# Patient Record
Sex: Female | Born: 1940
Health system: Southern US, Community
[De-identification: ages and names within clinical notes are randomized; demographics above are authoritative.]

## PROBLEM LIST (undated history)

## (undated) DIAGNOSIS — W5501XA Bitten by cat, initial encounter: Secondary | ICD-10-CM

## (undated) DIAGNOSIS — I4891 Unspecified atrial fibrillation: Secondary | ICD-10-CM

## (undated) DIAGNOSIS — G459 Transient cerebral ischemic attack, unspecified: Secondary | ICD-10-CM

## (undated) DIAGNOSIS — G210 Malignant neuroleptic syndrome: Secondary | ICD-10-CM

## (undated) DIAGNOSIS — S81851A Open bite, right lower leg, initial encounter: Secondary | ICD-10-CM

## (undated) DIAGNOSIS — L03119 Cellulitis of unspecified part of limb: Secondary | ICD-10-CM

## (undated) DIAGNOSIS — I10 Essential (primary) hypertension: Secondary | ICD-10-CM

## (undated) DIAGNOSIS — E7800 Pure hypercholesterolemia, unspecified: Secondary | ICD-10-CM

## (undated) HISTORY — DX: Open bite, right lower leg, initial encounter: S81.851A

## (undated) HISTORY — DX: Cellulitis of unspecified part of limb: L03.119

## (undated) HISTORY — DX: Open bite, right lower leg, initial encounter: W55.01XA

## (undated) HISTORY — DX: Essential (primary) hypertension: I10

---

## 2012-11-16 ENCOUNTER — Emergency Department: Payer: Self-pay | Admitting: Unknown Physician Specialty

## 2012-11-16 LAB — CBC WITH DIFFERENTIAL/PLATELET
Basophil #: 0 10*3/uL (ref 0.0–0.1)
Eosinophil %: 0.7 %
HGB: 13.3 g/dL (ref 12.0–16.0)
Lymphocyte #: 1.4 10*3/uL (ref 1.0–3.6)
Lymphocyte %: 12 %
MCHC: 33.5 g/dL (ref 32.0–36.0)
MCV: 92 fL (ref 80–100)
Monocyte %: 7.9 %
Neutrophil %: 79 %
RDW: 13.1 % (ref 11.5–14.5)

## 2012-11-16 LAB — COMPREHENSIVE METABOLIC PANEL WITH GFR
Albumin: 3.6 g/dL
Alkaline Phosphatase: 87 U/L
Anion Gap: 7
BUN: 11 mg/dL
Bilirubin,Total: 0.7 mg/dL
Calcium, Total: 9.4 mg/dL
Chloride: 104 mmol/L
Co2: 26 mmol/L
Creatinine: 0.55 mg/dL — ABNORMAL LOW
EGFR (African American): >60
EGFR (Non-African Amer.): >60
Glucose: 101 mg/dL — ABNORMAL HIGH
Osmolality: 273
Potassium: 3.5 mmol/L
SGOT(AST): 30 U/L
SGPT (ALT): 35 U/L
Sodium: 137 mmol/L
Total Protein: 8.5 g/dL — ABNORMAL HIGH

## 2012-11-22 LAB — CULTURE, BLOOD (SINGLE)

## 2014-12-07 DIAGNOSIS — L03119 Cellulitis of unspecified part of limb: Secondary | ICD-10-CM

## 2014-12-07 HISTORY — DX: Cellulitis of unspecified part of limb: L03.119

## 2015-03-21 ENCOUNTER — Other Ambulatory Visit: Payer: Self-pay | Admitting: Family Medicine

## 2015-03-21 ENCOUNTER — Ambulatory Visit
Admission: RE | Admit: 2015-03-21 | Discharge: 2015-03-21 | Disposition: A | Discharge status: Home / Self Care | Payer: Medicare PPO | Source: Ambulatory Visit | Attending: Family Medicine | Admitting: Family Medicine

## 2015-03-21 DIAGNOSIS — M25562 Pain in left knee: Secondary | ICD-10-CM

## 2015-03-21 IMAGING — CR DG KNEE COMPLETE 4+V*L*
1 series · 5 of 5 positions shown · non-contrast
Comparison: None.

CLINICAL DATA: Left knee pain for more than a year.

EXAM:
LEFT KNEE - COMPLETE 4+ VIEW

[Series 1: ap · 0.17mm/px · 5 of 5 slices shown]
[im 1/5]
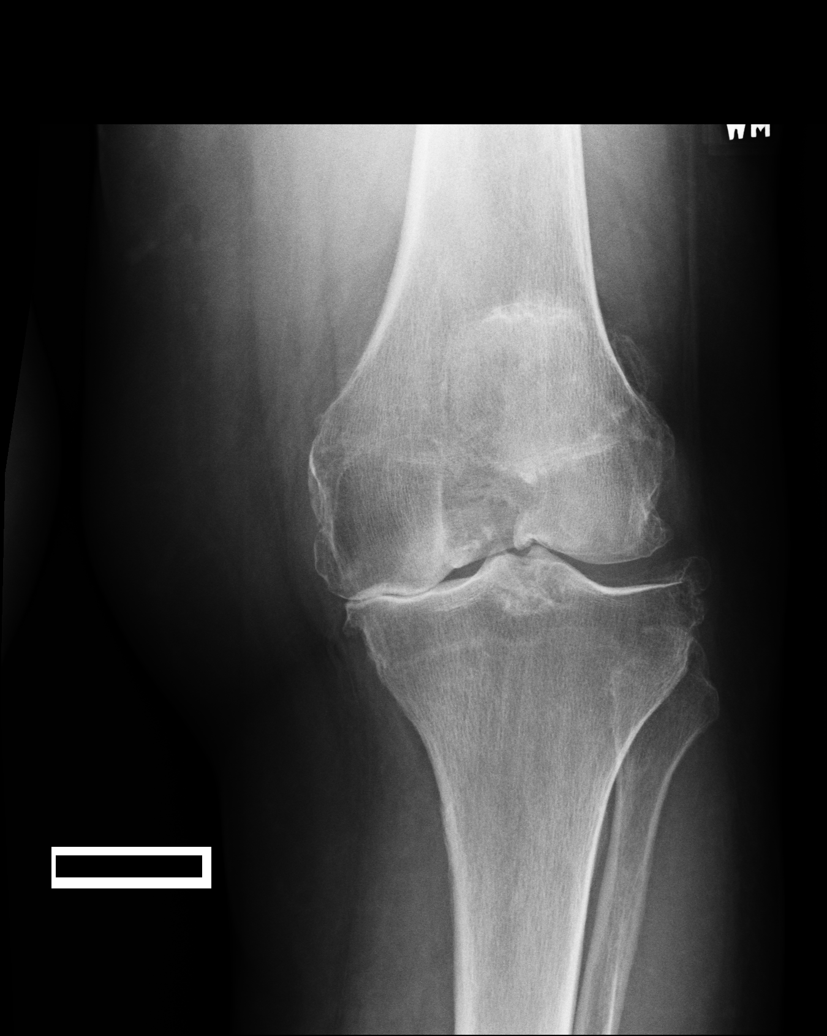
[im 2/5]
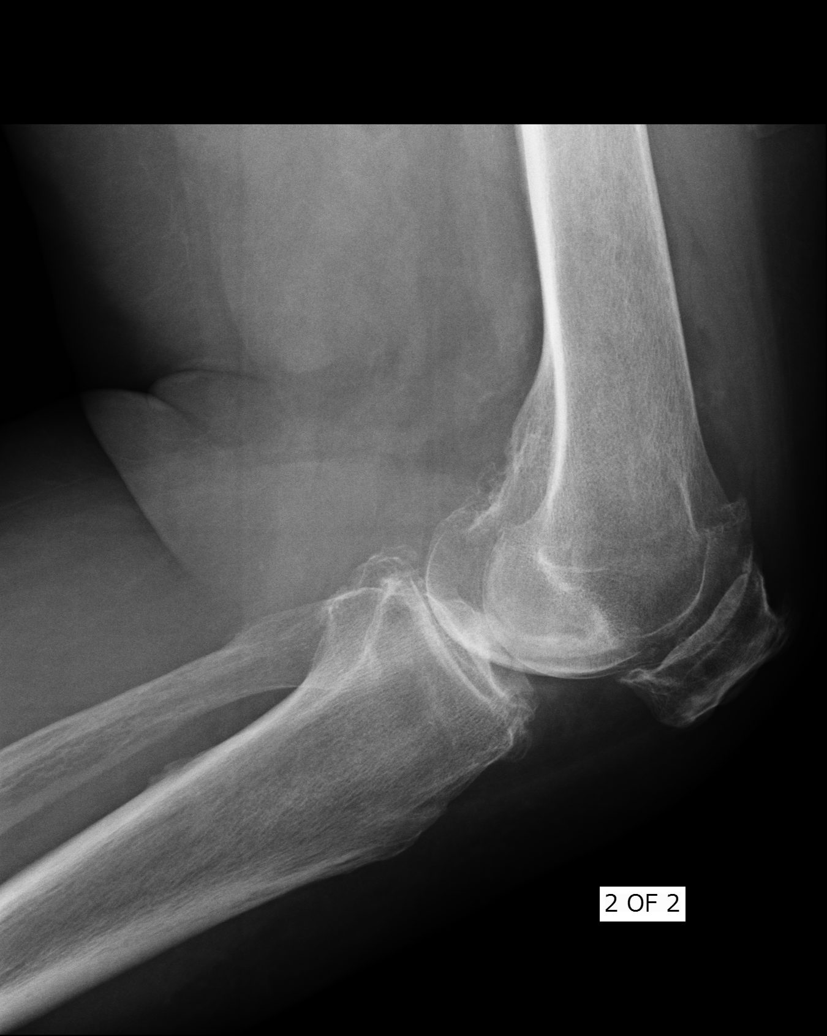
[im 3/5]
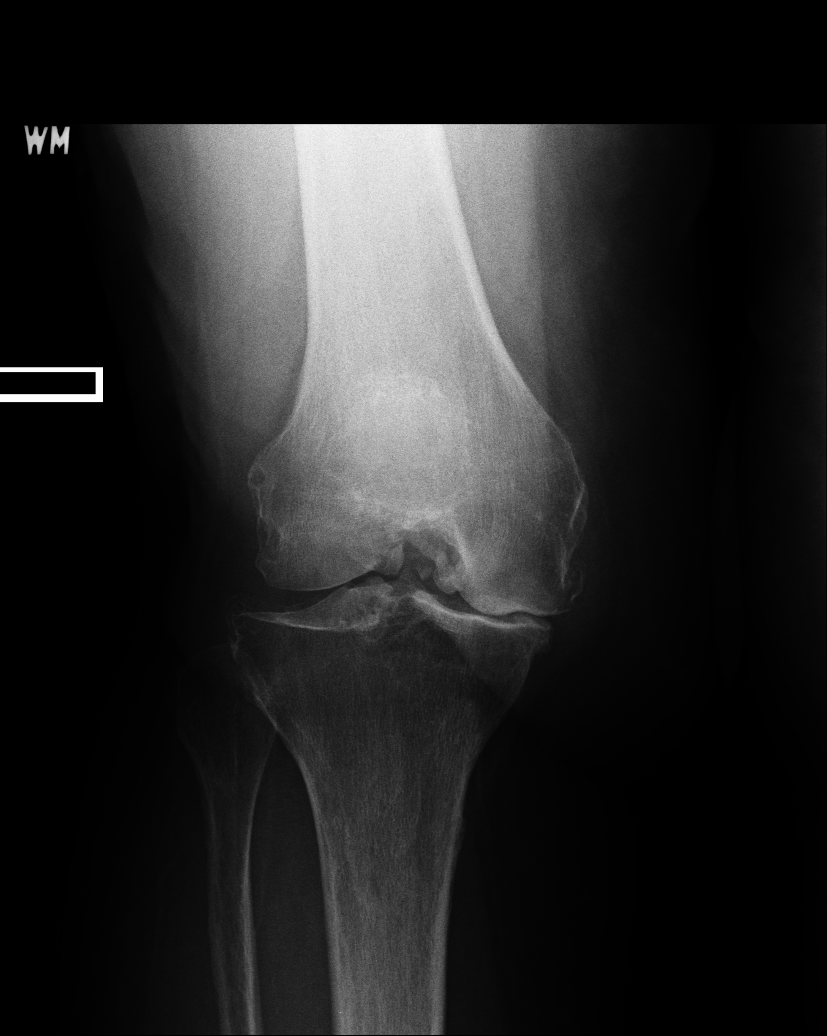
[im 4/5]
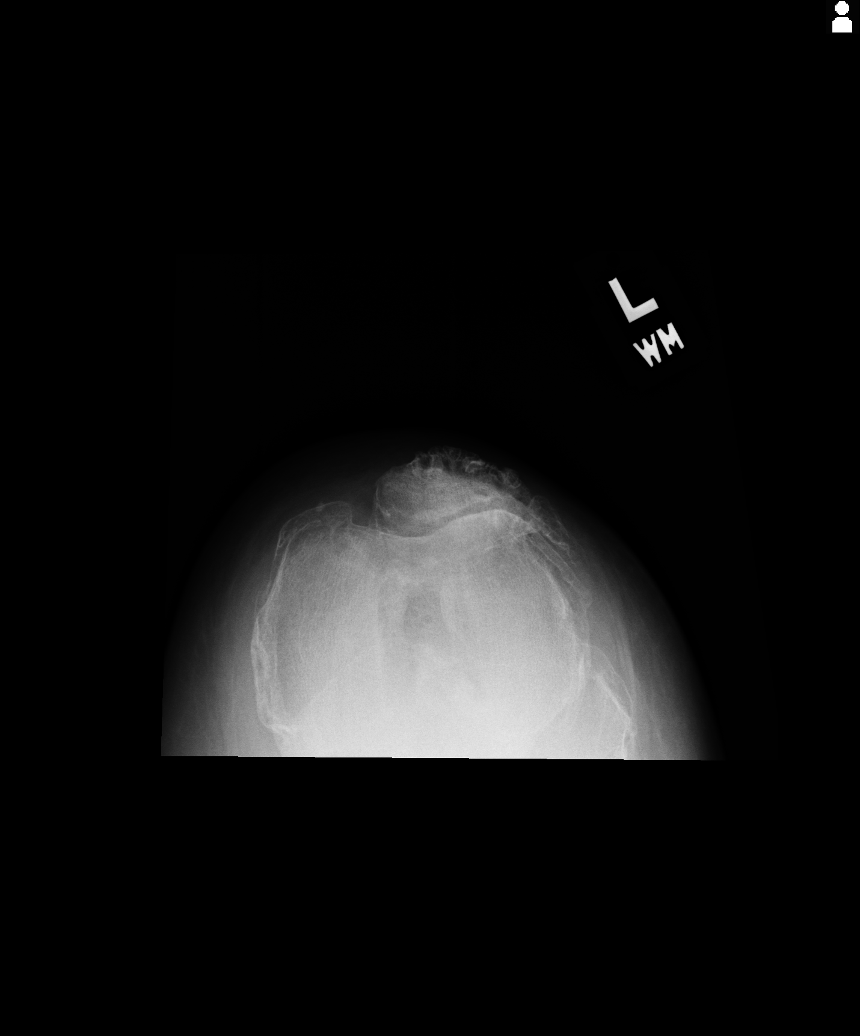
[im 5/5]
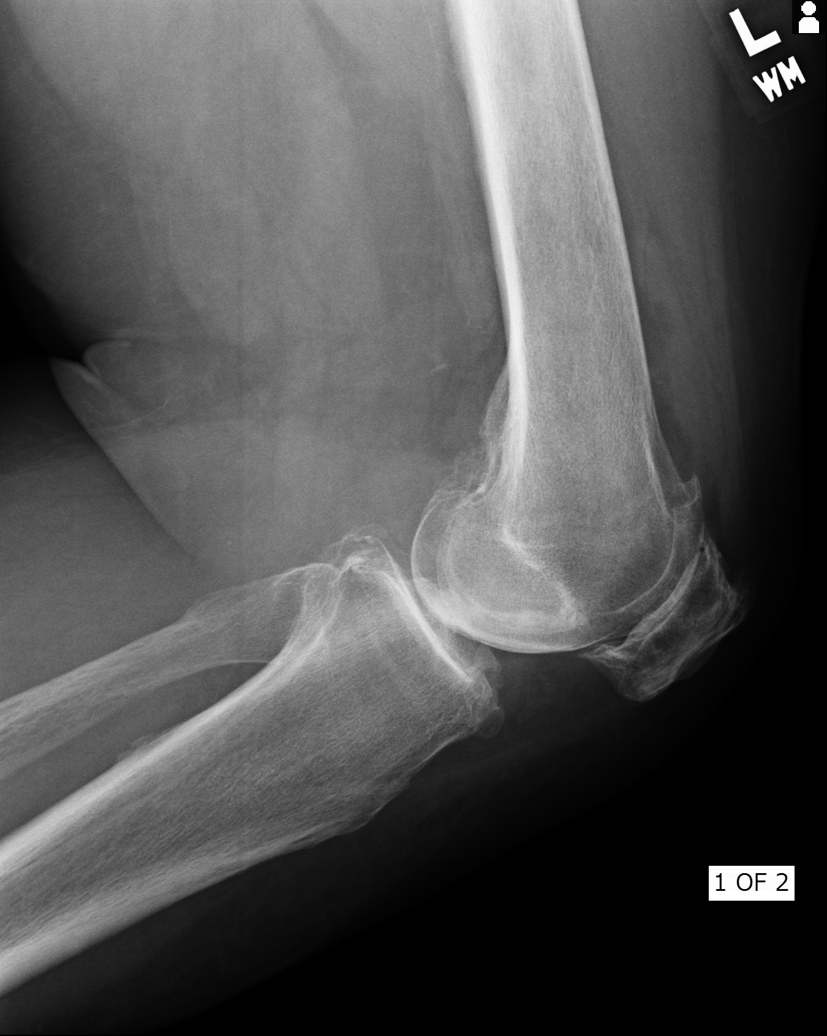

[5 of 5 positions shown; findings below may reference images not displayed]

FINDINGS: No acute fracture or dislocation. Tricompartmental osteoarthritis of
the left knee most severe in the medial femorotibial compartment
with severe joint space narrowing with a bone-on-bone appearance.
Small joint effusion. No lytic or sclerotic osseous lesion.
IMPRESSION: Tricompartmental osteoarthritis of the left knee most severe in the
medial femorotibial compartment.

## 2015-04-11 ENCOUNTER — Ambulatory Visit (INDEPENDENT_AMBULATORY_CARE_PROVIDER_SITE_OTHER): Payer: Medicare PPO | Admitting: Family Medicine

## 2015-04-11 ENCOUNTER — Encounter: Payer: Self-pay | Admitting: Family Medicine

## 2015-04-11 VITALS — BP 170/90 | HR 71 | Temp 98.7°F | Ht 65.6 in | Wt 213.6 lb

## 2015-04-11 DIAGNOSIS — M150 Primary generalized (osteo)arthritis: Secondary | ICD-10-CM | POA: Diagnosis not present

## 2015-04-11 DIAGNOSIS — I10 Essential (primary) hypertension: Secondary | ICD-10-CM | POA: Insufficient documentation

## 2015-04-11 DIAGNOSIS — M159 Polyosteoarthritis, unspecified: Secondary | ICD-10-CM

## 2015-04-11 DIAGNOSIS — M1990 Unspecified osteoarthritis, unspecified site: Secondary | ICD-10-CM | POA: Insufficient documentation

## 2015-04-11 MED ORDER — AMLODIPINE BESYLATE 2.5 MG PO TABS: 2.5000 mg | ORAL_TABLET | Freq: Every day | ORAL | Status: DC

## 2015-04-11 NOTE — Progress Notes (Signed)
BP 178/103 mmHg  Pulse 71  Temp(Src) 98.7 F (37.1 C)  Ht 5' 5.6" (1.666 m)  Wt 213 lb 9.6 oz (96.888 kg)  BMI 34.91 kg/m2  SpO2 97%  LMP  (LMP Unknown)   Subjective:    Patient ID: [name], female    DOB: 1941/09/11, 74 y.o.   MRN: [id number]  HPI: [name] is a 74 y.o. female presenting on 04/11/2015 for Hypertension and Edema  HYPERTENSION Hypertension status: uncontrolled Satisfied with current treatment? yes Duration of hypertension: chronic BP monitoring frequency:  a few times a day BP range: 109N-235T [id number]/27C-62B diastolic BP medication side effects:  no Medication compliance: good compliance Previous BP meds:none, ramipril, spironalactone, tekturna, valsartan, valsartan-HCTZ and verapamil Aspirin: no Recurrent headaches: no Visual changes: no Palpitations: no Dyspnea: no Chest pain: no Lower extremity edema: no Dizzy/lightheaded: no  EDEMA OF RING FINGER- hasn't noticed much of a difference with the diclofenac. Hasn't been in to see the orthopedist. Has ring off her finger and swelling has gotten better. Still having some soreness in the morning and having pain in her hands and her knees  Relevant past medical, surgical, family and social history reviewed and updated as indicated. Interim medical history since our last visit reviewed. Allergies and medications reviewed and updated.  Current Outpatient Prescriptions on File Prior to Visit  Medication Sig  . lisinopril-hydrochlorothiazide (PRINZIDE,ZESTORETIC) 20-25 MG per tablet Take 1 tablet by mouth daily.  . diclofenac sodium (VOLTAREN) 1 % GEL Apply topically 4 (four) times daily.   No current facility-administered medications on file prior to visit.    Review of Systems  Constitutional: Negative.   Respiratory: Negative.   Cardiovascular: Negative.   Gastrointestinal: Negative.   Musculoskeletal: Negative.   Skin: Negative.     Per HPI unless specifically indicated above      Objective:    BP 178/103 mmHg  Pulse 71  Temp(Src) 98.7 F (37.1 C)  Ht 5' 5.6" (1.666 m)  Wt 213 lb 9.6 oz (96.888 kg)  BMI 34.91 kg/m2  SpO2 97%  LMP  (LMP Unknown) Repeat BP: 170/90 Wt Readings from Last 3 Encounters:  04/11/15 213 lb 9.6 oz (96.888 kg)  03/21/15 204 lb (92.534 kg)  03/21/15 204 lb (92.534 kg)    Physical Exam  Constitutional: She is oriented to person, place, and time. She appears well-developed and well-nourished.  HENT:  Head: Normocephalic and atraumatic.  Eyes: Conjunctivae and EOM are normal. Pupils are equal, round, and reactive to light.  Neck: Normal range of motion. Neck supple.  Cardiovascular: Normal rate, regular rhythm, normal heart sounds and intact distal pulses.  Exam reveals no gallop and no friction rub.   No murmur heard. Pulmonary/Chest: Effort normal and breath sounds normal. No respiratory distress. She has no wheezes. She has no rales. She exhibits no tenderness.  Musculoskeletal:  Some mild tenderness over the MCP joint of the 4th finger on the L hand  Neurological: She is alert and oriented to person, place, and time.  Skin: Skin is warm and dry.  Psychiatric: She has a normal mood and affect. Her behavior is normal. Judgment normal.  Nursing note and vitals reviewed.     Assessment & Plan:   Problem List Items Addressed This Visit    HTN (hypertension) - Primary    Still very elevated. Seems to be better at home, but unclear if this is accurate as she hasn't brought in her cuff. Will start her on low dose amlodipine  and have her come back in 2 weeks for recheck. If she gets dizzy on that medicine, she will stop it and call.       Relevant Medications   amLODipine (NORVASC) 2.5 MG tablet   Osteoarthritis    Continue voltaren gel as needed for her arthritis. Referral to orthopedist made previously, which she will keep. Continue to monitor.            Follow up plan: Return in about 2 weeks (around  04/25/2015).

## 2015-04-11 NOTE — Assessment & Plan Note (Signed)
Still very elevated. Seems to be better at home, but unclear if this is accurate as she hasn't brought in her cuff. Will start her on low dose amlodipine and have her come back in 2 weeks for recheck. If she gets dizzy on that medicine, she will stop it and call.

## 2015-04-11 NOTE — Assessment & Plan Note (Signed)
Continue voltaren gel as needed for her arthritis. Referral to orthopedist made previously, which she will keep. Continue to monitor.

## 2015-04-11 NOTE — Patient Instructions (Addendum)
-  Start taking the amlodipine with the lisinopril-hctz. If you start feeling dizzy, stop the amlodipine and call me.  -Stop eating so much soup because of the sodium  Hypertension Hypertension is another name for high blood pressure. High blood pressure forces your heart to work harder to pump blood. A blood pressure reading has two numbers, which includes a higher number over a lower number (example: 110/72). HOME CARE   Have your blood pressure rechecked by your doctor.  Only take medicine as told by your doctor. Follow the directions carefully. The medicine does not work as well if you skip doses. Skipping doses also puts you at risk for problems.  Do not smoke.  Monitor your blood pressure at home as told by your doctor. GET HELP IF:  You think you are having a reaction to the medicine you are taking.  You have repeat headaches or feel dizzy.  You have puffiness (swelling) in your ankles.  You have trouble with your vision. GET HELP RIGHT AWAY IF:   You get a very bad headache and are confused.  You feel weak, numb, or faint.  You get chest or belly (abdominal) pain.  You throw up (vomit).  You cannot breathe very well. MAKE SURE YOU:   Understand these instructions.  Will watch your condition.  Will get help right away if you are not doing well or get worse. Document Released: 04/08/2008 Document Revised: 10/26/2013 Document Reviewed: 08/13/2013 [facility] Patient Information 2015 [place], [place]. This information is not intended to replace advice given to you by your health care provider. Make sure you discuss any questions you have with your health care provider.

## 2015-04-21 ENCOUNTER — Telehealth: Payer: Self-pay

## 2015-04-21 NOTE — Telephone Encounter (Signed)
Patient takes her "salt" pill in the morning, the last couple of days she has been groggy and having a headache.  Drinking "more than one" sprite has helped.  She thinks it may be the pill and doesn't want to take it anymore

## 2015-04-21 NOTE — Telephone Encounter (Signed)
Called patient and told her to keep taking her blood pressure medicine and we'll recheck her blood pressure on Tuesday at her appointment.

## 2015-04-25 ENCOUNTER — Encounter: Payer: Self-pay | Admitting: Family Medicine

## 2015-04-25 ENCOUNTER — Ambulatory Visit (INDEPENDENT_AMBULATORY_CARE_PROVIDER_SITE_OTHER): Payer: Medicare PPO | Admitting: Family Medicine

## 2015-04-25 VITALS — BP 184/100 | HR 67 | Temp 98.6°F | Ht 66.2 in | Wt 201.0 lb

## 2015-04-25 DIAGNOSIS — M79645 Pain in left finger(s): Secondary | ICD-10-CM | POA: Diagnosis not present

## 2015-04-25 DIAGNOSIS — I10 Essential (primary) hypertension: Secondary | ICD-10-CM

## 2015-04-25 NOTE — Assessment & Plan Note (Signed)
Appears to be some arthritis. Will use the volterin gel on her finger

## 2015-04-25 NOTE — Assessment & Plan Note (Signed)
Still not under good control. Patient has low numbers at home in the 100s, and has had some symptomatic hypotension on higher doses previously. Very labile BP per patient readings at home. Will refer to cardiology for further evaluation and recommendations regarding BP treatment.

## 2015-04-25 NOTE — Patient Instructions (Signed)

## 2015-04-25 NOTE — Progress Notes (Signed)
BP 184/100 mmHg  Pulse 67  Temp(Src) 98.6 F (37 C)  Ht 5' 6.2" (1.681 m)  Wt 201 lb (91.173 kg)  BMI 32.26 kg/m2  SpO2 99%  LMP  (Approximate)   Subjective:    Patient ID: [name], female    DOB: 02-20-1941, 74 y.o.   MRN: [id number]  HPI: [name] is a 74 y.o. female  Chief Complaint  Patient presents with  . Hypertension   HYPERTENSION- feeling like her stomach has been off and she has not been feeling all that well.  Hypertension status: uncontrolled Satisfied with current treatment? no Duration of hypertension: chronic BP monitoring frequency:  a few times a day BP range:  BP medication side effects:  no Medication compliance: good compliance Aspirin: no Recurrent headaches: yes Visual changes: no Palpitations: no Dyspnea: no Chest pain: no Lower extremity edema: no Dizzy/lightheaded: no  Had been feeling sick for a couple of days after she ate breakfast- but has been feeling better for about 2-3 days and now is feeling fine.   Relevant past medical, surgical, family and social history reviewed and updated as indicated. Interim medical history since our last visit reviewed. Allergies and medications reviewed and updated.  Review of Systems  Constitutional: Negative.   Respiratory: Negative.   Cardiovascular: Negative.   Gastrointestinal: Positive for nausea. Negative for vomiting, abdominal pain, diarrhea, constipation, blood in stool and anal bleeding.  Musculoskeletal: Negative.   Skin: Negative.   Psychiatric/Behavioral: Negative.    Per HPI unless specifically indicated above    Objective:    BP 184/100 mmHg  Pulse 67  Temp(Src) 98.6 F (37 C)  Ht 5' 6.2" (1.681 m)  Wt 201 lb (91.173 kg)  BMI 32.26 kg/m2  SpO2 99%  LMP  (Approximate)  Wt Readings from Last 3 Encounters:  04/25/15 201 lb (91.173 kg)  04/11/15 213 lb 9.6 oz (96.888 kg)  03/21/15 204 lb (92.534 kg)    Physical Exam  Constitutional: She is oriented to person, place,  and time. She appears well-developed and well-nourished. No distress.  HENT:  Head: Normocephalic and atraumatic.  Eyes: Conjunctivae and EOM are normal. Pupils are equal, round, and reactive to light. Right eye exhibits no discharge. Left eye exhibits no discharge. No scleral icterus.  Cardiovascular: Normal rate, regular rhythm and normal heart sounds.  Exam reveals no gallop and no friction rub.   No murmur heard. Pulmonary/Chest: Effort normal and breath sounds normal. No respiratory distress. She has no wheezes. She has no rales. She exhibits no tenderness.  Musculoskeletal:  Some slight swelling over the L ring finger  Neurological: She is alert and oriented to person, place, and time.  Skin: Skin is warm and dry. No rash noted. She is not diaphoretic. No erythema. No pallor.  Psychiatric: She has a normal mood and affect. Her behavior is normal. Judgment normal.  Nursing note and vitals reviewed.       Assessment & Plan:   Problem List Items Addressed This Visit      Cardiovascular and Mediastinum   HTN (hypertension) - Primary    Still not under good control. Patient has low numbers at home in the 100s, and has had some symptomatic hypotension on higher doses previously. Very labile BP per patient readings at home. Will refer to cardiology for further evaluation and recommendations regarding BP treatment.       Relevant Orders   Ambulatory referral to Cardiology     Other   Finger pain,  left    Appears to be some arthritis. Will use the volterin gel on her finger          Follow up plan: Return in about 3 months (around 07/26/2015).

## 2015-04-26 ENCOUNTER — Telehealth: Payer: Self-pay

## 2015-04-26 NOTE — Telephone Encounter (Signed)
Patient called and wanted to know if she had to continue to take both of her blood pressure medications, since she is going to Cardiology, but not until August. She states that the newer one makes her sick. After reading over Dr.Johnson's note I advised her to continue both medications until she is seen at cardiology since her BP has been so high in the office.

## 2015-06-15 ENCOUNTER — Ambulatory Visit (INDEPENDENT_AMBULATORY_CARE_PROVIDER_SITE_OTHER): Payer: Medicare PPO | Admitting: Cardiovascular Disease

## 2015-06-15 ENCOUNTER — Encounter: Payer: Self-pay | Admitting: Cardiovascular Disease

## 2015-06-15 VITALS — BP 150/80 | HR 82 | Ht 67.0 in | Wt 197.2 lb

## 2015-06-15 DIAGNOSIS — M7989 Other specified soft tissue disorders: Secondary | ICD-10-CM | POA: Diagnosis not present

## 2015-06-15 DIAGNOSIS — R21 Rash and other nonspecific skin eruption: Secondary | ICD-10-CM | POA: Diagnosis not present

## 2015-06-15 DIAGNOSIS — I4891 Unspecified atrial fibrillation: Secondary | ICD-10-CM | POA: Diagnosis not present

## 2015-06-15 DIAGNOSIS — I10 Essential (primary) hypertension: Secondary | ICD-10-CM | POA: Diagnosis not present

## 2015-06-15 MED ORDER — RIVAROXABAN 20 MG PO TABS: 20.0000 mg | ORAL_TABLET | Freq: Every day | ORAL | Status: DC

## 2015-06-15 MED ORDER — METOPROLOL SUCCINATE ER 50 MG PO TB24: 50.0000 mg | ORAL_TABLET | Freq: Every day | ORAL | Status: DC

## 2015-06-15 MED ORDER — LOSARTAN POTASSIUM 100 MG PO TABS: 100.0000 mg | ORAL_TABLET | Freq: Every day | ORAL | Status: DC

## 2015-06-15 NOTE — Assessment & Plan Note (Signed)
She reports worsening leg swelling. Unclear if this is from atrial fibrillation and diastolic heart failure. She does have significant by mouth fluid intake. Unable to exclude worsening swelling from amlodipine. Recommended she hold amlodipine for now, try to decrease her fluid intake We will hold off on Lasix for now. She denies any shortness of breath with exertion

## 2015-06-15 NOTE — Patient Instructions (Addendum)
Please stop the lisinopril HCT Start losartan one pill a day for blood pressure  Stop the amlodipine Start metoprolol 50 mg once a day   Start xarelto one a day for atrial fibrillation (blood thinner)  We will schedule you for an echocardiogram for atrial fibrillation    Please call us if you have new issues that need to be addressed before your next appt.  Your physician wants you to follow-up in: 1 month.  Echocardiogram An echocardiogram, or echocardiography, uses sound waves (ultrasound) to produce an image of your heart. The echocardiogram is simple, painless, obtained within a short period of time, and offers valuable information to your health care provider. The images from an echocardiogram can provide information such as:  Evidence of coronary artery disease (CAD).  Heart size.  Heart muscle function.  Heart valve function.  Aneurysm detection.  Evidence of a past heart attack.  Fluid buildup around the heart.  Heart muscle thickening.  Assess heart valve function. LET [facility] CARE PROVIDER KNOW ABOUT:  Any allergies you have.  All medicines you are taking, including vitamins, herbs, eye drops, creams, and over-the-counter medicines.  Previous problems you or members of your family have had with the use of anesthetics.  Any blood disorders you have.  Previous surgeries you have had.  Medical conditions you have.  Possibility of pregnancy, if this applies. BEFORE THE PROCEDURE  No special preparation is needed. Eat and drink normally.  PROCEDURE   In order to produce an image of your heart, gel will be applied to your chest and a wand-like tool (transducer) will be moved over your chest. The gel will help transmit the sound waves from the transducer. The sound waves will harmlessly bounce off your heart to allow the heart images to be captured in real-time motion. These images will then be recorded.  You may need an IV to receive a medicine that  improves the quality of the pictures. AFTER THE PROCEDURE You may return to your normal schedule including diet, activities, and medicines, unless your health care provider tells you otherwise. Document Released: 10/18/2000 Document Revised: 03/07/2014 Document Reviewed: 06/28/2013 [facility] Patient Information 2015 [place], [place]. This information is not intended to replace advice given to you by your health care provider. Make sure you discuss any questions you have with your health care provider.

## 2015-06-15 NOTE — Progress Notes (Signed)
  Primary care physician:Dr. Nelle Don  HPI  This is a pleasant 74 year old female who was referred for evaluation of labile hypertension.  No Known Allergies   Current Outpatient Prescriptions on File Prior to Visit  Medication Sig Dispense Refill  . amLODipine (NORVASC) 2.5 MG tablet Take 1 tablet (2.5 mg total) by mouth daily. 1 tablet 6  . diclofenac sodium (VOLTAREN) 1 % GEL Apply topically 4 (four) times daily.    Marland Kitchen lisinopril-hydrochlorothiazide (PRINZIDE,ZESTORETIC) 20-25 MG per tablet Take 1 tablet by mouth daily.     No current facility-administered medications on file prior to visit.     Past Medical History  Diagnosis Date  . Hypertension   . Cellulitis of lower leg 12/07/2014  . Cat bite of right lower leg [id number]     History reviewed. No pertinent past surgical history.   Family History  Problem Relation Age of Onset  . Hypertension Mother   . Alcohol abuse Father   . Heart disease Father   . Hypertension Father   . Hyperlipidemia Sister   . Hypertension Sister   . Alcohol abuse Brother   . Hypertension Brother      Social History   Social History  . Marital Status: Married    Spouse Name: N/A  . Number of Children: N/A  . Years of Education: N/A   Occupational History  . Not on file.   Social History Main Topics  . Smoking status: Former Smoker    Quit date: 11/04/1978  . Smokeless tobacco: Never Used  . Alcohol Use: No  . Drug Use: No  . Sexual Activity: No   Other Topics Concern  . Not on file   Social History Narrative     ROS   PHYSICAL EXAM   Ht 5\' 7"  (1.702 m)  Wt 197 lb 4 oz (89.472 kg)  BMI 30.89 kg/m2  LMP  (Approximate)   EKG:   ASSESSMENT AND PLAN

## 2015-06-15 NOTE — Assessment & Plan Note (Signed)
Medication changes as above. We will hold the lisinopril HCTZ and amlodipine. We will start losartan and metoprolol Recommended she monitor her blood pressure at home

## 2015-06-15 NOTE — Progress Notes (Signed)
Patient ID: [name], female    DOB: 27-Nov-1940, 74 y.o.   MRN: [id number]  HPI Comments: Ms. Soyars is a 74 year old woman, patient of Dr. Wynetta Emery with history of labile blood pressure who presents for evaluation of her hypertension.  On her visit today, she reports that for the past several weeks she has had nausea, upset stomach. She has been drinking 6 sprites in the morning in addition to her coffee. This tends to settle her stomach. She does report some significant itching of her arms and legs. She blames this on a cat. Arms are red, excoriated as are the legs below the knees, worse on the right. She feels it is her "salt pill" causing her problems. This is the lisinopril HCT.  She denies any palpitations, shortness of breath, chest discomfort.  EKG on today's visit shows atrial fibrillation with ventricular rate 82 bpm, right bundle branch block.  This is new to her, never had EKG in the past  In general she tries to avoid doctors, does not like to take medications. Prefers to do everything with her diet.  Remote history of smoking, not for 30 years Husband died several years ago No regular exercise program Reports her blood pressure is labile, sometimes low at home      No Known Allergies  Outpatient Encounter Prescriptions as of 06/15/2015  Medication Sig  . amLODipine (NORVASC) 2.5 MG tablet Take 1 tablet (2.5 mg total) by mouth daily.  Marland Kitchen  lisinopril-hydrochlorothiazide (PRINZIDE,ZESTORETIC) 20-25 MG per tablet Take 1 tablet by mouth daily.    Past Medical History  Diagnosis Date  . Hypertension   . Cellulitis of lower leg 12/07/2014  . Cat bite of right lower leg [id number]    History reviewed. No pertinent past surgical history.  Social History  reports that she quit smoking about 36 years ago. She has never used smokeless tobacco. She reports that she does not drink alcohol or use illicit drugs.  Family History family history includes Alcohol abuse in her  brother and father; Heart disease in her father; Hyperlipidemia in her sister; Hypertension in her brother, father, mother, and sister.   Review of Systems  Constitutional: Negative.   Respiratory: Negative.   Cardiovascular: Positive for leg swelling.  Gastrointestinal: Positive for nausea.  Musculoskeletal: Negative.   Skin: Positive for rash.  Neurological: Negative.   Hematological: Negative.   Psychiatric/Behavioral: Negative.   All other systems reviewed and are negative.   BP 150/80 mmHg  Pulse 82  Ht 5\' 7"  (1.702 m)  Wt 197 lb 4 oz (89.472 kg)  BMI 30.89 kg/m2  LMP  (Approximate)  Physical Exam  Constitutional: She is oriented to person, place, and time. She appears well-developed and well-nourished.  HENT:  Head: Normocephalic.  Nose: Nose normal.  Mouth/Throat: Oropharynx is clear and moist.  Eyes: Conjunctivae are normal. Pupils are equal, round, and reactive to light.  Neck: Normal range of motion. Neck supple. No JVD present.  Cardiovascular: Normal rate, regular rhythm, normal heart sounds and intact distal pulses.  Exam reveals no gallop and no friction rub.   No murmur heard. Pulmonary/Chest: Effort normal and breath sounds normal. No respiratory distress. She has no wheezes. She has no rales. She exhibits no tenderness.  Abdominal: Soft. Bowel sounds are normal. She exhibits no distension. There is no tenderness.  Musculoskeletal: Normal range of motion. She exhibits no edema or tenderness.  Lymphadenopathy:    She has no cervical adenopathy.  Neurological: She is alert  and oriented to person, place, and time. Coordination normal.  Skin: Skin is warm and dry. No rash noted. No erythema.  Psychiatric: She has a normal mood and affect. Her behavior is normal. Judgment and thought content normal.

## 2015-06-15 NOTE — Assessment & Plan Note (Signed)
EKG on today's visit documenting atrial fibrillation Ventricular rate not particularly elevated but we will add metoprolol succinate 50 mg daily  Also recommended she start anticoagulation with  Xarelto 20 mg daily  The hope would be for one month of anticoagulation and consider an attempt to restore normal sinus rhythm . Unclear how long she has been in atrial fibrillation . Echocardiogram has been ordered to rule out structural heart disease, valve regurgitation and to estimate size of left atrium as well as right heart pressures

## 2015-06-15 NOTE — Assessment & Plan Note (Signed)
Diffuse rash on her arms and legs. Unable to exclude sulfa allergy from HCTZ. She does report having worsening rash since she has been taking her "salt pill". Recommended she stop the lisinopril HCTZ. We will start losartan 100 mg daily

## 2015-06-23 ENCOUNTER — Ambulatory Visit (INDEPENDENT_AMBULATORY_CARE_PROVIDER_SITE_OTHER): Payer: Medicare PPO

## 2015-06-23 ENCOUNTER — Other Ambulatory Visit: Payer: Self-pay

## 2015-06-23 DIAGNOSIS — I4891 Unspecified atrial fibrillation: Secondary | ICD-10-CM

## 2015-07-27 ENCOUNTER — Ambulatory Visit (INDEPENDENT_AMBULATORY_CARE_PROVIDER_SITE_OTHER): Payer: Medicare PPO | Admitting: Family Medicine

## 2015-07-27 ENCOUNTER — Encounter: Payer: Self-pay | Admitting: Family Medicine

## 2015-07-27 VITALS — BP 168/80 | HR 73 | Temp 99.2°F | Wt 199.0 lb

## 2015-07-27 DIAGNOSIS — I4891 Unspecified atrial fibrillation: Secondary | ICD-10-CM

## 2015-07-27 DIAGNOSIS — R21 Rash and other nonspecific skin eruption: Secondary | ICD-10-CM | POA: Diagnosis not present

## 2015-07-27 DIAGNOSIS — I10 Essential (primary) hypertension: Secondary | ICD-10-CM

## 2015-07-27 NOTE — Assessment & Plan Note (Signed)
Significantly improve, but she notes that it comes back and goes away. Several cats at home. Potential environmental allergy or due to HCTZ, which has been stopped. Continue to monitor.

## 2015-07-27 NOTE — Patient Instructions (Signed)
Atrial Fibrillation  Atrial fibrillation is a condition that causes your heart to beat irregularly. It may also cause your heart to beat faster than normal. Atrial fibrillation can prevent your heart from pumping blood normally. It increases your risk of stroke and heart problems.  HOME CARE  · Take medications as told by your doctor.  · Only take medications that your doctor says are safe. Some medications can make the condition worse or happen again.  · If blood thinners were prescribed by your doctor, take them exactly as told. Too much can cause bleeding. Too little and you will not have the needed protection against stroke and other problems.  · Perform blood tests at home if told by your doctor.  · Perform blood tests exactly as told by your doctor.  · Do not drink alcohol.  · Do not drink beverages with caffeine such as coffee, soda, and some teas.  · Maintain a healthy weight.  · Do not use diet pills unless your doctor says they are safe. They may make heart problems worse.  · Follow diet instructions as told by your doctor.  · Exercise regularly as told by your doctor.  · Keep all follow-up appointments.  GET HELP IF:  · You notice a change in the speed, rhythm, or strength of your heartbeat.  · You suddenly begin peeing (urinating) more often.  · You get tired more easily when moving or exercising.  GET HELP RIGHT AWAY IF:   · You have chest or belly (abdominal) pain.  · You feel sick to your stomach (nauseous).  · You are short of breath.  · You suddenly have swollen feet and ankles.  · You feel dizzy.  · You face, arms, or legs feel numb or weak.  · There is a change in your vision or speech.  MAKE SURE YOU:   · Understand these instructions.  · Will watch your condition.  · Will get help right away if you are not doing well or get worse.  Document Released: 07/30/2008 Document Revised: 03/07/2014 Document Reviewed: 12/01/2012  [employer]® Patient Information ©2015 [employer]. This information is not  intended to replace advice given to you by your health care provider. Make sure you discuss any questions you have with your health care provider.

## 2015-07-27 NOTE — Assessment & Plan Note (Signed)
Discussed this with patient. She did not remember that Dr. Rockey Situ had diagnosed her with this. In fib again today. Continue to follow with Dr. Rockey Situ. Continue to monitor.

## 2015-07-27 NOTE — Assessment & Plan Note (Addendum)
BP continues to be labile. Has been doing well at home in the 120s-130s/80s, but here it is high again. Due to see Dr. Rockey Situ on 08/11/15- Will hold on changing any medicine and see what he recommends. Continue to monitor. Encouraged her to follow up with him. She will bring her monitor and log when she goes to see him.

## 2015-07-27 NOTE — Progress Notes (Signed)
BP 168/80 mmHg  Pulse 73  Temp(Src) 99.2 F (37.3 C)  Wt 199 lb (90.266 kg)  SpO2 98%  LMP  (Approximate)   Subjective:    Patient ID: [name], female    DOB: [birth year], 74 y.o.   MRN: [id number]  HPI: [name] is a 74 y.o. female  Chief Complaint  Patient presents with  . Hypertension   HYPERTENSION- saw Dr. Rockey Situ in August and diagnosed with A. Fib. He has been working on getting her BP under control.  Hypertension status: uncontrolled  Satisfied with current treatment? yes Duration of hypertension: chronic BP monitoring frequency:  a few times a day BP range: 110s-130s/80s BP medication side effects:  no Medication compliance: excellent compliance Aspirin: no Recurrent headaches: no Visual changes: no Palpitations: no Dyspnea: no Chest pain: no Lower extremity edema: no Dizzy/lightheaded: no  Has been feeling better on her stomach since she has come off her HCTZ. Has not been feeling nauseous or dizzy.   ATRIAL FIBRILLATION Atrial fibrillation status: stable Satisfied with current treatment: yes  Medication side effects:  no Medication compliance: excellent compliance Palpitations:  no Chest pain:  no Dyspnea on exertion:  no Orthopnea:  no Syncope:  no Edema:  yes Ventricular rate control: B-blocker Anti-coagulation: long acting   Relevant past medical, surgical, family and social history reviewed and updated as indicated. Interim medical history since our last visit reviewed. Allergies and medications reviewed and updated.  Review of Systems  Constitutional: Negative.   Respiratory: Negative.   Cardiovascular: Negative.   Musculoskeletal: Negative.   Skin: Negative.  Negative for color change, pallor, rash and wound.  Psychiatric/Behavioral: Negative.     Per HPI unless specifically indicated above     Objective:    BP 168/80 mmHg  Pulse 73  Temp(Src) 99.2 F (37.3 C)  Wt 199 lb (90.266 kg)  SpO2 98%  LMP  (Approximate)  Wt  Readings from Last 3 Encounters:  07/27/15 199 lb (90.266 kg)  06/15/15 197 lb 4 oz (89.472 kg)  04/25/15 201 lb (91.173 kg)    Physical Exam  Constitutional: She is oriented to person, place, and time. She appears well-developed and well-nourished. No distress.  HENT:  Head: Normocephalic and atraumatic.  Right Ear: Hearing normal.  Left Ear: Hearing normal.  Nose: Nose normal.  Eyes: Conjunctivae and lids are normal. Right eye exhibits no discharge. Left eye exhibits no discharge. No scleral icterus.  Cardiovascular: Normal rate, normal heart sounds and intact distal pulses.  An irregularly irregular rhythm present. Exam reveals no gallop and no friction rub.   No murmur heard. Pulmonary/Chest: Effort normal and breath sounds normal. No respiratory distress. She has no wheezes. She has no rales. She exhibits no tenderness.  Musculoskeletal: Normal range of motion.  1+ edema bilaterally, excoriation on R calf  Neurological: She is alert and oriented to person, place, and time.  Skin: Skin is warm, dry and intact. No rash noted. No erythema. No pallor.  Psychiatric: She has a normal mood and affect. Her speech is normal and behavior is normal. Judgment and thought content normal. Cognition and memory are normal.  Nursing note and vitals reviewed.     Assessment & Plan:   Problem List Items Addressed This Visit      Cardiovascular and Mediastinum   HTN (hypertension) - Primary    BP continues to be labile. Has been doing well at home in the 120s-130s/80s, but here it is high again. Due to see  Dr. Rockey Situ on 08/11/15- Will hold on changing any medicine and see what he recommends. Continue to monitor. Encouraged her to follow up with him. She will bring her monitor and log when she goes to see him.       Atrial fibrillation, unspecified    Discussed this with patient. She did not remember that Dr. Rockey Situ had diagnosed her with this. In fib again today. Continue to follow with Dr. Rockey Situ.  Continue to monitor.         Musculoskeletal and Integument   Rash    Significantly improve, but she notes that it comes back and goes away. Several cats at home. Potential environmental allergy or due to HCTZ, which has been stopped. Continue to monitor.           Follow up plan: Return in about 3 months (around 10/26/2015).

## 2015-08-11 ENCOUNTER — Encounter: Payer: Self-pay | Admitting: Cardiovascular Disease

## 2015-08-11 ENCOUNTER — Ambulatory Visit (INDEPENDENT_AMBULATORY_CARE_PROVIDER_SITE_OTHER): Payer: Medicare PPO | Admitting: Cardiovascular Disease

## 2015-08-11 VITALS — BP 170/74 | HR 65 | Ht 67.0 in | Wt 195.0 lb

## 2015-08-11 DIAGNOSIS — R21 Rash and other nonspecific skin eruption: Secondary | ICD-10-CM

## 2015-08-11 DIAGNOSIS — I10 Essential (primary) hypertension: Secondary | ICD-10-CM | POA: Diagnosis not present

## 2015-08-11 DIAGNOSIS — I4891 Unspecified atrial fibrillation: Secondary | ICD-10-CM | POA: Diagnosis not present

## 2015-08-11 DIAGNOSIS — M7989 Other specified soft tissue disorders: Secondary | ICD-10-CM

## 2015-08-11 NOTE — Assessment & Plan Note (Signed)
She remains in atrial fibrillation. Rate relatively well-controlled, now on anticoagulation Given her severely dilated left atrium and she is asymptomatic, will not try to restore normal sinus rhythm as she would be high risk of converting back to atrial fibrillation

## 2015-08-11 NOTE — Assessment & Plan Note (Signed)
Rash improved by holding lisinopril HCTZ I suspect she had a sulfa allergy to the HCTZ Tolerating losartan We'll try to avoid diuretics. Same problem could happen with Lasix

## 2015-08-11 NOTE — Assessment & Plan Note (Signed)
Leg swelling improved by holding amlodipine Still with chronic venous insufficiency and varicose veins. Recommended compression hose

## 2015-08-11 NOTE — Assessment & Plan Note (Signed)
Blood pressure is elevated today. She reports it is better at home but she does not know any of the numbers Recommended she closely monitor her blood pressure at home Ideal blood pressure range provided to her and she will monitor this and call our office if he continues to run high We'll avoid calcium channel blockers, HCTZ

## 2015-08-11 NOTE — Progress Notes (Signed)
Patient ID: [name], female    DOB: 1941-03-11, 74 y.o.   MRN: [id number]  HPI Comments: [name] is a 74 year old woman, patient of Dr. Wynetta Emery with history of labile blood pressure who presents for evaluation of her hypertension and atrial fibrillation.   In follow-up today, she reports that she is doing well. On her last clinic visit, several medication changes were made including holding amlodipine for leg edema, holding lisinopril HCTZ for rash. She was started on anticoagulation, xarelto She was started on losartan She reports that her rash has resolved, leg edema has significantly improved Still with chronic venous insufficiency, varicose veins She reports blood pressure at home is "within normal range" but she does not know the numbers. She did not bring her blood pressure cuff with her today Denies any significant shortness of breath or chest tightness  Echocardiogram done recently showing severely dilated left atrium, elevated right ventricular systolic pressure, normal ejection fraction.  EKG on today's visit shows atrial fibrillation with rate 65 bpm, right bundle branch block  Other past medical history Remote history of smoking, not for 30 years Husband died several years ago No regular exercise program Reports her blood pressure is labile, sometimes low at home      Allergies  Allergen Reactions  . Hctz [Hydrochlorothiazide] Nausea Only      Medication List       This list is accurate as of: 08/11/15  2:21 PM.  Always use your most recent med list.               losartan 100 MG tablet  Commonly known as:  COZAAR  Take 1 tablet (100 mg total) by mouth daily.     metoprolol succinate 50 MG 24 hr tablet  Commonly known as:  TOPROL-XL  Take 1 tablet (50 mg total) by mouth daily. Take with or immediately following a meal.     rivaroxaban 20 MG Tabs tablet  Commonly known as:  XARELTO  Take 1 tablet (20 mg total) by mouth daily with supper.         Past Medical History  Diagnosis Date  . Hypertension   . Cellulitis of lower leg 12/07/2014  . Cat bite of right lower leg [id number]    History reviewed. No pertinent past surgical history.  Social History  reports that she quit smoking about 36 years ago. She has never used smokeless tobacco. She reports that she does not drink alcohol or use illicit drugs.  Family History family history includes Alcohol abuse in her brother and father; Heart disease in her father; Hyperlipidemia in her sister; Hypertension in her brother, father, mother, and sister.   Review of Systems  Constitutional: Negative.   Respiratory: Negative.   Cardiovascular: Negative.   Gastrointestinal: Negative.   Musculoskeletal: Negative.   Skin: Negative.   Neurological: Negative.   Hematological: Negative.   Psychiatric/Behavioral: Negative.   All other systems reviewed and are negative.   BP 170/74 mmHg  Pulse 65  Ht 5\' 7"  (1.702 m)  Wt 195 lb (88.451 kg)  BMI 30.53 kg/m2  LMP  (Approximate) Blood pressure remained high even on recheck Physical Exam  Constitutional: She is oriented to person, place, and time. She appears well-developed and well-nourished.  HENT:  Head: Normocephalic.  Nose: Nose normal.  Mouth/Throat: Oropharynx is clear and moist.  Eyes: Conjunctivae are normal. Pupils are equal, round, and reactive to light.  Neck: Normal range of motion. Neck supple. No JVD present.  Cardiovascular: Normal rate, regular rhythm, normal heart sounds and intact distal pulses.  Exam reveals no gallop and no friction rub.   No murmur heard. Pulmonary/Chest: Effort normal and breath sounds normal. No respiratory distress. She has no wheezes. She has no rales. She exhibits no tenderness.  Abdominal: Soft. Bowel sounds are normal. She exhibits no distension. There is no tenderness.  Musculoskeletal: Normal range of motion. She exhibits no edema or tenderness.  Lymphadenopathy:    She has no  cervical adenopathy.  Neurological: She is alert and oriented to person, place, and time. Coordination normal.  Skin: Skin is warm and dry. No rash noted. No erythema.  Psychiatric: She has a normal mood and affect. Her behavior is normal. Judgment and thought content normal.

## 2015-08-11 NOTE — Patient Instructions (Signed)
You are doing well. No medication changes were made.  Goal blood pressure: 110 to 145 on the top 50 to 90 on the bottom  Please call us if you have new issues that need to be addressed before your next appt.  Your physician wants you to follow-up in: 6 months.  You will receive a reminder letter in the mail two months in advance. If you don't receive a letter, please call our office to schedule the follow-up appointment.

## 2015-10-26 ENCOUNTER — Ambulatory Visit: Payer: Medicare PPO | Admitting: Family Medicine

## 2015-11-07 ENCOUNTER — Ambulatory Visit (INDEPENDENT_AMBULATORY_CARE_PROVIDER_SITE_OTHER): Payer: Medicare PPO | Admitting: Family Medicine

## 2015-11-07 ENCOUNTER — Encounter: Payer: Self-pay | Admitting: Family Medicine

## 2015-11-07 VITALS — BP 180/80 | HR 71 | Temp 97.7°F | Ht 65.1 in | Wt 198.0 lb

## 2015-11-07 DIAGNOSIS — I10 Essential (primary) hypertension: Secondary | ICD-10-CM

## 2015-11-07 NOTE — Assessment & Plan Note (Signed)
Will monitor BP at home and bring it in next time. Will bring in her cuff next time. Continue to follow with Dr. Rockey Situ. BP stable at home, so if we increase her medicine, fear that she will go too low. Continue to monitor. Recheck 3 months.

## 2015-11-07 NOTE — Progress Notes (Signed)
BP 180/80 mmHg  Pulse 71  Temp(Src) 97.7 F (36.5 C)  Ht 5' 5.1" (1.654 m)  Wt 198 lb (89.812 kg)  BMI 32.83 kg/m2  SpO2 96%  LMP  (Approximate)   Subjective:    Patient ID: [name], female    DOB: [birth year], 75 y.o.   MRN: [id number]  HPI: [name] is a 75 y.o. female  Chief Complaint  Patient presents with  . Hypertension   Trigger finger has not been doing really well. Has been a bit more painful  HYPERTENSION- has been checking BP at home, occasionally runs high Hypertension status: stable- at home elevated here  Satisfied with current treatment? yes Duration of hypertension: chronic BP monitoring frequency:  a few times a week BP range: 120s/70s BP medication side effects:  no Medication compliance: excellent compliance Aspirin: no Recurrent headaches: no Visual changes: no Palpitations: no Dyspnea: no Chest pain: no Lower extremity edema: no Dizzy/lightheaded: no  Relevant past medical, surgical, family and social history reviewed and updated as indicated. Interim medical history since our last visit reviewed. Allergies and medications reviewed and updated.  Review of Systems  Constitutional: Negative.   HENT: Negative.   Respiratory: Negative.   Cardiovascular: Negative.   Neurological: Negative.   Psychiatric/Behavioral: Negative.     Per HPI unless specifically indicated above     Objective:    BP 180/80 mmHg  Pulse 71  Temp(Src) 97.7 F (36.5 C)  Ht 5' 5.1" (1.654 m)  Wt 198 lb (89.812 kg)  BMI 32.83 kg/m2  SpO2 96%  LMP  (Approximate)  Wt Readings from Last 3 Encounters:  11/07/15 198 lb (89.812 kg)  08/11/15 195 lb (88.451 kg)  07/27/15 199 lb (90.266 kg)    Physical Exam  Constitutional: She is oriented to person, place, and time. She appears well-developed and well-nourished. No distress.  HENT:  Head: Normocephalic and atraumatic.  Right Ear: Hearing normal.  Left Ear: Hearing normal.  Nose: Nose normal.  Eyes:  Conjunctivae and lids are normal. Right eye exhibits no discharge. Left eye exhibits no discharge. No scleral icterus.  Cardiovascular: Normal rate, normal heart sounds and intact distal pulses.  An irregularly irregular rhythm present. Exam reveals no gallop and no friction rub.   No murmur heard. Pulmonary/Chest: Effort normal and breath sounds normal. No respiratory distress. She has no wheezes. She has no rales. She exhibits no tenderness.  Musculoskeletal: Normal range of motion.  Neurological: She is alert and oriented to person, place, and time.  Skin: Skin is warm, dry and intact. No rash noted. No erythema. No pallor.  Psychiatric: She has a normal mood and affect. Her speech is normal and behavior is normal. Judgment and thought content normal. Cognition and memory are normal.  Nursing note and vitals reviewed.   Results for orders placed or performed in visit on 11/16/12  Culture, blood (single)  Result Value Ref Range   Micro Text Report         COMMENT                   NO GROWTH AEROBICALLY/ANAEROBICALLY IN 5 DAYS   ANTIBIOTIC                                                      Culture, blood (single)  Result  Value Ref Range   Micro Text Report         COMMENT                   NO GROWTH AEROBICALLY/ANAEROBICALLY IN 5 DAYS   ANTIBIOTIC                                                      CBC with Differential/Platelet  Result Value Ref Range   WBC 11.3 (H) 3.6-11.0 x10 3/mm 3   RBC 4.30 3.80-5.20 X10 6/mm 3   HGB 13.3 12.0-16.0 g/dL   HCT 39.6 35.0-47.0 %   MCV 92 80-100 fL   MCH 30.8 26.0-34.0 pg   MCHC 33.5 32.0-36.0 g/dL   RDW 13.1 11.5-14.5 %   Platelet 326 150-440 x10 3/mm 3   Neutrophil % 79.0 %   Lymphocyte % 12.0 %   Monocyte % 7.9 %   Eosinophil % 0.7 %   Basophil % 0.4 %   Neutrophil # 8.9 (H) 1.4-6.5 x10 3/mm 3   Lymphocyte # 1.4 1.0-3.6 x10 3/mm 3   Monocyte # 0.9 0.2-0.9 x10 3/mm    Eosinophil # 0.1 0.0-0.7 x10 3/mm 3   Basophil # 0.0  0.0-0.1 x10 3/mm 3  Comprehensive metabolic panel  Result Value Ref Range   Glucose 101 (H) 65-99 mg/dL   BUN 11 7-18 mg/dL   Creatinine 0.55 (L) 0.60-1.30 mg/dL   Sodium 137 136-145 mmol/L   Potassium 3.5 3.5-5.1 mmol/L   Chloride 104 98-107 mmol/L   Co2 26 21-32 mmol/L   Calcium, Total 9.4 8.5-10.1 mg/dL   SGOT(AST) 30 15-37 Unit/L   SGPT (ALT) 35 12-78 U/L   Alkaline Phosphatase 87 50-136 Unit/L   Albumin 3.6 3.4-5.0 g/dL   Total Protein 8.5 (H) 6.4-8.2 g/dL   Bilirubin,Total 0.7 0.2-1.0 mg/dL   Osmolality 273 275-301   Anion Gap 7 7-16   EGFR (African American) >60    EGFR (Non-African Amer.) >60       Assessment & Plan:   Problem List Items Addressed This Visit      Cardiovascular and Mediastinum   HTN (hypertension) - Primary    Will monitor BP at home and bring it in next time. Will bring in her cuff next time. Continue to follow with Dr. Rockey Situ. BP stable at home, so if we increase her medicine, fear that she will go too low. Continue to monitor. Recheck 3 months.           Follow up plan: Return in about 3 months (around 02/05/2016) for Follow up BP.

## 2015-12-22 ENCOUNTER — Telehealth: Payer: Self-pay

## 2015-12-22 NOTE — Telephone Encounter (Signed)
Patient called, she would like you to give her a call. I tried to see what she needed and all she would say was that she needed to ask you a question.

## 2015-12-22 NOTE — Telephone Encounter (Signed)
Is going on life-line screening on Monday. Is going to have [employer] come over on Monday. She wondered what a test was where you drink something and go to sleep. Unclear to what she is referring to. Will check [employer] paperwork if they send it over.

## 2016-01-10 ENCOUNTER — Other Ambulatory Visit: Payer: Self-pay | Admitting: Cardiovascular Disease

## 2016-02-05 ENCOUNTER — Encounter: Payer: Self-pay | Admitting: Family Medicine

## 2016-02-05 ENCOUNTER — Ambulatory Visit (INDEPENDENT_AMBULATORY_CARE_PROVIDER_SITE_OTHER): Payer: Medicare PPO | Admitting: Family Medicine

## 2016-02-05 ENCOUNTER — Ambulatory Visit: Payer: Medicare PPO | Admitting: Family Medicine

## 2016-02-05 VITALS — BP 161/78 | HR 66 | Temp 99.6°F | Ht 65.5 in | Wt 196.0 lb

## 2016-02-05 DIAGNOSIS — I10 Essential (primary) hypertension: Secondary | ICD-10-CM

## 2016-02-05 NOTE — Assessment & Plan Note (Signed)
BP very labile, seems to be a significant white coat component. Will continue current regimen. Continue to follow with Dr. Rockey Situ. Follow up 6 months.

## 2016-02-05 NOTE — Progress Notes (Signed)
BP 161/78 mmHg  Pulse 66  Temp(Src) 99.6 F (37.6 C)  Ht 5' 5.5" (1.664 m)  Wt 196 lb (88.905 kg)  BMI 32.11 kg/m2  SpO2 96%  LMP  (Approximate)   Subjective:    Patient ID: [name], female    DOB: 09-Sep-1941, 75 y.o.   MRN: [id number]  HPI: [name] is a 75 y.o. female  Chief Complaint  Patient presents with  . Hypertension   HYPERTENSION Hypertension status: Good at home, elevated in the office  Satisfied with current treatment? yes Duration of hypertension: chronic BP monitoring frequency:  a few times a week BP range: 100s-120s/60s-80s BP medication side effects:  no Medication compliance: excellent compliance Aspirin: no Recurrent headaches: no Visual changes: no Palpitations: no Dyspnea: no Chest pain: no Lower extremity edema: no Dizzy/lightheaded: no  Relevant past medical, surgical, family and social history reviewed and updated as indicated. Interim medical history since our last visit reviewed. Allergies and medications reviewed and updated.  Review of Systems  Constitutional: Negative.   Respiratory: Negative.   Cardiovascular: Negative.   Psychiatric/Behavioral: Negative.     Per HPI unless specifically indicated above     Objective:    BP 161/78 mmHg  Pulse 66  Temp(Src) 99.6 F (37.6 C)  Ht 5' 5.5" (1.664 m)  Wt 196 lb (88.905 kg)  BMI 32.11 kg/m2  SpO2 96%  LMP  (Approximate)  Wt Readings from Last 3 Encounters:  02/05/16 196 lb (88.905 kg)  11/07/15 198 lb (89.812 kg)  08/11/15 195 lb (88.451 kg)    Physical Exam  Constitutional: She is oriented to person, place, and time. She appears well-developed and well-nourished. No distress.  HENT:  Head: Normocephalic and atraumatic.  Right Ear: Hearing normal.  Left Ear: Hearing normal.  Nose: Nose normal.  Eyes: Conjunctivae and lids are normal. Right eye exhibits no discharge. Left eye exhibits no discharge. No scleral icterus.  Cardiovascular: Normal rate, regular rhythm and  intact distal pulses.  Exam reveals no gallop and no friction rub.   Murmur heard. Pulmonary/Chest: Effort normal and breath sounds normal. No respiratory distress. She has no wheezes. She has no rales. She exhibits no tenderness.  Musculoskeletal: Normal range of motion.  Neurological: She is alert and oriented to person, place, and time.  Skin: Skin is warm, dry and intact. No rash noted. She is not diaphoretic. No erythema. No pallor.  Psychiatric: She has a normal mood and affect. Her speech is normal and behavior is normal. Judgment and thought content normal. Cognition and memory are normal.  Nursing note and vitals reviewed.     Assessment & Plan:   Problem List Items Addressed This Visit      Cardiovascular and Mediastinum   HTN (hypertension) - Primary    BP very labile, seems to be a significant white coat component. Will continue current regimen. Continue to follow with Dr. Rockey Situ. Follow up 6 months.           Follow up plan: Return in about 6 months (around 08/06/2016) for Wellness.

## 2016-02-09 ENCOUNTER — Ambulatory Visit (INDEPENDENT_AMBULATORY_CARE_PROVIDER_SITE_OTHER): Payer: Medicare PPO | Admitting: Cardiovascular Disease

## 2016-02-09 ENCOUNTER — Encounter (INDEPENDENT_AMBULATORY_CARE_PROVIDER_SITE_OTHER): Payer: Self-pay

## 2016-02-09 ENCOUNTER — Encounter: Payer: Self-pay | Admitting: Cardiovascular Disease

## 2016-02-09 VITALS — BP 130/80 | HR 65 | Ht 67.0 in | Wt 196.0 lb

## 2016-02-09 DIAGNOSIS — I4891 Unspecified atrial fibrillation: Secondary | ICD-10-CM | POA: Diagnosis not present

## 2016-02-09 DIAGNOSIS — R21 Rash and other nonspecific skin eruption: Secondary | ICD-10-CM | POA: Diagnosis not present

## 2016-02-09 DIAGNOSIS — I482 Chronic atrial fibrillation, unspecified: Secondary | ICD-10-CM

## 2016-02-09 DIAGNOSIS — I10 Essential (primary) hypertension: Secondary | ICD-10-CM

## 2016-02-09 MED ORDER — RIVAROXABAN 20 MG PO TABS: 20.0000 mg | ORAL_TABLET | Freq: Every day | ORAL | Status: DC

## 2016-02-09 MED ORDER — LOSARTAN POTASSIUM 100 MG PO TABS: 100.0000 mg | ORAL_TABLET | Freq: Every day | ORAL | Status: DC

## 2016-02-09 MED ORDER — METOPROLOL SUCCINATE ER 50 MG PO TB24: 50.0000 mg | ORAL_TABLET | Freq: Every day | ORAL | Status: DC

## 2016-02-09 NOTE — Progress Notes (Signed)
Patient ID: [name], female    DOB: [birth year], 75 y.o.   MRN: [id number]  HPI Comments: Ms. Gohman is a 75 year old woman, patient of Dr. Wynetta Emery with history of labile blood pressure who presents for follow-up of her hypertension and chronic atrial fibrillation.   In follow-up, she reports that she is doing well Denies any tachycardia or palpitations or shortness of breath on exertion leg edema has resolved since she held her amlodipine No regular exercise program, Tolerating her current medication regiment   blood pressure at home well controlled Continued problems with varicose veins  EKG on today's visit shows atrial fibrillation, rate in the 60s, no significant ST or T-wave changes  Other past medical history reviewed Echocardiogram done recently showing severely dilated left atrium, elevated right ventricular systolic pressure, normal ejection fraction.  Remote history of smoking, not for 30 years Husband died several years ago No regular exercise program Reports her blood pressure is labile, sometimes low at home      Allergies  Allergen Reactions  . Amlodipine     Leg swelling  . Hctz [Hydrochlorothiazide] Nausea Only and Rash      Medication List       losartan 100 MG tablet  Commonly known as:  COZAAR  Take 1 tablet (100 mg total) by mouth daily.     metoprolol succinate 50 MG 24 hr tablet  Commonly known as:  TOPROL-XL  TAKE 1 TABLET(50 MG TOTAL) BY MOUTH DAILY. TAKE WITH OR IMMEDIATELY F OLLOWING A MEAL.     rivaroxaban 20 MG Tabs tablet  Commonly known as:  XARELTO  Take 1 tablet (20 mg total) by mouth daily with supper.        Past Medical History  Diagnosis Date  . Hypertension   . Cellulitis of lower leg 12/07/2014  . Cat bite of right lower leg [id number]    History reviewed. No pertinent past surgical history.  Social History  reports that she quit smoking about 37 years ago. She has never used smokeless tobacco. She reports that she  does not drink alcohol or use illicit drugs.  Family History family history includes Alcohol abuse in her brother and father; Heart disease in her father; Hyperlipidemia in her sister; Hypertension in her brother, father, mother, and sister.   Review of Systems  Constitutional: Negative.   Respiratory: Negative.   Cardiovascular: Negative.   Gastrointestinal: Negative.   Musculoskeletal: Negative.   Skin: Negative.   Neurological: Negative.   Hematological: Negative.   Psychiatric/Behavioral: Negative.   All other systems reviewed and are negative.   BP 130/80 mmHg  Pulse 65  Ht 5\' 7"  (1.702 m)  Wt 196 lb (88.905 kg)  BMI 30.69 kg/m2  SpO2 97%  LMP  (Approximate)  Physical Exam  Constitutional: She is oriented to person, place, and time. She appears well-developed and well-nourished.  HENT:  Head: Normocephalic.  Nose: Nose normal.  Mouth/Throat: Oropharynx is clear and moist.  Eyes: Conjunctivae are normal. Pupils are equal, round, and reactive to light.  Neck: Normal range of motion. Neck supple. No JVD present.  Cardiovascular: Normal rate, normal heart sounds and intact distal pulses.  An irregularly irregular rhythm present. Exam reveals no gallop and no friction rub.   No murmur heard. Large varicose veins noted, no pitting edema  Pulmonary/Chest: Effort normal and breath sounds normal. No respiratory distress. She has no wheezes. She has no rales. She exhibits no tenderness.  Abdominal: Soft. Bowel sounds are normal. She  exhibits no distension. There is no tenderness.  Musculoskeletal: Normal range of motion. She exhibits no edema or tenderness.  Lymphadenopathy:    She has no cervical adenopathy.  Neurological: She is alert and oriented to person, place, and time. Coordination normal.  Skin: Skin is warm and dry. No rash noted. No erythema.  Psychiatric: She has a normal mood and affect. Her behavior is normal. Judgment and thought content normal.

## 2016-02-09 NOTE — Patient Instructions (Signed)
You are doing well. No medication changes were made.  Please call us if you have new issues that need to be addressed before your next appt.  Your physician wants you to follow-up in: 6 months.  You will receive a reminder letter in the mail two months in advance. If you don't receive a letter, please call our office to schedule the follow-up appointment.   

## 2016-02-09 NOTE — Assessment & Plan Note (Signed)
Chronic atrial fibrillation, Rate well controlled, tolerating anticoagulation

## 2016-02-09 NOTE — Assessment & Plan Note (Signed)
Previous rash, seem to resolve by holding HCTZ, possible sulfa allergy

## 2016-02-09 NOTE — Assessment & Plan Note (Signed)
Blood pressure is well controlled on today's visit. No changes made to the medications. 

## 2016-02-10 ENCOUNTER — Telehealth: Payer: Self-pay | Admitting: Cardiology

## 2016-02-10 NOTE — Telephone Encounter (Signed)
Pt called after taking extra metoprolol - instructed she may feel lightheaded or dizzy but to take it easy and she should do fine.

## 2016-07-29 ENCOUNTER — Encounter (INDEPENDENT_AMBULATORY_CARE_PROVIDER_SITE_OTHER): Payer: Self-pay

## 2016-08-06 ENCOUNTER — Encounter: Payer: Self-pay | Admitting: Family Medicine

## 2016-08-06 ENCOUNTER — Ambulatory Visit (INDEPENDENT_AMBULATORY_CARE_PROVIDER_SITE_OTHER): Payer: Medicare PPO | Admitting: Family Medicine

## 2016-08-06 VITALS — BP 169/89 | HR 63 | Temp 98.8°F | Ht 65.4 in | Wt 183.9 lb

## 2016-08-06 DIAGNOSIS — I10 Essential (primary) hypertension: Secondary | ICD-10-CM

## 2016-08-06 DIAGNOSIS — R8281 Pyuria: Secondary | ICD-10-CM

## 2016-08-06 DIAGNOSIS — Z0000 Encounter for general adult medical examination without abnormal findings: Secondary | ICD-10-CM

## 2016-08-06 DIAGNOSIS — Z23 Encounter for immunization: Secondary | ICD-10-CM

## 2016-08-06 DIAGNOSIS — N390 Urinary tract infection, site not specified: Secondary | ICD-10-CM

## 2016-08-06 DIAGNOSIS — I739 Peripheral vascular disease, unspecified: Secondary | ICD-10-CM

## 2016-08-06 DIAGNOSIS — Z13220 Encounter for screening for lipoid disorders: Secondary | ICD-10-CM

## 2016-08-06 DIAGNOSIS — I482 Chronic atrial fibrillation, unspecified: Secondary | ICD-10-CM

## 2016-08-06 DIAGNOSIS — L97909 Non-pressure chronic ulcer of unspecified part of unspecified lower leg with unspecified severity: Secondary | ICD-10-CM | POA: Diagnosis not present

## 2016-08-06 NOTE — Progress Notes (Signed)
BP (!) 169/89 (BP Location: Left Arm, Cuff Size: Normal)   Pulse 63   Temp 98.8 F (37.1 C)   Ht 5' 5.4" (1.661 m)   Wt 183 lb 14.4 oz (83.4 kg)   LMP  (Approximate)   SpO2 99%   BMI 30.23 kg/m    Subjective:    Patient ID: [name], female    DOB: 1941/07/30, 75 y.o.   MRN: [id number]  HPI: [name] is a 75 y.o. female presenting on 08/06/2016 for comprehensive medical examination. Current medical complaints include:  SKIN INFECTION Duration: 2 months  Location: L lower leg History of trauma in area: no Pain: no Quality: stinging Severity: mild Redness: yes Swelling: yes Oozing: yes Pus: yes Fevers: no Nausea/vomiting: no Status: better Treatments attempted:warm compresses  Tetanus: UTD  She currently lives with: alone Menopausal Symptoms: no  Functional Status Survey: Is the patient deaf or have difficulty hearing?: No Does the patient have difficulty seeing, even when wearing glasses/contacts?: No Does the patient have difficulty concentrating, remembering, or making decisions?: No Does the patient have difficulty walking or climbing stairs?: No Does the patient have difficulty dressing or bathing?: No Does the patient have difficulty doing errands alone such as visiting a doctor's office or shopping?: No  Fall Risk  08/06/2016 04/11/2015  Falls in the past year? No No    Depression Screen Depression screen [facility] 2/9 08/06/2016 04/11/2015  Decreased Interest 0 0  Down, Depressed, Hopeless 0 0  PHQ - 2 Score 0 0    Advanced Directives Does patient have a HCPOA?    no Does patient have a living will or MOST form?  no  Past Medical History:  Past Medical History:  Diagnosis Date  . Cat bite of right lower leg [id number]  . Cellulitis of lower leg 12/07/2014  . Hypertension     Surgical History:  History reviewed. No pertinent surgical history.  Medications:  Current Outpatient Prescriptions on File Prior to Visit  Medication Sig  . losartan  (COZAAR) 100 MG tablet Take 1 tablet (100 mg total) by mouth daily.  . metoprolol succinate (TOPROL-XL) 50 MG 24 hr tablet Take 1 tablet (50 mg total) by mouth daily. Take with or immediately following a meal.  . rivaroxaban (XARELTO) 20 MG TABS tablet Take 1 tablet (20 mg total) by mouth daily with supper.   No current facility-administered medications on file prior to visit.     Allergies:  Allergies  Allergen Reactions  . Amlodipine     Leg swelling  . Hctz [Hydrochlorothiazide] Nausea Only and Rash    Social History:  Social History   Social History  . Marital status: Married    Spouse name: N/A  . Number of children: N/A  . Years of education: N/A   Occupational History  . Not on file.   Social History Main Topics  . Smoking status: Former Smoker    Quit date: 11/04/1978  . Smokeless tobacco: Never Used  . Alcohol use No  . Drug use: No  . Sexual activity: No   Other Topics Concern  . Not on file   Social History Narrative  . No narrative on file   History  Smoking Status  . Former Smoker  . Quit date: 11/04/1978  Smokeless Tobacco  . Never Used   History  Alcohol Use No    Family History:  Family History  Problem Relation Age of Onset  . Hypertension Mother   . Alcohol  abuse Father   . Heart disease Father   . Hypertension Father   . Hyperlipidemia Sister   . Hypertension Sister   . Alcohol abuse Brother   . Hypertension Brother     Past medical history, surgical history, medications, allergies, family history and social history reviewed with patient today and changes made to appropriate areas of the chart.   Review of Systems  Constitutional: Negative.   HENT: Negative.   Eyes: Negative.   Respiratory: Negative.   Cardiovascular: Negative.   Gastrointestinal: Negative.   Genitourinary: Negative.   Musculoskeletal: Negative.   Skin: Negative.        Nonhealing wound with oozing on L lower leg  Neurological: Negative.     Endo/Heme/Allergies: Negative.   Psychiatric/Behavioral: Negative.     All other ROS negative except what is listed above and in the HPI.      Objective:    BP (!) 169/89 (BP Location: Left Arm, Cuff Size: Normal)   Pulse 63   Temp 98.8 F (37.1 C)   Ht 5' 5.4" (1.661 m)   Wt 183 lb 14.4 oz (83.4 kg)   LMP  (Approximate)   SpO2 99%   BMI 30.23 kg/m   Wt Readings from Last 3 Encounters:  08/06/16 183 lb 14.4 oz (83.4 kg)  02/09/16 196 lb (88.9 kg)  02/05/16 196 lb (88.9 kg)    Physical Exam  Constitutional: She is oriented to person, place, and time. She appears well-developed and well-nourished. No distress.  HENT:  Head: Normocephalic and atraumatic.  Right Ear: Hearing, tympanic membrane, external ear and ear canal normal.  Left Ear: Hearing, tympanic membrane, external ear and ear canal normal.  Nose: Nose normal.  Mouth/Throat: Uvula is midline, oropharynx is clear and moist and mucous membranes are normal. No oropharyngeal exudate.  Eyes: Conjunctivae, EOM and lids are normal. Pupils are equal, round, and reactive to light. Right eye exhibits no discharge. Left eye exhibits no discharge. No scleral icterus.  Neck: Normal range of motion. Neck supple. No JVD present. No tracheal deviation present. No thyromegaly present.  Cardiovascular: Normal rate, regular rhythm, normal heart sounds and intact distal pulses.  Exam reveals no gallop and no friction rub.   No murmur heard. Pulmonary/Chest: Effort normal and breath sounds normal. No stridor. No respiratory distress. She has no wheezes. She has no rales. She exhibits no tenderness.  Abdominal: Soft. Bowel sounds are normal. She exhibits no distension and no mass. There is no tenderness. There is no rebound and no guarding.  Genitourinary:  Genitourinary Comments: Deferred with shared decision making.  Musculoskeletal: Normal range of motion. She exhibits edema (2+ bilaterally). She exhibits no tenderness or deformity.   Lymphadenopathy:    She has no cervical adenopathy.  Neurological: She is alert and oriented to person, place, and time. She has normal reflexes. She displays normal reflexes. No cranial nerve deficit. She exhibits normal muscle tone. Coordination normal.  Skin: Skin is warm, dry and intact. No rash noted. She is not diaphoretic. No erythema. No pallor.  Psychiatric: She has a normal mood and affect. Her speech is normal and behavior is normal. Judgment and thought content normal. Cognition and memory are normal.  Nursing note and vitals reviewed.   Cognitive Testing - 6-CIT  Correct? Score   What year is it? yes 0 Yes = 0    No = 4  What month is it? yes 0 Yes = 0    No = 3  Remember:  Pia Mau, [name], [place]     What time is it? yes 0 Yes = 0    No = 3  Count backwards from 20 to 1 yes 0 Correct = 0    1 error = 2   More than 1 error = 4  Say the months of the year in reverse. yes 4 Correct = 0    1 error = 2   More than 1 error = 4  What address did I ask you to remember? no 6 Correct = 0  1 error = 2    2 error = 4    3 error = 6    4 error = 8    All wrong = 10       TOTAL SCORE  10/28   Interpretation:  Abnormal- Will continue to monitor  Normal (0-7) Abnormal (8-28)      Assessment & Plan:   Problem List Items Addressed This Visit      Cardiovascular and Mediastinum   HTN (hypertension)    Still elevated. Continue to follow with cardiology. Good at home. Continue current regimen. Continue to monitor.       Relevant Orders   CBC with Differential/Platelet   Comprehensive metabolic panel   Microalbumin, Urine Waived (Completed)   TSH   UA/M w/rflx Culture, Routine (Completed)   Atrial fibrillation (HCC)    Stable. Continue to follow with Dr. Rockey Situ. Continue to to monitor.       Peripheral vascular disease of lower extremity with ulceration (HCC)    Ulceration on Lateral side of L calf with swelling. Will get into wound care. Appointment scheduled for  Friday.      Relevant Orders   AMB referral to wound care center    Other Visit Diagnoses    Wellness examination    -  Primary   Preventative care discussed today. Screening labs discussed today. Declines most preventative care. See below.   Relevant Orders   CBC with Differential/Platelet   Comprehensive metabolic panel   Lipid Panel w/o Chol/HDL Ratio   Microalbumin, Urine Waived (Completed)   TSH   UA/M w/rflx Culture, Routine (Completed)   Screening for cholesterol level       Labs checked today. Await results.    Relevant Orders   Lipid Panel w/o Chol/HDL Ratio   Immunization due       Flu and pneumovax given today   Relevant Orders   Flu vaccine HIGH DOSE PF (Fluzone High dose) (Completed)   Pneumococcal conjugate vaccine 13-valent (Completed)      Preventative Services:  Health Risk Assessment and Personalized Prevention Plan: done today Bone Mass Measurements: declined Breast Cancer Screening: declined CVD Screening: done today Cervical Cancer Screening: N/A Colon Cancer Screening: declined Depression Screening: done today Diabetes Screening: done today Glaucoma Screening: see your eye doctor Hepatitis B vaccine: N/A Hepatitis C screening: N/A HIV Screening: N/A Flu Vaccine: done today Lung cancer Screening: N/A Obesity Screening: done today Pneumonia Vaccines (2): 1st one give today STI Screening: N/A  Follow up plan: Return in about 6 months (around 02/04/2017) for For follow up BP if not sooner.   LABORATORY TESTING:  - Pap smear: not applicable  IMMUNIZATIONS:   - Tdap: Tetanus vaccination status reviewed: last tetanus booster within 10 years. - Influenza: Up to date - Pneumovax: Not applicable - Prevnar: Administered today - Zostavax vaccine: Refused  SCREENING: -Mammogram: Refused  - Colonoscopy: Refused  - Bone Density: Refused  -  Hearing Test: Up to date  -Spirometry: Not applicable   PATIENT COUNSELING:   Advised to take 1 mg of  folate supplement per day if capable of pregnancy.   Sexuality: Discussed sexually transmitted diseases, partner selection, use of condoms, avoidance of unintended pregnancy  and contraceptive alternatives.   Advised to avoid cigarette smoking.  I discussed with the patient that most people either abstain from alcohol or drink within safe limits (<=14/week and <=4 drinks/occasion for males, <=7/weeks and <= 3 drinks/occasion for females) and that the risk for alcohol disorders and other health effects rises proportionally with the number of drinks per week and how often a drinker exceeds daily limits.  Discussed cessation/primary prevention of drug use and availability of treatment for abuse.   Diet: Encouraged to adjust caloric intake to maintain  or achieve ideal body weight, to reduce intake of dietary saturated fat and total fat, to limit sodium intake by avoiding high sodium foods and not adding table salt, and to maintain adequate dietary potassium and calcium preferably from fresh fruits, vegetables, and low-fat dairy products.    stressed the importance of regular exercise  Injury prevention: Discussed safety belts, safety helmets, smoke detector, smoking near bedding or upholstery.   Dental health: Discussed importance of regular tooth brushing, flossing, and dental visits.    NEXT PREVENTATIVE PHYSICAL DUE IN 1 YEAR. Return in about 6 months (around 02/04/2017) for For follow up BP if not sooner.

## 2016-08-06 NOTE — Assessment & Plan Note (Signed)
Still elevated. Continue to follow with cardiology. Good at home. Continue current regimen. Continue to monitor.

## 2016-08-06 NOTE — Assessment & Plan Note (Signed)
Stable. Continue to follow with Dr. Rockey Situ. Continue to to monitor.

## 2016-08-06 NOTE — Assessment & Plan Note (Signed)
Ulceration on Lateral side of L calf with swelling. Will get into wound care. Appointment scheduled for Friday.

## 2016-08-06 NOTE — Patient Instructions (Addendum)
Preventative Services:  Health Risk Assessment and Personalized Prevention Plan: done today Bone Mass Measurements: declined Breast Cancer Screening: declined CVD Screening: done today Cervical Cancer Screening: N/A Colon Cancer Screening: declined Depression Screening: done today Diabetes Screening: done today Glaucoma Screening: see your eye doctor Hepatitis B vaccine: N/A Hepatitis C screening: N/A HIV Screening: N/A Flu Vaccine: done today Lung cancer Screening: N/A Obesity Screening: done today Pneumonia Vaccines (2): 1st one give today STI Screening: N/A  Influenza (Flu) Vaccine (Inactivated or Recombinant):  1. Why get vaccinated? Influenza ("flu") is a contagious disease that spreads around the [place] every year, usually between October and May. Flu is caused by influenza viruses, and is spread mainly by coughing, sneezing, and close contact. Anyone can get flu. Flu strikes suddenly and can last several days. Symptoms vary by age, but can include:  fever/chills  sore throat  muscle aches  fatigue  cough  headache  runny or stuffy nose Flu can also lead to pneumonia and blood infections, and cause diarrhea and seizures in children. If you have a medical condition, such as heart or lung disease, flu can make it worse. Flu is more dangerous for some people. Infants and young children, people 16 years of age and older, pregnant women, and people with certain health conditions or a weakened immune system are at greatest risk. Each year thousands of people in the [place] die from flu, and many more are hospitalized. Flu vaccine can:  keep you from getting flu,  make flu less severe if you do get it, and  keep you from spreading flu to your family and other people. 2. Inactivated and recombinant flu vaccines A dose of flu vaccine is recommended every flu season. Children 6 months through 66 years of age may need two doses during the same flu season.  Everyone else needs only one dose each flu season. Some inactivated flu vaccines contain a very small amount of a mercury-based preservative called thimerosal. Studies have not shown thimerosal in vaccines to be harmful, but flu vaccines that do not contain thimerosal are available. There is no live flu virus in flu shots. They cannot cause the flu. There are many flu viruses, and they are always changing. Each year a new flu vaccine is made to protect against three or four viruses that are likely to cause disease in the upcoming flu season. But even when the vaccine doesn't exactly match these viruses, it may still provide some protection. Flu vaccine cannot prevent:  flu that is caused by a virus not covered by the vaccine, or  illnesses that look like flu but are not. It takes about 2 weeks for protection to develop after vaccination, and protection lasts through the flu season. 3. Some people should not get this vaccine Tell the person who is giving you the vaccine:  If you have any severe, life-threatening allergies. If you ever had a life-threatening allergic reaction after a dose of flu vaccine, or have a severe allergy to any part of this vaccine, you may be advised not to get vaccinated. Most, but not all, types of flu vaccine contain a small amount of egg protein.  If you ever had Guillain-Barre Syndrome (also called GBS). Some people with a history of GBS should not get this vaccine. This should be discussed with your doctor.  If you are not feeling well. It is usually okay to get flu vaccine when you have a mild illness, but you might be asked to come  back when you feel better. 4. Risks of a vaccine reaction With any medicine, including vaccines, there is a chance of reactions. These are usually mild and go away on their own, but serious reactions are also possible. Most people who get a flu shot do not have any problems with it. [place] problems following a flu shot  include:  soreness, redness, or swelling where the shot was given  hoarseness  sore, red or itchy eyes  cough  fever  aches  headache  itching  fatigue If these problems occur, they usually begin soon after the shot and last 1 or 2 days. More serious problems following a flu shot can include the following:  There may be a small increased risk of Guillain-Barre Syndrome (GBS) after inactivated flu vaccine. This risk has been estimated at 1 or 2 additional cases per million people vaccinated. This is much lower than the risk of severe complications from flu, which can be prevented by flu vaccine.  Young children who get the flu shot along with pneumococcal vaccine (PCV13) and/or DTaP vaccine at the same time might be slightly more likely to have a seizure caused by fever. Ask your doctor for more information. Tell your doctor if a child who is getting flu vaccine has ever had a seizure. Problems that could happen after any injected vaccine:  People sometimes faint after a medical procedure, including vaccination. Sitting or lying down for about 15 minutes can help prevent fainting, and injuries caused by a fall. Tell your doctor if you feel dizzy, or have vision changes or ringing in the ears.  Some people get severe pain in the shoulder and have difficulty moving the arm where a shot was given. This happens very rarely.  Any medication can cause a severe allergic reaction. Such reactions from a vaccine are very rare, estimated at about 1 in a million doses, and would happen within a few minutes to a few hours after the vaccination. As with any medicine, there is a very remote chance of a vaccine causing a serious injury or death. The safety of vaccines is always being monitored. For more information, visit: http://www.aguilar.org/ 5. What if there is a serious reaction? What should I look for?  Look for anything that concerns you, such as signs of a severe allergic reaction,  very high fever, or unusual behavior. Signs of a severe allergic reaction can include hives, swelling of the face and throat, difficulty breathing, a fast heartbeat, dizziness, and weakness. These would start a few minutes to a few hours after the vaccination. What should I do?  If you think it is a severe allergic reaction or other emergency that can't wait, call 9-1-1 and get the person to the nearest hospital. Otherwise, call your doctor.  Reactions should be reported to the Vaccine Adverse Event Reporting System (VAERS). Your doctor should file this report, or you can do it yourself through the VAERS web site at www.vaers.SamedayNews.es, or by calling (970)002-4440. VAERS does not give medical advice. 6. The National Vaccine Injury Compensation Program The [employer] Vaccine Injury Compensation Program (VICP) is a federal program that was created to compensate people who may have been injured by certain vaccines. Persons who believe they may have been injured by a vaccine can learn about the program and about filing a claim by calling 224-344-1643 or visiting the [place] website at GoldCloset.com.ee. There is a time limit to file a claim for compensation. 7. How can I learn more?  Ask your healthcare  provider. He or she can give you the vaccine package insert or suggest other sources of information.  Call your local or state health department.  Contact the Centers for Disease Control and Prevention (CDC):  Call 508-377-4865 (1-800-CDC-INFO) or  Visit CDC's website at https://gibson.com/ Vaccine Information Statement Inactivated Influenza Vaccine (06/10/2014)   This information is not intended to replace advice given to you by your health care provider. Make sure you discuss any questions you have with your health care provider.   Document Released: 08/15/2006 Document Revised: 11/11/2014 Document Reviewed: 06/13/2014 Elsevier Interactive Patient Education 2016 [place] Maintenance, Female Adopting a healthy lifestyle and getting preventive care can go a long way to promote health and wellness. Talk with your health care provider about what schedule of regular examinations is right for you. This is a good chance for you to check in with your provider about disease prevention and staying healthy. In between checkups, there are plenty of things you can do on your own. Experts have done a lot of research about which lifestyle changes and preventive measures are most likely to keep you healthy. Ask your health care provider for more information. WEIGHT AND DIET  Eat a healthy diet  Be sure to include plenty of vegetables, fruits, low-fat dairy products, and lean protein.  Do not eat a lot of foods high in solid fats, added sugars, or salt.  Get regular exercise. This is one of the most important things you can do for your health.  Most adults should exercise for at least 150 minutes each week. The exercise should increase your heart rate and make you sweat (moderate-intensity exercise).  Most adults should also do strengthening exercises at least twice a week. This is in addition to the moderate-intensity exercise.  Maintain a healthy weight  Body mass index (BMI) is a measurement that can be used to identify possible weight problems. It estimates body fat based on height and weight. Your health care provider can help determine your BMI and help you achieve or maintain a healthy weight.  For females 74 years of age and older:   A BMI below 18.5 is considered underweight.  A BMI of 18.5 to 24.9 is normal.  A BMI of 25 to 29.9 is considered overweight.  A BMI of 30 and above is considered obese.  Watch levels of cholesterol and blood lipids  You should start having your blood tested for lipids and cholesterol at 75 years of age, then have this test every 5 years.  You may need to have your cholesterol levels checked more often if:  Your  lipid or cholesterol levels are high.  You are older than 75 years of age.  You are at high risk for heart disease.  CANCER SCREENING   Lung Cancer  Lung cancer screening is recommended for adults 37-48 years old who are at high risk for lung cancer because of a history of smoking.  A yearly low-dose CT scan of the lungs is recommended for people who:  Currently smoke.  Have quit within the past 15 years.  Have at least a 30-pack-year history of smoking. A pack year is smoking an average of one pack of cigarettes a day for 1 year.  Yearly screening should continue until it has been 15 years since you quit.  Yearly screening should stop if you develop a health problem that would prevent you from having lung cancer treatment.  Breast Cancer  Practice breast self-awareness. This means understanding  how your breasts normally appear and feel.  It also means doing regular breast self-exams. Let your health care provider know about any changes, no matter how small.  If you are in your 20s or 30s, you should have a clinical breast exam (CBE) by a health care provider every 1-3 years as part of a regular health exam.  If you are 31 or older, have a CBE every year. Also consider having a breast X-ray (mammogram) every year.  If you have a family history of breast cancer, talk to your health care provider about genetic screening.  If you are at high risk for breast cancer, talk to your health care provider about having an MRI and a mammogram every year.  Breast cancer gene (BRCA) assessment is recommended for women who have family members with BRCA-related cancers. BRCA-related cancers include:  Breast.  Ovarian.  Tubal.  Peritoneal cancers.  Results of the assessment will determine the need for genetic counseling and BRCA1 and BRCA2 testing. Cervical Cancer Your health care provider may recommend that you be screened regularly for cancer of the pelvic organs (ovaries, uterus,  and vagina). This screening involves a pelvic examination, including checking for microscopic changes to the surface of your cervix (Pap test). You may be encouraged to have this screening done every 3 years, beginning at age 38.  For women ages 20-65, health care providers may recommend pelvic exams and Pap testing every 3 years, or they may recommend the Pap and pelvic exam, combined with testing for human papilloma virus (HPV), every 5 years. Some types of HPV increase your risk of cervical cancer. Testing for HPV may also be done on women of any age with unclear Pap test results.  Other health care providers may not recommend any screening for nonpregnant women who are considered low risk for pelvic cancer and who do not have symptoms. Ask your health care provider if a screening pelvic exam is right for you.  If you have had past treatment for cervical cancer or a condition that could lead to cancer, you need Pap tests and screening for cancer for at least 20 years after your treatment. If Pap tests have been discontinued, your risk factors (such as having a new sexual partner) need to be reassessed to determine if screening should resume. Some women have medical problems that increase the chance of getting cervical cancer. In these cases, your health care provider may recommend more frequent screening and Pap tests. Colorectal Cancer  This type of cancer can be detected and often prevented.  Routine colorectal cancer screening usually begins at 75 years of age and continues through 75 years of age.  Your health care provider may recommend screening at an earlier age if you have risk factors for colon cancer.  Your health care provider may also recommend using home test kits to check for hidden blood in the stool.  A small camera at the end of a tube can be used to examine your colon directly (sigmoidoscopy or colonoscopy). This is done to check for the earliest forms of colorectal  cancer.  Routine screening usually begins at age 21.  Direct examination of the colon should be repeated every 5-10 years through 75 years of age. However, you may need to be screened more often if early forms of precancerous polyps or small growths are found. Skin Cancer  Check your skin from head to toe regularly.  Tell your health care provider about any new moles or changes in moles,  especially if there is a change in a mole's shape or color.  Also tell your health care provider if you have a mole that is larger than the size of a pencil eraser.  Always use sunscreen. Apply sunscreen liberally and repeatedly throughout the day.  Protect yourself by wearing long sleeves, pants, a wide-brimmed hat, and sunglasses whenever you are outside. HEART DISEASE, DIABETES, AND HIGH BLOOD PRESSURE   High blood pressure causes heart disease and increases the risk of stroke. High blood pressure is more likely to develop in:  People who have blood pressure in the high end of the normal range (130-139/85-89 mm Hg).  People who are overweight or obese.  People who are African American.  If you are 42-44 years of age, have your blood pressure checked every 3-5 years. If you are 53 years of age or older, have your blood pressure checked every year. You should have your blood pressure measured twice--once when you are at a hospital or clinic, and once when you are not at a hospital or clinic. Record the average of the two measurements. To check your blood pressure when you are not at a hospital or clinic, you can use:  An automated blood pressure machine at a pharmacy.  A home blood pressure monitor.  If you are between 53 years and 3 years old, ask your health care provider if you should take aspirin to prevent strokes.  Have regular diabetes screenings. This involves taking a blood sample to check your fasting blood sugar level.  If you are at a normal weight and have a low risk for diabetes,  have this test once every three years after 75 years of age.  If you are overweight and have a high risk for diabetes, consider being tested at a younger age or more often. PREVENTING INFECTION  Hepatitis B  If you have a higher risk for hepatitis B, you should be screened for this virus. You are considered at high risk for hepatitis B if:  You were born in a country where hepatitis B is common. Ask your health care provider which countries are considered high risk.  Your parents were born in a high-risk country, and you have not been immunized against hepatitis B (hepatitis B vaccine).  You have HIV or AIDS.  You use needles to inject street drugs.  You live with someone who has hepatitis B.  You have had sex with someone who has hepatitis B.  You get hemodialysis treatment.  You take certain medicines for conditions, including cancer, organ transplantation, and autoimmune conditions. Hepatitis C  Blood testing is recommended for:  Everyone born from [birth year] through 1965.  Anyone with known risk factors for hepatitis C. Sexually transmitted infections (STIs)  You should be screened for sexually transmitted infections (STIs) including gonorrhea and chlamydia if:  You are sexually active and are younger than 75 years of age.  You are older than 75 years of age and your health care provider tells you that you are at risk for this type of infection.  Your sexual activity has changed since you were last screened and you are at an increased risk for chlamydia or gonorrhea. Ask your health care provider if you are at risk.  If you do not have HIV, but are at risk, it may be recommended that you take a prescription medicine daily to prevent HIV infection. This is called pre-exposure prophylaxis (PrEP). You are considered at risk if:  You are sexually active and  do not regularly use condoms or know the HIV status of your partner(s).  You take drugs by injection.  You are sexually  active with a partner who has HIV. Talk with your health care provider about whether you are at high risk of being infected with HIV. If you choose to begin PrEP, you should first be tested for HIV. You should then be tested every 3 months for as long as you are taking PrEP.  PREGNANCY   If you are premenopausal and you may become pregnant, ask your health care provider about preconception counseling.  If you may become pregnant, take 400 to 800 micrograms (mcg) of folic acid every day.  If you want to prevent pregnancy, talk to your health care provider about birth control (contraception). OSTEOPOROSIS AND MENOPAUSE   Osteoporosis is a disease in which the bones lose minerals and strength with aging. This can result in serious bone fractures. Your risk for osteoporosis can be identified using a bone density scan.  If you are 8 years of age or older, or if you are at risk for osteoporosis and fractures, ask your health care provider if you should be screened.  Ask your health care provider whether you should take a calcium or vitamin D supplement to lower your risk for osteoporosis.  Menopause may have certain physical symptoms and risks.  Hormone replacement therapy may reduce some of these symptoms and risks. Talk to your health care provider about whether hormone replacement therapy is right for you.  HOME CARE INSTRUCTIONS   Schedule regular health, dental, and eye exams.  Stay current with your immunizations.   Do not use any tobacco products including cigarettes, chewing tobacco, or electronic cigarettes.  If you are pregnant, do not drink alcohol.  If you are breastfeeding, limit how much and how often you drink alcohol.  Limit alcohol intake to no more than 1 drink per day for nonpregnant women. One drink equals 12 ounces of beer, 5 ounces of wine, or 1 ounces of hard liquor.  Do not use street drugs.  Do not share needles.  Ask your health care provider for help if  you need support or information about quitting drugs.  Tell your health care provider if you often feel depressed.  Tell your health care provider if you have ever been abused or do not feel safe at home.   This information is not intended to replace advice given to you by your health care provider. Make sure you discuss any questions you have with your health care provider.   Document Released: 05/06/2011 Document Revised: 11/11/2014 Document Reviewed: 09/22/2013 Elsevier Interactive Patient Education [employer]. Menopause is a normal process in which your reproductive ability comes to an end. This process happens gradually over a span of months to years, usually between the ages of 74 and 44. Menopause is complete when you have missed 12 consecutive menstrual periods. It is important to talk with your health care provider about some of the most common conditions that affect postmenopausal women, such as heart disease, cancer, and bone loss (osteoporosis). Adopting a healthy lifestyle and getting preventive care can help to promote your health and wellness. Those actions can also lower your chances of developing some of these common conditions. WHAT SHOULD I KNOW ABOUT MENOPAUSE? During menopause, you may experience a number of symptoms, such as:  Moderate-to-severe hot flashes.  Night sweats.  Decrease in sex drive.  Mood swings.  Headaches.  Tiredness.  Irritability.  Memory  problems.  Insomnia. Choosing to treat or not to treat menopausal changes is an individual decision that you make with your health care provider. WHAT SHOULD I KNOW ABOUT HORMONE REPLACEMENT THERAPY AND SUPPLEMENTS? Hormone therapy products are effective for treating symptoms that are associated with menopause, such as hot flashes and night sweats. Hormone replacement carries certain risks, especially as you become older. If you are thinking about using estrogen or estrogen with progestin treatments,  discuss the benefits and risks with your health care provider. WHAT SHOULD I KNOW ABOUT HEART DISEASE AND STROKE? Heart disease, heart attack, and stroke become more likely as you age. This may be due, in part, to the hormonal changes that your body experiences during menopause. These can affect how your body processes dietary fats, triglycerides, and cholesterol. Heart attack and stroke are both medical emergencies. There are many things that you can do to help prevent heart disease and stroke:  Have your blood pressure checked at least every 1-2 years. High blood pressure causes heart disease and increases the risk of stroke.  If you are 67-44 years old, ask your health care provider if you should take aspirin to prevent a heart attack or a stroke.  Do not use any tobacco products, including cigarettes, chewing tobacco, or electronic cigarettes. If you need help quitting, ask your health care provider.  It is important to eat a healthy diet and maintain a healthy weight.  Be sure to include plenty of vegetables, fruits, low-fat dairy products, and lean protein.  Avoid eating foods that are high in solid fats, added sugars, or salt (sodium).  Get regular exercise. This is one of the most important things that you can do for your health.  Try to exercise for at least 150 minutes each week. The type of exercise that you do should increase your heart rate and make you sweat. This is known as moderate-intensity exercise.  Try to do strengthening exercises at least twice each week. Do these in addition to the moderate-intensity exercise.  Know your numbers.Ask your health care provider to check your cholesterol and your blood glucose. Continue to have your blood tested as directed by your health care provider. WHAT SHOULD I KNOW ABOUT CANCER SCREENING? There are several types of cancer. Take the following steps to reduce your risk and to catch any cancer development as early as  possible. Breast Cancer  Practice breast self-awareness.  This means understanding how your breasts normally appear and feel.  It also means doing regular breast self-exams. Let your health care provider know about any changes, no matter how small.  If you are 27 or older, have a clinician do a breast exam (clinical breast exam or CBE) every year. Depending on your age, family history, and medical history, it may be recommended that you also have a yearly breast X-ray (mammogram).  If you have a family history of breast cancer, talk with your health care provider about genetic screening.  If you are at high risk for breast cancer, talk with your health care provider about having an MRI and a mammogram every year.  Breast cancer (BRCA) gene test is recommended for women who have family members with BRCA-related cancers. Results of the assessment will determine the need for genetic counseling and BRCA1 and for BRCA2 testing. BRCA-related cancers include these types:  Breast. This occurs in males or females.  Ovarian.  Tubal. This may also be called fallopian tube cancer.  Cancer of the abdominal or pelvic lining (peritoneal  cancer).  Prostate.  Pancreatic. Cervical, Uterine, and Ovarian Cancer Your health care provider may recommend that you be screened regularly for cancer of the pelvic organs. These include your ovaries, uterus, and vagina. This screening involves a pelvic exam, which includes checking for microscopic changes to the surface of your cervix (Pap test).  For women ages 21-65, health care providers may recommend a pelvic exam and a Pap test every three years. For women ages 41-65, they may recommend the Pap test and pelvic exam, combined with testing for human papilloma virus (HPV), every five years. Some types of HPV increase your risk of cervical cancer. Testing for HPV may also be done on women of any age who have unclear Pap test results.  Other health care providers  may not recommend any screening for nonpregnant women who are considered low risk for pelvic cancer and have no symptoms. Ask your health care provider if a screening pelvic exam is right for you.  If you have had past treatment for cervical cancer or a condition that could lead to cancer, you need Pap tests and screening for cancer for at least 20 years after your treatment. If Pap tests have been discontinued for you, your risk factors (such as having a new sexual partner) need to be reassessed to determine if you should start having screenings again. Some women have medical problems that increase the chance of getting cervical cancer. In these cases, your health care provider may recommend that you have screening and Pap tests more often.  If you have a family history of uterine cancer or ovarian cancer, talk with your health care provider about genetic screening.  If you have vaginal bleeding after reaching menopause, tell your health care provider.  There are currently no reliable tests available to screen for ovarian cancer. Lung Cancer Lung cancer screening is recommended for adults 72-68 years old who are at high risk for lung cancer because of a history of smoking. A yearly low-dose CT scan of the lungs is recommended if you:  Currently smoke.  Have a history of at least 30 pack-years of smoking and you currently smoke or have quit within the past 15 years. A pack-year is smoking an average of one pack of cigarettes per day for one year. Yearly screening should:  Continue until it has been 15 years since you quit.  Stop if you develop a health problem that would prevent you from having lung cancer treatment. Colorectal Cancer  This type of cancer can be detected and can often be prevented.  Routine colorectal cancer screening usually begins at age 13 and continues through age 44.  If you have risk factors for colon cancer, your health care provider may recommend that you be  screened at an earlier age.  If you have a family history of colorectal cancer, talk with your health care provider about genetic screening.  Your health care provider may also recommend using home test kits to check for hidden blood in your stool.  A small camera at the end of a tube can be used to examine your colon directly (sigmoidoscopy or colonoscopy). This is done to check for the earliest forms of colorectal cancer.  Direct examination of the colon should be repeated every 5-10 years until age 28. However, if early forms of precancerous polyps or small growths are found or if you have a family history or genetic risk for colorectal cancer, you may need to be screened more often. Skin Cancer  Check  your skin from head to toe regularly.  Monitor any moles. Be sure to tell your health care provider:  About any new moles or changes in moles, especially if there is a change in a mole's shape or color.  If you have a mole that is larger than the size of a pencil eraser.  If any of your family members has a history of skin cancer, especially at a young age, talk with your health care provider about genetic screening.  Always use sunscreen. Apply sunscreen liberally and repeatedly throughout the day.  Whenever you are outside, protect yourself by wearing long sleeves, pants, a wide-brimmed hat, and sunglasses. WHAT SHOULD I KNOW ABOUT OSTEOPOROSIS? Osteoporosis is a condition in which bone destruction happens more quickly than new bone creation. After menopause, you may be at an increased risk for osteoporosis. To help prevent osteoporosis or the bone fractures that can happen because of osteoporosis, the following is recommended:  If you are 20-32 years old, get at least 1,000 mg of calcium and at least 600 mg of vitamin D per day.  If you are older than age 40 but younger than age 77, get at least 1,200 mg of calcium and at least 600 mg of vitamin D per day.  If you are older than  age 3, get at least 1,200 mg of calcium and at least 800 mg of vitamin D per day. Smoking and excessive alcohol intake increase the risk of osteoporosis. Eat foods that are rich in calcium and vitamin D, and do weight-bearing exercises several times each week as directed by your health care provider. WHAT SHOULD I KNOW ABOUT HOW MENOPAUSE AFFECTS [place]? Depression may occur at any age, but it is more common as you become older. Common symptoms of depression include:  Low or sad mood.  Changes in sleep patterns.  Changes in appetite or eating patterns.  Feeling an overall lack of motivation or enjoyment of activities that you previously enjoyed.  Frequent crying spells. Talk with your health care provider if you think that you are experiencing depression. WHAT SHOULD I KNOW ABOUT IMMUNIZATIONS? It is important that you get and maintain your immunizations. These include:  Tetanus, diphtheria, and pertussis (Tdap) booster vaccine.  Influenza every year before the flu season begins.  Pneumonia vaccine.  Shingles vaccine. Your health care provider may also recommend other immunizations.   This information is not intended to replace advice given to you by your health care provider. Make sure you discuss any questions you have with your health care provider.   Document Released: 12/13/2005 Document Revised: 11/11/2014 Document Reviewed: 06/23/2014 Elsevier Interactive Patient Education 2016 [place]. Pneumococcal Vaccine, Polyvalent suspension for injection What is this medicine? PNEUMOCOCCAL VACCINE (NEU mo KOK al vak SEEN) is a vaccine used to prevent pneumococcus bacterial infections. These bacteria can cause serious infections like pneumonia, meningitis, and blood infections. This vaccine will lower your chance of getting pneumonia. If you do get pneumonia, it can make your symptoms milder and your illness shorter. This vaccine will not treat an infection and will not  cause infection. This vaccine is recommended for infants and young children, adults with certain medical conditions, and adults 50 years or older. This medicine may be used for other purposes; ask your health care provider or pharmacist if you have questions. What should I tell my health care provider before I take this medicine? They need to know if you have any of these conditions: -bleeding problems -fever -immune  system problems -an unusual or allergic reaction to pneumococcal vaccine, diphtheria toxoid, other vaccines, latex, other medicines, foods, dyes, or preservatives -pregnant or trying to get pregnant -breast-feeding How should I use this medicine? This vaccine is for injection into a muscle. It is given by a health care professional. A copy of Vaccine Information Statements will be given before each vaccination. Read this sheet carefully each time. The sheet may change frequently. Talk to your pediatrician regarding the use of this medicine in children. While this drug may be prescribed for children as young as 81 weeks old for selected conditions, precautions do apply. Overdosage: If you think you have taken too much of this medicine contact a poison control center or emergency room at once. NOTE: This medicine is only for you. Do not share this medicine with others. What if I miss a dose? It is important not to miss your dose. Call your doctor or health care professional if you are unable to keep an appointment. What may interact with this medicine? -medicines for cancer chemotherapy -medicines that suppress your immune function -steroid medicines like prednisone or cortisone This list may not describe all possible interactions. Give your health care provider a list of all the medicines, herbs, non-prescription drugs, or dietary supplements you use. Also tell them if you smoke, drink alcohol, or use illegal drugs. Some items may interact with your medicine. What should I watch for  while using this medicine? Mild fever and pain should go away in 3 days or less. Report any unusual symptoms to your doctor or health care professional. What side effects may I notice from receiving this medicine? Side effects that you should report to your doctor or health care professional as soon as possible: -allergic reactions like skin rash, itching or hives, swelling of the face, lips, or tongue -breathing problems -confused -fast or irregular heartbeat -fever over 102 degrees F -seizures -unusual bleeding or bruising -unusual muscle weakness Side effects that usually do not require medical attention (report to your doctor or health care professional if they continue or are bothersome): -aches and pains -diarrhea -fever of 102 degrees F or less -headache -irritable -loss of appetite -pain, tender at site where injected -trouble sleeping This list may not describe all possible side effects. Call your doctor for medical advice about side effects. You may report side effects to FDA at 1-800-FDA-1088. Where should I keep my medicine? This does not apply. This vaccine is given in a clinic, pharmacy, doctor's office, or other health care setting and will not be stored at home. NOTE: This sheet is a summary. It may not cover all possible information. If you have questions about this medicine, talk to your doctor, pharmacist, or health care provider.    2016, Elsevier/Gold Standard. (2014-07-28 10:27:27)

## 2016-08-07 ENCOUNTER — Encounter: Payer: Self-pay | Admitting: Family Medicine

## 2016-08-07 LAB — LIPID PANEL W/O CHOL/HDL RATIO
Cholesterol, Total: 162 mg/dL (ref 100–199)
HDL: 70 mg/dL (ref >39)
LDL CALC: 75 mg/dL (ref 0–99)
TRIGLYCERIDES: 83 mg/dL (ref 0–149)
VLDL Cholesterol Cal: 17 mg/dL (ref 5–40)

## 2016-08-07 LAB — CBC WITH DIFFERENTIAL/PLATELET
BASOS: 0 %
Basophils Absolute: 0 10*3/uL (ref 0.0–0.2)
EOS (ABSOLUTE): 0.2 10*3/uL (ref 0.0–0.4)
EOS: 2 %
HEMATOCRIT: 39 % (ref 34.0–46.6)
HEMOGLOBIN: 12.8 g/dL (ref 11.1–15.9)
IMMATURE GRANS (ABS): 0 10*3/uL (ref 0.0–0.1)
IMMATURE GRANULOCYTES: 0 %
LYMPHS: 28 %
Lymphocytes Absolute: 1.8 10*3/uL (ref 0.7–3.1)
MCH: 29.8 pg (ref 26.6–33.0)
MCHC: 32.8 g/dL (ref 31.5–35.7)
MCV: 91 fL (ref 79–97)
MONOS ABS: 0.3 10*3/uL (ref 0.1–0.9)
Monocytes: 5 %
NEUTROS PCT: 65 %
Neutrophils Absolute: 4.1 10*3/uL (ref 1.4–7.0)
Platelets: 284 10*3/uL (ref 150–379)
RBC: 4.3 x10E6/uL (ref 3.77–5.28)
RDW: 13.9 % (ref 12.3–15.4)
WBC: 6.4 10*3/uL (ref 3.4–10.8)

## 2016-08-07 LAB — COMPREHENSIVE METABOLIC PANEL
A/G RATIO: 1.7 (ref 1.2–2.2)
ALBUMIN: 4.3 g/dL (ref 3.5–4.8)
ALT: 36 IU/L — ABNORMAL HIGH (ref 0–32)
AST: 31 IU/L (ref 0–40)
Alkaline Phosphatase: 94 IU/L (ref 39–117)
BUN/Creatinine Ratio: 30 — ABNORMAL HIGH (ref 12–28)
BUN: 25 mg/dL (ref 8–27)
Bilirubin Total: 0.4 mg/dL (ref 0.0–1.2)
CALCIUM: 9.2 mg/dL (ref 8.7–10.3)
CO2: 27 mmol/L (ref 18–29)
CREATININE: 0.84 mg/dL (ref 0.57–1.00)
Chloride: 103 mmol/L (ref 96–106)
GFR, EST AFRICAN AMERICAN: 79 mL/min/{1.73_m2} (ref >59)
GFR, EST NON AFRICAN AMERICAN: 68 mL/min/{1.73_m2} (ref >59)
GLOBULIN, TOTAL: 2.5 g/dL (ref 1.5–4.5)
Glucose: 100 mg/dL — ABNORMAL HIGH (ref 65–99)
POTASSIUM: 4.6 mmol/L (ref 3.5–5.2)
SODIUM: 144 mmol/L (ref 134–144)
TOTAL PROTEIN: 6.8 g/dL (ref 6.0–8.5)

## 2016-08-07 LAB — TSH: TSH: 0.912 u[IU]/mL (ref 0.450–4.500)

## 2016-08-08 LAB — UA/M W/RFLX CULTURE, ROUTINE
BILIRUBIN UA: NEGATIVE
GLUCOSE, UA: NEGATIVE
KETONES UA: NEGATIVE
Nitrite, UA: NEGATIVE
Protein, UA: NEGATIVE
SPEC GRAV UA: 1.01 (ref 1.005–1.030)
Urobilinogen, Ur: 0.2 mg/dL (ref 0.2–1.0)
pH, UA: 5 (ref 5.0–7.5)

## 2016-08-08 LAB — MICROSCOPIC EXAMINATION

## 2016-08-08 LAB — URINE CULTURE, REFLEX

## 2016-08-08 LAB — MICROALBUMIN, URINE WAIVED
CREATININE, URINE WAIVED: 50 mg/dL (ref 10–300)
MICROALB, UR WAIVED: 10 mg/L (ref 0–19)
Microalb/Creat Ratio: <30 mg/g (ref <30)

## 2016-08-09 ENCOUNTER — Ambulatory Visit
Admission: RE | Admit: 2016-08-09 | Discharge: 2016-08-09 | Disposition: A | Discharge status: Home / Self Care | Payer: Medicare PPO | Source: Ambulatory Visit | Attending: Nurse Practitioner | Admitting: Nurse Practitioner

## 2016-08-09 ENCOUNTER — Encounter: Payer: Medicare PPO | Attending: Nurse Practitioner | Admitting: Nurse Practitioner

## 2016-08-09 ENCOUNTER — Other Ambulatory Visit
Admission: RE | Admit: 2016-08-09 | Discharge: 2016-08-09 | Disposition: A | Discharge status: Home / Self Care | Payer: Medicare PPO | Source: Ambulatory Visit | Attending: *Deleted | Admitting: *Deleted

## 2016-08-09 ENCOUNTER — Other Ambulatory Visit: Payer: Self-pay | Admitting: Nurse Practitioner

## 2016-08-09 DIAGNOSIS — S81802A Unspecified open wound, left lower leg, initial encounter: Secondary | ICD-10-CM

## 2016-08-09 DIAGNOSIS — I87312 Chronic venous hypertension (idiopathic) with ulcer of left lower extremity: Secondary | ICD-10-CM | POA: Insufficient documentation

## 2016-08-09 DIAGNOSIS — I10 Essential (primary) hypertension: Secondary | ICD-10-CM | POA: Insufficient documentation

## 2016-08-09 DIAGNOSIS — I739 Peripheral vascular disease, unspecified: Secondary | ICD-10-CM | POA: Diagnosis not present

## 2016-08-09 DIAGNOSIS — L97829 Non-pressure chronic ulcer of other part of left lower leg with unspecified severity: Secondary | ICD-10-CM | POA: Diagnosis not present

## 2016-08-09 DIAGNOSIS — T148XXD Other injury of unspecified body region, subsequent encounter: Secondary | ICD-10-CM | POA: Diagnosis present

## 2016-08-09 DIAGNOSIS — I4891 Unspecified atrial fibrillation: Secondary | ICD-10-CM | POA: Diagnosis not present

## 2016-08-09 DIAGNOSIS — M19072 Primary osteoarthritis, left ankle and foot: Secondary | ICD-10-CM | POA: Diagnosis not present

## 2016-08-09 DIAGNOSIS — M1712 Unilateral primary osteoarthritis, left knee: Secondary | ICD-10-CM | POA: Diagnosis not present

## 2016-08-09 DIAGNOSIS — Z87891 Personal history of nicotine dependence: Secondary | ICD-10-CM | POA: Insufficient documentation

## 2016-08-09 IMAGING — CR DG TIBIA/FIBULA 2V*L*
1 series · 4 of 4 positions shown · non-contrast
Comparison: Left knee [DATE]

CLINICAL DATA: Nonhealing wound from cat scratch for 2 months.

EXAM:
LEFT TIBIA AND FIBULA - 2 VIEW

[Series 1: dg tibia/fibula left · 0.14mm/px · 4 of 4 slices shown]
[im 1/4]
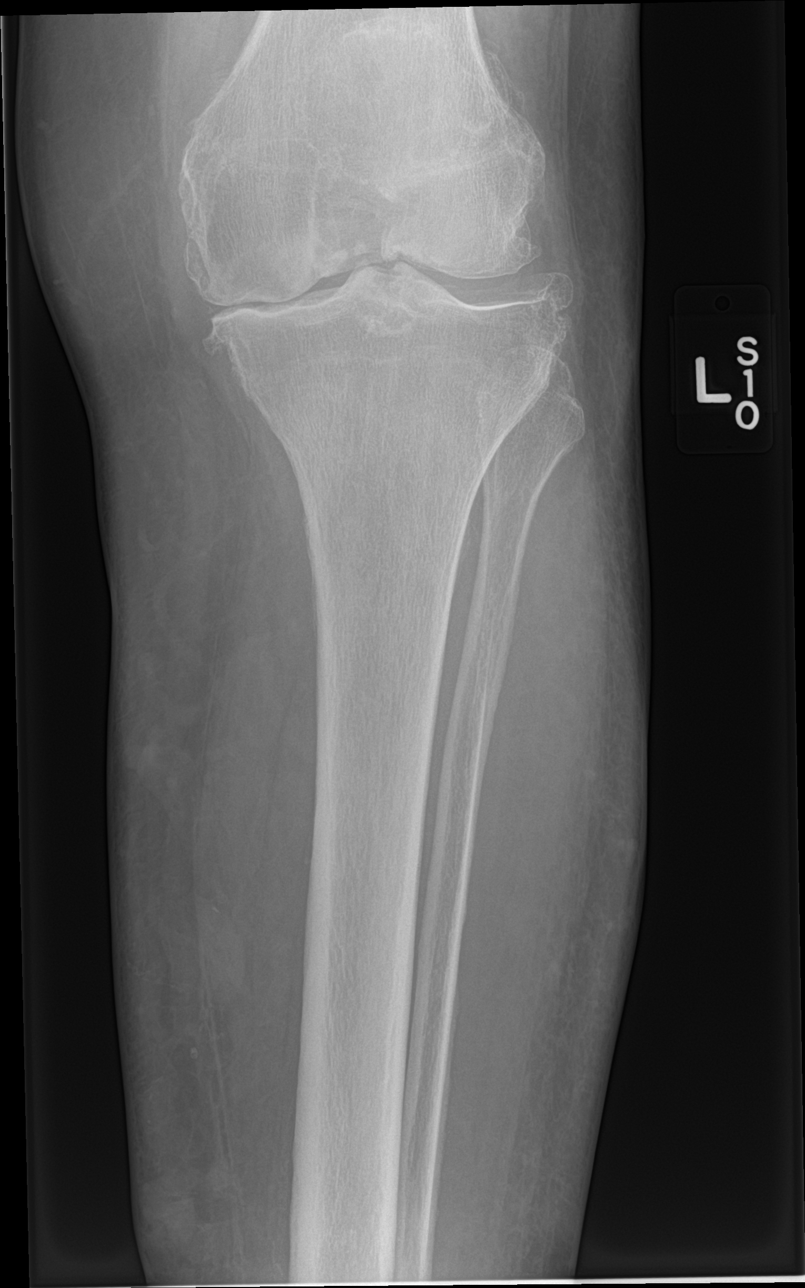
[im 2/4]
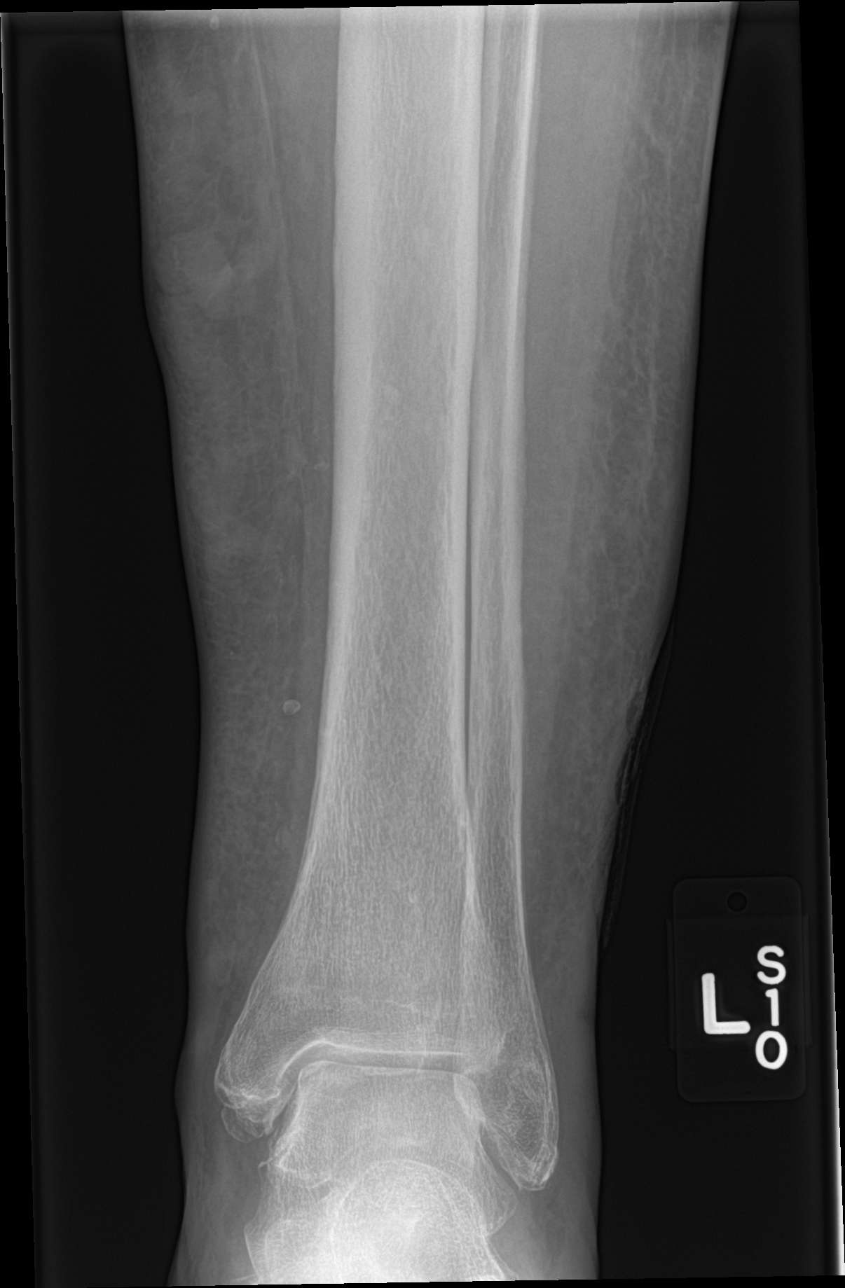
[im 3/4]
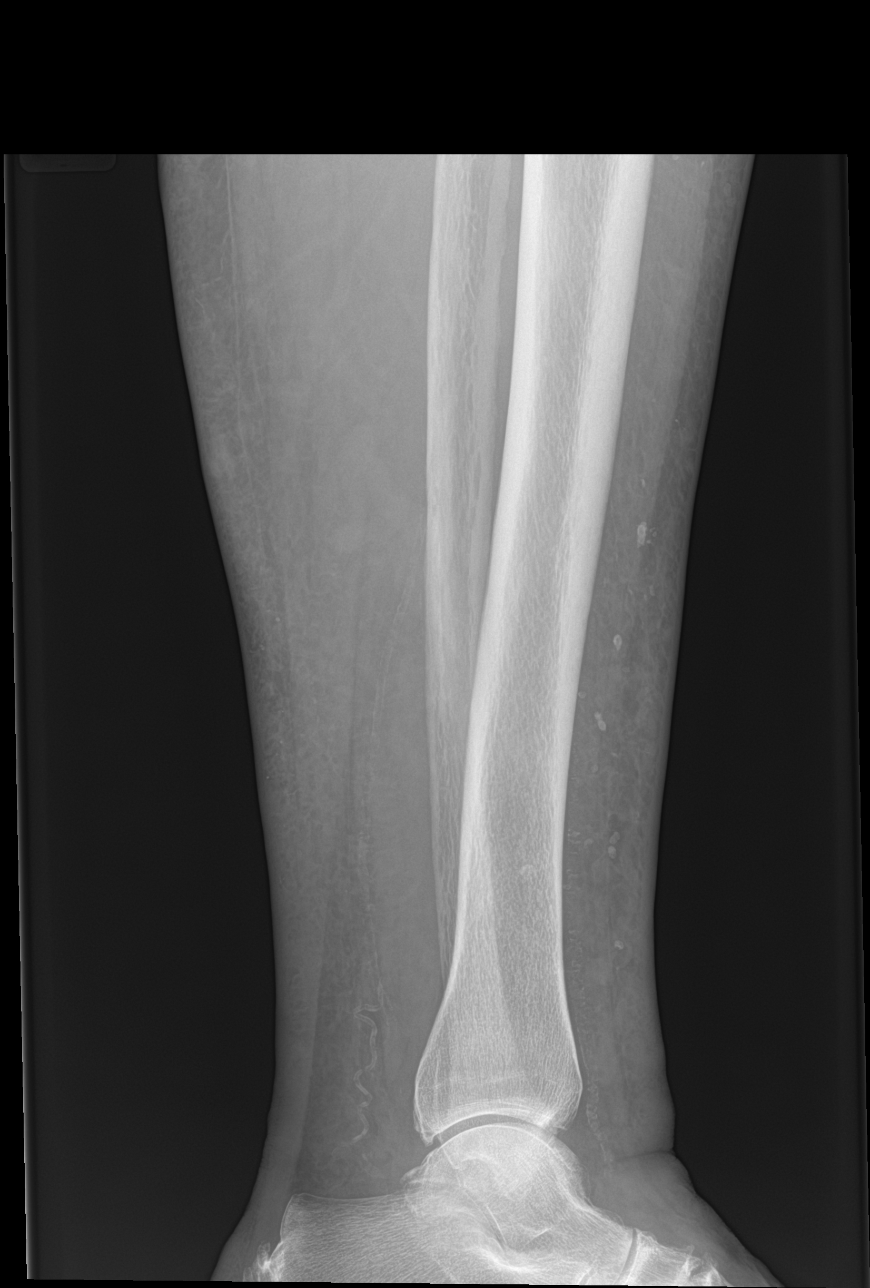
[im 4/4]
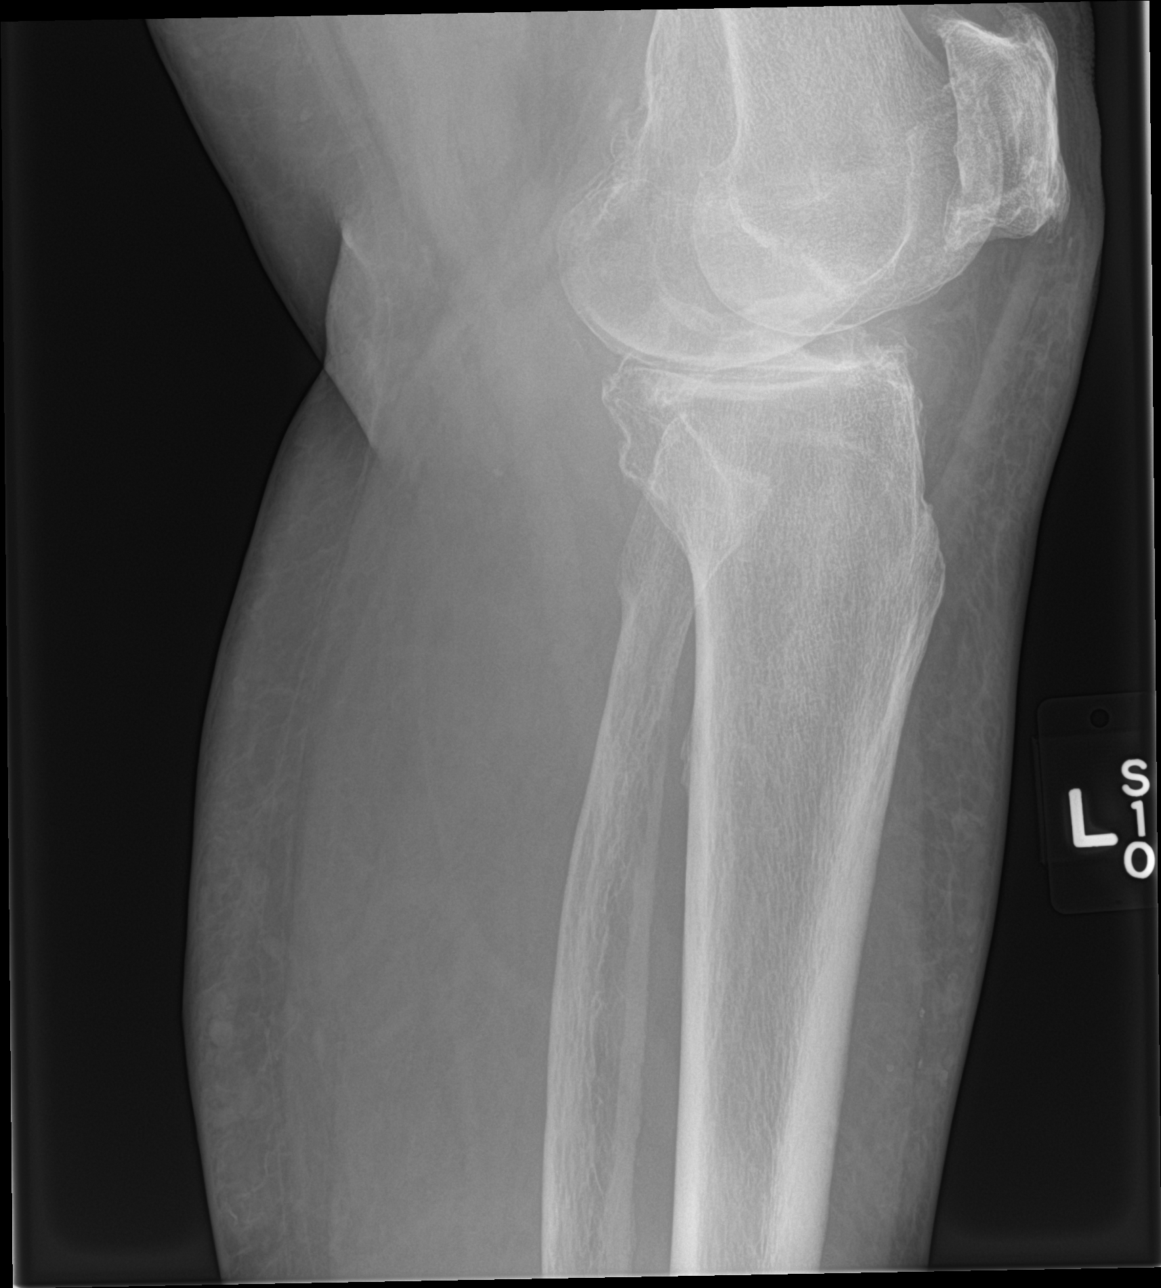

[4 of 4 positions shown; findings below may reference images not displayed]

FINDINGS: Frontal and lateral views obtained. There is soft tissue induration
lateral to the distal fibula, likely soft tissue injury. No frank
abscess seen by radiography in this area. There is no fracture or
dislocation. No erosive change or bony destruction. There is
generalized osteoarthritic change in the knee and ankle regions.
There are calcified phleboliths in the anterior lower extremity
region.
IMPRESSION: No erosive change or bony destruction. No abnormal periosteal
reaction. No fracture or dislocation. There is osteoarthritic change
in the knee and ankle joints. There is soft tissue induration
lateral to the distal fibula, likely the site of soft tissue injury.
No well-defined soft tissue abscess evident by radiography.

## 2016-08-09 NOTE — Progress Notes (Addendum)
DREAMER, DULONG ([id number]) Visit Report for 08/09/2016 Abuse/Suicide Risk Screen Details Patient Name: [name], [name]. Date of Service: 08/09/2016 8:00 AM Medical Record Number: [id number] Patient Account Number: [id number] Date of Birth/Sex: Jan 02, 1941 (75 y.o. Female) Treating RN: Montey Hora Primary Care Physician: Park Liter Other Clinician: Referring Physician: Park Liter Treating Physician/Extender: Loistine Chance in Treatment: 0 Abuse/Suicide Risk Screen Items Answer ABUSE/SUICIDE RISK SCREEN: Has anyone close to you tried to hurt or harm you recentlyo No Do you feel uncomfortable with anyone in your familyo No Has anyone forced you do things that you didnot want to doo No Do you have any thoughts of harming yourselfo No Patient displays signs or symptoms of abuse and/or neglect. No Electronic Signature(s) Signed: 08/09/2016 7:58:27 AM By: Montey Hora Entered By: Montey Hora on 08/09/2016 07:58:27 Friedt, [name] ([id number]) -------------------------------------------------------------------------------- Activities of Daily Living Details Patient Name: [name] Date of Service: 08/09/2016 8:00 AM Medical Record Number: [id number] Patient Account Number: [id number] Date of Birth/Sex: [birth year] (75 y.o. Female) Treating RN: Montey Hora Primary Care Physician: Park Liter Other Clinician: Referring Physician: Park Liter Treating Physician/Extender: Loistine Chance in Treatment: 0 Activities of Daily Living Items Answer Activities of Daily Living (Please select one for each item) Drive Automobile Completely Able Take Medications Completely Able Use Telephone Completely Able Care for Appearance Completely Able Use Toilet Completely Able Bath / Shower Completely Able Dress Self Completely Able Feed Self Completely Able Walk Completely Able Get In / Out Bed Completely Able Housework Completely Able Prepare Meals Completely  Able Handle Money Completely Able Shop for Self Completely Able Electronic Signature(s) Signed: 08/09/2016 7:59:17 AM By: Montey Hora Entered By: Montey Hora on 08/09/2016 07:59:17 Agent, [name] ([id number]) -------------------------------------------------------------------------------- Education Assessment Details Patient Name: [name] Date of Service: 08/09/2016 8:00 AM Medical Record Number: [id number] Patient Account Number: [id number] Date of Birth/Sex: Mar 24, 1941 (75 y.o. Female) Treating RN: Montey Hora Primary Care Physician: Park Liter Other Clinician: Referring Physician: Park Liter Treating Physician/Extender: Loistine Chance in Treatment: 0 Primary Learner Assessed: Patient Learning Preferences/Education Level/Primary Language Learning Preference: Explanation, Demonstration Highest Education Level: High School Preferred Language: English Cognitive Barrier Assessment/Beliefs Language Barrier: No Translator Needed: No Memory Deficit: No Emotional Barrier: No Cultural/Religious Beliefs Affecting Medical No Care: Physical Barrier Assessment Impaired Vision: No Impaired Hearing: No Decreased Hand dexterity: No Knowledge/Comprehension Assessment Knowledge Level: Medium Comprehension Level: Medium Ability to understand written Medium instructions: Ability to understand verbal Medium instructions: Motivation Assessment Anxiety Level: Calm Cooperation: Cooperative Education Importance: Acknowledges Need Interest in Health Problems: Asks Questions Perception: Coherent Willingness to Engage in Self- Medium Management Activities: Readiness to Engage in Self- Medium Management Activities: Electronic Signature(s) Furlough, LAIGHLA MCMURDIE ([id number]) Signed: 08/09/2016 7:59:46 AM By: Montey Hora Entered By: Montey Hora on 08/09/2016 07:59:46 Krenn, [name]  ([id number]) -------------------------------------------------------------------------------- Fall Risk Assessment Details Patient Name: [name] Date of Service: 08/09/2016 8:00 AM Medical Record Number: [id number] Patient Account Number: [id number] Date of Birth/Sex: Oct 03, 1941 (75 y.o. Female) Treating RN: Montey Hora Primary Care Physician: Park Liter Other Clinician: Referring Physician: Park Liter Treating Physician/Extender: Loistine Chance in Treatment: 0 Fall Risk Assessment Items Have you had 2 or more falls in the last 12 monthso 0 No Have you had any fall that resulted in injury in the last 12 monthso 0 No FALL RISK ASSESSMENT: History of falling - immediate or within 3 months 0 No Secondary diagnosis 0 No Ambulatory aid None/bed rest/wheelchair/nurse 0 Yes Crutches/cane/walker 0 No Furniture 0  No IV Access/Saline Lock 0 No Gait/Training Normal/bed rest/immobile 0 Yes Weak 0 No Impaired 0 No Mental Status Oriented to own ability 0 Yes Electronic Signature(s) Signed: 08/09/2016 8:02:18 AM By: Montey Hora Entered By: Montey Hora on 08/09/2016 08:02:17 Robbs, [name] ([id number]) -------------------------------------------------------------------------------- Foot Assessment Details Patient Name: [name] Date of Service: 08/09/2016 8:00 AM Medical Record Number: [id number] Patient Account Number: [id number] Date of Birth/Sex: 02-14-1941 (75 y.o. Female) Treating RN: Montey Hora Primary Care Physician: Park Liter Other Clinician: Referring Physician: Park Liter Treating Physician/Extender: Loistine Chance in Treatment: 0 Foot Assessment Items Site Locations + = Sensation present, - = Sensation absent, C = Callus, U = Ulcer R = Redness, W = Warmth, M = Maceration, PU = Pre-ulcerative lesion F = Fissure, S = Swelling, D = Dryness Assessment Right: Left: Other Deformity: No No Prior Foot Ulcer: No No Prior  Amputation: No No Charcot Joint: No No Ambulatory Status: Ambulatory Without Help Gait: Steady Electronic Signature(s) Signed: 08/09/2016 5:08:51 PM By: Montey Hora Entered By: Montey Hora on 08/09/2016 08:24:57 Gabrielson, [name] ([id number]) -------------------------------------------------------------------------------- Nutrition Risk Assessment Details Patient Name: [name] Date of Service: 08/09/2016 8:00 AM Medical Record Number: [id number] Patient Account Number: [id number] Date of Birth/Sex: 1941-08-22 (75 y.o. Female) Treating RN: Montey Hora Primary Care Physician: Park Liter Other Clinician: Referring Physician: Park Liter Treating Physician/Extender: Loistine Chance in Treatment: 0 Height (in): Weight (lbs): Body Mass Index (BMI): Nutrition Risk Assessment Items NUTRITION RISK SCREEN: I have an illness or condition that made me change the kind and/or 0 No amount of food I eat I eat fewer than two meals per day 0 No I eat few fruits and vegetables, or milk products 0 No I have three or more drinks of beer, liquor or wine almost every day 0 No I have tooth or mouth problems that make it hard for me to eat 0 No I don't always have enough money to buy the food I need 0 No I eat alone most of the time 0 No I take three or more different prescribed or over-the-counter drugs a 1 Yes day Without wanting to, I have lost or gained 10 pounds in the last six 0 No months I am not always physically able to shop, cook and/or feed myself 0 No Nutrition Protocols Good Risk Protocol 0 No interventions needed Moderate Risk Protocol Electronic Signature(s) Signed: 08/09/2016 8:02:52 AM By: Montey Hora Entered By: Montey Hora on 08/09/2016 08:02:52

## 2016-08-09 NOTE — Progress Notes (Addendum)
ASHEA, LAFFITTE ([id number]) Visit Report for 08/09/2016 Allergy List Details Patient Name: [name], [name]. Date of Service: 08/09/2016 8:00 AM Medical Record Number: [id number] Patient Account Number: [id number] Date of Birth/Sex: [birth year] (75 y.o. Female) Treating RN: Montey Hora Primary Care Physician: Park Liter Other Clinician: Referring Physician: Park Liter Treating Physician/Extender: Loistine Chance in Treatment: 0 Allergies Active Allergies amlodipine hydrochlorothiazide Allergy Notes Electronic Signature(s) Signed: 08/09/2016 8:05:53 AM By: Montey Hora Entered By: Montey Hora on 08/09/2016 08:05:52 Music, [name] ([id number]) -------------------------------------------------------------------------------- Arrival Information Details Patient Name: [name] Date of Service: 08/09/2016 8:00 AM Medical Record Number: [id number] Patient Account Number: [id number] Date of Birth/Sex: 04/17/1941 (75 y.o. Female) Treating RN: Montey Hora Primary Care Physician: Park Liter Other Clinician: Referring Physician: Park Liter Treating Physician/Extender: Loistine Chance in Treatment: 0 Visit Information Patient Arrived: Ambulatory Arrival Time: 08:12 Accompanied By: self Transfer Assistance: None Patient Identification Verified: Yes Secondary Verification Process Yes Completed: Patient Has Alerts: Yes Patient Alerts: Patient on Blood Thinner xarelto Electronic Signature(s) Signed: 08/09/2016 5:08:51 PM By: Montey Hora Entered By: Montey Hora on 08/09/2016 08:12:26 Baldonado, [name] ([id number]) -------------------------------------------------------------------------------- Clinic Level of Care Assessment Details Patient Name: [name] Date of Service: 08/09/2016 8:00 AM Medical Record Number: [id number] Patient Account Number: [id number] Date of Birth/Sex: [birth year] (75 y.o. Female) Treating RN: Montey Hora Primary Care  Physician: Park Liter Other Clinician: Referring Physician: Park Liter Treating Physician/Extender: Loistine Chance in Treatment: 0 Clinic Level of Care Assessment Items TOOL 1 Quantity Score []  - Use when EandM and Procedure is performed on INITIAL visit 0 ASSESSMENTS - Nursing Assessment / Reassessment X - General Physical Exam (combine w/ comprehensive assessment (listed just 1 20 below) when performed on new pt. evals) X - Comprehensive Assessment (HX, ROS, Risk Assessments, Wounds Hx, etc.) 1 25 ASSESSMENTS - Wound and Skin Assessment / Reassessment []  - Dermatologic / Skin Assessment (not related to wound area) 0 ASSESSMENTS - Ostomy and/or Continence Assessment and Care []  - Incontinence Assessment and Management 0 []  - Ostomy Care Assessment and Management (repouching, etc.) 0 PROCESS - Coordination of Care X - Simple Patient / Family Education for ongoing care 1 15 []  - Complex (extensive) Patient / Family Education for ongoing care 0 X - Staff obtains Programmer, systems, Records, Test Results / Process Orders 1 10 []  - Staff telephones HHA, Nursing Homes / Clarify orders / etc 0 []  - Routine Transfer to another Facility (non-emergent condition) 0 []  - Routine Hospital Admission (non-emergent condition) 0 X - New Admissions / Biomedical engineer / Ordering NPWT, Apligraf, etc. 1 15 []  - Emergency Hospital Admission (emergent condition) 0 PROCESS - Special Needs []  - Pediatric / Esquer Patient Management 0 []  - Isolation Patient Management 0 Dauria, Kinzee A. ([id number]) []  - Hearing / Language / Visual special needs 0 []  - Assessment of Community assistance (transportation, D/C planning, etc.) 0 []  - Additional assistance / Altered mentation 0 []  - Support Surface(s) Assessment (bed, cushion, seat, etc.) 0 INTERVENTIONS - Miscellaneous []  - External ear exam 0 []  - Patient Transfer (multiple staff / Civil Service fast streamer / Similar devices) 0 []  - Simple Staple / Suture  removal (25 or less) 0 []  - Complex Staple / Suture removal (26 or more) 0 []  - Hypo/Hyperglycemic Management (do not check if billed separately) 0 X - Ankle / Brachial Index (ABI) - do not check if billed separately 1 15 Has the patient been seen at the hospital within the last three years: Yes  Total Score: 100 Level Of Care: New/Established - Level 3 Electronic Signature(s) Signed: 08/09/2016 5:08:51 PM By: Montey Hora Entered By: Montey Hora on 08/09/2016 09:00:56 Knights, [name] ([id number]) -------------------------------------------------------------------------------- Encounter Discharge Information Details Patient Name: [name] Date of Service: 08/09/2016 8:00 AM Medical Record Number: [id number] Patient Account Number: [id number] Date of Birth/Sex: 1941/01/10 (75 y.o. Female) Treating RN: Cornell Barman Primary Care Physician: Park Liter Other Clinician: Referring Physician: Park Liter Treating Physician/Extender: Loistine Chance in Treatment: 0 Encounter Discharge Information Items Discharge Pain Level: 0 Discharge Condition: Stable Ambulatory Status: Ambulatory Discharge Destination: Home Private Transportation: Auto Accompanied By: self Schedule Follow-up Appointment: Yes Medication Reconciliation completed and No provided to Patient/Care Yonas Bunda: Clinical Summary of Care: Provided Form Type Recipient Paper Patient MM Electronic Signature(s) Signed: 08/09/2016 10:59:00 AM By: Montey Hora Previous Signature: 08/09/2016 9:13:43 AM Version By: Lorine Bears RCP, RRT, CHT Entered By: Montey Hora on 08/09/2016 10:59:00 Dakin, [name] ([id number]) -------------------------------------------------------------------------------- Lower Extremity Assessment Details Patient Name: [name] Date of Service: 08/09/2016 8:00 AM Medical Record Number: [id number] Patient Account Number: [id number] Date of Birth/Sex: [birth year] (75 y.o.  Female) Treating RN: Montey Hora Primary Care Physician: Park Liter Other Clinician: Referring Physician: Park Liter Treating Physician/Extender: Loistine Chance in Treatment: 0 Edema Assessment Assessed: [Left: No] [Right: No] Edema: [Left: Yes] [Right: Yes] Calf Left: Right: Point of Measurement: 34 cm From Medial Instep 41 cm 42.1 cm Ankle Left: Right: Point of Measurement: 10 cm From Medial Instep 27.2 cm 25.1 cm Vascular Assessment Pulses: Posterior Tibial Palpable: [Left:Yes] [Right:Yes] Doppler: [Left:Monophasic] [Right:Monophasic] Dorsalis Pedis Palpable: [Left:Yes] [Right:Yes] Doppler: [Left:Monophasic] [Right:Monophasic] Extremity colors, hair growth, and conditions: Extremity Color: [Left:Hyperpigmented] [Right:Hyperpigmented] Hair Growth on Extremity: [Left:Yes] [Right:Yes] Temperature of Extremity: [Left:Warm] [Right:Warm] Capillary Refill: [Left:< 3 seconds] [Right:< 3 seconds] Blood Pressure: Brachial: [Left:144] Dorsalis Pedis: 160 [Left:Dorsalis Pedis: [id number] Ankle: Posterior Tibial: 150 [Left:Posterior Tibial: 1.11] [Right:1.19] Toe Nail Assessment Left: Right: Thick: Yes Yes Discolored: Yes Yes Deformed: No No Improper Length and Hygiene: No No Sahagian, Zariya A. ([id number]) Electronic Signature(s) Signed: 08/09/2016 5:08:51 PM By: Montey Hora Entered By: Montey Hora on 08/09/2016 08:37:21 Jaskot, [name] ([id number]) -------------------------------------------------------------------------------- Multi Wound Chart Details Patient Name: [name] Date of Service: 08/09/2016 8:00 AM Medical Record Number: [id number] Patient Account Number: [id number] Date of Birth/Sex: 1941/05/06 (75 y.o. Female) Treating RN: Montey Hora Primary Care Physician: Park Liter Other Clinician: Referring Physician: Park Liter Treating Physician/Extender: Loistine Chance in Treatment: 0 Vital Signs Height(in): 67 Pulse(bpm):  65 Weight(lbs): 184 Blood Pressure 144/83 (mmHg): Body Mass Index(BMI): 29 Temperature(F): 98.3 Respiratory Rate 18 (breaths/min): Photos: [N/A:N/A] Wound Location: Left Lower Leg - Lateral, Left Lower Leg - Lateral, N/A Proximal Distal Wounding Event: Gradually Appeared Gradually Appeared N/A Primary Etiology: Venous Leg Ulcer Venous Leg Ulcer N/A Comorbid History: Arrhythmia, Hypertension Arrhythmia, Hypertension N/A Date Acquired: 06/03/2016 06/03/2016 N/A Weeks of Treatment: 0 0 N/A Wound Status: Open Open N/A Measurements L x W x D 1.3x0.6x0.1 1.5x1.2x0.1 N/A (cm) Area (cm) : 0.613 1.414 N/A Volume (cm) : 0.061 0.141 N/A % Reduction in Area: 0.00% 0.00% N/A % Reduction in Volume: 0.00% 0.00% N/A Classification: Full Thickness Without Full Thickness Without N/A Exposed Support Exposed Support Structures Structures Exudate Amount: Large Medium N/A Exudate Type: Serous Serous N/A Exudate Color: amber amber N/A Wound Margin: Flat and Intact Flat and Intact N/A Granulation Amount: Medium (34-66%) Medium (34-66%) N/A Granulation Quality: Red Pink N/A Derasmo, Corliss A. ([id number]) Necrotic Amount: Medium (34-66%) Medium (34-66%)  N/A Necrotic Tissue: Eschar, Adherent Slough Eschar, Adherent Slough N/A Exposed Structures: Fascia: No Fascia: No N/A Fat: No Fat: No Tendon: No Tendon: No Muscle: No Muscle: No Joint: No Joint: No Bone: No Bone: No Limited to Skin Limited to Skin Breakdown Breakdown Epithelialization: None None N/A Periwound Skin Texture: Edema: No Edema: No N/A Excoriation: No Excoriation: No Induration: No Induration: No Callus: No Callus: No Crepitus: No Crepitus: No Fluctuance: No Fluctuance: No Friable: No Friable: No Rash: No Rash: No Scarring: No Scarring: No Periwound Skin Moist: Yes Moist: Yes N/A Moisture: Maceration: No Maceration: No Dry/Scaly: No Dry/Scaly: No Periwound Skin Color: Erythema: Yes Atrophie Blanche: No  N/A Atrophie Blanche: No Cyanosis: No Cyanosis: No Ecchymosis: No Ecchymosis: No Erythema: No Hemosiderin Staining: No Hemosiderin Staining: No Mottled: No Mottled: No Pallor: No Pallor: No Rubor: No Rubor: No Erythema Location: Circumferential N/A N/A Tenderness on Yes Yes N/A Palpation: Wound Preparation: Ulcer Cleansing: Ulcer Cleansing: N/A Rinsed/Irrigated with Rinsed/Irrigated with Saline Saline Topical Anesthetic Topical Anesthetic Applied: Other: lidocaine Applied: Other: lidocaine 4% 4% Treatment Notes Electronic Signature(s) Signed: 08/09/2016 5:08:51 PM By: Montey Hora Entered By: Montey Hora on 08/09/2016 08:49:50 Barbier, [name] ([id number]) -------------------------------------------------------------------------------- Multi-Disciplinary Care Plan Details Patient Name: [name] Date of Service: 08/09/2016 8:00 AM Medical Record Number: [id number] Patient Account Number: [id number] Date of Birth/Sex: 1940-11-05 (75 y.o. Female) Treating RN: Montey Hora Primary Care Physician: Park Liter Other Clinician: Referring Physician: Park Liter Treating Physician/Extender: Loistine Chance in Treatment: 0 Active Inactive Abuse / Safety / Falls / Self Care Management Nursing Diagnoses: Impaired physical mobility Potential for falls Goals: Patient will remain injury free Date Initiated: 08/09/2016 Goal Status: Active Interventions: Assess fall risk on admission and as needed Notes: Orientation to the Wound Care Program Nursing Diagnoses: Knowledge deficit related to the wound healing center program Goals: Patient/caregiver will verbalize understanding of the [facility] Date Initiated: 08/09/2016 Goal Status: Active Interventions: Provide education on orientation to the wound center Notes: Wound/Skin Impairment Nursing Diagnoses: Impaired tissue integrity Goals: Patient/caregiver will verbalize understanding  of skin care regimen Kai, Nathalia A. ([id number]) Date Initiated: 08/09/2016 Goal Status: Active Ulcer/skin breakdown will have a volume reduction of 30% by week 4 Date Initiated: 08/09/2016 Goal Status: Active Ulcer/skin breakdown will have a volume reduction of 50% by week 8 Date Initiated: 08/09/2016 Goal Status: Active Ulcer/skin breakdown will have a volume reduction of 80% by week 12 Date Initiated: 08/09/2016 Goal Status: Active Ulcer/skin breakdown will heal within 14 weeks Date Initiated: 08/09/2016 Goal Status: Active Interventions: Assess patient/caregiver ability to obtain necessary supplies Assess patient/caregiver ability to perform ulcer/skin care regimen upon admission and as needed Assess ulceration(s) every visit Notes: Electronic Signature(s) Signed: 08/09/2016 5:08:51 PM By: Montey Hora Entered By: Montey Hora on 08/09/2016 08:48:49 Desha, [name] ([id number]) -------------------------------------------------------------------------------- Pain Assessment Details Patient Name: [name] Date of Service: 08/09/2016 8:00 AM Medical Record Number: [id number] Patient Account Number: [id number] Date of Birth/Sex: Dec 28, 1940 (75 y.o. Female) Treating RN: Montey Hora Primary Care Physician: Park Liter Other Clinician: Referring Physician: Park Liter Treating Physician/Extender: Loistine Chance in Treatment: 0 Active Problems Location of Pain Severity and Description of Pain Patient Has Paino No Site Locations Pain Management and Medication Current Pain Management: Notes Topical or injectable lidocaine is offered to patient for acute pain when surgical debridement is performed. If needed, Patient is instructed to use over the counter pain medication for the following 24-48 hours after debridement. Wound care MDs do not  prescribed pain medications. Patient has chronic pain or uncontrolled pain. Patient has been instructed to make an  appointment with their Primary Care Physician for pain management. Electronic Signature(s) Signed: 08/09/2016 5:08:51 PM By: Montey Hora Entered By: Montey Hora on 08/09/2016 08:12:40 Messman, [name] ([id number]) -------------------------------------------------------------------------------- Patient/Caregiver Education Details Patient Name: [name] Date of Service: 08/09/2016 8:00 AM Medical Record Number: [id number] Patient Account Number: [id number] Date of Birth/Gender: 1941/01/20 (75 y.o. Female) Treating RN: Montey Hora Primary Care Physician: Park Liter Other Clinician: Referring Physician: Park Liter Treating Physician/Extender: Loistine Chance in Treatment: 0 Education Assessment Education Provided To: Patient Education Topics Provided Wound/Skin Impairment: Handouts: Other: wound care as ordered Methods: Demonstration, Explain/Verbal Responses: State content correctly Electronic Signature(s) Signed: 08/09/2016 5:08:51 PM By: Montey Hora Entered By: Montey Hora on 08/09/2016 10:59:15 Snavely, [name] ([id number]) -------------------------------------------------------------------------------- Wound Assessment Details Patient Name: [name] Date of Service: 08/09/2016 8:00 AM Medical Record Number: [id number] Patient Account Number: [id number] Date of Birth/Sex: 06-11-1941 (75 y.o. Female) Treating RN: Montey Hora Primary Care Physician: Park Liter Other Clinician: Referring Physician: Park Liter Treating Physician/Extender: Loistine Chance in Treatment: 0 Wound Status Wound Number: 1 Primary Etiology: Venous Leg Ulcer Wound Location: Left Lower Leg - Lateral, Wound Status: Open Proximal Comorbid History: Arrhythmia, Hypertension Wounding Event: Gradually Appeared Date Acquired: 06/03/2016 Weeks Of Treatment: 0 Clustered Wound: No Photos Wound Measurements Length: (cm) 1.3 Width: (cm) 0.6 Depth: (cm)  0.1 Area: (cm) 0.613 Volume: (cm) 0.061 % Reduction in Area: 0% % Reduction in Volume: 0% Epithelialization: None Tunneling: No Undermining: No Wound Description Full Thickness Without Exposed Foul Odor Aft Classification: Support Structures Wound Margin: Flat and Intact Exudate Large Amount: Exudate Type: Serous Exudate Color: amber er Cleansing: No Wound Bed Granulation Amount: Medium (34-66%) Exposed Structure Granulation Quality: Red Fascia Exposed: No Necrotic Amount: Medium (34-66%) Fat Layer Exposed: No Schwan, Chrisann A. ([id number]) Necrotic Quality: Eschar, Adherent Slough Tendon Exposed: No Muscle Exposed: No Joint Exposed: No Bone Exposed: No Limited to Skin Breakdown Periwound Skin Texture Texture Color No Abnormalities Noted: No No Abnormalities Noted: No Callus: No Atrophie Blanche: No Crepitus: No Cyanosis: No Excoriation: No Ecchymosis: No Fluctuance: No Erythema: Yes Friable: No Erythema Location: Circumferential Induration: No Hemosiderin Staining: No Localized Edema: No Mottled: No Rash: No Pallor: No Scarring: No Rubor: No Moisture Temperature / Pain No Abnormalities Noted: No Tenderness on Palpation: Yes Dry / Scaly: No Maceration: No Moist: Yes Wound Preparation Ulcer Cleansing: Rinsed/Irrigated with Saline Topical Anesthetic Applied: Other: lidocaine 4%, Treatment Notes Wound #1 (Left, Proximal, Lateral Lower Leg) 1. Cleansed with: Clean wound with Normal Saline 2. Anesthetic Topical Lidocaine 4% cream to wound bed prior to debridement 4. Dressing Applied: Medihoney Gel 5. Secondary Dressing Applied Dry [place] Signature(s) Signed: 08/09/2016 5:08:51 PM By: Montey Hora Entered By: Montey Hora on 08/09/2016 08:46:10 Meroney, [name] ([id number]) -------------------------------------------------------------------------------- Wound Assessment Details Patient Name: [name] Date of  Service: 08/09/2016 8:00 AM Medical Record Number: [id number] Patient Account Number: [id number] Date of Birth/Sex: Oct 05, 1941 (75 y.o. Female) Treating RN: Montey Hora Primary Care Physician: Park Liter Other Clinician: Referring Physician: Park Liter Treating Physician/Extender: Loistine Chance in Treatment: 0 Wound Status Wound Number: 2 Primary Etiology: Venous Leg Ulcer Wound Location: Left Lower Leg - Lateral, Distal Wound Status: Open Wounding Event: Gradually Appeared Comorbid History: Arrhythmia, Hypertension Date Acquired: 06/03/2016 Weeks Of Treatment: 0 Clustered Wound: No Photos Wound Measurements Length: (cm) 1.5 Width: (cm) 1.2 Depth: (cm) 0.1  Area: (cm) 1.414 Volume: (cm) 0.141 % Reduction in Area: 0% % Reduction in Volume: 0% Epithelialization: None Tunneling: No Undermining: No Wound Description Full Thickness Without Exposed Classification: Support Structures Wound Margin: Flat and Intact Exudate Medium Amount: Exudate Type: Serous Exudate Color: amber Foul Odor After Cleansing: No Wound Bed Granulation Amount: Medium (34-66%) Exposed Structure Granulation Quality: Pink Fascia Exposed: No Necrotic Amount: Medium (34-66%) Fat Layer Exposed: No Vezina, Vitalia A. ([id number]) Necrotic Quality: Eschar, Adherent Slough Tendon Exposed: No Muscle Exposed: No Joint Exposed: No Bone Exposed: No Limited to Skin Breakdown Periwound Skin Texture Texture Color No Abnormalities Noted: No No Abnormalities Noted: No Callus: No Atrophie Blanche: No Crepitus: No Cyanosis: No Excoriation: No Ecchymosis: No Fluctuance: No Erythema: No Friable: No Hemosiderin Staining: No Induration: No Mottled: No Localized Edema: No Pallor: No Rash: No Rubor: No Scarring: No Temperature / Pain Moisture Tenderness on Palpation: Yes No Abnormalities Noted: No Dry / Scaly: No Maceration: No Moist: Yes Wound Preparation Ulcer Cleansing:  Rinsed/Irrigated with Saline Topical Anesthetic Applied: Other: lidocaine 4%, Treatment Notes Wound #2 (Left, Distal, Lateral Lower Leg) 1. Cleansed with: Clean wound with Normal Saline 2. Anesthetic Topical Lidocaine 4% cream to wound bed prior to debridement 4. Dressing Applied: Medihoney Gel 5. Secondary Dressing Applied Dry [place] Signature(s) Signed: 08/09/2016 5:08:51 PM By: Montey Hora Entered By: Montey Hora on 08/09/2016 08:46:31 Tall, [name] ([id number]) -------------------------------------------------------------------------------- Vitals Details Patient Name: [name] Date of Service: 08/09/2016 8:00 AM Medical Record Number: [id number] Patient Account Number: [id number] Date of Birth/Sex: 01/05/1941 (75 y.o. Female) Treating RN: Montey Hora Primary Care Physician: Park Liter Other Clinician: Referring Physician: Park Liter Treating Physician/Extender: Loistine Chance in Treatment: 0 Vital Signs Time Taken: 08:12 Temperature (F): 98.3 Height (in): 67 Pulse (bpm): 65 Source: Stated Respiratory Rate (breaths/min): 18 Weight (lbs): 184 Blood Pressure (mmHg): 144/83 Source: Measured Reference Range: 80 - 120 mg / dl Body Mass Index (BMI): 28.8 Electronic Signature(s) Signed: 08/09/2016 5:08:51 PM By: Montey Hora Entered By: Montey Hora on 08/09/2016 08:15:05

## 2016-08-10 NOTE — Progress Notes (Addendum)
DAVINITY, ROMINE ([id number]) Visit Report for 08/09/2016 Chief Complaint Document Details Patient Name: [name], [name]. Date of Service: 08/09/2016 8:00 AM Medical Record Number: [id number] Patient Account Number: [id number] Date of Birth/Sex: [birth year] (75 y.o. Female) Treating RN: Cornell Barman Primary Care Physician: Park Liter Other Clinician: Referring Physician: Park Liter Treating Physician/Extender: Loistine Chance in Treatment: 0 Information Obtained from: Patient Chief Complaint Referral for left lower leg ulcers Electronic Signature(s) Signed: 08/09/2016 4:21:25 PM By: Londell Moh FNP Entered By: Londell Moh on 08/09/2016 09:06:52 Caiazzo, [name] ([id number]) -------------------------------------------------------------------------------- Debridement Details Patient Name: [name] Date of Service: 08/09/2016 8:00 AM Medical Record Number: [id number] Patient Account Number: [id number] Date of Birth/Sex: 1940-11-15 (75 y.o. Female) Treating RN: Montey Hora Primary Care Physician: Park Liter Other Clinician: Referring Physician: Park Liter Treating Physician/Extender: Loistine Chance in Treatment: 0 Debridement Performed for Wound #2 Left,Distal,Lateral Lower Leg Assessment: Performed By: Physician Londell Moh, NP Debridement: Debridement Pre-procedure Yes - 08:50 Verification/Time Out Taken: Start Time: 08:50 Pain Control: Lidocaine 4% Topical Solution Level: Skin/Subcutaneous Tissue Total Area Debrided (L x 1.5 (cm) x 1.2 (cm) = 1.8 (cm) W): Tissue and other Viable, Non-Viable, Eschar, Fibrin/Slough, Subcutaneous material debrided: Instrument: Curette Specimen: Swab Number of Specimens 1 Taken: Bleeding: Minimum Hemostasis Achieved: Pressure End Time: 08:52 Procedural Pain: 0 Post Procedural Pain: 0 Response to Treatment: Procedure was tolerated well Post Debridement Measurements of Total Wound Length: (cm)  1.5 Width: (cm) 1.2 Depth: (cm) 0.3 Volume: (cm) 0.424 Character of Wound/Ulcer Post Improved Debridement: Severity of Tissue Post Debridement: Fat layer exposed Post Procedure Diagnosis Same as Pre-procedure Electronic Signature(s) Signed: 08/09/2016 4:21:25 PM By: Londell Moh [place]. ([id number]) Signed: 08/09/2016 5:08:51 PM By: Montey Hora Entered By: Montey Hora on 08/09/2016 08:53:57 Coger, [name] ([id number]) -------------------------------------------------------------------------------- Debridement Details Patient Name: [name] Date of Service: 08/09/2016 8:00 AM Medical Record Number: [id number] Patient Account Number: [id number] Date of Birth/Sex: 1941-07-25 (75 y.o. Female) Treating RN: Montey Hora Primary Care Physician: Park Liter Other Clinician: Referring Physician: Park Liter Treating Physician/Extender: Loistine Chance in Treatment: 0 Debridement Performed for Wound #1 Left,Proximal,Lateral Lower Leg Assessment: Performed By: Physician Londell Moh, NP Debridement: Debridement Pre-procedure Yes - 08:52 Verification/Time Out Taken: Start Time: 08:52 Pain Control: Lidocaine 4% Topical Solution Level: Skin/Subcutaneous Tissue Total Area Debrided (L x 1.3 (cm) x 0.6 (cm) = 0.78 (cm) W): Tissue and other Viable, Non-Viable, Eschar, Fibrin/Slough, Subcutaneous material debrided: Instrument: Curette Bleeding: Minimum Hemostasis Achieved: Pressure End Time: 08:54 Procedural Pain: 0 Post Procedural Pain: 0 Response to Treatment: Procedure was tolerated well Post Debridement Measurements of Total Wound Length: (cm) 1.3 Width: (cm) 0.6 Depth: (cm) 0.2 Volume: (cm) 0.123 Character of Wound/Ulcer Post Improved Debridement: Severity of Tissue Post Debridement: Fat layer exposed Post Procedure Diagnosis Same as Pre-procedure Electronic Signature(s) Signed: 08/09/2016 4:21:25 PM By: Londell Moh  FNP Signed: 08/09/2016 5:08:51 PM By: Montey Hora Entered By: Montey Hora on 08/09/2016 08:54:38 Rathod, [name] ([id number]) Tewksbury, [name] ([id number]) -------------------------------------------------------------------------------- HPI Details Patient Name: [name] Date of Service: 08/09/2016 8:00 AM Medical Record Number: [id number] Patient Account Number: [id number] Date of Birth/Sex: [birth year] (75 y.o. Female) Treating RN: Cornell Barman Primary Care Physician: Park Liter Other Clinician: Referring Physician: Park Liter Treating Physician/Extender: Loistine Chance in Treatment: 0 History of Present Illness Location: Patient presents with wounds to left lower leg. Quality: stinging Severity: mild Duration: > 2 months Timing: Pain in wound is Intermittent (comes and goes Context: The wound  appeared gradually over time Modifying Factors: warm compresses, neosporin, PAD, BLE swelling Associated Signs and Symptoms: redness, drainage, pain HPI Description: This is a pleasant 75 y/o elderly female who was referred today for a chronic non healing ulcers to the left lower leg. Gradually appeared approximately 2 months ago. Self treating with warm compresses and neosporin. She was seen by her PCP on 08/06/16 that prompted referral to our clinic for evaluation and on going management. She denies fever, chills, body aches or malaise. Hx of chronic BLE edema. She denies hx of Diabetes. Positive for HTN and atrial fibrillation. Remote hx of smoking but quit in January of 1980. She denies claudication. She admits to slow healing of wounds. She also notes a resolving macular rash on the RLE. Electronic Signature(s) Signed: 08/09/2016 4:21:25 PM By: Londell Moh FNP Entered By: Londell Moh on 08/09/2016 09:03:10 Bissette, [name] ([id number]) -------------------------------------------------------------------------------- Physical Exam Details Patient Name: [name] Date of Service: 08/09/2016 8:00 AM Medical Record Number: [id number] Patient Account Number: [id number] Date of Birth/Sex: 04/13/1941 (75 y.o. Female) Treating RN: Cornell Barman Primary Care Physician: Park Liter Other Clinician: Referring Physician: Park Liter Treating Physician/Extender: Loistine Chance in Treatment: 0 Constitutional Patient's appearance is neat and clean. Appears in no acute distress. Well nourished and well developed.. Eyes Conjunctivae clear. No discharge.. Ears, Nose, Mouth, and Throat External ears and nose are within normal limits No lesions present.. Patient can hear normal speaking tones without difficulty.. Cardiovascular Pedal pulses palpable and strong bilaterally.. Varicosities present bilaterally. there is 1 + non pitting edema of the left lower leg. BLE and feet are warm and appear well perfused.Marland Kitchen Psychiatric Judgement and insight intact.. Alert and oriented times 3.. No evidence of depression, anxiety, or agitation. Calm, cooperative, and communicative. Appropriate interactions and affect.. Electronic Signature(s) Signed: 08/09/2016 4:21:25 PM By: Londell Moh FNP Entered By: Londell Moh on 08/09/2016 09:04:10 Ponds, [name] ([id number]) -------------------------------------------------------------------------------- Physician Orders Details Patient Name: [name] Date of Service: 08/09/2016 8:00 AM Medical Record Number: [id number] Patient Account Number: [id number] Date of Birth/Sex: 1940/12/12 (75 y.o. Female) Treating RN: Montey Hora Primary Care Physician: Park Liter Other Clinician: Referring Physician: Park Liter Treating Physician/Extender: Loistine Chance in Treatment: 0 Verbal / Phone Orders: Yes Clinician: Montey Hora Read Back and Verified: Yes Diagnosis Coding ICD-10 Coding Code Description [phone] Non-pressure chronic ulcer of other part of left lower leg with unspecified  severity I73.9 Peripheral vascular disease, unspecified I87.312 Chronic venous hypertension (idiopathic) with ulcer of left lower extremity Wound Cleansing Wound #1 Left,Proximal,Lateral Lower Leg o Clean wound with Normal Saline. o May Shower, gently pat wound dry prior to applying new dressing. Wound #2 Left,Distal,Lateral Lower Leg o Clean wound with Normal Saline. o May Shower, gently pat wound dry prior to applying new dressing. Anesthetic Wound #1 Left,Proximal,Lateral Lower Leg o Topical Lidocaine 4% cream applied to wound bed prior to debridement Wound #2 Left,Distal,Lateral Lower Leg o Topical Lidocaine 4% cream applied to wound bed prior to debridement Primary Wound Dressing Wound #1 Left,Proximal,Lateral Lower Leg o Medihoney gel Wound #2 Left,Distal,Lateral Lower Leg o Medihoney gel Secondary Dressing Wound #1 Left,Proximal,Lateral Lower Leg o Dry Gauze o Boardered Foam Dressing - or telfa island dressing Wound #2 Left,Distal,Lateral Lower Leg o Dry Gauze Jurgensen, Salle A. ([id number]) o Boardered Foam Dressing - or telfa island dressing Dressing Change Frequency Wound #1 Left,Proximal,Lateral Lower Leg o Change dressing every day. Wound #2 Left,Distal,Lateral Lower Leg o Change dressing every day. Follow-up Appointments Wound #  1 Left,Proximal,Lateral Lower Leg o Return Appointment in 1 week. Wound #2 Left,Distal,Lateral Lower Leg o Return Appointment in 1 week. Edema Control Wound #1 Left,Proximal,Lateral Lower Leg o Elevate legs to the level of the heart and pump ankles as often as possible Wound #2 Left,Distal,Lateral Lower Leg o Elevate legs to the level of the heart and pump ankles as often as possible Laboratory o Bacteria identified in Wound by Culture (MICRO) oooo LOINC Code: [id number] oooo Convenience Name: Wound culture routine Radiology o X-ray, lower leg Services and Therapies o Venous Studies  -Bilateral Electronic Signature(s) Signed: 08/09/2016 9:07:35 AM By: Londell Moh FNP Entered By: Londell Moh on 08/09/2016 09:07:35 Funez, [name] ([id number]) -------------------------------------------------------------------------------- Problem List Details Patient Name: [name] Date of Service: 08/09/2016 8:00 AM Medical Record Number: [id number] Patient Account Number: [id number] Date of Birth/Sex: 26-Jan-1941 (75 y.o. Female) Treating RN: Cornell Barman Primary Care Physician: Park Liter Other Clinician: Referring Physician: Park Liter Treating Physician/Extender: Loistine Chance in Treatment: 0 Active Problems ICD-10 Encounter Code Description Active Date Diagnosis L97.829 Non-pressure chronic ulcer of other part of left lower leg 08/09/2016 Yes with unspecified severity I73.9 Peripheral vascular disease, unspecified 08/09/2016 Yes I87.312 Chronic venous hypertension (idiopathic) with ulcer of left 08/09/2016 Yes lower extremity Inactive Problems Resolved Problems Electronic Signature(s) Signed: 08/09/2016 4:21:25 PM By: Londell Moh FNP Entered By: Londell Moh on 08/09/2016 09:05:19 [place]assa, [name] ([id number]) -------------------------------------------------------------------------------- Progress Note Details Patient Name: [name] Date of Service: 08/09/2016 8:00 AM Medical Record Number: [id number] Patient Account Number: [id number] Date of Birth/Sex: August 02, 1941 (75 y.o. Female) Treating RN: Cornell Barman Primary Care Physician: Park Liter Other Clinician: Referring Physician: Park Liter Treating Physician/Extender: Loistine Chance in Treatment: 0 Subjective Chief Complaint Information obtained from Patient Referral for left lower leg ulcers History of Present Illness (HPI) The following HPI elements were documented for the patient's wound: Location: Patient presents with wounds to left lower leg. Quality:  stinging Severity: mild Duration: > 2 months Timing: Pain in wound is Intermittent (comes and goes Context: The wound appeared gradually over time Modifying Factors: warm compresses, neosporin, PAD, BLE swelling Associated Signs and Symptoms: redness, drainage, pain This is a pleasant 75 y/o elderly female who was referred today for a chronic non healing ulcers to the left lower leg. Gradually appeared approximately 2 months ago. Self treating with warm compresses and neosporin. She was seen by her PCP on 08/06/16 that prompted referral to our clinic for evaluation and on going management. She denies fever, chills, body aches or malaise. Hx of chronic BLE edema. She denies hx of Diabetes. Positive for HTN and atrial fibrillation. Remote hx of smoking but quit in January of 1980. She denies claudication. She admits to slow healing of wounds. She also notes a resolving macular rash on the RLE. Wound History Patient presents with 1 open wound that has been present for approximately 2 months. Patient has been treating wound in the following manner: neosporin and bandaid. Laboratory tests have not been performed in the last month. Patient reportedly has not tested positive for an antibiotic resistant organism. Patient reportedly has not tested positive for osteomyelitis. Patient reportedly has not had testing performed to evaluate circulation in the legs. Patient experiences the following problems associated with their wounds: infection. Patient History Information obtained from Patient. Allergies amlodipine, hydrochlorothiazide Dworkin, Lajune A. ([id number]) Social History Former smoker - quit 30 years ago, Marital Status - Widowed, Alcohol Use - Never, Drug Use - No History, Caffeine Use - Moderate.  Medical History Cardiovascular Patient has history of Arrhythmia - a fib, Hypertension Oncologic Denies history of Received Chemotherapy, Received Radiation Medical And Surgical History  Notes Cardiovascular PVD per PCP Review of Systems (ROS) Constitutional Symptoms (General Health) The patient has no complaints or symptoms. Eyes The patient has no complaints or symptoms. Ear/Nose/Mouth/Throat The patient has no complaints or symptoms. Hematologic/Lymphatic The patient has no complaints or symptoms. Respiratory The patient has no complaints or symptoms. Gastrointestinal The patient has no complaints or symptoms. Endocrine The patient has no complaints or symptoms. Genitourinary The patient has no complaints or symptoms. Immunological The patient has no complaints or symptoms. Integumentary (Skin) The patient has no complaints or symptoms. Musculoskeletal The patient has no complaints or symptoms. Neurologic The patient has no complaints or symptoms. Oncologic The patient has no complaints or symptoms. Psychiatric The patient has no complaints or symptoms. Person, Jesi A. ([id number]) Objective Constitutional Patient's appearance is neat and clean. Appears in no acute distress. Well nourished and well developed.. Vitals Time Taken: 8:12 AM, Height: 67 in, Source: Stated, Weight: 184 lbs, Source: Measured, BMI: 28.8, Temperature: 98.3 F, Pulse: 65 bpm, Respiratory Rate: 18 breaths/min, Blood Pressure: 144/83 mmHg. Eyes Conjunctivae clear. No discharge.. Ears, Nose, Mouth, and Throat External ears and nose are within normal limits No lesions present.. Patient can hear normal speaking tones without difficulty.. Cardiovascular Pedal pulses palpable and strong bilaterally.. Varicosities present bilaterally. there is 1 + non pitting edema of the left lower leg. BLE and feet are warm and appear well perfused.Marland Kitchen Psychiatric Judgement and insight intact.. Alert and oriented times 3.. No evidence of depression, anxiety, or agitation. Calm, cooperative, and communicative. Appropriate interactions and affect.. Integumentary (Hair, Skin) Wound #1 status is Open.  Original cause of wound was Gradually Appeared. The wound is located on the Left,Proximal,Lateral Lower Leg. The wound measures 1.3cm length x 0.6cm width x 0.1cm depth; 0.613cm^2 area and 0.061cm^3 volume. The wound is limited to skin breakdown. There is no tunneling or undermining noted. There is a large amount of serous drainage noted. The wound margin is flat and intact. There is medium (34-66%) red granulation within the wound bed. There is a medium (34-66%) amount of necrotic tissue within the wound bed including Eschar and Adherent Slough. The periwound skin appearance exhibited: Moist, Erythema. The periwound skin appearance did not exhibit: Callus, Crepitus, Excoriation, Fluctuance, Friable, Induration, Localized Edema, Rash, Scarring, Dry/Scaly, Maceration, Atrophie Blanche, Cyanosis, Ecchymosis, Hemosiderin Staining, Mottled, Pallor, Rubor. The surrounding wound skin color is noted with erythema which is circumferential. The periwound has tenderness on palpation. Wound #2 status is Open. Original cause of wound was Gradually Appeared. The wound is located on the Left,Distal,Lateral Lower Leg. The wound measures 1.5cm length x 1.2cm width x 0.1cm depth; 1.414cm^2 area and 0.141cm^3 volume. The wound is limited to skin breakdown. There is no tunneling or undermining noted. There is a medium amount of serous drainage noted. The wound margin is flat and intact. There is medium (34-66%) pink granulation within the wound bed. There is a medium (34-66%) amount of necrotic tissue within the wound bed including Eschar and Adherent Slough. The periwound skin appearance exhibited: Moist. The periwound skin appearance did not exhibit: Callus, Crepitus, Excoriation, Fluctuance, Friable, Induration, Localized Edema, Rash, Scarring, Dry/Scaly, Maceration, Atrophie Blanche, Cyanosis, Ecchymosis, Hemosiderin Staining, Mottled, Pallor, Rubor, Erythema. The periwound has tenderness on Vassey, Shanna A.  ([id number]) palpation. Assessment Active Problems ICD-10 L97.829 - Non-pressure chronic ulcer of other part of left lower leg with unspecified severity  I73.9 - Peripheral vascular disease, unspecified I87.312 - Chronic venous hypertension (idiopathic) with ulcer of left lower extremity Diagnoses ICD-10 L97.829: Non-pressure chronic ulcer of other part of left lower leg with unspecified severity I73.9: Peripheral vascular disease, unspecified I87.312: Chronic venous hypertension (idiopathic) with ulcer of left lower extremity Procedures Wound #1 Wound #1 is a Venous Leg Ulcer located on the Left,Proximal,Lateral Lower Leg . There was a Skin/Subcutaneous Tissue Debridement [id number]) debridement with total area of 0.78 sq cm performed by Londell Moh, NP. with the following instrument(s): Curette to remove Viable and Non- Viable tissue/material including Fibrin/Slough, Eschar, and Subcutaneous after achieving pain control using Lidocaine 4% Topical Solution. A time out was conducted at 08:52, prior to the start of the procedure. A Minimum amount of bleeding was controlled with Pressure. The procedure was tolerated well with a pain level of 0 throughout and a pain level of 0 following the procedure. Post Debridement Measurements: 1.3cm length x 0.6cm width x 0.2cm depth; 0.123cm^3 volume. Character of Wound/Ulcer Post Debridement is improved. Severity of Tissue Post Debridement is: Fat layer exposed. Post procedure Diagnosis Wound #1: Same as Pre-Procedure Wound #2 Wound #2 is a Venous Leg Ulcer located on the Left,Distal,Lateral Lower Leg . There was a Skin/Subcutaneous Tissue Debridement [id number]) debridement with total area of 1.8 sq cm performed by Londell Moh, NP. with the following instrument(s): Curette to remove Viable and Non-Viable tissue/material including Fibrin/Slough, Eschar, and Subcutaneous after achieving pain control using Lidocaine 4% Topical Solution. 1  Specimen was taken by a Swab and sent to the lab per facility protocol.A time out was conducted at 08:50, prior to the start of the procedure. A Minimum amount of bleeding was controlled with Pressure. The procedure was tolerated well with a pain level of 0 throughout and a pain level of 0 following the procedure. Post Debridement Measurements: 1.5cm length x 1.2cm width x 0.3cm depth; Liby, Kruti A. ([id number]) 0.424cm^3 volume. Character of Wound/Ulcer Post Debridement is improved. Severity of Tissue Post Debridement is: Fat layer exposed. Post procedure Diagnosis Wound #2: Same as Pre-Procedure Plan Wound Cleansing: Wound #1 Left,Proximal,Lateral Lower Leg: Clean wound with Normal Saline. May Shower, gently pat wound dry prior to applying new dressing. Wound #2 Left,Distal,Lateral Lower Leg: Clean wound with Normal Saline. May Shower, gently pat wound dry prior to applying new dressing. Anesthetic: Wound #1 Left,Proximal,Lateral Lower Leg: Topical Lidocaine 4% cream applied to wound bed prior to debridement Wound #2 Left,Distal,Lateral Lower Leg: Topical Lidocaine 4% cream applied to wound bed prior to debridement Primary Wound Dressing: Wound #1 Left,Proximal,Lateral Lower Leg: Medihoney gel Wound #2 Left,Distal,Lateral Lower Leg: Medihoney gel Secondary Dressing: Wound #1 Left,Proximal,Lateral Lower Leg: Dry Gauze Boardered Foam Dressing - or telfa island dressing Wound #2 Left,Distal,Lateral Lower Leg: Dry Gauze Boardered Foam Dressing - or telfa island dressing Dressing Change Frequency: Wound #1 Left,Proximal,Lateral Lower Leg: Change dressing every day. Wound #2 Left,Distal,Lateral Lower Leg: Change dressing every day. Follow-up Appointments: Wound #1 Left,Proximal,Lateral Lower Leg: Return Appointment in 1 week. Wound #2 Left,Distal,Lateral Lower Leg: Return Appointment in 1 week. Edema Control: Wound #1 Left,Proximal,Lateral Lower Leg: Elevate legs to the  level of the heart and pump ankles as often as possible Wound #2 Left,Distal,Lateral Lower Leg: Creps, Nyasha A. ([id number]) Elevate legs to the level of the heart and pump ankles as often as possible Laboratory ordered were: Wound culture routine Services and Therapies ordered were: Venous Studies -Bilateral Radiology ordered were: X-ray, lower leg Follow-Up Appointments: A follow-up appointment should be scheduled.  A Patient Clinical Summary of Care was provided to MM 1. discussed clinical findings with pt. all questions were answered. 2. counseling and teaching provided regarding wounds, healing, diagnostics, imaging, etc. 3. Swab wound culture obtained. 4. Level 2 debridement performed today. 5. see orders above. Electronic Signature(s) Signed: 08/28/2016 3:51:58 PM By: Londell Moh FNP Previous Signature: 08/09/2016 4:21:25 PM Version By: Londell Moh FNP Entered By: Londell Moh on 08/28/2016 15:51:57 Debold, [name] ([id number]) -------------------------------------------------------------------------------- ROS/PFSH Details Patient Name: [name] Date of Service: 08/09/2016 8:00 AM Medical Record Number: [id number] Patient Account Number: [id number] Date of Birth/Sex: [birth year] (75 y.o. Female) Treating RN: Montey Hora Primary Care Physician: Park Liter Other Clinician: Referring Physician: Park Liter Treating Physician/Extender: Loistine Chance in Treatment: 0 Information Obtained From Patient Wound History Do you currently have one or more open woundso Yes How many open wounds do you currently haveo 1 Approximately how long have you had your woundso 2 months How have you been treating your wound(s) until nowo neosporin and bandaid Has your wound(s) ever healed and then re-openedo No Have you had any lab work done in the past montho No Have you tested positive for an antibiotic resistant organism (MRSA, VRE)o No Have you tested  positive for osteomyelitis (bone infection)o No Have you had any tests for circulation on your legso No Have you had other problems associated with your woundso Infection Constitutional Symptoms (General Health) Complaints and Symptoms: No Complaints or Symptoms Eyes Complaints and Symptoms: No Complaints or Symptoms Ear/Nose/Mouth/Throat Complaints and Symptoms: No Complaints or Symptoms Hematologic/Lymphatic Complaints and Symptoms: No Complaints or Symptoms Respiratory Complaints and Symptoms: No Complaints or Symptoms Cardiovascular Medical History: Positive for: Arrhythmia - a fib; Hypertension Mccurley, Xandria A. ([id number]) Past Medical History Notes: PVD per PCP Gastrointestinal Complaints and Symptoms: No Complaints or Symptoms Endocrine Complaints and Symptoms: No Complaints or Symptoms Genitourinary Complaints and Symptoms: No Complaints or Symptoms Immunological Complaints and Symptoms: No Complaints or Symptoms Integumentary (Skin) Complaints and Symptoms: No Complaints or Symptoms Musculoskeletal Complaints and Symptoms: No Complaints or Symptoms Neurologic Complaints and Symptoms: No Complaints or Symptoms Oncologic Complaints and Symptoms: No Complaints or Symptoms Medical History: Negative for: Received Chemotherapy; Received Radiation Psychiatric Complaints and Symptoms: No Complaints or Symptoms Immunizations Worthing, Phyllis A. ([id number]) Pneumococcal Vaccine: Received Pneumococcal Vaccination: Yes Immunization Notes: up to date Family and Social History Former smoker - quit 30 years ago; Marital Status - Widowed; Alcohol Use: Never; Drug Use: No History; Caffeine Use: Moderate; Financial Concerns: No; Food, Clothing or Shelter Needs: No; Support System Lacking: No; Transportation Concerns: No; Advanced Directives: No; Patient does not want information on Advanced Directives Electronic Signature(s) Signed: 08/09/2016 4:21:25 PM By: Londell Moh FNP Signed: 08/09/2016 5:08:51 PM By: Montey Hora Entered By: Montey Hora on 08/09/2016 08:17:05 Dowling, [name] ([id number]) -------------------------------------------------------------------------------- SuperBill Details Patient Name: [name] Date of Service: 08/09/2016 Medical Record Number: [id number] Patient Account Number: [id number] Date of Birth/Sex: [birth year] (75 y.o. Female) Treating RN: Cornell Barman Primary Care Physician: Park Liter Other Clinician: Referring Physician: Park Liter Treating Physician/Extender: Loistine Chance in Treatment: 0 Diagnosis Coding ICD-10 Codes Code Description [phone] Non-pressure chronic ulcer of other part of left lower leg with unspecified severity I73.9 Peripheral vascular disease, unspecified I87.312 Chronic venous hypertension (idiopathic) with ulcer of left lower extremity Facility Procedures CPT4: Description Modifier Quantity Code [id number] - WOUND CARE VISIT-LEV 3 EST PT 1 CPT4: [id number] - DEB SUBQ TISSUE 20 SQ CM/< 1 ICD-10 Description Diagnosis L97.829  Non-pressure chronic ulcer of other part of left lower leg with unspecified severity Physician Procedures CPT4: Description Modifier Quantity Code [id number] - WC PHYS LEVEL 4 - NEW PT 25 1 ICD-10 Description Diagnosis L97.829 Non-pressure chronic ulcer of other part of left lower leg with unspecified severity I87.312 Chronic venous hypertension  (idiopathic) with ulcer of left lower extremity I73.9 Peripheral vascular disease, unspecified CPT4: [id number] - WC PHYS SUBQ TISS 20 SQ CM 1 ICD-10 Description Diagnosis L97.829 Non-pressure chronic ulcer of other part of left lower leg with unspecified severity Tijerina, Hasana AMarland Kitchen ([id number]) Electronic Signature(s) Signed: 08/09/2016 4:21:25 PM By: Londell Moh FNP Entered By: Londell Moh on 08/09/2016 09:10:06

## 2016-08-14 LAB — AEROBIC/ANAEROBIC CULTURE (SURGICAL/DEEP WOUND)
CULTURE: NORMAL
GRAM STAIN: NONE SEEN

## 2016-08-14 LAB — AEROBIC/ANAEROBIC CULTURE W GRAM STAIN (SURGICAL/DEEP WOUND)

## 2016-08-16 ENCOUNTER — Encounter: Payer: Medicare PPO | Admitting: Nurse Practitioner

## 2016-08-16 DIAGNOSIS — I87312 Chronic venous hypertension (idiopathic) with ulcer of left lower extremity: Secondary | ICD-10-CM | POA: Diagnosis not present

## 2016-08-17 NOTE — Progress Notes (Signed)
[name], [name] ([id number]) Visit Report for 08/16/2016 Arrival Information Details Patient Name: [name], [name]. Date of Service: 08/16/2016 1:30 PM Medical Record Number: [id number] Patient Account Number: [id number] Date of Birth/Sex: 04-24-1941 (75 y.o. Female) Treating RN: Montey Hora Primary Care Physician: Park Liter Other Clinician: Referring Physician: Park Liter Treating Physician/Extender: Loistine Chance in Treatment: 1 Visit Information History Since Last Visit Added or deleted any medications: No Patient Arrived: Ambulatory Any new allergies or adverse reactions: No Arrival Time: 13:53 Had a fall or experienced change in No Accompanied By: self activities of daily living that may affect Transfer Assistance: None risk of falls: Patient Identification Verified: Yes Signs or symptoms of abuse/neglect since last No Secondary Verification Process Yes visito Completed: Hospitalized since last visit: No Patient Has Alerts: Yes Pain Present Now: No Patient Alerts: Patient on Blood Thinner xarelto Electronic Signature(s) Signed: 08/16/2016 5:50:35 PM By: Montey Hora Entered By: Montey Hora on 08/16/2016 13:54:04 [name] ([id number]) -------------------------------------------------------------------------------- Clinic Level of Care Assessment Details Patient Name: [name] Date of Service: 08/16/2016 1:30 PM Medical Record Number: [id number] Patient Account Number: [id number] Date of Birth/Sex: 09-May-1941 (75 y.o. Female) Treating RN: Montey Hora Primary Care Physician: Park Liter Other Clinician: Referring Physician: Park Liter Treating Physician/Extender: Loistine Chance in Treatment: 1 Clinic Level of Care Assessment Items TOOL 4 Quantity Score []  - Use when only an EandM is performed on FOLLOW-UP visit 0 ASSESSMENTS - Nursing Assessment / Reassessment X - Reassessment of Co-morbidities (includes updates in  patient status) 1 10 X - Reassessment of Adherence to Treatment Plan 1 5 ASSESSMENTS - Wound and Skin Assessment / Reassessment []  - Simple Wound Assessment / Reassessment - one wound 0 X - Complex Wound Assessment / Reassessment - multiple wounds 2 5 []  - Dermatologic / Skin Assessment (not related to wound area) 0 ASSESSMENTS - Focused Assessment []  - Circumferential Edema Measurements - multi extremities 0 []  - Nutritional Assessment / Counseling / Intervention 0 X - Lower Extremity Assessment (monofilament, tuning fork, pulses) 1 5 []  - Peripheral Arterial Disease Assessment (using hand held doppler) 0 ASSESSMENTS - Ostomy and/or Continence Assessment and Care []  - Incontinence Assessment and Management 0 []  - Ostomy Care Assessment and Management (repouching, etc.) 0 PROCESS - Coordination of Care X - Simple Patient / Family Education for ongoing care 1 15 []  - Complex (extensive) Patient / Family Education for ongoing care 0 []  - Staff obtains Programmer, systems, Records, Test Results / Process Orders 0 []  - Staff telephones HHA, Nursing Homes / Clarify orders / etc 0 []  - Routine Transfer to another Facility (non-emergent condition) 0 [name], [name] ([id number]) []  - Routine Hospital Admission (non-emergent condition) 0 []  - New Admissions / Biomedical engineer / Ordering NPWT, Apligraf, etc. 0 []  - Emergency Hospital Admission (emergent condition) 0 X - Simple Discharge Coordination 1 10 []  - Complex (extensive) Discharge Coordination 0 PROCESS - Special Needs []  - Pediatric / [facility] Patient Management 0 []  - Isolation Patient Management 0 []  - Hearing / Language / Visual special needs 0 []  - Assessment of Community assistance (transportation, D/C planning, etc.) 0 []  - Additional assistance / Altered mentation 0 []  - Support Surface(s) Assessment (bed, cushion, seat, etc.) 0 INTERVENTIONS - Wound Cleansing / Measurement []  - Simple Wound Cleansing - one wound 0 X - Complex Wound  Cleansing - multiple wounds 2 5 X - Wound Imaging (photographs - any number of wounds) 1 5 []  - Wound Tracing (instead of photographs)  0 []  - Simple Wound Measurement - one wound 0 X - Complex Wound Measurement - multiple wounds 2 5 INTERVENTIONS - Wound Dressings X - Small Wound Dressing one or multiple wounds 2 10 []  - Medium Wound Dressing one or multiple wounds 0 []  - Large Wound Dressing one or multiple wounds 0 X - Application of Medications - topical 1 5 []  - Application of Medications - injection 0 INTERVENTIONS - Miscellaneous []  - External ear exam 0 [name], [name] ([id number]) []  - Specimen Collection (cultures, biopsies, blood, body fluids, etc.) 0 []  - Specimen(s) / Culture(s) sent or taken to Lab for analysis 0 []  - Patient Transfer (multiple staff / Harrel Lemon Lift / Similar devices) 0 []  - Simple Staple / Suture removal (25 or less) 0 []  - Complex Staple / Suture removal (26 or more) 0 []  - Hypo / Hyperglycemic Management (close monitor of Blood Glucose) 0 []  - Ankle / Brachial Index (ABI) - do not check if billed separately 0 X - Vital Signs 1 5 Has the patient been seen at the hospital within the last three years: Yes Total Score: 110 Level Of Care: New/Established - Level 3 Electronic Signature(s) Signed: 08/16/2016 5:50:35 PM By: Montey Hora Entered By: Montey Hora on 08/16/2016 14:12:59 [name] ([id number]) -------------------------------------------------------------------------------- Encounter Discharge Information Details Patient Name: [name] Date of Service: 08/16/2016 1:30 PM Medical Record Number: [id number] Patient Account Number: [id number] Date of Birth/Sex: 07-02-1941 (75 y.o. Female) Treating RN: Montey Hora Primary Care Physician: Park Liter Other Clinician: Referring Physician: Park Liter Treating Physician/Extender: Loistine Chance in Treatment: 1 Encounter Discharge Information Items Discharge Pain Level:  0 Discharge Condition: Stable Ambulatory Status: Ambulatory Discharge Destination: Home Transportation: Private Auto Accompanied By: self Schedule Follow-up Appointment: Yes Medication Reconciliation completed and provided to Patient/Care No Lavin Petteway: Provided on Clinical Summary of Care: 08/16/2016 Form Type Recipient Paper Patient MM Electronic Signature(s) Signed: 08/16/2016 2:19:45 PM By: Ruthine Dose Entered By: Ruthine Dose on 08/16/2016 14:19:45 Trevathan, Kayren Webster ([id number]) -------------------------------------------------------------------------------- Lower Extremity Assessment Details Patient Name: [name] Date of Service: 08/16/2016 1:30 PM Medical Record Number: [id number] Patient Account Number: [id number] Date of Birth/Sex: 1941/05/06 (75 y.o. Female) Treating RN: Montey Hora Primary Care Physician: Park Liter Other Clinician: Referring Physician: Park Liter Treating Physician/Extender: Loistine Chance in Treatment: 1 Edema Assessment Assessed: [Left: No] [Right: No] Edema: [Left: Ye] [Right: s] Calf Left: Right: Point of Measurement: 34 cm From Medial Instep cm cm Ankle Left: Right: Point of Measurement: 10 cm From Medial Instep cm cm Vascular Assessment Pulses: Posterior Tibial Dorsalis Pedis Palpable: [Left:Yes] Extremity colors, hair growth, and conditions: Extremity Color: [Left:Hyperpigmented] Hair Growth on Extremity: [Left:Yes] Temperature of Extremity: [Left:Warm] Capillary Refill: [Left:< 3 seconds] Electronic Signature(s) Signed: 08/16/2016 5:50:35 PM By: Montey Hora Entered By: Montey Hora on 08/16/2016 14:11:35 Wetherell, Kayren Webster ([id number]) -------------------------------------------------------------------------------- Multi Wound Chart Details Patient Name: [name] Date of Service: 08/16/2016 1:30 PM Medical Record Number: [id number] Patient Account Number: [id number] Date of Birth/Sex: February 25, 1941 (75  y.o. Female) Treating RN: Montey Hora Primary Care Physician: Park Liter Other Clinician: Referring Physician: Park Liter Treating Physician/Extender: Loistine Chance in Treatment: 1 Vital Signs Height(in): 67 Pulse(bpm): 58 Weight(lbs): 184 Blood Pressure 139/79 (mmHg): Body Mass Index(BMI): 29 Temperature(F): Respiratory Rate 18 (breaths/min): Photos: [N/A:N/A] Wound Location: Left Lower Leg - Lateral, Left Lower Leg - Lateral, N/A Proximal Distal Wounding Event: Gradually Appeared Gradually Appeared N/A Primary Etiology: Venous Leg Ulcer Venous Leg Ulcer N/A Comorbid History:  Arrhythmia, Hypertension Arrhythmia, Hypertension N/A Date Acquired: 06/03/2016 06/03/2016 N/A Weeks of Treatment: 1 1 N/A Wound Status: Open Open N/A Measurements L x W x D 0.5x0.2x0.1 1.2x1.2x0.1 N/A (cm) Area (cm) : 0.079 1.131 N/A Volume (cm) : 0.008 0.113 N/A % Reduction in Area: 87.10% 20.00% N/A % Reduction in Volume: 86.90% 19.90% N/A Classification: Full Thickness Without Full Thickness Without N/A Exposed Support Exposed Support Structures Structures Exudate Amount: Large Medium N/A Exudate Type: Serous Serous N/A Exudate Color: amber amber N/A Wound Margin: Flat and Intact Flat and Intact N/A Granulation Amount: Large (67-100%) Large (67-100%) N/A Granulation Quality: Red Pink N/A Florance, Karine A. ([id number]) Necrotic Amount: Small (1-33%) Small (1-33%) N/A Necrotic Tissue: Eschar, Adherent Slough Eschar, Adherent Slough N/A Exposed Structures: Fascia: No Fascia: No N/A Fat: No Fat: No Tendon: No Tendon: No Muscle: No Muscle: No Joint: No Joint: No Bone: No Bone: No Limited to Skin Limited to Skin Breakdown Breakdown Epithelialization: None None N/A Periwound Skin Texture: Edema: No Edema: No N/A Excoriation: No Excoriation: No Induration: No Induration: No Callus: No Callus: No Crepitus: No Crepitus: No Fluctuance: No Fluctuance:  No Friable: No Friable: No Rash: No Rash: No Scarring: No Scarring: No Periwound Skin Moist: Yes Moist: Yes N/A Moisture: Maceration: No Maceration: No Dry/Scaly: No Dry/Scaly: No Periwound Skin Color: Erythema: Yes Atrophie Blanche: No N/A Atrophie Blanche: No Cyanosis: No Cyanosis: No Ecchymosis: No Ecchymosis: No Erythema: No Hemosiderin Staining: No Hemosiderin Staining: No Mottled: No Mottled: No Pallor: No Pallor: No Rubor: No Rubor: No Erythema Location: Circumferential N/A N/A Tenderness on Yes Yes N/A Palpation: Wound Preparation: Ulcer Cleansing: Ulcer Cleansing: N/A Rinsed/Irrigated with Rinsed/Irrigated with Saline Saline Topical Anesthetic Topical Anesthetic Applied: Other: lidocaine Applied: Other: lidocaine 4% 4% Treatment Notes Electronic Signature(s) Signed: 08/16/2016 5:50:35 PM By: Montey Hora Entered By: Montey Hora on 08/16/2016 14:11:53 Kaczorowski, Kayren Webster ([id number]) -------------------------------------------------------------------------------- Multi-Disciplinary Care Plan Details Patient Name: [name] Date of Service: 08/16/2016 1:30 PM Medical Record Number: [id number] Patient Account Number: [id number] Date of Birth/Sex: 1941/04/13 (75 y.o. Female) Treating RN: Montey Hora Primary Care Physician: Park Liter Other Clinician: Referring Physician: Park Liter Treating Physician/Extender: Loistine Chance in Treatment: 1 Active Inactive Abuse / Safety / Falls / Self Care Management Nursing Diagnoses: Impaired physical mobility Potential for falls Goals: Patient will remain injury free Date Initiated: 08/09/2016 Goal Status: Active Interventions: Assess fall risk on admission and as needed Notes: Orientation to the Wound Care Program Nursing Diagnoses: Knowledge deficit related to the wound healing center program Goals: Patient/caregiver will verbalize understanding of the [place]  Program Date Initiated: 08/09/2016 Goal Status: Active Interventions: Provide education on orientation to the wound center Notes: Wound/Skin Impairment Nursing Diagnoses: Impaired tissue integrity Goals: Patient/caregiver will verbalize understanding of skin care regimen Gong, Zykia A. ([id number]) Date Initiated: 08/09/2016 Goal Status: Active Ulcer/skin breakdown will have a volume reduction of 30% by week 4 Date Initiated: 08/09/2016 Goal Status: Active Ulcer/skin breakdown will have a volume reduction of 50% by week 8 Date Initiated: 08/09/2016 Goal Status: Active Ulcer/skin breakdown will have a volume reduction of 80% by week 12 Date Initiated: 08/09/2016 Goal Status: Active Ulcer/skin breakdown will heal within 14 weeks Date Initiated: 08/09/2016 Goal Status: Active Interventions: Assess patient/caregiver ability to obtain necessary supplies Assess patient/caregiver ability to perform ulcer/skin care regimen upon admission and as needed Assess ulceration(s) every visit Notes: Electronic Signature(s) Signed: 08/16/2016 5:50:35 PM By: Montey Hora Entered By: Montey Hora on 08/16/2016 14:11:44 Minion, Robi A. ([id number]) --------------------------------------------------------------------------------  Pain Assessment Details Patient Name: [name], [name]. Date of Service: 08/16/2016 1:30 PM Medical Record Number: [id number] Patient Account Number: [id number] Date of Birth/Sex: December 17, 1940 (75 y.o. Female) Treating RN: Montey Hora Primary Care Physician: Park Liter Other Clinician: Referring Physician: Park Liter Treating Physician/Extender: Loistine Chance in Treatment: 1 Active Problems Location of Pain Severity and Description of Pain Patient Has Paino No Site Locations Pain Management and Medication Current Pain Management: Notes Topical or injectable lidocaine is offered to patient for acute pain when surgical debridement is performed.  If needed, Patient is instructed to use over the counter pain medication for the following 24-48 hours after debridement. Wound care MDs do not prescribed pain medications. Patient has chronic pain or uncontrolled pain. Patient has been instructed to make an appointment with their Primary Care Physician for pain management. Electronic Signature(s) Signed: 08/16/2016 5:50:35 PM By: Montey Hora Entered By: Montey Hora on 08/16/2016 13:54:35 Losh, Kayren Webster ([id number]) -------------------------------------------------------------------------------- Patient/Caregiver Education Details Patient Name: [name] Date of Service: 08/16/2016 1:30 PM Medical Record Number: [id number] Patient Account Number: [id number] Date of Birth/Gender: December 25, 1940 (75 y.o. Female) Treating RN: Montey Hora Primary Care Physician: Park Liter Other Clinician: Referring Physician: Park Liter Treating Physician/Extender: Loistine Chance in Treatment: 1 Education Assessment Education Provided To: Patient Education Topics Provided Wound/Skin Impairment: Handouts: Other: wound care to continue as ordered Methods: Demonstration, Explain/Verbal Responses: State content correctly Electronic Signature(s) Signed: 08/16/2016 5:50:35 PM By: Montey Hora Entered By: Montey Hora on 08/16/2016 14:19:19 Mendez, Kayren Webster ([id number]) -------------------------------------------------------------------------------- Wound Assessment Details Patient Name: [name] Date of Service: 08/16/2016 1:30 PM Medical Record Number: [id number] Patient Account Number: [id number] Date of Birth/Sex: 06-29-1941 (75 y.o. Female) Treating RN: Montey Hora Primary Care Physician: Park Liter Other Clinician: Referring Physician: Park Liter Treating Physician/Extender: Loistine Chance in Treatment: 1 Wound Status Wound Number: 1 Primary Etiology: Venous Leg Ulcer Wound Location: Left Lower  Leg - Lateral, Wound Status: Open Proximal Comorbid History: Arrhythmia, Hypertension Wounding Event: Gradually Appeared Date Acquired: 06/03/2016 Weeks Of Treatment: 1 Clustered Wound: No Photos Wound Measurements Length: (cm) 0.5 Width: (cm) 0.2 Depth: (cm) 0.1 Area: (cm) 0.079 Volume: (cm) 0.008 % Reduction in Area: 87.1% % Reduction in Volume: 86.9% Epithelialization: None Tunneling: No Undermining: No Wound Description Full Thickness Without Exposed Foul Odor Aft Classification: Support Structures Wound Margin: Flat and Intact Exudate Large Amount: Exudate Type: Serous Exudate Color: amber er Cleansing: No Wound Bed Granulation Amount: Large (67-100%) Exposed Structure Granulation Quality: Red Fascia Exposed: No Necrotic Amount: Small (1-33%) Fat Layer Exposed: No Cowens, Desaray A. ([id number]) Necrotic Quality: Eschar, Adherent Slough Tendon Exposed: No Muscle Exposed: No Joint Exposed: No Bone Exposed: No Limited to Skin Breakdown Periwound Skin Texture Texture Color No Abnormalities Noted: No No Abnormalities Noted: No Callus: No Atrophie Blanche: No Crepitus: No Cyanosis: No Excoriation: No Ecchymosis: No Fluctuance: No Erythema: Yes Friable: No Erythema Location: Circumferential Induration: No Hemosiderin Staining: No Localized Edema: No Mottled: No Rash: No Pallor: No Scarring: No Rubor: No Moisture Temperature / Pain No Abnormalities Noted: No Tenderness on Palpation: Yes Dry / Scaly: No Maceration: No Moist: Yes Wound Preparation Ulcer Cleansing: Rinsed/Irrigated with Saline Topical Anesthetic Applied: Other: lidocaine 4%, Treatment Notes Wound #1 (Left, Proximal, Lateral Lower Leg) 1. Cleansed with: Clean wound with Normal Saline 2. Anesthetic Topical Lidocaine 4% cream to wound bed prior to debridement 4. Dressing Applied: Medihoney Gel 5. Secondary Oto  Signature(s) Signed: 08/16/2016 5:50:35 PM  By: Montey Hora Entered By: Montey Hora on 08/16/2016 14:10:40 Philbert, Kayren Webster ([id number]) -------------------------------------------------------------------------------- Wound Assessment Details Patient Name: [name] Date of Service: 08/16/2016 1:30 PM Medical Record Number: [id number] Patient Account Number: [id number] Date of Birth/Sex: 1941/09/06 (75 y.o. Female) Treating RN: Montey Hora Primary Care Physician: Park Liter Other Clinician: Referring Physician: Park Liter Treating Physician/Extender: Loistine Chance in Treatment: 1 Wound Status Wound Number: 2 Primary Etiology: Venous Leg Ulcer Wound Location: Left Lower Leg - Lateral, Distal Wound Status: Open Wounding Event: Gradually Appeared Comorbid History: Arrhythmia, Hypertension Date Acquired: 06/03/2016 Weeks Of Treatment: 1 Clustered Wound: No Photos Wound Measurements Length: (cm) 1.2 Width: (cm) 1.2 Depth: (cm) 0.1 Area: (cm) 1.131 Volume: (cm) 0.113 % Reduction in Area: 20% % Reduction in Volume: 19.9% Epithelialization: None Tunneling: No Undermining: No Wound Description Full Thickness Without Exposed Classification: Support Structures Wound Margin: Flat and Intact Exudate Medium Amount: Exudate Type: Serous Exudate Color: amber Foul Odor After Cleansing: No Wound Bed Granulation Amount: Large (67-100%) Exposed Structure Granulation Quality: Pink Fascia Exposed: No Necrotic Amount: Small (1-33%) Fat Layer Exposed: No Delrossi, Zakiya A. ([id number]) Necrotic Quality: Eschar, Adherent Slough Tendon Exposed: No Muscle Exposed: No Joint Exposed: No Bone Exposed: No Limited to Skin Breakdown Periwound Skin Texture Texture Color No Abnormalities Noted: No No Abnormalities Noted: No Callus: No Atrophie Blanche: No Crepitus: No Cyanosis: No Excoriation: No Ecchymosis: No Fluctuance: No Erythema: No Friable:  No Hemosiderin Staining: No Induration: No Mottled: No Localized Edema: No Pallor: No Rash: No Rubor: No Scarring: No Temperature / Pain Moisture Tenderness on Palpation: Yes No Abnormalities Noted: No Dry / Scaly: No Maceration: No Moist: Yes Wound Preparation Ulcer Cleansing: Rinsed/Irrigated with Saline Topical Anesthetic Applied: Other: lidocaine 4%, Treatment Notes Wound #2 (Left, Distal, Lateral Lower Leg) 1. Cleansed with: Clean wound with Normal Saline 2. Anesthetic Topical Lidocaine 4% cream to wound bed prior to debridement 4. Dressing Applied: Medihoney Gel 5. Secondary Dressing Applied Dry Barry Signature(s) Signed: 08/16/2016 5:50:35 PM By: Montey Hora Entered By: Montey Hora on 08/16/2016 14:11:03 Brink, Kayren Webster ([id number]) -------------------------------------------------------------------------------- Vitals Details Patient Name: [name] Date of Service: 08/16/2016 1:30 PM Medical Record Number: [id number] Patient Account Number: [id number] Date of Birth/Sex: Mar 27, 1941 (75 y.o. Female) Treating RN: Montey Hora Primary Care Physician: Park Liter Other Clinician: Referring Physician: Park Liter Treating Physician/Extender: Loistine Chance in Treatment: 1 Vital Signs Time Taken: 13:57 Pulse (bpm): 58 Height (in): 67 Respiratory Rate (breaths/min): 18 Weight (lbs): 184 Blood Pressure (mmHg): 139/79 Body Mass Index (BMI): 28.8 Reference Range: 80 - 120 mg / dl Electronic Signature(s) Signed: 08/16/2016 5:50:35 PM By: Montey Hora Entered By: Montey Hora on 08/16/2016 13:57:18

## 2016-08-17 NOTE — Progress Notes (Signed)
Lisa, Webster ([id number]) Visit Report for 08/16/2016 Chief Complaint Document Details Patient Name: [name], [name]. Date of Service: 08/16/2016 1:30 PM Medical Record Number: [id number] Patient Account Number: [id number] Date of Birth/Sex: Aug 14, 1941 (75 y.o. Female) Treating RN: Montey Hora Primary Care Physician: Park Liter Other Clinician: Referring Physician: Park Liter Treating Physician/Extender: Loistine Chance in Treatment: 1 Information Obtained from: Patient Chief Complaint Referral for left lower leg ulcers Electronic Signature(s) Signed: 08/16/2016 5:31:30 PM By: Londell Moh FNP Entered By: Londell Moh on 08/16/2016 14:17:34 Leider, [name] ([id number]) -------------------------------------------------------------------------------- HPI Details Patient Name: [name] Date of Service: 08/16/2016 1:30 PM Medical Record Number: [id number] Patient Account Number: [id number] Date of Birth/Sex: 1940/12/10 (75 y.o. Female) Treating RN: Montey Hora Primary Care Physician: Park Liter Other Clinician: Referring Physician: Park Liter Treating Physician/Extender: Loistine Chance in Treatment: 1 History of Present Illness Location: Patient presents with wounds to left lower leg. Quality: stinging Severity: mild Duration: > 2 months Timing: Pain in wound is Intermittent (comes and goes Context: The wound appeared gradually over time Modifying Factors: warm compresses, neosporin, PAD, BLE swelling Associated Signs and Symptoms: redness, drainage, pain HPI Description: This is a pleasant 75 y/o elderly female who was referred today for a chronic non healing ulcers to the left lower leg. Gradually appeared approximately 2 months ago. Self treating with warm compresses and neosporin. She was seen by her PCP on 08/06/16 that prompted referral to our clinic for evaluation and on going management. She denies fever, chills, body aches or  malaise. Hx of chronic BLE edema. She denies hx of Diabetes. Positive for HTN and atrial fibrillation. Remote hx of smoking but quit in January of 1980. She denies claudication. She admits to slow healing of wounds. She also notes a resolving macular rash on the RLE. 08/16/16: pt returns today for f/u regarding left lower leg wounds. she has noticed decrease in pain and redness. she reports noted improvement. she denies systemic s/s of infection. I reviewed xray findings dated 08/09/16 that showed no bony changes. soft tissue induration noted secondary to soft tissue injury. no evidence for abscess. I also reviewed wound culture findings that showed no organisms or WBCs. nl skin flora. Electronic Signature(s) Signed: 08/16/2016 5:31:30 PM By: Londell Moh FNP Entered By: Londell Moh on 08/16/2016 14:20:18 Pelissier, [name] ([id number]) -------------------------------------------------------------------------------- Physical Exam Details Patient Name: [name] Date of Service: 08/16/2016 1:30 PM Medical Record Number: [id number] Patient Account Number: [id number] Date of Birth/Sex: 1941-02-25 (75 y.o. Female) Treating RN: Montey Hora Primary Care Physician: Park Liter Other Clinician: Referring Physician: Park Liter Treating Physician/Extender: Loistine Chance in Treatment: 1 Constitutional Patient's appearance is neat and clean. Appears in no acute distress.. Ears, Nose, Mouth, and Throat Patient can hear normal speaking tones without difficulty.Marland Kitchen Respiratory Respiratory effort is easy and symmetric bilaterally. Rate is normal at rest and on room air.. Cardiovascular Extremities are free of varicosities, clubbing or edema.Marland Kitchen Psychiatric Judgement and insight intact.. Alert and oriented times 3.. Short and long term memory intact.. No evidence of depression, anxiety, or agitation. Calm, cooperative, and communicative. Appropriate interactions  and affect.. Electronic Signature(s) Signed: 08/16/2016 5:31:30 PM By: Londell Moh FNP Entered By: Londell Moh on 08/16/2016 14:20:56 Jasinski, [name] ([id number]) -------------------------------------------------------------------------------- Physician Orders Details Patient Name: [name] Date of Service: 08/16/2016 1:30 PM Medical Record Number: [id number] Patient Account Number: [id number] Date of Birth/Sex: 1941-09-05 (75 y.o. Female) Treating RN: Montey Hora Primary Care Physician: Park Liter Other Clinician: Referring Physician:  Wynetta Emery, [place] Treating Physician/Extender: Loistine Chance in Treatment: 1 Verbal / Phone Orders: Yes Clinician: Montey Hora Read Back and Verified: Yes Diagnosis Coding Wound Cleansing Wound #1 Left,Proximal,Lateral Lower Leg o Clean wound with Normal Saline. o May Shower, gently pat wound dry prior to applying new dressing. Wound #2 Left,Distal,Lateral Lower Leg o Clean wound with Normal Saline. o May Shower, gently pat wound dry prior to applying new dressing. Anesthetic Wound #1 Left,Proximal,Lateral Lower Leg o Topical Lidocaine 4% cream applied to wound bed prior to debridement Wound #2 Left,Distal,Lateral Lower Leg o Topical Lidocaine 4% cream applied to wound bed prior to debridement Primary Wound Dressing Wound #1 Left,Proximal,Lateral Lower Leg o Medihoney gel Wound #2 Left,Distal,Lateral Lower Leg o Medihoney gel Secondary Dressing Wound #1 Left,Proximal,Lateral Lower Leg o Dry Gauze o Boardered Foam Dressing - or telfa island dressing Wound #2 Left,Distal,Lateral Lower Leg o Dry Gauze o Boardered Foam Dressing - or telfa island dressing Dressing Change Frequency Wound #1 Left,Proximal,Lateral Lower Leg o Change dressing every day. Phillips, Kaelah A. ([id number]) Wound #2 Left,Distal,Lateral Lower Leg o Change dressing every day. Follow-up Appointments Wound #1  Left,Proximal,Lateral Lower Leg o Return Appointment in 1 week. Wound #2 Left,Distal,Lateral Lower Leg o Return Appointment in 1 week. Edema Control Wound #1 Left,Proximal,Lateral Lower Leg o Elevate legs to the level of the heart and pump ankles as often as possible Wound #2 Left,Distal,Lateral Lower Leg o Elevate legs to the level of the heart and pump ankles as often as possible Electronic Signature(s) Signed: 08/16/2016 5:31:30 PM By: Londell Moh FNP Signed: 08/16/2016 5:50:35 PM By: Montey Hora Entered By: Montey Hora on 08/16/2016 14:12:24 Dawkins, [name] ([id number]) -------------------------------------------------------------------------------- Problem List Details Patient Name: [name] Date of Service: 08/16/2016 1:30 PM Medical Record Number: [id number] Patient Account Number: [id number] Date of Birth/Sex: 04/16/1941 (75 y.o. Female) Treating RN: Montey Hora Primary Care Physician: Park Liter Other Clinician: Referring Physician: Park Liter Treating Physician/Extender: Loistine Chance in Treatment: 1 Active Problems ICD-10 Encounter Code Description Active Date Diagnosis L97.829 Non-pressure chronic ulcer of other part of left lower leg 08/09/2016 Yes with unspecified severity I73.9 Peripheral vascular disease, unspecified 08/09/2016 Yes I87.312 Chronic venous hypertension (idiopathic) with ulcer of left 08/09/2016 Yes lower extremity Inactive Problems Resolved Problems Electronic Signature(s) Signed: 08/16/2016 5:31:30 PM By: Londell Moh FNP Entered By: Londell Moh on 08/16/2016 14:17:25 Crabbe, [name] ([id number]) -------------------------------------------------------------------------------- Progress Note Details Patient Name: [name] Date of Service: 08/16/2016 1:30 PM Medical Record Number: [id number] Patient Account Number: [id number] Date of Birth/Sex: 1941-10-25 (75 y.o. Female) Treating RN: Montey Hora Primary Care Physician: Park Liter Other Clinician: Referring Physician: Park Liter Treating Physician/Extender: Loistine Chance in Treatment: 1 Subjective Chief Complaint Information obtained from Patient Referral for left lower leg ulcers History of Present Illness (HPI) The following HPI elements were documented for the patient's wound: Location: Patient presents with wounds to left lower leg. Quality: stinging Severity: mild Duration: > 2 months Timing: Pain in wound is Intermittent (comes and goes Context: The wound appeared gradually over time Modifying Factors: warm compresses, neosporin, PAD, BLE swelling Associated Signs and Symptoms: redness, drainage, pain This is a pleasant 75 y/o elderly female who was referred today for a chronic non healing ulcers to the left lower leg. Gradually appeared approximately 2 months ago. Self treating with warm compresses and neosporin. She was seen by her PCP on 08/06/16 that prompted referral to our clinic for evaluation and on going management. She denies fever, chills,  body aches or malaise. Hx of chronic BLE edema. She denies hx of Diabetes. Positive for HTN and atrial fibrillation. Remote hx of smoking but quit in January of 1980. She denies claudication. She admits to slow healing of wounds. She also notes a resolving macular rash on the RLE. 08/16/16: pt returns today for f/u regarding left lower leg wounds. she has noticed decrease in pain and redness. she reports noted improvement. she denies systemic s/s of infection. I reviewed xray findings dated 08/09/16 that showed no bony changes. soft tissue induration noted secondary to soft tissue injury. no evidence for abscess. I also reviewed wound culture findings that showed no organisms or WBCs. nl skin flora. Objective Constitutional Patient's appearance is neat and clean. Appears in no acute distress.Marland Kitchen Schertzer, Jaylena A. ([id number]) Vitals Time Taken: 1:57 PM,  Height: 67 in, Weight: 184 lbs, BMI: 28.8, Pulse: 58 bpm, Respiratory Rate: 18 breaths/min, Blood Pressure: 139/79 mmHg. Ears, Nose, Mouth, and Throat Patient can hear normal speaking tones without difficulty.Marland Kitchen Respiratory Respiratory effort is easy and symmetric bilaterally. Rate is normal at rest and on room air.. Cardiovascular Extremities are free of varicosities, clubbing or edema.Marland Kitchen Psychiatric Judgement and insight intact.. Alert and oriented times 3.. Short and long term memory intact.. No evidence of depression, anxiety, or agitation. Calm, cooperative, and communicative. Appropriate interactions and affect.. Integumentary (Hair, Skin) Wound #1 status is Open. Original cause of wound was Gradually Appeared. The wound is located on the Left,Proximal,Lateral Lower Leg. The wound measures 0.5cm length x 0.2cm width x 0.1cm depth; 0.079cm^2 area and 0.008cm^3 volume. The wound is limited to skin breakdown. There is no tunneling or undermining noted. There is a large amount of serous drainage noted. The wound margin is flat and intact. There is large (67-100%) red granulation within the wound bed. There is a small (1-33%) amount of necrotic tissue within the wound bed including Eschar and Adherent Slough. The periwound skin appearance exhibited: Moist, Erythema. The periwound skin appearance did not exhibit: Callus, Crepitus, Excoriation, Fluctuance, Friable, Induration, Localized Edema, Rash, Scarring, Dry/Scaly, Maceration, Atrophie Blanche, Cyanosis, Ecchymosis, Hemosiderin Staining, Mottled, Pallor, Rubor. The surrounding wound skin color is noted with erythema which is circumferential. The periwound has tenderness on palpation. Wound #2 status is Open. Original cause of wound was Gradually Appeared. The wound is located on the Left,Distal,Lateral Lower Leg. The wound measures 1.2cm length x 1.2cm width x 0.1cm depth; 1.131cm^2 area and 0.113cm^3 volume. The wound is limited to skin  breakdown. There is no tunneling or undermining noted. There is a medium amount of serous drainage noted. The wound margin is flat and intact. There is large (67-100%) pink granulation within the wound bed. There is a small (1-33%) amount of necrotic tissue within the wound bed including Eschar and Adherent Slough. The periwound skin appearance exhibited: Moist. The periwound skin appearance did not exhibit: Callus, Crepitus, Excoriation, Fluctuance, Friable, Induration, Localized Edema, Rash, Scarring, Dry/Scaly, Maceration, Atrophie Blanche, Cyanosis, Ecchymosis, Hemosiderin Staining, Mottled, Pallor, Rubor, Erythema. The periwound has tenderness on palpation. left lower leg wounds with noted improvement. the smaller wound is almost healed. the wounds have responded well to the honey dressings. Assessment Bourbon, Ashlay A. ([id number]) Active Problems ICD-10 L97.829 - Non-pressure chronic ulcer of other part of left lower leg with unspecified severity I73.9 - Peripheral vascular disease, unspecified I87.312 - Chronic venous hypertension (idiopathic) with ulcer of left lower extremity Plan Wound Cleansing: Wound #1 Left,Proximal,Lateral Lower Leg: Clean wound with Normal Saline. May Shower, gently pat wound dry  prior to applying new dressing. Wound #2 Left,Distal,Lateral Lower Leg: Clean wound with Normal Saline. May Shower, gently pat wound dry prior to applying new dressing. Anesthetic: Wound #1 Left,Proximal,Lateral Lower Leg: Topical Lidocaine 4% cream applied to wound bed prior to debridement Wound #2 Left,Distal,Lateral Lower Leg: Topical Lidocaine 4% cream applied to wound bed prior to debridement Primary Wound Dressing: Wound #1 Left,Proximal,Lateral Lower Leg: Medihoney gel Wound #2 Left,Distal,Lateral Lower Leg: Medihoney gel Secondary Dressing: Wound #1 Left,Proximal,Lateral Lower Leg: Dry Gauze Boardered Foam Dressing - or telfa island dressing Wound #2  Left,Distal,Lateral Lower Leg: Dry Gauze Boardered Foam Dressing - or telfa island dressing Dressing Change Frequency: Wound #1 Left,Proximal,Lateral Lower Leg: Change dressing every day. Wound #2 Left,Distal,Lateral Lower Leg: Change dressing every day. Follow-up Appointments: Wound #1 Left,Proximal,Lateral Lower Leg: Return Appointment in 1 week. Wound #2 Left,Distal,Lateral Lower Leg: Return Appointment in 1 week. Edema Control: Smaltz, Anaia A. ([id number]) Wound #1 Left,Proximal,Lateral Lower Leg: Elevate legs to the level of the heart and pump ankles as often as possible Wound #2 Left,Distal,Lateral Lower Leg: Elevate legs to the level of the heart and pump ankles as often as possible Follow-Up Appointments: A follow-up appointment should be scheduled. A Patient Clinical Summary of Care was provided to MM 1. discussed clinical findings and implications with pt. all questions were answered. 2. reviewed and discussed xray findings. 3. reviewed and discussed wound culture findings. Electronic Signature(s) Signed: 08/16/2016 5:31:30 PM By: Londell Moh FNP Entered By: Londell Moh on 08/16/2016 14:22:27 Gittings, [name] ([id number]) -------------------------------------------------------------------------------- SuperBill Details Patient Name: [name] Date of Service: 08/16/2016 Medical Record Number: [id number] Patient Account Number: [id number] Date of Birth/Sex: [birth year] (75 y.o. Female) Treating RN: Montey Hora Primary Care Physician: Park Liter Other Clinician: Referring Physician: Park Liter Treating Physician/Extender: Loistine Chance in Treatment: 1 Diagnosis Coding ICD-10 Codes Code Description [phone] Non-pressure chronic ulcer of other part of left lower leg with unspecified severity I73.9 Peripheral vascular disease, unspecified I87.312 Chronic venous hypertension (idiopathic) with ulcer of left lower extremity Facility  Procedures CPT4 Code: [id number] Description: 99213 - WOUND CARE VISIT-LEV 3 EST PT Modifier: Quantity: 1 Physician Procedures CPT4: Description Modifier Quantity Code [id number] 99213 - WC PHYS LEVEL 3 - EST PT 1 ICD-10 Description Diagnosis L97.829 Non-pressure chronic ulcer of other part of left lower leg with unspecified severity I73.9 Peripheral vascular disease, unspecified  I87.312 Chronic venous hypertension (idiopathic) with ulcer of left lower extremity Electronic Signature(s) Signed: 08/16/2016 5:31:30 PM By: Londell Moh FNP Entered By: Londell Moh on 08/16/2016 14:22:42

## 2016-08-23 ENCOUNTER — Encounter: Payer: Medicare PPO | Admitting: Surgery

## 2016-08-23 DIAGNOSIS — I87312 Chronic venous hypertension (idiopathic) with ulcer of left lower extremity: Secondary | ICD-10-CM | POA: Diagnosis not present

## 2016-08-24 NOTE — Progress Notes (Addendum)
Lisa, Webster ([id number]) Visit Report for 08/23/2016 Chief Complaint Document Details Patient Name: [name], [name]. Date of Service: 08/23/2016 2:15 PM Medical Record Number: [id number] Patient Account Number: [id number] Date of Birth/Sex: [birth year] (75 y.o. Female) Treating RN: Montey Hora Primary Care Physician: Park Liter Other Clinician: Referring Physician: Park Liter Treating Physician/Extender: Frann Rider in Treatment: 2 Information Obtained from: Patient Chief Complaint Referral for left lower leg ulcers Electronic Signature(s) Signed: 08/23/2016 3:07:08 PM By: Christin Fudge MD, FACS Entered By: Christin Fudge on 08/23/2016 15:07:08 Spoerl, [name] ([id number]) -------------------------------------------------------------------------------- Debridement Details Patient Name: [name] Date of Service: 08/23/2016 2:15 PM Medical Record Number: [id number] Patient Account Number: [id number] Date of Birth/Sex: 07-Feb-1941 (75 y.o. Female) Treating RN: Montey Hora Primary Care Physician: Park Liter Other Clinician: Referring Physician: Park Liter Treating Physician/Extender: Frann Rider in Treatment: 2 Debridement Performed for Wound #2 Left,Distal,Lateral Lower Leg Assessment: Performed By: Physician Christin Fudge, MD Debridement: Debridement Pre-procedure Yes - 14:50 Verification/Time Out Taken: Start Time: 14:50 Pain Control: Lidocaine 4% Topical Solution Level: Skin/Subcutaneous Tissue Total Area Debrided (L x 1.1 (cm) x 1.2 (cm) = 1.32 (cm) W): Tissue and other Viable, Non-Viable, Eschar, Fibrin/Slough, Subcutaneous material debrided: Instrument: Curette Bleeding: Minimum Hemostasis Achieved: Pressure End Time: 14:52 Procedural Pain: 0 Post Procedural Pain: 0 Response to Treatment: Procedure was tolerated well Post Debridement Measurements of Total Wound Length: (cm) 1.1 Width: (cm) 1.2 Depth: (cm) 0.3 Volume:  (cm) 0.311 Character of Wound/Ulcer Post Improved Debridement: Severity of Tissue Post Debridement: Fat layer exposed Post Procedure Diagnosis Same as Pre-procedure Electronic Signature(s) Signed: 08/23/2016 3:06:59 PM By: Christin Fudge MD, FACS Signed: 08/23/2016 4:59:11 PM By: Montey Hora Entered By: Christin Fudge on 08/23/2016 15:06:59 Aramburo, [name] ([id number]) Neuner, [name] ([id number]) -------------------------------------------------------------------------------- HPI Details Patient Name: [name] Date of Service: 08/23/2016 2:15 PM Medical Record Number: [id number] Patient Account Number: [id number] Date of Birth/Sex: 07-06-1941 (75 y.o. Female) Treating RN: Montey Hora Primary Care Physician: Park Liter Other Clinician: Referring Physician: Park Liter Treating Physician/Extender: Frann Rider in Treatment: 2 History of Present Illness Location: Patient presents with wounds to left lower leg. Quality: stinging Severity: mild Duration: > 2 months Timing: Pain in wound is Intermittent (comes and goes Context: The wound appeared gradually over time Modifying Factors: warm compresses, neosporin, PAD, BLE swelling Associated Signs and Symptoms: redness, drainage, pain HPI Description: This is a pleasant 75 y/o elderly female who was referred today for a chronic non healing ulcers to the left lower leg. Gradually appeared approximately 2 months ago. Self treating with warm compresses and neosporin. She was seen by her PCP on 08/06/16 that prompted referral to our clinic for evaluation and on going management. She denies fever, chills, body aches or malaise. Hx of chronic BLE edema. She denies hx of Diabetes. Positive for HTN and atrial fibrillation. Remote hx of smoking but quit in January of 1980. She denies claudication. She admits to slow healing of wounds. She also notes a resolving macular rash on the RLE. 08/16/16: pt returns today for f/u  regarding left lower leg wounds. she has noticed decrease in pain and redness. she reports noted improvement. she denies systemic s/s of infection. I reviewed xray findings dated 08/09/16 that showed no bony changes. soft tissue induration noted secondary to soft tissue injury. no evidence for abscess. I also reviewed wound culture findings that showed no organisms or WBCs. nl skin flora. 08/23/2016 --had a culture done which showed normal skin flora and no  anaerobes isolated. X-ray of the left tibia and fibula showed no erosive changes or bony destruction. Electronic Signature(s) Signed: 08/23/2016 3:07:35 PM By: Christin Fudge MD, FACS Previous Signature: 08/23/2016 2:31:32 PM Version By: Christin Fudge MD, FACS Entered By: Christin Fudge on 08/23/2016 15:07:35 Sather, [name] ([id number]) -------------------------------------------------------------------------------- Physical Exam Details Patient Name: [name] Date of Service: 08/23/2016 2:15 PM Medical Record Number: [id number] Patient Account Number: [id number] Date of Birth/Sex: Mar 17, 1941 (75 y.o. Female) Treating RN: Montey Hora Primary Care Physician: Park Liter Other Clinician: Referring Physician: Park Liter Treating Physician/Extender: Frann Rider in Treatment: 2 Constitutional . Pulse regular. Respirations normal and unlabored. Afebrile. . Eyes Nonicteric. Reactive to light. Ears, Nose, Mouth, and Throat Lips, teeth, and gums WNL.Marland Kitchen Moist mucosa without lesions. Neck supple and nontender. No palpable supraclavicular or cervical adenopathy. Normal sized without goiter. Respiratory WNL. No retractions.. Breath sounds WNL, No rubs, rales, rhonchi, or wheeze.. Cardiovascular Heart rhythm and rate regular, no murmur or gallop.. Pedal Pulses WNL. No clubbing, cyanosis or edema. Chest Breasts symmetical and no nipple discharge.. Breast tissue WNL, no masses, lumps, or tenderness.. Lymphatic No adneopathy.  No adenopathy. No adenopathy. Musculoskeletal Adexa without tenderness or enlargement.. Digits and nails w/o clubbing, cyanosis, infection, petechiae, ischemia, or inflammatory conditions.. Integumentary (Hair, Skin) No suspicious lesions. No crepitus or fluctuance. No peri-wound warmth or erythema. No masses.Marland Kitchen Psychiatric Judgement and insight Intact.. No evidence of depression, anxiety, or agitation.. Notes initially she has varicose veins of the left lower extremity and though this ulcers on the lateral part of the lower third of the leg this may well be a venous ulceration with a lot of necrotic debris in the subcutaneous tissue. Using a #3 curet to sharply remove this and bleeding controlled with pressure. Electronic Signature(s) Signed: 08/23/2016 3:08:11 PM By: Christin Fudge MD, FACS Entered By: Christin Fudge on 08/23/2016 15:08:11 Dreier, [name] ([id number]) -------------------------------------------------------------------------------- Physician Orders Details Patient Name: [name] Date of Service: 08/23/2016 2:15 PM Medical Record Number: [id number] Patient Account Number: [id number] Date of Birth/Sex: [birth year] (75 y.o. Female) Treating RN: Montey Hora Primary Care Physician: Park Liter Other Clinician: Referring Physician: Park Liter Treating Physician/Extender: Frann Rider in Treatment: 2 Verbal / Phone Orders: Yes Clinician: Montey Hora Read Back and Verified: Yes Diagnosis Coding Wound Cleansing Wound #1 Left,Proximal,Lateral Lower Leg o Clean wound with Normal Saline. o May Shower, gently pat wound dry prior to applying new dressing. Wound #2 Left,Distal,Lateral Lower Leg o Clean wound with Normal Saline. o May Shower, gently pat wound dry prior to applying new dressing. Anesthetic Wound #1 Left,Proximal,Lateral Lower Leg o Topical Lidocaine 4% cream applied to wound bed prior to debridement Wound #2 Left,Distal,Lateral  Lower Leg o Topical Lidocaine 4% cream applied to wound bed prior to debridement Primary Wound Dressing Wound #1 Left,Proximal,Lateral Lower Leg o Hydrafera Blue Wound #2 Left,Distal,Lateral Lower Leg o Hydrafera Blue Secondary Dressing Wound #1 Left,Proximal,Lateral Lower Leg o ABD pad Wound #2 Left,Distal,Lateral Lower Leg o ABD pad Dressing Change Frequency Wound #1 Left,Proximal,Lateral Lower Leg o Change dressing every week Wound #2 Left,Distal,Lateral Lower Leg o Change dressing every week Elvin, Amire A. ([id number]) Follow-up Appointments Wound #1 Left,Proximal,Lateral Lower Leg o Return Appointment in 1 week. Wound #2 Left,Distal,Lateral Lower Leg o Return Appointment in 1 week. Edema Control Wound #1 Left,Proximal,Lateral Lower Leg o 3 Layer Compression System - Left Lower Extremity o Elevate legs to the level of the heart and pump ankles as often as possible Wound #2 Left,Distal,Lateral Lower  Leg o 3 Layer Compression System - Left Lower Extremity o Elevate legs to the level of the heart and pump ankles as often as possible Electronic Signature(s) Signed: 08/23/2016 4:40:13 PM By: Christin Fudge MD, FACS Signed: 08/23/2016 4:59:11 PM By: Montey Hora Entered By: Montey Hora on 08/23/2016 14:54:24 Helbig, [name] ([id number]) -------------------------------------------------------------------------------- Problem List Details Patient Name: [name] Date of Service: 08/23/2016 2:15 PM Medical Record Number: [id number] Patient Account Number: [id number] Date of Birth/Sex: 1941-10-24 (75 y.o. Female) Treating RN: Montey Hora Primary Care Physician: Park Liter Other Clinician: Referring Physician: Park Liter Treating Physician/Extender: Frann Rider in Treatment: 2 Active Problems ICD-10 Encounter Code Description Active Date Diagnosis L97.829 Non-pressure chronic ulcer of other part of left lower leg 08/09/2016  Yes with unspecified severity I73.9 Peripheral vascular disease, unspecified 08/09/2016 Yes I87.312 Chronic venous hypertension (idiopathic) with ulcer of left 08/09/2016 Yes lower extremity Inactive Problems Resolved Problems Electronic Signature(s) Signed: 08/23/2016 3:06:51 PM By: Christin Fudge MD, FACS Entered By: Christin Fudge on 08/23/2016 15:06:50 Kempen, [name] ([id number]) -------------------------------------------------------------------------------- Progress Note Details Patient Name: [name] Date of Service: 08/23/2016 2:15 PM Medical Record Number: [id number] Patient Account Number: [id number] Date of Birth/Sex: May 11, 1941 (75 y.o. Female) Treating RN: Montey Hora Primary Care Physician: Park Liter Other Clinician: Referring Physician: Park Liter Treating Physician/Extender: Frann Rider in Treatment: 2 Subjective Chief Complaint Information obtained from Patient Referral for left lower leg ulcers History of Present Illness (HPI) The following HPI elements were documented for the patient's wound: Location: Patient presents with wounds to left lower leg. Quality: stinging Severity: mild Duration: > 2 months Timing: Pain in wound is Intermittent (comes and goes Context: The wound appeared gradually over time Modifying Factors: warm compresses, neosporin, PAD, BLE swelling Associated Signs and Symptoms: redness, drainage, pain This is a pleasant 75 y/o elderly female who was referred today for a chronic non healing ulcers to the left lower leg. Gradually appeared approximately 2 months ago. Self treating with warm compresses and neosporin. She was seen by her PCP on 08/06/16 that prompted referral to our clinic for evaluation and on going management. She denies fever, chills, body aches or malaise. Hx of chronic BLE edema. She denies hx of Diabetes. Positive for HTN and atrial fibrillation. Remote hx of smoking but quit in January of 1980. She  denies claudication. She admits to slow healing of wounds. She also notes a resolving macular rash on the RLE. 08/16/16: pt returns today for f/u regarding left lower leg wounds. she has noticed decrease in pain and redness. she reports noted improvement. she denies systemic s/s of infection. I reviewed xray findings dated 08/09/16 that showed no bony changes. soft tissue induration noted secondary to soft tissue injury. no evidence for abscess. I also reviewed wound culture findings that showed no organisms or WBCs. nl skin flora. 08/23/2016 --had a culture done which showed normal skin flora and no anaerobes isolated. X-ray of the left tibia and fibula showed no erosive changes or bony destruction. Objective Bilski, Samarrah A. ([id number]) Constitutional Pulse regular. Respirations normal and unlabored. Afebrile. Vitals Time Taken: 2:29 PM, Height: 67 in, Weight: 184 lbs, BMI: 28.8, Temperature: 98.3 F, Pulse: 63 bpm, Respiratory Rate: 18 breaths/min, Blood Pressure: 139/67 mmHg. Eyes Nonicteric. Reactive to light. Ears, Nose, Mouth, and Throat Lips, teeth, and gums WNL.Marland Kitchen Moist mucosa without lesions. Neck supple and nontender. No palpable supraclavicular or cervical adenopathy. Normal sized without goiter. Respiratory WNL. No retractions.. Breath sounds WNL, No rubs, rales, rhonchi,  or wheeze.. Cardiovascular Heart rhythm and rate regular, no murmur or gallop.. Pedal Pulses WNL. No clubbing, cyanosis or edema. Chest Breasts symmetical and no nipple discharge.. Breast tissue WNL, no masses, lumps, or tenderness.. Lymphatic No adneopathy. No adenopathy. No adenopathy. Musculoskeletal Adexa without tenderness or enlargement.. Digits and nails w/o clubbing, cyanosis, infection, petechiae, ischemia, or inflammatory conditions.Marland Kitchen Psychiatric Judgement and insight Intact.. No evidence of depression, anxiety, or agitation.. General Notes: initially she has varicose veins of the left lower  extremity and though this ulcers on the lateral part of the lower third of the leg this may well be a venous ulceration with a lot of necrotic debris in the subcutaneous tissue. Using a #3 curet to sharply remove this and bleeding controlled with pressure. Integumentary (Hair, Skin) No suspicious lesions. No crepitus or fluctuance. No peri-wound warmth or erythema. No masses.. Wound #1 status is Open. Original cause of wound was Gradually Appeared. The wound is located on the Left,Proximal,Lateral Lower Leg. The wound measures 0.5cm length x 0.3cm width x 0.1cm depth; 0.118cm^2 area and 0.012cm^3 volume. The wound is limited to skin breakdown. There is no tunneling or undermining noted. There is a large amount of serous drainage noted. The wound margin is flat and intact. There is large (67-100%) red granulation within the wound bed. There is a small (1-33%) amount of necrotic tissue within the wound bed including Eschar and Adherent Slough. The periwound skin appearance exhibited: Moist, Erythema. The periwound skin appearance did not exhibit: Callus, Crepitus, Excoriation, Tramell, Varshini A. ([id number]) Fluctuance, Friable, Induration, Localized Edema, Rash, Scarring, Dry/Scaly, Maceration, Atrophie Blanche, Cyanosis, Ecchymosis, Hemosiderin Staining, Mottled, Pallor, Rubor. The surrounding wound skin color is noted with erythema which is circumferential. The periwound has tenderness on palpation. Wound #2 status is Open. Original cause of wound was Gradually Appeared. The wound is located on the Left,Distal,Lateral Lower Leg. The wound measures 1.1cm length x 1.2cm width x 0.2cm depth; 1.037cm^2 area and 0.207cm^3 volume. The wound is limited to skin breakdown. There is no tunneling or undermining noted. There is a medium amount of serous drainage noted. The wound margin is flat and intact. There is large (67-100%) pink granulation within the wound bed. There is a small (1-33%) amount of necrotic  tissue within the wound bed including Eschar and Adherent Slough. The periwound skin appearance exhibited: Moist. The periwound skin appearance did not exhibit: Callus, Crepitus, Excoriation, Fluctuance, Friable, Induration, Localized Edema, Rash, Scarring, Dry/Scaly, Maceration, Atrophie Blanche, Cyanosis, Ecchymosis, Hemosiderin Staining, Mottled, Pallor, Rubor, Erythema. The periwound has tenderness on palpation. Assessment Active Problems ICD-10 L97.829 - Non-pressure chronic ulcer of other part of left lower leg with unspecified severity I73.9 - Peripheral vascular disease, unspecified I87.312 - Chronic venous hypertension (idiopathic) with ulcer of left lower extremity Procedures Wound #2 Wound #2 is a Venous Leg Ulcer located on the Left,Distal,Lateral Lower Leg . There was a Skin/Subcutaneous Tissue Debridement [id number]) debridement with total area of 1.32 sq cm performed by Christin Fudge, MD. with the following instrument(s): Curette to remove Viable and Non-Viable tissue/material including Fibrin/Slough, Eschar, and Subcutaneous after achieving pain control using Lidocaine 4% Topical Solution. A time out was conducted at 14:50, prior to the start of the procedure. A Minimum amount of bleeding was controlled with Pressure. The procedure was tolerated well with a pain level of 0 throughout and a pain level of 0 following the procedure. Post Debridement Measurements: 1.1cm length x 1.2cm width x 0.3cm depth; 0.311cm^3 volume. Character of Wound/Ulcer Post Debridement is improved. Severity  of Tissue Post Debridement is: Fat layer exposed. Post procedure Diagnosis Wound #2: Same as Pre-Procedure Licht, Kista A. ([id number]) Plan Wound Cleansing: Wound #1 Left,Proximal,Lateral Lower Leg: Clean wound with Normal Saline. May Shower, gently pat wound dry prior to applying new dressing. Wound #2 Left,Distal,Lateral Lower Leg: Clean wound with Normal Saline. May Shower, gently pat  wound dry prior to applying new dressing. Anesthetic: Wound #1 Left,Proximal,Lateral Lower Leg: Topical Lidocaine 4% cream applied to wound bed prior to debridement Wound #2 Left,Distal,Lateral Lower Leg: Topical Lidocaine 4% cream applied to wound bed prior to debridement Primary Wound Dressing: Wound #1 Left,Proximal,Lateral Lower Leg: Hydrafera Blue Wound #2 Left,Distal,Lateral Lower Leg: Hydrafera Blue Secondary Dressing: Wound #1 Left,Proximal,Lateral Lower Leg: ABD pad Wound #2 Left,Distal,Lateral Lower Leg: ABD pad Dressing Change Frequency: Wound #1 Left,Proximal,Lateral Lower Leg: Change dressing every week Wound #2 Left,Distal,Lateral Lower Leg: Change dressing every week Follow-up Appointments: Wound #1 Left,Proximal,Lateral Lower Leg: Return Appointment in 1 week. Wound #2 Left,Distal,Lateral Lower Leg: Return Appointment in 1 week. Edema Control: Wound #1 Left,Proximal,Lateral Lower Leg: 3 Layer Compression System - Left Lower Extremity Elevate legs to the level of the heart and pump ankles as often as possible Wound #2 Left,Distal,Lateral Lower Leg: 3 Layer Compression System - Left Lower Extremity Elevate legs to the level of the heart and pump ankles as often as possible Mckeone, Theadora A. ([id number]) having reviewed her general examination and history I have recommended: 1. scheduling his venous duplex study for reflux. 2. Hydrofera Blue and a 3 layer Profore wrap 3. Elevation and exercise 4. regular visits to the wound center Electronic Signature(s) Signed: 08/23/2016 3:10:01 PM By: Christin Fudge MD, FACS Entered By: Christin Fudge on 08/23/2016 15:10:00 Kahrs, [name] ([id number]) -------------------------------------------------------------------------------- SuperBill Details Patient Name: [name] Date of Service: 08/23/2016 Medical Record Number: [id number] Patient Account Number: [id number] Date of Birth/Sex: [birth year] (75 y.o. Female) Treating  RN: Montey Hora Primary Care Physician: Park Liter Other Clinician: Referring Physician: Park Liter Treating Physician/Extender: Frann Rider in Treatment: 2 Diagnosis Coding ICD-10 Codes Code Description [phone] Non-pressure chronic ulcer of other part of left lower leg with unspecified severity I73.9 Peripheral vascular disease, unspecified I87.312 Chronic venous hypertension (idiopathic) with ulcer of left lower extremity Facility Procedures CPT4: Description Modifier Quantity Code [id number] - DEB SUBQ TISSUE 20 SQ CM/< 1 ICD-10 Description Diagnosis L97.829 Non-pressure chronic ulcer of other part of left lower leg with unspecified severity I73.9 Peripheral vascular disease,  unspecified I87.312 Chronic venous hypertension (idiopathic) with ulcer of left lower extremity Physician Procedures CPT4: Description Modifier Quantity Code [id number] - WC PHYS SUBQ TISS 20 SQ CM 1 ICD-10 Description Diagnosis L97.829 Non-pressure chronic ulcer of other part of left lower leg with unspecified severity I73.9 Peripheral vascular disease, unspecified  I87.312 Chronic venous hypertension (idiopathic) with ulcer of left lower extremity Electronic Signature(s) Signed: 08/23/2016 3:10:22 PM By: Christin Fudge MD, FACS Entered By: Christin Fudge on 08/23/2016 15:10:21

## 2016-08-24 NOTE — Progress Notes (Signed)
[name], [name] ([id number]) Visit Report for 08/23/2016 Arrival Information Details Patient Name: [name], [name] Date of Service: 08/23/2016 2:15 PM Medical Record Number: [id number] Patient Account Number: [id number] Date of Birth/Sex: 06-16-1941 (74 y.o. Female) Treating RN: Cornell Barman Primary Care Physician: Park Liter Other Clinician: Referring Physician: Park Liter Treating Physician/Extender: Frann Rider in Treatment: 2 Visit Information History Since Last Visit Added or deleted any medications: No Patient Arrived: Ambulatory Any new allergies or adverse reactions: No Arrival Time: 14:25 Had a fall or experienced change in No Accompanied By: self activities of daily living that may affect Transfer Assistance: None risk of falls: Patient Identification Verified: Yes Signs or symptoms of abuse/neglect since last No Secondary Verification Process Yes visito Completed: Hospitalized since last visit: No Patient Has Alerts: Yes Has Dressing in Place as Prescribed: Yes Patient Alerts: Patient on Blood Pain Present Now: No Thinner xarelto Electronic Signature(s) Signed: 08/23/2016 3:22:56 PM By: Gretta Cool, RN, BSN, Kim RN, BSN Entered By: Gretta Cool, RN, BSN, Kim on 08/23/2016 14:28:39 [name], [name] ([id number]) -------------------------------------------------------------------------------- Encounter Discharge Information Details Patient Name: [name] Date of Service: 08/23/2016 2:15 PM Medical Record Number: [id number] Patient Account Number: [id number] Date of Birth/Sex: November 01, 1941 (75 y.o. Female) Treating RN: Cornell Barman Primary Care Physician: Park Liter Other Clinician: Referring Physician: Park Liter Treating Physician/Extender: Frann Rider in Treatment: 2 Encounter Discharge Information Items Discharge Pain Level: 0 Discharge Condition: Stable Ambulatory Status: Ambulatory Discharge Destination: Home Transportation: Private  Auto Accompanied By: self Schedule Follow-up Appointment: Yes Medication Reconciliation completed and provided to Patient/Care Yes Alesia Oshields: Provided on Clinical Summary of Care: 08/23/2016 Form Type Recipient Paper Patient MM Electronic Signature(s) Signed: 08/23/2016 3:02:27 PM By: Ruthine Dose Entered By: Ruthine Dose on 08/23/2016 15:02:27 [name], [name] ([id number]) -------------------------------------------------------------------------------- Lower Extremity Assessment Details Patient Name: [name] Date of Service: 08/23/2016 2:15 PM Medical Record Number: [id number] Patient Account Number: [id number] Date of Birth/Sex: 1941/03/31 (75 y.o. Female) Treating RN: Cornell Barman Primary Care Physician: Park Liter Other Clinician: Referring Physician: Park Liter Treating Physician/Extender: Frann Rider in Treatment: 2 Edema Assessment Assessed: [Left: No] [Right: No] E[Left: dema] [Right: :] Calf Left: Right: Point of Measurement: 34 cm From Medial Instep 44.5 cm cm Ankle Left: Right: Point of Measurement: 10 cm From Medial Instep 27 cm cm Vascular Assessment Pulses: Posterior Tibial Dorsalis Pedis Palpable: [Left:Yes] Extremity colors, hair growth, and conditions: Extremity Color: [Left:Hyperpigmented] Hair Growth on Extremity: [Left:Yes] Temperature of Extremity: [Left:Warm] Capillary Refill: [Left:< 3 seconds] Dependent Rubor: [Left:No] Blanched when Elevated: [Left:No] Lipodermatosclerosis: [Left:No] Toe Nail Assessment Left: Right: Thick: No Discolored: No Deformed: No Improper Length and Hygiene: No Electronic Signature(s) Signed: 08/23/2016 3:22:56 PM By: Gretta Cool, RN, BSN, Kim RN, BSN [name], [name] ([id number]) Entered By: Gretta Cool, RN, BSN, Kim on 08/23/2016 14:32:46 [name], [name] ([id number]) -------------------------------------------------------------------------------- Multi Wound Chart Details Patient Name: [name] Date  of Service: 08/23/2016 2:15 PM Medical Record Number: [id number] Patient Account Number: [id number] Date of Birth/Sex: Mar 18, 1941 (75 y.o. Female) Treating RN: Montey Hora Primary Care Physician: Park Liter Other Clinician: Referring Physician: Park Liter Treating Physician/Extender: Frann Rider in Treatment: 2 Vital Signs Height(in): 67 Pulse(bpm): 63 Weight(lbs): 184 Blood Pressure 139/67 (mmHg): Body Mass Index(BMI): 29 Temperature(F): 98.3 Respiratory Rate 18 (breaths/min): Photos: [N/A:N/A] Wound Location: Left Lower Leg - Lateral, Left Lower Leg - Lateral, N/A Proximal Distal Wounding Event: Gradually Appeared Gradually Appeared N/A Primary Etiology: Venous Leg Ulcer Venous Leg Ulcer N/A Comorbid History: Arrhythmia, Hypertension Arrhythmia, Hypertension  N/A Date Acquired: 06/03/2016 06/03/2016 N/A Weeks of Treatment: 2 2 N/A Wound Status: Open Open N/A Measurements L x W x D 0.5x0.3x0.1 1.1x1.2x0.2 N/A (cm) Area (cm) : 0.118 1.037 N/A Volume (cm) : 0.012 0.207 N/A % Reduction in Area: 80.80% 26.70% N/A % Reduction in Volume: 80.30% -46.80% N/A Classification: Full Thickness Without Full Thickness Without N/A Exposed Support Exposed Support Structures Structures Exudate Amount: Large Medium N/A Exudate Type: Serous Serous N/A Exudate Color: amber amber N/A Wound Margin: Flat and Intact Flat and Intact N/A Granulation Amount: Large (67-100%) Large (67-100%) N/A Granulation Quality: Red Pink N/A [name], Lisa A. ([id number]) Necrotic Amount: Small (1-33%) Small (1-33%) N/A Necrotic Tissue: Eschar, Adherent Slough Eschar, Adherent Slough N/A Exposed Structures: Fascia: No Fascia: No N/A Fat: No Fat: No Tendon: No Tendon: No Muscle: No Muscle: No Joint: No Joint: No Bone: No Bone: No Limited to Skin Limited to Skin Breakdown Breakdown Epithelialization: None None N/A Periwound Skin Texture: Edema: No Edema: No N/A Excoriation:  No Excoriation: No Induration: No Induration: No Callus: No Callus: No Crepitus: No Crepitus: No Fluctuance: No Fluctuance: No Friable: No Friable: No Rash: No Rash: No Scarring: No Scarring: No Periwound Skin Moist: Yes Moist: Yes N/A Moisture: Maceration: No Maceration: No Dry/Scaly: No Dry/Scaly: No Periwound Skin Color: Erythema: Yes Atrophie Blanche: No N/A Atrophie Blanche: No Cyanosis: No Cyanosis: No Ecchymosis: No Ecchymosis: No Erythema: No Hemosiderin Staining: No Hemosiderin Staining: No Mottled: No Mottled: No Pallor: No Pallor: No Rubor: No Rubor: No Erythema Location: Circumferential N/A N/A Tenderness on Yes Yes N/A Palpation: Wound Preparation: Ulcer Cleansing: Ulcer Cleansing: N/A Rinsed/Irrigated with Rinsed/Irrigated with Saline Saline Topical Anesthetic Topical Anesthetic Applied: Other: lidocaine Applied: Other: lidocaine 4% 4% Treatment Notes Electronic Signature(s) Signed: 08/23/2016 4:59:11 PM By: Montey Hora Entered By: Montey Hora on 08/23/2016 14:49:17 [name], [name] ([id number]) -------------------------------------------------------------------------------- Multi-Disciplinary Care Plan Details Patient Name: [name] Date of Service: 08/23/2016 2:15 PM Medical Record Number: [id number] Patient Account Number: [id number] Date of Birth/Sex: [birth year] (75 y.o. Female) Treating RN: Montey Hora Primary Care Physician: Park Liter Other Clinician: Referring Physician: Park Liter Treating Physician/Extender: Frann Rider in Treatment: 2 Active Inactive Abuse / Safety / Falls / Self Care Management Nursing Diagnoses: Impaired physical mobility Potential for falls Goals: Patient will remain injury free Date Initiated: 08/09/2016 Goal Status: Active Interventions: Assess fall risk on admission and as needed Notes: Orientation to the Wound Care Program Nursing Diagnoses: Knowledge deficit  related to the wound healing center program Goals: Patient/caregiver will verbalize understanding of the [place] Program Date Initiated: 08/09/2016 Goal Status: Active Interventions: Provide education on orientation to the wound center Notes: Wound/Skin Impairment Nursing Diagnoses: Impaired tissue integrity Goals: Patient/caregiver will verbalize understanding of skin care regimen [name], Lisa A. ([id number]) Date Initiated: 08/09/2016 Goal Status: Active Ulcer/skin breakdown will have a volume reduction of 30% by week 4 Date Initiated: 08/09/2016 Goal Status: Active Ulcer/skin breakdown will have a volume reduction of 50% by week 8 Date Initiated: 08/09/2016 Goal Status: Active Ulcer/skin breakdown will have a volume reduction of 80% by week 12 Date Initiated: 08/09/2016 Goal Status: Active Ulcer/skin breakdown will heal within 14 weeks Date Initiated: 08/09/2016 Goal Status: Active Interventions: Assess patient/caregiver ability to obtain necessary supplies Assess patient/caregiver ability to perform ulcer/skin care regimen upon admission and as needed Assess ulceration(s) every visit Notes: Electronic Signature(s) Signed: 08/23/2016 4:59:11 PM By: Montey Hora Entered By: Montey Hora on 08/23/2016 14:49:00 [name], Lisa A. ([id number]) -------------------------------------------------------------------------------- Pain Assessment Details  Patient Name: SHAINDY, STAVIG Date of Service: 08/23/2016 2:15 PM Medical Record Number: [id number] Patient Account Number: [id number] Date of Birth/Sex: Jul 18, 1941 (75 y.o. Female) Treating RN: Cornell Barman Primary Care Physician: Park Liter Other Clinician: Referring Physician: Park Liter Treating Physician/Extender: Frann Rider in Treatment: 2 Active Problems Location of Pain Severity and Description of Pain Patient Has Paino No Site Locations With Dressing Change: No Pain Management and  Medication Current Pain Management: Notes Topical or injectable lidocaine is offered to patient for acute pain when surgical debridement is performed. If needed, Patient is instructed to use over the counter pain medication for the following 24-48 hours after debridement. Wound care MDs do not prescribed pain medications. Patient has chronic pain or uncontrolled pain. Patient has been instructed to make an appointment with their Primary Care Physician for pain management. Electronic Signature(s) Signed: 08/23/2016 3:22:56 PM By: Gretta Cool, RN, BSN, Kim RN, BSN Entered By: Gretta Cool, RN, BSN, Kim on 08/23/2016 14:29:07 [name], [name] ([id number]) -------------------------------------------------------------------------------- Patient/Caregiver Education Details Patient Name: [name] Date of Service: 08/23/2016 2:15 PM Medical Record Number: [id number] Patient Account Number: [id number] Date of Birth/Gender: Aug 03, 1941 (75 y.o. Female) Treating RN: Cornell Barman Primary Care Physician: Park Liter Other Clinician: Referring Physician: Park Liter Treating Physician/Extender: Frann Rider in Treatment: 2 Education Assessment Education Provided To: Patient Education Topics Provided Wound/Skin Impairment: Handouts: Caring for Your Ulcer Methods: Demonstration Responses: State content correctly Electronic Signature(s) Signed: 08/23/2016 3:22:56 PM By: Gretta Cool, RN, BSN, Kim RN, BSN Entered By: Gretta Cool, RN, BSN, Kim on 08/23/2016 15:01:46 [name], [name] ([id number]) -------------------------------------------------------------------------------- Wound Assessment Details Patient Name: [name] Date of Service: 08/23/2016 2:15 PM Medical Record Number: [id number] Patient Account Number: [id number] Date of Birth/Sex: 12-05-1940 (75 y.o. Female) Treating RN: Cornell Barman Primary Care Physician: Park Liter Other Clinician: Referring Physician: Park Liter Treating  Physician/Extender: Frann Rider in Treatment: 2 Wound Status Wound Number: 1 Primary Etiology: Venous Leg Ulcer Wound Location: Left Lower Leg - Lateral, Wound Status: Open Proximal Comorbid History: Arrhythmia, Hypertension Wounding Event: Gradually Appeared Date Acquired: 06/03/2016 Weeks Of Treatment: 2 Clustered Wound: No Photos Wound Measurements Length: (cm) 0.5 Width: (cm) 0.3 Depth: (cm) 0.1 Area: (cm) 0.118 Volume: (cm) 0.012 % Reduction in Area: 80.8% % Reduction in Volume: 80.3% Epithelialization: None Tunneling: No Undermining: No Wound Description Full Thickness Without Exposed Foul Odor Aft Classification: Support Structures Wound Margin: Flat and Intact Exudate Large Amount: Exudate Type: Serous Exudate Color: amber er Cleansing: No Wound Bed Granulation Amount: Large (67-100%) Exposed Structure Granulation Quality: Red Fascia Exposed: No Necrotic Amount: Small (1-33%) Fat Layer Exposed: No Prest, Makenzie A. ([id number]) Necrotic Quality: Eschar, Adherent Slough Tendon Exposed: No Muscle Exposed: No Joint Exposed: No Bone Exposed: No Limited to Skin Breakdown Periwound Skin Texture Texture Color No Abnormalities Noted: No No Abnormalities Noted: No Callus: No Atrophie Blanche: No Crepitus: No Cyanosis: No Excoriation: No Ecchymosis: No Fluctuance: No Erythema: Yes Friable: No Erythema Location: Circumferential Induration: No Hemosiderin Staining: No Localized Edema: No Mottled: No Rash: No Pallor: No Scarring: No Rubor: No Moisture Temperature / Pain No Abnormalities Noted: No Tenderness on Palpation: Yes Dry / Scaly: No Maceration: No Moist: Yes Wound Preparation Ulcer Cleansing: Rinsed/Irrigated with Saline Topical Anesthetic Applied: Other: lidocaine 4%, Treatment Notes Wound #1 (Left, Proximal, Lateral Lower Leg) 1. Cleansed with: Clean wound with Normal Saline 2. Anesthetic Topical Lidocaine 4% cream to  wound bed prior to debridement 4. Dressing Applied: Hydrafera Blue 5. Secondary Dressing Applied ABD  Pad 7. Secured with 3 Layer Compression System - Left Lower Extremity Electronic Signature(s) Signed: 08/23/2016 3:22:56 PM By: Gretta Cool, RN, BSN, Kim RN, BSN Entered By: Gretta Cool, RN, BSN, Kim on 08/23/2016 14:35:12 [place], [name] ([id number]) -------------------------------------------------------------------------------- Wound Assessment Details Patient Name: [name] Date of Service: 08/23/2016 2:15 PM Medical Record Number: [id number] Patient Account Number: [id number] Date of Birth/Sex: 1941-03-27 (75 y.o. Female) Treating RN: Cornell Barman Primary Care Physician: Park Liter Other Clinician: Referring Physician: Park Liter Treating Physician/Extender: Frann Rider in Treatment: 2 Wound Status Wound Number: 2 Primary Etiology: Venous Leg Ulcer Wound Location: Left Lower Leg - Lateral, Distal Wound Status: Open Wounding Event: Gradually Appeared Comorbid History: Arrhythmia, Hypertension Date Acquired: 06/03/2016 Weeks Of Treatment: 2 Clustered Wound: No Photos Wound Measurements Length: (cm) 1.1 Width: (cm) 1.2 Depth: (cm) 0.2 Area: (cm) 1.037 Volume: (cm) 0.207 % Reduction in Area: 26.7% % Reduction in Volume: -46.8% Epithelialization: None Tunneling: No Undermining: No Wound Description Full Thickness Without Exposed Classification: Support Structures Wound Margin: Flat and Intact Exudate Medium Amount: Exudate Type: Serous Exudate Color: amber Foul Odor After Cleansing: No Wound Bed Granulation Amount: Large (67-100%) Exposed Structure Granulation Quality: Pink Fascia Exposed: No Necrotic Amount: Small (1-33%) Fat Layer Exposed: No Ansell, Ica A. ([id number]) Necrotic Quality: Eschar, Adherent Slough Tendon Exposed: No Muscle Exposed: No Joint Exposed: No Bone Exposed: No Limited to Skin Breakdown Periwound Skin Texture Texture  Color No Abnormalities Noted: No No Abnormalities Noted: No Callus: No Atrophie Blanche: No Crepitus: No Cyanosis: No Excoriation: No Ecchymosis: No Fluctuance: No Erythema: No Friable: No Hemosiderin Staining: No Induration: No Mottled: No Localized Edema: No Pallor: No Rash: No Rubor: No Scarring: No Temperature / Pain Moisture Tenderness on Palpation: Yes No Abnormalities Noted: No Dry / Scaly: No Maceration: No Moist: Yes Wound Preparation Ulcer Cleansing: Rinsed/Irrigated with Saline Topical Anesthetic Applied: Other: lidocaine 4%, Treatment Notes Wound #2 (Left, Distal, Lateral Lower Leg) 1. Cleansed with: Clean wound with Normal Saline 2. Anesthetic Topical Lidocaine 4% cream to wound bed prior to debridement 4. Dressing Applied: Hydrafera Blue 5. Secondary Dressing Applied ABD Pad 7. Secured with 3 Layer Compression System - Left Lower Extremity Electronic Signature(s) Signed: 08/23/2016 3:22:56 PM By: Gretta Cool, RN, BSN, Kim RN, BSN Entered By: Gretta Cool, RN, BSN, Kim on 08/23/2016 14:35:37 [place], [name] ([id number]) -------------------------------------------------------------------------------- Vitals Details Patient Name: [name] Date of Service: 08/23/2016 2:15 PM Medical Record Number: [id number] Patient Account Number: [id number] Date of Birth/Sex: June 30, 1941 (75 y.o. Female) Treating RN: Cornell Barman Primary Care Physician: Park Liter Other Clinician: Referring Physician: Park Liter Treating Physician/Extender: Frann Rider in Treatment: 2 Vital Signs Time Taken: 14:29 Temperature (F): 98.3 Height (in): 67 Pulse (bpm): 63 Weight (lbs): 184 Respiratory Rate (breaths/min): 18 Body Mass Index (BMI): 28.8 Blood Pressure (mmHg): 139/67 Reference Range: 80 - 120 mg / dl Electronic Signature(s) Signed: 08/23/2016 3:22:56 PM By: Gretta Cool, RN, BSN, Kim RN, BSN Entered By: Gretta Cool, RN, BSN, Kim on 08/23/2016 [id number]

## 2016-08-28 ENCOUNTER — Other Ambulatory Visit (INDEPENDENT_AMBULATORY_CARE_PROVIDER_SITE_OTHER): Payer: Self-pay | Admitting: Vascular Surgery

## 2016-08-28 DIAGNOSIS — R609 Edema, unspecified: Secondary | ICD-10-CM

## 2016-08-30 ENCOUNTER — Encounter: Payer: Medicare PPO | Admitting: Surgery

## 2016-08-30 DIAGNOSIS — I87312 Chronic venous hypertension (idiopathic) with ulcer of left lower extremity: Secondary | ICD-10-CM | POA: Diagnosis not present

## 2016-08-31 NOTE — Progress Notes (Signed)
[name], [name] ([id number]) Visit Report for 08/30/2016 Arrival Information Details Patient Name: [name]. Date of Service: 08/30/2016 3:00 PM Medical Record Number: [id number] Patient Account Number: [id number] Date of Birth/Sex: [birth year] (75 y.o. Female) Treating RN: Afful, RN, BSN, Velva Harman Primary Care Physician: Park Liter Other Clinician: Referring Physician: Park Liter Treating Physician/Extender: Frann Rider in Treatment: 3 Visit Information History Since Last Visit All ordered tests and consults were completed: No Patient Arrived: Ambulatory Added or deleted any medications: No Arrival Time: 14:52 Any new allergies or adverse reactions: No Accompanied By: SELF Had a fall or experienced change in No Transfer Assistance: None activities of daily living that may affect Patient Identification Verified: Yes risk of falls: Secondary Verification Process Yes Signs or symptoms of abuse/neglect since last No Completed: visito Patient Has Alerts: Yes Hospitalized since last visit: No Patient Alerts: Patient on Blood Has Compression in Place as Prescribed: Yes Thinner Pain Present Now: Yes xarelto Electronic Signature(s) Signed: 08/30/2016 5:11:59 PM By: Regan Lemming BSN, RN Entered By: Regan Lemming on 08/30/2016 14:53:31 [name] ([id number]) -------------------------------------------------------------------------------- Compression Therapy Details Patient Name: [name] Date of Service: 08/30/2016 3:00 PM Medical Record Number: [id number] Patient Account Number: [id number] Date of Birth/Sex: [birth year] (75 y.o. Female) Treating RN: Baruch Gouty, RN, BSN, Velva Harman Primary Care Physician: Park Liter Other Clinician: Referring Physician: Park Liter Treating Physician/Extender: Frann Rider in Treatment: 3 Compression Therapy Performed for Wound Wound #2 Left,Distal,Lateral Lower Leg Assessment: Performed By: Clinician Baruch Gouty, RN, BSN, Velva Harman,  RN Compression Type: Three Layer Pre Treatment ABI: 1.1 Post Procedure Diagnosis Same as Pre-procedure Electronic Signature(s) Signed: 08/30/2016 4:47:06 PM By: Regan Lemming BSN, RN Entered By: Regan Lemming on 08/30/2016 16:47:06 [name] ([id number]) -------------------------------------------------------------------------------- Encounter Discharge Information Details Patient Name: [name] Date of Service: 08/30/2016 3:00 PM Medical Record Number: [id number] Patient Account Number: [id number] Date of Birth/Sex: [birth year] (75 y.o. Female) Treating RN: Afful, RN, BSN, Velva Harman Primary Care Physician: Park Liter Other Clinician: Referring Physician: Park Liter Treating Physician/Extender: Frann Rider in Treatment: 3 Encounter Discharge Information Items Discharge Pain Level: 0 Discharge Condition: Stable Ambulatory Status: Ambulatory Discharge Destination: Home Transportation: Private Auto Accompanied By: SELF Schedule Follow-up Appointment: No Medication Reconciliation completed and provided to Patient/Care No [name]: Provided on Clinical Summary of Care: 08/30/2016 Form Type Recipient Paper Patient MM Electronic Signature(s) Signed: 08/30/2016 3:27:09 PM By: Ruthine Dose Entered By: Ruthine Dose on 08/30/2016 15:27:08 [name] ([id number]) -------------------------------------------------------------------------------- Lower Extremity Assessment Details Patient Name: [name] Date of Service: 08/30/2016 3:00 PM Medical Record Number: [id number] Patient Account Number: [id number] Date of Birth/Sex: 06-15-1941 (75 y.o. Female) Treating RN: Afful, RN, BSN, Velva Harman Primary Care Physician: Park Liter Other Clinician: Referring Physician: Park Liter Treating Physician/Extender: Frann Rider in Treatment: 3 Edema Assessment Assessed: [Left: No] [Right: No] Edema: [Left: Ye] [Right: s] Calf Left: Right: Point of  Measurement: 34 cm From Medial Instep 45.2 cm cm Ankle Left: Right: Point of Measurement: 10 cm From Medial Instep 27.2 cm cm Vascular Assessment Claudication: Claudication Assessment [Left:None] Pulses: Posterior Tibial Dorsalis Pedis Palpable: [Left:Yes] Extremity colors, hair growth, and conditions: Extremity Color: [Left:Mottled] Hair Growth on Extremity: [Left:Yes] Temperature of Extremity: [Left:Warm] Capillary Refill: [Left:< 3 seconds] Toe Nail Assessment Left: Right: Thick: Yes Discolored: Yes Deformed: No Improper Length and Hygiene: No Electronic Signature(s) Signed: 08/30/2016 5:11:59 PM By: Regan Lemming BSN, RN Entered By: Regan Lemming on 08/30/2016 14:57:16 [name], [name] ([id number]) [name], [name] ([id number]) -------------------------------------------------------------------------------- Multi Wound  Chart Details Patient Name: [name]. Date of Service: 08/30/2016 3:00 PM Medical Record Number: [id number] Patient Account Number: [id number] Date of Birth/Sex: [birth year] (75 y.o. Female) Treating RN: Baruch Gouty, RN, BSN, Velva Harman Primary Care Physician: Park Liter Other Clinician: Referring Physician: Park Liter Treating Physician/Extender: Frann Rider in Treatment: 3 Vital Signs Height(in): 67 Pulse(bpm): 66 Weight(lbs): 184 Blood Pressure 102/68 (mmHg): Body Mass Index(BMI): 29 Temperature(F): 98.1 Respiratory Rate 18 (breaths/min): Photos: [1:No Photos] [2:No Photos] [N/A:N/A] Wound Location: [1:Left Lower Leg - Lateral, Proximal] [2:Left Lower Leg - Lateral, Distal] [N/A:N/A] Wounding Event: [1:Gradually Appeared] [2:Gradually Appeared] [N/A:N/A] Primary Etiology: [1:Venous Leg Ulcer] [2:Venous Leg Ulcer] [N/A:N/A] Comorbid History: [1:Arrhythmia, Hypertension] [2:Arrhythmia, Hypertension] [N/A:N/A] Date Acquired: [1:06/03/2016] [2:06/03/2016] [N/A:N/A] Weeks of Treatment: [1:3] [2:3] [N/A:N/A] Wound Status: [1:Healed - Epithelialized]  [2:Open] [N/A:N/A] Measurements L x W x D 0x0x0 [2:1.8x1.2x0.2] [N/A:N/A] (cm) Area (cm) : [1:0] [2:1.696] [N/A:N/A] Volume (cm) : [1:0] [2:0.339] [N/A:N/A] % Reduction in Area: [1:100.00%] [2:-19.90%] [N/A:N/A] % Reduction in Volume: 100.00% [2:-140.40%] [N/A:N/A] Classification: [1:Full Thickness Without Exposed Support Structures] [2:Full Thickness Without Exposed Support Structures] [N/A:N/A] Exudate Amount: [1:None Present] [2:Medium] [N/A:N/A] Exudate Type: [1:N/A] [2:Serous] [N/A:N/A] Exudate Color: [1:N/A] [2:amber] [N/A:N/A] Wound Margin: [1:Flat and Intact] [2:Flat and Intact] [N/A:N/A] Granulation Amount: [1:None Present (0%)] [2:Large (67-100%)] [N/A:N/A] Granulation Quality: [1:N/A] [2:Pink] [N/A:N/A] Necrotic Amount: [1:None Present (0%)] [2:Small (1-33%)] [N/A:N/A] Necrotic Tissue: [1:N/A] [2:Eschar, Adherent Slough] [N/A:N/A] Exposed Structures: [1:Fascia: No Fat: No Tendon: No Muscle: No] [2:Fascia: No Fat: No Tendon: No Muscle: No] [N/A:N/A] Joint: No Joint: No Bone: No Bone: No Limited to Skin Limited to Skin Breakdown Breakdown Epithelialization: Large (67-100%) None N/A Periwound Skin Texture: Edema: Yes Edema: Yes N/A Excoriation: No Excoriation: No Induration: No Induration: No Callus: No Callus: No Crepitus: No Crepitus: No Fluctuance: No Fluctuance: No Friable: No Friable: No Rash: No Rash: No Scarring: No Scarring: No Periwound Skin Dry/Scaly: Yes Moist: Yes N/A Moisture: Maceration: No Maceration: No Moist: No Dry/Scaly: No Periwound Skin Color: Atrophie Blanche: No Atrophie Blanche: No N/A Cyanosis: No Cyanosis: No Ecchymosis: No Ecchymosis: No Erythema: No Erythema: No Hemosiderin Staining: No Hemosiderin Staining: No Mottled: No Mottled: No Pallor: No Pallor: No Rubor: No Rubor: No Temperature: No Abnormality N/A N/A Tenderness on No Yes N/A Palpation: Wound Preparation: Ulcer Cleansing: Ulcer Cleansing:  N/A Rinsed/Irrigated with Rinsed/Irrigated with Saline, Other: SOAP AND Saline, Other: SOAP AND WATER WATER Topical Anesthetic Topical Anesthetic Applied: None Applied: Other: lidocaine 4% Treatment Notes Electronic Signature(s) Signed: 08/30/2016 5:11:59 PM By: Regan Lemming BSN, RN Entered By: Regan Lemming on 08/30/2016 14:59:58 Tomaro, [name] ([id number]) -------------------------------------------------------------------------------- [place] Details Patient Name: [name] Date of Service: 08/30/2016 3:00 PM Medical Record Number: [id number] Patient Account Number: [id number] Date of Birth/Sex: 08-Mar-1941 (75 y.o. Female) Treating RN: Afful, RN, BSN, Velva Harman Primary Care Physician: Park Liter Other Clinician: Referring Physician: Park Liter Treating Physician/Extender: Frann Rider in Treatment: 3 Active Inactive Abuse / Safety / Falls / Self Care Management Nursing Diagnoses: Impaired physical mobility Potential for falls Goals: Patient will remain injury free Date Initiated: 08/09/2016 Goal Status: Active Interventions: Assess fall risk on admission and as needed Notes: Orientation to the Wound Care Program Nursing Diagnoses: Knowledge deficit related to the wound healing center program Goals: Patient/caregiver will verbalize understanding of the [place] [facility] Date Initiated: 08/09/2016 Goal Status: Active Interventions: Provide education on orientation to the wound center Notes: Wound/Skin Impairment Nursing Diagnoses: Impaired tissue integrity Goals: Patient/caregiver will verbalize understanding of skin care  regimen Guiffre, ZYARIA MANGIAPANE ([id number]) Date Initiated: 08/09/2016 Goal Status: Active Ulcer/skin breakdown will have a volume reduction of 30% by week 4 Date Initiated: 08/09/2016 Goal Status: Active Ulcer/skin breakdown will have a volume reduction of 50% by week 8 Date Initiated: 08/09/2016 Goal Status:  Active Ulcer/skin breakdown will have a volume reduction of 80% by week 12 Date Initiated: 08/09/2016 Goal Status: Active Ulcer/skin breakdown will heal within 14 weeks Date Initiated: 08/09/2016 Goal Status: Active Interventions: Assess patient/caregiver ability to obtain necessary supplies Assess patient/caregiver ability to perform ulcer/skin care regimen upon admission and as needed Assess ulceration(s) every visit Notes: Electronic Signature(s) Signed: 08/30/2016 5:11:59 PM By: Regan Lemming BSN, RN Entered By: Regan Lemming on 08/30/2016 14:59:52 Dollard, [name] ([id number]) -------------------------------------------------------------------------------- Pain Assessment Details Patient Name: [name] Date of Service: 08/30/2016 3:00 PM Medical Record Number: [id number] Patient Account Number: [id number] Date of Birth/Sex: [birth year] (75 y.o. Female) Treating RN: Baruch Gouty, RN, BSN, Velva Harman Primary Care Physician: Park Liter Other Clinician: Referring Physician: Park Liter Treating Physician/Extender: Frann Rider in Treatment: 3 Active Problems Location of Pain Severity and Description of Pain Patient Has Paino Yes Site Locations Pain Location: Pain in Ulcers Rate the pain. Current Pain Level: 4 Character of Pain Describe the Pain: Tender Pain Management and Medication Current Pain Management: How does your pain impact your activities of daily livingo Sleep: Yes Bathing: Yes Appetite: Yes Relationship With Others: Yes Bladder Continence: Yes Emotions: Yes Bowel Continence: Yes Work: Yes Toileting: Yes Drive: Yes Dressing: Yes Hobbies: Yes Electronic Signature(s) Signed: 08/30/2016 5:11:59 PM By: Regan Lemming BSN, RN Entered By: Regan Lemming on 08/30/2016 14:53:48 Steenson, [name] ([id number]) -------------------------------------------------------------------------------- Patient/Caregiver Education Details Patient Name: [name] Date of Service:  08/30/2016 3:00 PM Medical Record Number: [id number] Patient Account Number: [id number] Date of Birth/Gender: Apr 01, 1941 (75 y.o. Female) Treating RN: Baruch Gouty, RN, BSN, Velva Harman Primary Care Physician: Park Liter Other Clinician: Referring Physician: Park Liter Treating Physician/Extender: Frann Rider in Treatment: 3 Education Assessment Education Provided To: Patient Education Topics Provided Welcome To [facility]: Methods: Explain/Verbal Responses: State content correctly Wound Debridement: Methods: Explain/Verbal Responses: State content correctly Wound/Skin Impairment: Methods: Explain/Verbal Responses: State content correctly Electronic Signature(s) Signed: 08/30/2016 5:11:59 PM By: Regan Lemming BSN, RN Entered By: Regan Lemming on 08/30/2016 15:14:45 Sankey, [name] ([id number]) -------------------------------------------------------------------------------- Wound Assessment Details Patient Name: [name] Date of Service: 08/30/2016 3:00 PM Medical Record Number: [id number] Patient Account Number: [id number] Date of Birth/Sex: 1941/02/24 (75 y.o. Female) Treating RN: Afful, RN, BSN, [place] Primary Care Physician: Park Liter Other Clinician: Referring Physician: Park Liter Treating Physician/Extender: Frann Rider in Treatment: 3 Wound Status Wound Number: 1 Primary Etiology: Venous Leg Ulcer Wound Location: Left Lower Leg - Lateral, Wound Status: Healed - Epithelialized Proximal Comorbid History: Arrhythmia, Hypertension Wounding Event: Gradually Appeared Date Acquired: 06/03/2016 Weeks Of Treatment: 3 Clustered Wound: No Photos Photo Uploaded By: Regan Lemming on 08/30/2016 16:57:58 Wound Measurements Length: (cm) 0 % Reduction i Width: (cm) 0 % Reduction i Depth: (cm) 0 Epithelializa Area: (cm) 0 Tunneling: Volume: (cm) 0 Undermining: n Area: 100% n Volume: 100% tion: Large (67-100%) No No Wound Description Full  Thickness Without Exposed Foul Odor Aft Classification: Support Structures Wound Margin: Flat and Intact Exudate None Present Amount: er Cleansing: No Wound Bed Granulation Amount: None Present (0%) Exposed Structure Necrotic Amount: None Present (0%) Fascia Exposed: No Fat Layer Exposed: No Tendon Exposed: No Muscle Exposed: No Joint Exposed: No Portilla, Devanshi A. ([id number])  Bone Exposed: No Limited to Skin Breakdown Periwound Skin Texture Texture Color No Abnormalities Noted: No No Abnormalities Noted: No Callus: No Atrophie Blanche: No Crepitus: No Cyanosis: No Excoriation: No Ecchymosis: No Fluctuance: No Erythema: No Friable: No Hemosiderin Staining: No Induration: No Mottled: No Localized Edema: Yes Pallor: No Rash: No Rubor: No Scarring: No Temperature / Pain Moisture Temperature: No Abnormality No Abnormalities Noted: No Dry / Scaly: Yes Maceration: No Moist: No Wound Preparation Ulcer Cleansing: Rinsed/Irrigated with Saline, Other: SOAP AND WATER, Topical Anesthetic Applied: None Electronic Signature(s) Signed: 08/30/2016 5:11:59 PM By: Regan Lemming BSN, RN Entered By: Regan Lemming on 08/30/2016 14:59:08 Berrones, [name] ([id number]) -------------------------------------------------------------------------------- Wound Assessment Details Patient Name: [name] Date of Service: 08/30/2016 3:00 PM Medical Record Number: [id number] Patient Account Number: [id number] Date of Birth/Sex: 1941/07/11 (75 y.o. Female) Treating RN: Afful, RN, BSN, [place] Primary Care Physician: Park Liter Other Clinician: Referring Physician: Park Liter Treating Physician/Extender: Frann Rider in Treatment: 3 Wound Status Wound Number: 2 Primary Etiology: Venous Leg Ulcer Wound Location: Left Lower Leg - Lateral, Distal Wound Status: Open Wounding Event: Gradually Appeared Comorbid History: Arrhythmia, Hypertension Date Acquired: 06/03/2016 Weeks Of  Treatment: 3 Clustered Wound: No Photos Photo Uploaded By: Regan Lemming on 08/30/2016 16:57:58 Wound Measurements Length: (cm) 1.8 Width: (cm) 1.2 Depth: (cm) 0.2 Area: (cm) 1.696 Volume: (cm) 0.339 % Reduction in Area: -19.9% % Reduction in Volume: -140.4% Epithelialization: None Tunneling: No Undermining: No Wound Description Full Thickness Without Exposed Classification: Support Structures Wound Margin: Flat and Intact Exudate Medium Amount: Exudate Type: Serous Exudate Color: amber Foul Odor After Cleansing: No Wound Bed Granulation Amount: Large (67-100%) Exposed Structure Granulation Quality: Pink Fascia Exposed: No Necrotic Amount: Small (1-33%) Fat Layer Exposed: No Necrotic Quality: Eschar, Adherent Slough Tendon Exposed: No Muscle Exposed: No Trolinger, Patrycja A. ([id number]) Joint Exposed: No Bone Exposed: No Limited to Skin Breakdown Periwound Skin Texture Texture Color No Abnormalities Noted: No No Abnormalities Noted: No Callus: No Atrophie Blanche: No Crepitus: No Cyanosis: No Excoriation: No Ecchymosis: No Fluctuance: No Erythema: No Friable: No Hemosiderin Staining: No Induration: No Mottled: No Localized Edema: Yes Pallor: No Rash: No Rubor: No Scarring: No Temperature / Pain Moisture Tenderness on Palpation: Yes No Abnormalities Noted: No Dry / Scaly: No Maceration: No Moist: Yes Wound Preparation Ulcer Cleansing: Rinsed/Irrigated with Saline, Other: SOAP AND WATER, Topical Anesthetic Applied: Other: lidocaine 4%, Treatment Notes Wound #2 (Left, Distal, Lateral Lower Leg) 1. Cleansed with: Cleanse wound with antibacterial soap and water 3. Peri-wound Care: Barrier cream Moisturizing lotion 4. Dressing Applied: Hydrafera Blue 5. Secondary Dressing Applied ABD Pad 7. Secured with 4-Layer Compression System - Left Lower Extremity Electronic Signature(s) Signed: 08/30/2016 5:11:59 PM By: Regan Lemming BSN, RN Entered By:  Regan Lemming on 08/30/2016 14:59:42 Soberanes, [name] ([id number]) -------------------------------------------------------------------------------- Vitals Details Patient Name: [name] Date of Service: 08/30/2016 3:00 PM Medical Record Number: [id number] Patient Account Number: [id number] Date of Birth/Sex: [birth year] (75 y.o. Female) Treating RN: Afful, RN, BSN, [place] Primary Care Physician: Park Liter Other Clinician: Referring Physician: Park Liter Treating Physician/Extender: Frann Rider in Treatment: 3 Vital Signs Time Taken: 14:53 Temperature (F): 98.1 Height (in): 67 Pulse (bpm): 66 Weight (lbs): 184 Respiratory Rate (breaths/min): 18 Body Mass Index (BMI): 28.8 Blood Pressure (mmHg): 102/68 Reference Range: 80 - 120 mg / dl Electronic Signature(s) Signed: 08/30/2016 5:11:59 PM By: Regan Lemming BSN, RN Entered By: Regan Lemming on 08/30/2016 14:55:03

## 2016-08-31 NOTE — Progress Notes (Addendum)
RAWDA, BENALLY ([id number]) Visit Report for 08/30/2016 Chief Complaint Document Details Patient Name: [name], [name]. Date of Service: 08/30/2016 3:00 PM Medical Record Number: [id number] Patient Account Number: [id number] Date of Birth/Sex: September 22, 1941 (75 y.o. Female) Treating RN: Baruch Gouty, RN, BSN, Velva Harman Primary Care Physician: Park Liter Other Clinician: Referring Physician: Park Liter Treating Physician/Extender: Frann Rider in Treatment: 3 Information Obtained from: Patient Chief Complaint Referral for left lower leg ulcers Electronic Signature(s) Signed: 08/30/2016 3:42:57 PM By: Christin Fudge MD, FACS Entered By: Christin Fudge on 08/30/2016 15:42:57 Pelissier, [name] ([id number]) -------------------------------------------------------------------------------- Debridement Details Patient Name: [name] Date of Service: 08/30/2016 3:00 PM Medical Record Number: [id number] Patient Account Number: [id number] Date of Birth/Sex: Nov 07, 1940 (75 y.o. Female) Treating RN: Afful, RN, BSN, [place] Primary Care Physician: Park Liter Other Clinician: Referring Physician: Park Liter Treating Physician/Extender: Frann Rider in Treatment: 3 Debridement Performed for Wound #2 Left,Distal,Lateral Lower Leg Assessment: Performed By: Physician Christin Fudge, MD Debridement: Debridement Pre-procedure Yes - 15:10 Verification/Time Out Taken: Start Time: 15:10 Pain Control: Lidocaine 4% Topical Solution Level: Skin/Subcutaneous Tissue Total Area Debrided (L x 1.8 (cm) x 1.2 (cm) = 2.16 (cm) W): Tissue and other Viable, Non-Viable, Exudate, Fat, Fibrin/Slough, Subcutaneous material debrided: Instrument: Curette Bleeding: Minimum Hemostasis Achieved: Pressure End Time: 15:14 Procedural Pain: 0 Post Procedural Pain: 0 Response to Treatment: Procedure was tolerated well Post Debridement Measurements of Total Wound Length: (cm) 1.8 Width: (cm) 1.2 Depth: (cm)  0.2 Volume: (cm) 0.339 Character of Wound/Ulcer Post Stable Debridement: Severity of Tissue Post Debridement: Fat layer exposed Post Procedure Diagnosis Same as Pre-procedure Electronic Signature(s) Signed: 08/30/2016 3:42:43 PM By: Christin Fudge MD, FACS Signed: 08/30/2016 5:11:59 PM By: Regan Lemming BSN, RN Previous Signature: 08/30/2016 3:39:38 PM Version By: Christin Fudge MD, FACS Entered By: Christin Fudge on 08/30/2016 15:42:43 Igo, [name] ([id number]) Sinn, [name] ([id number]) -------------------------------------------------------------------------------- HPI Details Patient Name: [name] Date of Service: 08/30/2016 3:00 PM Medical Record Number: [id number] Patient Account Number: [id number] Date of Birth/Sex: 11-11-1940 (75 y.o. Female) Treating RN: Baruch Gouty, RN, BSN, Velva Harman Primary Care Physician: Park Liter Other Clinician: Referring Physician: Park Liter Treating Physician/Extender: Frann Rider in Treatment: 3 History of Present Illness Location: Patient presents with wounds to left lower leg. Quality: stinging Severity: mild Duration: > 2 months Timing: Pain in wound is Intermittent (comes and goes Context: The wound appeared gradually over time Modifying Factors: warm compresses, neosporin, PAD, BLE swelling Associated Signs and Symptoms: redness, drainage, pain HPI Description: This is a pleasant 75 y/o elderly female who was referred today for a chronic non healing ulcers to the left lower leg. Gradually appeared approximately 2 months ago. Self treating with warm compresses and neosporin. She was seen by her PCP on 08/06/16 that prompted referral to our clinic for evaluation and on going management. She denies fever, chills, body aches or malaise. Hx of chronic BLE edema. She denies hx of Diabetes. Positive for HTN and atrial fibrillation. Remote hx of smoking but quit in January of 1980. She denies claudication. She admits to slow healing of  wounds. She also notes a resolving macular rash on the RLE. 08/16/16: pt returns today for f/u regarding left lower leg wounds. she has noticed decrease in pain and redness. she reports noted improvement. she denies systemic s/s of infection. I reviewed xray findings dated 08/09/16 that showed no bony changes. soft tissue induration noted secondary to soft tissue injury. no evidence for abscess. I also reviewed wound culture findings that  showed no organisms or WBCs. nl skin flora. 08/23/2016 --had a culture done which showed normal skin flora and no anaerobes isolated. X-ray of the left tibia and fibula showed no erosive changes or bony destruction. Electronic Signature(s) Signed: 08/30/2016 3:43:08 PM By: Christin Fudge MD, FACS Entered By: Christin Fudge on 08/30/2016 15:43:08 [name], [name] ([id number]) -------------------------------------------------------------------------------- Physical Exam Details Patient Name: [name] Date of Service: 08/30/2016 3:00 PM Medical Record Number: [id number] Patient Account Number: [id number] Date of Birth/Sex: February 11, 1941 (75 y.o. Female) Treating RN: Baruch Gouty, RN, BSN, Velva Harman Primary Care Physician: Park Liter Other Clinician: Referring Physician: Park Liter Treating Physician/Extender: Frann Rider in Treatment: 3 Constitutional . Pulse regular. Respirations normal and unlabored. Afebrile. . Eyes Nonicteric. Reactive to light. Ears, Nose, Mouth, and Throat Lips, teeth, and gums WNL.Marland Kitchen Moist mucosa without lesions. Neck supple and nontender. No palpable supraclavicular or cervical adenopathy. Normal sized without goiter. Respiratory WNL. No retractions.. Breath sounds WNL, No rubs, rales, rhonchi, or wheeze.. Cardiovascular Heart rhythm and rate regular, no murmur or gallop.. Pedal Pulses WNL. No clubbing, cyanosis or edema. Chest Breasts symmetical and no nipple discharge.. Breast tissue WNL, no masses, lumps, or  tenderness.. Lymphatic No adneopathy. No adenopathy. No adenopathy. Musculoskeletal Adexa without tenderness or enlargement.. Digits and nails w/o clubbing, cyanosis, infection, petechiae, ischemia, or inflammatory conditions.. Integumentary (Hair, Skin) No suspicious lesions. No crepitus or fluctuance. No peri-wound warmth or erythema. No masses.Marland Kitchen Psychiatric Judgement and insight Intact.. No evidence of depression, anxiety, or agitation.. Notes the edema is still persistent and using a #3 curet I have sharply debrided the wound and bleeding was controlled with pressure. Electronic Signature(s) Signed: 08/30/2016 3:43:45 PM By: Christin Fudge MD, FACS Entered By: Christin Fudge on 08/30/2016 15:43:45 Chai, [name] ([id number]) -------------------------------------------------------------------------------- Physician Orders Details Patient Name: [name] Date of Service: 08/30/2016 3:00 PM Medical Record Number: [id number] Patient Account Number: [id number] Date of Birth/Sex: [birth year] (75 y.o. Female) Treating RN: Baruch Gouty, RN, BSN, Velva Harman Primary Care Physician: Park Liter Other Clinician: Referring Physician: Park Liter Treating Physician/Extender: Frann Rider in Treatment: 3 Verbal / Phone Orders: Yes Clinician: Afful, RN, BSN, Rita Read Back and Verified: Yes Diagnosis Coding Wound Cleansing Wound #2 Left,Distal,Lateral Lower Leg o Clean wound with Normal Saline. o Cleanse wound with mild soap and water - IN CLINIC o May Shower, gently pat wound dry prior to applying new dressing. Anesthetic Wound #2 Left,Distal,Lateral Lower Leg o Topical Lidocaine 4% cream applied to wound bed prior to debridement Skin Barriers/Peri-Wound Care Wound #2 Left,Distal,Lateral Lower Leg o Barrier cream o Moisturizing lotion Primary Wound Dressing Wound #2 Left,Distal,Lateral Lower Leg o Hydrafera Blue Secondary Dressing Wound #2 Left,Distal,Lateral Lower  Leg o ABD pad Dressing Change Frequency Wound #2 Left,Distal,Lateral Lower Leg o Change dressing every week Follow-up Appointments Wound #2 Left,Distal,Lateral Lower Leg o Return Appointment in 1 week. Edema Control Wound #2 Left,Distal,Lateral Lower Leg o 4-Layer Compression System - Left Lower Extremity o Elevate legs to the level of the heart and pump ankles as often as possible Valverde, Temeka A. ([id number]) Additional Orders / Instructions Wound #2 Left,Distal,Lateral Lower Leg o Increase protein intake. o Activity as tolerated Electronic Signature(s) Signed: 08/30/2016 4:11:36 PM By: Christin Fudge MD, FACS Signed: 08/30/2016 5:11:59 PM By: Regan Lemming BSN, RN Entered By: Regan Lemming on 08/30/2016 15:13:40 Grosch, [name] ([id number]) -------------------------------------------------------------------------------- Problem List Details Patient Name: [name] Date of Service: 08/30/2016 3:00 PM Medical Record Number: [id number] Patient Account Number: [id number] Date of Birth/Sex: 1941/01/14 (  75 y.o. Female) Treating RN: Baruch Gouty, RN, BSN, Velva Harman Primary Care Physician: Park Liter Other Clinician: Referring Physician: Park Liter Treating Physician/Extender: Frann Rider in Treatment: 3 Active Problems ICD-10 Encounter Code Description Active Date Diagnosis L97.829 Non-pressure chronic ulcer of other part of left lower leg 08/09/2016 Yes with unspecified severity I73.9 Peripheral vascular disease, unspecified 08/09/2016 Yes I87.312 Chronic venous hypertension (idiopathic) with ulcer of left 08/09/2016 Yes lower extremity Inactive Problems Resolved Problems Electronic Signature(s) Signed: 08/30/2016 3:39:29 PM By: Christin Fudge MD, FACS Entered By: Christin Fudge on 08/30/2016 15:39:29 Elsberry, [name] ([id number]) -------------------------------------------------------------------------------- Progress Note Details Patient Name: [name] Date  of Service: 08/30/2016 3:00 PM Medical Record Number: [id number] Patient Account Number: [id number] Date of Birth/Sex: 1941-06-19 (75 y.o. Female) Treating RN: Afful, RN, BSN, Velva Harman Primary Care Physician: Park Liter Other Clinician: Referring Physician: Park Liter Treating Physician/Extender: Frann Rider in Treatment: 3 Subjective Chief Complaint Information obtained from Patient Referral for left lower leg ulcers History of Present Illness (HPI) The following HPI elements were documented for the patient's wound: Location: Patient presents with wounds to left lower leg. Quality: stinging Severity: mild Duration: > 2 months Timing: Pain in wound is Intermittent (comes and goes Context: The wound appeared gradually over time Modifying Factors: warm compresses, neosporin, PAD, BLE swelling Associated Signs and Symptoms: redness, drainage, pain This is a pleasant 75 y/o elderly female who was referred today for a chronic non healing ulcers to the left lower leg. Gradually appeared approximately 2 months ago. Self treating with warm compresses and neosporin. She was seen by her PCP on 08/06/16 that prompted referral to our clinic for evaluation and on going management. She denies fever, chills, body aches or malaise. Hx of chronic BLE edema. She denies hx of Diabetes. Positive for HTN and atrial fibrillation. Remote hx of smoking but quit in January of 1980. She denies claudication. She admits to slow healing of wounds. She also notes a resolving macular rash on the RLE. 08/16/16: pt returns today for f/u regarding left lower leg wounds. she has noticed decrease in pain and redness. she reports noted improvement. she denies systemic s/s of infection. I reviewed xray findings dated 08/09/16 that showed no bony changes. soft tissue induration noted secondary to soft tissue injury. no evidence for abscess. I also reviewed wound culture findings that showed no organisms or WBCs. nl  skin flora. 08/23/2016 --had a culture done which showed normal skin flora and no anaerobes isolated. X-ray of the left tibia and fibula showed no erosive changes or bony destruction. Objective Calip, Aasha A. ([id number]) Constitutional Pulse regular. Respirations normal and unlabored. Afebrile. Vitals Time Taken: 2:53 PM, Height: 67 in, Weight: 184 lbs, BMI: 28.8, Temperature: 98.1 F, Pulse: 66 bpm, Respiratory Rate: 18 breaths/min, Blood Pressure: 102/68 mmHg. Eyes Nonicteric. Reactive to light. Ears, Nose, Mouth, and Throat Lips, teeth, and gums WNL.Marland Kitchen Moist mucosa without lesions. Neck supple and nontender. No palpable supraclavicular or cervical adenopathy. Normal sized without goiter. Respiratory WNL. No retractions.. Breath sounds WNL, No rubs, rales, rhonchi, or wheeze.. Cardiovascular Heart rhythm and rate regular, no murmur or gallop.. Pedal Pulses WNL. No clubbing, cyanosis or edema. Chest Breasts symmetical and no nipple discharge.. Breast tissue WNL, no masses, lumps, or tenderness.. Lymphatic No adneopathy. No adenopathy. No adenopathy. Musculoskeletal Adexa without tenderness or enlargement.. Digits and nails w/o clubbing, cyanosis, infection, petechiae, ischemia, or inflammatory conditions.Marland Kitchen Psychiatric Judgement and insight Intact.. No evidence of depression, anxiety, or agitation.. General Notes: the edema is still  persistent and using a #3 curet I have sharply debrided the wound and bleeding was controlled with pressure. Integumentary (Hair, Skin) No suspicious lesions. No crepitus or fluctuance. No peri-wound warmth or erythema. No masses.. Wound #1 status is Healed - Epithelialized. Original cause of wound was Gradually Appeared. The wound is located on the Left,Proximal,Lateral Lower Leg. The wound measures 0cm length x 0cm width x 0cm depth; 0cm^2 area and 0cm^3 volume. The wound is limited to skin breakdown. There is no tunneling or undermining noted. There  is a none present amount of drainage noted. The wound margin is flat and intact. There is no granulation within the wound bed. There is no necrotic tissue within the wound bed. The periwound skin appearance exhibited: Localized Edema, Dry/Scaly. The periwound skin appearance did not exhibit: Callus, Crepitus, Excoriation, Fluctuance, Friable, Induration, Rash, Scarring, Maceration, Moist, Atrophie Blanche, Cyanosis, Ecchymosis, Hemosiderin Staining, Mottled, Pallor, Rubor, Erythema. Balbach, Amayah A. ([id number]) Periwound temperature was noted as No Abnormality. Wound #2 status is Open. Original cause of wound was Gradually Appeared. The wound is located on the Left,Distal,Lateral Lower Leg. The wound measures 1.8cm length x 1.2cm width x 0.2cm depth; 1.696cm^2 area and 0.339cm^3 volume. The wound is limited to skin breakdown. There is no tunneling or undermining noted. There is a medium amount of serous drainage noted. The wound margin is flat and intact. There is large (67-100%) pink granulation within the wound bed. There is a small (1-33%) amount of necrotic tissue within the wound bed including Eschar and Adherent Slough. The periwound skin appearance exhibited: Localized Edema, Moist. The periwound skin appearance did not exhibit: Callus, Crepitus, Excoriation, Fluctuance, Friable, Induration, Rash, Scarring, Dry/Scaly, Maceration, Atrophie Blanche, Cyanosis, Ecchymosis, Hemosiderin Staining, Mottled, Pallor, Rubor, Erythema. The periwound has tenderness on palpation. Assessment Active Problems ICD-10 L97.829 - Non-pressure chronic ulcer of other part of left lower leg with unspecified severity I73.9 - Peripheral vascular disease, unspecified I87.312 - Chronic venous hypertension (idiopathic) with ulcer of left lower extremity Procedures Wound #2 Wound #2 is a Venous Leg Ulcer located on the Left,Distal,Lateral Lower Leg . There was a Skin/Subcutaneous Tissue Debridement [id number])  debridement with total area of 2.16 sq cm performed by Christin Fudge, MD. with the following instrument(s): Curette to remove Viable and Non-Viable tissue/material including Exudate, Fat, Fibrin/Slough, and Subcutaneous after achieving pain control using Lidocaine 4% Topical Solution. A time out was conducted at 15:10, prior to the start of the procedure. A Minimum amount of bleeding was controlled with Pressure. The procedure was tolerated well with a pain level of 0 throughout and a pain level of 0 following the procedure. Post Debridement Measurements: 1.8cm length x 1.2cm width x 0.2cm depth; 0.339cm^3 volume. Character of Wound/Ulcer Post Debridement is stable. Severity of Tissue Post Debridement is: Fat layer exposed. Post procedure Diagnosis Wound #2: Same as Pre-Procedure Wound #2 is a Venous Leg Ulcer located on the Left,Distal,Lateral Lower Leg . There was a Three Layer Compression Therapy Procedure with a pre-treatment ABI of 1.1 by Afful, RN, BSN, Velva Harman, Therapist, sports. Post procedure Diagnosis Wound #2: Same as Pre-Procedure Lookabaugh, Veleta A. ([id number]) Plan Wound Cleansing: Wound #2 Left,Distal,Lateral Lower Leg: Clean wound with Normal Saline. Cleanse wound with mild soap and water - IN CLINIC May Shower, gently pat wound dry prior to applying new dressing. Anesthetic: Wound #2 Left,Distal,Lateral Lower Leg: Topical Lidocaine 4% cream applied to wound bed prior to debridement Skin Barriers/Peri-Wound Care: Wound #2 Left,Distal,Lateral Lower Leg: Barrier cream Moisturizing lotion Primary Wound Dressing: Wound #2  Left,Distal,Lateral Lower Leg: Hydrafera Blue Secondary Dressing: Wound #2 Left,Distal,Lateral Lower Leg: ABD pad Dressing Change Frequency: Wound #2 Left,Distal,Lateral Lower Leg: Change dressing every week Follow-up Appointments: Wound #2 Left,Distal,Lateral Lower Leg: Return Appointment in 1 week. Edema Control: Wound #2 Left,Distal,Lateral Lower Leg: 4-Layer  Compression System - Left Lower Extremity Elevate legs to the level of the heart and pump ankles as often as possible Additional Orders / Instructions: Wound #2 Left,Distal,Lateral Lower Leg: Increase protein intake. Activity as tolerated I have recommended: Brilliant, Tenasia A. ([id number]) 1. scheduling his venous duplex study for reflux -- this appointment is only on December 11. We'll try and get an earlier appointment 2. Hydrofera Blue and a 3 layer Profore wrap 3. Elevation and exercise 4. regular visits to the wound center Electronic Signature(s) Signed: 09/02/2016 4:01:27 PM By: Christin Fudge MD, FACS Previous Signature: 08/30/2016 3:45:31 PM Version By: Christin Fudge MD, FACS Entered By: Christin Fudge on 09/02/2016 16:01:27 Lisa, [name] ([id number]) -------------------------------------------------------------------------------- SuperBill Details Patient Name: [name] Date of Service: 08/30/2016 Medical Record Number: [id number] Patient Account Number: [id number] Date of Birth/Sex: 01/20/1941 (75 y.o. Female) Treating RN: Afful, RN, BSN, [place] Primary Care Physician: Park Liter Other Clinician: Referring Physician: Park Liter Treating Physician/Extender: Frann Rider in Treatment: 3 Diagnosis Coding ICD-10 Codes Code Description [id number] Non-pressure chronic ulcer of other part of left lower leg with unspecified severity I73.9 Peripheral vascular disease, unspecified I87.312 Chronic venous hypertension (idiopathic) with ulcer of left lower extremity Facility Procedures CPT4: Description Modifier Quantity Code [id number] - DEB SUBQ TISSUE 20 SQ CM/< 1 ICD-10 Description Diagnosis L97.829 Non-pressure chronic ulcer of other part of left lower leg with unspecified severity I73.9 Peripheral vascular disease,  unspecified I87.312 Chronic venous hypertension (idiopathic) with ulcer of left lower extremity Physician Procedures CPT4: Description Modifier  Quantity Code [id number] [id number] - WC PHYS SUBQ TISS 20 SQ CM 1 ICD-10 Description Diagnosis L97.829 Non-pressure chronic ulcer of other part of left lower leg with unspecified severity I73.9 Peripheral vascular disease, unspecified  I87.312 Chronic venous hypertension (idiopathic) with ulcer of left lower extremity Electronic Signature(s) Signed: 08/30/2016 3:45:50 PM By: Christin Fudge MD, FACS Entered By: Christin Fudge on 08/30/2016 15:45:50

## 2016-09-06 ENCOUNTER — Encounter: Payer: Medicare PPO | Attending: Surgery | Admitting: Surgery

## 2016-09-06 DIAGNOSIS — I4891 Unspecified atrial fibrillation: Secondary | ICD-10-CM | POA: Insufficient documentation

## 2016-09-06 DIAGNOSIS — I87312 Chronic venous hypertension (idiopathic) with ulcer of left lower extremity: Secondary | ICD-10-CM | POA: Insufficient documentation

## 2016-09-06 DIAGNOSIS — Z87891 Personal history of nicotine dependence: Secondary | ICD-10-CM | POA: Insufficient documentation

## 2016-09-06 DIAGNOSIS — I10 Essential (primary) hypertension: Secondary | ICD-10-CM | POA: Diagnosis not present

## 2016-09-06 DIAGNOSIS — I739 Peripheral vascular disease, unspecified: Secondary | ICD-10-CM | POA: Insufficient documentation

## 2016-09-06 DIAGNOSIS — L97829 Non-pressure chronic ulcer of other part of left lower leg with unspecified severity: Secondary | ICD-10-CM | POA: Diagnosis not present

## 2016-09-07 NOTE — Progress Notes (Signed)
[name] ([id number]) Visit Report for 09/06/2016 Chief Complaint Document Details Patient Name: [name], [name]. Date of Service: 09/06/2016 1:30 PM Medical Record Number: [id number] Patient Account Number: [id number] Date of Birth/Sex: [birth year] (75 y.o. Female) Treating RN: Cornell Barman Primary Care Physician: Park Liter Other Clinician: Referring Physician: Park Liter Treating Physician/Extender: Frann Rider in Treatment: 4 Information Obtained from: Patient Chief Complaint Referral for left lower leg ulcers Electronic Signature(s) Signed: 09/06/2016 2:25:15 PM By: Christin Fudge MD, FACS Entered By: Christin Fudge on 09/06/2016 14:25:15 Steeves, [name] ([id number]) -------------------------------------------------------------------------------- HPI Details Patient Name: [name] Date of Service: 09/06/2016 1:30 PM Medical Record Number: [id number] Patient Account Number: [id number] Date of Birth/Sex: July 02, 1941 (75 y.o. Female) Treating RN: Cornell Barman Primary Care Physician: Park Liter Other Clinician: Referring Physician: Park Liter Treating Physician/Extender: Frann Rider in Treatment: 4 History of Present Illness Location: Patient presents with wounds to left lower leg. Quality: stinging Severity: mild Duration: > 2 months Timing: Pain in wound is Intermittent (comes and goes Context: The wound appeared gradually over time Modifying Factors: warm compresses, neosporin, PAD, BLE swelling Associated Signs and Symptoms: redness, drainage, pain HPI Description: This is a pleasant 75 y/o elderly female who was referred today for a chronic non healing ulcers to the left lower leg. Gradually appeared approximately 2 months ago. Self treating with warm compresses and neosporin. She was seen by her PCP on 08/06/16 that prompted referral to our clinic for evaluation and on going management. She denies fever, chills, body aches or malaise. Hx of  chronic BLE edema. She denies hx of Diabetes. Positive for HTN and atrial fibrillation. Remote hx of smoking but quit in January of 1980. She denies claudication. She admits to slow healing of wounds. She also notes a resolving macular rash on the RLE. 08/16/16: pt returns today for f/u regarding left lower leg wounds. she has noticed decrease in pain and redness. she reports noted improvement. she denies systemic s/s of infection. I reviewed xray findings dated 08/09/16 that showed no bony changes. soft tissue induration noted secondary to soft tissue injury. no evidence for abscess. I also reviewed wound culture findings that showed no organisms or WBCs. nl skin flora. 08/23/2016 --had a culture done which showed normal skin flora and no anaerobes isolated. X-ray of the left tibia and fibula showed no erosive changes or bony destruction. 09/06/2016 -- venous duplex study has not been scheduled to the middle of December but she does not want to go to [facility] for an earlier appointment. Electronic Signature(s) Signed: 09/06/2016 2:25:46 PM By: Christin Fudge MD, FACS Entered By: Christin Fudge on 09/06/2016 14:25:46 Mainor, [name] ([id number]) -------------------------------------------------------------------------------- Physical Exam Details Patient Name: [name] Date of Service: 09/06/2016 1:30 PM Medical Record Number: [id number] Patient Account Number: [id number] Date of Birth/Sex: 1941-03-01 (75 y.o. Female) Treating RN: Cornell Barman Primary Care Physician: Park Liter Other Clinician: Referring Physician: Park Liter Treating Physician/Extender: Frann Rider in Treatment: 4 Constitutional . Pulse regular. Respirations normal and unlabored. Afebrile. . Eyes Nonicteric. Reactive to light. Ears, Nose, Mouth, and Throat Lips, teeth, and gums WNL.Marland Kitchen Moist mucosa without lesions. Neck supple and nontender. No palpable supraclavicular or cervical adenopathy. Normal  sized without goiter. Respiratory WNL. No retractions.. Cardiovascular Pedal Pulses WNL. No clubbing, cyanosis or edema. Lymphatic No adneopathy. No adenopathy. No adenopathy. Musculoskeletal Adexa without tenderness or enlargement.. Digits and nails w/o clubbing, cyanosis, infection, petechiae, ischemia, or inflammatory conditions.. Integumentary (Hair, Skin) No suspicious lesions. No crepitus  or fluctuance. No peri-wound warmth or erythema. No masses.Marland Kitchen Psychiatric Judgement and insight Intact.. No evidence of depression, anxiety, or agitation.. Notes the wound is looking much cleaner today and no sharp debridement was required. Lymphedema is also much better. Electronic Signature(s) Signed: 09/06/2016 2:26:50 PM By: Christin Fudge MD, FACS Entered By: Christin Fudge on 09/06/2016 14:26:50 Cirelli, [name] ([id number]) -------------------------------------------------------------------------------- Physician Orders Details Patient Name: [name] Date of Service: 09/06/2016 1:30 PM Medical Record Number: [id number] Patient Account Number: [id number] Date of Birth/Sex: 1941-08-13 (75 y.o. Female) Treating RN: Cornell Barman Primary Care Physician: Park Liter Other Clinician: Referring Physician: Park Liter Treating Physician/Extender: Frann Rider in Treatment: 4 Verbal / Phone Orders: Yes Clinician: Cornell Barman Read Back and Verified: Yes Diagnosis Coding Wound Cleansing Wound #2 Left,Distal,Lateral Lower Leg o Clean wound with Normal Saline. o Cleanse wound with mild soap and water - IN CLINIC o May Shower, gently pat wound dry prior to applying new dressing. Anesthetic Wound #2 Left,Distal,Lateral Lower Leg o Topical Lidocaine 4% cream applied to wound bed prior to debridement Skin Barriers/Peri-Wound Care Wound #2 Left,Distal,Lateral Lower Leg o Barrier cream o Moisturizing lotion Primary Wound Dressing Wound #2 Left,Distal,Lateral Lower Leg o  Hydrafera Blue Secondary Dressing Wound #2 Left,Distal,Lateral Lower Leg o ABD pad Dressing Change Frequency Wound #2 Left,Distal,Lateral Lower Leg o Change dressing every week Follow-up Appointments Wound #2 Left,Distal,Lateral Lower Leg o Return Appointment in 1 week. Edema Control Wound #2 Left,Distal,Lateral Lower Leg o 4-Layer Compression System - Left Lower Extremity o Elevate legs to the level of the heart and pump ankles as often as possible Meadowcroft, Sairah A. ([id number]) Additional Orders / Instructions Wound #2 Left,Distal,Lateral Lower Leg o Increase protein intake. o Activity as tolerated Electronic Signature(s) Signed: 09/06/2016 3:24:19 PM By: Gretta Cool RN, BSN, Kim RN, BSN Signed: 09/06/2016 4:03:12 PM By: Christin Fudge MD, FACS Entered By: Gretta Cool RN, BSN, Kim on 09/06/2016 14:00:35 Racette, [name] ([id number]) -------------------------------------------------------------------------------- Problem List Details Patient Name: [name] Date of Service: 09/06/2016 1:30 PM Medical Record Number: [id number] Patient Account Number: [id number] Date of Birth/Sex: 07/27/1941 (75 y.o. Female) Treating RN: Cornell Barman Primary Care Physician: Park Liter Other Clinician: Referring Physician: Park Liter Treating Physician/Extender: Frann Rider in Treatment: 4 Active Problems ICD-10 Encounter Code Description Active Date Diagnosis L97.829 Non-pressure chronic ulcer of other part of left lower leg 08/09/2016 Yes with unspecified severity I73.9 Peripheral vascular disease, unspecified 08/09/2016 Yes I87.312 Chronic venous hypertension (idiopathic) with ulcer of left 08/09/2016 Yes lower extremity Inactive Problems Resolved Problems Electronic Signature(s) Signed: 09/06/2016 2:25:09 PM By: Christin Fudge MD, FACS Entered By: Christin Fudge on 09/06/2016 14:25:08 Engdahl, [name]  ([id number]) -------------------------------------------------------------------------------- Progress Note Details Patient Name: [name] Date of Service: 09/06/2016 1:30 PM Medical Record Number: [id number] Patient Account Number: [id number] Date of Birth/Sex: Dec 01, 1940 (75 y.o. Female) Treating RN: Cornell Barman Primary Care Physician: Park Liter Other Clinician: Referring Physician: Park Liter Treating Physician/Extender: Frann Rider in Treatment: 4 Subjective Chief Complaint Information obtained from Patient Referral for left lower leg ulcers History of Present Illness (HPI) The following HPI elements were documented for the patient's wound: Location: Patient presents with wounds to left lower leg. Quality: stinging Severity: mild Duration: > 2 months Timing: Pain in wound is Intermittent (comes and goes Context: The wound appeared gradually over time Modifying Factors: warm compresses, neosporin, PAD, BLE swelling Associated Signs and Symptoms: redness, drainage, pain This is a pleasant 75 y/o elderly female who was referred today for  a chronic non healing ulcers to the left lower leg. Gradually appeared approximately 2 months ago. Self treating with warm compresses and neosporin. She was seen by her PCP on 08/06/16 that prompted referral to our clinic for evaluation and on going management. She denies fever, chills, body aches or malaise. Hx of chronic BLE edema. She denies hx of Diabetes. Positive for HTN and atrial fibrillation. Remote hx of smoking but quit in January of 1980. She denies claudication. She admits to slow healing of wounds. She also notes a resolving macular rash on the RLE. 08/16/16: pt returns today for f/u regarding left lower leg wounds. she has noticed decrease in pain and redness. she reports noted improvement. she denies systemic s/s of infection. I reviewed xray findings dated 08/09/16 that showed no bony changes. soft tissue  induration noted secondary to soft tissue injury. no evidence for abscess. I also reviewed wound culture findings that showed no organisms or WBCs. nl skin flora. 08/23/2016 --had a culture done which showed normal skin flora and no anaerobes isolated. X-ray of the left tibia and fibula showed no erosive changes or bony destruction. 09/06/2016 -- venous duplex study has not been scheduled to the middle of December but she does not want to go to [facility] for an earlier appointment. Sunde, Dayle A. ([id number]) Objective Constitutional Pulse regular. Respirations normal and unlabored. Afebrile. Vitals Time Taken: 1:28 PM, Height: 67 in, Weight: 184 lbs, BMI: 28.8, Temperature: 98.3 F, Pulse: 64 bpm, Respiratory Rate: 16 breaths/min, Blood Pressure: 152/83 mmHg. Eyes Nonicteric. Reactive to light. Ears, Nose, Mouth, and Throat Lips, teeth, and gums WNL.Marland Kitchen Moist mucosa without lesions. Neck supple and nontender. No palpable supraclavicular or cervical adenopathy. Normal sized without goiter. Respiratory WNL. No retractions.. Cardiovascular Pedal Pulses WNL. No clubbing, cyanosis or edema. Lymphatic No adneopathy. No adenopathy. No adenopathy. Musculoskeletal Adexa without tenderness or enlargement.. Digits and nails w/o clubbing, cyanosis, infection, petechiae, ischemia, or inflammatory conditions.Marland Kitchen Psychiatric Judgement and insight Intact.. No evidence of depression, anxiety, or agitation.. General Notes: the wound is looking much cleaner today and no sharp debridement was required. Lymphedema is also much better. Integumentary (Hair, Skin) No suspicious lesions. No crepitus or fluctuance. No peri-wound warmth or erythema. No masses.. Wound #2 status is Open. Original cause of wound was Gradually Appeared. The wound is located on the Left,Distal,Lateral Lower Leg. The wound measures 1.5cm length x 0.9cm width x 0.2cm depth; 1.06cm^2 area and 0.212cm^3 volume. The wound is limited to  skin breakdown. There is no tunneling or undermining noted. There is a medium amount of serous drainage noted. The wound margin is flat and intact. There is medium (34-66%) red, pink granulation within the wound bed. There is a medium (34-66%) amount of necrotic tissue within the wound bed including Adherent Slough. The periwound skin appearance exhibited: Localized Edema, Moist, Ecchymosis. The periwound skin appearance did not exhibit: Callus, Crepitus, Excoriation, Fluctuance, Friable, Induration, Rash, Scarring, Dry/Scaly, Maceration, Atrophie Blanche, Procell, Dina A. ([id number]) Cyanosis, Hemosiderin Staining, Mottled, Pallor, Rubor, Erythema. The periwound has tenderness on palpation. Assessment Active Problems ICD-10 L97.829 - Non-pressure chronic ulcer of other part of left lower leg with unspecified severity I73.9 - Peripheral vascular disease, unspecified I87.312 - Chronic venous hypertension (idiopathic) with ulcer of left lower extremity Plan Wound Cleansing: Wound #2 Left,Distal,Lateral Lower Leg: Clean wound with Normal Saline. Cleanse wound with mild soap and water - IN CLINIC May Shower, gently pat wound dry prior to applying new dressing. Anesthetic: Wound #2 Left,Distal,Lateral Lower Leg: Topical Lidocaine  4% cream applied to wound bed prior to debridement Skin Barriers/Peri-Wound Care: Wound #2 Left,Distal,Lateral Lower Leg: Barrier cream Moisturizing lotion Primary Wound Dressing: Wound #2 Left,Distal,Lateral Lower Leg: Hydrafera Blue Secondary Dressing: Wound #2 Left,Distal,Lateral Lower Leg: ABD pad Dressing Change Frequency: Wound #2 Left,Distal,Lateral Lower Leg: Change dressing every week Follow-up Appointments: Wound #2 Left,Distal,Lateral Lower Leg: Return Appointment in 1 week. Edema Control: Wound #2 Left,Distal,Lateral Lower Leg: 4-Layer Compression System - Left Lower Extremity Elevate legs to the level of the heart and pump ankles as often as  possible Madara, Deborha A. ([id number]) Additional Orders / Instructions: Wound #2 Left,Distal,Lateral Lower Leg: Increase protein intake. Activity as tolerated I have recommended: 1. scheduling her venous duplex study for reflux -- this appointment is only on December 11. We'll try and get an earlier appointment but she does not want to travel to [facility] 2. Hydrofera Blue and a 3 layer Profore wrap 3. Elevation and exercise 4. regular visits to the wound center. Electronic Signature(s) Signed: 09/06/2016 2:28:27 PM By: Christin Fudge MD, FACS Entered By: Christin Fudge on 09/06/2016 14:28:27 Lemarr, [name] ([id number]) -------------------------------------------------------------------------------- SuperBill Details Patient Name: [name] Date of Service: 09/06/2016 Medical Record Number: [id number] Patient Account Number: [id number] Date of Birth/Sex: 1941/03/14 (75 y.o. Female) Treating RN: Cornell Barman Primary Care Physician: Park Liter Other Clinician: Referring Physician: Park Liter Treating Physician/Extender: Frann Rider in Treatment: 4 Diagnosis Coding ICD-10 Codes Code Description [phone] Non-pressure chronic ulcer of other part of left lower leg with unspecified severity I73.9 Peripheral vascular disease, unspecified I87.312 Chronic venous hypertension (idiopathic) with ulcer of left lower extremity Facility Procedures CPT4: Description Modifier Quantity Code [id number] (Facility Use Only) [phone] - [place] LT 1 LEG Physician Procedures CPT4: Description Modifier Quantity Code [id number] - WC PHYS LEVEL 3 - EST PT 1 ICD-10 Description Diagnosis L97.829 Non-pressure chronic ulcer of other part of left lower leg with unspecified severity I73.9 Peripheral vascular disease, unspecified  I87.312 Chronic venous hypertension (idiopathic) with ulcer of left lower extremity Electronic Signature(s) Signed: 09/06/2016 2:28:42 PM By: Christin Fudge  MD, FACS Entered By: Christin Fudge on 09/06/2016 14:28:41

## 2016-09-07 NOTE — Progress Notes (Signed)
[name] ([id number]) Visit Report for 09/06/2016 Arrival Information Details Patient Name: [name], [name]. Date of Service: 09/06/2016 1:30 PM Medical Record Number: [id number] Patient Account Number: [id number] Date of Birth/Sex: 09-Dec-1940 (75 y.o. Female) Treating RN: Cornell Barman Primary Care Physician: Park Liter Other Clinician: Referring Physician: Park Liter Treating Physician/Extender: Frann Rider in Treatment: 4 Visit Information History Since Last Visit Added or deleted any medications: No Patient Arrived: Ambulatory Any new allergies or adverse reactions: No Arrival Time: 13:28 Had a fall or experienced change in No Accompanied By: self activities of daily living that may affect Transfer Assistance: None risk of falls: Patient Identification Verified: Yes Signs or symptoms of abuse/neglect since last No Secondary Verification Process Yes visito Completed: Hospitalized since last visit: No Patient Has Alerts: Yes Has Dressing in Place as Prescribed: Yes Patient Alerts: Patient on Blood Has Compression in Place as Prescribed: Yes Thinner Pain Present Now: No xarelto Electronic Signature(s) Signed: 09/06/2016 3:24:19 PM By: Gretta Cool, RN, BSN, Kim RN, BSN Entered By: Gretta Cool, RN, BSN, Kim on 09/06/2016 13:29:02 Dye, [name] ([id number]) -------------------------------------------------------------------------------- Encounter Discharge Information Details Patient Name: [name] Date of Service: 09/06/2016 1:30 PM Medical Record Number: [id number] Patient Account Number: [id number] Date of Birth/Sex: [birth year] (75 y.o. Female) Treating RN: Cornell Barman Primary Care Physician: Park Liter Other Clinician: Referring Physician: Park Liter Treating Physician/Extender: Frann Rider in Treatment: 4 Encounter Discharge Information Items Discharge Pain Level: 1 Discharge Condition: Stable Ambulatory Status: Ambulatory Discharge  Destination: Home Transportation: Private Auto Accompanied By: self Schedule Follow-up Appointment: Yes Medication Reconciliation completed and provided to Patient/Care Yes Lexianna Weinrich: Provided on Clinical Summary of Care: 09/06/2016 Form Type Recipient Paper Patient MM Electronic Signature(s) Signed: 09/06/2016 3:24:19 PM By: Gretta Cool RN, BSN, Kim RN, BSN Previous Signature: 09/06/2016 2:10:29 PM Version By: Ruthine Dose Entered By: Gretta Cool RN, BSN, Kim on 09/06/2016 14:13:42 Vaneaton, [name] ([id number]) -------------------------------------------------------------------------------- Lower Extremity Assessment Details Patient Name: [name] Date of Service: 09/06/2016 1:30 PM Medical Record Number: [id number] Patient Account Number: [id number] Date of Birth/Sex: September 14, 1941 (75 y.o. Female) Treating RN: Cornell Barman Primary Care Physician: Park Liter Other Clinician: Referring Physician: Park Liter Treating Physician/Extender: Frann Rider in Treatment: 4 Edema Assessment Assessed: [Left: No] [Right: No] E[Left: dema] [Right: :] Calf Left: Right: Point of Measurement: 34 cm From Medial Instep 41.5 cm cm Ankle Left: Right: Point of Measurement: 10 cm From Medial Instep 25.5 cm cm Vascular Assessment Pulses: Posterior Tibial Dorsalis Pedis Palpable: [Left:Yes] Extremity colors, hair growth, and conditions: Extremity Color: [Left:Normal] Hair Growth on Extremity: [Left:Yes] Temperature of Extremity: [Left:Warm] Capillary Refill: [Left:< 3 seconds] Dependent Rubor: [Left:No] Blanched when Elevated: [Left:No] Lipodermatosclerosis: [Left:No] Toe Nail Assessment Left: Right: Thick: Yes Discolored: No Deformed: No Improper Length and Hygiene: No Electronic Signature(s) Signed: 09/06/2016 3:24:19 PM By: Gretta Cool, RN, BSN, Kim RN, BSN Axley, [name] ([id number]) Entered By: Gretta Cool, RN, BSN, Kim on 09/06/2016 13:35:11 Gessel, [name]  ([id number]) -------------------------------------------------------------------------------- Multi Wound Chart Details Patient Name: [name] Date of Service: 09/06/2016 1:30 PM Medical Record Number: [id number] Patient Account Number: [id number] Date of Birth/Sex: 1941-09-12 (75 y.o. Female) Treating RN: Cornell Barman Primary Care Physician: Park Liter Other Clinician: Referring Physician: Park Liter Treating Physician/Extender: Frann Rider in Treatment: 4 Vital Signs Height(in): 67 Pulse(bpm): 64 Weight(lbs): 184 Blood Pressure 152/83 (mmHg): Body Mass Index(BMI): 29 Temperature(F): 98.3 Respiratory Rate 16 (breaths/min): Photos: [N/A:N/A] Wound Location: Left Lower Leg - Lateral, N/A N/A Distal Wounding Event: Gradually Appeared N/A  N/A Primary Etiology: Venous Leg Ulcer N/A N/A Comorbid History: Arrhythmia, Hypertension N/A N/A Date Acquired: 06/03/2016 N/A N/A Weeks of Treatment: 4 N/A N/A Wound Status: Open N/A N/A Measurements L x W x D 1.5x0.9x0.2 N/A N/A (cm) Area (cm) : 1.06 N/A N/A Volume (cm) : 0.212 N/A N/A % Reduction in Area: 25.00% N/A N/A % Reduction in Volume: -50.40% N/A N/A Classification: Full Thickness Without N/A N/A Exposed Support Structures Exudate Amount: Medium N/A N/A Exudate Type: Serous N/A N/A Exudate Color: amber N/A N/A Wound Margin: Flat and Intact N/A N/A Granulation Amount: Medium (34-66%) N/A N/A Granulation Quality: Red, Pink N/A N/A Lacuesta, Deshara A. ([id number]) Necrotic Amount: Medium (34-66%) N/A N/A Exposed Structures: Fascia: No N/A N/A Fat: No Tendon: No Muscle: No Joint: No Bone: No Limited to Skin Breakdown Epithelialization: Small (1-33%) N/A N/A Periwound Skin Texture: Edema: Yes N/A N/A Excoriation: No Induration: No Callus: No Crepitus: No Fluctuance: No Friable: No Rash: No Scarring: No Periwound Skin Moist: Yes N/A N/A Moisture: Maceration: No Dry/Scaly: No Periwound Skin  Color: Ecchymosis: Yes N/A N/A Atrophie Blanche: No Cyanosis: No Erythema: No Hemosiderin Staining: No Mottled: No Pallor: No Rubor: No Tenderness on Yes N/A N/A Palpation: Wound Preparation: Ulcer Cleansing: N/A N/A Rinsed/Irrigated with Saline, Other: SOAP AND WATER Topical Anesthetic Applied: Other: lidocaine 4% Treatment Notes Electronic Signature(s) Signed: 09/06/2016 3:24:19 PM By: Gretta Cool, RN, BSN, Kim RN, BSN Entered By: Gretta Cool, RN, BSN, Kim on 09/06/2016 13:55:52 Hartig, [name] ([id number]) -------------------------------------------------------------------------------- Multi-Disciplinary Care Plan Details Patient Name: [name] Date of Service: 09/06/2016 1:30 PM Medical Record Number: [id number] Patient Account Number: [id number] Date of Birth/Sex: [birth year] (75 y.o. Female) Treating RN: Cornell Barman Primary Care Physician: Park Liter Other Clinician: Referring Physician: Park Liter Treating Physician/Extender: Frann Rider in Treatment: 4 Active Inactive Abuse / Safety / Falls / Self Care Management Nursing Diagnoses: Impaired physical mobility Potential for falls Goals: Patient will remain injury free Date Initiated: 08/09/2016 Goal Status: Active Interventions: Assess fall risk on admission and as needed Notes: Orientation to the Wound Care Program Nursing Diagnoses: Knowledge deficit related to the wound healing center program Goals: Patient/caregiver will verbalize understanding of the [facility] Date Initiated: 08/09/2016 Goal Status: Active Interventions: Provide education on orientation to the wound center Notes: Wound/Skin Impairment Nursing Diagnoses: Impaired tissue integrity Goals: Patient/caregiver will verbalize understanding of skin care regimen Spiers, Aleiya A. ([id number]) Date Initiated: 08/09/2016 Goal Status: Active Ulcer/skin breakdown will have a volume reduction of 30% by week 4 Date Initiated:  08/09/2016 Goal Status: Active Ulcer/skin breakdown will have a volume reduction of 50% by week 8 Date Initiated: 08/09/2016 Goal Status: Active Ulcer/skin breakdown will have a volume reduction of 80% by week 12 Date Initiated: 08/09/2016 Goal Status: Active Ulcer/skin breakdown will heal within 14 weeks Date Initiated: 08/09/2016 Goal Status: Active Interventions: Assess patient/caregiver ability to obtain necessary supplies Assess patient/caregiver ability to perform ulcer/skin care regimen upon admission and as needed Assess ulceration(s) every visit Notes: Electronic Signature(s) Signed: 09/06/2016 3:24:19 PM By: Gretta Cool, RN, BSN, Kim RN, BSN Entered By: Gretta Cool, RN, BSN, Kim on 09/06/2016 13:55:46 Terrance, [name] ([id number]) -------------------------------------------------------------------------------- Pain Assessment Details Patient Name: [name] Date of Service: 09/06/2016 1:30 PM Medical Record Number: [id number] Patient Account Number: [id number] Date of Birth/Sex: June 30, 1941 (75 y.o. Female) Treating RN: Cornell Barman Primary Care Physician: Park Liter Other Clinician: Referring Physician: Park Liter Treating Physician/Extender: Frann Rider in Treatment: 4 Active Problems Location of Pain Severity and  Description of Pain Patient Has Paino No Site Locations With Dressing Change: No Pain Management and Medication Current Pain Management: Electronic Signature(s) Signed: 09/06/2016 3:24:19 PM By: Gretta Cool, RN, BSN, Kim RN, BSN Entered By: Gretta Cool, RN, BSN, Kim on 09/06/2016 13:29:07 Lahti, [name] ([id number]) -------------------------------------------------------------------------------- Patient/Caregiver Education Details Patient Name: [name] Date of Service: 09/06/2016 1:30 PM Medical Record Number: [id number] Patient Account Number: [id number] Date of Birth/Gender: 11-05-1940 (75 y.o. Female) Treating RN: Cornell Barman Primary Care Physician: Park Liter Other Clinician: Referring Physician: Park Liter Treating Physician/Extender: Frann Rider in Treatment: 4 Education Assessment Education Provided To: Patient Education Topics Provided Venous: Handouts: Controlling Swelling with Multilayered Compression Wraps Methods: Demonstration Responses: State content correctly Wound/Skin Impairment: Handouts: Caring for Your Ulcer Methods: Explain/Verbal Responses: State content correctly Electronic Signature(s) Signed: 09/06/2016 3:24:19 PM By: Gretta Cool, RN, BSN, Kim RN, BSN Entered By: Gretta Cool, RN, BSN, Kim on 09/06/2016 14:14:05 Folden, [name] ([id number]) -------------------------------------------------------------------------------- Wound Assessment Details Patient Name: [name] Date of Service: 09/06/2016 1:30 PM Medical Record Number: [id number] Patient Account Number: [id number] Date of Birth/Sex: 07/01/1941 (75 y.o. Female) Treating RN: Cornell Barman Primary Care Physician: Park Liter Other Clinician: Referring Physician: Park Liter Treating Physician/Extender: Frann Rider in Treatment: 4 Wound Status Wound Number: 2 Primary Etiology: Venous Leg Ulcer Wound Location: Left Lower Leg - Lateral, Distal Wound Status: Open Wounding Event: Gradually Appeared Comorbid History: Arrhythmia, Hypertension Date Acquired: 06/03/2016 Weeks Of Treatment: 4 Clustered Wound: No Photos Wound Measurements Length: (cm) 1.5 Width: (cm) 0.9 Depth: (cm) 0.2 Area: (cm) 1.06 Volume: (cm) 0.212 % Reduction in Area: 25% % Reduction in Volume: -50.4% Epithelialization: Small (1-33%) Tunneling: No Undermining: No Wound Description Full Thickness Without Exposed Classification: Support Structures Wound Margin: Flat and Intact Exudate Medium Amount: Exudate Type: Serous Exudate Color: amber Foul Odor After Cleansing: No Wound Bed Granulation Amount: Medium (34-66%) Exposed Structure Granulation  Quality: Red, Pink Fascia Exposed: No Necrotic Amount: Medium (34-66%) Fat Layer Exposed: No Abrams, Emanuella A. ([id number]) Necrotic Quality: Adherent Slough Tendon Exposed: No Muscle Exposed: No Joint Exposed: No Bone Exposed: No Limited to Skin Breakdown Periwound Skin Texture Texture Color No Abnormalities Noted: No No Abnormalities Noted: No Callus: No Atrophie Blanche: No Crepitus: No Cyanosis: No Excoriation: No Ecchymosis: Yes Fluctuance: No Erythema: No Friable: No Hemosiderin Staining: No Induration: No Mottled: No Localized Edema: Yes Pallor: No Rash: No Rubor: No Scarring: No Temperature / Pain Moisture Tenderness on Palpation: Yes No Abnormalities Noted: No Dry / Scaly: No Maceration: No Moist: Yes Wound Preparation Ulcer Cleansing: Rinsed/Irrigated with Saline, Other: SOAP AND WATER, Topical Anesthetic Applied: Other: lidocaine 4%, Treatment Notes Wound #2 (Left, Distal, Lateral Lower Leg) 1. Cleansed with: Clean wound with Normal Saline 2. Anesthetic Topical Lidocaine 4% cream to wound bed prior to debridement 4. Dressing Applied: Hydrafera Blue 5. Secondary Dressing Applied ABD Pad 7. Secured with 4-Layer Compression System - Left Lower Extremity Electronic Signature(s) Signed: 09/06/2016 3:24:19 PM By: Gretta Cool, RN, BSN, Kim RN, BSN Entered By: Gretta Cool, RN, BSN, Kim on 09/06/2016 13:37:25 Hardt, [name] ([id number]) -------------------------------------------------------------------------------- Vitals Details Patient Name: [name] Date of Service: 09/06/2016 1:30 PM Medical Record Number: [id number] Patient Account Number: [id number] Date of Birth/Sex: 1941/09/24 (75 y.o. Female) Treating RN: Cornell Barman Primary Care Physician: Park Liter Other Clinician: Referring Physician: Park Liter Treating Physician/Extender: Frann Rider in Treatment: 4 Vital Signs Time Taken: 13:28 Temperature (F): 98.3 Height (in): 67 Pulse  (bpm): 64 Weight (lbs): 184 Respiratory Rate (  breaths/min): 16 Body Mass Index (BMI): 28.8 Blood Pressure (mmHg): 152/83 Reference Range: 80 - 120 mg / dl Electronic Signature(s) Signed: 09/06/2016 3:24:19 PM By: Gretta Cool, RN, BSN, Kim RN, BSN Entered By: Gretta Cool, RN, BSN, Kim on 09/06/2016 13:29:58

## 2016-09-13 ENCOUNTER — Encounter: Payer: Medicare PPO | Admitting: Surgery

## 2016-09-13 DIAGNOSIS — I87312 Chronic venous hypertension (idiopathic) with ulcer of left lower extremity: Secondary | ICD-10-CM | POA: Diagnosis not present

## 2016-09-14 NOTE — Progress Notes (Addendum)
Lisa, Webster ([id number]) Visit Report for 09/13/2016 Chief Complaint Document Details Patient Name: [name], [name]. Date of Service: 09/13/2016 1:30 PM Medical Record Number: [id number] Patient Account Number: [id number] Date of Birth/Sex: August 19, 1941 (75 y.o. Female) Treating RN: Cornell Barman Primary Care Physician: Park Liter Other Clinician: Referring Physician: Park Liter Treating Physician/Extender: Frann Rider in Treatment: 5 Information Obtained from: Patient Chief Complaint Referral for left lower leg ulcers Electronic Signature(s) Signed: 09/13/2016 2:04:17 PM By: Christin Fudge MD, FACS Entered By: Christin Fudge on 09/13/2016 14:04:17 Lariviere, [name] ([id number]) -------------------------------------------------------------------------------- HPI Details Patient Name: [name] Date of Service: 09/13/2016 1:30 PM Medical Record Number: [id number] Patient Account Number: [id number] Date of Birth/Sex: May 19, 1941 (75 y.o. Female) Treating RN: Cornell Barman Primary Care Physician: Park Liter Other Clinician: Referring Physician: Park Liter Treating Physician/Extender: Frann Rider in Treatment: 5 History of Present Illness Location: Patient presents with wounds to left lower leg. Quality: stinging Severity: mild Duration: > 2 months Timing: Pain in wound is Intermittent (comes and goes Context: The wound appeared gradually over time Modifying Factors: warm compresses, neosporin, PAD, BLE swelling Associated Signs and Symptoms: redness, drainage, pain HPI Description: This is a pleasant 75 y/o elderly female who was referred today for a chronic non healing ulcers to the left lower leg. Gradually appeared approximately 2 months ago. Self treating with warm compresses and neosporin. She was seen by her PCP on 08/06/16 that prompted referral to our clinic for evaluation and on going management. She denies fever, chills, body aches or malaise. Hx of  chronic BLE edema. She denies hx of Diabetes. Positive for HTN and atrial fibrillation. Remote hx of smoking but quit in January of 1980. She denies claudication. She admits to slow healing of wounds. She also notes a resolving macular rash on the RLE. 08/16/16: pt returns today for f/u regarding left lower leg wounds. she has noticed decrease in pain and redness. she reports noted improvement. she denies systemic s/s of infection. I reviewed xray findings dated 08/09/16 that showed no bony changes. soft tissue induration noted secondary to soft tissue injury. no evidence for abscess. I also reviewed wound culture findings that showed no organisms or WBCs. nl skin flora. 08/23/2016 --had a culture done which showed normal skin flora and no anaerobes isolated. X-ray of the left tibia and fibula showed no erosive changes or bony destruction. 09/06/2016 -- venous duplex study has not been scheduled to the middle of December but she does not want to go to [facility] for an earlier appointment. Electronic Signature(s) Signed: 09/13/2016 2:04:26 PM By: Christin Fudge MD, FACS Entered By: Christin Fudge on 09/13/2016 14:04:26 Ostlund, [name] ([id number]) -------------------------------------------------------------------------------- Physical Exam Details Patient Name: [name] Date of Service: 09/13/2016 1:30 PM Medical Record Number: [id number] Patient Account Number: [id number] Date of Birth/Sex: Sep 11, 1941 (75 y.o. Female) Treating RN: Cornell Barman Primary Care Physician: Park Liter Other Clinician: Referring Physician: Park Liter Treating Physician/Extender: Frann Rider in Treatment: 5 Constitutional . Pulse regular. Respirations normal and unlabored. Afebrile. . Eyes Nonicteric. Reactive to light. Ears, Nose, Mouth, and Throat Lips, teeth, and gums WNL.Marland Kitchen Moist mucosa without lesions. Neck supple and nontender. No palpable supraclavicular or cervical adenopathy. Normal  sized without goiter. Respiratory WNL. No retractions.. Cardiovascular Pedal Pulses WNL. No clubbing, cyanosis or edema. Lymphatic No adneopathy. No adenopathy. No adenopathy. Musculoskeletal Adexa without tenderness or enlargement.. Digits and nails w/o clubbing, cyanosis, infection, petechiae, ischemia, or inflammatory conditions.. Integumentary (Hair, Skin) No suspicious lesions. No crepitus  or fluctuance. No peri-wound warmth or erythema. No masses.Marland Kitchen Psychiatric Judgement and insight Intact.. No evidence of depression, anxiety, or agitation.. Notes the edema is much better controlled and the wound is looking clean and no sharp debridement was required today. Electronic Signature(s) Signed: 09/13/2016 2:04:49 PM By: Christin Fudge MD, FACS Entered By: Christin Fudge on 09/13/2016 14:04:49 Arrey, [name] ([id number]) -------------------------------------------------------------------------------- Physician Orders Details Patient Name: [name] Date of Service: 09/13/2016 1:30 PM Medical Record Number: [id number] Patient Account Number: [id number] Date of Birth/Sex: [birth year] (74 y.o. Female) Treating RN: Cornell Barman Primary Care Physician: Park Liter Other Clinician: Referring Physician: Park Liter Treating Physician/Extender: Frann Rider in Treatment: 5 Verbal / Phone Orders: No Diagnosis Coding ICD-10 Coding Code Description [phone] Non-pressure chronic ulcer of other part of left lower leg with unspecified severity I73.9 Peripheral vascular disease, unspecified I87.312 Chronic venous hypertension (idiopathic) with ulcer of left lower extremity Wound Cleansing Wound #2 Left,Distal,Lateral Lower Leg o Cleanse wound with mild soap and water Skin Barriers/Peri-Wound Care o Moisturizing lotion Primary Wound Dressing Wound #2 Left,Distal,Lateral Lower Leg o Hydrafera Blue Secondary Dressing Wound #2 Left,Distal,Lateral Lower Leg o ABD  pad Dressing Change Frequency Wound #2 Left,Distal,Lateral Lower Leg o Change dressing every week Follow-up Appointments Wound #2 Left,Distal,Lateral Lower Leg o Return Appointment in 1 week. Edema Control Wound #2 Left,Distal,Lateral Lower Leg o 4-Layer Compression System - Left Lower Extremity o Support Garment 20-30 mm/Hg pressure to: - Purchase and wear on left; bring right to next appointment. Wardlow, ALA LATHON ([id number]) Electronic Signature(s) Signed: 09/13/2016 4:13:44 PM By: Christin Fudge MD, FACS Signed: 09/13/2016 4:48:58 PM By: Gretta Cool RN, BSN, Kim RN, BSN Entered By: Gretta Cool, RN, BSN, Kim on 09/13/2016 14:22:48 Vandermeulen, [name] ([id number]) -------------------------------------------------------------------------------- Problem List Details Patient Name: [name] Date of Service: 09/13/2016 1:30 PM Medical Record Number: [id number] Patient Account Number: [id number] Date of Birth/Sex: [birth year] (75 y.o. Female) Treating RN: Cornell Barman Primary Care Physician: Park Liter Other Clinician: Referring Physician: Park Liter Treating Physician/Extender: Frann Rider in Treatment: 5 Active Problems ICD-10 Encounter Code Description Active Date Diagnosis L97.829 Non-pressure chronic ulcer of other part of left lower leg 08/09/2016 Yes with unspecified severity I73.9 Peripheral vascular disease, unspecified 08/09/2016 Yes I87.312 Chronic venous hypertension (idiopathic) with ulcer of left 08/09/2016 Yes lower extremity Inactive Problems Resolved Problems Electronic Signature(s) Signed: 09/13/2016 2:04:10 PM By: Christin Fudge MD, FACS Entered By: Christin Fudge on 09/13/2016 14:04:09 Babineau, [name] ([id number]) -------------------------------------------------------------------------------- Progress Note Details Patient Name: [name] Date of Service: 09/13/2016 1:30 PM Medical Record Number: [id number] Patient Account Number: [id number] Date of  Birth/Sex: Dec 08, 1940 (75 y.o. Female) Treating RN: Cornell Barman Primary Care Physician: Park Liter Other Clinician: Referring Physician: Park Liter Treating Physician/Extender: Frann Rider in Treatment: 5 Subjective Chief Complaint Information obtained from Patient Referral for left lower leg ulcers History of Present Illness (HPI) The following HPI elements were documented for the patient's wound: Location: Patient presents with wounds to left lower leg. Quality: stinging Severity: mild Duration: > 2 months Timing: Pain in wound is Intermittent (comes and goes Context: The wound appeared gradually over time Modifying Factors: warm compresses, neosporin, PAD, BLE swelling Associated Signs and Symptoms: redness, drainage, pain This is a pleasant 75 y/o elderly female who was referred today for a chronic non healing ulcers to the left lower leg. Gradually appeared approximately 2 months ago. Self treating with warm compresses and neosporin. She was seen by her PCP on 08/06/16 that prompted referral to  our clinic for evaluation and on going management. She denies fever, chills, body aches or malaise. Hx of chronic BLE edema. She denies hx of Diabetes. Positive for HTN and atrial fibrillation. Remote hx of smoking but quit in January of 1980. She denies claudication. She admits to slow healing of wounds. She also notes a resolving macular rash on the RLE. 08/16/16: pt returns today for f/u regarding left lower leg wounds. she has noticed decrease in pain and redness. she reports noted improvement. she denies systemic s/s of infection. I reviewed xray findings dated 08/09/16 that showed no bony changes. soft tissue induration noted secondary to soft tissue injury. no evidence for abscess. I also reviewed wound culture findings that showed no organisms or WBCs. nl skin flora. 08/23/2016 --had a culture done which showed normal skin flora and no anaerobes isolated. X-ray of the  left tibia and fibula showed no erosive changes or bony destruction. 09/06/2016 -- venous duplex study has not been scheduled to the middle of December but she does not want to go to [facility] for an earlier appointment. Alonge, Clarissia A. ([id number]) Objective Constitutional Pulse regular. Respirations normal and unlabored. Afebrile. Vitals Time Taken: 1:34 PM, Height: 67 in, Weight: 184 lbs, BMI: 28.8, Temperature: 98.2 F, Pulse: 68 bpm, Respiratory Rate: 16 breaths/min, Blood Pressure: 158/91 mmHg. Eyes Nonicteric. Reactive to light. Ears, Nose, Mouth, and Throat Lips, teeth, and gums WNL.Marland Kitchen Moist mucosa without lesions. Neck supple and nontender. No palpable supraclavicular or cervical adenopathy. Normal sized without goiter. Respiratory WNL. No retractions.. Cardiovascular Pedal Pulses WNL. No clubbing, cyanosis or edema. Lymphatic No adneopathy. No adenopathy. No adenopathy. Musculoskeletal Adexa without tenderness or enlargement.. Digits and nails w/o clubbing, cyanosis, infection, petechiae, ischemia, or inflammatory conditions.Marland Kitchen Psychiatric Judgement and insight Intact.. No evidence of depression, anxiety, or agitation.. General Notes: the edema is much better controlled and the wound is looking clean and no sharp debridement was required today. Integumentary (Hair, Skin) No suspicious lesions. No crepitus or fluctuance. No peri-wound warmth or erythema. No masses.. Wound #2 status is Open. Original cause of wound was Gradually Appeared. The wound is located on the Left,Distal,Lateral Lower Leg. The wound measures 0.6cm length x 0.5cm width x 0.2cm depth; 0.236cm^2 area and 0.047cm^3 volume. The wound is limited to skin breakdown. There is no tunneling or undermining noted. There is a medium amount of serous drainage noted. The wound margin is flat and intact. There is medium (34-66%) red, pink granulation within the wound bed. There is a small (1-33%) amount of  necrotic tissue within the wound bed including Adherent Slough. The periwound skin appearance exhibited: Localized Edema, Scarring, Moist, Ecchymosis. The periwound skin appearance did not exhibit: Callus, Crepitus, Excoriation, Fluctuance, Friable, Induration, Rash, Dry/Scaly, Maceration, Atrophie Blanche, Venezia, Rhyli A. ([id number]) Cyanosis, Hemosiderin Staining, Mottled, Pallor, Rubor, Erythema. The periwound has tenderness on palpation. Assessment Active Problems ICD-10 L97.829 - Non-pressure chronic ulcer of other part of left lower leg with unspecified severity I73.9 - Peripheral vascular disease, unspecified I87.312 - Chronic venous hypertension (idiopathic) with ulcer of left lower extremity Procedures Wound #2 Wound #2 is a Venous Leg Ulcer located on the Left,Distal,Lateral Lower Leg . There was a Four Layer Compression Therapy Procedure with a pre-treatment ABI of 1.1 by Cornell Barman, RN. Post procedure Diagnosis Wound #2: Same as Pre-Procedure Plan Wound Cleansing: Wound #2 Left,Distal,Lateral Lower Leg: Cleanse wound with mild soap and water Skin Barriers/Peri-Wound Care: Moisturizing lotion Primary Wound Dressing: Wound #2 Left,Distal,Lateral Lower Leg: Hydrafera Blue Secondary Dressing: Wound #  2 Left,Distal,Lateral Lower Leg: ABD pad Dressing Change Frequency: Wound #2 Left,Distal,Lateral Lower Leg: Change dressing every week Follow-up Appointments: Keizer, Giulliana A. ([id number]) Wound #2 Left,Distal,Lateral Lower Leg: Return Appointment in 1 week. Edema Control: Wound #2 Left,Distal,Lateral Lower Leg: 4-Layer Compression System - Left Lower Extremity Support Garment 20-30 mm/Hg pressure to: - Purchase and wear on left; bring right to next appointment. I have recommended: 1. Hydrofera Blue and a 3 layer Profore wrap 2. Elevation and exercise 3. regular visits to the wound center. 4. she is also urged to get compression stockings of the 30-40 mm variety and she  will get these from [place], as her insurance will not cover this Electronic Signature(s) Signed: 09/13/2016 4:14:51 PM By: Christin Fudge MD, FACS Previous Signature: 09/13/2016 2:05:44 PM Version By: Christin Fudge MD, FACS Entered By: Christin Fudge on 09/13/2016 16:14:51 Haque, [name] ([id number]) -------------------------------------------------------------------------------- SuperBill Details Patient Name: [name] Date of Service: 09/13/2016 Medical Record Number: [id number] Patient Account Number: [id number] Date of Birth/Sex: 01-19-1941 (75 y.o. Female) Treating RN: Cornell Barman Primary Care Physician: Park Liter Other Clinician: Referring Physician: Park Liter Treating Physician/Extender: Frann Rider in Treatment: 5 Diagnosis Coding ICD-10 Codes Code Description [id number] Non-pressure chronic ulcer of other part of left lower leg with unspecified severity I73.9 Peripheral vascular disease, unspecified I87.312 Chronic venous hypertension (idiopathic) with ulcer of left lower extremity Facility Procedures CPT4: Description Modifier Quantity Code [id number] (Facility Use Only) [phone] - [place] LT 1 LEG Physician Procedures CPT4: Description Modifier Quantity Code [id number] - WC PHYS LEVEL 3 - EST PT 1 ICD-10 Description Diagnosis L97.829 Non-pressure chronic ulcer of other part of left lower leg with unspecified severity I73.9 Peripheral vascular disease, unspecified  I87.312 Chronic venous hypertension (idiopathic) with ulcer of left lower extremity Electronic Signature(s) Signed: 09/13/2016 4:48:58 PM By: Gretta Cool, RN, BSN, Kim RN, BSN Previous Signature: 09/13/2016 2:06:06 PM Version By: Christin Fudge MD, FACS Entered By: Gretta Cool, RN, BSN, Kim on 09/13/2016 16:37:21

## 2016-09-14 NOTE — Progress Notes (Signed)
KERILEE, RZEPECKI ([id number]) Visit Report for 09/13/2016 Arrival Information Details Patient Name: [name], [name]. Date of Service: 09/13/2016 1:30 PM Medical Record Number: [id number] Patient Account Number: [id number] Date of Birth/Sex: 01-24-1941 (75 y.o. Female) Treating RN: Cornell Barman Primary Care Physician: Park Liter Other Clinician: Referring Physician: Park Liter Treating Physician/Extender: Frann Rider in Treatment: 5 Visit Information History Since Last Visit Added or deleted any medications: No Patient Arrived: Ambulatory Any new allergies or adverse reactions: No Arrival Time: 13:33 Had a fall or experienced change in No Accompanied By: self activities of daily living that may affect Transfer Assistance: None risk of falls: Patient Identification Verified: Yes Signs or symptoms of abuse/neglect since last No Secondary Verification Process Yes visito Completed: Hospitalized since last visit: No Patient Has Alerts: Yes Has Dressing in Place as Prescribed: Yes Patient Alerts: Patient on Blood Has Compression in Place as Prescribed: Yes Thinner Pain Present Now: No xarelto Electronic Signature(s) Signed: 09/13/2016 4:48:58 PM By: Gretta Cool, RN, BSN, Kim RN, BSN Entered By: Gretta Cool, RN, BSN, Kim on 09/13/2016 13:34:06 Pulcini, [name] ([id number]) -------------------------------------------------------------------------------- Compression Therapy Details Patient Name: [name] Date of Service: 09/13/2016 1:30 PM Medical Record Number: [id number] Patient Account Number: [id number] Date of Birth/Sex: 04/19/1941 (75 y.o. Female) Treating RN: Cornell Barman Primary Care Physician: Park Liter Other Clinician: Referring Physician: Park Liter Treating Physician/Extender: Frann Rider in Treatment: 5 Compression Therapy Performed for Wound Wound #2 Left,Distal,Lateral Lower Leg Assessment: Performed By: Clinician Cornell Barman, RN Compression Type:  Four Layer Pre Treatment ABI: 1.1 Post Procedure Diagnosis Same as Pre-procedure Electronic Signature(s) Signed: 09/13/2016 4:48:58 PM By: Gretta Cool, RN, BSN, Kim RN, BSN Entered By: Gretta Cool, RN, BSN, Kim on 09/13/2016 14:12:58 Bendix, [name] ([id number]) -------------------------------------------------------------------------------- Encounter Discharge Information Details Patient Name: [name] Date of Service: 09/13/2016 1:30 PM Medical Record Number: [id number] Patient Account Number: [id number] Date of Birth/Sex: 02-Dec-1940 (75 y.o. Female) Treating RN: Cornell Barman Primary Care Physician: Park Liter Other Clinician: Referring Physician: Park Liter Treating Physician/Extender: Frann Rider in Treatment: 5 Encounter Discharge Information Items Schedule Follow-up Appointment: No Medication Reconciliation completed and provided to Patient/Care No Alanmichael Barmore: Provided on Clinical Summary of Care: 09/13/2016 Form Type Recipient Paper Patient MM Electronic Signature(s) Signed: 09/13/2016 2:13:01 PM By: Ruthine Dose Entered By: Ruthine Dose on 09/13/2016 14:13:01 Zarling, [name] ([id number]) -------------------------------------------------------------------------------- Lower Extremity Assessment Details Patient Name: [name] Date of Service: 09/13/2016 1:30 PM Medical Record Number: [id number] Patient Account Number: [id number] Date of Birth/Sex: [birth year] (75 y.o. Female) Treating RN: Cornell Barman Primary Care Physician: Park Liter Other Clinician: Referring Physician: Park Liter Treating Physician/Extender: Frann Rider in Treatment: 5 Edema Assessment Assessed: [Left: No] [Right: No] E[Left: dema] [Right: :] Calf Left: Right: Point of Measurement: 34 cm From Medial Instep 40 cm cm Ankle Left: Right: Point of Measurement: 10 cm From Medial Instep 24.4 cm cm Vascular Assessment Pulses: Posterior Tibial Dorsalis Pedis Palpable:  [Left:Yes] Extremity colors, hair growth, and conditions: Extremity Color: [Left:Normal] Hair Growth on Extremity: [Left:Yes] Temperature of Extremity: [Left:Warm] Capillary Refill: [Left:< 3 seconds] Dependent Rubor: [Left:No] Blanched when Elevated: [Left:No] Lipodermatosclerosis: [Left:No] Toe Nail Assessment Left: Right: Thick: No Discolored: No Deformed: No Improper Length and Hygiene: No Electronic Signature(s) Signed: 09/13/2016 4:48:58 PM By: Gretta Cool, RN, BSN, Kim RN, BSN Beavin, [name] ([id number]) Entered By: Gretta Cool, RN, BSN, Kim on 09/13/2016 13:40:17 Borchard, [name] ([id number]) -------------------------------------------------------------------------------- Multi Wound Chart Details Patient Name: [name] Date of Service: 09/13/2016 1:30 PM Medical  Record Number: [id number] Patient Account Number: [id number] Date of Birth/Sex: January 06, 1941 (75 y.o. Female) Treating RN: Cornell Barman Primary Care Physician: Park Liter Other Clinician: Referring Physician: Park Liter Treating Physician/Extender: Frann Rider in Treatment: 5 Vital Signs Height(in): 67 Pulse(bpm): 68 Weight(lbs): 184 Blood Pressure 158/91 (mmHg): Body Mass Index(BMI): 29 Temperature(F): 98.2 Respiratory Rate 16 (breaths/min): Photos: [N/A:N/A] Wound Location: Left Lower Leg - Lateral, N/A N/A Distal Wounding Event: Gradually Appeared N/A N/A Primary Etiology: Venous Leg Ulcer N/A N/A Comorbid History: Arrhythmia, Hypertension N/A N/A Date Acquired: 06/03/2016 N/A N/A Weeks of Treatment: 5 N/A N/A Wound Status: Open N/A N/A Measurements L x W x D 0.6x0.5x0.2 N/A N/A (cm) Area (cm) : 0.236 N/A N/A Volume (cm) : 0.047 N/A N/A % Reduction in Area: 83.30% N/A N/A % Reduction in Volume: 66.70% N/A N/A Classification: Full Thickness Without N/A N/A Exposed Support Structures Exudate Amount: Medium N/A N/A Exudate Type: Serous N/A N/A Exudate Color: amber N/A N/A Wound  Margin: Flat and Intact N/A N/A Granulation Amount: Medium (34-66%) N/A N/A Granulation Quality: Red, Pink N/A N/A Necrotic Amount: Small (1-33%) N/A N/A Greeson, Avanelle A. ([id number]) Exposed Structures: Fascia: No N/A N/A Fat: No Tendon: No Muscle: No Joint: No Bone: No Limited to Skin Breakdown Epithelialization: Small (1-33%) N/A N/A Periwound Skin Texture: Edema: Yes N/A N/A Scarring: Yes Excoriation: No Induration: No Callus: No Crepitus: No Fluctuance: No Friable: No Rash: No Periwound Skin Moist: Yes N/A N/A Moisture: Maceration: No Dry/Scaly: No Periwound Skin Color: Ecchymosis: Yes N/A N/A Atrophie Blanche: No Cyanosis: No Erythema: No Hemosiderin Staining: No Mottled: No Pallor: No Rubor: No Tenderness on Yes N/A N/A Palpation: Wound Preparation: Ulcer Cleansing: N/A N/A Rinsed/Irrigated with Saline, Other: SOAP AND WATER Topical Anesthetic Applied: Other: lidocaine 4% Treatment Notes Electronic Signature(s) Signed: 09/13/2016 4:48:58 PM By: Gretta Cool, RN, BSN, Kim RN, BSN Entered By: Gretta Cool, RN, BSN, Kim on 09/13/2016 13:59:03 Oki, [name] ([id number]) -------------------------------------------------------------------------------- Multi-Disciplinary Care Plan Details Patient Name: [name] Date of Service: 09/13/2016 1:30 PM Medical Record Number: [id number] Patient Account Number: [id number] Date of Birth/Sex: 1941-06-17 (75 y.o. Female) Treating RN: Cornell Barman Primary Care Physician: Park Liter Other Clinician: Referring Physician: Park Liter Treating Physician/Extender: Frann Rider in Treatment: 5 Active Inactive Abuse / Safety / Falls / Self Care Management Nursing Diagnoses: Impaired physical mobility Potential for falls Goals: Patient will remain injury free Date Initiated: 08/09/2016 Goal Status: Active Interventions: Assess fall risk on admission and as needed Notes: Orientation to the Wound Care  Program Nursing Diagnoses: Knowledge deficit related to the wound healing center program Goals: Patient/caregiver will verbalize understanding of the [facility] Date Initiated: 08/09/2016 Goal Status: Active Interventions: Provide education on orientation to the wound center Notes: Wound/Skin Impairment Nursing Diagnoses: Impaired tissue integrity Goals: Patient/caregiver will verbalize understanding of skin care regimen Yohannes, Laruen A. ([id number]) Date Initiated: 08/09/2016 Goal Status: Active Ulcer/skin breakdown will have a volume reduction of 30% by week 4 Date Initiated: 08/09/2016 Goal Status: Active Ulcer/skin breakdown will have a volume reduction of 50% by week 8 Date Initiated: 08/09/2016 Goal Status: Active Ulcer/skin breakdown will have a volume reduction of 80% by week 12 Date Initiated: 08/09/2016 Goal Status: Active Ulcer/skin breakdown will heal within 14 weeks Date Initiated: 08/09/2016 Goal Status: Active Interventions: Assess patient/caregiver ability to obtain necessary supplies Assess patient/caregiver ability to perform ulcer/skin care regimen upon admission and as needed Assess ulceration(s) every visit Notes: Electronic Signature(s) Signed: 09/13/2016 4:48:58 PM By: Gretta Cool, RN, BSN,  Maudie Mercury RN, BSN Entered By: Gretta Cool, RN, BSN, Kim on 09/13/2016 13:58:56 Couey, [name] ([id number]) -------------------------------------------------------------------------------- Pain Assessment Details Patient Name: [name] Date of Service: 09/13/2016 1:30 PM Medical Record Number: [id number] Patient Account Number: [id number] Date of Birth/Sex: 1941/01/13 (75 y.o. Female) Treating RN: Cornell Barman Primary Care Physician: Park Liter Other Clinician: Referring Physician: Park Liter Treating Physician/Extender: Frann Rider in Treatment: 5 Active Problems Location of Pain Severity and Description of Pain Patient Has Paino No Site  Locations With Dressing Change: No Pain Management and Medication Current Pain Management: Electronic Signature(s) Signed: 09/13/2016 4:48:58 PM By: Gretta Cool, RN, BSN, Kim RN, BSN Entered By: Gretta Cool, RN, BSN, Kim on 09/13/2016 13:34:12 Gouveia, [name] ([id number]) -------------------------------------------------------------------------------- Patient/Caregiver Education Details Patient Name: [name] Date of Service: 09/13/2016 1:30 PM Medical Record Number: [id number] Patient Account Number: [id number] Date of Birth/Gender: [birth year] (75 y.o. Female) Treating RN: Cornell Barman Primary Care Physician: Park Liter Other Clinician: Referring Physician: Park Liter Treating Physician/Extender: Frann Rider in Treatment: 5 Education Assessment Education Provided To: Patient Education Topics Provided Wound/Skin Impairment: Handouts: Caring for Your Ulcer Methods: Demonstration Responses: State content correctly Electronic Signature(s) Signed: 09/13/2016 4:48:58 PM By: Gretta Cool, RN, BSN, Kim RN, BSN Entered By: Gretta Cool, RN, BSN, Kim on 09/13/2016 14:21:01 Biever, [name] ([id number]) -------------------------------------------------------------------------------- Wound Assessment Details Patient Name: [name] Date of Service: 09/13/2016 1:30 PM Medical Record Number: [id number] Patient Account Number: [id number] Date of Birth/Sex: 1941-08-28 (75 y.o. Female) Treating RN: Cornell Barman Primary Care Physician: Park Liter Other Clinician: Referring Physician: Park Liter Treating Physician/Extender: Frann Rider in Treatment: 5 Wound Status Wound Number: 2 Primary Etiology: Venous Leg Ulcer Wound Location: Left Lower Leg - Lateral, Distal Wound Status: Open Wounding Event: Gradually Appeared Comorbid History: Arrhythmia, Hypertension Date Acquired: 06/03/2016 Weeks Of Treatment: 5 Clustered Wound: No Photos Wound Measurements Length: (cm) 0.6 Width:  (cm) 0.5 Depth: (cm) 0.2 Area: (cm) 0.236 Volume: (cm) 0.047 % Reduction in Area: 83.3% % Reduction in Volume: 66.7% Epithelialization: Small (1-33%) Tunneling: No Undermining: No Wound Description Full Thickness Without Exposed Classification: Support Structures Wound Margin: Flat and Intact Exudate Medium Amount: Exudate Type: Serous Exudate Color: amber Foul Odor After Cleansing: No Wound Bed Granulation Amount: Medium (34-66%) Exposed Structure Granulation Quality: Red, Pink Fascia Exposed: No Necrotic Amount: Small (1-33%) Fat Layer Exposed: No Necrotic Quality: Adherent Slough Tendon Exposed: No Muscle Exposed: No Joint Exposed: No Rami, Myangel A. ([id number]) Bone Exposed: No Limited to Skin Breakdown Periwound Skin Texture Texture Color No Abnormalities Noted: No No Abnormalities Noted: No Callus: No Atrophie Blanche: No Crepitus: No Cyanosis: No Excoriation: No Ecchymosis: Yes Fluctuance: No Erythema: No Friable: No Hemosiderin Staining: No Induration: No Mottled: No Localized Edema: Yes Pallor: No Rash: No Rubor: No Scarring: Yes Temperature / Pain Moisture Tenderness on Palpation: Yes No Abnormalities Noted: No Dry / Scaly: No Maceration: No Moist: Yes Wound Preparation Ulcer Cleansing: Rinsed/Irrigated with Saline, Other: SOAP AND WATER, Topical Anesthetic Applied: Other: lidocaine 4%, Treatment Notes Wound #2 (Left, Distal, Lateral Lower Leg) 1. Cleansed with: Clean wound with Normal Saline 2. Anesthetic Topical Lidocaine 4% cream to wound bed prior to debridement 4. Dressing Applied: Hydrafera Blue 5. Secondary Dressing Applied ABD Pad 7. Secured with 4-Layer Compression System - Left Lower Extremity Electronic Signature(s) Signed: 09/13/2016 4:48:58 PM By: Gretta Cool, RN, BSN, Kim RN, BSN Entered By: Gretta Cool, RN, BSN, Kim on 09/13/2016 13:42:14 Amster, [name]  ([id number]) -------------------------------------------------------------------------------- Vitals Details Patient Name: Schimming, Lakita A. Date  of Service: 09/13/2016 1:30 PM Medical Record Number: [id number] Patient Account Number: [id number] Date of Birth/Sex: 1941/02/02 (75 y.o. Female) Treating RN: Cornell Barman Primary Care Physician: Park Liter Other Clinician: Referring Physician: Park Liter Treating Physician/Extender: Frann Rider in Treatment: 5 Vital Signs Time Taken: 13:34 Temperature (F): 98.2 Height (in): 67 Pulse (bpm): 68 Weight (lbs): 184 Respiratory Rate (breaths/min): 16 Body Mass Index (BMI): 28.8 Blood Pressure (mmHg): 158/91 Reference Range: 80 - 120 mg / dl Electronic Signature(s) Signed: 09/13/2016 4:48:58 PM By: Gretta Cool, RN, BSN, Kim RN, BSN Entered By: Gretta Cool, RN, BSN, Kim on 09/13/2016 13:34:32

## 2016-09-20 ENCOUNTER — Encounter: Payer: Medicare PPO | Admitting: Surgery

## 2016-09-20 DIAGNOSIS — I87312 Chronic venous hypertension (idiopathic) with ulcer of left lower extremity: Secondary | ICD-10-CM | POA: Diagnosis not present

## 2016-09-22 NOTE — Progress Notes (Signed)
MYNA, VAUGHN ([id number]) Visit Report for 09/20/2016 Arrival Information Details Patient Name: [name], [name]. Date of Service: 09/20/2016 1:30 PM Medical Record Number: [id number] Patient Account Number: [id number] Date of Birth/Sex: 07-Mar-1941 (75 y.o. Female) Treating RN: Carolyne Fiscal, Debi Primary Care Physician: Park Liter Other Clinician: Referring Physician: Park Liter Treating Physician/Extender: Frann Rider in Treatment: 6 Visit Information History Since Last Visit All ordered tests and consults were completed: No Patient Arrived: Ambulatory Added or deleted any medications: No Arrival Time: 13:38 Any new allergies or adverse reactions: No Accompanied By: self Had a fall or experienced change in No Transfer Assistance: None activities of daily living that may affect Patient Identification Verified: Yes risk of falls: Secondary Verification Process Yes Signs or symptoms of abuse/neglect since last No Completed: visito Patient Requires Transmission- No Hospitalized since last visit: No Based Precautions: Pain Present Now: No Patient Has Alerts: Yes Patient Alerts: Patient on Blood Thinner xarelto Electronic Signature(s) Signed: 09/20/2016 4:21:18 PM By: Alric Quan Entered By: Alric Quan on 09/20/2016 13:39:07 Emigh, [name] ([id number]) -------------------------------------------------------------------------------- Encounter Discharge Information Details Patient Name: [name] Date of Service: 09/20/2016 1:30 PM Medical Record Number: [id number] Patient Account Number: [id number] Date of Birth/Sex: 1941-08-10 (75 y.o. Female) Treating RN: Carolyne Fiscal, Debi Primary Care Physician: Park Liter Other Clinician: Referring Physician: Park Liter Treating Physician/Extender: Frann Rider in Treatment: 6 Encounter Discharge Information Items Discharge Pain Level: 0 Discharge Condition: Stable Ambulatory Status:  Ambulatory Discharge Destination: Home Transportation: Private Auto Accompanied By: self Schedule Follow-up Appointment: Yes Medication Reconciliation completed and provided to Patient/Care Yes Inesha Sow: Provided on Clinical Summary of Care: 09/20/2016 Form Type Recipient Paper Patient MM Electronic Signature(s) Signed: 09/20/2016 2:15:58 PM By: Ruthine Dose Entered By: Ruthine Dose on 09/20/2016 14:15:57 Canevari, [name] ([id number]) -------------------------------------------------------------------------------- Lower Extremity Assessment Details Patient Name: [name] Date of Service: 09/20/2016 1:30 PM Medical Record Number: [id number] Patient Account Number: [id number] Date of Birth/Sex: Nov 17, 1940 (75 y.o. Female) Treating RN: Carolyne Fiscal, Debi Primary Care Physician: Park Liter Other Clinician: Referring Physician: Park Liter Treating Physician/Extender: Frann Rider in Treatment: 6 Edema Assessment Assessed: [Left: No] [Right: No] E[Left: dema] [Right: :] Calf Left: Right: Point of Measurement: 34 cm From Medial Instep 41.6 cm cm Ankle Left: Right: Point of Measurement: 10 cm From Medial Instep 24.2 cm cm Vascular Assessment Pulses: Posterior Tibial Dorsalis Pedis Palpable: [Left:Yes] Extremity colors, hair growth, and conditions: Extremity Color: [Left:Normal] Toe Nail Assessment Left: Right: Thick: No Discolored: No Deformed: No Improper Length and Hygiene: No Electronic Signature(s) Signed: 09/20/2016 4:21:18 PM By: Alric Quan Entered By: Alric Quan on 09/20/2016 14:16:07 Coachman, [name] ([id number]) -------------------------------------------------------------------------------- Multi Wound Chart Details Patient Name: [name] Date of Service: 09/20/2016 1:30 PM Medical Record Number: [id number] Patient Account Number: [id number] Date of Birth/Sex: July 27, 1941 (75 y.o. Female) Treating RN: Ahmed Prima Primary  Care Physician: Park Liter Other Clinician: Referring Physician: Park Liter Treating Physician/Extender: Frann Rider in Treatment: 6 Vital Signs Height(in): 67 Pulse(bpm): 58 Weight(lbs): 184 Blood Pressure 157/64 (mmHg): Body Mass Index(BMI): 29 Temperature(F): 98.3 Respiratory Rate 16 (breaths/min): Photos: [2:No Photos] [N/A:N/A] Wound Location: [2:Left Lower Leg - Lateral, Distal] [N/A:N/A] Wounding Event: [2:Gradually Appeared] [N/A:N/A] Primary Etiology: [2:Venous Leg Ulcer] [N/A:N/A] Comorbid History: [2:Arrhythmia, Hypertension] [N/A:N/A] Date Acquired: [2:06/03/2016] [N/A:N/A] Weeks of Treatment: [2:6] [N/A:N/A] Wound Status: [2:Open] [N/A:N/A] Measurements L x W x D 0.3x0.3x0.1 [N/A:N/A] (cm) Area (cm) : [2:0.071] [N/A:N/A] Volume (cm) : [2:0.007] [N/A:N/A] % Reduction in Area: [2:95.00%] [N/A:N/A] % Reduction in Volume:  95.00% [N/A:N/A] Classification: [2:Full Thickness Without Exposed Support Structures] [N/A:N/A] Exudate Amount: [2:Medium] [N/A:N/A] Exudate Type: [2:Serosanguineous] [N/A:N/A] Exudate Color: [2:red, brown] [N/A:N/A] Wound Margin: [2:Flat and Intact] [N/A:N/A] Granulation Amount: [2:Large (67-100%)] [N/A:N/A] Granulation Quality: [2:Red, Pink] [N/A:N/A] Necrotic Amount: [2:Small (1-33%)] [N/A:N/A] Exposed Structures: [2:Fascia: No Fat: No Tendon: No Muscle: No Joint: No] [N/A:N/A] Bone: No Limited to Skin Breakdown Epithelialization: Medium (34-66%) N/A N/A Periwound Skin Texture: Edema: Yes N/A N/A Scarring: Yes Excoriation: No Induration: No Callus: No Crepitus: No Fluctuance: No Friable: No Rash: No Periwound Skin Moist: Yes N/A N/A Moisture: Maceration: No Dry/Scaly: No Periwound Skin Color: Ecchymosis: Yes N/A N/A Atrophie Blanche: No Cyanosis: No Erythema: No Hemosiderin Staining: No Mottled: No Pallor: No Rubor: No Tenderness on Yes N/A N/A Palpation: Wound Preparation: Ulcer Cleansing: N/A  N/A Rinsed/Irrigated with Saline, Other: SOAP AND WATER Topical Anesthetic Applied: Other: lidocaine 4% Treatment Notes Electronic Signature(s) Signed: 09/20/2016 4:21:18 PM By: Alric Quan Entered By: Alric Quan on 09/20/2016 13:58:31 Garro, [name] ([id number]) -------------------------------------------------------------------------------- [place] Details Patient Name: [name] Date of Service: 09/20/2016 1:30 PM Medical Record Number: [id number] Patient Account Number: [id number] Date of Birth/Sex: 1941/04/12 (75 y.o. Female) Treating RN: Carolyne Fiscal, Debi Primary Care Physician: Park Liter Other Clinician: Referring Physician: Park Liter Treating Physician/Extender: Frann Rider in Treatment: 6 Active Inactive Abuse / Safety / Falls / Self Care Management Nursing Diagnoses: Impaired physical mobility Potential for falls Goals: Patient will remain injury free Date Initiated: 08/09/2016 Goal Status: Active Interventions: Assess fall risk on admission and as needed Notes: Orientation to the Wound Care Program Nursing Diagnoses: Knowledge deficit related to the wound healing center program Goals: Patient/caregiver will verbalize understanding of the [place] [facility] Date Initiated: 08/09/2016 Goal Status: Active Interventions: Provide education on orientation to the wound center Notes: Wound/Skin Impairment Nursing Diagnoses: Impaired tissue integrity Goals: Patient/caregiver will verbalize understanding of skin care regimen Kubitz, Kaylah A. ([id number]) Date Initiated: 08/09/2016 Goal Status: Active Ulcer/skin breakdown will have a volume reduction of 30% by week 4 Date Initiated: 08/09/2016 Goal Status: Active Ulcer/skin breakdown will have a volume reduction of 50% by week 8 Date Initiated: 08/09/2016 Goal Status: Active Ulcer/skin breakdown will have a volume reduction of 80% by week 12 Date  Initiated: 08/09/2016 Goal Status: Active Ulcer/skin breakdown will heal within 14 weeks Date Initiated: 08/09/2016 Goal Status: Active Interventions: Assess patient/caregiver ability to obtain necessary supplies Assess patient/caregiver ability to perform ulcer/skin care regimen upon admission and as needed Assess ulceration(s) every visit Notes: Electronic Signature(s) Signed: 09/20/2016 4:21:18 PM By: Alric Quan Entered By: Alric Quan on 09/20/2016 13:58:26 Cremeens, [name] ([id number]) -------------------------------------------------------------------------------- Pain Assessment Details Patient Name: [name] Date of Service: 09/20/2016 1:30 PM Medical Record Number: [id number] Patient Account Number: [id number] Date of Birth/Sex: [birth year] (75 y.o. Female) Treating RN: Ahmed Prima Primary Care Physician: Park Liter Other Clinician: Referring Physician: Park Liter Treating Physician/Extender: Frann Rider in Treatment: 6 Active Problems Location of Pain Severity and Description of Pain Patient Has Paino No Site Locations With Dressing Change: No Pain Management and Medication Current Pain Management: Electronic Signature(s) Signed: 09/20/2016 4:21:18 PM By: Alric Quan Entered By: Alric Quan on 09/20/2016 13:39:12 Omalley, [name] ([id number]) -------------------------------------------------------------------------------- Patient/Caregiver Education Details Patient Name: [name] Date of Service: 09/20/2016 1:30 PM Medical Record Number: [id number] Patient Account Number: [id number] Date of Birth/Gender: [birth year] (75 y.o. Female) Treating RN: Carolyne Fiscal, Debi Primary Care Physician: Park Liter Other Clinician: Referring Physician: Park Liter Treating Physician/Extender: Con Memos  Quincy Simmonds in Treatment: 6 Education Assessment Education Provided To: Patient Education Topics Provided Wound/Skin  Impairment: Handouts: Other: do not get wrap wet Methods: Demonstration, Explain/Verbal Responses: State content correctly Electronic Signature(s) Signed: 09/20/2016 4:21:18 PM By: Alric Quan Entered By: Alric Quan on 09/20/2016 14:04:18 Goodbar, [name] ([id number]) -------------------------------------------------------------------------------- Wound Assessment Details Patient Name: [name] Date of Service: 09/20/2016 1:30 PM Medical Record Number: [id number] Patient Account Number: [id number] Date of Birth/Sex: Feb 13, 1941 (75 y.o. Female) Treating RN: Carolyne Fiscal, Debi Primary Care Physician: Park Liter Other Clinician: Referring Physician: Park Liter Treating Physician/Extender: Frann Rider in Treatment: 6 Wound Status Wound Number: 2 Primary Etiology: Venous Leg Ulcer Wound Location: Left Lower Leg - Lateral, Distal Wound Status: Open Wounding Event: Gradually Appeared Comorbid History: Arrhythmia, Hypertension Date Acquired: 06/03/2016 Weeks Of Treatment: 6 Clustered Wound: No Photos Photo Uploaded By: Alric Quan on 09/20/2016 15:11:06 Wound Measurements Length: (cm) 0.3 Width: (cm) 0.3 Depth: (cm) 0.1 Area: (cm) 0.071 Volume: (cm) 0.007 % Reduction in Area: 95% % Reduction in Volume: 95% Epithelialization: Medium (34-66%) Tunneling: No Undermining: No Wound Description Full Thickness Without Exposed Classification: Support Structures Wound Margin: Flat and Intact Exudate Medium Amount: Exudate Type: Serosanguineous Exudate Color: red, brown Foul Odor After Cleansing: No Wound Bed Granulation Amount: Large (67-100%) Exposed Structure Granulation Quality: Red, Pink Fascia Exposed: No Necrotic Amount: Small (1-33%) Fat Layer Exposed: No Mottley, Liora A. ([id number]) Necrotic Quality: Adherent Slough Tendon Exposed: No Muscle Exposed: No Joint Exposed: No Bone Exposed: No Limited to Skin Breakdown Periwound Skin  Texture Texture Color No Abnormalities Noted: No No Abnormalities Noted: No Callus: No Atrophie Blanche: No Crepitus: No Cyanosis: No Excoriation: No Ecchymosis: Yes Fluctuance: No Erythema: No Friable: No Hemosiderin Staining: No Induration: No Mottled: No Localized Edema: Yes Pallor: No Rash: No Rubor: No Scarring: Yes Temperature / Pain Moisture Tenderness on Palpation: Yes No Abnormalities Noted: No Dry / Scaly: No Maceration: No Moist: Yes Wound Preparation Ulcer Cleansing: Rinsed/Irrigated with Saline, Other: SOAP AND WATER, Topical Anesthetic Applied: Other: lidocaine 4%, Treatment Notes Wound #2 (Left, Distal, Lateral Lower Leg) 1. Cleansed with: Clean wound with Normal Saline 2. Anesthetic Topical Lidocaine 4% cream to wound bed prior to debridement 4. Dressing Applied: Foam 7. Secured with Tape 4-Layer Compression System - Left Lower Extremity Electronic Signature(s) Signed: 09/20/2016 4:21:18 PM By: Alric Quan Entered By: Alric Quan on 09/20/2016 13:52:34 Sullenger, [name] ([id number]) -------------------------------------------------------------------------------- Vitals Details Patient Name: [name] Date of Service: 09/20/2016 1:30 PM Medical Record Number: [id number] Patient Account Number: [id number] Date of Birth/Sex: [birth year] (75 y.o. Female) Treating RN: Carolyne Fiscal, Debi Primary Care Physician: Park Liter Other Clinician: Referring Physician: Park Liter Treating Physician/Extender: Frann Rider in Treatment: 6 Vital Signs Time Taken: 13:40 Temperature (F): 98.3 Height (in): 67 Pulse (bpm): 58 Weight (lbs): 184 Respiratory Rate (breaths/min): 16 Body Mass Index (BMI): 28.8 Blood Pressure (mmHg): 157/64 Reference Range: 80 - 120 mg / dl Electronic Signature(s) Signed: 09/20/2016 4:21:18 PM By: Alric Quan Entered By: Alric Quan on 09/20/2016 13:41:29

## 2016-09-22 NOTE — Progress Notes (Addendum)
JOLANTA, VANGORDEN ([id number]) Visit Report for 09/20/2016 Chief Complaint Document Details Patient Name: [name], [name]. Date of Service: 09/20/2016 1:30 PM Medical Record Number: [id number] Patient Account Number: [id number] Date of Birth/Sex: 25-Sep-1941 (74 y.o. Female) Treating RN: Ahmed Prima Primary Care Physician: Park Liter Other Clinician: Referring Physician: Park Liter Treating Physician/Extender: Frann Rider in Treatment: 6 Information Obtained from: Patient Chief Complaint Referral for left lower leg ulcers Electronic Signature(s) Signed: 09/20/2016 2:01:13 PM By: Christin Fudge MD, FACS Entered By: Christin Fudge on 09/20/2016 14:01:13 Lenker, [name] ([id number]) -------------------------------------------------------------------------------- HPI Details Patient Name: [name] Date of Service: 09/20/2016 1:30 PM Medical Record Number: [id number] Patient Account Number: [id number] Date of Birth/Sex: 1941-08-05 (75 y.o. Female) Treating RN: Ahmed Prima Primary Care Physician: Park Liter Other Clinician: Referring Physician: Park Liter Treating Physician/Extender: Frann Rider in Treatment: 6 History of Present Illness Location: Patient presents with wounds to left lower leg. Quality: stinging Severity: mild Duration: > 2 months Timing: Pain in wound is Intermittent (comes and goes Context: The wound appeared gradually over time Modifying Factors: warm compresses, neosporin, PAD, BLE swelling Associated Signs and Symptoms: redness, drainage, pain HPI Description: This is a pleasant 75 y/o elderly female who was referred today for a chronic non healing ulcers to the left lower leg. Gradually appeared approximately 2 months ago. Self treating with warm compresses and neosporin. She was seen by her PCP on 08/06/16 that prompted referral to our clinic for evaluation and on going management. She denies fever, chills, body aches or  malaise. Hx of chronic BLE edema. She denies hx of Diabetes. Positive for HTN and atrial fibrillation. Remote hx of smoking but quit in January of 1980. She denies claudication. She admits to slow healing of wounds. She also notes a resolving macular rash on the RLE. 08/16/16: pt returns today for f/u regarding left lower leg wounds. she has noticed decrease in pain and redness. she reports noted improvement. she denies systemic s/s of infection. I reviewed xray findings dated 08/09/16 that showed no bony changes. soft tissue induration noted secondary to soft tissue injury. no evidence for abscess. I also reviewed wound culture findings that showed no organisms or WBCs. nl skin flora. 08/23/2016 --had a culture done which showed normal skin flora and no anaerobes isolated. X-ray of the left tibia and fibula showed no erosive changes or bony destruction. 09/06/2016 -- venous duplex study has not been scheduled to the middle of December but she does not want to go to [facility] for an earlier appointment. Electronic Signature(s) Signed: 09/20/2016 2:01:16 PM By: Christin Fudge MD, FACS Entered By: Christin Fudge on 09/20/2016 14:01:16 Clapham, [name] ([id number]) -------------------------------------------------------------------------------- Physical Exam Details Patient Name: [name] Date of Service: 09/20/2016 1:30 PM Medical Record Number: [id number] Patient Account Number: [id number] Date of Birth/Sex: 05/06/1941 (75 y.o. Female) Treating RN: Ahmed Prima Primary Care Physician: Park Liter Other Clinician: Referring Physician: Park Liter Treating Physician/Extender: Frann Rider in Treatment: 6 Constitutional . Pulse regular. Respirations normal and unlabored. Afebrile. . Eyes Nonicteric. Reactive to light. Ears, Nose, Mouth, and Throat Lips, teeth, and gums WNL.Marland Kitchen Moist mucosa without lesions. Neck supple and nontender. No palpable supraclavicular or cervical  adenopathy. Normal sized without goiter. Respiratory WNL. No retractions.. Cardiovascular Pedal Pulses WNL. No clubbing, cyanosis or edema. Lymphatic No adneopathy. No adenopathy. No adenopathy. Musculoskeletal Adexa without tenderness or enlargement.. Digits and nails w/o clubbing, cyanosis, infection, petechiae, ischemia, or inflammatory conditions.. Integumentary (Hair, Skin) No suspicious lesions. No crepitus  or fluctuance. No peri-wound warmth or erythema. No masses.Marland Kitchen Psychiatric Judgement and insight Intact.. No evidence of depression, anxiety, or agitation.. Notes the edema has gone down significantly and the wound is looking smaller container no sharp debridement was required today. Electronic Signature(s) Signed: 09/20/2016 2:01:42 PM By: Christin Fudge MD, FACS Entered By: Christin Fudge on 09/20/2016 14:01:42 Sellman, [name] ([id number]) -------------------------------------------------------------------------------- Physician Orders Details Patient Name: [name] Date of Service: 09/20/2016 1:30 PM Medical Record Number: [id number] Patient Account Number: [id number] Date of Birth/Sex: Apr 06, 1941 (75 y.o. Female) Treating RN: Carolyne Fiscal, Debi Primary Care Physician: Park Liter Other Clinician: Referring Physician: Park Liter Treating Physician/Extender: Frann Rider in Treatment: 6 Verbal / Phone Orders: Yes Clinician: Carolyne Fiscal, Debi Read Back and Verified: Yes Diagnosis Coding ICD-10 Coding Code Description L97.829 Non-pressure chronic ulcer of other part of left lower leg with unspecified severity I73.9 Peripheral vascular disease, unspecified I87.312 Chronic venous hypertension (idiopathic) with ulcer of left lower extremity Wound Cleansing Wound #2 Left,Distal,Lateral Lower Leg o Cleanse wound with mild soap and water Skin Barriers/Peri-Wound Care o Moisturizing lotion Primary Wound Dressing Wound #2 Left,Distal,Lateral Lower Leg o  Foam Dressing Change Frequency Wound #2 Left,Distal,Lateral Lower Leg o Other: - Monday after Thanksgiving Follow-up Appointments Wound #2 Left,Distal,Lateral Lower Leg o Other: - Monday after Thanksgiving Edema Control Wound #2 Left,Distal,Lateral Lower Leg o 4-Layer Compression System - Left Lower Extremity o Support Garment 20-30 mm/Hg pressure to: - Purchase and wear on left; bring right to next appointment. Electronic Signature(s) Signed: 09/20/2016 4:17:50 PM By: Christin Fudge MD, FACS Signed: 09/20/2016 4:21:18 PM By: Leafy Ro ([id number]) Entered By: Alric Quan on 09/20/2016 14:03:25 Oubre, [name] ([id number]) -------------------------------------------------------------------------------- Problem List Details Patient Name: [name] Date of Service: 09/20/2016 1:30 PM Medical Record Number: [id number] Patient Account Number: [id number] Date of Birth/Sex: Dec 22, 1940 (75 y.o. Female) Treating RN: Ahmed Prima Primary Care Physician: Park Liter Other Clinician: Referring Physician: Park Liter Treating Physician/Extender: Frann Rider in Treatment: 6 Active Problems ICD-10 Encounter Code Description Active Date Diagnosis L97.829 Non-pressure chronic ulcer of other part of left lower leg 08/09/2016 Yes with unspecified severity I73.9 Peripheral vascular disease, unspecified 08/09/2016 Yes I87.312 Chronic venous hypertension (idiopathic) with ulcer of left 08/09/2016 Yes lower extremity Inactive Problems Resolved Problems Electronic Signature(s) Signed: 09/20/2016 2:01:02 PM By: Christin Fudge MD, FACS Entered By: Christin Fudge on 09/20/2016 14:01:02 Lupi, [name] ([id number]) -------------------------------------------------------------------------------- Progress Note Details Patient Name: [name] Date of Service: 09/20/2016 1:30 PM Medical Record Number: [id number] Patient Account Number:  [id number] Date of Birth/Sex: Mar 11, 1941 (75 y.o. Female) Treating RN: Carolyne Fiscal, Debi Primary Care Physician: Park Liter Other Clinician: Referring Physician: Park Liter Treating Physician/Extender: Frann Rider in Treatment: 6 Subjective Chief Complaint Information obtained from Patient Referral for left lower leg ulcers History of Present Illness (HPI) The following HPI elements were documented for the patient's wound: Location: Patient presents with wounds to left lower leg. Quality: stinging Severity: mild Duration: > 2 months Timing: Pain in wound is Intermittent (comes and goes Context: The wound appeared gradually over time Modifying Factors: warm compresses, neosporin, PAD, BLE swelling Associated Signs and Symptoms: redness, drainage, pain This is a pleasant 75 y/o elderly female who was referred today for a chronic non healing ulcers to the left lower leg. Gradually appeared approximately 2 months ago. Self treating with warm compresses and neosporin. She was seen by her PCP on 08/06/16 that prompted referral to our clinic for evaluation and on going management.  She denies fever, chills, body aches or malaise. Hx of chronic BLE edema. She denies hx of Diabetes. Positive for HTN and atrial fibrillation. Remote hx of smoking but quit in January of 1980. She denies claudication. She admits to slow healing of wounds. She also notes a resolving macular rash on the RLE. 08/16/16: pt returns today for f/u regarding left lower leg wounds. she has noticed decrease in pain and redness. she reports noted improvement. she denies systemic s/s of infection. I reviewed xray findings dated 08/09/16 that showed no bony changes. soft tissue induration noted secondary to soft tissue injury. no evidence for abscess. I also reviewed wound culture findings that showed no organisms or WBCs. nl skin flora. 08/23/2016 --had a culture done which showed normal skin flora and no anaerobes  isolated. X-ray of the left tibia and fibula showed no erosive changes or bony destruction. 09/06/2016 -- venous duplex study has not been scheduled to the middle of December but she does not want to go to [facility] for an earlier appointment. Halleck, Francies A. ([id number]) Objective Constitutional Pulse regular. Respirations normal and unlabored. Afebrile. Vitals Time Taken: 1:40 PM, Height: 67 in, Weight: 184 lbs, BMI: 28.8, Temperature: 98.3 F, Pulse: 58 bpm, Respiratory Rate: 16 breaths/min, Blood Pressure: 157/64 mmHg. Eyes Nonicteric. Reactive to light. Ears, Nose, Mouth, and Throat Lips, teeth, and gums WNL.Marland Kitchen Moist mucosa without lesions. Neck supple and nontender. No palpable supraclavicular or cervical adenopathy. Normal sized without goiter. Respiratory WNL. No retractions.. Cardiovascular Pedal Pulses WNL. No clubbing, cyanosis or edema. Lymphatic No adneopathy. No adenopathy. No adenopathy. Musculoskeletal Adexa without tenderness or enlargement.. Digits and nails w/o clubbing, cyanosis, infection, petechiae, ischemia, or inflammatory conditions.Marland Kitchen Psychiatric Judgement and insight Intact.. No evidence of depression, anxiety, or agitation.. General Notes: the edema has gone down significantly and the wound is looking smaller container no sharp debridement was required today. Integumentary (Hair, Skin) No suspicious lesions. No crepitus or fluctuance. No peri-wound warmth or erythema. No masses.. Wound #2 status is Open. Original cause of wound was Gradually Appeared. The wound is located on the Left,Distal,Lateral Lower Leg. The wound measures 0.3cm length x 0.3cm width x 0.1cm depth; 0.071cm^2 area and 0.007cm^3 volume. The wound is limited to skin breakdown. There is no tunneling or undermining noted. There is a medium amount of serosanguineous drainage noted. The wound margin is flat and intact. There is large (67-100%) red, pink granulation within the wound bed. There  is a small (1-33%) amount of necrotic tissue within the wound bed including Adherent Slough. The periwound skin appearance exhibited: Localized Edema, Scarring, Moist, Ecchymosis. The periwound skin appearance did not exhibit: Callus, Crepitus, Excoriation, Fluctuance, Friable, Induration, Rash, Dry/Scaly, Maceration, Atrophie Blanche, Severa, Tamberly A. ([id number]) Cyanosis, Hemosiderin Staining, Mottled, Pallor, Rubor, Erythema. The periwound has tenderness on palpation. Assessment Active Problems ICD-10 L97.829 - Non-pressure chronic ulcer of other part of left lower leg with unspecified severity I73.9 - Peripheral vascular disease, unspecified I87.312 - Chronic venous hypertension (idiopathic) with ulcer of left lower extremity Plan Wound Cleansing: Wound #2 Left,Distal,Lateral Lower Leg: Cleanse wound with mild soap and water Skin Barriers/Peri-Wound Care: Moisturizing lotion Primary Wound Dressing: Wound #2 Left,Distal,Lateral Lower Leg: Foam Dressing Change Frequency: Wound #2 Left,Distal,Lateral Lower Leg: Other: - Monday after Thanksgiving Follow-up Appointments: Wound #2 Left,Distal,Lateral Lower Leg: Other: - Monday after Thanksgiving Edema Control: Wound #2 Left,Distal,Lateral Lower Leg: 4-Layer Compression System - Left Lower Extremity Support Garment 20-30 mm/Hg pressure to: - Purchase and wear on left; bring right to next  appointment. I have recommended: 1. Foam and a 4 layer Profore wrap Salata, Chanika A. ([id number]) 2. Elevation and exercise 3. regular visits to the wound center. 4. she is also urged to get compression stockings of the 30-40 mm variety and she will get these from [place], as her insurance will not cover this. I had told her this last week but has not brought it in. 5. she will see me Monday after Thanksgiving Electronic Signature(s) Signed: 09/20/2016 4:22:21 PM By: Christin Fudge MD, FACS Previous Signature: 09/20/2016 2:03:05 PM Version By:  Christin Fudge MD, FACS Entered By: Christin Fudge on 09/20/2016 16:22:21 Leas, [name] ([id number]) -------------------------------------------------------------------------------- SuperBill Details Patient Name: [name] Date of Service: 09/20/2016 Medical Record Number: [id number] Patient Account Number: [id number] Date of Birth/Sex: 05/10/1941 (75 y.o. Female) Treating RN: Carolyne Fiscal, Debi Primary Care Physician: Park Liter Other Clinician: Referring Physician: Park Liter Treating Physician/Extender: Frann Rider in Treatment: 6 Diagnosis Coding ICD-10 Codes Code Description [phone] Non-pressure chronic ulcer of other part of left lower leg with unspecified severity I73.9 Peripheral vascular disease, unspecified I87.312 Chronic venous hypertension (idiopathic) with ulcer of left lower extremity Facility Procedures CPT4: Description Modifier Quantity Code [id number] (Facility Use Only) [phone] - [place] [facility] 1 LEG Physician Procedures CPT4: Description Modifier Quantity Code [id number] - WC PHYS LEVEL 3 - EST PT 1 ICD-10 Description Diagnosis L97.829 Non-pressure chronic ulcer of other part of left lower leg with unspecified severity I73.9 Peripheral vascular disease, unspecified  I87.312 Chronic venous hypertension (idiopathic) with ulcer of left lower extremity Electronic Signature(s) Signed: 09/20/2016 4:17:50 PM By: Christin Fudge MD, FACS Signed: 09/20/2016 4:21:18 PM By: Alric Quan Previous Signature: 09/20/2016 2:03:21 PM Version By: Christin Fudge MD, FACS Entered By: Alric Quan on 09/20/2016 14:57:45

## 2016-09-30 ENCOUNTER — Encounter: Payer: Medicare PPO | Admitting: Surgery

## 2016-09-30 DIAGNOSIS — I87312 Chronic venous hypertension (idiopathic) with ulcer of left lower extremity: Secondary | ICD-10-CM | POA: Diagnosis not present

## 2016-10-01 NOTE — Progress Notes (Signed)
YESMIN, BISHOFF ([id number]) Visit Report for 09/30/2016 Arrival Information Details Patient Name: KADISON, NISHIMURA. Date of Service: 09/30/2016 1:30 PM Medical Record Number: [id number] Patient Account Number: [id number] Date of Birth/Sex: [birth year] (75 y.o. Female) Treating RN: Carolyne Fiscal, Debi Primary Care Physician: Park Liter Other Clinician: Referring Physician: Park Liter Treating Physician/Extender: Frann Rider in Treatment: 7 Visit Information History Since Last Visit All ordered tests and consults were completed: No Patient Arrived: Ambulatory Added or deleted any medications: No Arrival Time: 13:24 Any new allergies or adverse reactions: No Accompanied By: self Had a fall or experienced change in No Transfer Assistance: None activities of daily living that may affect Patient Requires Transmission- No risk of falls: Based Precautions: Signs or symptoms of abuse/neglect since last No Patient Has Alerts: Yes visito Patient Alerts: Patient on Blood Hospitalized since last visit: No Thinner Pain Present Now: No xarelto Electronic Signature(s) Signed: 09/30/2016 4:53:13 PM By: Alric Quan Entered By: Alric Quan on 09/30/2016 13:25:38 Grater, [name] ([id number]) -------------------------------------------------------------------------------- Encounter Discharge Information Details Patient Name: [name] Date of Service: 09/30/2016 1:30 PM Medical Record Number: [id number] Patient Account Number: [id number] Date of Birth/Sex: December 04, 1940 (75 y.o. Female) Treating RN: Carolyne Fiscal, Debi Primary Care Physician: Park Liter Other Clinician: Referring Physician: Park Liter Treating Physician/Extender: Frann Rider in Treatment: 7 Encounter Discharge Information Items Discharge Pain Level: 0 Discharge Condition: Stable Ambulatory Status: Ambulatory Discharge Destination: Home Transportation: Private Auto Accompanied By:  self Schedule Follow-up Appointment: Yes Medication Reconciliation completed and provided to Patient/Care Yes Vicke Plotner: Provided on Clinical Summary of Care: 09/30/2016 Form Type Recipient Paper Patient MM Electronic Signature(s) Signed: 09/30/2016 1:59:58 PM By: Ruthine Dose Entered By: Ruthine Dose on 09/30/2016 13:59:58 Kimball, [name] ([id number]) -------------------------------------------------------------------------------- Lower Extremity Assessment Details Patient Name: [name] Date of Service: 09/30/2016 1:30 PM Medical Record Number: [id number] Patient Account Number: [id number] Date of Birth/Sex: 26-Jul-1941 (75 y.o. Female) Treating RN: Carolyne Fiscal, Debi Primary Care Physician: Park Liter Other Clinician: Referring Physician: Park Liter Treating Physician/Extender: Frann Rider in Treatment: 7 Edema Assessment Assessed: [Left: No] [Right: No] E[Left: dema] [Right: :] Calf Left: Right: Point of Measurement: 34 cm From Medial Instep 41.2 cm cm Ankle Left: Right: Point of Measurement: 10 cm From Medial Instep 24 cm cm Vascular Assessment Pulses: Posterior Tibial Dorsalis Pedis Palpable: [Left:Yes] Extremity colors, hair growth, and conditions: Extremity Color: [Left:Normal] Temperature of Extremity: [Left:Warm] Capillary Refill: [Left:< 3 seconds] Toe Nail Assessment Left: Right: Thick: No Discolored: No Deformed: No Improper Length and Hygiene: No Electronic Signature(s) Signed: 09/30/2016 4:53:13 PM By: Alric Quan Entered By: Alric Quan on 09/30/2016 13:32:50 Alyea, [name] ([id number]) -------------------------------------------------------------------------------- Multi Wound Chart Details Patient Name: [name] Date of Service: 09/30/2016 1:30 PM Medical Record Number: [id number] Patient Account Number: [id number] Date of Birth/Sex: [birth year] (75 y.o. Female) Treating RN: Ahmed Prima Primary Care Physician:  Park Liter Other Clinician: Referring Physician: Park Liter Treating Physician/Extender: Frann Rider in Treatment: 7 Vital Signs Height(in): 67 Pulse(bpm): 54 Weight(lbs): 184 Blood Pressure 149/63 (mmHg): Body Mass Index(BMI): 29 Temperature(F): 97.8 Respiratory Rate 18 (breaths/min): Photos: [N/A:N/A] Wound Location: Left Lower Leg - Lateral, N/A N/A Distal Wounding Event: Gradually Appeared N/A N/A Primary Etiology: Venous Leg Ulcer N/A N/A Comorbid History: Arrhythmia, Hypertension N/A N/A Date Acquired: 06/03/2016 N/A N/A Weeks of Treatment: 7 N/A N/A Wound Status: Open N/A N/A Measurements L x W x D 0.1x0.1x0.1 N/A N/A (cm) Area (cm) : 0.008 N/A N/A Volume (cm) : 0.001 N/A N/A % Reduction  in Area: 99.40% N/A N/A % Reduction in Volume: 99.30% N/A N/A Classification: Full Thickness Without N/A N/A Exposed Support Structures Exudate Amount: None Present N/A N/A Wound Margin: Flat and Intact N/A N/A Granulation Amount: Large (67-100%) N/A N/A Granulation Quality: Red, Pink N/A N/A Necrotic Amount: None Present (0%) N/A N/A Exposed Structures: N/A N/A Duignan, Kizzy A. ([id number]) Fascia: No Fat: No Tendon: No Muscle: No Joint: No Bone: No Limited to Skin Breakdown Epithelialization: Medium (34-66%) N/A N/A Periwound Skin Texture: Edema: Yes N/A N/A Scarring: Yes Excoriation: No Induration: No Callus: No Crepitus: No Fluctuance: No Friable: No Rash: No Periwound Skin Moist: Yes N/A N/A Moisture: Maceration: No Dry/Scaly: No Periwound Skin Color: Ecchymosis: Yes N/A N/A Atrophie Blanche: No Cyanosis: No Erythema: No Hemosiderin Staining: No Mottled: No Pallor: No Rubor: No Tenderness on Yes N/A N/A Palpation: Wound Preparation: Ulcer Cleansing: N/A N/A Rinsed/Irrigated with Saline, Other: SOAP AND WATER Topical Anesthetic Applied: Other: lidocaine 4% Treatment Notes Electronic Signature(s) Signed: 09/30/2016 4:53:13  PM By: Alric Quan Entered By: Alric Quan on 09/30/2016 13:47:28 Nembhard, [name] ([id number]) -------------------------------------------------------------------------------- Multi-Disciplinary Care Plan Details Patient Name: [name] Date of Service: 09/30/2016 1:30 PM Medical Record Number: [id number] Patient Account Number: [id number] Date of Birth/Sex: April 07, 1941 (75 y.o. Female) Treating RN: Carolyne Fiscal, Debi Primary Care Physician: Park Liter Other Clinician: Referring Physician: Park Liter Treating Physician/Extender: Frann Rider in Treatment: 7 Active Inactive Abuse / Safety / Falls / Self Care Management Nursing Diagnoses: Impaired physical mobility Potential for falls Goals: Patient will remain injury free Date Initiated: 08/09/2016 Goal Status: Active Interventions: Assess fall risk on admission and as needed Notes: Orientation to the Wound Care Program Nursing Diagnoses: Knowledge deficit related to the wound healing center program Goals: Patient/caregiver will verbalize understanding of the [facility] Date Initiated: 08/09/2016 Goal Status: Active Interventions: Provide education on orientation to the wound center Notes: Wound/Skin Impairment Nursing Diagnoses: Impaired tissue integrity Goals: Patient/caregiver will verbalize understanding of skin care regimen Winner, Lennyx A. ([id number]) Date Initiated: 08/09/2016 Goal Status: Active Ulcer/skin breakdown will have a volume reduction of 30% by week 4 Date Initiated: 08/09/2016 Goal Status: Active Ulcer/skin breakdown will have a volume reduction of 50% by week 8 Date Initiated: 08/09/2016 Goal Status: Active Ulcer/skin breakdown will have a volume reduction of 80% by week 12 Date Initiated: 08/09/2016 Goal Status: Active Ulcer/skin breakdown will heal within 14 weeks Date Initiated: 08/09/2016 Goal Status: Active Interventions: Assess patient/caregiver ability  to obtain necessary supplies Assess patient/caregiver ability to perform ulcer/skin care regimen upon admission and as needed Assess ulceration(s) every visit Notes: Electronic Signature(s) Signed: 09/30/2016 4:53:13 PM By: Alric Quan Entered By: Alric Quan on 09/30/2016 13:47:23 Depriest, [name] ([id number]) -------------------------------------------------------------------------------- Pain Assessment Details Patient Name: [name] Date of Service: 09/30/2016 1:30 PM Medical Record Number: [id number] Patient Account Number: [id number] Date of Birth/Sex: 04-Feb-1941 (75 y.o. Female) Treating RN: Ahmed Prima Primary Care Physician: Park Liter Other Clinician: Referring Physician: Park Liter Treating Physician/Extender: Frann Rider in Treatment: 7 Active Problems Location of Pain Severity and Description of Pain Patient Has Paino No Site Locations With Dressing Change: No Pain Management and Medication Current Pain Management: Electronic Signature(s) Signed: 09/30/2016 4:53:13 PM By: Alric Quan Entered By: Alric Quan on 09/30/2016 13:27:10 Malek, [name] ([id number]) -------------------------------------------------------------------------------- Patient/Caregiver Education Details Patient Name: [name] Date of Service: 09/30/2016 1:30 PM Medical Record Number: [id number] Patient Account Number: [id number] Date of Birth/Gender: 1941/03/03 (75 y.o. Female) Treating RN: Carolyne Fiscal, Debi  Primary Care Physician: Park Liter Other Clinician: Referring Physician: Park Liter Treating Physician/Extender: Frann Rider in Treatment: 7 Education Assessment Education Provided To: Patient Education Topics Provided Wound/Skin Impairment: Handouts: Other: do not get wrap wet Methods: Demonstration, Explain/Verbal Responses: State content correctly Electronic Signature(s) Signed: 09/30/2016 4:53:13 PM By: Alric Quan Entered By: Alric Quan on 09/30/2016 13:49:41 Callaham, [name] ([id number]) -------------------------------------------------------------------------------- Wound Assessment Details Patient Name: [name] Date of Service: 09/30/2016 1:30 PM Medical Record Number: [id number] Patient Account Number: [id number] Date of Birth/Sex: 1941/08/01 (75 y.o. Female) Treating RN: Carolyne Fiscal, Debi Primary Care Physician: Park Liter Other Clinician: Referring Physician: Park Liter Treating Physician/Extender: Frann Rider in Treatment: 7 Wound Status Wound Number: 2 Primary Etiology: Venous Leg Ulcer Wound Location: Left Lower Leg - Lateral, Distal Wound Status: Open Wounding Event: Gradually Appeared Comorbid History: Arrhythmia, Hypertension Date Acquired: 06/03/2016 Weeks Of Treatment: 7 Clustered Wound: No Photos Photo Uploaded By: Alric Quan on 09/30/2016 13:40:16 Wound Measurements Length: (cm) 0.1 Width: (cm) 0.1 Depth: (cm) 0.1 Area: (cm) 0.008 Volume: (cm) 0.001 % Reduction in Area: 99.4% % Reduction in Volume: 99.3% Epithelialization: Medium (34-66%) Tunneling: No Undermining: No Wound Description Full Thickness Without Exposed Classification: Support Structures Wound Margin: Flat and Intact Exudate None Present Amount: Foul Odor After Cleansing: No Wound Bed Granulation Amount: Large (67-100%) Exposed Structure Granulation Quality: Red, Pink Fascia Exposed: No Necrotic Amount: None Present (0%) Fat Layer Exposed: No Tendon Exposed: No Muscle Exposed: No Abel, Ainsley A. ([id number]) Joint Exposed: No Bone Exposed: No Limited to Skin Breakdown Periwound Skin Texture Texture Color No Abnormalities Noted: No No Abnormalities Noted: No Callus: No Atrophie Blanche: No Crepitus: No Cyanosis: No Excoriation: No Ecchymosis: Yes Fluctuance: No Erythema: No Friable: No Hemosiderin Staining: No Induration: No Mottled:  No Localized Edema: Yes Pallor: No Rash: No Rubor: No Scarring: Yes Temperature / Pain Moisture Tenderness on Palpation: Yes No Abnormalities Noted: No Dry / Scaly: No Maceration: No Moist: Yes Wound Preparation Ulcer Cleansing: Rinsed/Irrigated with Saline, Other: SOAP AND WATER, Topical Anesthetic Applied: Other: lidocaine 4%, Treatment Notes Wound #2 (Left, Distal, Lateral Lower Leg) 1. Cleansed with: Clean wound with Normal Saline Cleanse wound with antibacterial soap and water 2. Anesthetic Topical Lidocaine 4% cream to wound bed prior to debridement 4. Dressing Applied: Foam 7. Secured with Tape 4-Layer Compression System - Left Lower Extremity Notes unna to anchor Electronic Signature(s) Signed: 09/30/2016 4:53:13 PM By: Alric Quan Entered By: Alric Quan on 09/30/2016 13:39:52 Dossantos, [name] ([id number]) -------------------------------------------------------------------------------- Vitals Details Patient Name: [name] Date of Service: 09/30/2016 1:30 PM Medical Record Number: [id number] Patient Account Number: [id number] Date of Birth/Sex: June 07, 1941 (75 y.o. Female) Treating RN: Carolyne Fiscal, Debi Primary Care Physician: Park Liter Other Clinician: Referring Physician: Park Liter Treating Physician/Extender: Frann Rider in Treatment: 7 Vital Signs Time Taken: 13:27 Temperature (F): 97.8 Height (in): 67 Pulse (bpm): 54 Weight (lbs): 184 Respiratory Rate (breaths/min): 18 Body Mass Index (BMI): 28.8 Blood Pressure (mmHg): 149/63 Reference Range: 80 - 120 mg / dl Electronic Signature(s) Signed: 09/30/2016 4:53:13 PM By: Alric Quan Entered By: Alric Quan on 09/30/2016 13:27:30

## 2016-10-01 NOTE — Progress Notes (Signed)
[name], [name] ([id number]) Visit Report for 09/30/2016 Chief Complaint Document Details Patient Name: [name], [name]. Date of Service: 09/30/2016 1:30 PM Medical Record Number: [id number] Patient Account Number: [id number] Date of Birth/Sex: [birth year] (75 y.o. Female) Treating RN: Ahmed Prima Primary Care Physician: Park Liter Other Clinician: Referring Physician: Park Liter Treating Physician/Extender: Frann Rider in Treatment: 7 Information Obtained from: Patient Chief Complaint Referral for left lower leg ulcers Electronic Signature(s) Signed: 09/30/2016 1:49:23 PM By: Christin Fudge MD, FACS Entered By: Christin Fudge on 09/30/2016 13:49:23 Teas, [name] ([id number]) -------------------------------------------------------------------------------- HPI Details Patient Name: [name] Date of Service: 09/30/2016 1:30 PM Medical Record Number: [id number] Patient Account Number: [id number] Date of Birth/Sex: January 17, 1941 (75 y.o. Female) Treating RN: Ahmed Prima Primary Care Physician: Park Liter Other Clinician: Referring Physician: Park Liter Treating Physician/Extender: Frann Rider in Treatment: 7 History of Present Illness Location: Patient presents with wounds to left lower leg. Quality: stinging Severity: mild Duration: > 2 months Timing: Pain in wound is Intermittent (comes and goes Context: The wound appeared gradually over time Modifying Factors: warm compresses, neosporin, PAD, BLE swelling Associated Signs and Symptoms: redness, drainage, pain HPI Description: This is a pleasant 75 y/o elderly female who was referred today for a chronic non healing ulcers to the left lower leg. Gradually appeared approximately 2 months ago. Self treating with warm compresses and neosporin. She was seen by her PCP on 08/06/16 that prompted referral to our clinic for evaluation and on going management. She denies fever, chills, body aches or  malaise. Hx of chronic BLE edema. She denies hx of Diabetes. Positive for HTN and atrial fibrillation. Remote hx of smoking but quit in January of 1980. She denies claudication. She admits to slow healing of wounds. She also notes a resolving macular rash on the RLE. 08/16/16: pt returns today for f/u regarding left lower leg wounds. she has noticed decrease in pain and redness. she reports noted improvement. she denies systemic s/s of infection. I reviewed xray findings dated 08/09/16 that showed no bony changes. soft tissue induration noted secondary to soft tissue injury. no evidence for abscess. I also reviewed wound culture findings that showed no organisms or WBCs. nl skin flora. 08/23/2016 --had a culture done which showed normal skin flora and no anaerobes isolated. X-ray of the left tibia and fibula showed no erosive changes or bony destruction. 09/06/2016 -- venous duplex study has not been scheduled to the middle of December but she does not want to go to [facility] for an earlier appointment. Electronic Signature(s) Signed: 09/30/2016 1:49:28 PM By: Christin Fudge MD, FACS Entered By: Christin Fudge on 09/30/2016 13:49:28 [name], [name] ([id number]) -------------------------------------------------------------------------------- Physical Exam Details Patient Name: [name] Date of Service: 09/30/2016 1:30 PM Medical Record Number: [id number] Patient Account Number: [id number] Date of Birth/Sex: November 07, 1940 (75 y.o. Female) Treating RN: Ahmed Prima Primary Care Physician: Park Liter Other Clinician: Referring Physician: Park Liter Treating Physician/Extender: Frann Rider in Treatment: 7 Constitutional . Pulse regular. Respirations normal and unlabored. Afebrile. . Eyes Nonicteric. Reactive to light. Ears, Nose, Mouth, and Throat Lips, teeth, and gums WNL.Marland Kitchen Moist mucosa without lesions. Neck supple and nontender. No palpable supraclavicular or cervical  adenopathy. Normal sized without goiter. Respiratory WNL. No retractions.. Cardiovascular Heart rhythm and rate regular, no murmur or gallop.. Pedal Pulses WNL. No clubbing, cyanosis or edema. Lymphatic No adneopathy. No adenopathy. No adenopathy. Musculoskeletal Adexa without tenderness or enlargement.. Digits and nails w/o clubbing, cyanosis, infection, petechiae, ischemia, or inflammatory  conditions.. Integumentary (Hair, Skin) No suspicious lesions. No crepitus or fluctuance. No peri-wound warmth or erythema. No masses.Marland Kitchen Psychiatric Judgement and insight Intact.. No evidence of depression, anxiety, or agitation.. Notes the edema is down and the wound is looking excellent but a few microperforations still exist and she has not got her compression stockings yet. Electronic Signature(s) Signed: 09/30/2016 1:50:04 PM By: Christin Fudge MD, FACS Entered By: Christin Fudge on 09/30/2016 13:50:03 [name], [name] ([id number]) -------------------------------------------------------------------------------- Physician Orders Details Patient Name: [name] Date of Service: 09/30/2016 1:30 PM Medical Record Number: [id number] Patient Account Number: [id number] Date of Birth/Sex: 01/28/1941 (75 y.o. Female) Treating RN: Carolyne Fiscal, Debi Primary Care Physician: Park Liter Other Clinician: Referring Physician: Park Liter Treating Physician/Extender: Frann Rider in Treatment: 7 Verbal / Phone Orders: Yes Clinician: Carolyne Fiscal, Debi Read Back and Verified: Yes Diagnosis Coding Wound Cleansing Wound #2 Left,Distal,Lateral Lower Leg o Cleanse wound with mild soap and water Skin Barriers/Peri-Wound Care o Moisturizing lotion Primary Wound Dressing Wound #2 Left,Distal,Lateral Lower Leg o Foam Dressing Change Frequency Wound #2 Left,Distal,Lateral Lower Leg o Change dressing every week Follow-up Appointments Wound #2 Left,Distal,Lateral Lower Leg o Return  Appointment in 1 week. Edema Control Wound #2 Left,Distal,Lateral Lower Leg o 4-Layer Compression System - Left Lower Extremity - unna to anchor o Support Garment 20-30 mm/Hg pressure to: - Purchase and wear on left; bring right to next appointment. Electronic Signature(s) Signed: 09/30/2016 4:38:31 PM By: Christin Fudge MD, FACS Signed: 09/30/2016 4:53:13 PM By: Alric Quan Entered By: Alric Quan on 09/30/2016 13:48:42 Bevans, [name] ([id number]) -------------------------------------------------------------------------------- Problem List Details Patient Name: [name] Date of Service: 09/30/2016 1:30 PM Medical Record Number: [id number] Patient Account Number: [id number] Date of Birth/Sex: 1941/04/08 (75 y.o. Female) Treating RN: Ahmed Prima Primary Care Physician: Park Liter Other Clinician: Referring Physician: Park Liter Treating Physician/Extender: Frann Rider in Treatment: 7 Active Problems ICD-10 Encounter Code Description Active Date Diagnosis L97.829 Non-pressure chronic ulcer of other part of left lower leg 08/09/2016 Yes with unspecified severity I73.9 Peripheral vascular disease, unspecified 08/09/2016 Yes I87.312 Chronic venous hypertension (idiopathic) with ulcer of left 08/09/2016 Yes lower extremity Inactive Problems Resolved Problems Electronic Signature(s) Signed: 09/30/2016 1:49:18 PM By: Christin Fudge MD, FACS Entered By: Christin Fudge on 09/30/2016 13:49:18 Gaige, [name] ([id number]) -------------------------------------------------------------------------------- Progress Note Details Patient Name: [name] Date of Service: 09/30/2016 1:30 PM Medical Record Number: [id number] Patient Account Number: [id number] Date of Birth/Sex: 07-18-1941 (75 y.o. Female) Treating RN: Carolyne Fiscal, Debi Primary Care Physician: Park Liter Other Clinician: Referring Physician: Park Liter Treating Physician/Extender: Frann Rider in Treatment: 7 Subjective Chief Complaint Information obtained from Patient Referral for left lower leg ulcers History of Present Illness (HPI) The following HPI elements were documented for the patient's wound: Location: Patient presents with wounds to left lower leg. Quality: stinging Severity: mild Duration: > 2 months Timing: Pain in wound is Intermittent (comes and goes Context: The wound appeared gradually over time Modifying Factors: warm compresses, neosporin, PAD, BLE swelling Associated Signs and Symptoms: redness, drainage, pain This is a pleasant 75 y/o elderly female who was referred today for a chronic non healing ulcers to the left lower leg. Gradually appeared approximately 2 months ago. Self treating with warm compresses and neosporin. She was seen by her PCP on 08/06/16 that prompted referral to our clinic for evaluation and on going management. She denies fever, chills, body aches or malaise. Hx of chronic BLE edema. She denies hx of Diabetes. Positive for  HTN and atrial fibrillation. Remote hx of smoking but quit in January of 1980. She denies claudication. She admits to slow healing of wounds. She also notes a resolving macular rash on the RLE. 08/16/16: pt returns today for f/u regarding left lower leg wounds. she has noticed decrease in pain and redness. she reports noted improvement. she denies systemic s/s of infection. I reviewed xray findings dated 08/09/16 that showed no bony changes. soft tissue induration noted secondary to soft tissue injury. no evidence for abscess. I also reviewed wound culture findings that showed no organisms or WBCs. nl skin flora. 08/23/2016 --had a culture done which showed normal skin flora and no anaerobes isolated. X-ray of the left tibia and fibula showed no erosive changes or bony destruction. 09/06/2016 -- venous duplex study has not been scheduled to the middle of December but she does not want to go to [facility]  for an earlier appointment. [name], Lisa A. ([id number]) Objective Constitutional Pulse regular. Respirations normal and unlabored. Afebrile. Vitals Time Taken: 1:27 PM, Height: 67 in, Weight: 184 lbs, BMI: 28.8, Temperature: 97.8 F, Pulse: 54 bpm, Respiratory Rate: 18 breaths/min, Blood Pressure: 149/63 mmHg. Eyes Nonicteric. Reactive to light. Ears, Nose, Mouth, and Throat Lips, teeth, and gums WNL.Marland Kitchen Moist mucosa without lesions. Neck supple and nontender. No palpable supraclavicular or cervical adenopathy. Normal sized without goiter. Respiratory WNL. No retractions.. Cardiovascular Heart rhythm and rate regular, no murmur or gallop.. Pedal Pulses WNL. No clubbing, cyanosis or edema. Lymphatic No adneopathy. No adenopathy. No adenopathy. Musculoskeletal Adexa without tenderness or enlargement.. Digits and nails w/o clubbing, cyanosis, infection, petechiae, ischemia, or inflammatory conditions.Marland Kitchen Psychiatric Judgement and insight Intact.. No evidence of depression, anxiety, or agitation.. General Notes: the edema is down and the wound is looking excellent but a few microperforations still exist and she has not got her compression stockings yet. Integumentary (Hair, Skin) No suspicious lesions. No crepitus or fluctuance. No peri-wound warmth or erythema. No masses.. Wound #2 status is Open. Original cause of wound was Gradually Appeared. The wound is located on the Left,Distal,Lateral Lower Leg. The wound measures 0.1cm length x 0.1cm width x 0.1cm depth; 0.008cm^2 area and 0.001cm^3 volume. The wound is limited to skin breakdown. There is no tunneling or undermining noted. There is a none present amount of drainage noted. The wound margin is flat and intact. There is large (67-100%) red, pink granulation within the wound bed. There is no necrotic tissue within the wound bed. The periwound skin appearance exhibited: Localized Edema, Scarring, Moist, Ecchymosis. The periwound skin  appearance did not exhibit: Callus, Crepitus, Excoriation, Fluctuance, Friable, Induration, Rash, Dry/Scaly, Maceration, Atrophie Blanche, Cyanosis, Hemosiderin Staining, Mottled, Pallor, Rubor, Villegas, Chelse A. ([id number]) Erythema. The periwound has tenderness on palpation. Assessment Active Problems ICD-10 L97.829 - Non-pressure chronic ulcer of other part of left lower leg with unspecified severity I73.9 - Peripheral vascular disease, unspecified I87.312 - Chronic venous hypertension (idiopathic) with ulcer of left lower extremity Plan Wound Cleansing: Wound #2 Left,Distal,Lateral Lower Leg: Cleanse wound with mild soap and water Skin Barriers/Peri-Wound Care: Moisturizing lotion Primary Wound Dressing: Wound #2 Left,Distal,Lateral Lower Leg: Foam Dressing Change Frequency: Wound #2 Left,Distal,Lateral Lower Leg: Change dressing every week Follow-up Appointments: Wound #2 Left,Distal,Lateral Lower Leg: Return Appointment in 1 week. Edema Control: Wound #2 Left,Distal,Lateral Lower Leg: 4-Layer Compression System - Left Lower Extremity - unna to anchor Support Garment 20-30 mm/Hg pressure to: - Purchase and wear on left; bring right to next appointment. the wound has resolved very well but she  does not have her stockings for compression yet and in view of this I have recommended: 1. Foam and a 4 layer Profore wrap [name], Lisa A. ([id number]) 2. Elevation and exercise 3. she is also urged to get compression stockings of the 30-40 mm variety and she will get these from [place], as her insurance will not cover this. I had told her this last week but has not brought it in. 4. she will Hopefully be ready for discharge next week Electronic Signature(s) Signed: 09/30/2016 1:51:08 PM By: Christin Fudge MD, FACS Entered By: Christin Fudge on 09/30/2016 13:51:08 [name], [name] ([id number]) -------------------------------------------------------------------------------- SuperBill  Details Patient Name: [name] Date of Service: 09/30/2016 Medical Record Number: [id number] Patient Account Number: [id number] Date of Birth/Sex: 1941-07-16 (75 y.o. Female) Treating RN: Carolyne Fiscal, Debi Primary Care Physician: Park Liter Other Clinician: Referring Physician: Park Liter Treating Physician/Extender: Frann Rider in Treatment: 7 Diagnosis Coding ICD-10 Codes Code Description [phone] Non-pressure chronic ulcer of other part of left lower leg with unspecified severity I73.9 Peripheral vascular disease, unspecified I87.312 Chronic venous hypertension (idiopathic) with ulcer of left lower extremity Facility Procedures CPT4: Description Modifier Quantity Code [id number] (Facility Use Only) [phone] - [facility] LT 1 LEG Physician Procedures CPT4: Description Modifier Quantity Code [id number] - WC PHYS LEVEL 3 - EST PT 1 ICD-10 Description Diagnosis L97.829 Non-pressure chronic ulcer of other part of left lower leg with unspecified severity I73.9 Peripheral vascular disease, unspecified  I87.312 Chronic venous hypertension (idiopathic) with ulcer of left lower extremity Electronic Signature(s) Signed: 09/30/2016 4:38:31 PM By: Christin Fudge MD, FACS Signed: 09/30/2016 4:53:13 PM By: Alric Quan Previous Signature: 09/30/2016 1:51:20 PM Version By: Christin Fudge MD, FACS Entered By: Alric Quan on 09/30/2016 15:29:43

## 2016-10-07 ENCOUNTER — Encounter: Payer: Medicare PPO | Attending: Surgery | Admitting: Surgery

## 2016-10-07 DIAGNOSIS — I739 Peripheral vascular disease, unspecified: Secondary | ICD-10-CM | POA: Diagnosis not present

## 2016-10-07 DIAGNOSIS — I4891 Unspecified atrial fibrillation: Secondary | ICD-10-CM | POA: Insufficient documentation

## 2016-10-07 DIAGNOSIS — Z87891 Personal history of nicotine dependence: Secondary | ICD-10-CM | POA: Insufficient documentation

## 2016-10-07 DIAGNOSIS — I10 Essential (primary) hypertension: Secondary | ICD-10-CM | POA: Insufficient documentation

## 2016-10-07 DIAGNOSIS — I87312 Chronic venous hypertension (idiopathic) with ulcer of left lower extremity: Secondary | ICD-10-CM | POA: Diagnosis not present

## 2016-10-07 DIAGNOSIS — L97829 Non-pressure chronic ulcer of other part of left lower leg with unspecified severity: Secondary | ICD-10-CM | POA: Insufficient documentation

## 2016-10-08 NOTE — Progress Notes (Signed)
NORRENE, WHALLEY ([id number]) Visit Report for 10/07/2016 Chief Complaint Document Details Patient Name: [name], [name]. Date of Service: 10/07/2016 2:15 PM Medical Record Number: [id number] Patient Account Number: [id number] Date of Birth/Sex: May 17, 1941 (75 y.o. Female) Treating RN: Ahmed Prima Primary Care Physician: Park Liter Other Clinician: Referring Physician: Park Liter Treating Physician/Extender: Frann Rider in Treatment: 8 Information Obtained from: Patient Chief Complaint Referral for left lower leg ulcers Electronic Signature(s) Signed: 10/07/2016 2:41:24 PM By: Christin Fudge MD, FACS Entered By: Christin Fudge on 10/07/2016 14:41:23 Biancardi, [name] ([id number]) -------------------------------------------------------------------------------- HPI Details Patient Name: [name] Date of Service: 10/07/2016 2:15 PM Medical Record Number: [id number] Patient Account Number: [id number] Date of Birth/Sex: 05-14-1941 (75 y.o. Female) Treating RN: Ahmed Prima Primary Care Physician: Park Liter Other Clinician: Referring Physician: Park Liter Treating Physician/Extender: Frann Rider in Treatment: 8 History of Present Illness Location: Patient presents with wounds to left lower leg. Quality: stinging Severity: mild Duration: > 2 months Timing: Pain in wound is Intermittent (comes and goes Context: The wound appeared gradually over time Modifying Factors: warm compresses, neosporin, PAD, BLE swelling Associated Signs and Symptoms: redness, drainage, pain HPI Description: This is a pleasant 75 y/o elderly female who was referred today for a chronic non healing ulcers to the left lower leg. Gradually appeared approximately 2 months ago. Self treating with warm compresses and neosporin. She was seen by her PCP on 08/06/16 that prompted referral to our clinic for evaluation and on going management. She denies fever, chills, body aches or malaise.  Hx of chronic BLE edema. She denies hx of Diabetes. Positive for HTN and atrial fibrillation. Remote hx of smoking but quit in January of 1980. She denies claudication. She admits to slow healing of wounds. She also notes a resolving macular rash on the RLE. 08/16/16: pt returns today for f/u regarding left lower leg wounds. she has noticed decrease in pain and redness. she reports noted improvement. she denies systemic s/s of infection. I reviewed xray findings dated 08/09/16 that showed no bony changes. soft tissue induration noted secondary to soft tissue injury. no evidence for abscess. I also reviewed wound culture findings that showed no organisms or WBCs. nl skin flora. 08/23/2016 --had a culture done which showed normal skin flora and no anaerobes isolated. X-ray of the left tibia and fibula showed no erosive changes or bony destruction. 09/06/2016 -- venous duplex study has not been scheduled to the middle of December but she does not want to go to [facility] for an earlier appointment. 10/07/2016 -- the wounds have healed with compression but she still needs to get her venous duplex study done which is in the middle of December Electronic Signature(s) Signed: 10/07/2016 2:41:53 PM By: Christin Fudge MD, FACS Entered By: Christin Fudge on 10/07/2016 14:41:53 Ullman, [name] ([id number]) -------------------------------------------------------------------------------- Physical Exam Details Patient Name: [name] Date of Service: 10/07/2016 2:15 PM Medical Record Number: [id number] Patient Account Number: [id number] Date of Birth/Sex: 10/20/1941 (75 y.o. Female) Treating RN: Ahmed Prima Primary Care Physician: Park Liter Other Clinician: Referring Physician: Park Liter Treating Physician/Extender: Frann Rider in Treatment: 8 Constitutional . Pulse regular. Respirations normal and unlabored. Afebrile. . Eyes Nonicteric. Reactive to light. Ears, Nose, Mouth, and  Throat Lips, teeth, and gums WNL.Marland Kitchen Moist mucosa without lesions. Neck supple and nontender. No palpable supraclavicular or cervical adenopathy. Normal sized without goiter. Respiratory WNL. No retractions.. Breath sounds WNL, No rubs, rales, rhonchi, or wheeze.. Cardiovascular Heart rhythm and rate regular, no  murmur or gallop.. Pedal Pulses WNL. No clubbing, cyanosis or edema. Chest Breasts symmetical and no nipple discharge.. Breast tissue WNL, no masses, lumps, or tenderness.. Lymphatic No adneopathy. No adenopathy. No adenopathy. Musculoskeletal Adexa without tenderness or enlargement.. Digits and nails w/o clubbing, cyanosis, infection, petechiae, ischemia, or inflammatory conditions.. Integumentary (Hair, Skin) No suspicious lesions. No crepitus or fluctuance. No peri-wound warmth or erythema. No masses.Marland Kitchen Psychiatric Judgement and insight Intact.. No evidence of depression, anxiety, or agitation.. Notes her lymphedema is better and the wounds have healed and she does not need any debridement Electronic Signature(s) Signed: 10/07/2016 2:42:15 PM By: Christin Fudge MD, FACS Entered By: Christin Fudge on 10/07/2016 14:42:14 Stimpson, [name] ([id number]) -------------------------------------------------------------------------------- Physician Orders Details Patient Name: [name] Date of Service: 10/07/2016 2:15 PM Medical Record Number: [id number] Patient Account Number: [id number] Date of Birth/Sex: 09-Mar-1941 (75 y.o. Female) Treating RN: Carolyne Fiscal, Debi Primary Care Physician: Park Liter Other Clinician: Referring Physician: Park Liter Treating Physician/Extender: Frann Rider in Treatment: 8 Verbal / Phone Orders: Yes Clinician: Carolyne Fiscal, Debi Read Back and Verified: Yes Diagnosis Coding Edema Control o Support Garment 20-30 mm/Hg pressure to: - Wear your compression hose everyday, all day and remove when you shower and at night when you are  sleeping. Discharge From [facility] Services o Discharge from [place] - Please call our office if you have any questions or concerns. Electronic Signature(s) Signed: 10/07/2016 4:26:33 PM By: Christin Fudge MD, FACS Signed: 10/07/2016 5:33:36 PM By: Alric Quan Entered By: Alric Quan on 10/07/2016 14:34:56 Ruperto, [name] ([id number]) -------------------------------------------------------------------------------- Problem List Details Patient Name: [name] Date of Service: 10/07/2016 2:15 PM Medical Record Number: [id number] Patient Account Number: [id number] Date of Birth/Sex: 1941/07/11 (75 y.o. Female) Treating RN: Ahmed Prima Primary Care Physician: Park Liter Other Clinician: Referring Physician: Park Liter Treating Physician/Extender: Frann Rider in Treatment: 8 Active Problems ICD-10 Encounter Code Description Active Date Diagnosis L97.829 Non-pressure chronic ulcer of other part of left lower leg 08/09/2016 Yes with unspecified severity I73.9 Peripheral vascular disease, unspecified 08/09/2016 Yes I87.312 Chronic venous hypertension (idiopathic) with ulcer of left 08/09/2016 Yes lower extremity Inactive Problems Resolved Problems Electronic Signature(s) Signed: 10/07/2016 2:41:14 PM By: Christin Fudge MD, FACS Entered By: Christin Fudge on 10/07/2016 14:41:14 Raisch, [name] ([id number]) -------------------------------------------------------------------------------- Progress Note Details Patient Name: [name] Date of Service: 10/07/2016 2:15 PM Medical Record Number: [id number] Patient Account Number: [id number] Date of Birth/Sex: 05/11/1941 (75 y.o. Female) Treating RN: Carolyne Fiscal, Debi Primary Care Physician: Park Liter Other Clinician: Referring Physician: Park Liter Treating Physician/Extender: Frann Rider in Treatment: 8 Subjective Chief Complaint Information obtained from Patient Referral for left lower leg  ulcers History of Present Illness (HPI) The following HPI elements were documented for the patient's wound: Location: Patient presents with wounds to left lower leg. Quality: stinging Severity: mild Duration: > 2 months Timing: Pain in wound is Intermittent (comes and goes Context: The wound appeared gradually over time Modifying Factors: warm compresses, neosporin, PAD, BLE swelling Associated Signs and Symptoms: redness, drainage, pain This is a pleasant 75 y/o elderly female who was referred today for a chronic non healing ulcers to the left lower leg. Gradually appeared approximately 2 months ago. Self treating with warm compresses and neosporin. She was seen by her PCP on 08/06/16 that prompted referral to our clinic for evaluation and on going management. She denies fever, chills, body aches or malaise. Hx of chronic BLE edema. She denies hx of Diabetes. Positive for HTN and  atrial fibrillation. Remote hx of smoking but quit in January of 1980. She denies claudication. She admits to slow healing of wounds. She also notes a resolving macular rash on the RLE. 08/16/16: pt returns today for f/u regarding left lower leg wounds. she has noticed decrease in pain and redness. she reports noted improvement. she denies systemic s/s of infection. I reviewed xray findings dated 08/09/16 that showed no bony changes. soft tissue induration noted secondary to soft tissue injury. no evidence for abscess. I also reviewed wound culture findings that showed no organisms or WBCs. nl skin flora. 08/23/2016 --had a culture done which showed normal skin flora and no anaerobes isolated. X-ray of the left tibia and fibula showed no erosive changes or bony destruction. 09/06/2016 -- venous duplex study has not been scheduled to the middle of December but she does not want to go to [facility] for an earlier appointment. 10/07/2016 -- the wounds have healed with compression but she still needs to get her venous  duplex study done which is in the middle of December Scobie, Yanin A. ([id number]) Objective Constitutional Pulse regular. Respirations normal and unlabored. Afebrile. Vitals Time Taken: 2:22 PM, Height: 67 in, Weight: 184 lbs, BMI: 28.8, Temperature: 98.2 F, Pulse: 64 bpm, Respiratory Rate: 18 breaths/min, Blood Pressure: 160/80 mmHg. Eyes Nonicteric. Reactive to light. Ears, Nose, Mouth, and Throat Lips, teeth, and gums WNL.Marland Kitchen Moist mucosa without lesions. Neck supple and nontender. No palpable supraclavicular or cervical adenopathy. Normal sized without goiter. Respiratory WNL. No retractions.. Breath sounds WNL, No rubs, rales, rhonchi, or wheeze.. Cardiovascular Heart rhythm and rate regular, no murmur or gallop.. Pedal Pulses WNL. No clubbing, cyanosis or edema. Chest Breasts symmetical and no nipple discharge.. Breast tissue WNL, no masses, lumps, or tenderness.. Lymphatic No adneopathy. No adenopathy. No adenopathy. Musculoskeletal Adexa without tenderness or enlargement.. Digits and nails w/o clubbing, cyanosis, infection, petechiae, ischemia, or inflammatory conditions.Marland Kitchen Psychiatric Judgement and insight Intact.. No evidence of depression, anxiety, or agitation.. General Notes: her lymphedema is better and the wounds have healed and she does not need any debridement Integumentary (Hair, Skin) No suspicious lesions. No crepitus or fluctuance. No peri-wound warmth or erythema. No masses.. Wound #2 status is Open. Original cause of wound was Gradually Appeared. The wound is located on the Left,Distal,Lateral Lower Leg. The wound measures 0cm length x 0cm width x 0cm depth; 0cm^2 area and 0cm^3 volume. The wound is limited to skin breakdown. There is no tunneling or undermining noted. There Berthelot, Verdis A. ([id number]) is a none present amount of drainage noted. The wound margin is flat and intact. There is no granulation within the wound bed. There is no necrotic tissue within  the wound bed. The periwound skin appearance did not exhibit: Callus, Crepitus, Excoriation, Fluctuance, Friable, Induration, Localized Edema, Rash, Scarring, Dry/Scaly, Maceration, Moist, Atrophie Blanche, Cyanosis, Ecchymosis, Hemosiderin Staining, Mottled, Pallor, Rubor, Erythema. Periwound temperature was noted as No Abnormality. Assessment Active Problems ICD-10 L97.829 - Non-pressure chronic ulcer of other part of left lower leg with unspecified severity I73.9 - Peripheral vascular disease, unspecified I87.312 - Chronic venous hypertension (idiopathic) with ulcer of left lower extremity Plan Edema Control: Support Garment 20-30 mm/Hg pressure to: - Wear your compression hose everyday, all day and remove when you shower and at night when you are sleeping. Discharge From [facility] Services: Discharge from [place] - Please call our office if you have any questions or concerns. the wound has resolved very well and I have recommended: 1. Foam over the  wound with a supple scar and then to use her 20-30 mm compression stockings 2. Elevation and exercise 3. she should also keep her appointment for a venous duplex study and follow-up with the vascular surgeon as needed 4. she will be discharged from the wound care services and see him back as needed Electronic Signature(s) Signed: 10/07/2016 2:43:41 PM By: Christin Fudge MD, FACS Entered By: Christin Fudge on 10/07/2016 14:43:41 Carducci, [name] ([id number]) Prusinski, [name] ([id number]) -------------------------------------------------------------------------------- SuperBill Details Patient Name: [name] Date of Service: 10/07/2016 Medical Record Number: [id number] Patient Account Number: [id number] Date of Birth/Sex: 11/22/1940 (75 y.o. Female) Treating RN: Carolyne Fiscal, Debi Primary Care Physician: Park Liter Other Clinician: Referring Physician: Park Liter Treating Physician/Extender: Frann Rider in Treatment:  8 Diagnosis Coding ICD-10 Codes Code Description [phone] Non-pressure chronic ulcer of other part of left lower leg with unspecified severity I73.9 Peripheral vascular disease, unspecified I87.312 Chronic venous hypertension (idiopathic) with ulcer of left lower extremity Facility Procedures CPT4 Code: [id number] Description: [phone] - WOUND CARE VISIT-LEV 2 EST PT Modifier: Quantity: 1 Physician Procedures CPT4: Description Modifier Quantity Code [id number] - WC PHYS LEVEL 3 - EST PT 1 ICD-10 Description Diagnosis L97.829 Non-pressure chronic ulcer of other part of left lower leg with unspecified severity I73.9 Peripheral vascular disease, unspecified  I87.312 Chronic venous hypertension (idiopathic) with ulcer of left lower extremity Electronic Signature(s) Signed: 10/07/2016 4:26:33 PM By: Christin Fudge MD, FACS Signed: 10/07/2016 5:33:36 PM By: Alric Quan Previous Signature: 10/07/2016 2:43:58 PM Version By: Christin Fudge MD, FACS Entered By: Alric Quan on 10/07/2016 14:51:28

## 2016-10-08 NOTE — Progress Notes (Signed)
QUINETTA, WESTLY ([id number]) Visit Report for 10/07/2016 Arrival Information Details Patient Name: ANISTYNN, NISHIO. Date of Service: 10/07/2016 2:15 PM Medical Record Number: [id number] Patient Account Number: [id number] Date of Birth/Sex: [birth year] (75 y.o. Female) Treating RN: Carolyne Fiscal, Debi Primary Care Physician: Park Liter Other Clinician: Referring Physician: Park Liter Treating Physician/Extender: Frann Rider in Treatment: 8 Visit Information History Since Last Visit All ordered tests and consults were completed: No Patient Arrived: Ambulatory Added or deleted any medications: No Arrival Time: 14:21 Any new allergies or adverse reactions: No Accompanied By: self Had a fall or experienced change in No Transfer Assistance: None activities of daily living that may affect Patient Identification Verified: Yes risk of falls: Secondary Verification Process Yes Signs or symptoms of abuse/neglect since last No Completed: visito Patient Requires Transmission- No Hospitalized since last visit: No Based Precautions: Pain Present Now: No Patient Has Alerts: Yes Patient Alerts: Patient on Blood Thinner xarelto Electronic Signature(s) Signed: 10/07/2016 5:33:36 PM By: Alric Quan Entered By: Alric Quan on 10/07/2016 14:21:51 Storck, [name] ([id number]) -------------------------------------------------------------------------------- Clinic Level of Care Assessment Details Patient Name: [name] Date of Service: 10/07/2016 2:15 PM Medical Record Number: [id number] Patient Account Number: [id number] Date of Birth/Sex: 04-Jan-1941 (75 y.o. Female) Treating RN: Carolyne Fiscal, Debi Primary Care Physician: Park Liter Other Clinician: Referring Physician: Park Liter Treating Physician/Extender: Frann Rider in Treatment: 8 Clinic Level of Care Assessment Items TOOL 4 Quantity Score X - Use when only an EandM is performed on FOLLOW-UP visit 1  0 ASSESSMENTS - Nursing Assessment / Reassessment X - Reassessment of Co-morbidities (includes updates in patient status) 1 10 X - Reassessment of Adherence to Treatment Plan 1 5 ASSESSMENTS - Wound and Skin Assessment / Reassessment X - Simple Wound Assessment / Reassessment - one wound 1 5 []  - Complex Wound Assessment / Reassessment - multiple wounds 0 []  - Dermatologic / Skin Assessment (not related to wound area) 0 ASSESSMENTS - Focused Assessment X - Circumferential Edema Measurements - multi extremities 1 5 []  - Nutritional Assessment / Counseling / Intervention 0 []  - Lower Extremity Assessment (monofilament, tuning fork, pulses) 0 []  - Peripheral Arterial Disease Assessment (using hand held doppler) 0 ASSESSMENTS - Ostomy and/or Continence Assessment and Care []  - Incontinence Assessment and Management 0 []  - Ostomy Care Assessment and Management (repouching, etc.) 0 PROCESS - Coordination of Care X - Simple Patient / Family Education for ongoing care 1 15 []  - Complex (extensive) Patient / Family Education for ongoing care 0 X - Staff obtains Programmer, systems, Records, Test Results / Process Orders 1 10 []  - Staff telephones HHA, Nursing Homes / Clarify orders / etc 0 []  - Routine Transfer to another Facility (non-emergent condition) 0 Mallick, Lailana A. ([id number]) []  - Routine Hospital Admission (non-emergent condition) 0 []  - New Admissions / Biomedical engineer / Ordering NPWT, Apligraf, etc. 0 []  - Emergency Hospital Admission (emergent condition) 0 X - Simple Discharge Coordination 1 10 []  - Complex (extensive) Discharge Coordination 0 PROCESS - Special Needs []  - Pediatric / [place] Patient Management 0 []  - Isolation Patient Management 0 []  - Hearing / Language / Visual special needs 0 []  - Assessment of Community assistance (transportation, D/C planning, etc.) 0 []  - Additional assistance / Altered mentation 0 []  - Support Surface(s) Assessment (bed, cushion, seat, etc.)  0 INTERVENTIONS - Wound Cleansing / Measurement X - Simple Wound Cleansing - one wound 1 5 []  - Complex Wound Cleansing - multiple wounds 0 X - Wound  Imaging (photographs - any number of wounds) 1 5 []  - Wound Tracing (instead of photographs) 0 []  - Simple Wound Measurement - one wound 0 []  - Complex Wound Measurement - multiple wounds 0 INTERVENTIONS - Wound Dressings []  - Small Wound Dressing one or multiple wounds 0 []  - Medium Wound Dressing one or multiple wounds 0 []  - Large Wound Dressing one or multiple wounds 0 []  - Application of Medications - topical 0 []  - Application of Medications - injection 0 INTERVENTIONS - Miscellaneous []  - External ear exam 0 Macadam, Tarah A. ([id number]) []  - Specimen Collection (cultures, biopsies, blood, body fluids, etc.) 0 []  - Specimen(s) / Culture(s) sent or taken to Lab for analysis 0 []  - Patient Transfer (multiple staff / Harrel Lemon Lift / Similar devices) 0 []  - Simple Staple / Suture removal (25 or less) 0 []  - Complex Staple / Suture removal (26 or more) 0 []  - Hypo / Hyperglycemic Management (close monitor of Blood Glucose) 0 []  - Ankle / Brachial Index (ABI) - do not check if billed separately 0 X - Vital Signs 1 5 Has the patient been seen at the hospital within the last three years: Yes Total Score: 75 Level Of Care: New/Established - Level 2 Electronic Signature(s) Signed: 10/07/2016 5:33:36 PM By: Alric Quan Entered By: Alric Quan on 10/07/2016 14:51:17 Golubski, [name] ([id number]) -------------------------------------------------------------------------------- Encounter Discharge Information Details Patient Name: [name] Date of Service: 10/07/2016 2:15 PM Medical Record Number: [id number] Patient Account Number: [id number] Date of Birth/Sex: 21-Aug-1941 (75 y.o. Female) Treating RN: Carolyne Fiscal, Debi Primary Care Physician: Park Liter Other Clinician: Referring Physician: Park Liter Treating  Physician/Extender: Frann Rider in Treatment: 8 Encounter Discharge Information Items Discharge Pain Level: 0 Discharge Condition: Stable Ambulatory Status: Ambulatory Discharge Destination: Home Transportation: Private Auto Accompanied By: self Schedule Follow-up Appointment: No Medication Reconciliation completed and provided to Patient/Care Yes Darthy Manganelli: Provided on Clinical Summary of Care: 10/07/2016 Form Type Recipient Paper Patient MM Electronic Signature(s) Signed: 10/07/2016 2:42:21 PM By: Ruthine Dose Entered By: Ruthine Dose on 10/07/2016 14:42:21 Schembri, [name] ([id number]) -------------------------------------------------------------------------------- Lower Extremity Assessment Details Patient Name: [name] Date of Service: 10/07/2016 2:15 PM Medical Record Number: [id number] Patient Account Number: [id number] Date of Birth/Sex: 15-Oct-1941 (75 y.o. Female) Treating RN: Carolyne Fiscal, Debi Primary Care Physician: Park Liter Other Clinician: Referring Physician: Park Liter Treating Physician/Extender: Frann Rider in Treatment: 8 Edema Assessment Assessed: [Left: No] [Right: No] E[Left: dema] [Right: :] Calf Left: Right: Point of Measurement: 34 cm From Medial Instep 41 cm cm Ankle Left: Right: Point of Measurement: 10 cm From Medial Instep 24.2 cm cm Vascular Assessment Pulses: Posterior Tibial Dorsalis Pedis Palpable: [Left:Yes] Extremity colors, hair growth, and conditions: Extremity Color: [Left:Normal] Capillary Refill: [Left:< 3 seconds] Toe Nail Assessment Left: Right: Thick: No Discolored: No Deformed: No Improper Length and Hygiene: No Electronic Signature(s) Signed: 10/07/2016 5:33:36 PM By: Alric Quan Entered By: Alric Quan on 10/07/2016 14:27:07 Hannis, [name] ([id number]) -------------------------------------------------------------------------------- Multi Wound Chart Details Patient Name: [name] Date of Service: 10/07/2016 2:15 PM Medical Record Number: [id number] Patient Account Number: [id number] Date of Birth/Sex: November 08, 1940 (75 y.o. Female) Treating RN: Ahmed Prima Primary Care Physician: Park Liter Other Clinician: Referring Physician: Park Liter Treating Physician/Extender: Frann Rider in Treatment: 8 Vital Signs Height(in): 67 Pulse(bpm): 64 Weight(lbs): 184 Blood Pressure 160/80 (mmHg): Body Mass Index(BMI): 29 Temperature(F): 98.2 Respiratory Rate 18 (breaths/min): Photos: [2:No Photos] [N/A:N/A] Wound Location: [2:Left Lower Leg - Lateral, Distal] [N/A:N/A]  Wounding Event: [2:Gradually Appeared] [N/A:N/A] Primary Etiology: [2:Venous Leg Ulcer] [N/A:N/A] Comorbid History: [2:Arrhythmia, Hypertension] [N/A:N/A] Date Acquired: [2:06/03/2016] [N/A:N/A] Weeks of Treatment: [2:8] [N/A:N/A] Wound Status: [2:Open] [N/A:N/A] Measurements L x W x D 0x0x0 [N/A:N/A] (cm) Area (cm) : [2:0] [N/A:N/A] Volume (cm) : [2:0] [N/A:N/A] % Reduction in Area: [2:100.00%] [N/A:N/A] % Reduction in Volume: 100.00% [N/A:N/A] Classification: [2:Full Thickness Without Exposed Support Structures] [N/A:N/A] Exudate Amount: [2:None Present] [N/A:N/A] Wound Margin: [2:Flat and Intact] [N/A:N/A] Granulation Amount: [2:None Present (0%)] [N/A:N/A] Necrotic Amount: [2:None Present (0%)] [N/A:N/A] Exposed Structures: [2:Fascia: No Fat: No Tendon: No Muscle: No Joint: No Bone: No Limited to Skin Breakdown] [N/A:N/A] Epithelialization: Large (67-100%) N/A N/A Periwound Skin Texture: Edema: No N/A N/A Excoriation: No Induration: No Callus: No Crepitus: No Fluctuance: No Friable: No Rash: No Scarring: No Periwound Skin Maceration: No N/A N/A Moisture: Moist: No Dry/Scaly: No Periwound Skin Color: Atrophie Blanche: No N/A N/A Cyanosis: No Ecchymosis: No Erythema: No Hemosiderin Staining: No Mottled: No Pallor: No Rubor: No Temperature: No  Abnormality N/A N/A Tenderness on No N/A N/A Palpation: Wound Preparation: Ulcer Cleansing: N/A N/A Rinsed/Irrigated with Saline, Other: SOAP AND WATER Topical Anesthetic Applied: None Treatment Notes Electronic Signature(s) Signed: 10/07/2016 5:33:36 PM By: Alric Quan Entered By: Alric Quan on 10/07/2016 14:33:40 Row, [name] ([id number]) -------------------------------------------------------------------------------- Multi-Disciplinary Care Plan Details Patient Name: [name] Date of Service: 10/07/2016 2:15 PM Medical Record Number: [id number] Patient Account Number: [id number] Date of Birth/Sex: January 31, 1941 (75 y.o. Female) Treating RN: Ahmed Prima Primary Care Physician: Park Liter Other Clinician: Referring Physician: Park Liter Treating Physician/Extender: Frann Rider in Treatment: 8 Active Inactive Electronic Signature(s) Signed: 10/07/2016 5:33:36 PM By: Alric Quan Entered By: Alric Quan on 10/07/2016 14:33:33 Meaney, [name] ([id number]) -------------------------------------------------------------------------------- Pain Assessment Details Patient Name: [name] Date of Service: 10/07/2016 2:15 PM Medical Record Number: [id number] Patient Account Number: [id number] Date of Birth/Sex: September 10, 1941 (75 y.o. Female) Treating RN: Ahmed Prima Primary Care Physician: Park Liter Other Clinician: Referring Physician: Park Liter Treating Physician/Extender: Frann Rider in Treatment: 8 Active Problems Location of Pain Severity and Description of Pain Patient Has Paino No Site Locations With Dressing Change: No Pain Management and Medication Current Pain Management: Electronic Signature(s) Signed: 10/07/2016 5:33:36 PM By: Alric Quan Entered By: Alric Quan on 10/07/2016 14:21:58 Seneca, [name]  ([id number]) -------------------------------------------------------------------------------- Patient/Caregiver Education Details Patient Name: [name] Date of Service: 10/07/2016 2:15 PM Medical Record Number: [id number] Patient Account Number: [id number] Date of Birth/Gender: 10-Aug-1941 (75 y.o. Female) Treating RN: Ahmed Prima Primary Care Physician: Park Liter Other Clinician: Referring Physician: Park Liter Treating Physician/Extender: Frann Rider in Treatment: 8 Education Assessment Education Provided To: Patient Education Topics Provided Wound/Skin Impairment: Handouts: Other: Please call our office if you have any questions or concerns. Methods: Explain/Verbal Responses: State content correctly Electronic Signature(s) Signed: 10/07/2016 5:33:36 PM By: Alric Quan Entered By: Alric Quan on 10/07/2016 14:35:30 Koehl, [name] ([id number]) -------------------------------------------------------------------------------- Wound Assessment Details Patient Name: [name] Date of Service: 10/07/2016 2:15 PM Medical Record Number: [id number] Patient Account Number: [id number] Date of Birth/Sex: 11-13-1940 (75 y.o. Female) Treating RN: Carolyne Fiscal, Debi Primary Care Physician: Park Liter Other Clinician: Referring Physician: Park Liter Treating Physician/Extender: Frann Rider in Treatment: 8 Wound Status Wound Number: 2 Primary Etiology: Venous Leg Ulcer Wound Location: Left Lower Leg - Lateral, Distal Wound Status: Open Wounding Event: Gradually Appeared Comorbid History: Arrhythmia, Hypertension Date Acquired: 06/03/2016 Weeks Of Treatment: 8 Clustered Wound: No Photos Photo Uploaded By: Carolyne Fiscal,  Debra on 10/07/2016 15:01:37 Wound Measurements Length: (cm) 0 % Reductio Width: (cm) 0 % Reductio Depth: (cm) 0 Epithelial Area: (cm) 0 Tunneling Volume: (cm) 0 Undermini n in Area: 100% n in Volume: 100% ization:  Large (67-100%) : No ng: No Wound Description Full Thickness Without Exposed Classification: Support Structures Wound Margin: Flat and Intact Exudate None Present Amount: Foul Odor After Cleansing: No Wound Bed Granulation Amount: None Present (0%) Exposed Structure Necrotic Amount: None Present (0%) Fascia Exposed: No Fat Layer Exposed: No Tendon Exposed: No Muscle Exposed: No Sianez, Reveca A. ([id number]) Joint Exposed: No Bone Exposed: No Limited to Skin Breakdown Periwound Skin Texture Texture Color No Abnormalities Noted: No No Abnormalities Noted: No Callus: No Atrophie Blanche: No Crepitus: No Cyanosis: No Excoriation: No Ecchymosis: No Fluctuance: No Erythema: No Friable: No Hemosiderin Staining: No Induration: No Mottled: No Localized Edema: No Pallor: No Rash: No Rubor: No Scarring: No Temperature / Pain Moisture Temperature: No Abnormality No Abnormalities Noted: No Dry / Scaly: No Maceration: No Moist: No Wound Preparation Ulcer Cleansing: Rinsed/Irrigated with Saline, Other: SOAP AND WATER, Topical Anesthetic Applied: None Electronic Signature(s) Signed: 10/07/2016 5:33:36 PM By: Alric Quan Entered By: Alric Quan on 10/07/2016 14:33:11 Barco, [name] ([id number]) -------------------------------------------------------------------------------- Vitals Details Patient Name: [name] Date of Service: 10/07/2016 2:15 PM Medical Record Number: [id number] Patient Account Number: [id number] Date of Birth/Sex: 1941/07/09 (75 y.o. Female) Treating RN: Carolyne Fiscal, Debi Primary Care Physician: Park Liter Other Clinician: Referring Physician: Park Liter Treating Physician/Extender: Frann Rider in Treatment: 8 Vital Signs Time Taken: 14:22 Temperature (F): 98.2 Height (in): 67 Pulse (bpm): 64 Weight (lbs): 184 Respiratory Rate (breaths/min): 18 Body Mass Index (BMI): 28.8 Blood Pressure (mmHg): 160/80 Reference  Range: 80 - 120 mg / dl Electronic Signature(s) Signed: 10/07/2016 5:33:36 PM By: Alric Quan Entered By: Alric Quan on 10/07/2016 14:22:32

## 2016-10-14 ENCOUNTER — Encounter (INDEPENDENT_AMBULATORY_CARE_PROVIDER_SITE_OTHER): Payer: Self-pay | Admitting: Vascular Surgery

## 2016-10-14 ENCOUNTER — Ambulatory Visit (INDEPENDENT_AMBULATORY_CARE_PROVIDER_SITE_OTHER): Payer: Medicare PPO

## 2016-10-14 ENCOUNTER — Ambulatory Visit (INDEPENDENT_AMBULATORY_CARE_PROVIDER_SITE_OTHER): Payer: Self-pay | Admitting: Vascular Surgery

## 2016-10-14 ENCOUNTER — Encounter (INDEPENDENT_AMBULATORY_CARE_PROVIDER_SITE_OTHER): Payer: Self-pay

## 2016-10-14 ENCOUNTER — Ambulatory Visit (INDEPENDENT_AMBULATORY_CARE_PROVIDER_SITE_OTHER): Payer: Medicare PPO | Admitting: Vascular Surgery

## 2016-10-14 DIAGNOSIS — R609 Edema, unspecified: Secondary | ICD-10-CM

## 2016-10-14 DIAGNOSIS — I872 Venous insufficiency (chronic) (peripheral): Secondary | ICD-10-CM

## 2016-10-14 DIAGNOSIS — I83029 Varicose veins of left lower extremity with ulcer of unspecified site: Secondary | ICD-10-CM | POA: Diagnosis not present

## 2016-10-14 DIAGNOSIS — I83009 Varicose veins of unspecified lower extremity with ulcer of unspecified site: Secondary | ICD-10-CM | POA: Insufficient documentation

## 2016-10-14 DIAGNOSIS — I83023 Varicose veins of left lower extremity with ulcer of ankle: Secondary | ICD-10-CM | POA: Diagnosis not present

## 2016-10-14 DIAGNOSIS — L97929 Non-pressure chronic ulcer of unspecified part of left lower leg with unspecified severity: Secondary | ICD-10-CM

## 2016-10-14 DIAGNOSIS — M79605 Pain in left leg: Secondary | ICD-10-CM | POA: Insufficient documentation

## 2016-10-14 DIAGNOSIS — L97329 Non-pressure chronic ulcer of left ankle with unspecified severity: Principal | ICD-10-CM | POA: Insufficient documentation

## 2016-10-14 DIAGNOSIS — L97909 Non-pressure chronic ulcer of unspecified part of unspecified lower leg with unspecified severity: Secondary | ICD-10-CM

## 2016-10-14 NOTE — Progress Notes (Signed)
MRN : [id number]  Maryna Tout Arnesen is a 75 y.o. (1941/02/17) female who presents with chief complaint of  Chief Complaint  Patient presents with  . New Patient (Initial Visit)  .  History of Present Illness: Patient is seen for evaluation of leg pain and swelling associated with a now healed ulceration. The patient first noticed the swelling remotely. The swelling is associated with pain and discoloration. The pain and swelling worsens with prolonged dependency and improves with elevation. The pain is unrelated to activity.  The patient notes that in the morning the legs are better but the leg symptoms worsened throughout the course of the day. The patient has also noted a progressive worsening of the discoloration in the ankle and shin area.   The patient notes that an ulcer developed acutely without specific trauma and since it occurred it was very slow to heal.    The patient denies claudication symptoms or rest pain symptoms.  The patient denies DJD and LS spine disease.  The patient has not had any past angiography, interventions or vascular surgery.  Elevation makes the leg symptoms better, dependency makes them much worse. The patient denies any recent changes in medications.  The patient has not been wearing graduated compression.  The patient denies a history of DVT or PE. There is no prior history of phlebitis. There is no history of primary lymphedema.  No history of malignancies. No history of trauma or groin or pelvic surgery. There is no history of radiation treatment to the groin or pelvis   [place] ultrasound shows extensive reflux in the GSV bilaterally and not past DVT.  Current Meds  Medication Sig  . losartan (COZAAR) 100 MG tablet Take 1 tablet (100 mg total) by mouth daily.  . metoprolol succinate (TOPROL-XL) 50 MG 24 hr tablet Take 1 tablet (50 mg total) by mouth daily. Take with or immediately following a meal.  . rivaroxaban (XARELTO) 20 MG TABS tablet Take 1  tablet (20 mg total) by mouth daily with supper.    Past Medical History:  Diagnosis Date  . Cat bite of right lower leg [id number]  . Cellulitis of lower leg 12/07/2014  . Hypertension     No past surgical history on file.  Social History Social History  Substance Use Topics  . Smoking status: Former Smoker    Quit date: 11/04/1978  . Smokeless tobacco: Never Used  . Alcohol use No    Family History Family History  Problem Relation Age of Onset  . Hypertension Mother   . Alcohol abuse Father   . Heart disease Father   . Hypertension Father   . Hyperlipidemia Sister   . Hypertension Sister   . Alcohol abuse Brother   . Hypertension Brother   No family history of bleeding/clotting disorders, porphyria or autoimmune disease   Allergies  Allergen Reactions  . Amlodipine     Leg swelling  . Hctz [Hydrochlorothiazide] Nausea Only and Rash     REVIEW OF SYSTEMS (Negative unless checked)  Constitutional: [] Weight loss  [] Fever  [] Chills Cardiac: [] Chest pain   [] Chest pressure   [] Palpitations   [] Shortness of breath when laying flat   [] Shortness of breath with exertion. Vascular:  [] Pain in legs with walking   [] Pain in legs at rest  [] History of DVT   [] Phlebitis   [x] Swelling in legs   [x] Varicose veins   [] Non-healing ulcers Pulmonary:   [] Uses home oxygen   [] Productive cough   [] Hemoptysis   []   Wheeze  [] COPD   [] Asthma Neurologic:  [] Dizziness   [] Seizures   [] History of stroke   [] History of TIA  [] Aphasia   [] Vissual changes   [] Weakness or numbness in arm   [] Weakness or numbness in leg Musculoskeletal:   [] Joint swelling   [] Joint pain   [] Low back pain Hematologic:  [] Easy bruising  [] Easy bleeding   [] Hypercoagulable state   [] Anemic Gastrointestinal:  [] Diarrhea   [] Vomiting  [] Gastroesophageal reflux/heartburn   [] Difficulty swallowing. Genitourinary:  [] Chronic kidney disease   [] Difficult urination  [] Frequent urination   [] Blood in urine Skin:  [x] Rashes    [] Ulcers  Psychological:  [] History of anxiety   []  History of major depression.  Physical Examination  Vitals:   10/14/16 1403  BP: 124/72  Pulse: 61  Resp: 16  Weight: 87.1 kg (192 lb)  Height: 5\' 7"  (1.702 m)   Body mass index is 30.07 kg/m. Gen: WD/WN, NAD Head: [place]/AT, No temporalis wasting.  Ear/Nose/Throat: Hearing grossly intact, nares w/o erythema or drainage, poor dentition Eyes: PER, EOMI, sclera nonicteric.  Neck: Supple, no masses.  No bruit or JVD.  Pulmonary:  Good air movement, clear to auscultation bilaterally, no use of accessory muscles.  Cardiac: RRR, normal S1, S2, no Murmurs. Vascular:  Bilateral large diffuse varicosities with severe venous stasis dermatitis and atrophy blanch with healed ulcer scar Vessel Right Left  Radial Palpable Palpable  Ulnar Palpable Palpable  Brachial Palpable Palpable  Carotid Palpable Palpable  Femoral Palpable Palpable  Popliteal Palpable Palpable  PT Palpable Palpable  DP Palpable Palpable   Gastrointestinal: soft, non-distended. No guarding/no peritoneal signs.  Musculoskeletal: M/S 5/5 throughout.  No deformity or atrophy.  Neurologic: CN 2-12 intact. Pain and light touch intact in extremities.  Symmetrical.  Speech is fluent. Motor exam as listed above. Psychiatric: Judgment intact, Mood & affect appropriate for pt's clinical situation. Dermatologic: No rashes or ulcers noted.  No changes consistent with cellulitis. Lymph : No Cervical lymphadenopathy, no lichenification or skin changes of chronic lymphedema.  CBC Lab Results  Component Value Date   WBC 6.4 08/06/2016   HGB 13.3 11/16/2012   HCT 39.0 08/06/2016   MCV 91 08/06/2016   PLT 284 08/06/2016    BMET    Component Value Date/Time   NA 144 08/06/2016 1523   NA 137 11/16/2012 1747   K 4.6 08/06/2016 1523   K 3.5 11/16/2012 1747   CL 103 08/06/2016 1523   CL 104 11/16/2012 1747   CO2 27 08/06/2016 1523   CO2 26 11/16/2012 1747   GLUCOSE 100 (H)  08/06/2016 1523   GLUCOSE 101 (H) 11/16/2012 1747   BUN 25 08/06/2016 1523   BUN 11 11/16/2012 1747   CREATININE 0.84 08/06/2016 1523   CREATININE 0.55 (L) 11/16/2012 1747   CALCIUM 9.2 08/06/2016 1523   CALCIUM 9.4 11/16/2012 1747   GFRNONAA 68 08/06/2016 1523   GFRNONAA >60 11/16/2012 1747   GFRAA 79 08/06/2016 1523   GFRAA >60 11/16/2012 1747   CrCl cannot be calculated (Patient's most recent lab result is older than the maximum 21 days allowed.).  COAG No results found for: INR, PROTIME  Radiology No results found.  Assessment/Plan 1. Chronic venous insufficiency No surgery or intervention at this point in time.    I have had a long discussion with the patient regarding venous insufficiency and why it  causes symptoms. I have discussed with the patient the chronic skin changes that accompany venous insufficiency and the long term  sequela such as infection and ulceration.  Patient will begin wearing graduated compression stockings class 1 (20-30 mmHg) or compression wraps on a daily basis a prescription was given. The patient will put the stockings on first thing in the morning and removing them in the evening. The patient is instructed specifically not to sleep in the stockings.    In addition, behavioral modification including several periods of elevation of the lower extremities during the day will be continued. I have demonstrated that proper elevation is a position with the ankles at heart level.  The patient is instructed to begin routine exercise, especially walking on a daily basis  Patient's undergo duplex ultrasound of the venous system that reflux is  Present in the bilateral GSV.  Following the review of the ultrasound the patient will follow up in 2-3 months to reassess the degree of swelling and the control that graduated compression stockings or compression wraps  is offering.   The patient can be assessed for a Lymph Pump at that time  2. Venous ulcer of ankle,  left ([place]) Continue wound care as ordered  3. Varicose veins of left lower extremity with ulcer ([place]) See #1  4. Pain in left leg See #1    Hortencia Pilar, MD  10/14/2016 2:45 PM

## 2016-11-25 ENCOUNTER — Encounter: Payer: Self-pay | Admitting: Cardiovascular Disease

## 2016-11-25 ENCOUNTER — Ambulatory Visit (INDEPENDENT_AMBULATORY_CARE_PROVIDER_SITE_OTHER): Payer: Medicare PPO | Admitting: Cardiovascular Disease

## 2016-11-25 VITALS — BP 168/84 | HR 64 | Ht 67.0 in | Wt 191.2 lb

## 2016-11-25 DIAGNOSIS — I10 Essential (primary) hypertension: Secondary | ICD-10-CM | POA: Diagnosis not present

## 2016-11-25 DIAGNOSIS — I482 Chronic atrial fibrillation, unspecified: Secondary | ICD-10-CM

## 2016-11-25 DIAGNOSIS — E6609 Other obesity due to excess calories: Secondary | ICD-10-CM

## 2016-11-25 DIAGNOSIS — L97909 Non-pressure chronic ulcer of unspecified part of unspecified lower leg with unspecified severity: Secondary | ICD-10-CM

## 2016-11-25 DIAGNOSIS — Z6835 Body mass index (BMI) 35.0-35.9, adult: Secondary | ICD-10-CM

## 2016-11-25 DIAGNOSIS — R635 Abnormal weight gain: Secondary | ICD-10-CM

## 2016-11-25 DIAGNOSIS — I872 Venous insufficiency (chronic) (peripheral): Secondary | ICD-10-CM

## 2016-11-25 DIAGNOSIS — I83009 Varicose veins of unspecified lower extremity with ulcer of unspecified site: Secondary | ICD-10-CM

## 2016-11-25 MED ORDER — FUROSEMIDE 20 MG PO TABS: 20.0000 mg | ORAL_TABLET | Freq: Every day | ORAL | 3 refills | Status: DC | PRN

## 2016-11-25 MED ORDER — POTASSIUM CHLORIDE ER 10 MEQ PO TBCR: 10.0000 meq | EXTENDED_RELEASE_TABLET | Freq: Every day | ORAL | 3 refills | Status: DC | PRN

## 2016-11-25 NOTE — Patient Instructions (Addendum)
Medication Instructions:   Please take lasix with potassium 2 to 3 times a week for high blood pressure, shortness of breath  Labwork:  No new labs needed  Testing/Procedures:  No further testing at this time   I recommend watching educational videos on topics of interest to you at:       [email or web address]  Enter code: [email or web address]    Follow-Up: It was a pleasure seeing you in the office today. Please call us if you have new issues that need to be addressed before your next appt.  [phone]  Your physician wants you to follow-up in: 12 months.  You will receive a reminder letter in the mail two months in advance. If you don't receive a letter, please call our office to schedule the follow-up appointment.  If you need a refill on your cardiac medications before your next appointment, please call your pharmacy.

## 2016-11-25 NOTE — Progress Notes (Signed)
Cardiology Office Note  Date:  11/25/2016   ID:  [name], DOB Feb 12, 1941, MRN [id number]  PCP:  Park Liter, DO   Chief Complaint  Patient presents with  . other    85mo f/u. Pt states she is doing well. Reviewed meds with pt verbally.    HPI:  [name] is a 76 year old woman, patient of Dr. Wynetta Emery with history of labile blood pressure who presents for follow-up of her hypertension and chronic atrial fibrillation.   Leg infection, ulcers,venous insuff Went to the vein center, wound center Looking better  No regular exercise program, Tolerating her current medication regiment   blood pressure at home well controlled Continued problems with varicose veins  Weight up 8 pounds in 3 months, eating candy  EKG on today's visit shows atrial fibrillation, rate in the 64, no significant ST or T-wave changes  Other past medical history reviewed Echocardiogram done recently showing severely dilated left atrium, elevated right ventricular systolic pressure, normal ejection fraction.  Remote history of smoking, not for 30 years Husband died several years ago No regular exercise program Reports her blood pressure is labile, sometimes low at home  previous swelling on amlodpine  PMH:   has a past medical history of Cat bite of right lower leg ([id number]); Cellulitis of lower leg (12/07/2014); and Hypertension.  PSH:   History reviewed. No pertinent surgical history.  Current Outpatient Prescriptions  Medication Sig Dispense Refill  . losartan (COZAAR) 100 MG tablet Take 1 tablet (100 mg total) by mouth daily. 90 tablet 4  . metoprolol succinate (TOPROL-XL) 50 MG 24 hr tablet Take 1 tablet (50 mg total) by mouth daily. Take with or immediately following a meal. 90 tablet 4  . rivaroxaban (XARELTO) 20 MG TABS tablet Take 1 tablet (20 mg total) by mouth daily with supper. 30 tablet 11  . furosemide (LASIX) 20 MG tablet Take 1 tablet (20 mg total) by mouth daily as needed. 90  tablet 3  . potassium chloride (K-DUR) 10 MEQ tablet Take 1 tablet (10 mEq total) by mouth daily as needed. 90 tablet 3   No current facility-administered medications for this visit.      Allergies:   Amlodipine and Hctz [hydrochlorothiazide]   Social History:  The patient  reports that she quit smoking about 38 years ago. She has never used smokeless tobacco. She reports that she does not drink alcohol or use drugs.   Family History:   family history includes Alcohol abuse in her brother and father; Heart disease in her father; Hyperlipidemia in her sister; Hypertension in her brother, father, mother, and sister.    Review of Systems: Review of Systems  Constitutional: Negative.   Respiratory: Negative.   Cardiovascular: Negative.   Gastrointestinal: Negative.   Musculoskeletal: Negative.   Neurological: Negative.   Psychiatric/Behavioral: Negative.   All other systems reviewed and are negative.    PHYSICAL EXAM: VS:  BP (!) 168/84 (BP Location: Left Arm, Patient Position: Sitting, Cuff Size: Normal)   Pulse 64   Ht 5\' 7"  (1.702 m)   Wt 191 lb 4 oz (86.8 kg)   LMP  (Approximate)   BMI 29.95 kg/m  , BMI Body mass index is 29.95 kg/m. GEN: Well nourished, well developed, in no acute distress  HEENT: normal  Neck: no JVD, carotid bruits, or masses Cardiac: RRR; no murmurs, rubs, or gallops,no edema  Respiratory:  clear to auscultation bilaterally, normal work of breathing GI: soft, nontender, nondistended, + BS MS: no  deformity or atrophy  Skin: warm and dry, no rash, ulcerations on her lower extremities have healed, compression hose in place Neuro:  Strength and sensation are intact Psych: euthymic mood, full affect    Recent Labs: 08/06/2016: ALT 36; BUN 25; Creatinine, Ser 0.84; Platelets 284; Potassium 4.6; Sodium 144; TSH 0.912    Lipid Panel Lab Results  Component Value Date   CHOL 162 08/06/2016   HDL 70 08/06/2016   LDLCALC 75 08/06/2016   TRIG 83  08/06/2016      Wt Readings from Last 3 Encounters:  11/25/16 191 lb 4 oz (86.8 kg)  10/14/16 192 lb (87.1 kg)  08/06/16 183 lb 14.4 oz (83.4 kg)       ASSESSMENT AND PLAN:  Chronic atrial fibrillation (HCC) - Plan: EKG 12-Lead Tolerating anticoagulation No changes to her medications, rate well controlled on metoprolol  Essential hypertension Pressure elevated on today's visit, recommended she take Lasix with potassium for high blood pressure sparingly 2-3 times per week. Previous echocardiogram showing moderately elevated right heart pressures. Recent issues with leg swelling, ulcerations (predominantly of vein  issue)   Chronic venous insufficiency  she is wearing compression hose, recommended leg elevation  Diuretic sparingly for high blood pressure, shortness of breath   Varicose veins of lower extremity with ulcer, unspecified laterality ([place])  recent seen by Dr.  Ronalee Belts and wound clinic   records reviewed with her Ulcerations have healed. Recommended that she continue to wear her compression hose, leg elevation  Weight gain Poor diet, eating candy, recommended exercise program, dietary changes   Total encounter time more than 25 minutes  Greater than 50% was spent in counseling and coordination of care with the patient  Disposition:   F/U  12 months   Orders Placed This Encounter  Procedures  . EKG 12-Lead     Signed, Esmond Plants, M.D., Ph.D. 11/25/2016  [place], [place]

## 2017-01-13 ENCOUNTER — Ambulatory Visit (INDEPENDENT_AMBULATORY_CARE_PROVIDER_SITE_OTHER): Payer: Medicare PPO | Admitting: Vascular Surgery

## 2017-01-16 ENCOUNTER — Ambulatory Visit (INDEPENDENT_AMBULATORY_CARE_PROVIDER_SITE_OTHER): Payer: Medicare PPO | Admitting: Vascular Surgery

## 2017-01-16 ENCOUNTER — Encounter (INDEPENDENT_AMBULATORY_CARE_PROVIDER_SITE_OTHER): Payer: Self-pay | Admitting: Vascular Surgery

## 2017-01-16 VITALS — BP 121/73 | HR 68 | Resp 16 | Wt 183.0 lb

## 2017-01-16 DIAGNOSIS — L97329 Non-pressure chronic ulcer of left ankle with unspecified severity: Principal | ICD-10-CM

## 2017-01-16 DIAGNOSIS — I10 Essential (primary) hypertension: Secondary | ICD-10-CM

## 2017-01-16 DIAGNOSIS — I83009 Varicose veins of unspecified lower extremity with ulcer of unspecified site: Secondary | ICD-10-CM

## 2017-01-16 DIAGNOSIS — I872 Venous insufficiency (chronic) (peripheral): Secondary | ICD-10-CM | POA: Diagnosis not present

## 2017-01-16 DIAGNOSIS — I83023 Varicose veins of left lower extremity with ulcer of ankle: Secondary | ICD-10-CM | POA: Diagnosis not present

## 2017-01-16 DIAGNOSIS — L97909 Non-pressure chronic ulcer of unspecified part of unspecified lower leg with unspecified severity: Secondary | ICD-10-CM

## 2017-01-16 DIAGNOSIS — M7989 Other specified soft tissue disorders: Secondary | ICD-10-CM | POA: Diagnosis not present

## 2017-01-16 DIAGNOSIS — M79605 Pain in left leg: Secondary | ICD-10-CM

## 2017-01-16 NOTE — Progress Notes (Signed)
MRN : [id number]  [name] is a 76 y.o. (26-Jun-1941) female who presents with chief complaint of  Chief Complaint  Patient presents with  . Follow-up  .  History of Present Illness: The patient returns for followup evaluation 3 months after the initial visit. The patient continues to have pain in the lower extremities with dependency. The pain is lessened with elevation. Graduated compression stockings, Class I (20-30 mmHg), have been worn but the stockings do not eliminate the leg pain. Over-the-counter analgesics do not improve the symptoms. The degree of discomfort continues to interfere with daily activities. The patient notes the pain in the legs is causing problems with daily exercise, at the workplace and even with household activities and maintenance such as standing in the kitchen preparing meals and doing dishes.   Venous ultrasound shows normal deep venous system, no evidence of acute or chronic DVT.  Superficial reflux is present in the great saphenous veins bilaterally  Current Meds  Medication Sig  . furosemide (LASIX) 20 MG tablet Take 1 tablet (20 mg total) by mouth daily as needed.  Marland Kitchen losartan (COZAAR) 100 MG tablet Take 1 tablet (100 mg total) by mouth daily.  . metoprolol succinate (TOPROL-XL) 50 MG 24 hr tablet Take 1 tablet (50 mg total) by mouth daily. Take with or immediately following a meal.  . potassium chloride (K-DUR) 10 MEQ tablet Take 1 tablet (10 mEq total) by mouth daily as needed.  . rivaroxaban (XARELTO) 20 MG TABS tablet Take 1 tablet (20 mg total) by mouth daily with supper.    Past Medical History:  Diagnosis Date  . Cat bite of right lower leg [id number]  . Cellulitis of lower leg 12/07/2014  . Hypertension     No past surgical history on file.  Social History Social History  Substance Use Topics  . Smoking status: Former Smoker    Quit date: 11/04/1978  . Smokeless tobacco: Never Used  . Alcohol use No    Family History Family History    Problem Relation Age of Onset  . Hypertension Mother   . Alcohol abuse Father   . Heart disease Father   . Hypertension Father   . Hyperlipidemia Sister   . Hypertension Sister   . Alcohol abuse Brother   . Hypertension Brother   No family history of bleeding/clotting disorders, porphyria or autoimmune disease   Allergies  Allergen Reactions  . Amlodipine     Leg swelling  . Hctz [Hydrochlorothiazide] Nausea Only and Rash     REVIEW OF SYSTEMS (Negative unless checked)  Constitutional: [] Weight loss  [] Fever  [] Chills Cardiac: [] Chest pain   [] Chest pressure   [] Palpitations   [] Shortness of breath when laying flat   [] Shortness of breath with exertion. Vascular:  [] Pain in legs with walking   [x] Pain in legs with standing  [] History of DVT   [] Phlebitis   [x] Swelling in legs   [x] Varicose veins   [] Non-healing ulcers Pulmonary:   [] Uses home oxygen   [] Productive cough   [] Hemoptysis   [] Wheeze  [] COPD   [] Asthma Neurologic:  [] Dizziness   [] Seizures   [] History of stroke   [] History of TIA  [] Aphasia   [] Vissual changes   [] Weakness or numbness in arm   [] Weakness or numbness in leg Musculoskeletal:   [] Joint swelling   [] Joint pain   [] Low back pain Hematologic:  [] Easy bruising  [] Easy bleeding   [] Hypercoagulable state   [] Anemic Gastrointestinal:  [] Diarrhea   [] Vomiting  []   Gastroesophageal reflux/heartburn   [] Difficulty swallowing. Genitourinary:  [] Chronic kidney disease   [] Difficult urination  [] Frequent urination   [] Blood in urine Skin:  [] Rashes   [] Ulcers  Psychological:  [x] History of anxiety   []  History of major depression.  Physical Examination  Vitals:   01/16/17 0930  BP: 121/73  Pulse: 68  Resp: 16  Weight: 83 kg (183 lb)   Body mass index is 28.66 kg/m. Gen: WD/WN, NAD Head: [place]/AT, No temporalis wasting.  Ear/Nose/Throat: Hearing grossly intact, nares w/o erythema or drainage, poor dentition Eyes: PER, EOMI, sclera nonicteric.  Neck: Supple,  no masses.  No bruit or JVD.  Pulmonary:  Good air movement, clear to auscultation bilaterally, no use of accessory muscles.  Cardiac: RRR, normal S1, S2, no Murmurs. Vascular: 2-3+ edema bilaterally with severe venous changes bilaterally.  Venous ulcers previously noted in the ankle's bilaterally are now healed no evidence of infection Vessel Right Left  Radial Palpable Palpable  Ulnar Palpable Palpable  Brachial Palpable Palpable  Carotid Palpable Palpable  Femoral Palpable Palpable  Popliteal Palpable Palpable  PT 1+ Palpable 1+ Palpable  DP 1+ Palpable 1+ Palpable   Gastrointestinal: soft, non-distended. No guarding/no peritoneal signs.  Musculoskeletal: M/S 5/5 throughout.  No deformity or atrophy.  Neurologic: CN 2-12 intact. Pain and light touch intact in extremities.  Symmetrical.  Speech is fluent. Motor exam as listed above. Psychiatric: Judgment intact, Mood & affect appropriate for pt's clinical situation. Dermatologic: venous stasis dermatitis atrophy blanch ulcers now healed.  No changes consistent with cellulitis. Lymph : No Cervical lymphadenopathy, no lichenification or skin changes of chronic lymphedema.  CBC Lab Results  Component Value Date   WBC 6.4 08/06/2016   HGB 13.3 11/16/2012   HCT 39.0 08/06/2016   MCV 91 08/06/2016   PLT 284 08/06/2016    BMET    Component Value Date/Time   NA 144 08/06/2016 1523   NA 137 11/16/2012 1747   K 4.6 08/06/2016 1523   K 3.5 11/16/2012 1747   CL 103 08/06/2016 1523   CL 104 11/16/2012 1747   CO2 27 08/06/2016 1523   CO2 26 11/16/2012 1747   GLUCOSE 100 (H) 08/06/2016 1523   GLUCOSE 101 (H) 11/16/2012 1747   BUN 25 08/06/2016 1523   BUN 11 11/16/2012 1747   CREATININE 0.84 08/06/2016 1523   CREATININE 0.55 (L) 11/16/2012 1747   CALCIUM 9.2 08/06/2016 1523   CALCIUM 9.4 11/16/2012 1747   GFRNONAA 68 08/06/2016 1523   GFRNONAA >60 11/16/2012 1747   GFRAA 79 08/06/2016 1523   GFRAA >60 11/16/2012 1747   CrCl  cannot be calculated (Patient's most recent lab result is older than the maximum 21 days allowed.).  COAG No results found for: INR, PROTIME  Radiology No results found.   Assessment/Plan 1. Venous ulcer of ankle, left ([place]) Now healed continue compression  2. Leg swelling No surgery or intervention at this point in time.    I have had a long discussion with the patient regarding venous insufficiency and why it  causes symptoms. I have discussed with the patient the chronic skin changes that accompany venous insufficiency and the long term sequela such as infection and ulceration.  Patient will begin wearing graduated compression stockings class 1 (20-30 mmHg) or compression wraps on a daily basis a prescription was given. The patient will put the stockings on first thing in the morning and removing them in the evening. The patient is instructed specifically not to sleep in the  stockings.    In addition, behavioral modification including several periods of elevation of the lower extremities during the day will be continued. I have demonstrated that proper elevation is a position with the ankles at heart level.  The patient is instructed to begin routine exercise, especially walking on a daily basis  Patient's duplex ultrasound of the venous system does not show DVT.  However, bilateral reflux is present in the great saphenous veins bilaterally. I discussed laser ablation of the GSV but given that the ulcers are now healed she does not wish to proceed at this time.  Following the review of the ultrasound the patient will follow up in 2-3 months to reassess the degree of swelling and the control that graduated compression stockings or compression wraps  is offering.   The patient can be assessed for a Lymph Pump at that time  3. Pain in left leg See #2  4. Chronic venous insufficiency See #2  5. Essential hypertension Continue antihypertensive medications as already ordered, these  medications have been reviewed and there are no changes at this time.   6. Varicose veins of lower extremity with ulcer, unspecified laterality ([place]) Continue compression.  Patient does not wish to proceed at this time.    Hortencia Pilar, MD  01/16/2017 9:48 AM

## 2017-02-06 ENCOUNTER — Ambulatory Visit (INDEPENDENT_AMBULATORY_CARE_PROVIDER_SITE_OTHER): Payer: Medicare PPO | Admitting: Family Medicine

## 2017-02-06 ENCOUNTER — Encounter: Payer: Self-pay | Admitting: Family Medicine

## 2017-02-06 VITALS — BP 162/80 | HR 55 | Temp 99.1°F | Resp 17 | Ht 67.0 in | Wt 184.0 lb

## 2017-02-06 DIAGNOSIS — I10 Essential (primary) hypertension: Secondary | ICD-10-CM | POA: Diagnosis not present

## 2017-02-06 NOTE — Assessment & Plan Note (Signed)
BP very labile. Will continue to work on her diet. Continue to monitor. Cut down on chocolate. Continue to monitor.

## 2017-02-06 NOTE — Patient Instructions (Addendum)
DASH Eating Plan DASH stands for "Dietary Approaches to Stop Hypertension." The DASH eating plan is a healthy eating plan that has been shown to reduce high blood pressure (hypertension). It may also reduce your risk for type 2 diabetes, heart disease, and stroke. The DASH eating plan may also help with weight loss. What are tips for following this plan? General guidelines  Avoid eating more than 2,300 mg (milligrams) of salt (sodium) a day. If you have hypertension, you may need to reduce your sodium intake to 1,500 mg a day.  Limit alcohol intake to no more than 1 drink a day for nonpregnant women and 2 drinks a day for men. One drink equals 12 oz of beer, 5 oz of wine, or 1 oz of hard liquor.  Work with your health care provider to maintain a healthy body weight or to lose weight. Ask what an ideal weight is for you.  Get at least 30 minutes of exercise that causes your heart to beat faster (aerobic exercise) most days of the week. Activities may include walking, swimming, or biking.  Work with your health care provider or diet and nutrition specialist (dietitian) to adjust your eating plan to your individual calorie needs. Reading food labels  Check food labels for the amount of sodium per serving. Choose foods with less than 5 percent of the Daily Value of sodium. Generally, foods with less than 300 mg of sodium per serving fit into this eating plan.  To find whole grains, look for the word "whole" as the first word in the ingredient list. Shopping  Buy products labeled as "low-sodium" or "no salt added."  Buy fresh foods. Avoid canned foods and premade or frozen meals. Cooking  Avoid adding salt when cooking. Use salt-free seasonings or herbs instead of table salt or sea salt. Check with your health care provider or pharmacist before using salt substitutes.  Do not fry foods. Cook foods using healthy methods such as baking, boiling, grilling, and broiling instead.  Cook with  heart-healthy oils, such as olive, canola, soybean, or sunflower oil. Meal planning   Eat a balanced diet that includes: ? 5 or more servings of fruits and vegetables each day. At each meal, try to fill half of your plate with fruits and vegetables. ? Up to 6-8 servings of whole grains each day. ? Less than 6 oz of lean meat, poultry, or fish each day. A 3-oz serving of meat is about the same size as a deck of cards. One egg equals 1 oz. ? 2 servings of low-fat dairy each day. ? A serving of nuts, seeds, or beans 5 times each week. ? Heart-healthy fats. Healthy fats called Omega-3 fatty acids are found in foods such as flaxseeds and coldwater fish, like sardines, salmon, and mackerel.  Limit how much you eat of the following: ? Canned or prepackaged foods. ? Food that is high in trans fat, such as fried foods. ? Food that is high in saturated fat, such as fatty meat. ? Sweets, desserts, sugary drinks, and other foods with added sugar. ? Full-fat dairy products.  Do not salt foods before eating.  Try to eat at least 2 vegetarian meals each week.  Eat more home-cooked food and less restaurant, buffet, and fast food.  When eating at a restaurant, ask that your food be prepared with less salt or no salt, if possible. What foods are recommended? The items listed may not be a complete list. Talk with your dietitian about what   dietary choices are best for you. Grains Whole-grain or whole-wheat bread. Whole-grain or whole-wheat pasta. Brown rice. Oatmeal. Quinoa. Bulgur. Whole-grain and low-sodium cereals. Pita bread. Low-fat, low-sodium crackers. Whole-wheat flour tortillas. Vegetables Fresh or frozen vegetables (raw, steamed, roasted, or grilled). Low-sodium or reduced-sodium tomato and vegetable juice. Low-sodium or reduced-sodium tomato sauce and tomato paste. Low-sodium or reduced-sodium canned vegetables. Fruits All fresh, dried, or frozen fruit. Canned fruit in natural juice (without  added sugar). Meat and other protein foods Skinless chicken or turkey. Ground chicken or turkey. Pork with fat trimmed off. Fish and seafood. Egg whites. Dried beans, peas, or lentils. Unsalted nuts, nut butters, and seeds. Unsalted canned beans. Lean cuts of beef with fat trimmed off. Low-sodium, lean deli meat. Dairy Low-fat (1%) or fat-free (skim) milk. Fat-free, low-fat, or reduced-fat cheeses. Nonfat, low-sodium ricotta or cottage cheese. Low-fat or nonfat yogurt. Low-fat, low-sodium cheese. Fats and oils Soft margarine without trans fats. Vegetable oil. Low-fat, reduced-fat, or light mayonnaise and salad dressings (reduced-sodium). Canola, safflower, olive, soybean, and sunflower oils. Avocado. Seasoning and other foods Herbs. Spices. Seasoning mixes without salt. Unsalted popcorn and pretzels. Fat-free sweets. What foods are not recommended? The items listed may not be a complete list. Talk with your dietitian about what dietary choices are best for you. Grains Baked goods made with fat, such as croissants, muffins, or some breads. Dry pasta or rice meal packs. Vegetables Creamed or fried vegetables. Vegetables in a cheese sauce. Regular canned vegetables (not low-sodium or reduced-sodium). Regular canned tomato sauce and paste (not low-sodium or reduced-sodium). Regular tomato and vegetable juice (not low-sodium or reduced-sodium). Pickles. Olives. Fruits Canned fruit in a light or heavy syrup. Fried fruit. Fruit in cream or butter sauce. Meat and other protein foods Fatty cuts of meat. Ribs. Fried meat. Bacon. Sausage. Bologna and other processed lunch meats. Salami. Fatback. Hotdogs. Bratwurst. Salted nuts and seeds. Canned beans with added salt. Canned or smoked fish. Whole eggs or egg yolks. Chicken or turkey with skin. Dairy Whole or 2% milk, cream, and half-and-half. Whole or full-fat cream cheese. Whole-fat or sweetened yogurt. Full-fat cheese. Nondairy creamers. Whipped toppings.  Processed cheese and cheese spreads. Fats and oils Butter. Stick margarine. Lard. Shortening. Ghee. Bacon fat. Tropical oils, such as coconut, palm kernel, or palm oil. Seasoning and other foods Salted popcorn and pretzels. Onion salt, garlic salt, seasoned salt, table salt, and sea salt. Worcestershire sauce. Tartar sauce. Barbecue sauce. Teriyaki sauce. Soy sauce, including reduced-sodium. Steak sauce. Canned and packaged gravies. Fish sauce. Oyster sauce. Cocktail sauce. Horseradish that you find on the shelf. Ketchup. Mustard. Meat flavorings and tenderizers. Bouillon cubes. Hot sauce and Tabasco sauce. Premade or packaged marinades. Premade or packaged taco seasonings. Relishes. Regular salad dressings. Where to find more information:  National Heart, Lung, and Blood Institute: www.nhlbi.nih.gov  American Heart Association: www.heart.org Summary  The DASH eating plan is a healthy eating plan that has been shown to reduce high blood pressure (hypertension). It may also reduce your risk for type 2 diabetes, heart disease, and stroke.  With the DASH eating plan, you should limit salt (sodium) intake to 2,300 mg a day. If you have hypertension, you may need to reduce your sodium intake to 1,500 mg a day.  When on the DASH eating plan, aim to eat more fresh fruits and vegetables, whole grains, lean proteins, low-fat dairy, and heart-healthy fats.  Work with your health care provider or diet and nutrition specialist (dietitian) to adjust your eating plan to your individual   calorie needs. This information is not intended to replace advice given to you by your health care provider. Make sure you discuss any questions you have with your health care provider. Document Released: 10/10/2011 Document Revised: 10/14/2016 Document Reviewed: 10/14/2016 [employer] Interactive Patient Education  2017 [employer]  

## 2017-02-06 NOTE — Progress Notes (Signed)
BP (!) 162/80   Pulse (!) 55   Temp 99.1 F (37.3 C) (Oral)   Resp 17   Ht 5\' 7"  (1.702 m)   Wt 184 lb (83.5 kg)   SpO2 99%   BMI 28.82 kg/m    Subjective:    Patient ID: [name], female    DOB: [birth year], 76 y.o.   MRN: [id number]  HPI: [name] is a 76 y.o. female  Chief Complaint  Patient presents with  . Hypertension   HYPERTENSION Hypertension status: stable  Satisfied with current treatment? yes Duration of hypertension: chronic BP monitoring frequency:  a few times a month BP range: 120s/80s BP medication side effects:  no Medication compliance: excellent compliance Previous BP meds: lisinopril, losartan, hctz, amlodipine, lasix, metoprolol Aspirin: no Recurrent headaches: no Visual changes: no Palpitations: no Dyspnea: no Chest pain: no Lower extremity edema: no Dizzy/lightheaded: no  Relevant past medical, surgical, family and social history reviewed and updated as indicated. Interim medical history since our last visit reviewed. Allergies and medications reviewed and updated.  Review of Systems  Constitutional: Negative.   Respiratory: Negative.   Cardiovascular: Negative.   Psychiatric/Behavioral: Negative.     Per HPI unless specifically indicated above     Objective:    BP (!) 162/80   Pulse (!) 55   Temp 99.1 F (37.3 C) (Oral)   Resp 17   Ht 5\' 7"  (1.702 m)   Wt 184 lb (83.5 kg)   SpO2 99%   BMI 28.82 kg/m   Wt Readings from Last 3 Encounters:  02/06/17 184 lb (83.5 kg)  01/16/17 183 lb (83 kg)  11/25/16 191 lb 4 oz (86.8 kg)    Physical Exam  Constitutional: She is oriented to person, place, and time. She appears well-developed and well-nourished. No distress.  HENT:  Head: Normocephalic and atraumatic.  Right Ear: Hearing normal.  Left Ear: Hearing normal.  Nose: Nose normal.  Eyes: Conjunctivae and lids are normal. Right eye exhibits no discharge. Left eye exhibits no discharge. No scleral icterus.    Cardiovascular: Normal rate, regular rhythm, normal heart sounds and intact distal pulses.  Exam reveals no gallop and no friction rub.   No murmur heard. Pulmonary/Chest: Effort normal and breath sounds normal. No respiratory distress. She has no wheezes. She has no rales. She exhibits no tenderness.  Musculoskeletal: Normal range of motion.  Neurological: She is alert and oriented to person, place, and time.  Skin: Skin is warm, dry and intact. No rash noted. She is not diaphoretic. No erythema. No pallor.  Psychiatric: She has a normal mood and affect. Her speech is normal and behavior is normal. Judgment and thought content normal. Cognition and memory are normal.  Nursing note and vitals reviewed.   Results for orders placed or performed during the hospital encounter of 08/09/16  Aerobic/Anaerobic Culture (surgical/deep wound)  Result Value Ref Range   Specimen Description LL    Special Requests NONE    Gram Stain NO WBC SEEN NO ORGANISMS SEEN     Culture      NORMAL SKIN FLORA NO ANAEROBES ISOLATED Performed at [facility]    Report Status 08/14/2016 FINAL       Assessment & Plan:   Problem List Items Addressed This Visit      Cardiovascular and Mediastinum   HTN (hypertension) - Primary    BP very labile. Will continue to work on her diet. Continue to monitor. Cut down on  chocolate. Continue to monitor.       Relevant Orders   Basic metabolic panel       Follow up plan: Return in about 6 months (around 08/08/2017) for Wellness.

## 2017-02-07 ENCOUNTER — Encounter: Payer: Self-pay | Admitting: Family Medicine

## 2017-02-07 LAB — BASIC METABOLIC PANEL
BUN / CREAT RATIO: 21 (ref 12–28)
BUN: 16 mg/dL (ref 8–27)
CALCIUM: 9.5 mg/dL (ref 8.7–10.3)
CHLORIDE: 99 mmol/L (ref 96–106)
CO2: 26 mmol/L (ref 18–29)
Creatinine, Ser: 0.78 mg/dL (ref 0.57–1.00)
GFR calc Af Amer: 86 mL/min/{1.73_m2} (ref >59)
GFR calc non Af Amer: 75 mL/min/{1.73_m2} (ref >59)
GLUCOSE: 85 mg/dL (ref 65–99)
POTASSIUM: 4.5 mmol/L (ref 3.5–5.2)
Sodium: 139 mmol/L (ref 134–144)

## 2017-02-24 ENCOUNTER — Other Ambulatory Visit: Payer: Self-pay | Admitting: Cardiovascular Disease

## 2017-05-08 ENCOUNTER — Other Ambulatory Visit: Payer: Self-pay | Admitting: Cardiovascular Disease

## 2017-05-19 ENCOUNTER — Other Ambulatory Visit: Payer: Self-pay | Admitting: Cardiovascular Disease

## 2017-06-26 ENCOUNTER — Other Ambulatory Visit: Payer: Self-pay | Admitting: Cardiovascular Disease

## 2017-07-11 ENCOUNTER — Telehealth: Payer: Self-pay | Admitting: Family Medicine

## 2017-07-11 NOTE — Telephone Encounter (Signed)
Called pt to schedule for Annual Wellness Visit with Nurse Health Advisor, Tiffany Hill, my c/b # is [phone]  [name] ° °

## 2017-07-21 ENCOUNTER — Ambulatory Visit (INDEPENDENT_AMBULATORY_CARE_PROVIDER_SITE_OTHER): Payer: Medicare PPO | Admitting: Vascular Surgery

## 2017-08-01 ENCOUNTER — Telehealth: Payer: Self-pay | Admitting: Family Medicine

## 2017-08-01 NOTE — Telephone Encounter (Signed)
Patient scheduled to come in on 08/04/17.

## 2017-08-01 NOTE — Telephone Encounter (Signed)
Please call pt to discuss, she has concerns about pain in her upper right arm that she has been experiencing daily off and on and finds it difficult to lift her arm, the pain is intermittent but occurs almost everyday. C/b home #

## 2017-08-04 ENCOUNTER — Encounter: Payer: Self-pay | Admitting: Family Medicine

## 2017-08-04 ENCOUNTER — Ambulatory Visit (INDEPENDENT_AMBULATORY_CARE_PROVIDER_SITE_OTHER): Payer: Medicare PPO | Admitting: Family Medicine

## 2017-08-04 VITALS — BP 138/80 | HR 67 | Temp 98.8°F | Wt 192.1 lb

## 2017-08-04 DIAGNOSIS — M79602 Pain in left arm: Secondary | ICD-10-CM

## 2017-08-04 MED ORDER — DICLOFENAC SODIUM 1 % TD GEL: 4.0000 g | Freq: Four times a day (QID) | TRANSDERMAL | 12 refills | Status: DC

## 2017-08-04 NOTE — Patient Instructions (Addendum)
Shoulder Exercises Ask your health care provider which exercises are safe for you. Do exercises exactly as told by your health care provider and adjust them as directed. It is normal to feel mild stretching, pulling, tightness, or discomfort as you do these exercises, but you should stop right away if you feel sudden pain or your pain gets worse.Do not begin these exercises until told by your health care provider. RANGE OF MOTION EXERCISES These exercises warm up your muscles and joints and improve the movement and flexibility of your shoulder. These exercises also help to relieve pain, numbness, and tingling. These exercises involve stretching your injured shoulder directly. Exercise A: Pendulum  1. Stand near a wall or a surface that you can hold onto for balance. 2. Bend at the waist and let your left / right arm hang straight down. Use your other arm to support you. Keep your back straight and do not lock your knees. 3. Relax your left / right arm and shoulder muscles, and move your hips and your trunk so your left / right arm swings freely. Your arm should swing because of the motion of your body, not because you are using your arm or shoulder muscles. 4. Keep moving your body so your arm swings in the following directions, as told by your health care provider: ? Side to side. ? Forward and backward. ? In clockwise and counterclockwise circles. 5. Continue each motion for __________ seconds, or for as long as told by your health care provider. 6. Slowly return to the starting position. Repeat __________ times. Complete this exercise __________ times a day. Exercise B:Flexion, Standing  1. Stand and hold a broomstick, a cane, or a similar object. Place your hands a little more than shoulder-width apart on the object. Your left / right hand should be palm-up, and your other hand should be palm-down. 2. Keep your elbow straight and keep your shoulder muscles relaxed. Push the stick down with  your healthy arm to raise your left / right arm in front of your body, and then over your head until you feel a stretch in your shoulder. ? Avoid shrugging your shoulder while you raise your arm. Keep your shoulder blade tucked down toward the middle of your back. 3. Hold for __________ seconds. 4. Slowly return to the starting position. Repeat __________ times. Complete this exercise __________ times a day. Exercise C: Abduction, Standing 1. Stand and hold a broomstick, a cane, or a similar object. Place your hands a little more than shoulder-width apart on the object. Your left / right hand should be palm-up, and your other hand should be palm-down. 2. While keeping your elbow straight and your shoulder muscles relaxed, push the stick across your body toward your left / right side. Raise your left / right arm to the side of your body and then over your head until you feel a stretch in your shoulder. ? Do not raise your arm above shoulder height, unless your health care provider tells you to do that. ? Avoid shrugging your shoulder while you raise your arm. Keep your shoulder blade tucked down toward the middle of your back. 3. Hold for __________ seconds. 4. Slowly return to the starting position. Repeat __________ times. Complete this exercise __________ times a day. Exercise D:Internal Rotation  1. Place your left / right hand behind your back, palm-up. 2. Use your other hand to dangle an exercise band, a towel, or a similar object over your shoulder. Grasp the band with   your left / right hand so you are holding onto both ends. 3. Gently pull up on the band until you feel a stretch in the front of your left / right shoulder. ? Avoid shrugging your shoulder while you raise your arm. Keep your shoulder blade tucked down toward the middle of your back. 4. Hold for __________ seconds. 5. Release the stretch by letting go of the band and lowering your hands. Repeat __________ times. Complete  this exercise __________ times a day. STRETCHING EXERCISES These exercises warm up your muscles and joints and improve the movement and flexibility of your shoulder. These exercises also help to relieve pain, numbness, and tingling. These exercises are done using your healthy shoulder to help stretch the muscles of your injured shoulder. Exercise E: Corner Stretch (External Rotation and Abduction)  1. Stand in a doorway with one of your feet slightly in front of the other. This is called a staggered stance. If you cannot reach your forearms to the door frame, stand facing a corner of a room. 2. Choose one of the following positions as told by your health care provider: ? Place your hands and forearms on the door frame above your head. ? Place your hands and forearms on the door frame at the height of your head. ? Place your hands on the door frame at the height of your elbows. 3. Slowly move your weight onto your front foot until you feel a stretch across your chest and in the front of your shoulders. Keep your head and chest upright and keep your abdominal muscles tight. 4. Hold for __________ seconds. 5. To release the stretch, shift your weight to your back foot. Repeat __________ times. Complete this stretch __________ times a day. Exercise F:Extension, Standing 1. Stand and hold a broomstick, a cane, or a similar object behind your back. ? Your hands should be a little wider than shoulder-width apart. ? Your palms should face away from your back. 2. Keeping your elbows straight and keeping your shoulder muscles relaxed, move the stick away from your body until you feel a stretch in your shoulder. ? Avoid shrugging your shoulders while you move the stick. Keep your shoulder blade tucked down toward the middle of your back. 3. Hold for __________ seconds. 4. Slowly return to the starting position. Repeat __________ times. Complete this exercise __________ times a day. STRENGTHENING  EXERCISES These exercises build strength and endurance in your shoulder. Endurance is the ability to use your muscles for a long time, even after they get tired. Exercise G:External Rotation  1. Sit in a stable chair without armrests. 2. Secure an exercise band at elbow height on your left / right side. 3. Place a soft object, such as a folded towel or a small pillow, between your left / right upper arm and your body to move your elbow a few inches away (about 10 cm) from your side. 4. Hold the end of the band so it is tight and there is no slack. 5. Keeping your elbow pressed against the soft object, move your left / right forearm out, away from your abdomen. Keep your body steady so only your forearm moves. 6. Hold for __________ seconds. 7. Slowly return to the starting position. Repeat __________ times. Complete this exercise __________ times a day. Exercise H:Shoulder Abduction  1. Sit in a stable chair without armrests, or stand. 2. Hold a __________ weight in your left / right hand, or hold an exercise band with both hands.   3. Start with your arms straight down and your left / right palm facing in, toward your body. 4. Slowly lift your left / right hand out to your side. Do not lift your hand above shoulder height unless your health care provider tells you that this is safe. ? Keep your arms straight. ? Avoid shrugging your shoulder while you do this movement. Keep your shoulder blade tucked down toward the middle of your back. 5. Hold for __________ seconds. 6. Slowly lower your arm, and return to the starting position. Repeat __________ times. Complete this exercise __________ times a day. Exercise I:Shoulder Extension 1. Sit in a stable chair without armrests, or stand. 2. Secure an exercise band to a stable object in front of you where it is at shoulder height. 3. Hold one end of the exercise band in each hand. Your palms should face each other. 4. Straighten your elbows and  lift your hands up to shoulder height. 5. Step back, away from the secured end of the exercise band, until the band is tight and there is no slack. 6. Squeeze your shoulder blades together as you pull your hands down to the sides of your thighs. Stop when your hands are straight down by your sides. Do not let your hands go behind your body. 7. Hold for __________ seconds. 8. Slowly return to the starting position. Repeat __________ times. Complete this exercise __________ times a day. Exercise J:Standing Shoulder Row 1. Sit in a stable chair without armrests, or stand. 2. Secure an exercise band to a stable object in front of you so it is at waist height. 3. Hold one end of the exercise band in each hand. Your palms should be in a thumbs-up position. 4. Bend each of your elbows to an "L" shape (about 90 degrees) and keep your upper arms at your sides. 5. Step back until the band is tight and there is no slack. 6. Slowly pull your elbows back behind you. 7. Hold for __________ seconds. 8. Slowly return to the starting position. Repeat __________ times. Complete this exercise __________ times a day. Exercise K:Shoulder Press-Ups  1. Sit in a stable chair that has armrests. Sit upright, with your feet flat on the floor. 2. Put your hands on the armrests so your elbows are bent and your fingers are pointing forward. Your hands should be about even with the sides of your body. 3. Push down on the armrests and use your arms to lift yourself off of the chair. Straighten your elbows and lift yourself up as much as you comfortably can. ? Move your shoulder blades down, and avoid letting your shoulders move up toward your ears. ? Keep your feet on the ground. As you get stronger, your feet should support less of your body weight as you lift yourself up. 4. Hold for __________ seconds. 5. Slowly lower yourself back into the chair. Repeat __________ times. Complete this exercise __________ times a  day. Exercise L: Wall Push-Ups  1. Stand so you are facing a stable wall. Your feet should be about one arm-length away from the wall. 2. Lean forward and place your palms on the wall at shoulder height. 3. Keep your feet flat on the floor as you bend your elbows and lean forward toward the wall. 4. Hold for __________ seconds. 5. Straighten your elbows to push yourself back to the starting position. Repeat __________ times. Complete this exercise __________ times a day. This information is not intended to replace advice   given to you by your health care provider. Make sure you discuss any questions you have with your health care provider. Document Released: 09/04/2005 Document Revised: 07/15/2016 Document Reviewed: 07/02/2015 Elsevier Interactive Patient Education  2018 [employer]  

## 2017-08-04 NOTE — Progress Notes (Signed)
BP 138/80 (BP Location: Left Arm, Cuff Size: Normal)   Pulse 67   Temp 98.8 F (37.1 C)   Wt 192 lb 1 oz (87.1 kg)   SpO2 99%   BMI 30.08 kg/m    Subjective:    Patient ID: [name], female    DOB: 1941/03/01, 76 y.o.   MRN: [id number]  HPI: [name] is a 76 y.o. female  Chief Complaint  Patient presents with  . Arm Pain    left, achy   ARM PAIN Duration: couple of weeks  Location: deltoid and into the muscles into her forearm Mechanism of injury: unknown Onset: sudden Severity: mild  Quality:  aching Frequency: intermittent Radiation: yes- into the forearm Aggravating factors: using the push mower and movement  Alleviating factors: squeezing on it  Status: worse Treatments attempted: rest  Relief with NSAIDs?:  No NSAIDs Taken Swelling: no Redness: no  Warmth: no Trauma: no Chest pain: no  Shortness of breath: no  Fever: no Decreased sensation: no Paresthesias: no Weakness: no  Relevant past medical, surgical, family and social history reviewed and updated as indicated. Interim medical history since our last visit reviewed. Allergies and medications reviewed and updated.  Review of Systems  Constitutional: Negative.   Respiratory: Negative.   Cardiovascular: Negative.   Musculoskeletal: Positive for myalgias. Negative for arthralgias, back pain, gait problem, joint swelling, neck pain and neck stiffness.  Neurological: Negative.   Psychiatric/Behavioral: Negative.     Per HPI unless specifically indicated above     Objective:    BP 138/80 (BP Location: Left Arm, Cuff Size: Normal)   Pulse 67   Temp 98.8 F (37.1 C)   Wt 192 lb 1 oz (87.1 kg)   SpO2 99%   BMI 30.08 kg/m   Wt Readings from Last 3 Encounters:  08/04/17 192 lb 1 oz (87.1 kg)  02/06/17 184 lb (83.5 kg)  01/16/17 183 lb (83 kg)    Physical Exam  Constitutional: She is oriented to person, place, and time. She appears well-developed and well-nourished. No distress.    HENT:  Head: Normocephalic and atraumatic.  Right Ear: Hearing normal.  Left Ear: Hearing normal.  Nose: Nose normal.  Eyes: Conjunctivae and lids are normal. Right eye exhibits no discharge. Left eye exhibits no discharge. No scleral icterus.  Cardiovascular: Normal rate, normal heart sounds and intact distal pulses.  An irregular rhythm present. Exam reveals no gallop and no friction rub.   No murmur heard. Pulmonary/Chest: Effort normal. No respiratory distress. She has no wheezes. She has no rales. She exhibits no tenderness.  Neurological: She is alert and oriented to person, place, and time.  Skin: Skin is warm, dry and intact. No rash noted. She is not diaphoretic. No erythema. No pallor.  Psychiatric: She has a normal mood and affect. Her speech is normal and behavior is normal. Judgment and thought content normal. Cognition and memory are normal.  Nursing note and vitals reviewed.    Shoulder: left    Inspection:  no swelling, ecchymosis, erythema or step off deformity.     Tenderness to Palpation:    Acromion: no    AC joint:no    Clavicle: no    Bicipital groove: yes    Scapular spine: no    Coracoid process: no    Humeral head: no    Supraspinatus tendon: no     Range of Motion:  Full Range of Motion bilaterally     Muscle Strength:  5/5 bilaterally     Neuro: Sensation WNL. and Upper extremity reflexes WNL.     Special Tests:     Neer sign: Negative    Hawkins sign: Negative    Cross arm adduction: Negative    Yergason sign: Negative    O'brien sign: Negative     Speed sign: Negative  EKG shows A. Fib. No S-T Changes.   Results for orders placed or performed in visit on 79/02/40  Basic metabolic panel  Result Value Ref Range   Glucose 85 65 - 99 mg/dL   BUN 16 8 - 27 mg/dL   Creatinine, Ser 0.78 0.57 - 1.00 mg/dL   GFR calc non Af Amer 75 >59 mL/min/1.73   GFR calc Af Amer 86 >59 mL/min/1.73   BUN/Creatinine Ratio 21 12 - 28   Sodium 139 134 - 144  mmol/L   Potassium 4.5 3.5 - 5.2 mmol/L   Chloride 99 96 - 106 mmol/L   CO2 26 18 - 29 mmol/L   Calcium 9.5 8.7 - 10.3 mg/dL      Assessment & Plan:   Problem List Items Addressed This Visit    None    Visit Diagnoses    Left arm pain    -  Primary   EKG normal. Will check K+ with BMP. Start voltaren (we expect PA- on anticoag. labile HTN). Recheck next couple of weeks at AWV/6 month follow up   Relevant Orders   EKG 12-Lead (Completed)   Basic metabolic panel       Follow up plan: Return 2-3 weeks, for AWV/6 month follow up.

## 2017-08-05 ENCOUNTER — Encounter: Payer: Self-pay | Admitting: Family Medicine

## 2017-08-05 LAB — BASIC METABOLIC PANEL
BUN / CREAT RATIO: 32 — AB (ref 12–28)
BUN: 22 mg/dL (ref 8–27)
CALCIUM: 9.2 mg/dL (ref 8.7–10.3)
CHLORIDE: 100 mmol/L (ref 96–106)
CO2: 24 mmol/L (ref 20–29)
Creatinine, Ser: 0.68 mg/dL (ref 0.57–1.00)
GFR, EST AFRICAN AMERICAN: 98 mL/min/{1.73_m2} (ref >59)
GFR, EST NON AFRICAN AMERICAN: 85 mL/min/{1.73_m2} (ref >59)
Glucose: 88 mg/dL (ref 65–99)
POTASSIUM: 4.8 mmol/L (ref 3.5–5.2)
SODIUM: 140 mmol/L (ref 134–144)

## 2017-08-08 ENCOUNTER — Ambulatory Visit: Payer: Medicare PPO

## 2017-08-13 ENCOUNTER — Ambulatory Visit: Payer: Medicare PPO

## 2017-08-15 ENCOUNTER — Ambulatory Visit: Payer: Medicare PPO

## 2017-08-20 ENCOUNTER — Telehealth: Payer: Self-pay

## 2017-08-20 NOTE — Telephone Encounter (Signed)
Called to reschedule AWV with patient, left message for her to call back

## 2017-08-22 ENCOUNTER — Ambulatory Visit (INDEPENDENT_AMBULATORY_CARE_PROVIDER_SITE_OTHER): Payer: Medicare PPO

## 2017-08-22 VITALS — BP 158/78 | HR 66 | Temp 97.7°F | Resp 16 | Ht 67.0 in | Wt 189.6 lb

## 2017-08-22 DIAGNOSIS — Z0000 Encounter for general adult medical examination without abnormal findings: Secondary | ICD-10-CM | POA: Diagnosis not present

## 2017-08-22 DIAGNOSIS — Z23 Encounter for immunization: Secondary | ICD-10-CM

## 2017-08-22 NOTE — Patient Instructions (Addendum)
Ms. Thoennes , Thank you for taking time to come for your Medicare Wellness Visit. I appreciate your ongoing commitment to your health goals. Please review the following plan we discussed and let me know if I can assist you in the future.   Screening recommendations/referrals: Colonoscopy: no longer required Mammogram: no longer required Bone Density: declined Recommended yearly ophthalmology/optometry visit for glaucoma screening and checkup Recommended yearly dental visit for hygiene and checkup  Vaccinations: Influenza vaccine: done todat Pneumococcal vaccine: pneumovax 23 done today Tdap vaccine: up to date Shingles vaccine: due, check with your insurance company for coverage  Advanced directives: Advance directive discussed with you today. I have provided a copy for you to complete at home and have notarized. Once this is complete please bring a copy in to our office so we can scan it into your chart.  Conditions/risks identified: Mild exercising/stretching discussed  Next appointment: Follow up on 02/02/2018 at 1:30pm with Dr.Johnson. Follow up in one year for your annual wellness exam.    Preventive Care 65 Years and Older, Female Preventive care refers to lifestyle choices and visits with your health care provider that can promote health and wellness. What does preventive care include?  A yearly physical exam. This is also called an annual well check.  Dental exams once or twice a year.  Routine eye exams. Ask your health care provider how often you should have your eyes checked.  Personal lifestyle choices, including:  Daily care of your teeth and gums.  Regular physical activity.  Eating a healthy diet.  Avoiding tobacco and drug use.  Limiting alcohol use.  Practicing safe sex.  Taking low-dose aspirin every day.  Taking vitamin and mineral supplements as recommended by your health care provider. What happens during an annual well check? The services and  screenings done by your health care provider during your annual well check will depend on your age, overall health, lifestyle risk factors, and family history of disease. Counseling  Your health care provider may ask you questions about your:  Alcohol use.  Tobacco use.  Drug use.  Emotional well-being.  Home and relationship well-being.  Sexual activity.  Eating habits.  History of falls.  Memory and ability to understand (cognition).  Work and work Statistician.  Reproductive health. Screening  You may have the following tests or measurements:  Height, weight, and BMI.  Blood pressure.  Lipid and cholesterol levels. These may be checked every 5 years, or more frequently if you are over 62 years old.  Skin check.  Lung cancer screening. You may have this screening every year starting at age 33 if you have a 30-pack-year history of smoking and currently smoke or have quit within the past 15 years.  Fecal occult blood test (FOBT) of the stool. You may have this test every year starting at age 43.  Flexible sigmoidoscopy or colonoscopy. You may have a sigmoidoscopy every 5 years or a colonoscopy every 10 years starting at age 77.  Hepatitis C blood test.  Hepatitis B blood test.  Sexually transmitted disease (STD) testing.  Diabetes screening. This is done by checking your blood sugar (glucose) after you have not eaten for a while (fasting). You may have this done every 1-3 years.  Bone density scan. This is done to screen for osteoporosis. You may have this done starting at age 62.  Mammogram. This may be done every 1-2 years. Talk to your health care provider about how often you should have regular mammograms. Talk  with your health care provider about your test results, treatment options, and if necessary, the need for more tests. Vaccines  Your health care provider may recommend certain vaccines, such as:  Influenza vaccine. This is recommended every  year.  Tetanus, diphtheria, and acellular pertussis (Tdap, Td) vaccine. You may need a Td booster every 10 years.  Zoster vaccine. You may need this after age 23.  Pneumococcal 13-valent conjugate (PCV13) vaccine. One dose is recommended after age 79.  Pneumococcal polysaccharide (PPSV23) vaccine. One dose is recommended after age 61. Talk to your health care provider about which screenings and vaccines you need and how often you need them. This information is not intended to replace advice given to you by your health care provider. Make sure you discuss any questions you have with your health care provider. Document Released: 11/17/2015 Document Revised: 07/10/2016 Document Reviewed: 08/22/2015 Elsevier Interactive Patient Education  2017 [place] Prevention in the Home Falls can cause injuries. They can happen to people of all ages. There are many things you can do to make your home safe and to help prevent falls. What can I do on the outside of my home?  Regularly fix the edges of walkways and driveways and fix any cracks.  Remove anything that might make you trip as you walk through a door, such as a raised step or threshold.  Trim any bushes or trees on the path to your home.  Use bright outdoor lighting.  Clear any walking paths of anything that might make someone trip, such as rocks or tools.  Regularly check to see if handrails are loose or broken. Make sure that both sides of any steps have handrails.  Any raised decks and porches should have guardrails on the edges.  Have any leaves, snow, or ice cleared regularly.  Use sand or salt on walking paths during winter.  Clean up any spills in your garage right away. This includes oil or grease spills. What can I do in the bathroom?  Use night lights.  Install grab bars by the toilet and in the tub and shower. Do not use towel bars as grab bars.  Use non-skid mats or decals in the tub or shower.  If you  need to sit down in the shower, use a plastic, non-slip stool.  Keep the floor dry. Clean up any water that spills on the floor as soon as it happens.  Remove soap buildup in the tub or shower regularly.  Attach bath mats securely with double-sided non-slip rug tape.  Do not have throw rugs and other things on the floor that can make you trip. What can I do in the bedroom?  Use night lights.  Make sure that you have a light by your bed that is easy to reach.  Do not use any sheets or blankets that are too big for your bed. They should not hang down onto the floor.  Have a firm chair that has side arms. You can use this for support while you get dressed.  Do not have throw rugs and other things on the floor that can make you trip. What can I do in the kitchen?  Clean up any spills right away.  Avoid walking on wet floors.  Keep items that you use a lot in easy-to-reach places.  If you need to reach something above you, use a strong step stool that has a grab bar.  Keep electrical cords out of the way.  Do  not use floor polish or wax that makes floors slippery. If you must use wax, use non-skid floor wax.  Do not have throw rugs and other things on the floor that can make you trip. What can I do with my stairs?  Do not leave any items on the stairs.  Make sure that there are handrails on both sides of the stairs and use them. Fix handrails that are broken or loose. Make sure that handrails are as long as the stairways.  Check any carpeting to make sure that it is firmly attached to the stairs. Fix any carpet that is loose or worn.  Avoid having throw rugs at the top or bottom of the stairs. If you do have throw rugs, attach them to the floor with carpet tape.  Make sure that you have a light switch at the top of the stairs and the bottom of the stairs. If you do not have them, ask someone to add them for you. What else can I do to help prevent falls?  Wear shoes  that:  Do not have high heels.  Have rubber bottoms.  Are comfortable and fit you well.  Are closed at the toe. Do not wear sandals.  If you use a stepladder:  Make sure that it is fully opened. Do not climb a closed stepladder.  Make sure that both sides of the stepladder are locked into place.  Ask someone to hold it for you, if possible.  Clearly mark and make sure that you can see:  Any grab bars or handrails.  First and last steps.  Where the edge of each step is.  Use tools that help you move around (mobility aids) if they are needed. These include:  Canes.  Walkers.  Scooters.  Crutches.  Turn on the lights when you go into a dark area. Replace any light bulbs as soon as they burn out.  Set up your furniture so you have a clear path. Avoid moving your furniture around.  If any of your floors are uneven, fix them.  If there are any pets around you, be aware of where they are.  Review your medicines with your doctor. Some medicines can make you feel dizzy. This can increase your chance of falling. Ask your doctor what other things that you can do to help prevent falls. This information is not intended to replace advice given to you by your health care provider. Make sure you discuss any questions you have with your health care provider. Document Released: 08/17/2009 Document Revised: 03/28/2016 Document Reviewed: 11/25/2014 Elsevier Interactive Patient Education  2017 [place].  Pneumococcal Polysaccharide Vaccine: What You Need to Know 1. Why get vaccinated? Vaccination can protect older adults (and some children and younger adults) from pneumococcal disease. Pneumococcal disease is caused by bacteria that can spread from person to person through close contact. It can cause ear infections, and it can also lead to more serious infections of the:  Lungs (pneumonia),  Blood (bacteremia), and  Covering of the brain and spinal cord (meningitis).  Meningitis can cause deafness and brain damage, and it can be fatal.  Anyone can get pneumococcal disease, but children under 46 years of age, people with certain medical conditions, adults over 63 years of age, and cigarette smokers are at the highest risk. About 18,000 older adults die each year from pneumococcal disease in the [place]. Treatment of pneumococcal infections with penicillin and other drugs used to be more effective. But some strains  of the disease have become resistant to these drugs. This makes prevention of the disease, through vaccination, even more important. 2. Pneumococcal polysaccharide vaccine (PPSV23) Pneumococcal polysaccharide vaccine (PPSV23) protects against 23 types of pneumococcal bacteria. It will not prevent all pneumococcal disease. PPSV23 is recommended for:  All adults 58 years of age and older,  Anyone 2 through 77 years of age with certain long-term health problems,  Anyone 2 through 76 years of age with a weakened immune system,  Adults 43 through 76 years of age who smoke cigarettes or have asthma.  Most people need only one dose of PPSV. A second dose is recommended for certain high-risk groups. People 27 and older should get a dose even if they have gotten one or more doses of the vaccine before they turned 65. Your healthcare provider can give you more information about these recommendations. Most healthy adults develop protection within 2 to 3 weeks of getting the shot. 3. Some people should not get this vaccine  Anyone who has had a life-threatening allergic reaction to PPSV should not get another dose.  Anyone who has a severe allergy to any component of PPSV should not receive it. Tell your provider if you have any severe allergies.  Anyone who is moderately or severely ill when the shot is scheduled may be asked to wait until they recover before getting the vaccine. Someone with a mild illness can usually be vaccinated.  Children less  than 78 years of age should not receive this vaccine.  There is no evidence that PPSV is harmful to either a pregnant woman or to her fetus. However, as a precaution, women who need the vaccine should be vaccinated before becoming pregnant, if possible. 4. Risks of a vaccine reaction With any medicine, including vaccines, there is a chance of side effects. These are usually mild and go away on their own, but serious reactions are also possible. About half of people who get PPSV have mild side effects, such as redness or pain where the shot is given, which go away within about two days. Less than 1 out of 100 people develop a fever, muscle aches, or more severe local reactions. Problems that could happen after any vaccine:  People sometimes faint after a medical procedure, including vaccination. Sitting or lying down for about 15 minutes can help prevent fainting, and injuries caused by a fall. Tell your doctor if you feel dizzy, or have vision changes or ringing in the ears.  Some people get severe pain in the shoulder and have difficulty moving the arm where a shot was given. This happens very rarely.  Any medication can cause a severe allergic reaction. Such reactions from a vaccine are very rare, estimated at about 1 in a million doses, and would happen within a few minutes to a few hours after the vaccination. As with any medicine, there is a very remote chance of a vaccine causing a serious injury or death. The safety of vaccines is always being monitored. For more information, visit: http://www.aguilar.org/ 5. What if there is a serious reaction? What should I look for? Look for anything that concerns you, such as signs of a severe allergic reaction, very high fever, or unusual behavior. Signs of a severe allergic reaction can include hives, swelling of the face and throat, difficulty breathing, a fast heartbeat, dizziness, and weakness. These would usually start a few minutes to a few  hours after the vaccination. What should I do? If you think it is  a severe allergic reaction or other emergency that can't wait, call 9-1-1 or get to the nearest hospital. Otherwise, call your doctor. Afterward, the reaction should be reported to the Vaccine Adverse Event Reporting System (VAERS). Your doctor might file this report, or you can do it yourself through the VAERS web site at www.vaers.SamedayNews.es, or by calling (215) 316-8211. VAERS does not give medical advice. 6. How can I learn more?  Ask your doctor. He or she can give you the vaccine package insert or suggest other sources of information.  Call your local or state health department.  Contact the Centers for Disease Control and Prevention (CDC): ? Call (864)005-1613 (1-800-CDC-INFO) or ? Visit CDC's website at http://hunter.com/ CDC Pneumococcal Polysaccharide Vaccine VIS (02/25/14) This information is not intended to replace advice given to you by your health care provider. Make sure you discuss any questions you have with your health care provider. Document Released: 08/18/2006 Document Revised: 07/11/2016 Document Reviewed: 07/11/2016 Elsevier Interactive Patient Education  2017 [place]. Influenza (Flu) Vaccine (Inactivated or Recombinant): What You Need to Know 1. Why get vaccinated? Influenza ("flu") is a contagious disease that spreads around the [place] every year, usually between October and May. Flu is caused by influenza viruses, and is spread mainly by coughing, sneezing, and close contact. Anyone can get flu. Flu strikes suddenly and can last several days. Symptoms vary by age, but can include:  fever/chills  sore throat  muscle aches  fatigue  cough  headache  runny or stuffy nose  Flu can also lead to pneumonia and blood infections, and cause diarrhea and seizures in children. If you have a medical condition, such as heart or lung disease, flu can make it worse. Flu is more dangerous for  some people. Infants and young children, people 47 years of age and older, pregnant women, and people with certain health conditions or a weakened immune system are at greatest risk. Each year thousands of people in the [place] die from flu, and many more are hospitalized. Flu vaccine can:  keep you from getting flu,  make flu less severe if you do get it, and  keep you from spreading flu to your family and other people. 2. Inactivated and recombinant flu vaccines A dose of flu vaccine is recommended every flu season. Children 6 months through 76 years of age may need two doses during the same flu season. Everyone else needs only one dose each flu season. Some inactivated flu vaccines contain a very small amount of a mercury-based preservative called thimerosal. Studies have not shown thimerosal in vaccines to be harmful, but flu vaccines that do not contain thimerosal are available. There is no live flu virus in flu shots. They cannot cause the flu. There are many flu viruses, and they are always changing. Each year a new flu vaccine is made to protect against three or four viruses that are likely to cause disease in the upcoming flu season. But even when the vaccine doesn't exactly match these viruses, it may still provide some protection. Flu vaccine cannot prevent:  flu that is caused by a virus not covered by the vaccine, or  illnesses that look like flu but are not.  It takes about 2 weeks for protection to develop after vaccination, and protection lasts through the flu season. 3. Some people should not get this vaccine Tell the person who is giving you the vaccine:  If you have any severe, life-threatening allergies. If you ever had a life-threatening allergic  reaction after a dose of flu vaccine, or have a severe allergy to any part of this vaccine, you may be advised not to get vaccinated. Most, but not all, types of flu vaccine contain a small amount of egg protein.  If you  ever had Guillain-Barr Syndrome (also called GBS). Some people with a history of GBS should not get this vaccine. This should be discussed with your doctor.  If you are not feeling well. It is usually okay to get flu vaccine when you have a mild illness, but you might be asked to come back when you feel better.  4. Risks of a vaccine reaction With any medicine, including vaccines, there is a chance of reactions. These are usually mild and go away on their own, but serious reactions are also possible. Most people who get a flu shot do not have any problems with it. [place] problems following a flu shot include:  soreness, redness, or swelling where the shot was given  hoarseness  sore, red or itchy eyes  cough  fever  aches  headache  itching  fatigue  If these problems occur, they usually begin soon after the shot and last 1 or 2 days. More serious problems following a flu shot can include the following:  There may be a small increased risk of Guillain-Barre Syndrome (GBS) after inactivated flu vaccine. This risk has been estimated at 1 or 2 additional cases per million people vaccinated. This is much lower than the risk of severe complications from flu, which can be prevented by flu vaccine.  Young children who get the flu shot along with pneumococcal vaccine (PCV13) and/or DTaP vaccine at the same time might be slightly more likely to have a seizure caused by fever. Ask your doctor for more information. Tell your doctor if a child who is getting flu vaccine has ever had a seizure.  Problems that could happen after any injected vaccine:  People sometimes faint after a medical procedure, including vaccination. Sitting or lying down for about 15 minutes can help prevent fainting, and injuries caused by a fall. Tell your doctor if you feel dizzy, or have vision changes or ringing in the ears.  Some people get severe pain in the shoulder and have difficulty moving the arm where a  shot was given. This happens very rarely.  Any medication can cause a severe allergic reaction. Such reactions from a vaccine are very rare, estimated at about 1 in a million doses, and would happen within a few minutes to a few hours after the vaccination. As with any medicine, there is a very remote chance of a vaccine causing a serious injury or death. The safety of vaccines is always being monitored. For more information, visit: http://www.aguilar.org/ 5. What if there is a serious reaction? What should I look for? Look for anything that concerns you, such as signs of a severe allergic reaction, very high fever, or unusual behavior. Signs of a severe allergic reaction can include hives, swelling of the face and throat, difficulty breathing, a fast heartbeat, dizziness, and weakness. These would start a few minutes to a few hours after the vaccination. What should I do?  If you think it is a severe allergic reaction or other emergency that can't wait, call 9-1-1 and get the person to the nearest hospital. Otherwise, call your doctor.  Reactions should be reported to the Vaccine Adverse Event Reporting System (VAERS). Your doctor should file this report, or you can do it yourself through  the VAERS web site at www.vaers.SamedayNews.es, or by calling 470-309-3544. ? VAERS does not give medical advice. 6. The National Vaccine Injury Compensation Program The [employer] Vaccine Injury Compensation Program (VICP) is a federal program that was created to compensate people who may have been injured by certain vaccines. Persons who believe they may have been injured by a vaccine can learn about the program and about filing a claim by calling 316-338-2276 or visiting the [place] website at GoldCloset.com.ee. There is a time limit to file a claim for compensation. 7. How can I learn more?  Ask your healthcare provider. He or she can give you the vaccine package insert or suggest other sources of  information.  Call your local or state health department.  Contact the Centers for Disease Control and Prevention (CDC): ? Call 617-181-7437 (1-800-CDC-INFO) or ? Visit CDC's website at https://gibson.com/ Vaccine Information Statement, Inactivated Influenza Vaccine (06/10/2014) This information is not intended to replace advice given to you by your health care provider. Make sure you discuss any questions you have with your health care provider. Document Released: 08/15/2006 Document Revised: 07/11/2016 Document Reviewed: 07/11/2016 Elsevier Interactive Patient Education  2017 [employer].

## 2017-08-22 NOTE — Progress Notes (Signed)
Subjective:   [name] is a 76 y.o. female who presents for Medicare Annual (Subsequent) preventive examination.  Review of Systems:  Cardiac Risk Factors include: hypertension;advanced age (>26men, >86 women)     Objective:     Vitals: BP (!) 192/79 (BP Location: Right Arm, Patient Position: Sitting)   Pulse 66   Temp 97.7 F (36.5 C) (Oral)   Resp 16   Ht 5\' 7"  (1.702 m)   Wt 189 lb 9.6 oz (86 kg)   BMI 29.70 kg/m   Body mass index is 29.7 kg/m.   Tobacco History  Smoking Status  . Former Smoker  . Quit date: 11/04/1978  Smokeless Tobacco  . Never Used     Counseling given: Not Answered   Past Medical History:  Diagnosis Date  . Cat bite of right lower leg [id number]  . Cellulitis of lower leg 12/07/2014  . Hypertension    History reviewed. No pertinent surgical history. Family History  Problem Relation Age of Onset  . Hypertension Mother   . Alcohol abuse Father   . Heart disease Father   . Hypertension Father   . Hyperlipidemia Sister   . Hypertension Sister   . Alcohol abuse Brother   . Hypertension Brother    History  Sexual Activity  . Sexual activity: No    Outpatient Encounter Prescriptions as of 08/22/2017  Medication Sig  . losartan (COZAAR) 100 MG tablet TAKE 1 TABLET(100 MG) BY MOUTH DAILY  . metoprolol succinate (TOPROL-XL) 50 MG 24 hr tablet TAKE 1 TABLET BY MOUTH DAILY. TAKE WITH OR IMMEDIATELY FOLLOWING A MEAL  . XARELTO 20 MG TABS tablet TAKE 1 TABLET(20 MG) BY MOUTH DAILY WITH SUPPER  . diclofenac sodium (VOLTAREN) 1 % GEL Apply 4 g topically 4 (four) times daily. (Patient not taking: Reported on 08/22/2017)   No facility-administered encounter medications on file as of 08/22/2017.     Activities of Daily Living In your present state of health, do you have any difficulty performing the following activities: 08/22/2017  Hearing? N  Vision? N  Difficulty concentrating or making decisions? N  Walking or climbing stairs? N    Dressing or bathing? N  Doing errands, shopping? N  Preparing Food and eating ? N  Using the Toilet? N  In the past six months, have you accidently leaked urine? N  Do you have problems with loss of bowel control? N  Managing your Medications? N  Managing your Finances? N  Housekeeping or managing your Housekeeping? N  Some recent data might be hidden    Patient Care Team: [name], DO as PCP - General (Family Medicine) Minna Merritts, MD as Consulting Physician (Cardiology)    Assessment:     Exercise Activities and Dietary recommendations Current Exercise Habits: The patient does not participate in regular exercise at present, Intensity: Mild, Exercise limited by: None identified  Goals    . Exercise 150 minutes per week (moderate activity)          Mild exercising/stretching discussed      Fall Risk Fall Risk  08/22/2017 08/06/2016 04/11/2015  Falls in the past year? No No No   Depression Screen PHQ 2/9 Scores 08/22/2017 08/06/2016 04/11/2015  PHQ - 2 Score 0 0 0     Cognitive Function     6CIT Screen 08/22/2017  What Year? 0 points  What month? 0 points  What time? 0 points  Count back from 20 0 points  Months in  reverse 0 points  Repeat phrase 0 points  Total Score 0    Immunization History  Administered Date(s) Administered  . Influenza, High Dose Seasonal PF 08/06/2016, 08/22/2017  . Influenza-Unspecified 12/19/2015  . Pneumococcal Conjugate-13 08/06/2016  . Pneumococcal Polysaccharide-23 08/22/2017  . Td 12/07/2014   Screening Tests Health Maintenance  Topic Date Due  . PNA vac Low Risk Adult (2 of 2 - PPSV23) 08/06/2017  . INFLUENZA VACCINE  09/04/2017 (Originally 06/04/2017)  . DEXA SCAN  08/04/2018 (Originally 04/04/2006)  . TETANUS/TDAP  12/07/2024      Plan:     I have personally reviewed and addressed the Medicare Annual Wellness questionnaire and have noted the following in the patient's chart:  A. Medical and social  history B. Use of alcohol, tobacco or illicit drugs  C. Current medications and supplements D. Functional ability and status E.  Nutritional status F.  Physical activity G. Advance directives H. List of other physicians I.  Hospitalizations, surgeries, and ER visits in previous 12 months J.  [place] such as hearing and vision if needed, cognitive and depression L. Referrals and appointments   In addition, I have reviewed and discussed with patient certain preventive protocols, quality metrics, and best practice recommendations. A written personalized care plan for preventive services as well as general preventive health recommendations were provided to patient.   Signed,  Tyler Aas, LPN Nurse Health Advisor   MD Recommendations: none

## 2017-10-08 ENCOUNTER — Telehealth: Payer: Self-pay | Admitting: Family Medicine

## 2017-10-08 DIAGNOSIS — Z5989 Other problems related to housing and economic circumstances: Secondary | ICD-10-CM

## 2017-10-08 DIAGNOSIS — Z598 Other problems related to housing and economic circumstances: Secondary | ICD-10-CM

## 2017-10-08 NOTE — Telephone Encounter (Signed)
Copied from [place]. Topic: General - Other >> Oct 08, 2017 10:21 AM [place], Laban Emperor wrote: Reason for CRM:  Pt states she would like Dr. Wynetta Emery to call her, states she has a question about insurance.

## 2017-10-08 NOTE — Telephone Encounter (Signed)
Patient needs help picking a medicare plan. Referred to [facility] and will also put in referral to [facility] to see if they can direct her to someone who can go over this with her.

## 2017-11-12 ENCOUNTER — Other Ambulatory Visit: Payer: Self-pay | Admitting: Cardiovascular Disease

## 2017-11-12 NOTE — Telephone Encounter (Signed)
Please review for refill, Thanks !  

## 2017-12-22 ENCOUNTER — Encounter (INDEPENDENT_AMBULATORY_CARE_PROVIDER_SITE_OTHER): Payer: Self-pay | Admitting: Vascular Surgery

## 2017-12-22 ENCOUNTER — Ambulatory Visit (INDEPENDENT_AMBULATORY_CARE_PROVIDER_SITE_OTHER): Payer: Medicare HMO | Admitting: Vascular Surgery

## 2017-12-22 VITALS — BP 143/78 | HR 67 | Resp 17 | Ht 67.0 in | Wt 192.0 lb

## 2017-12-22 DIAGNOSIS — I482 Chronic atrial fibrillation, unspecified: Secondary | ICD-10-CM

## 2017-12-22 DIAGNOSIS — I872 Venous insufficiency (chronic) (peripheral): Secondary | ICD-10-CM

## 2017-12-22 DIAGNOSIS — I10 Essential (primary) hypertension: Secondary | ICD-10-CM | POA: Diagnosis not present

## 2017-12-22 DIAGNOSIS — M150 Primary generalized (osteo)arthritis: Secondary | ICD-10-CM | POA: Diagnosis not present

## 2017-12-22 DIAGNOSIS — M159 Polyosteoarthritis, unspecified: Secondary | ICD-10-CM

## 2017-12-22 MED ORDER — RIVAROXABAN 20 MG PO TABS: 20.0000 mg | ORAL_TABLET | Freq: Every day | ORAL | 5 refills | Status: DC

## 2017-12-22 NOTE — Progress Notes (Signed)
MRN : [id number]  [name] is a 77 y.o. (05-04-1941) female who presents with chief complaint of  Chief Complaint  Patient presents with  . Follow-up    6 month no studies  .  History of Present Illness: Patient is seen for follow up evaluation of leg pain and swelling associated with venous ulceration.  She notes her left venous ulcer is all healed. The pain and swelling worsens with prolonged dependency and improves with elevation.  The patient notes that in the morning the legs are better but the leg symptoms worsened throughout the course of the day. The patient has also noted a progressive worsening of the discoloration in the ankle and shin area.   She has been wearing graduated compression.  The patient states that they have been elevating as much as possible. The patient denies any recent changes in medications.  The patient denies a history of DVT or PE. There is no prior history of phlebitis. There is no history of primary lymphedema.  No SOB or increased cough.  No sputum production.  No recent episodes of CHF exacerbation.   Current Meds  Medication Sig  . furosemide (LASIX) 20 MG tablet Take 20 mg by mouth.  . losartan (COZAAR) 100 MG tablet TAKE 1 TABLET(100 MG) BY MOUTH DAILY  . metoprolol succinate (TOPROL-XL) 50 MG 24 hr tablet TAKE 1 TABLET BY MOUTH DAILY. TAKE WITH OR IMMEDIATELY FOLLOWING A MEAL  . potassium chloride (KLOR-CON) 20 MEQ packet Take 20 mEq by mouth 2 (two) times daily.  . rivaroxaban (XARELTO) 20 MG TABS tablet Take 1 tablet (20 mg total) by mouth daily with supper.  . [DISCONTINUED] rivaroxaban (XARELTO) 20 MG TABS tablet Take 1 tablet (20 mg total) by mouth daily with supper.  . [DISCONTINUED] rivaroxaban (XARELTO) 20 MG TABS tablet Take 1 tablet (20 mg total) by mouth daily with supper.    Past Medical History:  Diagnosis Date  . Cat bite of right lower leg [id number]  . Cellulitis of lower leg 12/07/2014  . Hypertension     History  reviewed. No pertinent surgical history.  Social History Social History   Tobacco Use  . Smoking status: Former Smoker    Last attempt to quit: 11/04/1978    Years since quitting: 39.1  . Smokeless tobacco: Never Used  Substance Use Topics  . Alcohol use: No  . Drug use: No    Family History Family History  Problem Relation Age of Onset  . Hypertension Mother   . Alcohol abuse Father   . Heart disease Father   . Hypertension Father   . Hyperlipidemia Sister   . Hypertension Sister   . Alcohol abuse Brother   . Hypertension Brother     Allergies  Allergen Reactions  . Amlodipine     Leg swelling  . Hctz [Hydrochlorothiazide] Nausea Only and Rash     REVIEW OF SYSTEMS (Negative unless checked)  Constitutional: [] Weight loss  [] Fever  [] Chills Cardiac: [] Chest pain   [] Chest pressure   [] Palpitations   [] Shortness of breath when laying flat   [] Shortness of breath with exertion. Vascular:  [] Pain in legs with walking   [] Pain in legs at rest  [] History of DVT   [] Phlebitis   [x] Swelling in legs   [x] Varicose veins   [] Non-healing ulcers Pulmonary:   [] Uses home oxygen   [] Productive cough   [] Hemoptysis   [] Wheeze  [] COPD   [] Asthma Neurologic:  [] Dizziness   [] Seizures   []   History of stroke   [] History of TIA  [] Aphasia   [] Vissual changes   [] Weakness or numbness in arm   [] Weakness or numbness in leg Musculoskeletal:   [] Joint swelling   [] Joint pain   [] Low back pain Hematologic:  [] Easy bruising  [] Easy bleeding   [] Hypercoagulable state   [] Anemic Gastrointestinal:  [] Diarrhea   [] Vomiting  [] Gastroesophageal reflux/heartburn   [] Difficulty swallowing. Genitourinary:  [] Chronic kidney disease   [] Difficult urination  [] Frequent urination   [] Blood in urine Skin:  [x] Rashes   [] Ulcers  Psychological:  [] History of anxiety   []  History of major depression.  Physical Examination  Vitals:   12/22/17 1617  BP: (!) 143/78  Pulse: 67  Resp: 17  Weight: 192 lb (87.1  kg)  Height: 5\' 7"  (1.702 m)   Body mass index is 30.07 kg/m. Gen: WD/WN, NAD Head: [place]/AT, No temporalis wasting.  Ear/Nose/Throat: Hearing grossly intact, nares w/o erythema or drainage Eyes: PER, EOMI, sclera nonicteric.  Neck: Supple, no large masses.   Pulmonary:  Good air movement, no audible wheezing bilaterally, no use of accessory muscles.  Cardiac: RRR, no JVD Vascular: diffuse large varicosities present bilaterally.  severe venous stasis changes to the legs bilaterally.  3+ soft pitting edema Vessel Right Left  Radial Palpable Palpable  PT Trace Palpable Trace Palpable  DP Trace Palpable Trace Palpable  Gastrointestinal: Non-distended. No guarding/no peritoneal signs.  Musculoskeletal: M/S 5/5 throughout.  No deformity or atrophy.  Neurologic: CN 2-12 intact. Symmetrical.  Speech is fluent. Motor exam as listed above. Psychiatric: Judgment intact, Mood & affect appropriate for pt's clinical situation. Dermatologic: venous rashes healed ulcers noted.  No changes consistent with cellulitis. Lymph : No lichenification or skin changes of chronic lymphedema.  CBC Lab Results  Component Value Date   WBC 6.4 08/06/2016   HGB 12.8 08/06/2016   HCT 39.0 08/06/2016   MCV 91 08/06/2016   PLT 284 08/06/2016    BMET    Component Value Date/Time   NA 140 08/04/2017 1410   NA 137 11/16/2012 1747   K 4.8 08/04/2017 1410   K 3.5 11/16/2012 1747   CL 100 08/04/2017 1410   CL 104 11/16/2012 1747   CO2 24 08/04/2017 1410   CO2 26 11/16/2012 1747   GLUCOSE 88 08/04/2017 1410   GLUCOSE 101 (H) 11/16/2012 1747   BUN 22 08/04/2017 1410   BUN 11 11/16/2012 1747   CREATININE 0.68 08/04/2017 1410   CREATININE 0.55 (L) 11/16/2012 1747   CALCIUM 9.2 08/04/2017 1410   CALCIUM 9.4 11/16/2012 1747   GFRNONAA 85 08/04/2017 1410   GFRNONAA >60 11/16/2012 1747   GFRAA 98 08/04/2017 1410   GFRAA >60 11/16/2012 1747   CrCl cannot be calculated (Patient's most recent lab result is older  than the maximum 21 days allowed.).  COAG No results found for: INR, PROTIME  Radiology No results found.   Assessment/Plan 1. Chronic venous insufficiency No surgery or intervention at this point in time.    I have had a long discussion with the patient regarding venous insufficiency and why it  causes symptoms. I have discussed with the patient the chronic skin changes that accompany venous insufficiency and the long term sequela such as infection and ulceration.  Patient will begin wearing graduated compression stockings class 1 (20-30 mmHg) or compression wraps on a daily basis a prescription was given. The patient will put the stockings on first thing in the morning and removing them in the evening. The  patient is instructed specifically not to sleep in the stockings.    In addition, behavioral modification including several periods of elevation of the lower extremities during the day will be continued. I have demonstrated that proper elevation is a position with the ankles at heart level.  The patient is instructed to begin routine exercise, especially walking on a daily basis  She will follow up PRN  2. Essential hypertension Continue antihypertensive medications as already ordered, these medications have been reviewed and there are no changes at this time.   3. Chronic atrial fibrillation (HCC) Continue antiarrhythmia medications as already ordered, these medications have been reviewed and there are no changes at this time.  Continue anticoagulation as ordered by Cardiology Service   4. Primary osteoarthritis involving multiple joints Continue NSAID medications as already ordered, these medications have been reviewed and there are no changes at this time.  Continued activity and therapy was stressed.     Hortencia Pilar, MD  12/22/2017 4:54 PM

## 2018-02-02 ENCOUNTER — Ambulatory Visit: Payer: Medicare PPO | Admitting: Family Medicine

## 2018-02-10 ENCOUNTER — Encounter: Payer: Self-pay | Admitting: Family Medicine

## 2018-02-10 ENCOUNTER — Ambulatory Visit (INDEPENDENT_AMBULATORY_CARE_PROVIDER_SITE_OTHER): Payer: Medicare HMO | Admitting: Family Medicine

## 2018-02-10 VITALS — BP 169/83 | HR 60 | Temp 99.5°F | Wt 198.2 lb

## 2018-02-10 DIAGNOSIS — I482 Chronic atrial fibrillation, unspecified: Secondary | ICD-10-CM

## 2018-02-10 DIAGNOSIS — N63 Unspecified lump in unspecified breast: Secondary | ICD-10-CM

## 2018-02-10 DIAGNOSIS — I10 Essential (primary) hypertension: Secondary | ICD-10-CM

## 2018-02-10 MED ORDER — METOPROLOL SUCCINATE ER 50 MG PO TB24: ORAL_TABLET | ORAL | 3 refills | Status: DC

## 2018-02-10 MED ORDER — LOSARTAN POTASSIUM 100 MG PO TABS
ORAL_TABLET | ORAL | 3 refills | Status: DC
Start: 2018-02-10 — End: 2018-08-27

## 2018-02-10 NOTE — Progress Notes (Signed)
BP (!) 169/83 (BP Location: Left Arm, Patient Position: Sitting, Cuff Size: Normal)   Pulse 60   Temp 99.5 F (37.5 C)   Wt 198 lb 3 oz (89.9 kg)   SpO2 98%   BMI 31.04 kg/m    Subjective:    Patient ID: [name], female    DOB: 10-17-1941, 77 y.o.   MRN: [id number]  HPI: [name] is a 77 y.o. female  Chief Complaint  Patient presents with  . Hypertension   BREAST CHANGES Duration: 6 months Location: left Onset: gradual Redness: no Swelling: yes Trauma: no trauma Breastfeeding: no Associated with menstral cycle: N/A Nipple discharge: no Breast lump: no Status: stable Treatments attempted: none Previous mammogram: no  HYPERTENSION- has taking her medicine Hypertension status: stable  Satisfied with current treatment? yes Duration of hypertension: chronic BP monitoring frequency:  Yes- better at home BP medication side effects:  no Medication compliance: excellent compliance Previous BP meds: metoprolol, losartan, lasix Aspirin: no Recurrent headaches: no Visual changes: no Palpitations: no Dyspnea: no Chest pain: no Lower extremity edema: no Dizzy/lightheaded: no  ATRIAL FIBRILLATION Atrial fibrillation status: controlled Satisfied with current treatment: yes  Medication side effects:  no Medication compliance: excellent compliance Palpitations:  no Chest pain:  no Dyspnea on exertion:  no Orthopnea:  no Syncope:  no Edema:  no Ventricular rate control: B-blocker Anti-coagulation: long acting   Relevant past medical, surgical, family and social history reviewed and updated as indicated. Interim medical history since our last visit reviewed. Allergies and medications reviewed and updated.  Review of Systems  Constitutional: Negative.   Respiratory: Negative.   Cardiovascular: Negative.   Skin: Negative.   Neurological: Negative.   Psychiatric/Behavioral: Negative.     Per HPI unless specifically indicated above     Objective:    BP (!) 169/83 (BP Location: Left Arm, Patient Position: Sitting, Cuff Size: Normal)   Pulse 60   Temp 99.5 F (37.5 C)   Wt 198 lb 3 oz (89.9 kg)   SpO2 98%   BMI 31.04 kg/m   Wt Readings from Last 3 Encounters:  02/10/18 198 lb 3 oz (89.9 kg)  12/22/17 192 lb (87.1 kg)  08/22/17 189 lb 9.6 oz (86 kg)    Physical Exam  Constitutional: She is oriented to person, place, and time. She appears well-developed and well-nourished. No distress.  HENT:  Head: Normocephalic and atraumatic.  Right Ear: Hearing normal.  Left Ear: Hearing normal.  Nose: Nose normal.  Eyes: Conjunctivae and lids are normal. Right eye exhibits no discharge. Left eye exhibits no discharge. No scleral icterus.  Cardiovascular: Normal rate, regular rhythm, normal heart sounds and intact distal pulses. Exam reveals no gallop and no friction rub.  No murmur heard. Pulmonary/Chest: Effort normal and breath sounds normal. No stridor. No respiratory distress. She has no wheezes. She has no rales. She exhibits no tenderness. Right breast exhibits no inverted nipple, no mass, no nipple discharge, no skin change and no tenderness. Left breast exhibits no inverted nipple, no mass, no nipple discharge, no skin change and no tenderness. No breast swelling, tenderness, discharge or bleeding. Breasts are asymmetrical.  Slightly more full L breast than R breast  Musculoskeletal: Normal range of motion.  Neurological: She is alert and oriented to person, place, and time.  Skin: Skin is warm, dry and intact. Capillary refill takes less than 2 seconds. No rash noted. She is not diaphoretic. No erythema. No pallor.  Psychiatric: She has a  normal mood and affect. Her speech is normal and behavior is normal. Judgment and thought content normal. Cognition and memory are normal.    Results for orders placed or performed in visit on 79/48/01  Basic metabolic panel  Result Value Ref Range   Glucose 88 65 - 99 mg/dL   BUN 22 8 - 27 mg/dL     Creatinine, Ser 0.68 0.57 - 1.00 mg/dL   GFR calc non Af Amer 85 >59 mL/min/1.73   GFR calc Af Amer 98 >59 mL/min/1.73   BUN/Creatinine Ratio 32 (H) 12 - 28   Sodium 140 134 - 144 mmol/L   Potassium 4.8 3.5 - 5.2 mmol/L   Chloride 100 96 - 106 mmol/L   CO2 24 20 - 29 mmol/L   Calcium 9.2 8.7 - 10.3 mg/dL      Assessment & Plan:   Problem List Items Addressed This Visit      Cardiovascular and Mediastinum   HTN (hypertension) - Primary    Better at home. Continue current regimen. Continue to monitor. Call with any concerns.       Relevant Medications   metoprolol succinate (TOPROL-XL) 50 MG 24 hr tablet   losartan (COZAAR) 100 MG tablet   Other Relevant Orders   Basic metabolic panel   Atrial fibrillation (HCC)    Under good control. Continue xarelto. Call with any concerns.       Relevant Medications   metoprolol succinate (TOPROL-XL) 50 MG 24 hr tablet   losartan (COZAAR) 100 MG tablet   Other Relevant Orders   CBC with Differential/Platelet    Other Visit Diagnoses    Swelling of breast       Mammogram ordered today. Call with any concerns.    Relevant Orders   MM Digital Diagnostic Bilat   US BREAST LTD UNI LEFT INC AXILLA   US BREAST LTD UNI RIGHT INC AXILLA       Follow up plan: Return in about 6 months (around 08/12/2018) for Physical.

## 2018-02-10 NOTE — Assessment & Plan Note (Signed)
Better at home. Continue current regimen. Continue to monitor. Call with any concerns.

## 2018-02-10 NOTE — Assessment & Plan Note (Signed)
Under good control. Continue xarelto. Call with any concerns.

## 2018-02-11 ENCOUNTER — Encounter: Payer: Self-pay | Admitting: Family Medicine

## 2018-02-11 LAB — CBC WITH DIFFERENTIAL/PLATELET
BASOS: 0 %
Basophils Absolute: 0 10*3/uL (ref 0.0–0.2)
EOS (ABSOLUTE): 0.2 10*3/uL (ref 0.0–0.4)
EOS: 4 %
HEMATOCRIT: 41.5 % (ref 34.0–46.6)
Hemoglobin: 13.4 g/dL (ref 11.1–15.9)
IMMATURE GRANULOCYTES: 0 %
Immature Grans (Abs): 0 10*3/uL (ref 0.0–0.1)
LYMPHS ABS: 1.5 10*3/uL (ref 0.7–3.1)
Lymphs: 24 %
MCH: 30.2 pg (ref 26.6–33.0)
MCHC: 32.3 g/dL (ref 31.5–35.7)
MCV: 94 fL (ref 79–97)
MONOS ABS: 0.5 10*3/uL (ref 0.1–0.9)
Monocytes: 9 %
NEUTROS ABS: 3.9 10*3/uL (ref 1.4–7.0)
NEUTROS PCT: 63 %
Platelets: 289 10*3/uL (ref 150–379)
RBC: 4.43 x10E6/uL (ref 3.77–5.28)
RDW: 14 % (ref 12.3–15.4)
WBC: 6.2 10*3/uL (ref 3.4–10.8)

## 2018-02-11 LAB — BASIC METABOLIC PANEL
BUN / CREAT RATIO: 30 — AB (ref 12–28)
BUN: 21 mg/dL (ref 8–27)
CO2: 23 mmol/L (ref 20–29)
CREATININE: 0.7 mg/dL (ref 0.57–1.00)
Calcium: 9.2 mg/dL (ref 8.7–10.3)
Chloride: 103 mmol/L (ref 96–106)
GFR, EST AFRICAN AMERICAN: 97 mL/min/{1.73_m2} (ref >59)
GFR, EST NON AFRICAN AMERICAN: 84 mL/min/{1.73_m2} (ref >59)
Glucose: 100 mg/dL — ABNORMAL HIGH (ref 65–99)
POTASSIUM: 4.6 mmol/L (ref 3.5–5.2)
SODIUM: 142 mmol/L (ref 134–144)

## 2018-02-17 ENCOUNTER — Telehealth: Payer: Self-pay | Admitting: Family Medicine

## 2018-02-17 ENCOUNTER — Ambulatory Visit
Admission: RE | Admit: 2018-02-17 | Discharge: 2018-02-17 | Disposition: A | Discharge status: Home / Self Care | Payer: Medicare HMO | Source: Ambulatory Visit | Attending: Family Medicine | Admitting: Family Medicine

## 2018-02-17 DIAGNOSIS — N63 Unspecified lump in unspecified breast: Secondary | ICD-10-CM

## 2018-02-17 DIAGNOSIS — D242 Benign neoplasm of left breast: Secondary | ICD-10-CM | POA: Diagnosis not present

## 2018-02-17 DIAGNOSIS — N6320 Unspecified lump in the left breast, unspecified quadrant: Secondary | ICD-10-CM | POA: Diagnosis not present

## 2018-02-17 DIAGNOSIS — R922 Inconclusive mammogram: Secondary | ICD-10-CM | POA: Diagnosis not present

## 2018-02-17 NOTE — Telephone Encounter (Signed)
Please let her know that her US showed cysts in her breasts, but nothing to worry about. If they are bothering her, we can send her to get them drained, but they are nothing to worry about. Thanks!

## 2018-02-17 NOTE — Telephone Encounter (Signed)
Patient notified

## 2018-04-16 ENCOUNTER — Other Ambulatory Visit (INDEPENDENT_AMBULATORY_CARE_PROVIDER_SITE_OTHER): Payer: Self-pay | Admitting: Vascular Surgery

## 2018-05-28 DIAGNOSIS — I10 Essential (primary) hypertension: Secondary | ICD-10-CM | POA: Diagnosis not present

## 2018-05-28 DIAGNOSIS — Z7901 Long term (current) use of anticoagulants: Secondary | ICD-10-CM | POA: Diagnosis not present

## 2018-05-28 DIAGNOSIS — E669 Obesity, unspecified: Secondary | ICD-10-CM | POA: Diagnosis not present

## 2018-05-28 DIAGNOSIS — Z87891 Personal history of nicotine dependence: Secondary | ICD-10-CM | POA: Diagnosis not present

## 2018-05-28 DIAGNOSIS — Z6831 Body mass index (BMI) 31.0-31.9, adult: Secondary | ICD-10-CM | POA: Diagnosis not present

## 2018-05-28 DIAGNOSIS — I4891 Unspecified atrial fibrillation: Secondary | ICD-10-CM | POA: Diagnosis not present

## 2018-07-12 ENCOUNTER — Other Ambulatory Visit (INDEPENDENT_AMBULATORY_CARE_PROVIDER_SITE_OTHER): Payer: Self-pay | Admitting: Vascular Surgery

## 2018-07-18 IMAGING — US US BREAST*L* LIMITED INC AXILLA
1 series · 10 of 10 positions shown · non-contrast
Comparison: Baseline mammogram.

CLINICAL DATA: Left breast swelling reported by the patient.

EXAM:
DIGITAL DIAGNOSTIC BILATERAL MAMMOGRAM WITH CAD AND TOMO
ULTRASOUND LEFT BREAST

[Series 1: us breast*left* limited inc axilla · 0.04mm/px · 10 of 10 slices shown]
[im 1/10]
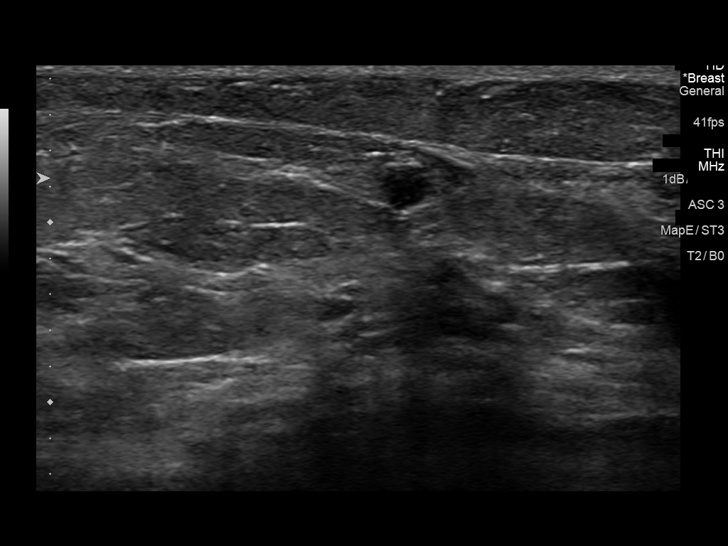
[im 2/10]
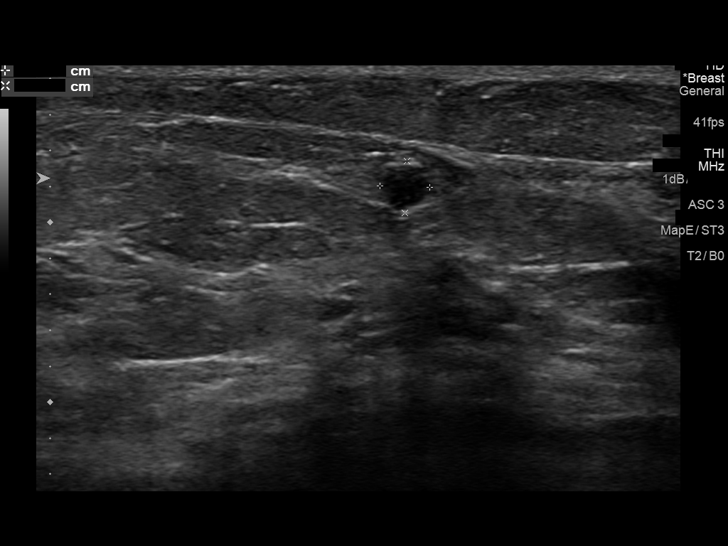
[im 3/10]
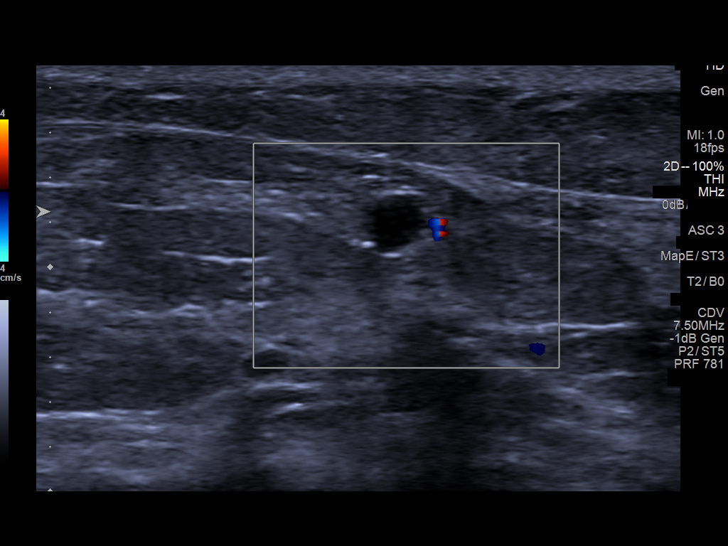
[im 4/10]
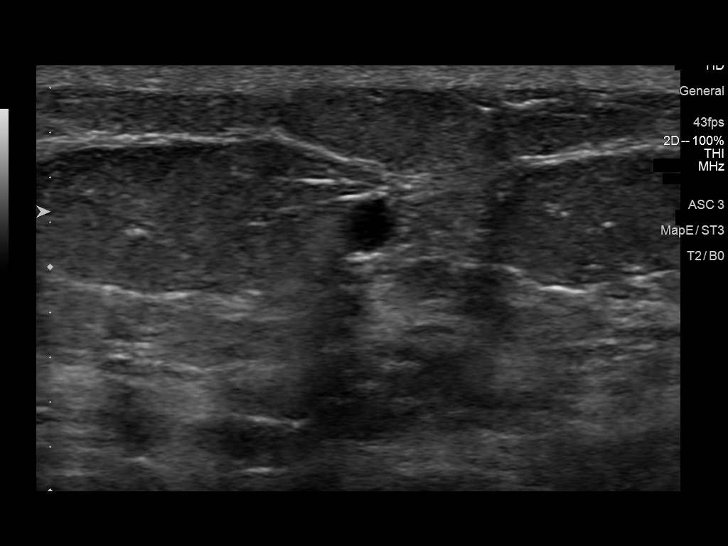
[im 5/10]
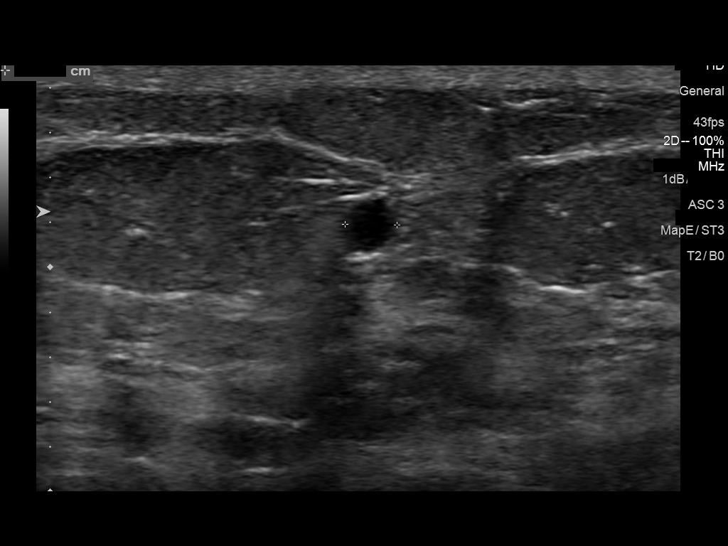
[im 6/10]
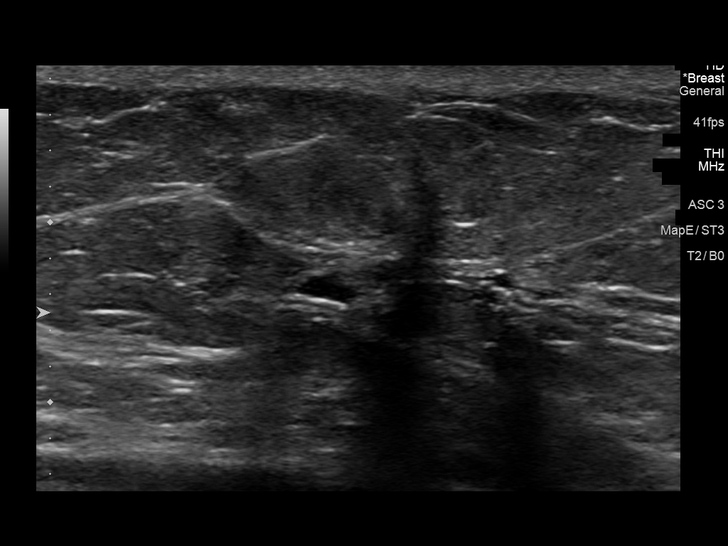
[im 7/10]
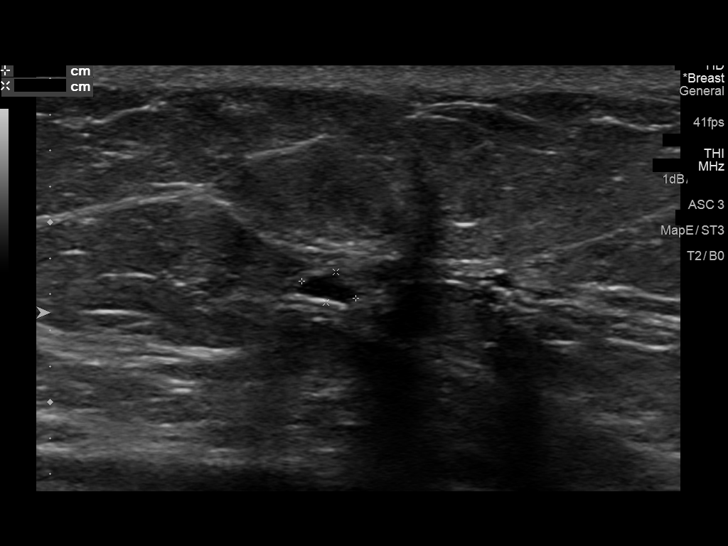
[im 8/10]
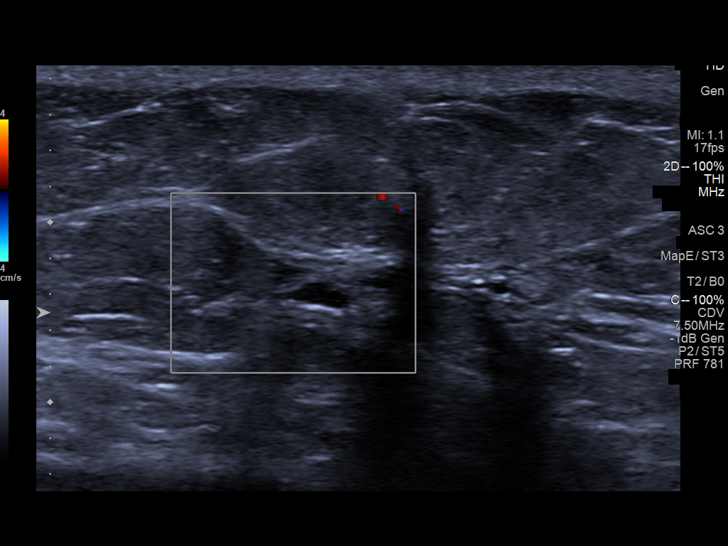
[im 9/10]
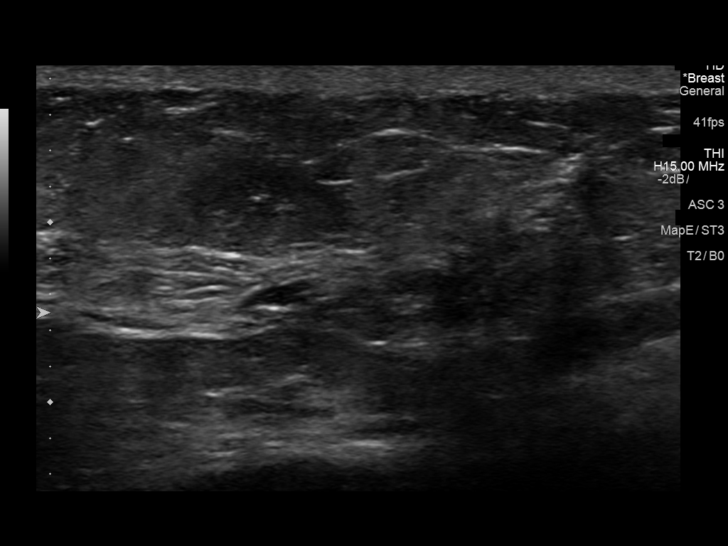
[im 10/10]
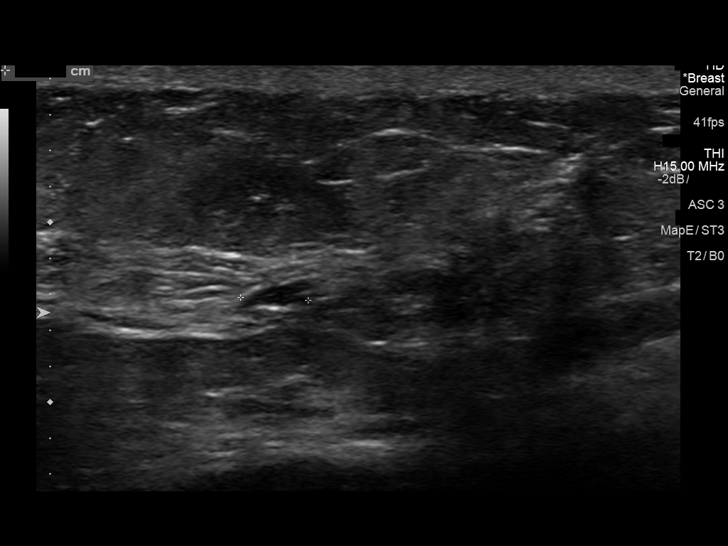

[10 of 10 positions shown; findings below may reference images not displayed]

ACR Breast Density Category c: The breast tissue is heterogeneously
dense, which may obscure small masses.
FINDINGS: Mammographically, there are no suspicious masses or areas of
architectural distortion. Diffusely scattered benign-appearing
calcifications are seen bilateral 2 circumscribed few mm nodules are
seen in the lower inner left breast, middle depth.

Mammographic images were processed with CAD.

On physical exam, no suspicious masses are palpated.

Targeted ultrasound is performed, showing left breast 6 o'clock 3 cm
from the nipple benign-appearing cyst measuring 3 mm. Similarly in
the left breast 7 o'clock 6 cm from the nipple there is a second
benign-appearing cyst measuring 4 mm. These findings likely
correspond to the mammographically seen nodules.
IMPRESSION: No mammographic or sonographic evidence of malignancy in either
breast.

RECOMMENDATION:
Further management of patient's left breast swelling should be based
on clinical grounds.

Otherwise, bilateral screening mammogram is recommended in 1 year.

I have discussed the findings and recommendations with the patient.
Results were also provided in writing at the conclusion of the
visit. If applicable, a reminder letter will be sent to the patient
regarding the next appointment.

BI-RADS CATEGORY  2: Benign.

## 2018-07-22 NOTE — Telephone Encounter (Signed)
OK to call this in for her

## 2018-08-14 ENCOUNTER — Encounter: Payer: Medicare HMO | Admitting: Family Medicine

## 2018-08-27 ENCOUNTER — Ambulatory Visit (INDEPENDENT_AMBULATORY_CARE_PROVIDER_SITE_OTHER): Payer: Medicare HMO

## 2018-08-27 ENCOUNTER — Encounter: Payer: Self-pay | Admitting: Family Medicine

## 2018-08-27 ENCOUNTER — Ambulatory Visit (INDEPENDENT_AMBULATORY_CARE_PROVIDER_SITE_OTHER): Payer: Medicare HMO | Admitting: Family Medicine

## 2018-08-27 VITALS — BP 148/60 | HR 56 | Ht 66.0 in | Wt 190.0 lb

## 2018-08-27 VITALS — BP 184/86 | HR 56 | Temp 98.0°F | Resp 16 | Ht 66.0 in | Wt 190.2 lb

## 2018-08-27 DIAGNOSIS — Z0000 Encounter for general adult medical examination without abnormal findings: Secondary | ICD-10-CM | POA: Diagnosis not present

## 2018-08-27 DIAGNOSIS — Z23 Encounter for immunization: Secondary | ICD-10-CM

## 2018-08-27 DIAGNOSIS — Z13220 Encounter for screening for lipoid disorders: Secondary | ICD-10-CM

## 2018-08-27 DIAGNOSIS — I10 Essential (primary) hypertension: Secondary | ICD-10-CM

## 2018-08-27 DIAGNOSIS — Z780 Asymptomatic menopausal state: Secondary | ICD-10-CM | POA: Diagnosis not present

## 2018-08-27 DIAGNOSIS — I480 Paroxysmal atrial fibrillation: Secondary | ICD-10-CM | POA: Diagnosis not present

## 2018-08-27 MED ORDER — LOSARTAN POTASSIUM 100 MG PO TABS: ORAL_TABLET | ORAL | 3 refills | Status: DC

## 2018-08-27 MED ORDER — RIVAROXABAN 20 MG PO TABS: ORAL_TABLET | ORAL | 3 refills | Status: DC

## 2018-08-27 MED ORDER — METOPROLOL SUCCINATE ER 50 MG PO TB24: ORAL_TABLET | ORAL | 3 refills | Status: DC

## 2018-08-27 NOTE — Patient Instructions (Signed)
[name] , Thank you for taking time to come for your Medicare Wellness Visit. I appreciate your ongoing commitment to your health goals. Please review the following plan we discussed and let me know if I can assist you in the future.   Screening recommendations/referrals: Colonoscopy: no longer required Mammogram: completed 02/2018 Bone Density: Please call [phone] to schedule.  Recommended yearly ophthalmology/optometry visit for glaucoma screening and checkup Recommended yearly dental visit for hygiene and checkup  Vaccinations: Influenza vaccine: done today  Pneumococcal vaccine: completed series Tdap vaccine: up to date Shingles vaccine: shingrix eligible, check with your insurance company for coverage.    Advanced directives: Please bring a copy of your health care power of attorney and living will to the office at your convenience.  Conditions/risks identified: Recommend drinking at least 6-8 glasses of water a day   Next appointment: Follow up in one year for your annual wellness exam.    Preventive Care 65 Years and Older, Female Preventive care refers to lifestyle choices and visits with your health care provider that can promote health and wellness. What does preventive care include?  A yearly physical exam. This is also called an annual well check.  Dental exams once or twice a year.  Routine eye exams. Ask your health care provider how often you should have your eyes checked.  Personal lifestyle choices, including:  Daily care of your teeth and gums.  Regular physical activity.  Eating a healthy diet.  Avoiding tobacco and drug use.  Limiting alcohol use.  Practicing safe sex.  Taking low-dose aspirin every day.  Taking vitamin and mineral supplements as recommended by your health care provider. What happens during an annual well check? The services and screenings done by your health care provider during your annual well check will depend on your  age, overall health, lifestyle risk factors, and family history of disease. Counseling  Your health care provider may ask you questions about your:  Alcohol use.  Tobacco use.  Drug use.  Emotional well-being.  Home and relationship well-being.  Sexual activity.  Eating habits.  History of falls.  Memory and ability to understand (cognition).  Work and work Statistician.  Reproductive health. Screening  You may have the following tests or measurements:  Height, weight, and BMI.  Blood pressure.  Lipid and cholesterol levels. These may be checked every 5 years, or more frequently if you are over 53 years old.  Skin check.  Lung cancer screening. You may have this screening every year starting at age 54 if you have a 30-pack-year history of smoking and currently smoke or have quit within the past 15 years.  Fecal occult blood test (FOBT) of the stool. You may have this test every year starting at age 85.  Flexible sigmoidoscopy or colonoscopy. You may have a sigmoidoscopy every 5 years or a colonoscopy every 10 years starting at age 37.  Hepatitis C blood test.  Hepatitis B blood test.  Sexually transmitted disease (STD) testing.  Diabetes screening. This is done by checking your blood sugar (glucose) after you have not eaten for a while (fasting). You may have this done every 1-3 years.  Bone density scan. This is done to screen for osteoporosis. You may have this done starting at age 27.  Mammogram. This may be done every 1-2 years. Talk to your health care provider about how often you should have regular mammograms. Talk with your health care provider about your test results, treatment options, and if necessary,  the need for more tests. Vaccines  Your health care provider may recommend certain vaccines, such as:  Influenza vaccine. This is recommended every year.  Tetanus, diphtheria, and acellular pertussis (Tdap, Td) vaccine. You may need a Td booster  every 10 years.  Zoster vaccine. You may need this after age 46.  Pneumococcal 13-valent conjugate (PCV13) vaccine. One dose is recommended after age 33.  Pneumococcal polysaccharide (PPSV23) vaccine. One dose is recommended after age 23. Talk to your health care provider about which screenings and vaccines you need and how often you need them. This information is not intended to replace advice given to you by your health care provider. Make sure you discuss any questions you have with your health care provider. Document Released: 11/17/2015 Document Revised: 07/10/2016 Document Reviewed: 08/22/2015 Elsevier Interactive Patient Education  2017 [place] Prevention in the Home Falls can cause injuries. They can happen to people of all ages. There are many things you can do to make your home safe and to help prevent falls. What can I do on the outside of my home?  Regularly fix the edges of walkways and driveways and fix any cracks.  Remove anything that might make you trip as you walk through a door, such as a raised step or threshold.  Trim any bushes or trees on the path to your home.  Use bright outdoor lighting.  Clear any walking paths of anything that might make someone trip, such as rocks or tools.  Regularly check to see if handrails are loose or broken. Make sure that both sides of any steps have handrails.  Any raised decks and porches should have guardrails on the edges.  Have any leaves, snow, or ice cleared regularly.  Use sand or salt on walking paths during winter.  Clean up any spills in your garage right away. This includes oil or grease spills. What can I do in the bathroom?  Use night lights.  Install grab bars by the toilet and in the tub and shower. Do not use towel bars as grab bars.  Use non-skid mats or decals in the tub or shower.  If you need to sit down in the shower, use a plastic, non-slip stool.  Keep the floor dry. Clean up any  water that spills on the floor as soon as it happens.  Remove soap buildup in the tub or shower regularly.  Attach bath mats securely with double-sided non-slip rug tape.  Do not have throw rugs and other things on the floor that can make you trip. What can I do in the bedroom?  Use night lights.  Make sure that you have a light by your bed that is easy to reach.  Do not use any sheets or blankets that are too big for your bed. They should not hang down onto the floor.  Have a firm chair that has side arms. You can use this for support while you get dressed.  Do not have throw rugs and other things on the floor that can make you trip. What can I do in the kitchen?  Clean up any spills right away.  Avoid walking on wet floors.  Keep items that you use a lot in easy-to-reach places.  If you need to reach something above you, use a strong step stool that has a grab bar.  Keep electrical cords out of the way.  Do not use floor polish or wax that makes floors slippery. If you must use  wax, use non-skid floor wax.  Do not have throw rugs and other things on the floor that can make you trip. What can I do with my stairs?  Do not leave any items on the stairs.  Make sure that there are handrails on both sides of the stairs and use them. Fix handrails that are broken or loose. Make sure that handrails are as long as the stairways.  Check any carpeting to make sure that it is firmly attached to the stairs. Fix any carpet that is loose or worn.  Avoid having throw rugs at the top or bottom of the stairs. If you do have throw rugs, attach them to the floor with carpet tape.  Make sure that you have a light switch at the top of the stairs and the bottom of the stairs. If you do not have them, ask someone to add them for you. What else can I do to help prevent falls?  Wear shoes that:  Do not have high heels.  Have rubber bottoms.  Are comfortable and fit you well.  Are closed  at the toe. Do not wear sandals.  If you use a stepladder:  Make sure that it is fully opened. Do not climb a closed stepladder.  Make sure that both sides of the stepladder are locked into place.  Ask someone to hold it for you, if possible.  Clearly mark and make sure that you can see:  Any grab bars or handrails.  First and last steps.  Where the edge of each step is.  Use tools that help you move around (mobility aids) if they are needed. These include:  Canes.  Walkers.  Scooters.  Crutches.  Turn on the lights when you go into a dark area. Replace any light bulbs as soon as they burn out.  Set up your furniture so you have a clear path. Avoid moving your furniture around.  If any of your floors are uneven, fix them.  If there are any pets around you, be aware of where they are.  Review your medicines with your doctor. Some medicines can make you feel dizzy. This can increase your chance of falling. Ask your doctor what other things that you can do to help prevent falls. This information is not intended to replace advice given to you by your health care provider. Make sure you discuss any questions you have with your health care provider. Document Released: 08/17/2009 Document Revised: 03/28/2016 Document Reviewed: 11/25/2014 Elsevier Interactive Patient Education  2017 [place].  Influenza (Flu) Vaccine (Inactivated or Recombinant): What You Need to Know 1. Why get vaccinated? Influenza ("flu") is a contagious disease that spreads around the [place] every year, usually between October and May. Flu is caused by influenza viruses, and is spread mainly by coughing, sneezing, and close contact. Anyone can get flu. Flu strikes suddenly and can last several days. Symptoms vary by age, but can include:  fever/chills  sore throat  muscle aches  fatigue  cough  headache  runny or stuffy nose  Flu can also lead to pneumonia and blood infections,  and cause diarrhea and seizures in children. If you have a medical condition, such as heart or lung disease, flu can make it worse. Flu is more dangerous for some people. Infants and young children, people 60 years of age and older, pregnant women, and people with certain health conditions or a weakened immune system are at greatest risk. Each year thousands of people in  the [place] die from flu, and many more are hospitalized. Flu vaccine can:  keep you from getting flu,  make flu less severe if you do get it, and  keep you from spreading flu to your family and other people. 2. Inactivated and recombinant flu vaccines A dose of flu vaccine is recommended every flu season. Children 6 months through 78 years of age may need two doses during the same flu season. Everyone else needs only one dose each flu season. Some inactivated flu vaccines contain a very small amount of a mercury-based preservative called thimerosal. Studies have not shown thimerosal in vaccines to be harmful, but flu vaccines that do not contain thimerosal are available. There is no live flu virus in flu shots. They cannot cause the flu. There are many flu viruses, and they are always changing. Each year a new flu vaccine is made to protect against three or four viruses that are likely to cause disease in the upcoming flu season. But even when the vaccine doesn't exactly match these viruses, it may still provide some protection. Flu vaccine cannot prevent:  flu that is caused by a virus not covered by the vaccine, or  illnesses that look like flu but are not.  It takes about 2 weeks for protection to develop after vaccination, and protection lasts through the flu season. 3. Some people should not get this vaccine Tell the person who is giving you the vaccine:  If you have any severe, life-threatening allergies. If you ever had a life-threatening allergic reaction after a dose of flu vaccine, or have a severe allergy to  any part of this vaccine, you may be advised not to get vaccinated. Most, but not all, types of flu vaccine contain a small amount of egg protein.  If you ever had Guillain-Barr Syndrome (also called GBS). Some people with a history of GBS should not get this vaccine. This should be discussed with your doctor.  If you are not feeling well. It is usually okay to get flu vaccine when you have a mild illness, but you might be asked to come back when you feel better.  4. Risks of a vaccine reaction With any medicine, including vaccines, there is a chance of reactions. These are usually mild and go away on their own, but serious reactions are also possible. Most people who get a flu shot do not have any problems with it. Abend problems following a flu shot include:  soreness, redness, or swelling where the shot was given  hoarseness  sore, red or itchy eyes  cough  fever  aches  headache  itching  fatigue  If these problems occur, they usually begin soon after the shot and last 1 or 2 days. More serious problems following a flu shot can include the following:  There may be a small increased risk of Guillain-Barre Syndrome (GBS) after inactivated flu vaccine. This risk has been estimated at 1 or 2 additional cases per million people vaccinated. This is much lower than the risk of severe complications from flu, which can be prevented by flu vaccine.  Young children who get the flu shot along with pneumococcal vaccine (PCV13) and/or DTaP vaccine at the same time might be slightly more likely to have a seizure caused by fever. Ask your doctor for more information. Tell your doctor if a child who is getting flu vaccine has ever had a seizure.  Problems that could happen after any injected vaccine:  People sometimes faint after  a medical procedure, including vaccination. Sitting or lying down for about 15 minutes can help prevent fainting, and injuries caused by a fall. Tell your doctor  if you feel dizzy, or have vision changes or ringing in the ears.  Some people get severe pain in the shoulder and have difficulty moving the arm where a shot was given. This happens very rarely.  Any medication can cause a severe allergic reaction. Such reactions from a vaccine are very rare, estimated at about 1 in a million doses, and would happen within a few minutes to a few hours after the vaccination. As with any medicine, there is a very remote chance of a vaccine causing a serious injury or death. The safety of vaccines is always being monitored. For more information, visit: http://www.aguilar.org/ 5. What if there is a serious reaction? What should I look for? Look for anything that concerns you, such as signs of a severe allergic reaction, very high fever, or unusual behavior. Signs of a severe allergic reaction can include hives, swelling of the face and throat, difficulty breathing, a fast heartbeat, dizziness, and weakness. These would start a few minutes to a few hours after the vaccination. What should I do?  If you think it is a severe allergic reaction or other emergency that can't wait, call 9-1-1 and get the person to the nearest hospital. Otherwise, call your doctor.  Reactions should be reported to the Vaccine Adverse Event Reporting System (VAERS). Your doctor should file this report, or you can do it yourself through the VAERS web site at www.vaers.SamedayNews.es, or by calling 678 595 1948. ? VAERS does not give medical advice. 6. The National Vaccine Injury Compensation Program The [employer] Vaccine Injury Compensation Program (VICP) is a federal program that was created to compensate people who may have been injured by certain vaccines. Persons who believe they may have been injured by a vaccine can learn about the program and about filing a claim by calling 234-662-0432 or visiting the [employer] website at GoldCloset.com.ee. There is a time limit to file a  claim for compensation. 7. How can I learn more?  Ask your healthcare provider. He or she can give you the vaccine package insert or suggest other sources of information.  Call your local or state health department.  Contact the Centers for Disease Control and Prevention (CDC): ? Call (724)112-1342 (1-800-CDC-INFO) or ? Visit CDC's website at https://gibson.com/ Vaccine Information Statement, Inactivated Influenza Vaccine (06/10/2014) This information is not intended to replace advice given to you by your health care provider. Make sure you discuss any questions you have with your health care provider. Document Released: 08/15/2006 Document Revised: 07/11/2016 Document Reviewed: 07/11/2016 Elsevier Interactive Patient Education  2017 [employer].

## 2018-08-27 NOTE — Assessment & Plan Note (Signed)
Better on recheck. Continues to be high. Will check labs and refills of her medicine given today. Call with any concerns.

## 2018-08-27 NOTE — Progress Notes (Signed)
BP (!) 148/60 (BP Location: Left Arm, Cuff Size: Normal)   Pulse (!) 56   Ht 5\' 6"  (1.676 m)   Wt 190 lb (86.2 kg)   BMI 30.67 kg/m    Subjective:    Patient ID: [name], female    DOB: January 11, 1941, 77 y.o.   MRN: [id number]  HPI: [name] is a 77 y.o. female presenting on 08/27/2018 for comprehensive medical examination. Current medical complaints include:  HYPERTENSION Hypertension status: stable  Satisfied with current treatment? no Duration of hypertension: chronic BP monitoring frequency:  not checking BP medication side effects:  no Medication compliance: excellent compliance Previous BP meds: metoprolol, losartan Aspirin: no Recurrent headaches: no Visual changes: no Palpitations: no Dyspnea: no Chest pain: no Lower extremity edema: no Dizzy/lightheaded: no  ATRIAL FIBRILLATION Atrial fibrillation status: stable Satisfied with current treatment: yes  Medication side effects:  no Medication compliance: excellent compliance Palpitations:  no Chest pain:  no Dyspnea on exertion:  no Orthopnea:  no Syncope:  no Edema:  no Ventricular rate control: B-blocker Anti-coagulation: long acting  Menopausal Symptoms: no  Depression Screen done today and results listed below:  Depression screen [facility] 2/9 08/27/2018 08/22/2017 08/06/2016 04/11/2015  Decreased Interest 0 0 0 0  Down, Depressed, Hopeless 0 - 0 0  PHQ - 2 Score 0 0 0 0   Past Medical History:  Past Medical History:  Diagnosis Date  . Cat bite of right lower leg [id number]  . Cellulitis of lower leg 12/07/2014  . Hypertension     Surgical History:  History reviewed. No pertinent surgical history.  Medications:  Current Outpatient Medications on File Prior to Visit  Medication Sig  . diclofenac sodium (VOLTAREN) 1 % GEL Apply 4 g topically 4 (four) times daily. (Patient not taking: Reported on 08/22/2017)  . furosemide (LASIX) 20 MG tablet Take 20 mg by mouth.  . potassium chloride (KLOR-CON)  20 MEQ packet Take 20 mEq by mouth 2 (two) times daily.   No current facility-administered medications on file prior to visit.     Allergies:  Allergies  Allergen Reactions  . Amlodipine     Leg swelling  . Hctz [Hydrochlorothiazide] Nausea Only and Rash    Social History:  Social History   Socioeconomic History  . Marital status: Widowed    Spouse name: Not on file  . Number of children: Not on file  . Years of education: Not on file  . Highest education level: High school graduate  Occupational History  . Occupation: retired   Scientific laboratory technician  . Financial resource strain: Not hard at all  . Food insecurity:    Worry: Never true    Inability: Never true  . Transportation needs:    Medical: No    Non-medical: No  Tobacco Use  . Smoking status: Former Smoker    Last attempt to quit: 11/04/1978    Years since quitting: 39.8  . Smokeless tobacco: Never Used  Substance and Sexual Activity  . Alcohol use: No  . Drug use: No  . Sexual activity: Never  Lifestyle  . Physical activity:    Days per week: 0 days    Minutes per session: 0 min  . Stress: Not at all  Relationships  . Social connections:    Talks on phone: Once a week    Gets together: Once a week    Attends religious service: More than 4 times per year    Active member of club  or organization: No    Attends meetings of clubs or organizations: Never    Relationship status: Widowed  . Intimate partner violence:    Fear of current or ex partner: No    Emotionally abused: No    Physically abused: No    Forced sexual activity: No  Other Topics Concern  . Not on file  Social History Narrative  . Not on file   Social History   Tobacco Use  Smoking Status Former Smoker  . Last attempt to quit: 11/04/1978  . Years since quitting: 39.8  Smokeless Tobacco Never Used   Social History   Substance and Sexual Activity  Alcohol Use No    Family History:  Family History  Problem Relation Age of Onset  .  Hypertension Mother   . Alcohol abuse Father   . Heart disease Father   . Hypertension Father   . Hyperlipidemia Sister   . Hypertension Sister   . Alcohol abuse Brother   . Hypertension Brother     Past medical history, surgical history, medications, allergies, family history and social history reviewed with patient today and changes made to appropriate areas of the chart.   Review of Systems  Constitutional: Negative.   HENT: Negative.   Eyes: Negative.   Respiratory: Negative.   Cardiovascular: Negative.   Gastrointestinal: Negative.   Genitourinary: Negative.   Musculoskeletal: Negative.   Skin: Negative.   Neurological: Negative.   Endo/Heme/Allergies: Negative.   Psychiatric/Behavioral: Negative.     All other ROS negative except what is listed above and in the HPI.      Objective:    BP (!) 148/60 (BP Location: Left Arm, Cuff Size: Normal)   Pulse (!) 56   Ht 5\' 6"  (1.676 m)   Wt 190 lb (86.2 kg)   BMI 30.67 kg/m   Wt Readings from Last 3 Encounters:  08/27/18 190 lb (86.2 kg)  08/27/18 190 lb 3.2 oz (86.3 kg)  02/10/18 198 lb 3 oz (89.9 kg)    Physical Exam  Constitutional: She is oriented to person, place, and time. She appears well-developed and well-nourished. No distress.  HENT:  Head: Normocephalic and atraumatic.  Right Ear: Hearing and external ear normal.  Left Ear: Hearing and external ear normal.  Nose: Nose normal.  Mouth/Throat: Oropharynx is clear and moist. No oropharyngeal exudate.  Eyes: Pupils are equal, round, and reactive to light. Conjunctivae, EOM and lids are normal. Right eye exhibits no discharge. Left eye exhibits no discharge. No scleral icterus.  Neck: Normal range of motion. Neck supple. No JVD present. No tracheal deviation present. No thyromegaly present.  Cardiovascular: Normal rate, regular rhythm, normal heart sounds and intact distal pulses. Exam reveals no gallop and no friction rub.  No murmur heard. Pulmonary/Chest:  Effort normal and breath sounds normal. No stridor. No respiratory distress. She has no wheezes. She has no rales. She exhibits no tenderness.  Abdominal: Soft. Bowel sounds are normal. She exhibits no distension and no mass. There is no tenderness. There is no rebound and no guarding. No hernia.  Musculoskeletal: Normal range of motion. She exhibits no edema, tenderness or deformity.  Lymphadenopathy:    She has no cervical adenopathy.  Neurological: She is alert and oriented to person, place, and time. She displays normal reflexes. No cranial nerve deficit or sensory deficit. She exhibits normal muscle tone. Coordination normal.  Skin: Skin is warm, dry and intact. Capillary refill takes less than 2 seconds. No rash noted. She is  not diaphoretic. No erythema. No pallor.  Psychiatric: She has a normal mood and affect. Her speech is normal and behavior is normal. Judgment and thought content normal. Cognition and memory are normal.  Nursing note and vitals reviewed.   Results for orders placed or performed in visit on 44/31/54  Basic metabolic panel  Result Value Ref Range   Glucose 100 (H) 65 - 99 mg/dL   BUN 21 8 - 27 mg/dL   Creatinine, Ser 0.70 0.57 - 1.00 mg/dL   GFR calc non Af Amer 84 >59 mL/min/1.73   GFR calc Af Amer 97 >59 mL/min/1.73   BUN/Creatinine Ratio 30 (H) 12 - 28   Sodium 142 134 - 144 mmol/L   Potassium 4.6 3.5 - 5.2 mmol/L   Chloride 103 96 - 106 mmol/L   CO2 23 20 - 29 mmol/L   Calcium 9.2 8.7 - 10.3 mg/dL  CBC with Differential/Platelet  Result Value Ref Range   WBC 6.2 3.4 - 10.8 x10E3/uL   RBC 4.43 3.77 - 5.28 x10E6/uL   Hemoglobin 13.4 11.1 - 15.9 g/dL   Hematocrit 41.5 34.0 - 46.6 %   MCV 94 79 - 97 fL   MCH 30.2 26.6 - 33.0 pg   MCHC 32.3 31.5 - 35.7 g/dL   RDW 14.0 12.3 - 15.4 %   Platelets 289 150 - 379 x10E3/uL   Neutrophils 63 Not Estab. %   Lymphs 24 Not Estab. %   Monocytes 9 Not Estab. %   Eos 4 Not Estab. %   Basos 0 Not Estab. %    Neutrophils Absolute 3.9 1.4 - 7.0 x10E3/uL   Lymphocytes Absolute 1.5 0.7 - 3.1 x10E3/uL   Monocytes Absolute 0.5 0.1 - 0.9 x10E3/uL   EOS (ABSOLUTE) 0.2 0.0 - 0.4 x10E3/uL   Basophils Absolute 0.0 0.0 - 0.2 x10E3/uL   Immature Granulocytes 0 Not Estab. %   Immature Grans (Abs) 0.0 0.0 - 0.1 x10E3/uL      Assessment & Plan:   Problem List Items Addressed This Visit      Cardiovascular and Mediastinum   HTN (hypertension)    Better on recheck. Continues to be high. Will check labs and refills of her medicine given today. Call with any concerns.       Relevant Medications   losartan (COZAAR) 100 MG tablet   metoprolol succinate (TOPROL-XL) 50 MG 24 hr tablet   rivaroxaban (XARELTO) 20 MG TABS tablet   Other Relevant Orders   CBC with Differential/Platelet   Comprehensive metabolic panel   Microalbumin, Urine Waived   TSH   UA/M w/rflx Culture, Routine   Atrial fibrillation (HCC)    Stable on her metoprolol. Continue xarelto. Will check labs. Will get her back to see cardiology. Call with any concerns.       Relevant Medications   losartan (COZAAR) 100 MG tablet   metoprolol succinate (TOPROL-XL) 50 MG 24 hr tablet   rivaroxaban (XARELTO) 20 MG TABS tablet   Other Relevant Orders   CBC with Differential/Platelet   Comprehensive metabolic panel   TSH   UA/M w/rflx Culture, Routine    Other Visit Diagnoses    Routine general medical examination at a health care facility    -  Primary   Vaccines up to date. Screening labs checked today. Pap and colonoscopy N/A. Mammogram up to date. DEXA ordered today. Call with any concerns.    Screening for cholesterol level       Checking labs today. Await results.  Relevant Orders   Lipid Panel w/o Chol/HDL Ratio       Follow up plan: Return in about 6 months (around 02/26/2019) for Follow up.   LABORATORY TESTING:  - Pap smear: not applicable  IMMUNIZATIONS:   - Tdap: Tetanus vaccination status reviewed: last tetanus  booster within 10 years. - Influenza: Administered today - Pneumovax: Up to date - Prevnar: Up to date  SCREENING: -Mammogram: Up to date  - Colonoscopy: Not applicable  - Bone Density: Ordered today   PATIENT COUNSELING:   Advised to take 1 mg of folate supplement per day if capable of pregnancy.   Sexuality: Discussed sexually transmitted diseases, partner selection, use of condoms, avoidance of unintended pregnancy  and contraceptive alternatives.   Advised to avoid cigarette smoking.  I discussed with the patient that most people either abstain from alcohol or drink within safe limits (<=14/week and <=4 drinks/occasion for males, <=7/weeks and <= 3 drinks/occasion for females) and that the risk for alcohol disorders and other health effects rises proportionally with the number of drinks per week and how often a drinker exceeds daily limits.  Discussed cessation/primary prevention of drug use and availability of treatment for abuse.   Diet: Encouraged to adjust caloric intake to maintain  or achieve ideal body weight, to reduce intake of dietary saturated fat and total fat, to limit sodium intake by avoiding high sodium foods and not adding table salt, and to maintain adequate dietary potassium and calcium preferably from fresh fruits, vegetables, and low-fat dairy products.    stressed the importance of regular exercise  Injury prevention: Discussed safety belts, safety helmets, smoke detector, smoking near bedding or upholstery.   Dental health: Discussed importance of regular tooth brushing, flossing, and dental visits.    NEXT PREVENTATIVE PHYSICAL DUE IN 1 YEAR. Return in about 6 months (around 02/26/2019) for Follow up.

## 2018-08-27 NOTE — Progress Notes (Signed)
Subjective:   [name] is a 77 y.o. female who presents for Medicare Annual (Subsequent) preventive examination.  Review of Systems:  Cardiac Risk Factors include: advanced age (>41men, >33 women);hypertension;obesity (BMI >30kg/m2)     Objective:     Vitals: BP (!) 184/86 (BP Location: Left Arm, Patient Position: Sitting, Cuff Size: Normal)   Pulse (!) 56   Temp 98 F (36.7 C) (Temporal)   Resp 16   Ht 5\' 6"  (1.676 m)   Wt 190 lb 3.2 oz (86.3 kg)   BMI 30.70 kg/m   Body mass index is 30.7 kg/m.  Advanced Directives 08/27/2018 08/22/2017 01/16/2017 10/14/2016 08/06/2016  Does Patient Have a Medical Advance Directive? No No No No No  Would patient like information on creating a medical advance directive? Yes (MAU/Ambulatory/Procedural Areas - Information given) Yes (MAU/Ambulatory/Procedural Areas - Information given) - - Yes - Educational materials given    Tobacco Social History   Tobacco Use  Smoking Status Former Smoker  . Last attempt to quit: 11/04/1978  . Years since quitting: 39.8  Smokeless Tobacco Never Used     Counseling given: Not Answered   Clinical Intake:  Pre-visit preparation completed: Yes  Pain : No/denies pain     Nutritional Status: BMI > 30  Obese Nutritional Risks: None Diabetes: No  How often do you need to have someone help you when you read instructions, pamphlets, or other written materials from your doctor or pharmacy?: 1 - Never What is the last grade level you completed in school?: 12th grade   Interpreter Needed?: No  Information entered by :: TIffany HIll,LPN   Past Medical History:  Diagnosis Date  . Cat bite of right lower leg [id number]  . Cellulitis of lower leg 12/07/2014  . Hypertension    History reviewed. No pertinent surgical history. Family History  Problem Relation Age of Onset  . Hypertension Mother   . Alcohol abuse Father   . Heart disease Father   . Hypertension Father   . Hyperlipidemia Sister   .  Hypertension Sister   . Alcohol abuse Brother   . Hypertension Brother    Social History   Socioeconomic History  . Marital status: Widowed    Spouse name: Not on file  . Number of children: Not on file  . Years of education: Not on file  . Highest education level: High school graduate  Occupational History  . Occupation: retired   Scientific laboratory technician  . Financial resource strain: Not hard at all  . Food insecurity:    Worry: Never true    Inability: Never true  . Transportation needs:    Medical: No    Non-medical: No  Tobacco Use  . Smoking status: Former Smoker    Last attempt to quit: 11/04/1978    Years since quitting: 39.8  . Smokeless tobacco: Never Used  Substance and Sexual Activity  . Alcohol use: No  . Drug use: No  . Sexual activity: Never  Lifestyle  . Physical activity:    Days per week: 0 days    Minutes per session: 0 min  . Stress: Not at all  Relationships  . Social connections:    Talks on phone: Once a week    Gets together: Once a week    Attends religious service: More than 4 times per year    Active member of club or organization: No    Attends meetings of clubs or organizations: Never    Relationship status:  Widowed  Other Topics Concern  . Not on file  Social History Narrative  . Not on file    Outpatient Encounter Medications as of 08/27/2018  Medication Sig  . losartan (COZAAR) 100 MG tablet TAKE 1 TABLET(100 MG) BY MOUTH DAILY  . metoprolol succinate (TOPROL-XL) 50 MG 24 hr tablet TAKE 1 TABLET BY MOUTH DAILY. TAKE WITH OR IMMEDIATELY FOLLOWING A MEAL  . XARELTO 20 MG TABS tablet TAKE 1 TABLET BY MOUTH EVERY DAY WITH DINNER  . diclofenac sodium (VOLTAREN) 1 % GEL Apply 4 g topically 4 (four) times daily. (Patient not taking: Reported on 08/22/2017)  . furosemide (LASIX) 20 MG tablet Take 20 mg by mouth.  . potassium chloride (KLOR-CON) 20 MEQ packet Take 20 mEq by mouth 2 (two) times daily.   No facility-administered encounter medications  on file as of 08/27/2018.     Activities of Daily Living In your present state of health, do you have any difficulty performing the following activities: 08/27/2018  Hearing? N  Comment declines hearing aids   Vision? N  Comment wears glasses   Difficulty concentrating or making decisions? N  Walking or climbing stairs? N  Dressing or bathing? N  Doing errands, shopping? N  Preparing Food and eating ? N  Using the Toilet? N  In the past six months, have you accidently leaked urine? N  Do you have problems with loss of bowel control? N  Managing your Medications? N  Managing your Finances? N  Housekeeping or managing your Housekeeping? N  Some recent data might be hidden    Patient Care Team: [name], DO as PCP - General (Family Medicine) Minna Merritts, MD as Consulting Physician (Cardiology)    Assessment:   This is a routine wellness examination for [facility].  Exercise Activities and Dietary recommendations Current Exercise Habits: The patient does not participate in regular exercise at present, Exercise limited by: None identified  Goals    . DIET - INCREASE WATER INTAKE     Recommend drinking at least 6-8 glasses of water a day     . Exercise 150 minutes per week (moderate activity)     Mild exercising/stretching discussed       Fall Risk Fall Risk  08/27/2018 08/22/2017 08/06/2016 04/11/2015  Falls in the past year? No No No No   FALL RISK PREVENTION PERTAINING TO THE HOME:  Any stairs in or around the home WITH handrails? No  Home free of loose throw rugs in walkways, pet beds, electrical cords, etc? Yes  Adequate lighting in your home to reduce risk of falls? Yes   ASSISTIVE DEVICES UTILIZED TO PREVENT FALLS:  Life alert? No  Use of a cane, walker or w/c? No  Grab bars in the bathroom? No  Shower chair or bench in shower? No  Elevated toilet seat or a handicapped toilet? No   DME ORDERS:  DME order needed?  No   TIMED UP AND GO:  Was the  test performed? Yes .  Length of time to ambulate 10 feet: 8 sec.   GAIT:  Appearance of gait: Gait steady-fast without the use of an assistive device Education: Fall risk prevention has been discussed.  Intervention(s) required? No    Depression Screen PHQ 2/9 Scores 08/27/2018 08/22/2017 08/06/2016 04/11/2015  PHQ - 2 Score 0 0 0 0     Cognitive Function     6CIT Screen 08/27/2018 08/22/2017  What Year? 0 points 0 points  What month? 0  points 0 points  What time? 0 points 0 points  Count back from 20 0 points 0 points  Months in reverse 0 points 0 points  Repeat phrase 2 points 0 points  Total Score 2 0    Immunization History  Administered Date(s) Administered  . Influenza, High Dose Seasonal PF 08/06/2016, 08/22/2017, 08/27/2018  . Influenza-Unspecified 12/19/2015  . Pneumococcal Conjugate-13 08/06/2016  . Pneumococcal Polysaccharide-23 08/22/2017  . Td 12/07/2014    Qualifies for Shingles Vaccine?Yes   Due for Shingrix. Education has been provided regarding the importance of this vaccine. Pt has been advised to call insurance company to determine out of pocket expense. Advised may also receive vaccine at local pharmacy or Health Dept. Verbalized acceptance and understanding.  Tdap: completed 12/07/2014.  Flu Vaccine: Due for Flu vaccine. Does the patient want to receive this vaccine today?  Yes .   Pneumococcal Vaccine: completed series  Screening Tests Health Maintenance  Topic Date Due  . DEXA SCAN  04/04/2006  . INFLUENZA VACCINE  06/04/2018  . TETANUS/TDAP  12/07/2024  . PNA vac Low Risk Adult  Completed    Cancer Screenings:  Colorectal Screening: No longer required.   Mammogram: Completed 02/17/2018. Repeat every year  Bone Density: ordered  Lung Cancer Screening: (Low Dose CT Chest recommended if Age 34-80 years, 30 pack-year currently smoking OR have quit w/in 15years.) does not qualify.    Additional Screening:  Hepatitis C Screening: does  not qualify  Vision Screening: Recommended annual ophthalmology exams for early detection of glaucoma and other disorders of the eye. Is the patient up to date with their annual eye exam?  Yes  Who is the provider or what is the name of the office in which the pt attends annual eye exams? [facility]   [facility] Screening: Recommended annual dental exams for proper oral hygiene  Community Resource Referral:  CRR required this visit?  No     Plan:    I have personally reviewed and addressed the Medicare Annual Wellness questionnaire and have noted the following in the patient's chart:  A. Medical and social history B. Use of alcohol, tobacco or illicit drugs  C. Current medications and supplements D. Functional ability and status E.  Nutritional status F.  Physical activity G. Advance directives H. List of other physicians I.  Hospitalizations, surgeries, and ER visits in previous 12 months J.  [place] such as hearing and vision if needed, cognitive and depression L. Referrals and appointments   In addition, I have reviewed and discussed with patient certain preventive protocols, quality metrics, and best practice recommendations. A written personalized care plan for preventive services as well as general preventive health recommendations were provided to patient.   Signed,  Tyler Aas, LPN Nurse Health Advisor   Nurse Notes:none

## 2018-08-27 NOTE — Assessment & Plan Note (Addendum)
Stable on her metoprolol. Continue xarelto. Will check labs. Will get her back to see cardiology. Call with any concerns.

## 2018-08-28 ENCOUNTER — Encounter: Payer: Self-pay | Admitting: Family Medicine

## 2018-08-28 LAB — LIPID PANEL W/O CHOL/HDL RATIO
Cholesterol, Total: 136 mg/dL (ref 100–199)
HDL: 60 mg/dL (ref >39)
LDL Calculated: 67 mg/dL (ref 0–99)
Triglycerides: 47 mg/dL (ref 0–149)
VLDL CHOLESTEROL CAL: 9 mg/dL (ref 5–40)

## 2018-08-28 LAB — COMPREHENSIVE METABOLIC PANEL
ALT: 12 IU/L (ref 0–32)
AST: 16 IU/L (ref 0–40)
Albumin/Globulin Ratio: 1.7 (ref 1.2–2.2)
Albumin: 4 g/dL (ref 3.5–4.8)
Alkaline Phosphatase: 87 IU/L (ref 39–117)
BILIRUBIN TOTAL: 0.3 mg/dL (ref 0.0–1.2)
BUN/Creatinine Ratio: 31 — ABNORMAL HIGH (ref 12–28)
BUN: 19 mg/dL (ref 8–27)
CHLORIDE: 105 mmol/L (ref 96–106)
CO2: 23 mmol/L (ref 20–29)
Calcium: 9.2 mg/dL (ref 8.7–10.3)
Creatinine, Ser: 0.62 mg/dL (ref 0.57–1.00)
GFR calc non Af Amer: 87 mL/min/{1.73_m2} (ref >59)
GFR, EST AFRICAN AMERICAN: 101 mL/min/{1.73_m2} (ref >59)
GLUCOSE: 79 mg/dL (ref 65–99)
Globulin, Total: 2.3 g/dL (ref 1.5–4.5)
Potassium: 4.4 mmol/L (ref 3.5–5.2)
Sodium: 142 mmol/L (ref 134–144)
TOTAL PROTEIN: 6.3 g/dL (ref 6.0–8.5)

## 2018-08-28 LAB — MICROALBUMIN, URINE WAIVED
Creatinine, Urine Waived: 50 mg/dL (ref 10–300)
Microalb, Ur Waived: 10 mg/L (ref 0–19)
Microalb/Creat Ratio: <30 mg/g (ref <30)

## 2018-08-28 LAB — UA/M W/RFLX CULTURE, ROUTINE
BILIRUBIN UA: NEGATIVE
GLUCOSE, UA: NEGATIVE
Ketones, UA: NEGATIVE
NITRITE UA: NEGATIVE
PH UA: 5.5 (ref 5.0–7.5)
Protein, UA: NEGATIVE
Specific Gravity, UA: 1.015 (ref 1.005–1.030)
UUROB: 1 mg/dL (ref 0.2–1.0)

## 2018-08-28 LAB — CBC WITH DIFFERENTIAL/PLATELET
BASOS ABS: 0 10*3/uL (ref 0.0–0.2)
Basos: 1 %
EOS (ABSOLUTE): 0.2 10*3/uL (ref 0.0–0.4)
Eos: 3 %
Hematocrit: 38.1 % (ref 34.0–46.6)
Hemoglobin: 12.6 g/dL (ref 11.1–15.9)
IMMATURE GRANS (ABS): 0 10*3/uL (ref 0.0–0.1)
Immature Granulocytes: 0 %
LYMPHS: 23 %
Lymphocytes Absolute: 1.4 10*3/uL (ref 0.7–3.1)
MCH: 31.1 pg (ref 26.6–33.0)
MCHC: 33.1 g/dL (ref 31.5–35.7)
MCV: 94 fL (ref 79–97)
Monocytes Absolute: 0.5 10*3/uL (ref 0.1–0.9)
Monocytes: 8 %
NEUTROS ABS: 3.9 10*3/uL (ref 1.4–7.0)
NEUTROS PCT: 65 %
PLATELETS: 315 10*3/uL (ref 150–450)
RBC: 4.05 x10E6/uL (ref 3.77–5.28)
RDW: 12.8 % (ref 12.3–15.4)
WBC: 6 10*3/uL (ref 3.4–10.8)

## 2018-08-28 LAB — MICROSCOPIC EXAMINATION

## 2018-08-28 LAB — TSH: TSH: 1.14 u[IU]/mL (ref 0.450–4.500)

## 2018-08-30 ENCOUNTER — Encounter: Payer: Self-pay | Admitting: Emergency Medicine

## 2018-08-30 ENCOUNTER — Other Ambulatory Visit: Payer: Self-pay

## 2018-08-30 DIAGNOSIS — I83899 Varicose veins of unspecified lower extremities with other complications: Secondary | ICD-10-CM | POA: Insufficient documentation

## 2018-08-30 DIAGNOSIS — Z5321 Procedure and treatment not carried out due to patient leaving prior to being seen by health care provider: Secondary | ICD-10-CM | POA: Diagnosis not present

## 2018-08-30 DIAGNOSIS — I83891 Varicose veins of right lower extremities with other complications: Secondary | ICD-10-CM | POA: Diagnosis not present

## 2018-08-30 NOTE — ED Triage Notes (Signed)
Pt pulled off her hose tonight and her leg started bleeding possibly from a varicose vein. Bleeding is currently controlled. Pt is on zaralto

## 2018-08-31 ENCOUNTER — Emergency Department
Admission: EM | Admit: 2018-08-31 | Discharge: 2018-08-31 | Disposition: A | Discharge status: Home / Self Care | Payer: Medicare HMO | Attending: Emergency Medicine | Admitting: Emergency Medicine

## 2018-08-31 DIAGNOSIS — I83899 Varicose veins of unspecified lower extremities with other complications: Secondary | ICD-10-CM

## 2018-08-31 NOTE — Discharge Instructions (Signed)
If your vein begins to bleed again please use a single finger to place direct pressure for at least 15 minutes while elevating her leg.  Return to the emergency department for any concerns.  It was a pleasure to take care of you today, and thank you for coming to our emergency department.  If you have any questions or concerns before leaving please ask the nurse to grab me and I'm more than happy to go through your aftercare instructions again.  If you have any concerns once you are home that you are not improving or are in fact getting worse before you can make it to your follow-up appointment, please do not hesitate to call 911 and come back for further evaluation.  Darel Hong, MD

## 2018-08-31 NOTE — ED Provider Notes (Signed)
[facility] Emergency Department Provider Note  ____________________________________________   First MD Initiated Contact with Patient 08/31/18 [phone]     (approximate)  I have reviewed the triage vital signs and the nursing notes.   HISTORY  Chief Complaint Extremity Laceration   HPI [name] is a 77 y.o. female who comes to the emergency department after noting a small amount of bleeding from a varicose vein to her right lower extremity earlier today when taking off her hose.  She became concerned because she is taking Xarelto and it was difficult to get the bleeding to stop.  Her tetanus is up-to-date.  Symptoms began suddenly were constant stopped with pressure.   Past Medical History:  Diagnosis Date  . Cat bite of right lower leg [id number]  . Cellulitis of lower leg 12/07/2014  . Hypertension     Patient Active Problem List   Diagnosis Date Noted  . Chronic venous insufficiency 10/14/2016  . Atrial fibrillation ([place]) 06/15/2015  . Rash 06/15/2015  . HTN (hypertension) 04/11/2015  . Osteoarthritis 04/11/2015    History reviewed. No pertinent surgical history.  Prior to Admission medications   Medication Sig Start Date End Date Taking? Authorizing Provider  diclofenac sodium (VOLTAREN) 1 % GEL Apply 4 g topically 4 (four) times daily. Patient not taking: Reported on 08/22/2017 08/04/17   Park Liter P, DO  furosemide (LASIX) 20 MG tablet Take 20 mg by mouth.    [provider]  losartan (COZAAR) 100 MG tablet TAKE 1 TABLET(100 MG) BY MOUTH DAILY 08/27/18   Johnson, Megan P, DO  metoprolol succinate (TOPROL-XL) 50 MG 24 hr tablet TAKE 1 TABLET BY MOUTH DAILY. TAKE WITH OR IMMEDIATELY FOLLOWING A MEAL 08/27/18   Johnson, Megan P, DO  potassium chloride (KLOR-CON) 20 MEQ packet Take 20 mEq by mouth 2 (two) times daily.    [provider]  rivaroxaban (XARELTO) 20 MG TABS tablet TAKE 1 TABLET BY MOUTH EVERY DAY WITH DINNER  08/27/18   Park Liter P, DO    Allergies Amlodipine and Hctz [hydrochlorothiazide]  Family History  Problem Relation Age of Onset  . Hypertension Mother   . Alcohol abuse Father   . Heart disease Father   . Hypertension Father   . Hyperlipidemia Sister   . Hypertension Sister   . Alcohol abuse Brother   . Hypertension Brother     Social History Social History   Tobacco Use  . Smoking status: Former Smoker    Last attempt to quit: 11/04/1978    Years since quitting: 39.8  . Smokeless tobacco: Never Used  Substance Use Topics  . Alcohol use: No  . Drug use: No    Review of Systems Constitutional: No fever/chills Cardiovascular: Denies chest pain. Respiratory: Denies shortness of breath. Gastrointestinal: No abdominal pain.  No nausea, no vomiting.  Skin: Positive for bleeding leg wound Neurological: Negative for headaches   ____________________________________________   PHYSICAL EXAM:  VITAL SIGNS: ED Triage Vitals  Enc Vitals Group     BP 08/30/18 2006 130/82     Pulse Rate 08/30/18 2006 66     Resp 08/30/18 2006 16     Temp 08/30/18 2006 98.9 F (37.2 C)     Temp Source 08/30/18 2006 Oral     SpO2 08/30/18 2006 98 %     Weight 08/30/18 2007 190 lb (86.2 kg)     Height 08/30/18 2007 5\' 7"  (1.702 m)     Head Circumference --  Peak Flow --      Pain Score 08/30/18 2007 0     Pain Loc --      Pain Edu? --      Excl. in [place]? --     Constitutional: Alert and oriented x4 well-appearing nontoxic no diaphoresis speaks full clear sentences Cardiovascular: Regular rate and rhythm Respiratory: Normal respiratory effort.  No retractions. Neurologic:  Normal speech and language. No gross focal neurologic deficits are appreciated.  Skin: No active bleeding    ____________________________________________  LABS (all labs ordered are listed, but only abnormal results are displayed)  Labs Reviewed - No data to  display   __________________________________________  EKG   ____________________________________________  RADIOLOGY   ____________________________________________   DIFFERENTIAL includes but not limited to  Laceration, hemorrhage, cellulitis   PROCEDURES  Procedure(s) performed: no  Procedures  Critical Care performed: no  ____________________________________________   INITIAL IMPRESSION / ASSESSMENT AND PLAN / ED COURSE  Pertinent labs & imaging results that were available during my care of the patient were reviewed by me and considered in my medical decision making (see chart for details).   As part of my medical decision making, I reviewed the following data within the [place] History obtained from family if available, nursing notes, old chart and ekg, as well as notes from prior ED visits.  Patient is no longer bleeding.  Her clot has stabilized.  No further work-up or treatment indicated at this time.  I have advised the patient to use one finger for direct pressure instead of gauze in the future should this recur.      ____________________________________________   FINAL CLINICAL IMPRESSION(S) / ED DIAGNOSES  Final diagnoses:  Bleeding from varicose vein      NEW MEDICATIONS STARTED DURING THIS VISIT:  Discharge Medication List as of 08/31/2018  1:23 AM       Note:  This document was prepared using Dragon voice recognition software and may include unintentional dictation errors.      Darel Hong, MD 09/04/18 [phone]

## 2018-10-18 NOTE — Progress Notes (Signed)
Cardiology Office Note  Date:  10/19/2018   ID:  [name], DOB 06-Feb-1941, MRN [id number]  PCP:  Lisa Roys, DO   Chief Complaint  Patient presents with  . other    OD 12 month f/u ls 11/2016 c/o edema legs. Meds reviewed verbally with pt.    HPI:  [name] is a 77 year old woman,  with history of labile blood pressure  chronic atrial fibrillation.  who presents for follow-up of her hypertension and chronic atrial fibrillation.   In follow-up today she reports continued leg swelling but it is stable, no ulcers, no infections as she has had in the past Previously was seen by the vein center, wound center Legs have healed Continues to have varicose veins  Some medication confusion, Lasix and potassium is on her list We did call the pharmacy on today's visit and they say that she has not filled these  Chronic knee pain, seems to come and go with swelling  Reports she is active but no regular exercise program Walks the driveway to get mail Denies any leg weakness  Troubled by her weight gain, changed her diet low carbohydrate in the evening On prior office visit was eating candy  EKG personally reviewed by myself on todays visit Shows atrial fibrillation ventricular rate 57 bpm right bundle branch block  Prior EKG reviewed showing heart rate 52  Other past medical history reviewed Echocardiogram done recently showing severely dilated left atrium, elevated right ventricular systolic pressure, normal ejection fraction.  Remote history of smoking, not for 30 years Husband died several years ago No regular exercise program Reports her blood pressure is labile, sometimes low at home  previous swelling on amlodpine  PMH:   has a past medical history of Cat bite of right lower leg ([id number]), Cellulitis of lower leg (12/07/2014), and Hypertension.  PSH:   History reviewed. No pertinent surgical history.  Current Outpatient Medications  Medication Sig Dispense  Refill  . diclofenac sodium (VOLTAREN) 1 % GEL Apply 4 g topically 4 (four) times daily. 100 g 12  . furosemide (LASIX) 20 MG tablet Take 20 mg by mouth.    . losartan (COZAAR) 100 MG tablet TAKE 1 TABLET(100 MG) BY MOUTH DAILY 90 tablet 3  . metoprolol succinate (TOPROL-XL) 50 MG 24 hr tablet TAKE 1 TABLET BY MOUTH DAILY. TAKE WITH OR IMMEDIATELY FOLLOWING A MEAL 90 tablet 3  . potassium chloride (KLOR-CON) 20 MEQ packet Take 20 mEq by mouth daily.     . rivaroxaban (XARELTO) 20 MG TABS tablet TAKE 1 TABLET BY MOUTH EVERY DAY WITH DINNER 90 tablet 3   No current facility-administered medications for this visit.      Allergies:   Amlodipine and Hctz [hydrochlorothiazide]   Social History:  The patient  reports that she quit smoking about 39 years ago. She has never used smokeless tobacco. She reports that she does not drink alcohol or use drugs.   Family History:   family history includes Alcohol abuse in her brother and father; Heart disease in her father; Hyperlipidemia in her sister; Hypertension in her brother, father, mother, and sister.    Review of Systems: Review of Systems  Constitutional: Negative.   Respiratory: Negative.   Cardiovascular: Positive for leg swelling.  Gastrointestinal: Negative.   Musculoskeletal: Positive for joint pain.  Neurological: Negative.   Psychiatric/Behavioral: Negative.   All other systems reviewed and are negative.    PHYSICAL EXAM: VS:  BP 130/64 (BP Location: Left Arm, Patient  Position: Sitting, Cuff Size: Normal)   Pulse (!) 57   Ht 5\' 7"  (1.702 m)   Wt 195 lb 12 oz (88.8 kg)   BMI 30.66 kg/m  , BMI Body mass index is 30.66 kg/m. Constitutional:  oriented to person, place, and time. No distress.  HENT:  Head: Grossly normal Eyes:  no discharge. No scleral icterus.  Neck: No JVD, no carotid bruits  Cardiovascular: Irregularly irregular,  no murmurs appreciated Pulmonary/Chest: Clear to auscultation bilaterally, no wheezes or  rails Abdominal: Soft.  no distension.  no tenderness.  Musculoskeletal: Normal range of motion Neurological:  normal muscle tone. Coordination normal. No atrophy Skin: Skin warm and dry Psychiatric: normal affect, pleasant  Recent Labs: 08/27/2018: ALT 12; BUN 19; Creatinine, Ser 0.62; Hemoglobin 12.6; Platelets 315; Potassium 4.4; Sodium 142; TSH 1.140    Lipid Panel Lab Results  Component Value Date   CHOL 136 08/27/2018   HDL 60 08/27/2018   LDLCALC 67 08/27/2018   TRIG 47 08/27/2018      Wt Readings from Last 3 Encounters:  10/19/18 195 lb 12 oz (88.8 kg)  08/30/18 190 lb (86.2 kg)  08/27/18 190 lb (86.2 kg)     ASSESSMENT AND PLAN:  Chronic atrial fibrillation ([place]) - Plan: EKG 12-Lead Rate running slow We will decrease metoprolol down to 25 mg daily  Essential hypertension Metoprolol down to 25 daily Some medication confusion, Lasix potassium still on her list but she does not know if she is taking this We did call her pharmacy and they have not filled this in long time We will call her and confirm and remove these medications from her list  Chronic venous insufficiency Recommend compression hose She is only wearing pantyhose Symptoms are stable  Varicose veins of lower extremity with ulcer, unspecified laterality ([place]) Ulcerations healed Recommended leg elevation and compressions  Weight gain Weight running high but she is monitoring her diet   Total encounter time more than 25 minutes  Greater than 50% was spent in counseling and coordination of care with the patient  Disposition:   F/U  12 months   Orders Placed This Encounter  Procedures  . EKG 12-Lead     Signed, Esmond Plants, M.D., Ph.D. 10/19/2018  [place], [place]

## 2018-10-19 ENCOUNTER — Encounter: Payer: Self-pay | Admitting: Cardiovascular Disease

## 2018-10-19 ENCOUNTER — Ambulatory Visit: Payer: Medicare HMO | Admitting: Cardiovascular Disease

## 2018-10-19 VITALS — BP 130/64 | HR 57 | Ht 67.0 in | Wt 195.8 lb

## 2018-10-19 DIAGNOSIS — I872 Venous insufficiency (chronic) (peripheral): Secondary | ICD-10-CM | POA: Diagnosis not present

## 2018-10-19 DIAGNOSIS — I10 Essential (primary) hypertension: Secondary | ICD-10-CM

## 2018-10-19 DIAGNOSIS — I480 Paroxysmal atrial fibrillation: Secondary | ICD-10-CM

## 2018-10-19 MED ORDER — METOPROLOL SUCCINATE ER 25 MG PO TB24: ORAL_TABLET | ORAL | 3 refills | Status: DC

## 2018-10-19 NOTE — Patient Instructions (Addendum)
Medication Instructions:   Please decrease the metoprolol down to 25 mg daily  (previously you were on 50 mg daily)  If you need a refill on your cardiac medications before your next appointment, please call your pharmacy.    Lab work: No new labs needed   If you have labs (blood work) drawn today and your tests are completely normal, you will receive your results only by: Marland Kitchen MyChart Message (if you have MyChart) OR . A paper copy in the mail If you have any lab test that is abnormal or we need to change your treatment, we will call you to review the results.   Testing/Procedures: No new testing needed   Follow-Up: At [facility], you and your health needs are our priority.  As part of our continuing mission to provide you with exceptional heart care, we have created designated Provider Care Teams.  These Care Teams include your primary Cardiologist (physician) and Advanced Practice Providers (APPs -  Physician Assistants and Nurse Practitioners) who all work together to provide you with the care you need, when you need it.  . You will need a follow up appointment in 12 months .   Please call our office 2 months in advance to schedule this appointment.    . Providers on your designated Care Team:   . Murray Hodgkins, NP . Christell Faith, PA-C . Marrianne Mood, PA-C  Any Other Special Instructions Will Be Listed Below (If Applicable).  For educational health videos Log in to : [email or web address] Or : [email or web address], password : [email or web address]

## 2019-03-02 ENCOUNTER — Ambulatory Visit (INDEPENDENT_AMBULATORY_CARE_PROVIDER_SITE_OTHER): Payer: Medicare HMO | Admitting: Family Medicine

## 2019-03-02 ENCOUNTER — Other Ambulatory Visit: Payer: Self-pay

## 2019-03-02 ENCOUNTER — Encounter: Payer: Self-pay | Admitting: Family Medicine

## 2019-03-02 VITALS — BP 140/71

## 2019-03-02 DIAGNOSIS — I10 Essential (primary) hypertension: Secondary | ICD-10-CM

## 2019-03-02 DIAGNOSIS — I480 Paroxysmal atrial fibrillation: Secondary | ICD-10-CM | POA: Diagnosis not present

## 2019-03-02 NOTE — Assessment & Plan Note (Signed)
Under good control on current regimen. Continue current regimen. Continue to monitor. Call with any concerns. Refills given. Will get labs drawn.

## 2019-03-02 NOTE — Assessment & Plan Note (Signed)
Stable. Continue to follow with cardiology. Continue her xarelto. Call with any concerns.

## 2019-03-02 NOTE — Progress Notes (Signed)
BP 140/71    Subjective:    Patient ID: [name], female    DOB: 1941-09-28, 78 y.o.   MRN: [id number]  HPI: [name] is a 78 y.o. female  Chief Complaint  Patient presents with  . Hypertension   HYPERTENSION Hypertension status: controlled  Satisfied with current treatment? yes Duration of hypertension: chronic BP monitoring frequency:  not checking BP medication side effects:  no Medication compliance: excellent compliance Previous BP meds: metoprolol, losartan Aspirin: no Recurrent headaches: no Visual changes: no Palpitations: no Dyspnea: no Chest pain: no Lower extremity edema: no Dizzy/lightheaded: no  Relevant past medical, surgical, family and social history reviewed and updated as indicated. Interim medical history since our last visit reviewed. Allergies and medications reviewed and updated.  Review of Systems  Constitutional: Negative.   Respiratory: Negative.   Cardiovascular: Negative.   Neurological: Negative.   Psychiatric/Behavioral: Negative.     Per HPI unless specifically indicated above     Objective:    BP 140/71   Wt Readings from Last 3 Encounters:  10/19/18 195 lb 12 oz (88.8 kg)  08/30/18 190 lb (86.2 kg)  08/27/18 190 lb (86.2 kg)    Physical Exam Vitals signs and nursing note reviewed.  Pulmonary:     Effort: Pulmonary effort is normal. No respiratory distress.     Comments: Speaking in full sentences Neurological:     Mental Status: She is alert.  Psychiatric:        Mood and Affect: Mood normal.        Behavior: Behavior normal.        Thought Content: Thought content normal.        Judgment: Judgment normal.     Results for orders placed or performed in visit on 08/27/18  Microscopic Examination  Result Value Ref Range   WBC, UA 0-5 0 - 5 /hpf   RBC, UA 0-2 0 - 2 /hpf   Epithelial Cells (non renal) 0-10 0 - 10 /hpf   Renal Epithel, UA 0-10 (A) None seen /hpf   Bacteria, UA Few None seen/Few  CBC with  Differential/Platelet  Result Value Ref Range   WBC 6.0 3.4 - 10.8 x10E3/uL   RBC 4.05 3.77 - 5.28 x10E6/uL   Hemoglobin 12.6 11.1 - 15.9 g/dL   Hematocrit 38.1 34.0 - 46.6 %   MCV 94 79 - 97 fL   MCH 31.1 26.6 - 33.0 pg   MCHC 33.1 31.5 - 35.7 g/dL   RDW 12.8 12.3 - 15.4 %   Platelets 315 150 - 450 x10E3/uL   Neutrophils 65 Not Estab. %   Lymphs 23 Not Estab. %   Monocytes 8 Not Estab. %   Eos 3 Not Estab. %   Basos 1 Not Estab. %   Neutrophils Absolute 3.9 1.4 - 7.0 x10E3/uL   Lymphocytes Absolute 1.4 0.7 - 3.1 x10E3/uL   Monocytes Absolute 0.5 0.1 - 0.9 x10E3/uL   EOS (ABSOLUTE) 0.2 0.0 - 0.4 x10E3/uL   Basophils Absolute 0.0 0.0 - 0.2 x10E3/uL   Immature Granulocytes 0 Not Estab. %   Immature Grans (Abs) 0.0 0.0 - 0.1 x10E3/uL  Comprehensive metabolic panel  Result Value Ref Range   Glucose 79 65 - 99 mg/dL   BUN 19 8 - 27 mg/dL   Creatinine, Ser 0.62 0.57 - 1.00 mg/dL   GFR calc non Af Amer 87 >59 mL/min/1.73   GFR calc Af Amer 101 >59 mL/min/1.73   BUN/Creatinine Ratio 31 (  H) 12 - 28   Sodium 142 134 - 144 mmol/L   Potassium 4.4 3.5 - 5.2 mmol/L   Chloride 105 96 - 106 mmol/L   CO2 23 20 - 29 mmol/L   Calcium 9.2 8.7 - 10.3 mg/dL   Total Protein 6.3 6.0 - 8.5 g/dL   Albumin 4.0 3.5 - 4.8 g/dL   Globulin, Total 2.3 1.5 - 4.5 g/dL   Albumin/Globulin Ratio 1.7 1.2 - 2.2   Bilirubin Total 0.3 0.0 - 1.2 mg/dL   Alkaline Phosphatase 87 39 - 117 IU/L   AST 16 0 - 40 IU/L   ALT 12 0 - 32 IU/L  Lipid Panel w/o Chol/HDL Ratio  Result Value Ref Range   Cholesterol, Total 136 100 - 199 mg/dL   Triglycerides 47 0 - 149 mg/dL   HDL 60 >39 mg/dL   VLDL Cholesterol Cal 9 5 - 40 mg/dL   LDL Calculated 67 0 - 99 mg/dL  Microalbumin, Urine Waived  Result Value Ref Range   Microalb, Ur Waived 10 0 - 19 mg/L   Creatinine, Urine Waived 50 10 - 300 mg/dL   Microalb/Creat Ratio <30 <30 mg/g  TSH  Result Value Ref Range   TSH 1.140 0.450 - 4.500 uIU/mL  UA/M w/rflx Culture,  Routine  Result Value Ref Range   Specific Gravity, UA 1.015 1.005 - 1.030   pH, UA 5.5 5.0 - 7.5   Color, UA Yellow Yellow   Appearance Ur Hazy (A) Clear   Leukocytes, UA 1+ (A) Negative   Protein, UA Negative Negative/Trace   Glucose, UA Negative Negative   Ketones, UA Negative Negative   RBC, UA 1+ (A) Negative   Bilirubin, UA Negative Negative   Urobilinogen, Ur 1.0 0.2 - 1.0 mg/dL   Nitrite, UA Negative Negative   Microscopic Examination See below:       Assessment & Plan:   Problem List Items Addressed This Visit      Cardiovascular and Mediastinum   HTN (hypertension) - Primary    Under good control on current regimen. Continue current regimen. Continue to monitor. Call with any concerns. Refills given. Will get labs drawn.        Relevant Orders   Basic metabolic panel   Atrial fibrillation (HCC)    Stable. Continue to follow with cardiology. Continue her xarelto. Call with any concerns.           Follow up plan: Return in about 6 months (around 09/01/2019) for wellness/physical.   . This visit was completed via telephone due to the restrictions of the COVID-19 pandemic. All issues as above were discussed and addressed but no physical exam was performed. If it was felt that the patient should be evaluated in the office, they were directed there. The patient verbally consented to this visit. Patient was unable to complete an audio/visual visit due to Lack of equipment. Due to the catastrophic nature of the COVID-19 pandemic, this visit was done through audio contact only. . Location of the patient: home . Location of the provider: work . Those involved with this call:  . Provider: Park Liter, DO . CMA: Tiffany Reel, CMA . Front Desk/Registration: Don Perking  . Time spent on call: 20 minutes on the phone discussing health concerns. 25 minutes total spent in review of patient's record and preparation of their chart.

## 2019-03-09 ENCOUNTER — Other Ambulatory Visit: Payer: Self-pay

## 2019-03-09 ENCOUNTER — Other Ambulatory Visit: Payer: Medicare HMO

## 2019-03-09 DIAGNOSIS — I10 Essential (primary) hypertension: Secondary | ICD-10-CM | POA: Diagnosis not present

## 2019-03-10 LAB — BASIC METABOLIC PANEL
BUN/Creatinine Ratio: 32 — ABNORMAL HIGH (ref 12–28)
BUN: 30 mg/dL — ABNORMAL HIGH (ref 8–27)
CO2: 23 mmol/L (ref 20–29)
Calcium: 9.1 mg/dL (ref 8.7–10.3)
Chloride: 104 mmol/L (ref 96–106)
Creatinine, Ser: 0.95 mg/dL (ref 0.57–1.00)
GFR calc Af Amer: 67 mL/min/{1.73_m2} (ref >59)
GFR calc non Af Amer: 58 mL/min/{1.73_m2} — ABNORMAL LOW (ref >59)
Glucose: 82 mg/dL (ref 65–99)
Potassium: 4.6 mmol/L (ref 3.5–5.2)
Sodium: 142 mmol/L (ref 134–144)

## 2019-03-11 ENCOUNTER — Encounter: Payer: Self-pay | Admitting: Family Medicine

## 2019-08-02 ENCOUNTER — Other Ambulatory Visit: Payer: Self-pay | Admitting: Family Medicine

## 2019-08-02 NOTE — Telephone Encounter (Signed)
Forwarding medication refill request to PCP for review. 

## 2019-08-30 ENCOUNTER — Ambulatory Visit (INDEPENDENT_AMBULATORY_CARE_PROVIDER_SITE_OTHER): Payer: Medicare HMO | Admitting: Family Medicine

## 2019-08-30 ENCOUNTER — Encounter: Payer: Self-pay | Admitting: Family Medicine

## 2019-08-30 ENCOUNTER — Other Ambulatory Visit: Payer: Self-pay

## 2019-08-30 ENCOUNTER — Ambulatory Visit (INDEPENDENT_AMBULATORY_CARE_PROVIDER_SITE_OTHER): Payer: Medicare HMO

## 2019-08-30 VITALS — BP 172/82 | HR 66 | Temp 98.6°F | Ht 66.0 in | Wt 207.8 lb

## 2019-08-30 VITALS — BP 171/83 | HR 58 | Temp 98.6°F | Resp 16 | Ht 66.0 in | Wt 207.8 lb

## 2019-08-30 DIAGNOSIS — I480 Paroxysmal atrial fibrillation: Secondary | ICD-10-CM | POA: Diagnosis not present

## 2019-08-30 DIAGNOSIS — I10 Essential (primary) hypertension: Secondary | ICD-10-CM | POA: Diagnosis not present

## 2019-08-30 DIAGNOSIS — Z0000 Encounter for general adult medical examination without abnormal findings: Secondary | ICD-10-CM | POA: Diagnosis not present

## 2019-08-30 DIAGNOSIS — Z13220 Encounter for screening for lipoid disorders: Secondary | ICD-10-CM

## 2019-08-30 DIAGNOSIS — Z23 Encounter for immunization: Secondary | ICD-10-CM | POA: Diagnosis not present

## 2019-08-30 LAB — UA/M W/RFLX CULTURE, ROUTINE
Bilirubin, UA: NEGATIVE
Glucose, UA: NEGATIVE
Ketones, UA: NEGATIVE
Leukocytes,UA: NEGATIVE
Nitrite, UA: NEGATIVE
Protein,UA: NEGATIVE
Specific Gravity, UA: 1.015 (ref 1.005–1.030)
Urobilinogen, Ur: 0.2 mg/dL (ref 0.2–1.0)
pH, UA: 7 (ref 5.0–7.5)

## 2019-08-30 LAB — MICROSCOPIC EXAMINATION
Bacteria, UA: NONE SEEN
WBC, UA: NONE SEEN /hpf (ref 0–5)

## 2019-08-30 LAB — MICROALBUMIN, URINE WAIVED
Creatinine, Urine Waived: 10 mg/dL (ref 10–300)
Microalb, Ur Waived: 10 mg/L (ref 0–19)
Microalb/Creat Ratio: <30 mg/g (ref <30)

## 2019-08-30 MED ORDER — LOSARTAN POTASSIUM 100 MG PO TABS: ORAL_TABLET | ORAL | 1 refills | Status: DC

## 2019-08-30 MED ORDER — METOPROLOL SUCCINATE ER 25 MG PO TB24: ORAL_TABLET | ORAL | 1 refills | Status: DC

## 2019-08-30 MED ORDER — RIVAROXABAN 20 MG PO TABS: ORAL_TABLET | ORAL | 3 refills | Status: DC

## 2019-08-30 NOTE — Patient Instructions (Signed)
Health Maintenance After Age 78 After age 78, you are at a higher risk for certain long-term diseases and infections as well as injuries from falls. Falls are a major cause of broken bones and head injuries in people who are older than age 78. Getting regular preventive care can help to keep you healthy and well. Preventive care includes getting regular testing and making lifestyle changes as recommended by your health care provider. Talk with your health care provider about:  Which screenings and tests you should have. A screening is a test that checks for a disease when you have no symptoms.  A diet and exercise plan that is right for you. What should I know about screenings and tests to prevent falls? Screening and testing are the best ways to find a health problem early. Early diagnosis and treatment give you the best chance of managing medical conditions that are common after age 78. Certain conditions and lifestyle choices may make you more likely to have a fall. Your health care provider may recommend:  Regular vision checks. Poor vision and conditions such as cataracts can make you more likely to have a fall. If you wear glasses, make sure to get your prescription updated if your vision changes.  Medicine review. Work with your health care provider to regularly review all of the medicines you are taking, including over-the-counter medicines. Ask your health care provider about any side effects that may make you more likely to have a fall. Tell your health care provider if any medicines that you take make you feel dizzy or sleepy.  Osteoporosis screening. Osteoporosis is a condition that causes the bones to get weaker. This can make the bones weak and cause them to break more easily.  Blood pressure screening. Blood pressure changes and medicines to control blood pressure can make you feel dizzy.  Strength and balance checks. Your health care provider may recommend certain tests to check your  strength and balance while standing, walking, or changing positions.  Foot health exam. Foot pain and numbness, as well as not wearing proper footwear, can make you more likely to have a fall.  Depression screening. You may be more likely to have a fall if you have a fear of falling, feel emotionally low, or feel unable to do activities that you used to do.  Alcohol use screening. Using too much alcohol can affect your balance and may make you more likely to have a fall. What actions can I take to lower my risk of falls? General instructions  Talk with your health care provider about your risks for falling. Tell your health care provider if: ? You fall. Be sure to tell your health care provider about all falls, even ones that seem [place]. ? You feel dizzy, sleepy, or off-balance.  Take over-the-counter and prescription medicines only as told by your health care provider. These include any supplements.  Eat a healthy diet and maintain a healthy weight. A healthy diet includes low-fat dairy products, low-fat (lean) meats, and fiber from whole grains, beans, and lots of fruits and vegetables. Home safety  Remove any tripping hazards, such as rugs, cords, and clutter.  Install safety equipment such as grab bars in bathrooms and safety rails on stairs.  Keep rooms and walkways well-lit. Activity   Follow a regular exercise program to stay fit. This will help you maintain your balance. Ask your health care provider what types of exercise are appropriate for you.  If you need a cane or   walker, use it as recommended by your health care provider.  Wear supportive shoes that have nonskid soles. Lifestyle  Do not drink alcohol if your health care provider tells you not to drink.  If you drink alcohol, limit how much you have: ? 0-1 drink a day for women. ? 0-2 drinks a day for men.  Be aware of how much alcohol is in your drink. In the [place] one drink equals one typical bottle of beer (12  oz), one-half glass of wine (5 oz), or one shot of hard liquor (1 oz).  Do not use any products that contain nicotine or tobacco, such as cigarettes and e-cigarettes. If you need help quitting, ask your health care provider. Summary  Having a healthy lifestyle and getting preventive care can help to protect your health and wellness after age 78.  Screening and testing are the best way to find a health problem early and help you avoid having a fall. Early diagnosis and treatment give you the best chance for managing medical conditions that are more common for people who are older than age 78.  Falls are a major cause of broken bones and head injuries in people who are older than age 78. Take precautions to prevent a fall at home.  Work with your health care provider to learn what changes you can make to improve your health and wellness and to prevent falls. This information is not intended to replace advice given to you by your health care provider. Make sure you discuss any questions you have with your health care provider. Document Released: 09/03/2017 Document Revised: 02/11/2019 Document Reviewed: 09/03/2017 Elsevier Patient Education  2020 [employer]  

## 2019-08-30 NOTE — Assessment & Plan Note (Signed)
Running high today- but runs good at home. Continue to monitor it at home if sneaking up above 140s-150s, let us know. Refills given today.

## 2019-08-30 NOTE — Assessment & Plan Note (Signed)
Under good control on current regimen. Continue current regimen. Continue to monitor. Call with any concerns. Refills given. Continue to follow with cardiology.  

## 2019-08-30 NOTE — Patient Instructions (Addendum)
[name] , Thank you for taking time to come for your Medicare Wellness Visit. I appreciate your ongoing commitment to your health goals. Please review the following plan we discussed and let me know if I can assist you in the future.   Screening recommendations/referrals: Colonoscopy: no longer required Mammogram: no longer required Bone Density: declined  Recommended yearly ophthalmology/optometry visit for glaucoma screening and checkup Recommended yearly dental visit for hygiene and checkup  Vaccinations: Influenza vaccine: done today Pneumococcal vaccine: up to date  Tdap vaccine: up to date  Shingles vaccine: shingrix eligible     Advanced directives: Advance directive discussed with you today. I have provided a copy for you to complete at home and have notarized. Once this is complete please bring a copy in to our office so we can scan it into your chart.  Conditions/risks identified: Hypertension, please keep an eye on your blood pressure at home.   Next appointment: Follow up in one year for your annual wellness visit    Preventive Care 65 Years and Older, Female Preventive care refers to lifestyle choices and visits with your health care provider that can promote health and wellness. What does preventive care include?  A yearly physical exam. This is also called an annual well check.  Dental exams once or twice a year.  Routine eye exams. Ask your health care provider how often you should have your eyes checked.  Personal lifestyle choices, including:  Daily care of your teeth and gums.  Regular physical activity.  Eating a healthy diet.  Avoiding tobacco and drug use.  Limiting alcohol use.  Practicing safe sex.  Taking low-dose aspirin every day.  Taking vitamin and mineral supplements as recommended by your health care provider. What happens during an annual well check? The services and screenings done by your health care provider during your annual  well check will depend on your age, overall health, lifestyle risk factors, and family history of disease. Counseling  Your health care provider may ask you questions about your:  Alcohol use.  Tobacco use.  Drug use.  Emotional well-being.  Home and relationship well-being.  Sexual activity.  Eating habits.  History of falls.  Memory and ability to understand (cognition).  Work and work Statistician.  Reproductive health. Screening  You may have the following tests or measurements:  Height, weight, and BMI.  Blood pressure.  Lipid and cholesterol levels. These may be checked every 5 years, or more frequently if you are over 54 years old.  Skin check.  Lung cancer screening. You may have this screening every year starting at age 58 if you have a 30-pack-year history of smoking and currently smoke or have quit within the past 15 years.  Fecal occult blood test (FOBT) of the stool. You may have this test every year starting at age 59.  Flexible sigmoidoscopy or colonoscopy. You may have a sigmoidoscopy every 5 years or a colonoscopy every 10 years starting at age 42.  Hepatitis C blood test.  Hepatitis B blood test.  Sexually transmitted disease (STD) testing.  Diabetes screening. This is done by checking your blood sugar (glucose) after you have not eaten for a while (fasting). You may have this done every 1-3 years.  Bone density scan. This is done to screen for osteoporosis. You may have this done starting at age 5.  Mammogram. This may be done every 1-2 years. Talk to your health care provider about how often you should have regular mammograms. Talk  with your health care provider about your test results, treatment options, and if necessary, the need for more tests. Vaccines  Your health care provider may recommend certain vaccines, such as:  Influenza vaccine. This is recommended every year.  Tetanus, diphtheria, and acellular pertussis (Tdap, Td) vaccine.  You may need a Td booster every 10 years.  Zoster vaccine. You may need this after age 13.  Pneumococcal 13-valent conjugate (PCV13) vaccine. One dose is recommended after age 56.  Pneumococcal polysaccharide (PPSV23) vaccine. One dose is recommended after age 34. Talk to your health care provider about which screenings and vaccines you need and how often you need them. This information is not intended to replace advice given to you by your health care provider. Make sure you discuss any questions you have with your health care provider. Document Released: 11/17/2015 Document Revised: 07/10/2016 Document Reviewed: 08/22/2015 Elsevier Interactive Patient Education  2017 [employer] Prevention in the Home Falls can cause injuries. They can happen to people of all ages. There are many things you can do to make your home safe and to help prevent falls. What can I do on the outside of my home?  Regularly fix the edges of walkways and driveways and fix any cracks.  Remove anything that might make you trip as you walk through a door, such as a raised step or threshold.  Trim any bushes or trees on the path to your home.  Use bright outdoor lighting.  Clear any walking paths of anything that might make someone trip, such as rocks or tools.  Regularly check to see if handrails are loose or broken. Make sure that both sides of any steps have handrails.  Any raised decks and porches should have guardrails on the edges.  Have any leaves, snow, or ice cleared regularly.  Use sand or salt on walking paths during winter.  Clean up any spills in your garage right away. This includes oil or grease spills. What can I do in the bathroom?  Use night lights.  Install grab bars by the toilet and in the tub and shower. Do not use towel bars as grab bars.  Use non-skid mats or decals in the tub or shower.  If you need to sit down in the shower, use a plastic, non-slip stool.  Keep the  floor dry. Clean up any water that spills on the floor as soon as it happens.  Remove soap buildup in the tub or shower regularly.  Attach bath mats securely with double-sided non-slip rug tape.  Do not have throw rugs and other things on the floor that can make you trip. What can I do in the bedroom?  Use night lights.  Make sure that you have a light by your bed that is easy to reach.  Do not use any sheets or blankets that are too big for your bed. They should not hang down onto the floor.  Have a firm chair that has side arms. You can use this for support while you get dressed.  Do not have throw rugs and other things on the floor that can make you trip. What can I do in the kitchen?  Clean up any spills right away.  Avoid walking on wet floors.  Keep items that you use a lot in easy-to-reach places.  If you need to reach something above you, use a strong step stool that has a grab bar.  Keep electrical cords out of the way.  Do  not use floor polish or wax that makes floors slippery. If you must use wax, use non-skid floor wax.  Do not have throw rugs and other things on the floor that can make you trip. What can I do with my stairs?  Do not leave any items on the stairs.  Make sure that there are handrails on both sides of the stairs and use them. Fix handrails that are broken or loose. Make sure that handrails are as long as the stairways.  Check any carpeting to make sure that it is firmly attached to the stairs. Fix any carpet that is loose or worn.  Avoid having throw rugs at the top or bottom of the stairs. If you do have throw rugs, attach them to the floor with carpet tape.  Make sure that you have a light switch at the top of the stairs and the bottom of the stairs. If you do not have them, ask someone to add them for you. What else can I do to help prevent falls?  Wear shoes that:  Do not have high heels.  Have rubber bottoms.  Are comfortable and fit  you well.  Are closed at the toe. Do not wear sandals.  If you use a stepladder:  Make sure that it is fully opened. Do not climb a closed stepladder.  Make sure that both sides of the stepladder are locked into place.  Ask someone to hold it for you, if possible.  Clearly mark and make sure that you can see:  Any grab bars or handrails.  First and last steps.  Where the edge of each step is.  Use tools that help you move around (mobility aids) if they are needed. These include:  Canes.  Walkers.  Scooters.  Crutches.  Turn on the lights when you go into a dark area. Replace any light bulbs as soon as they burn out.  Set up your furniture so you have a clear path. Avoid moving your furniture around.  If any of your floors are uneven, fix them.  If there are any pets around you, be aware of where they are.  Review your medicines with your doctor. Some medicines can make you feel dizzy. This can increase your chance of falling. Ask your doctor what other things that you can do to help prevent falls. This information is not intended to replace advice given to you by your health care provider. Make sure you discuss any questions you have with your health care provider. Document Released: 08/17/2009 Document Revised: 03/28/2016 Document Reviewed: 11/25/2014 Elsevier Interactive Patient Education  2017 [employer].

## 2019-08-30 NOTE — Progress Notes (Signed)
Subjective:   [name] is a 78 y.o. female who presents for Medicare Annual (Subsequent) preventive examination.  Review of Systems:  Cardiac Risk Factors include: advanced age (>37men, >75 women);hypertension;obesity (BMI >30kg/m2)     Objective:     Vitals: BP (!) 171/83 (BP Location: Left Arm, Patient Position: Sitting, Cuff Size: Normal)   Pulse (!) 58   Temp 98.6 F (37 C) (Temporal)   Resp 16   Ht 5\' 6"  (1.676 m)   Wt 207 lb 12.8 oz (94.3 kg)   SpO2 100%   BMI 33.54 kg/m   Body mass index is 33.54 kg/m.  Advanced Directives 08/30/2019 08/30/2018 08/27/2018 08/22/2017 01/16/2017 10/14/2016 08/06/2016  Does Patient Have a Medical Advance Directive? No No No No No No No  Would patient like information on creating a medical advance directive? Yes (MAU/Ambulatory/Procedural Areas - Information given) No - Patient declined Yes (MAU/Ambulatory/Procedural Areas - Information given) Yes (MAU/Ambulatory/Procedural Areas - Information given) - - Yes - Educational materials given    Tobacco Social History   Tobacco Use  Smoking Status Former Smoker  . Quit date: 11/04/1978  . Years since quitting: 40.8  Smokeless Tobacco Never Used     Counseling given: Not Answered   Clinical Intake:  Pre-visit preparation completed: Yes  Pain : No/denies pain     Nutritional Status: BMI > 30  Obese Nutritional Risks: None Diabetes: No  How often do you need to have someone help you when you read instructions, pamphlets, or other written materials from your doctor or pharmacy?: 1 - Never  Interpreter Needed?: No  Information entered by :: Tiffany Hill,LPN  Past Medical History:  Diagnosis Date  . Cat bite of right lower leg [id number]  . Cellulitis of lower leg 12/07/2014  . Hypertension    History reviewed. No pertinent surgical history. Family History  Problem Relation Age of Onset  . Hypertension Mother   . Alcohol abuse Father   . Heart disease Father   .  Hypertension Father   . Hyperlipidemia Sister   . Hypertension Sister   . Alcohol abuse Brother   . Hypertension Brother    Social History   Socioeconomic History  . Marital status: Widowed    Spouse name: Not on file  . Number of children: Not on file  . Years of education: Not on file  . Highest education level: High school graduate  Occupational History  . Occupation: retired   Scientific laboratory technician  . Financial resource strain: Not hard at all  . Food insecurity    Worry: Never true    Inability: Never true  . Transportation needs    Medical: No    Non-medical: No  Tobacco Use  . Smoking status: Former Smoker    Quit date: 11/04/1978    Years since quitting: 40.8  . Smokeless tobacco: Never Used  Substance and Sexual Activity  . Alcohol use: No  . Drug use: No  . Sexual activity: Never  Lifestyle  . Physical activity    Days per week: 0 days    Minutes per session: 0 min  . Stress: Not at all  Relationships  . Social Herbalist on phone: Once a week    Gets together: Once a week    Attends religious service: More than 4 times per year    Active member of club or organization: No    Attends meetings of clubs or organizations: Never    Relationship status:  Widowed  Other Topics Concern  . Not on file  Social History Narrative  . Not on file    Outpatient Encounter Medications as of 08/30/2019  Medication Sig  . losartan (COZAAR) 100 MG tablet TAKE 1 TABLET(100 MG) BY MOUTH DAILY  . metoprolol succinate (TOPROL-XL) 25 MG 24 hr tablet TAKE 1 TABLET BY MOUTH DAILY. TAKE WITH OR IMMEDIATELY FOLLOWING A MEAL  . XARELTO 20 MG TABS tablet TAKE 1 TABLET BY MOUTH EVERY DAY WITH DINNER   No facility-administered encounter medications on file as of 08/30/2019.     Activities of Daily Living In your present state of health, do you have any difficulty performing the following activities: 08/30/2019  Hearing? N  Comment no hearing aids  Vision? N  Comment reading  glasses, goes to eye dr.  Difficulty concentrating or making decisions? N  Comment eventually comes back  Walking or climbing stairs? N  Dressing or bathing? N  Doing errands, shopping? N  Preparing Food and eating ? N  Using the Toilet? N  In the past six months, have you accidently leaked urine? N  Do you have problems with loss of bowel control? N  Managing your Medications? N  Managing your Finances? N  Housekeeping or managing your Housekeeping? N  Some recent data might be hidden    Patient Care Team: [name], DO as PCP - General (Family Medicine) Minna Merritts, MD as Consulting Physician (Cardiology)    Assessment:   This is a routine wellness examination for [facility].  Exercise Activities and Dietary recommendations Current Exercise Habits: The patient does not participate in regular exercise at present, Exercise limited by: None identified  Goals    . DIET - INCREASE WATER INTAKE     Recommend drinking at least 6-8 glasses of water a day     . Exercise 150 minutes per week (moderate activity)     Mild exercising/stretching discussed       Fall Risk: Fall Risk  08/30/2019 08/27/2018 08/22/2017 08/06/2016 04/11/2015  Falls in the past year? 0 No No No No  Number falls in past yr: 0 - - - -  Injury with Fall? 0 - - - -    FALL RISK PREVENTION PERTAINING TO THE HOME:  Any stairs in or around the home? No  If so, are there any without handrails? No   Home free of loose throw rugs in walkways, pet beds, electrical cords, etc? Yes  Adequate lighting in your home to reduce risk of falls? Yes   ASSISTIVE DEVICES UTILIZED TO PREVENT FALLS:  Life alert? No  Use of a cane, walker or w/c? Yes  walking stick as needed Grab bars in the bathroom? No  Shower chair or bench in shower? No  Elevated toilet seat or a handicapped toilet? No   DME ORDERS:  DME order needed?  No   TIMED UP AND GO:  Was the test performed? Yes .  Length of time to ambulate 10  feet: 10 sec.   GAIT:  Appearance of gait: Gait slow and steady without the use of an assistive device.  Education: Fall risk prevention has been discussed.  Intervention(s) required? No   DME/home health order needed?  No    Depression Screen PHQ 2/9 Scores 08/30/2019 08/27/2018 08/22/2017 08/06/2016  PHQ - 2 Score 0 0 0 0     Cognitive Function     6CIT Screen 08/30/2019 08/27/2018 08/22/2017  What Year? 0 points 0 points 0  points  What month? 0 points 0 points 0 points  What time? 0 points 0 points 0 points  Count back from 20 0 points 0 points 0 points  Months in reverse 0 points 0 points 0 points  Repeat phrase 10 points 2 points 0 points  Total Score 10 2 0    Immunization History  Administered Date(s) Administered  . Fluad Quad(high Dose 65+) 08/30/2019  . Influenza, High Dose Seasonal PF 08/06/2016, 08/22/2017, 08/27/2018  . Influenza-Unspecified 12/19/2015  . Pneumococcal Conjugate-13 08/06/2016  . Pneumococcal Polysaccharide-23 08/22/2017  . Td 12/07/2014    Qualifies for Shingles Vaccine? Yes  Zostavax completed n/a. Due for Shingrix. Education has been provided regarding the importance of this vaccine. Pt has been advised to call insurance company to determine out of pocket expense. Advised may also receive vaccine at local pharmacy or Health Dept. Verbalized acceptance and understanding.  Tdap: up to date   Flu Vaccine: Due for Flu vaccine. Does the patient want to receive this vaccine today? Yes   Pneumococcal Vaccine: up to date   Screening Tests Health Maintenance  Topic Date Due  . TETANUS/TDAP  12/07/2024  . INFLUENZA VACCINE  Completed  . PNA vac Low Risk Adult  Completed  . DEXA SCAN  Discontinued    Cancer Screenings:  Colorectal Screening: no longer required   Mammogram: no loner required  Bone Density: no longer required   Lung Cancer Screening: (Low Dose CT Chest recommended if Age 26-80 years, 30 pack-year currently smoking OR  have quit w/in 15years.) does not qualify.    Additional Screening:  Hepatitis C Screening: does not qualify  Vision Screening: Recommended annual ophthalmology exams for early detection of glaucoma and other disorders of the eye. Is the patient up to date with their annual eye exam?  Yes  Who is the provider or what is the name of the office in which the pt attends annual eye exams? Unsure of name   Dental Screening: Recommended annual dental exams for proper oral hygiene  Community Resource Referral:  CRR required this visit?  No       Plan:  I have personally reviewed and addressed the Medicare Annual Wellness questionnaire and have noted the following in the patient's chart:  A. Medical and social history B. Use of alcohol, tobacco or illicit drugs  C. Current medications and supplements D. Functional ability and status E.  Nutritional status F.  Physical activity G. Advance directives H. List of other physicians I.  Hospitalizations, surgeries, and ER visits in previous 12 months J.  Binger such as hearing and vision if needed, cognitive and depression L. Referrals and appointments   In addition, I have reviewed and discussed with patient certain preventive protocols, quality metrics, and best practice recommendations. A written personalized care plan for preventive services as well as general preventive health recommendations were provided to patient.  Signed,    Bevelyn Ngo, LPN  [id number] Nurse Health Advisor   Nurse Notes: none

## 2019-08-30 NOTE — Progress Notes (Signed)
BP (!) 172/82   Pulse 66   Temp 98.6 F (37 C)   Ht 5\' 6"  (1.676 m)   Wt 207 lb 12.8 oz (94.3 kg)   SpO2 100%   BMI 33.54 kg/m    Subjective:    Patient ID: [name], female    DOB: May 01, 1941, 78 y.o.   MRN: [id number]  HPI: [name] is a 78 y.o. female presenting on 08/30/2019 for comprehensive medical examination. Current medical complaints include:  HYPERTENSION Hypertension status: up here, and down at home  Satisfied with current treatment? yes Duration of hypertension: chronic BP monitoring frequency:  a few times a week BP range: 130s/70s BP medication side effects:  no Medication compliance: excellent compliance Previous BP meds: losartan, metoprolol  Aspirin: no Recurrent headaches: no Visual changes: no Palpitations: no Dyspnea: no Chest pain: no Lower extremity edema: no Dizzy/lightheaded: no  ATRIAL FIBRILLATION Atrial fibrillation status: controlled Satisfied with current treatment: yes  Medication side effects:  no Medication compliance: excellent compliance Palpitations:  no Chest pain:  no Dyspnea on exertion:  no Orthopnea:  no Syncope:  no Edema:  no Ventricular rate control: B-blocker Anti-coagulation: long acting  She currently lives with: alone Menopausal Symptoms: no  Depression Screen done today and results listed below:  Depression screen [facility] 2/9 08/30/2019 08/27/2018 08/22/2017 08/06/2016 04/11/2015  Decreased Interest 0 0 0 0 0  Down, Depressed, Hopeless 0 0 - 0 0  PHQ - 2 Score 0 0 0 0 0    Past Medical History:  Past Medical History:  Diagnosis Date  . Cat bite of right lower leg [id number]  . Cellulitis of lower leg 12/07/2014  . Hypertension     Surgical History:  History reviewed. No pertinent surgical history.  Medications:  No current outpatient medications on file prior to visit.   No current facility-administered medications on file prior to visit.     Allergies:  Allergies  Allergen Reactions  .  Amlodipine     Leg swelling  . Hctz [Hydrochlorothiazide] Nausea Only and Rash    Social History:  Social History   Socioeconomic History  . Marital status: Widowed    Spouse name: Not on file  . Number of children: Not on file  . Years of education: Not on file  . Highest education level: High school graduate  Occupational History  . Occupation: retired   Scientific laboratory technician  . Financial resource strain: Not hard at all  . Food insecurity    Worry: Never true    Inability: Never true  . Transportation needs    Medical: No    Non-medical: No  Tobacco Use  . Smoking status: Former Smoker    Quit date: 11/04/1978    Years since quitting: 40.8  . Smokeless tobacco: Never Used  Substance and Sexual Activity  . Alcohol use: No  . Drug use: No  . Sexual activity: Never  Lifestyle  . Physical activity    Days per week: 0 days    Minutes per session: 0 min  . Stress: Not at all  Relationships  . Social Herbalist on phone: Once a week    Gets together: Once a week    Attends religious service: More than 4 times per year    Active member of club or organization: No    Attends meetings of clubs or organizations: Never    Relationship status: Widowed  . Intimate partner violence    Fear  of current or ex partner: No    Emotionally abused: No    Physically abused: No    Forced sexual activity: No  Other Topics Concern  . Not on file  Social History Narrative  . Not on file   Social History   Tobacco Use  Smoking Status Former Smoker  . Quit date: 11/04/1978  . Years since quitting: 40.8  Smokeless Tobacco Never Used   Social History   Substance and Sexual Activity  Alcohol Use No    Family History:  Family History  Problem Relation Age of Onset  . Hypertension Mother   . Alcohol abuse Father   . Heart disease Father   . Hypertension Father   . Hyperlipidemia Sister   . Hypertension Sister   . Alcohol abuse Brother   . Hypertension Brother     Past  medical history, surgical history, medications, allergies, family history and social history reviewed with patient today and changes made to appropriate areas of the chart.   Review of Systems  Constitutional: Negative.   HENT: Negative.   Eyes: Negative.   Respiratory: Negative.   Cardiovascular: Negative.   Gastrointestinal: Negative.   Genitourinary: Negative.   Musculoskeletal: Negative.   Skin: Negative.   Neurological: Negative.   Endo/Heme/Allergies: Negative.   Psychiatric/Behavioral: Negative.     All other ROS negative except what is listed above and in the HPI.      Objective:    BP (!) 172/82   Pulse 66   Temp 98.6 F (37 C)   Ht 5\' 6"  (1.676 m)   Wt 207 lb 12.8 oz (94.3 kg)   SpO2 100%   BMI 33.54 kg/m   Wt Readings from Last 3 Encounters:  08/30/19 207 lb 12.8 oz (94.3 kg)  08/30/19 207 lb 12.8 oz (94.3 kg)  10/19/18 195 lb 12 oz (88.8 kg)    Physical Exam Vitals signs and nursing note reviewed.  Constitutional:      General: She is not in acute distress.    Appearance: Normal appearance. She is not ill-appearing, toxic-appearing or diaphoretic.  HENT:     Head: Normocephalic and atraumatic.     Right Ear: Tympanic membrane, ear canal and external ear normal. There is no impacted cerumen.     Left Ear: Tympanic membrane, ear canal and external ear normal. There is no impacted cerumen.     Nose: Nose normal. No congestion or rhinorrhea.     Mouth/Throat:     Mouth: Mucous membranes are moist.     Pharynx: Oropharynx is clear. No oropharyngeal exudate or posterior oropharyngeal erythema.  Eyes:     General: No scleral icterus.       Right eye: No discharge.        Left eye: No discharge.     Extraocular Movements: Extraocular movements intact.     Conjunctiva/sclera: Conjunctivae normal.     Pupils: Pupils are equal, round, and reactive to light.  Neck:     Musculoskeletal: Normal range of motion and neck supple. No neck rigidity or muscular  tenderness.     Vascular: No carotid bruit.  Cardiovascular:     Rate and Rhythm: Normal rate and regular rhythm.     Pulses: Normal pulses.     Heart sounds: No murmur. No friction rub. No gallop.   Pulmonary:     Effort: Pulmonary effort is normal. No respiratory distress.     Breath sounds: Normal breath sounds. No stridor. No wheezing, rhonchi or  rales.  Chest:     Chest wall: No tenderness.  Abdominal:     General: Abdomen is flat. Bowel sounds are normal. There is no distension.     Palpations: Abdomen is soft. There is no mass.     Tenderness: There is no abdominal tenderness. There is no right CVA tenderness, left CVA tenderness, guarding or rebound.     Hernia: No hernia is present.  Genitourinary:    Comments: Breast and pelvic exams deferred with shared decision making Musculoskeletal:        General: No swelling, tenderness, deformity or signs of injury.     Right lower leg: No edema.     Left lower leg: No edema.  Lymphadenopathy:     Cervical: No cervical adenopathy.  Skin:    General: Skin is warm and dry.     Capillary Refill: Capillary refill takes less than 2 seconds.     Coloration: Skin is not jaundiced or pale.     Findings: No bruising, erythema, lesion or rash.  Neurological:     General: No focal deficit present.     Mental Status: She is alert and oriented to person, place, and time. Mental status is at baseline.     Cranial Nerves: No cranial nerve deficit.     Sensory: No sensory deficit.     Motor: No weakness.     Coordination: Coordination normal.     Gait: Gait normal.     Deep Tendon Reflexes: Reflexes normal.  Psychiatric:        Mood and Affect: Mood normal.        Behavior: Behavior normal.        Thought Content: Thought content normal.        Judgment: Judgment normal.     Results for orders placed or performed in visit on 123456  Basic metabolic panel  Result Value Ref Range   Glucose 82 65 - 99 mg/dL   BUN 30 (H) 8 - 27 mg/dL    Creatinine, Ser 0.95 0.57 - 1.00 mg/dL   GFR calc non Af Amer 58 (L) >59 mL/min/1.73   GFR calc Af Amer 67 >59 mL/min/1.73   BUN/Creatinine Ratio 32 (H) 12 - 28   Sodium 142 134 - 144 mmol/L   Potassium 4.6 3.5 - 5.2 mmol/L   Chloride 104 96 - 106 mmol/L   CO2 23 20 - 29 mmol/L   Calcium 9.1 8.7 - 10.3 mg/dL      Assessment & Plan:   Problem List Items Addressed This Visit      Cardiovascular and Mediastinum   HTN (hypertension)    Running high today- but runs good at home. Continue to monitor it at home if sneaking up above 140s-150s, let us know. Refills given today.      Relevant Medications   rivaroxaban (XARELTO) 20 MG TABS tablet   metoprolol succinate (TOPROL-XL) 25 MG 24 hr tablet   losartan (COZAAR) 100 MG tablet   Other Relevant Orders   Comprehensive metabolic panel   Microalbumin, Urine Waived   UA/M w/rflx Culture, Routine   Atrial fibrillation (HCC)    Under good control on current regimen. Continue current regimen. Continue to monitor. Call with any concerns. Refills given. Continue to follow with cardiology.         Relevant Medications   rivaroxaban (XARELTO) 20 MG TABS tablet   metoprolol succinate (TOPROL-XL) 25 MG 24 hr tablet   losartan (COZAAR) 100 MG tablet  Other Relevant Orders   CBC with Differential/Platelet   Comprehensive metabolic panel   TSH    Other Visit Diagnoses    Routine general medical examination at a health care facility    -  Primary   Vaccines up to date. Screening labs checked today. Continue diet and exercise. Call with any concerns.    Screening for cholesterol level       Labs drawn today.   Relevant Orders   Lipid Panel w/o Chol/HDL Ratio       Follow up plan: Return in about 6 months (around 02/28/2020).   LABORATORY TESTING:  - Pap smear: not applicable  IMMUNIZATIONS:   - Tdap: Tetanus vaccination status reviewed: last tetanus booster within 10 years. - Influenza: Administered today - Pneumovax: Up to  date - Prevnar: Up to date  SCREENING: -Mammogram: Not applicable  - Colonoscopy: Not applicable  - Bone Density: Refused   PATIENT COUNSELING:   Advised to take 1 mg of folate supplement per day if capable of pregnancy.   Sexuality: Discussed sexually transmitted diseases, partner selection, use of condoms, avoidance of unintended pregnancy  and contraceptive alternatives.   Advised to avoid cigarette smoking.  I discussed with the patient that most people either abstain from alcohol or drink within safe limits (<=14/week and <=4 drinks/occasion for males, <=7/weeks and <= 3 drinks/occasion for females) and that the risk for alcohol disorders and other health effects rises proportionally with the number of drinks per week and how often a drinker exceeds daily limits.  Discussed cessation/primary prevention of drug use and availability of treatment for abuse.   Diet: Encouraged to adjust caloric intake to maintain  or achieve ideal body weight, to reduce intake of dietary saturated fat and total fat, to limit sodium intake by avoiding high sodium foods and not adding table salt, and to maintain adequate dietary potassium and calcium preferably from fresh fruits, vegetables, and low-fat dairy products.    stressed the importance of regular exercise  Injury prevention: Discussed safety belts, safety helmets, smoke detector, smoking near bedding or upholstery.   Dental health: Discussed importance of regular tooth brushing, flossing, and dental visits.    NEXT PREVENTATIVE PHYSICAL DUE IN 1 YEAR. Return in about 6 months (around 02/28/2020).

## 2019-08-31 ENCOUNTER — Encounter: Payer: Self-pay | Admitting: Family Medicine

## 2019-08-31 LAB — CBC WITH DIFFERENTIAL/PLATELET
Basophils Absolute: 0.1 10*3/uL (ref 0.0–0.2)
Basos: 1 %
EOS (ABSOLUTE): 0.3 10*3/uL (ref 0.0–0.4)
Eos: 3 %
Hematocrit: 41 % (ref 34.0–46.6)
Hemoglobin: 13.7 g/dL (ref 11.1–15.9)
Immature Grans (Abs): 0 10*3/uL (ref 0.0–0.1)
Immature Granulocytes: 0 %
Lymphocytes Absolute: 1.7 10*3/uL (ref 0.7–3.1)
Lymphs: 22 %
MCH: 30.2 pg (ref 26.6–33.0)
MCHC: 33.4 g/dL (ref 31.5–35.7)
MCV: 90 fL (ref 79–97)
Monocytes Absolute: 0.6 10*3/uL (ref 0.1–0.9)
Monocytes: 8 %
Neutrophils Absolute: 5.1 10*3/uL (ref 1.4–7.0)
Neutrophils: 66 %
Platelets: 333 10*3/uL (ref 150–450)
RBC: 4.54 x10E6/uL (ref 3.77–5.28)
RDW: 12.9 % (ref 11.7–15.4)
WBC: 7.6 10*3/uL (ref 3.4–10.8)

## 2019-08-31 LAB — COMPREHENSIVE METABOLIC PANEL
ALT: 14 IU/L (ref 0–32)
AST: 24 IU/L (ref 0–40)
Albumin/Globulin Ratio: 1.7 (ref 1.2–2.2)
Albumin: 4.4 g/dL (ref 3.7–4.7)
Alkaline Phosphatase: 99 IU/L (ref 39–117)
BUN/Creatinine Ratio: 18 (ref 12–28)
BUN: 14 mg/dL (ref 8–27)
Bilirubin Total: 0.4 mg/dL (ref 0.0–1.2)
CO2: 26 mmol/L (ref 20–29)
Calcium: 9.7 mg/dL (ref 8.7–10.3)
Chloride: 101 mmol/L (ref 96–106)
Creatinine, Ser: 0.79 mg/dL (ref 0.57–1.00)
GFR calc Af Amer: 83 mL/min/{1.73_m2} (ref >59)
GFR calc non Af Amer: 72 mL/min/{1.73_m2} (ref >59)
Globulin, Total: 2.6 g/dL (ref 1.5–4.5)
Glucose: 106 mg/dL — ABNORMAL HIGH (ref 65–99)
Potassium: 5.1 mmol/L (ref 3.5–5.2)
Sodium: 142 mmol/L (ref 134–144)
Total Protein: 7 g/dL (ref 6.0–8.5)

## 2019-08-31 LAB — LIPID PANEL W/O CHOL/HDL RATIO
Cholesterol, Total: 174 mg/dL (ref 100–199)
HDL: 83 mg/dL (ref >39)
LDL Chol Calc (NIH): 82 mg/dL (ref 0–99)
Triglycerides: 42 mg/dL (ref 0–149)
VLDL Cholesterol Cal: 9 mg/dL (ref 5–40)

## 2019-08-31 LAB — TSH: TSH: 1.26 u[IU]/mL (ref 0.450–4.500)

## 2019-11-25 ENCOUNTER — Telehealth: Payer: Self-pay | Admitting: Cardiovascular Disease

## 2019-11-25 NOTE — Telephone Encounter (Signed)
Error

## 2019-11-29 ENCOUNTER — Encounter: Payer: Self-pay | Admitting: Family

## 2019-11-29 ENCOUNTER — Other Ambulatory Visit: Payer: Self-pay

## 2019-11-29 ENCOUNTER — Ambulatory Visit (INDEPENDENT_AMBULATORY_CARE_PROVIDER_SITE_OTHER): Payer: Medicare HMO | Admitting: Family

## 2019-11-29 VITALS — BP 168/78 | HR 60 | Resp 18 | Ht 67.0 in | Wt 219.5 lb

## 2019-11-29 DIAGNOSIS — I4821 Permanent atrial fibrillation: Secondary | ICD-10-CM

## 2019-11-29 DIAGNOSIS — Z79899 Other long term (current) drug therapy: Secondary | ICD-10-CM | POA: Diagnosis not present

## 2019-11-29 DIAGNOSIS — I872 Venous insufficiency (chronic) (peripheral): Secondary | ICD-10-CM

## 2019-11-29 DIAGNOSIS — I10 Essential (primary) hypertension: Secondary | ICD-10-CM

## 2019-11-29 DIAGNOSIS — Z7901 Long term (current) use of anticoagulants: Secondary | ICD-10-CM

## 2019-11-29 MED ORDER — LOSARTAN POTASSIUM 100 MG PO TABS: ORAL_TABLET | ORAL | 3 refills | Status: DC

## 2019-11-29 MED ORDER — RIVAROXABAN 20 MG PO TABS: ORAL_TABLET | ORAL | 3 refills | Status: DC

## 2019-11-29 MED ORDER — METOPROLOL SUCCINATE ER 25 MG PO TB24: ORAL_TABLET | ORAL | 3 refills | Status: DC

## 2019-11-29 NOTE — Progress Notes (Addendum)
Office Visit    Patient Name: [name] Date of Encounter: 11/29/2019  Primary Care Provider:  [name], DO Primary Cardiologist:  Ida Rogue, MD Electrophysiologist:  None   Chief Complaint    [name] is a 79 y.o. female with a hx of HTN, chronic atrial fibrillation on [place] presents today for annual follow-up of her atrial fibrillation.  Past Medical History    Past Medical History:  Diagnosis Date  . Cat bite of right lower leg [id number]  . Cellulitis of lower leg 12/07/2014  . Hypertension    History reviewed. No pertinent surgical history.  Allergies  Allergies  Allergen Reactions  . Amlodipine     Leg swelling  . Hctz [Hydrochlorothiazide] Nausea Only and Rash    History of Present Illness    [name] is a 79 y.o. female with a hx of atrial fibrillation, HTN, RBBB, remote history of tobacco abuse last seen 10/19/2018 by Dr. Rockey Situ.  Her blood pressure has been labile in the past.  She has a longstanding history of lower extremity edema.  She has previously had ulcers infections that were evaluated by the vein center and wound center. Of note she had previous swelling on amlodipine.  [name] presents today independently for office visit.  She mentioned that her daughter often times will come to her appointments but was not able to today.  We offered to call the daughter however she declined to allow [place] to call with the daughter.  She did have some confusion getting to where the appointment and got lost.  There is marked confusion regarding her diagnoses and medications today.  She is in permanent atrial fibrillation however seems unaware of a diagnosis of irregular heartbeat.  Her appearance is somewhat unkept today.  We discussed the risk of stroke and need for blood thinner but she tells me "someone took her off that medicine".  Very concerned that she is noncompliant with her Xarelto in the setting of atrial fibrillation with a CHA2DS2-VASc  of at least 4.  Tells me her blood pressure is "very low" sometimes and "very high" other times. Tells me she normally sees her blood pressure in the 130s, but has difficulty giving specific numbers.  She did not bring a log nor her blood pressure cuff today.  And it is "way below" sometimes.   Denies chest pain, pressure, tightness.  Reports no SOB, DOE.  Reports her lower extremity edema has been improving.  She tells me her daughter does live with her as well as her son-in-law.  However she tells me she manages her medications herself and uses a pillbox.  EKGs/Labs/Other Studies Reviewed:   The following studies were reviewed today:  EKG:  EKG is ordered today.  The ekg ordered today demonstrates rate controlled atrial fibrillation rate 58 beats per minute with right bundle branch block  Recent Labs: 08/30/2019: ALT 14; BUN 14; Creatinine, Ser 0.79; Hemoglobin 13.7; Platelets 333; Potassium 5.1; Sodium 142; TSH 1.260  Recent Lipid Panel    Component Value Date/Time   CHOL 174 08/30/2019 1338   TRIG 42 08/30/2019 1338   HDL 83 08/30/2019 1338   LDLCALC 82 08/30/2019 1338    Home Medications   Current Meds  Medication Sig  . losartan (COZAAR) 100 MG tablet TAKE 1 TABLET(100 MG) BY MOUTH DAILY  . metoprolol succinate (TOPROL-XL) 25 MG 24 hr tablet TAKE 1 TABLET BY MOUTH DAILY. TAKE WITH OR IMMEDIATELY FOLLOWING A MEAL  . [  DISCONTINUED] losartan (COZAAR) 100 MG tablet TAKE 1 TABLET(100 MG) BY MOUTH DAILY  . [DISCONTINUED] metoprolol succinate (TOPROL-XL) 25 MG 24 hr tablet TAKE 1 TABLET BY MOUTH DAILY. TAKE WITH OR IMMEDIATELY FOLLOWING A MEAL    Review of Systems    Review of Systems  Constitution: Negative for chills, fever and malaise/fatigue.  Cardiovascular: Negative for chest pain, dyspnea on exertion, irregular heartbeat, leg swelling, near-syncope, orthopnea, palpitations and syncope.  Respiratory: Negative for cough, shortness of breath and wheezing.     Gastrointestinal: Negative for melena, nausea and vomiting.  Genitourinary: Negative for hematuria.  Neurological: Negative for dizziness, light-headedness and weakness.   All other systems reviewed and are otherwise negative except as noted above.  Physical Exam    VS:  BP (!) 168/78 (BP Location: Left Arm, Patient Position: Supine, Cuff Size: Normal)   Pulse 60   Resp 18   Ht 5\' 7"  (1.702 m)   Wt 219 lb 8 oz (99.6 kg)   SpO2 98%   BMI 34.38 kg/m  , BMI Body mass index is 34.38 kg/m. GEN: Well nourished, well developed, in no acute distress. Unkempt appearance. HEENT: normal. Neck: Supple, no JVD, carotid bruits, or masses. Cardiac: irregularly irregular, no murmurs, rubs, or gallops. No clubbing, cyanosis, edema.  Radials/PT 2+ and equal bilaterally.  Respiratory:  Respirations regular and unlabored, clear to auscultation bilaterally. GI: Soft, nontender, nondistended.. MS: No deformity or atrophy. Skin: Warm and dry, no rash. Neuro:  Strength and sensation are intact. Psych: Normal affect.  Accessory Clinical Findings    ECG personally reviewed by me today -  rate controlled atrial fibrillation rate 58 beats per minute with right bundle branch block - no acute changes.  Assessment & Plan    1. Permanent atrial fibrillation - Asymptomatic and rate controlled on EKG today.  Rate controlled on metoprolol.  Continue metoprolol succinate 25 mg daily.  Of note, despite this being permanent atrial fibrillation she was unaware of this condition and this had to be explained again.  2. Chronic anticoagulation -reports she has not been taking her Xarelto as someone "discontinued "it.  Marked confusion regarding her medications today.  I have asked her to resume Xarelto 20 mg every evening with dinner.  3. HTN -blood pressure elevated today.  Blood pressure appears to have been elevated at a number of recent office visit.  However, she reports "low blood pressure "at home but cannot  give reading.  Continue present losartan 100 mg daily.  As there is marked confusion regarding her medications will not make any additional medication changes at this time.  Will likely need additional antihypertensive agents at next office visit.  4. Medication management - Marked confusion regarding her medications today.  We offered to call her daughter for clarity, however the patient would not allow [place] to do so.  We will reach out to primary care to see if there are additional resources such as possibly care management.  5. Chronic venous insufficiency - Known varicose vein bilateral LE. No edema on exam.  Recommend elevating lower extremities when sitting.  Recommend low-sodium diet.  Disposition: Resume Xarelto. Follow up in 1 month(s) with Dr. Rockey Situ or APP for medication adherence check.  Loel Dubonnet, NP 11/29/2019, 4:56 PM

## 2019-11-29 NOTE — Patient Instructions (Addendum)
Medication Instructions:  Your physician has recommended you make the following change in your medication:   RESUME your Rivaroxaban (Xarelto) 20mg  one tablet daily with dinner  CONTINUE Losartan (Cozaar) 100mg  one tablet daily  CONTINUE Metoprolol Succinate (Toprol XL) 25 mg one tablet daily  *If you need a refill on your cardiac medications before your next appointment, please call your pharmacy*  Lab Work: No lab work today.   Testing/Procedures: You had an EKG today. It showed rate controlled atrial fibrillation.  This is an irregular heart rhythm that is in the top chambers of your heart. You have had this for a number of years. This places you at risk for a stroke. We protect you from stroke using a blood thinner, Xarelto. Please resume this medication.   Follow-Up: At [facility], you and your health needs are our priority.  As part of our continuing mission to provide you with exceptional heart care, we have created designated Provider Care Teams.  These Care Teams include your primary Cardiologist (physician) and Advanced Practice Providers (APPs -  Physician Assistants and Nurse Practitioners) who all work together to provide you with the care you need, when you need it.  Your next appointment:   1 month(s)  The format for your next appointment:   In Person  Provider:    You may see Ida Rogue, MD or one of the following Advanced Practice Providers on your designated Care Team:    Murray Hodgkins, NP  Christell Faith, PA-C  Marrianne Mood, PA-C   Other Instructions

## 2019-11-30 ENCOUNTER — Telehealth: Payer: Self-pay | Admitting: Family Medicine

## 2019-11-30 ENCOUNTER — Other Ambulatory Visit: Payer: Self-pay | Admitting: Family Medicine

## 2019-11-30 DIAGNOSIS — I4821 Permanent atrial fibrillation: Secondary | ICD-10-CM

## 2019-11-30 NOTE — Chronic Care Management (AMB) (Signed)
  Chronic Care Management   Outreach Note  11/30/2019 Name: [name] MRN: [id number] DOB: August 23, 1941  [name] is a 79 y.o. year old female who is a primary care patient of [name], DO. I reached out to [place] by phone today in response to a referral sent by [name]'s PCP, Park Liter DO     An unsuccessful telephone outreach was attempted today. The patient was referred to the case management team by for assistance with care management and care coordination.   Follow Up Plan: A HIPPA compliant phone message was left for the patient providing contact information and requesting a return call.  The care management team will reach out to the patient again over the next 7 days.  If patient returns call to provider office, please advise to call Embedded Care Management Care Guide Glenna Durand LPN at [phone]  Fontaine Kossman, LPN Health Advisor, [place] Management ??[email or web address] ??[phone]

## 2019-11-30 NOTE — Progress Notes (Signed)
Ref

## 2019-12-07 NOTE — Chronic Care Management (AMB) (Signed)
  Chronic Care Management   Note  12/07/2019 Name: [name] MRN: [id number] DOB: 16-Aug-1941  [name] is a 79 y.o. year old female who is a primary care patient of [name], DO. I reached out to [place] by phone today in response to a referral sent by [name]'s PCP, Park Liter DO     Ms. Strole was given information about Chronic Care Management services today including:  1. CCM service includes personalized support from designated clinical staff supervised by her physician, including individualized plan of care and coordination with other care providers 2. 24/7 contact phone numbers for assistance for urgent and routine care needs. 3. Service will only be billed when office clinical staff spend 20 minutes or more in a month to coordinate care. 4. Only one practitioner may furnish and bill the service in a calendar month. 5. The patient may stop CCM services at any time (effective at the end of the month) by phone call to the office staff. 6. The patient will be responsible for cost sharing (co-pay) of up to 20% of the service fee (after annual deductible is met).  Patient did not agree to enrollment in care management services and does not wish to consider at this time.  Follow up plan: The care management team is available to follow up with the patient after provider conversation with the patient regarding recommendation for care management engagement and subsequent re-referral to the care management team.   Glenna Durand, LPN Health Advisor, [place] Management ??[email or web address] ??[phone]

## 2020-01-09 NOTE — Progress Notes (Deleted)
Cardiology Office Note  Date:  01/09/2020   ID:  [name], DOB April 22, 1941, MRN [id number]  PCP:  [name], DO   No chief complaint on file.   HPI:  [name] is a 79 year old woman,  with history of labile blood pressure  chronic atrial fibrillation.  who presents for follow-up of her hypertension and chronic atrial fibrillation.   In follow-up today she reports continued leg swelling but it is stable, no ulcers, no infections as she has had in the past Previously was seen by the vein center, wound center Legs have healed Continues to have varicose veins  Some medication confusion, Lasix and potassium is on her list We did call the pharmacy on today's visit and they say that she has not filled these  Chronic knee pain, seems to come and go with swelling  Reports she is active but no regular exercise program Walks the driveway to get mail Denies any leg weakness  Troubled by her weight gain, changed her diet low carbohydrate in the evening On prior office visit was eating candy  EKG personally reviewed by myself on todays visit Shows atrial fibrillation ventricular rate 57 bpm right bundle branch block  Prior EKG reviewed showing heart rate 52  Other past medical history reviewed Echocardiogram done recently showing severely dilated left atrium, elevated right ventricular systolic pressure, normal ejection fraction.  Remote history of smoking, not for 30 years Husband died several years ago No regular exercise program Reports her blood pressure is labile, sometimes low at home  previous swelling on amlodpine  PMH:   has a past medical history of Cat bite of right lower leg ([id number]), Cellulitis of lower leg (12/07/2014), and Hypertension.  PSH:   No past surgical history on file.  Current Outpatient Medications  Medication Sig Dispense Refill  . diclofenac sodium (VOLTAREN) 1 % GEL Apply 4 g topically 4 (four) times daily. 100 g 12  . furosemide  (LASIX) 20 MG tablet Take 20 mg by mouth.    . losartan (COZAAR) 100 MG tablet TAKE 1 TABLET(100 MG) BY MOUTH DAILY 90 tablet 3  . metoprolol succinate (TOPROL-XL) 50 MG 24 hr tablet TAKE 1 TABLET BY MOUTH DAILY. TAKE WITH OR IMMEDIATELY FOLLOWING A MEAL 90 tablet 3  . potassium chloride (KLOR-CON) 20 MEQ packet Take 20 mEq by mouth daily.     . rivaroxaban (XARELTO) 20 MG TABS tablet TAKE 1 TABLET BY MOUTH EVERY DAY WITH DINNER 90 tablet 3   No current facility-administered medications for this visit.      Allergies:   Amlodipine and Hctz [hydrochlorothiazide]   Social History:  The patient  reports that she quit smoking about 41 years ago. She has never used smokeless tobacco. She reports that she does not drink alcohol or use drugs.   Family History:   family history includes Alcohol abuse in her brother and father; Heart disease in her father; Hyperlipidemia in her sister; Hypertension in her brother, father, mother, and sister.    Review of Systems: Review of Systems  Constitutional: Negative.   Respiratory: Negative.   Cardiovascular: Positive for leg swelling.  Gastrointestinal: Negative.   Musculoskeletal: Positive for joint pain.  Neurological: Negative.   Psychiatric/Behavioral: Negative.   All other systems reviewed and are negative.    PHYSICAL EXAM: VS:  There were no vitals taken for this visit. , BMI There is no height or weight on file to calculate BMI. Constitutional:  oriented to person, place, and time.  No distress.  HENT:  Head: Grossly normal Eyes:  no discharge. No scleral icterus.  Neck: No JVD, no carotid bruits  Cardiovascular: Irregularly irregular,  no murmurs appreciated Pulmonary/Chest: Clear to auscultation bilaterally, no wheezes or rails Abdominal: Soft.  no distension.  no tenderness.  Musculoskeletal: Normal range of motion Neurological:  normal muscle tone. Coordination normal. No atrophy Skin: Skin warm and dry Psychiatric: normal affect,  pleasant  Recent Labs: 08/30/2019: ALT 14; BUN 14; Creatinine, Ser 0.79; Hemoglobin 13.7; Platelets 333; Potassium 5.1; Sodium 142; TSH 1.260    Lipid Panel Lab Results  Component Value Date   CHOL 174 08/30/2019   HDL 83 08/30/2019   LDLCALC 82 08/30/2019   TRIG 42 08/30/2019      Wt Readings from Last 3 Encounters:  11/29/19 219 lb 8 oz (99.6 kg)  08/30/19 207 lb 12.8 oz (94.3 kg)  08/30/19 207 lb 12.8 oz (94.3 kg)     ASSESSMENT AND PLAN:  Chronic atrial fibrillation (HCC) - Plan: EKG 12-Lead Rate running slow We will decrease metoprolol down to 25 mg daily  Essential hypertension Metoprolol down to 25 daily Some medication confusion, Lasix potassium still on her list but she does not know if she is taking this We did call her pharmacy and they have not filled this in long time We will call her and confirm and remove these medications from her list  Chronic venous insufficiency Recommend compression hose She is only wearing pantyhose Symptoms are stable  Varicose veins of lower extremity with ulcer, unspecified laterality ([place]) Ulcerations healed Recommended leg elevation and compressions  Weight gain Weight running high but she is monitoring her diet   Total encounter time more than 25 minutes  Greater than 50% was spent in counseling and coordination of care with the patient  Disposition:   F/U  12 months   No orders of the defined types were placed in this encounter.    Signed, Esmond Plants, M.D., Ph.D. 01/09/2020  Webster, Lisa

## 2020-01-10 ENCOUNTER — Ambulatory Visit: Payer: Medicare HMO | Admitting: Cardiovascular Disease

## 2020-01-11 ENCOUNTER — Encounter: Payer: Self-pay | Admitting: Cardiovascular Disease

## 2020-01-15 DIAGNOSIS — D6869 Other thrombophilia: Secondary | ICD-10-CM | POA: Diagnosis not present

## 2020-01-15 DIAGNOSIS — I10 Essential (primary) hypertension: Secondary | ICD-10-CM | POA: Diagnosis not present

## 2020-01-15 DIAGNOSIS — Z008 Encounter for other general examination: Secondary | ICD-10-CM | POA: Diagnosis not present

## 2020-01-15 DIAGNOSIS — Z7901 Long term (current) use of anticoagulants: Secondary | ICD-10-CM | POA: Diagnosis not present

## 2020-01-15 DIAGNOSIS — M810 Age-related osteoporosis without current pathological fracture: Secondary | ICD-10-CM | POA: Diagnosis not present

## 2020-01-15 DIAGNOSIS — K08409 Partial loss of teeth, unspecified cause, unspecified class: Secondary | ICD-10-CM | POA: Diagnosis not present

## 2020-01-15 DIAGNOSIS — I4891 Unspecified atrial fibrillation: Secondary | ICD-10-CM | POA: Diagnosis not present

## 2020-01-15 DIAGNOSIS — R69 Illness, unspecified: Secondary | ICD-10-CM | POA: Diagnosis not present

## 2020-01-15 DIAGNOSIS — Z8249 Family history of ischemic heart disease and other diseases of the circulatory system: Secondary | ICD-10-CM | POA: Diagnosis not present

## 2020-01-15 DIAGNOSIS — R32 Unspecified urinary incontinence: Secondary | ICD-10-CM | POA: Diagnosis not present

## 2020-01-15 DIAGNOSIS — E669 Obesity, unspecified: Secondary | ICD-10-CM | POA: Diagnosis not present

## 2020-02-28 ENCOUNTER — Ambulatory Visit: Payer: Medicare HMO | Admitting: Family Medicine

## 2020-03-29 ENCOUNTER — Telehealth: Payer: Self-pay | Admitting: Cardiovascular Disease

## 2020-03-29 NOTE — Telephone Encounter (Signed)
-----   Message from Loel Dubonnet, NP sent at 03/29/2020  8:47 AM EDT ----- Hello,  I saw [name] 11/2019 with concerns regarding medication adherence. She was scheduled for follow up with Dr. Rockey Situ 01/2020 which she no-showed. Can we reach out to her and see if we can get her on his schedule? (or APP would be fine, but she is okay to wait for [facility] appt). Appt notes 'follow up of atrial fib, medication management'. Thank so much!  Thanks so much,  Loel Dubonnet, NP

## 2020-03-29 NOTE — Telephone Encounter (Signed)
Attempted to schedule no ans no vm  

## 2020-08-07 NOTE — Progress Notes (Deleted)
Cardiology Office Note  Date:  08/07/2020   ID:  [name], DOB [birth year], MRN [id number]  PCP:  [name], DO   No chief complaint on file.   HPI:  [name] is a 79 year old woman,  with history of labile blood pressure  chronic atrial fibrillation.  who presents for follow-up of her hypertension and chronic atrial fibrillation.   In follow-up today she reports continued leg swelling but it is stable, no ulcers, no infections as she has had in the past Previously was seen by the vein center, wound center Legs have healed Continues to have varicose veins  Some medication confusion, Lasix and potassium is on her list We did call the pharmacy on today's visit and they say that she has not filled these  Chronic knee pain, seems to come and go with swelling  Reports she is active but no regular exercise program Walks the driveway to get mail Denies any leg weakness  Troubled by her weight gain, changed her diet low carbohydrate in the evening On prior office visit was eating candy  EKG personally reviewed by myself on todays visit Shows atrial fibrillation ventricular rate 57 bpm right bundle branch block  Prior EKG reviewed showing heart rate 52  Other past medical history reviewed Echocardiogram done recently showing severely dilated left atrium, elevated right ventricular systolic pressure, normal ejection fraction.  Remote history of smoking, not for 30 years Husband died several years ago No regular exercise program Reports her blood pressure is labile, sometimes low at home  previous swelling on amlodpine  PMH:   has a past medical history of Cat bite of right lower leg ([id number]), Cellulitis of lower leg (12/07/2014), and Hypertension.  PSH:   No past surgical history on file.  Current Outpatient Medications  Medication Sig Dispense Refill  . diclofenac sodium (VOLTAREN) 1 % GEL Apply 4 g topically 4 (four) times daily. 100 g 12  . furosemide  (LASIX) 20 MG tablet Take 20 mg by mouth.    . losartan (COZAAR) 100 MG tablet TAKE 1 TABLET(100 MG) BY MOUTH DAILY 90 tablet 3  . metoprolol succinate (TOPROL-XL) 50 MG 24 hr tablet TAKE 1 TABLET BY MOUTH DAILY. TAKE WITH OR IMMEDIATELY FOLLOWING A MEAL 90 tablet 3  . potassium chloride (KLOR-CON) 20 MEQ packet Take 20 mEq by mouth daily.     . rivaroxaban (XARELTO) 20 MG TABS tablet TAKE 1 TABLET BY MOUTH EVERY DAY WITH DINNER 90 tablet 3   No current facility-administered medications for this visit.      Allergies:   Amlodipine and Hctz [hydrochlorothiazide]   Social History:  The patient  reports that she quit smoking about 41 years ago. She has never used smokeless tobacco. She reports that she does not drink alcohol and does not use drugs.   Family History:   family history includes Alcohol abuse in her brother and father; Heart disease in her father; Hyperlipidemia in her sister; Hypertension in her brother, father, mother, and sister.    Review of Systems: Review of Systems  Constitutional: Negative.   Respiratory: Negative.   Cardiovascular: Positive for leg swelling.  Gastrointestinal: Negative.   Musculoskeletal: Positive for joint pain.  Neurological: Negative.   Psychiatric/Behavioral: Negative.   All other systems reviewed and are negative.    PHYSICAL EXAM: VS:  There were no vitals taken for this visit. , BMI There is no height or weight on file to calculate BMI. Constitutional:  oriented to person, place,  and time. No distress.  HENT:  Head: Grossly normal Eyes:  no discharge. No scleral icterus.  Neck: No JVD, no carotid bruits  Cardiovascular: Irregularly irregular,  no murmurs appreciated Pulmonary/Chest: Clear to auscultation bilaterally, no wheezes or rails Abdominal: Soft.  no distension.  no tenderness.  Musculoskeletal: Normal range of motion Neurological:  normal muscle tone. Coordination normal. No atrophy Skin: Skin warm and dry Psychiatric:  normal affect, pleasant  Recent Labs: 08/30/2019: ALT 14; BUN 14; Creatinine, Ser 0.79; Hemoglobin 13.7; Platelets 333; Potassium 5.1; Sodium 142; TSH 1.260    Lipid Panel Lab Results  Component Value Date   CHOL 174 08/30/2019   HDL 83 08/30/2019   LDLCALC 82 08/30/2019   TRIG 42 08/30/2019      Wt Readings from Last 3 Encounters:  11/29/19 219 lb 8 oz (99.6 kg)  08/30/19 207 lb 12.8 oz (94.3 kg)  08/30/19 207 lb 12.8 oz (94.3 kg)     ASSESSMENT AND PLAN:  Chronic atrial fibrillation (HCC) - Plan: EKG 12-Lead Rate running slow We will decrease metoprolol down to 25 mg daily  Essential hypertension Metoprolol down to 25 daily Some medication confusion, Lasix potassium still on her list but she does not know if she is taking this We did call her pharmacy and they have not filled this in long time We will call her and confirm and remove these medications from her list  Chronic venous insufficiency Recommend compression hose She is only wearing pantyhose Symptoms are stable  Varicose veins of lower extremity with ulcer, unspecified laterality ([place]) Ulcerations healed Recommended leg elevation and compressions  Weight gain Weight running high but she is monitoring her diet   Total encounter time more than 25 minutes  Greater than 50% was spent in counseling and coordination of care with the patient  Disposition:   F/U  12 months   No orders of the defined types were placed in this encounter.    Signed, Esmond Plants, M.D., Ph.D. 08/07/2020  [place], [place]

## 2020-08-08 ENCOUNTER — Ambulatory Visit: Payer: Medicare HMO | Admitting: Cardiovascular Disease

## 2020-08-23 ENCOUNTER — Telehealth: Payer: Self-pay | Admitting: Family Medicine

## 2020-08-23 NOTE — Telephone Encounter (Signed)
Copied from [place] [id number]. Topic: Medicare AWV >> Aug 23, 2020  1:20 PM Cher Nakai R wrote: Reason for CRM:   Change in [facility] schedule -No answer unable to leave patient message about needing to reschedule appointment & to make it by phone-srs

## 2020-08-31 ENCOUNTER — Ambulatory Visit: Payer: Medicare HMO

## 2021-01-02 ENCOUNTER — Ambulatory Visit: Payer: Self-pay | Admitting: *Deleted

## 2021-01-02 NOTE — Telephone Encounter (Signed)
Patient's son in law called to report patient has had a rash less than a week and it appears to be spreading. Able to speak to patient and patient required directions to answer questions appropriately. Patient did not report which arm rash was located. With assistance of son in law and patient c/o left eye red and "puffy" and now noted redness , light rash on fingers, arm elblow, chest and under arm. Denies fever, itching, fluid filled blisters. Reports rash as red small bumps. Denies pain , difficulty breathing. Attempted to get appt today but patient refused. Son in law states patient does not get paid until March 9 and refused today. Son in law did agree to 01/05/21. Son in law gave patient a benadryl and will see if symptoms decrease. Care advise given. Son in law verbalized understanding of care advise and to call back or go to [facility] or ED if symptoms worsen.   Reason for Disposition . Mild widespread rash  Answer Assessment - Initial Assessment Questions 1. APPEARANCE of RASH: "Describe the rash." (e.g., spots, blisters, raised areas, skin peeling, scaly)     Red rash small bumps not fluid filled. 2. SIZE: "How big are the spots?" (e.g., tip of pen, eraser, coin; inches, centimeters)     "small" 3. LOCATION: "Where is the rash located?"     Left eye, arm, elbow, under arm . Patient did not specify right or left side . 4. COLOR: "What color is the rash?" (Note: It is difficult to assess rash color in people with darker-colored skin. When this situation occurs, simply ask the caller to describe what they see.)     Red, pink 5. ONSET: "When did the rash begin?"     Not quite a week ago  6. FEVER: "Do you have a fever?" If Yes, ask: "What is your temperature, how was it measured, and when did it start?"     denies 7. ITCHING: "Does the rash itch?" If Yes, ask: "How bad is the itch?" (Scale 1-10; or mild, moderate, severe)     no 8. CAUSE: "What do you think is causing the rash?"     Not sure  9.  MEDICATION FACTORS: "Have you started any new medications within the last 2 weeks?" (e.g., antibiotics)      na 10. OTHER SYMPTOMS: "Do you have any other symptoms?" (e.g., dizziness, headache, sore throat, joint pain)       Denies  11. PREGNANCY: "Is there any chance you are pregnant?" "When was your last menstrual period?"       na  Protocols used: RASH OR REDNESS - [facility]

## 2021-01-05 ENCOUNTER — Other Ambulatory Visit: Payer: Self-pay

## 2021-01-05 ENCOUNTER — Encounter: Payer: Self-pay | Admitting: Family Medicine

## 2021-01-05 ENCOUNTER — Telehealth: Payer: Self-pay | Admitting: *Deleted

## 2021-01-05 ENCOUNTER — Ambulatory Visit (INDEPENDENT_AMBULATORY_CARE_PROVIDER_SITE_OTHER): Payer: Medicare HMO | Admitting: Family Medicine

## 2021-01-05 VITALS — BP 168/70 | HR 66 | Temp 98.3°F | Wt 221.8 lb

## 2021-01-05 DIAGNOSIS — R609 Edema, unspecified: Secondary | ICD-10-CM

## 2021-01-05 DIAGNOSIS — R21 Rash and other nonspecific skin eruption: Secondary | ICD-10-CM

## 2021-01-05 DIAGNOSIS — R739 Hyperglycemia, unspecified: Secondary | ICD-10-CM | POA: Diagnosis not present

## 2021-01-05 DIAGNOSIS — I4821 Permanent atrial fibrillation: Secondary | ICD-10-CM | POA: Diagnosis not present

## 2021-01-05 DIAGNOSIS — I10 Essential (primary) hypertension: Secondary | ICD-10-CM | POA: Diagnosis not present

## 2021-01-05 DIAGNOSIS — R413 Other amnesia: Secondary | ICD-10-CM | POA: Diagnosis not present

## 2021-01-05 DIAGNOSIS — Z13220 Encounter for screening for lipoid disorders: Secondary | ICD-10-CM

## 2021-01-05 DIAGNOSIS — I872 Venous insufficiency (chronic) (peripheral): Secondary | ICD-10-CM | POA: Diagnosis not present

## 2021-01-05 LAB — URINALYSIS, ROUTINE W REFLEX MICROSCOPIC
Bilirubin, UA: NEGATIVE
Glucose, UA: NEGATIVE
Ketones, UA: NEGATIVE
Nitrite, UA: NEGATIVE
Protein,UA: NEGATIVE
Specific Gravity, UA: 1.02 (ref 1.005–1.030)
Urobilinogen, Ur: 0.2 mg/dL (ref 0.2–1.0)
pH, UA: 5.5 (ref 5.0–7.5)

## 2021-01-05 LAB — MICROSCOPIC EXAMINATION
Bacteria, UA: NONE SEEN
Epithelial Cells (non renal): NONE SEEN /hpf (ref 0–10)
RBC, Urine: NONE SEEN /hpf (ref 0–2)

## 2021-01-05 LAB — MICROALBUMIN, URINE WAIVED
Creatinine, Urine Waived: 100 mg/dL (ref 10–300)
Microalb, Ur Waived: 10 mg/L (ref 0–19)
Microalb/Creat Ratio: <30 mg/g (ref <30)

## 2021-01-05 LAB — BAYER DCA HB A1C WAIVED: HB A1C (BAYER DCA - WAIVED): 5.4 % (ref <7.0)

## 2021-01-05 MED ORDER — LOSARTAN POTASSIUM 50 MG PO TABS: 50.0000 mg | ORAL_TABLET | Freq: Every day | ORAL | 3 refills | Status: DC

## 2021-01-05 MED ORDER — PREDNISONE 10 MG PO TABS: ORAL_TABLET | ORAL | 0 refills | Status: DC

## 2021-01-05 MED ORDER — RIVAROXABAN 20 MG PO TABS: ORAL_TABLET | ORAL | 1 refills | Status: DC

## 2021-01-05 MED ORDER — TRIAMCINOLONE ACETONIDE 40 MG/ML IJ SUSP
40.0000 mg | Freq: Once | INTRAMUSCULAR | Status: AC
  Administered 2021-01-05: 40 mg via INTRAMUSCULAR

## 2021-01-05 NOTE — Assessment & Plan Note (Addendum)
Will get her back into vascular surgeon. Call with any concerns. Continue to monitor. Encouraged her to restart her compression hose.

## 2021-01-05 NOTE — Assessment & Plan Note (Signed)
Of unclear etiology. Will treat with triamcinalone shot today and prednisone tomorrow. Call with any concerns. Recheck 2 weeks.

## 2021-01-05 NOTE — Assessment & Plan Note (Signed)
Not doing well. Has been off her meds. Will restart 50mg  losartan and recheck 2 weeks. Call with any concerns.

## 2021-01-05 NOTE — Progress Notes (Signed)
BP (!) 168/70 (BP Location: Left Arm, Patient Position: Sitting)   Pulse 66   Temp 98.3 F (36.8 C)   Wt 221 lb 12.8 oz (100.6 kg)   SpO2 98%   BMI 34.74 kg/m    Subjective:    Patient ID: [name], female    DOB: January 16, 1941, 80 y.o.   MRN: [id number]  HPI: [name] is a 80 y.o. female who presents today after being lost to follow up for 2 years.   Chief Complaint  Patient presents with  . Rash    Patient has 1 week with rash on arms and face. About a month ago the family noticed a red area on foot, has cleared up a little bit. Now has another red area on feet.    [name] presents today with her granddaughter and grandson-in-law who recently moved in with her. They have been concerned about her. She has stopped all of her medicine over the past year. She stopped her xarelto due to having an episode of bleeding and due to cost.   RASH- Rash x 1wk, arms, neck, eyes gave benadryl helped swelling, no recent change in soaps, unaware if she might have sprayed something and layed or walked in it. Used hydrogen peroxide and neosporin Duration:  1 week  Location: arms and chest  Itching: no Burning: no Redness: yes Oozing: no Scaling: yes Blisters: no Painful: no Fevers: no Change in detergents/soaps/personal care products: no Recent illness: no Recent travel:no History of same: yes Context: stable Alleviating factors: nothing Treatments attempted:hydrogen peroxide and neosprin Shortness of breath: no  Throat/tongue swelling: no Myalgias/arthralgias: no  HYPERTENSION Hypertension status: uncontrolled  Satisfied with current treatment? no Duration of hypertension: chronic BP monitoring frequency:  a few times a week BP range: 120s-150s/70s-80s BP medication side effects: N/A  Medication compliance: poor compliance Previous BP meds:metoprolol, amlodipine Aspirin: no Recurrent headaches: no Visual changes: no Palpitations: no Dyspnea: no Chest pain: no Lower  extremity edema: no Dizzy/lightheaded: no  ATRIAL FIBRILLATION Atrial fibrillation status: paroxysmal Satisfied with current treatment: yes  Medication side effects:  Has taken herself off all her medication Medication compliance: poor compliance Palpitations:  no Chest pain:  no Dyspnea on exertion:  no Orthopnea:  no Syncope:  no Edema:  yes Ventricular rate control: stopped metoprolol Anti-coagulation: stopped xarelto   She has been having leg swelling, wound top of left foot and right lateral ankle. Was using neosporin, now mupricin, l foot doing better. She has not seen her vascular surgeon in a few years.   Her family notes that she is getting more confused. They would like to have her see someone.    Mini-Cog - 01/05/21 1009    Normal clock drawing test? no    How many words correct? 0          Relevant past medical, surgical, family and social history reviewed and updated as indicated. Interim medical history since our last visit reviewed. Allergies and medications reviewed and updated.  Review of Systems  Constitutional: Negative.   HENT: Negative.   Respiratory: Negative.   Cardiovascular: Positive for leg swelling. Negative for chest pain and palpitations.  Gastrointestinal: Negative.   Skin: Positive for rash and wound. Negative for pallor.  Neurological: Negative.   Psychiatric/Behavioral: Positive for confusion. Negative for agitation, behavioral problems, decreased concentration, dysphoric mood, hallucinations, self-injury, sleep disturbance and suicidal ideas. The patient is not nervous/anxious and is not hyperactive.     Per HPI unless specifically  indicated above     Objective:    BP (!) 168/70 (BP Location: Left Arm, Patient Position: Sitting)   Pulse 66   Temp 98.3 F (36.8 C)   Wt 221 lb 12.8 oz (100.6 kg)   SpO2 98%   BMI 34.74 kg/m   Wt Readings from Last 3 Encounters:  01/05/21 221 lb 12.8 oz (100.6 kg)  11/29/19 219 lb 8 oz (99.6 kg)   08/30/19 207 lb 12.8 oz (94.3 kg)    Physical Exam Vitals and nursing note reviewed.  Constitutional:      General: She is not in acute distress.    Appearance: Normal appearance. She is not ill-appearing, toxic-appearing or diaphoretic.     Comments: Disheveled, smelling of cat urine  HENT:     Head: Normocephalic and atraumatic.     Comments: Swelling of eyelids bilaterally    Right Ear: External ear normal.     Left Ear: External ear normal.     Nose: Nose normal.     Mouth/Throat:     Mouth: Mucous membranes are moist.     Pharynx: Oropharynx is clear.  Eyes:     General: No scleral icterus.       Right eye: No discharge.        Left eye: No discharge.     Extraocular Movements: Extraocular movements intact.     Conjunctiva/sclera: Conjunctivae normal.     Pupils: Pupils are equal, round, and reactive to light.  Cardiovascular:     Rate and Rhythm: Normal rate and regular rhythm.     Pulses: Normal pulses.     Heart sounds: Normal heart sounds. No murmur heard. No friction rub. No gallop.   Pulmonary:     Effort: Pulmonary effort is normal. No respiratory distress.     Breath sounds: Normal breath sounds. No stridor. No wheezing, rhonchi or rales.  Chest:     Chest wall: No tenderness.  Musculoskeletal:        General: Normal range of motion.     Cervical back: Normal range of motion and neck supple.  Skin:    General: Skin is warm and dry.     Capillary Refill: Capillary refill takes less than 2 seconds.     Coloration: Skin is not jaundiced or pale.     Findings: No bruising, erythema, lesion or rash.     Comments: Excoriated rash on her arms and chest bilaterally Wound on lateral R maleolus and on L medial maleolus  Neurological:     General: No focal deficit present.     Mental Status: She is alert and oriented to person, place, and time. Mental status is at baseline.  Psychiatric:        Mood and Affect: Mood normal.        Behavior: Behavior normal.         Thought Content: Thought content normal.        Judgment: Judgment normal.     Results for orders placed or performed in visit on 01/05/21  Microscopic Examination   BLD  Result Value Ref Range   WBC, UA 0-5 0 - 5 /hpf   RBC None seen 0 - 2 /hpf   Epithelial Cells (non renal) None seen 0 - 10 /hpf   Bacteria, UA None seen None seen/Few  Bayer DCA Hb A1c Waived  Result Value Ref Range   HB A1C (BAYER DCA - WAIVED) 5.4 <7.0 %  Microalbumin, Urine Waived  Result Value  Ref Range   Microalb, Ur Waived 10 0 - 19 mg/L   Creatinine, Urine Waived 100 10 - 300 mg/dL   Microalb/Creat Ratio <30 <30 mg/g  Urinalysis, Routine w reflex microscopic  Result Value Ref Range   Specific Gravity, UA 1.020 1.005 - 1.030   pH, UA 5.5 5.0 - 7.5   Color, UA Yellow Yellow   Appearance Ur Clear Clear   Leukocytes,UA 1+ (A) Negative   Protein,UA Negative Negative/Trace   Glucose, UA Negative Negative   Ketones, UA Negative Negative   RBC, UA Trace (A) Negative   Bilirubin, UA Negative Negative   Urobilinogen, Ur 0.2 0.2 - 1.0 mg/dL   Nitrite, UA Negative Negative   Microscopic Examination See below:       Assessment & Plan:   Problem List Items Addressed This Visit      Cardiovascular and Mediastinum   HTN (hypertension)    Not doing well. Has been off her meds. Will restart 50mg  losartan and recheck 2 weeks. Call with any concerns.       Relevant Medications   losartan (COZAAR) 50 MG tablet   rivaroxaban (XARELTO) 20 MG TABS tablet   Other Relevant Orders   CBC with Differential/Platelet   Comprehensive metabolic panel   Microalbumin, Urine Waived (Completed)   Urinalysis, Routine w reflex microscopic (Completed)   AMB Referral to [facility]   Atrial fibrillation ([place]) - Primary    Stopped her xarelto due to cost and 1 episode of bleeding. Will restart and get her hooked up with pharmacy to help with cost. Referral generated today. Labs drawn today. Will get her back  into see cardiology. Call with any concerns.       Relevant Medications   losartan (COZAAR) 50 MG tablet   rivaroxaban (XARELTO) 20 MG TABS tablet   Other Relevant Orders   Ambulatory referral to Cardiology   CBC with Differential/Platelet   Comprehensive metabolic panel   AMB Referral to [facility]   Chronic venous insufficiency    Will get her back into vascular surgeon. Call with any concerns. Continue to monitor. Encouraged her to restart her compression hose.       Relevant Medications   losartan (COZAAR) 50 MG tablet   rivaroxaban (XARELTO) 20 MG TABS tablet     Musculoskeletal and Integument   Rash    Of unclear etiology. Will treat with triamcinalone shot today and prednisone tomorrow. Call with any concerns. Recheck 2 weeks.       Relevant Orders   CBC with Differential/Platelet   Comprehensive metabolic panel   AMB Referral to [facility]    Other Visit Diagnoses    Essential hypertension       Relevant Medications   losartan (COZAAR) 50 MG tablet   rivaroxaban (XARELTO) 20 MG TABS tablet   Memory loss       Will get her in with neurology. Checking labs today. Await results. Treat as needed.    Relevant Orders   Ambulatory referral to Neurology   CBC with Differential/Platelet   Comprehensive metabolic panel   Urinalysis, Routine w reflex microscopic (Completed)   [facility]   Hyperglycemia       Will check A1c. Await results.    Relevant Orders   Bayer DCA Hb A1c Waived (Completed)   Screening for cholesterol level       Labs dawn today. Await results.    Relevant Orders   Lipid Panel  w/o Chol/HDL Ratio   AMB Referral to [facility]   Peripheral edema       Relevant Orders   CBC with Differential/Platelet   Comprehensive metabolic panel   TSH   Ambulatory referral to Vascular Surgery   AMB Referral to [place]       Follow up plan: Return 1-2 weeks  follow up in person.

## 2021-01-05 NOTE — Chronic Care Management (AMB) (Signed)
  Chronic Care Management   Outreach Note  01/05/2021 Name: [name] MRN: [id number] DOB: 03-06-1941  [name] is a 80 y.o. year old female who is a primary care patient of [name], DO. I reached out to [name] by phone today in response to a referral sent by Ms. Nena A Lora's PCP, Wynetta Emery, Megan P, DO.    An unsuccessful telephone outreach was attempted today. The patient was referred to the case management team for assistance with care management and care coordination.   Follow Up Plan: The care management team will reach out to the patient again over the next 3-5 days.  If patient returns call to provider office, please advise to call [place] at [phone].  [place] Management

## 2021-01-05 NOTE — Assessment & Plan Note (Signed)
Stopped her xarelto due to cost and 1 episode of bleeding. Will restart and get her hooked up with pharmacy to help with cost. Referral generated today. Labs drawn today. Will get her back into see cardiology. Call with any concerns.

## 2021-01-06 LAB — CBC WITH DIFFERENTIAL/PLATELET
Basophils Absolute: 0.1 10*3/uL (ref 0.0–0.2)
Basos: 1 %
EOS (ABSOLUTE): 0.5 10*3/uL — ABNORMAL HIGH (ref 0.0–0.4)
Eos: 7 %
Hematocrit: 42.3 % (ref 34.0–46.6)
Hemoglobin: 13.8 g/dL (ref 11.1–15.9)
Immature Grans (Abs): 0 10*3/uL (ref 0.0–0.1)
Immature Granulocytes: 0 %
Lymphocytes Absolute: 1.7 10*3/uL (ref 0.7–3.1)
Lymphs: 22 %
MCH: 29.7 pg (ref 26.6–33.0)
MCHC: 32.6 g/dL (ref 31.5–35.7)
MCV: 91 fL (ref 79–97)
Monocytes Absolute: 0.6 10*3/uL (ref 0.1–0.9)
Monocytes: 8 %
Neutrophils Absolute: 4.9 10*3/uL (ref 1.4–7.0)
Neutrophils: 62 %
Platelets: 321 10*3/uL (ref 150–450)
RBC: 4.64 x10E6/uL (ref 3.77–5.28)
RDW: 13 % (ref 11.7–15.4)
WBC: 7.8 10*3/uL (ref 3.4–10.8)

## 2021-01-06 LAB — COMPREHENSIVE METABOLIC PANEL
ALT: 13 IU/L (ref 0–32)
AST: 19 IU/L (ref 0–40)
Albumin/Globulin Ratio: 1.7 (ref 1.2–2.2)
Albumin: 4.5 g/dL (ref 3.7–4.7)
Alkaline Phosphatase: 93 IU/L (ref 44–121)
BUN/Creatinine Ratio: 24 (ref 12–28)
BUN: 18 mg/dL (ref 8–27)
Bilirubin Total: 0.8 mg/dL (ref 0.0–1.2)
CO2: 20 mmol/L (ref 20–29)
Calcium: 9.5 mg/dL (ref 8.7–10.3)
Chloride: 106 mmol/L (ref 96–106)
Creatinine, Ser: 0.76 mg/dL (ref 0.57–1.00)
Globulin, Total: 2.6 g/dL (ref 1.5–4.5)
Glucose: 91 mg/dL (ref 65–99)
Potassium: 4.4 mmol/L (ref 3.5–5.2)
Sodium: 141 mmol/L (ref 134–144)
Total Protein: 7.1 g/dL (ref 6.0–8.5)
eGFR: 80 mL/min/{1.73_m2} (ref >59)

## 2021-01-06 LAB — LIPID PANEL W/O CHOL/HDL RATIO
Cholesterol, Total: 168 mg/dL (ref 100–199)
HDL: 72 mg/dL (ref >39)
LDL Chol Calc (NIH): 80 mg/dL (ref 0–99)
Triglycerides: 85 mg/dL (ref 0–149)
VLDL Cholesterol Cal: 16 mg/dL (ref 5–40)

## 2021-01-06 LAB — TSH: TSH: 0.726 u[IU]/mL (ref 0.450–4.500)

## 2021-01-10 NOTE — Chronic Care Management (AMB) (Signed)
  Chronic Care Management   Outreach Note  01/10/2021 Name: [name] MRN: [id number] DOB: 1941/01/04  [name] is a 80 y.o. year old female who is a primary care patient of [name], DO. I reached out to [place] by phone today in response to a referral sent by Ms. Aeryn A Rosasco's PCP, Dr Wynetta Emery      An unsuccessful telephone outreach was attempted today. The patient was referred to the case management team for assistance with care management and care coordination.   Follow Up Plan: A HIPAA compliant phone message was left for the patient providing contact information and requesting a return call. The care management team will reach out to the patient again over the next 7 days. If patient returns call to provider office, please advise to call [place] at [phone].  [place] Management

## 2021-01-12 ENCOUNTER — Ambulatory Visit: Payer: Medicare HMO | Admitting: Family Medicine

## 2021-01-12 ENCOUNTER — Other Ambulatory Visit: Payer: Self-pay | Admitting: Family Medicine

## 2021-01-12 NOTE — Telephone Encounter (Signed)
Requested medication (s) are due for refill today: na  Requested medication (s) are on the active medication list: yes cozaar  Last refill:  01/05/21 #30 4 refills  Future visit scheduled: no  Notes to clinic:  please specify quantity and refills . Product is on back ordere     Requested Prescriptions  Pending Prescriptions Disp Refills   valsartan (DIOVAN) 160 MG tablet [Pharmacy Med Name: VALSARTAN 160 MG TABLET]  0    Sig: Please specify directions, refills and quantity      Cardiovascular:  Angiotensin Receptor Blockers Failed - 01/12/2021  5:51 PM      Failed - Last BP in normal range    BP Readings from Last 1 Encounters:  01/05/21 (!) 168/70          Passed - Cr in normal range and within 180 days    Creatinine  Date Value Ref Range Status  11/16/2012 0.55 (L) 0.60 - 1.30 mg/dL Final   Creatinine, Ser  Date Value Ref Range Status  01/05/2021 0.76 0.57 - 1.00 mg/dL Final          Passed - K in normal range and within 180 days    Potassium  Date Value Ref Range Status  01/05/2021 4.4 3.5 - 5.2 mmol/L Final  11/16/2012 3.5 3.5 - 5.1 mmol/L Final          Passed - Patient is not pregnant      Passed - Valid encounter within last 6 months    Recent Outpatient Visits           1 week ago Permanent atrial fibrillation ([place])   [place], Megan P, DO   1 year ago Routine general medical examination at a health care facility   [place], [place], DO   1 year ago Essential hypertension   [place], Van P, DO   2 years ago Routine general medical examination at a health care facility   [place], [place], DO   2 years ago Essential hypertension   [place], [place], DO

## 2021-01-15 ENCOUNTER — Other Ambulatory Visit: Payer: Self-pay | Admitting: Nurse Practitioner

## 2021-01-15 ENCOUNTER — Telehealth: Payer: Self-pay

## 2021-01-15 MED ORDER — VALSARTAN 80 MG PO TABS: 80.0000 mg | ORAL_TABLET | Freq: Every day | ORAL | 4 refills | Status: DC

## 2021-01-15 NOTE — Telephone Encounter (Signed)
Patient requesting refill, but pharmacy would like verification of quantity and refills. Also, they informed us the medication is on backorder. Please advise?

## 2021-01-15 NOTE — Telephone Encounter (Signed)
Not showing on dpr  Copied from [place] [phone]. Topic: General - Other >> Jan 15, 2021  9:24 AM Keene Breath wrote: Reason for CRM: Patient's relative ([name] Tisdell) called to speak with Tiffany regarding a referral  for mother to [facility].  She stated she had some questions before she schedules that appt.  Please advise and call to discuss at [phone]

## 2021-01-15 NOTE — Telephone Encounter (Signed)
Questions answered.

## 2021-01-18 NOTE — Chronic Care Management (AMB) (Signed)
  Chronic Care Management   Outreach Note  01/18/2021 Name: [name] MRN: [id number] DOB: Jan 08, 1941  [name] [name] is a 80 y.o. year old female who is a primary care patient of [name], DO. I reached out to [place] by phone today in response to a referral sent by [name]'s PCP, Dr. Wynetta Emery     Third unsuccessful telephone outreach was attempted today. The patient was referred to the case management team for assistance with care management and care coordination. The patient's primary care provider has been notified of our unsuccessful attempts to make or maintain contact with the patient. The care management team is pleased to engage with this patient at any time in the future should he/she be interested in assistance from the care management team.   Follow Up Plan: We have been unable to make contact with the patient for follow up. The care management team is available to follow up with the patient after provider conversation with the patient regarding recommendation for care management engagement and subsequent re-referral to the care management team.   [place] Management

## 2021-03-13 ENCOUNTER — Encounter: Payer: Self-pay | Admitting: Cardiovascular Disease

## 2021-03-20 ENCOUNTER — Ambulatory Visit: Payer: Medicare HMO | Admitting: Diagnostic Neuroimaging

## 2021-03-29 ENCOUNTER — Ambulatory Visit (INDEPENDENT_AMBULATORY_CARE_PROVIDER_SITE_OTHER): Payer: Medicare HMO | Admitting: Family Medicine

## 2021-03-29 ENCOUNTER — Emergency Department: Payer: Medicare HMO

## 2021-03-29 ENCOUNTER — Inpatient Hospital Stay
Admission: EM | Admit: 2021-03-29 | Discharge: 2021-04-13 | DRG: 064 | Disposition: A | Discharge status: Skilled Nursing Facility | Payer: Medicare HMO | Attending: Family Medicine | Admitting: Family Medicine

## 2021-03-29 ENCOUNTER — Other Ambulatory Visit: Payer: Self-pay

## 2021-03-29 ENCOUNTER — Encounter: Payer: Self-pay | Admitting: Family Medicine

## 2021-03-29 VITALS — BP 131/71 | HR 69 | Temp 98.2°F | Wt 223.2 lb

## 2021-03-29 DIAGNOSIS — I872 Venous insufficiency (chronic) (peripheral): Secondary | ICD-10-CM | POA: Diagnosis present

## 2021-03-29 DIAGNOSIS — L03119 Cellulitis of unspecified part of limb: Secondary | ICD-10-CM

## 2021-03-29 DIAGNOSIS — I4891 Unspecified atrial fibrillation: Secondary | ICD-10-CM | POA: Diagnosis present

## 2021-03-29 DIAGNOSIS — L03116 Cellulitis of left lower limb: Secondary | ICD-10-CM | POA: Diagnosis not present

## 2021-03-29 DIAGNOSIS — Z7901 Long term (current) use of anticoagulants: Secondary | ICD-10-CM

## 2021-03-29 DIAGNOSIS — L0390 Cellulitis, unspecified: Secondary | ICD-10-CM | POA: Diagnosis present

## 2021-03-29 DIAGNOSIS — Z9114 Patient's other noncompliance with medication regimen: Secondary | ICD-10-CM

## 2021-03-29 DIAGNOSIS — I10 Essential (primary) hypertension: Secondary | ICD-10-CM

## 2021-03-29 DIAGNOSIS — I4821 Permanent atrial fibrillation: Secondary | ICD-10-CM

## 2021-03-29 DIAGNOSIS — R413 Other amnesia: Secondary | ICD-10-CM | POA: Diagnosis not present

## 2021-03-29 DIAGNOSIS — Z87891 Personal history of nicotine dependence: Secondary | ICD-10-CM

## 2021-03-29 DIAGNOSIS — F05 Delirium due to known physiological condition: Secondary | ICD-10-CM | POA: Diagnosis not present

## 2021-03-29 DIAGNOSIS — Z6831 Body mass index (BMI) 31.0-31.9, adult: Secondary | ICD-10-CM

## 2021-03-29 DIAGNOSIS — R4182 Altered mental status, unspecified: Secondary | ICD-10-CM | POA: Diagnosis not present

## 2021-03-29 DIAGNOSIS — L03115 Cellulitis of right lower limb: Secondary | ICD-10-CM | POA: Diagnosis not present

## 2021-03-29 DIAGNOSIS — E876 Hypokalemia: Secondary | ICD-10-CM | POA: Diagnosis present

## 2021-03-29 DIAGNOSIS — G9341 Metabolic encephalopathy: Secondary | ICD-10-CM | POA: Diagnosis present

## 2021-03-29 DIAGNOSIS — I6349 Cerebral infarction due to embolism of other cerebral artery: Principal | ICD-10-CM | POA: Diagnosis present

## 2021-03-29 DIAGNOSIS — R410 Disorientation, unspecified: Secondary | ICD-10-CM | POA: Insufficient documentation

## 2021-03-29 DIAGNOSIS — T43595A Adverse effect of other antipsychotics and neuroleptics, initial encounter: Secondary | ICD-10-CM | POA: Diagnosis not present

## 2021-03-29 DIAGNOSIS — I878 Other specified disorders of veins: Secondary | ICD-10-CM | POA: Diagnosis present

## 2021-03-29 DIAGNOSIS — N390 Urinary tract infection, site not specified: Secondary | ICD-10-CM | POA: Diagnosis present

## 2021-03-29 DIAGNOSIS — Z20822 Contact with and (suspected) exposure to covid-19: Secondary | ICD-10-CM | POA: Diagnosis present

## 2021-03-29 DIAGNOSIS — E669 Obesity, unspecified: Secondary | ICD-10-CM | POA: Diagnosis present

## 2021-03-29 DIAGNOSIS — I6930 Unspecified sequelae of cerebral infarction: Secondary | ICD-10-CM | POA: Diagnosis present

## 2021-03-29 DIAGNOSIS — G210 Malignant neuroleptic syndrome: Secondary | ICD-10-CM | POA: Diagnosis not present

## 2021-03-29 DIAGNOSIS — F0390 Unspecified dementia without behavioral disturbance: Secondary | ICD-10-CM | POA: Diagnosis present

## 2021-03-29 DIAGNOSIS — M6282 Rhabdomyolysis: Secondary | ICD-10-CM | POA: Diagnosis present

## 2021-03-29 DIAGNOSIS — B965 Pseudomonas (aeruginosa) (mallei) (pseudomallei) as the cause of diseases classified elsewhere: Secondary | ICD-10-CM | POA: Diagnosis present

## 2021-03-29 DIAGNOSIS — R509 Fever, unspecified: Secondary | ICD-10-CM

## 2021-03-29 DIAGNOSIS — I639 Cerebral infarction, unspecified: Secondary | ICD-10-CM

## 2021-03-29 LAB — CBC
HCT: 41.1 % (ref 36.0–46.0)
Hemoglobin: 13.7 g/dL (ref 12.0–15.0)
MCH: 30 pg (ref 26.0–34.0)
MCHC: 33.3 g/dL (ref 30.0–36.0)
MCV: 90.1 fL (ref 80.0–100.0)
Platelets: 348 10*3/uL (ref 150–400)
RBC: 4.56 MIL/uL (ref 3.87–5.11)
RDW: 13.2 % (ref 11.5–15.5)
WBC: 7.9 10*3/uL (ref 4.0–10.5)
nRBC: 0 % (ref 0.0–0.2)

## 2021-03-29 LAB — COMPREHENSIVE METABOLIC PANEL
ALT: 14 U/L (ref 0–44)
AST: 30 U/L (ref 15–41)
Albumin: 4 g/dL (ref 3.5–5.0)
Alkaline Phosphatase: 72 U/L (ref 38–126)
Anion gap: 12 (ref 5–15)
BUN: 19 mg/dL (ref 8–23)
CO2: 22 mmol/L (ref 22–32)
Calcium: 9.2 mg/dL (ref 8.9–10.3)
Chloride: 108 mmol/L (ref 98–111)
Creatinine, Ser: 0.8 mg/dL (ref 0.44–1.00)
GFR, Estimated: >60 mL/min (ref >60)
Glucose, Bld: 92 mg/dL (ref 70–99)
Potassium: 4.6 mmol/L (ref 3.5–5.1)
Sodium: 142 mmol/L (ref 135–145)
Total Bilirubin: 1.2 mg/dL (ref 0.3–1.2)
Total Protein: 7.5 g/dL (ref 6.5–8.1)

## 2021-03-29 LAB — BLOOD GAS, ARTERIAL
Acid-Base Excess: 1.9 mmol/L (ref 0.0–2.0)
Bicarbonate: 25.6 mmol/L (ref 20.0–28.0)
FIO2: 0.21
O2 Saturation: 96.8 %
Patient temperature: 37
pCO2 arterial: 36 mmHg (ref 32.0–48.0)
pH, Arterial: 7.46 — ABNORMAL HIGH (ref 7.350–7.450)
pO2, Arterial: 84 mmHg (ref 83.0–108.0)

## 2021-03-29 LAB — TSH: TSH: 1.027 u[IU]/mL (ref 0.350–4.500)

## 2021-03-29 LAB — DIFFERENTIAL
Abs Immature Granulocytes: 0.03 10*3/uL (ref 0.00–0.07)
Basophils Absolute: 0.1 10*3/uL (ref 0.0–0.1)
Basophils Relative: 1 %
Eosinophils Absolute: 0.1 10*3/uL (ref 0.0–0.5)
Eosinophils Relative: 1 %
Immature Granulocytes: 0 %
Lymphocytes Relative: 19 %
Lymphs Abs: 1.5 10*3/uL (ref 0.7–4.0)
Monocytes Absolute: 0.6 10*3/uL (ref 0.1–1.0)
Monocytes Relative: 8 %
Neutro Abs: 5.6 10*3/uL (ref 1.7–7.7)
Neutrophils Relative %: 71 %

## 2021-03-29 LAB — PROTIME-INR
INR: 1 (ref 0.8–1.2)
Prothrombin Time: 13.1 seconds (ref 11.4–15.2)

## 2021-03-29 LAB — RESP PANEL BY RT-PCR (FLU A&B, COVID) ARPGX2
Influenza A by PCR: NEGATIVE
Influenza B by PCR: NEGATIVE
SARS Coronavirus 2 by RT PCR: NEGATIVE

## 2021-03-29 LAB — CBG MONITORING, ED: Glucose-Capillary: 80 mg/dL (ref 70–99)

## 2021-03-29 LAB — APTT: aPTT: 24 seconds (ref 24–36)

## 2021-03-29 LAB — AMMONIA: Ammonia: 14 umol/L (ref 9–35)

## 2021-03-29 IMAGING — CT CT HEAD W/O CM
3 series · 16 of 47 positions shown, 19 images · non-contrast
Comparison: None.

CLINICAL DATA: Altered mental status.

EXAM:
CT HEAD WITHOUT CONTRAST
TECHNIQUE: Contiguous axial images were obtained from the base of the skull
through the vertex without intravenous contrast.

[Series 2: head wo · axial · 0.43mm/px · z∈[+218,+358]mm · 10 of 34 slices shown, 13 images]
[im 3/34  brain]
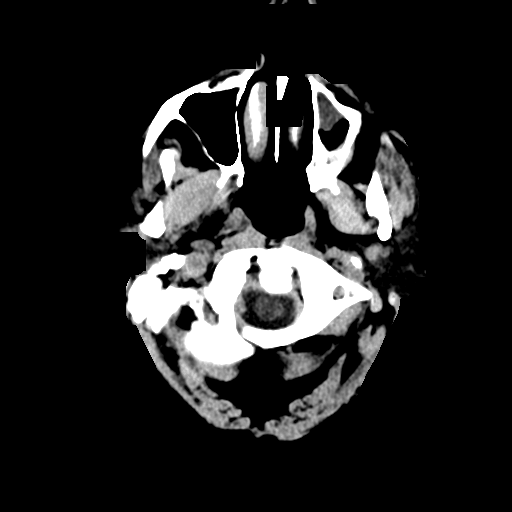
[im 3/34  bone]
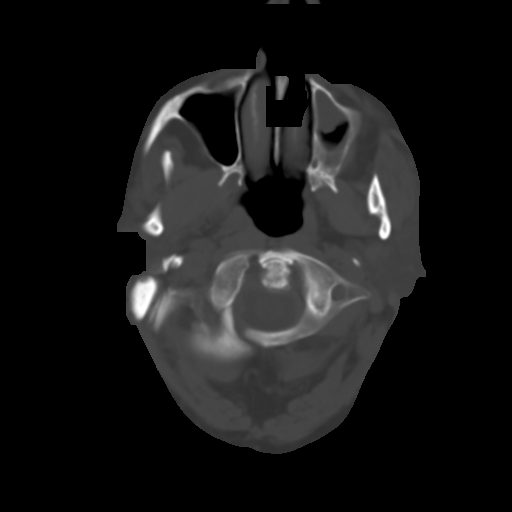
[im 6/34  brain]
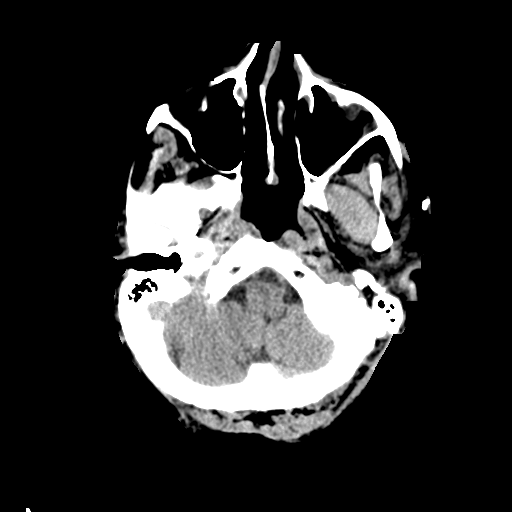
[im 10/34  brain]
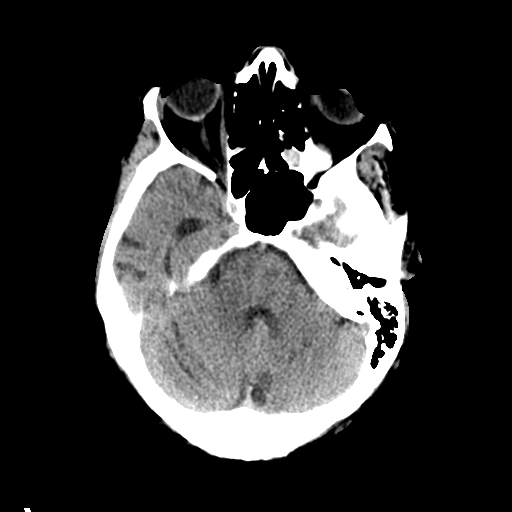
[im 12/34  brain]
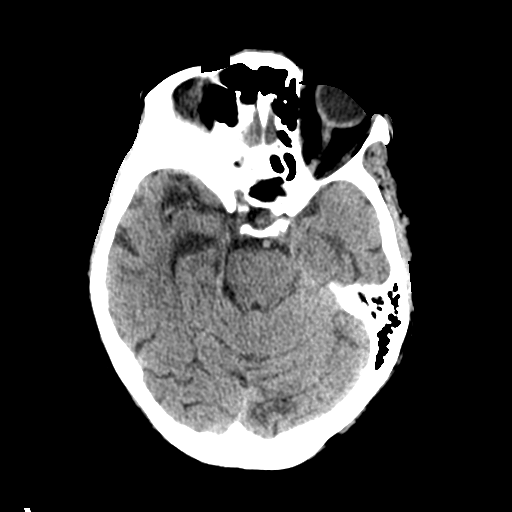
[im 15/34  brain]
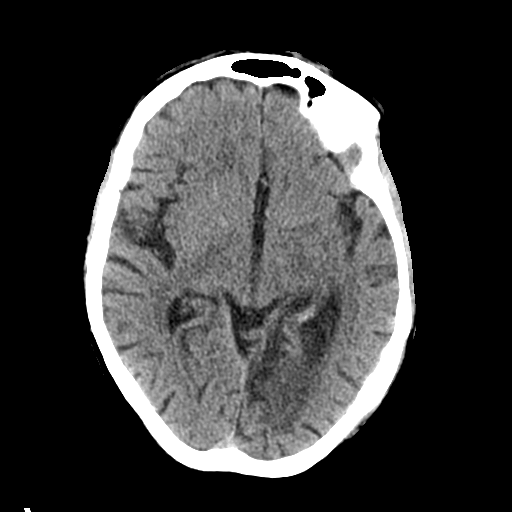
[im 15/34  bone]
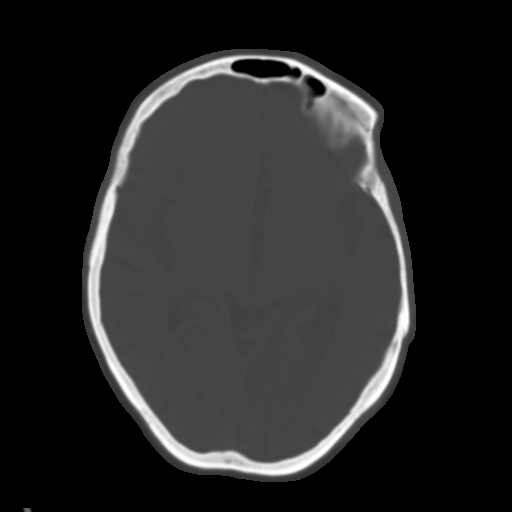
[im 19/34  brain]
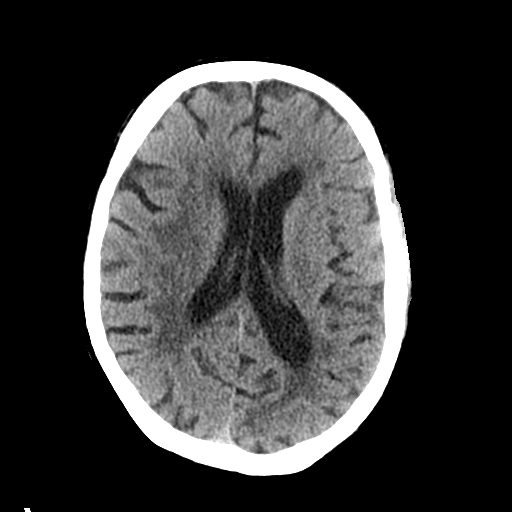
[im 22/34  brain]
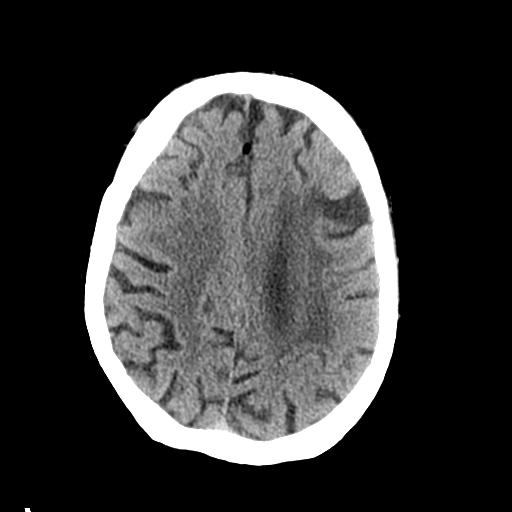
[im 26/34  brain]
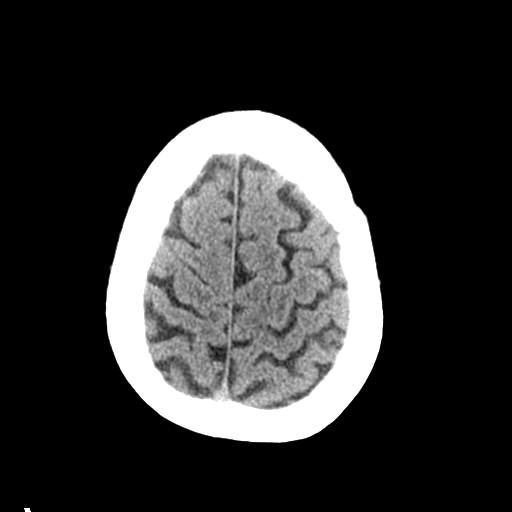
[im 28/34  brain]
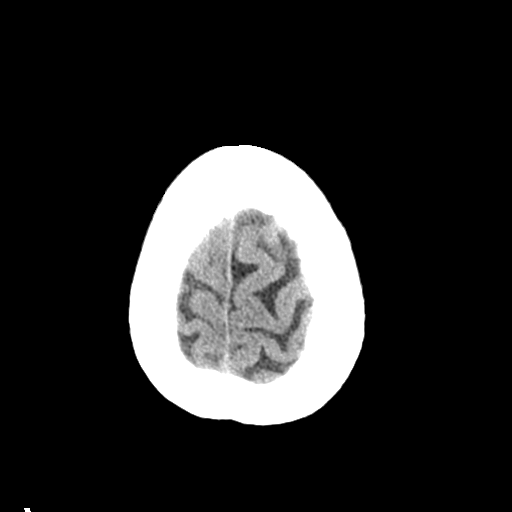
[im 28/34  bone]
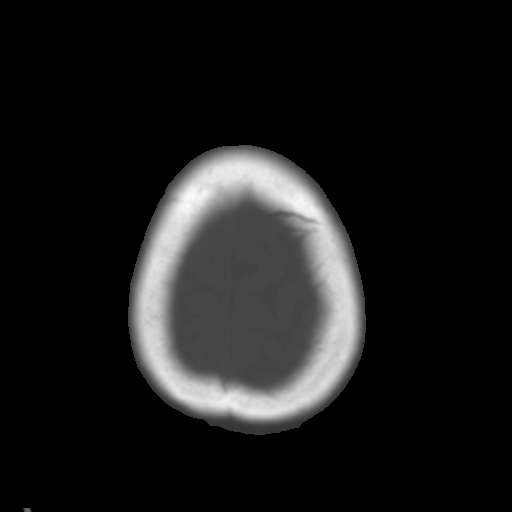
[im 31/34  brain]
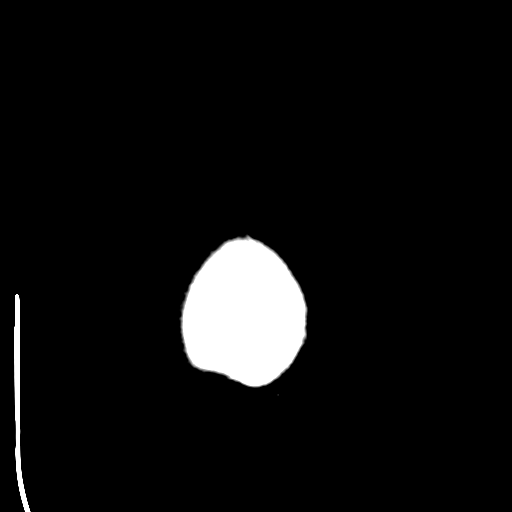

[Series 4: coronal soft tissue · coronal · 0.33mm/px · 3 of 76 slices shown]
[im 26/76  brain]
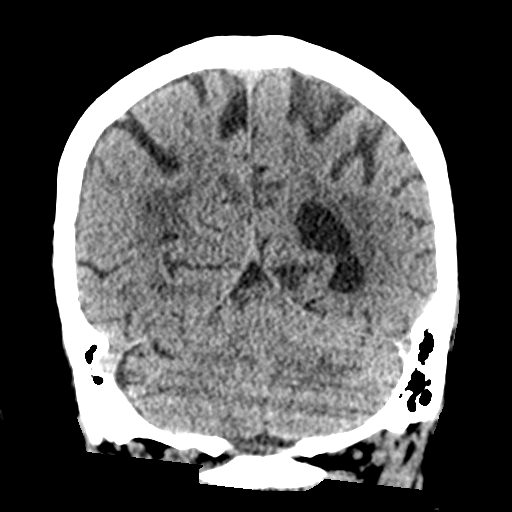
[im 34/76  brain]
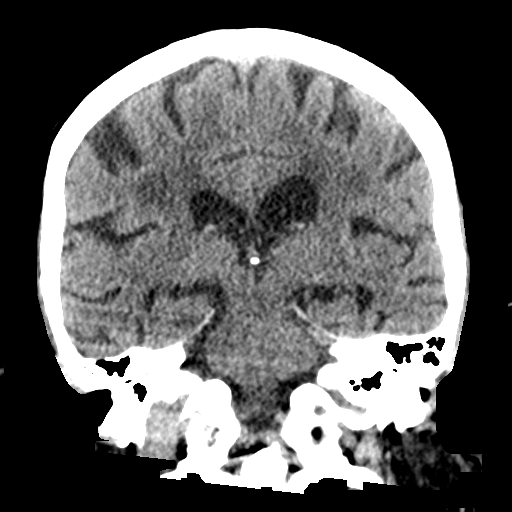
[im 42/76  brain]
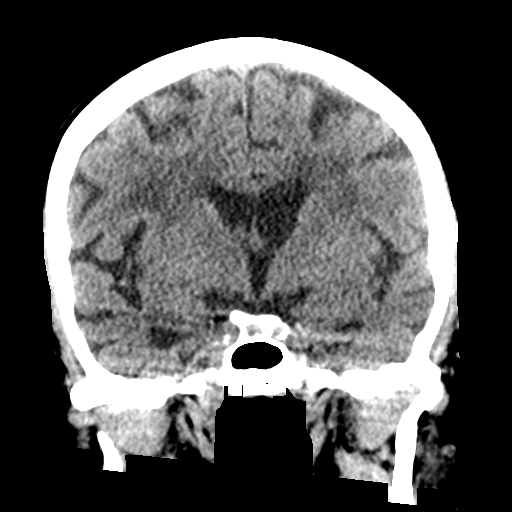

[Series 5: sagittal soft tissue · sagittal · 0.35mm/px · 3 of 58 slices shown]
[im 23/58  brain]
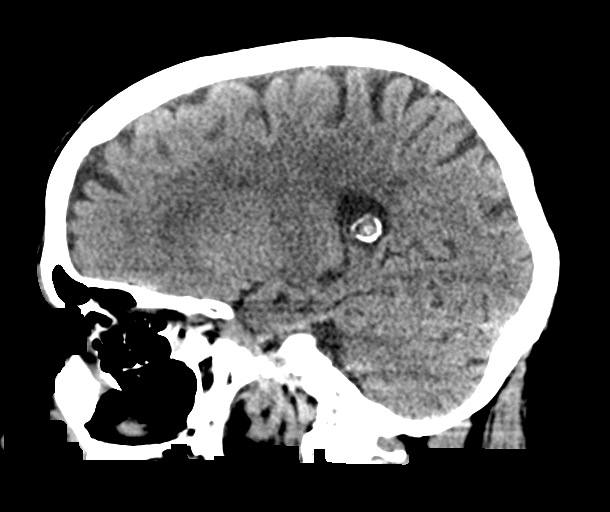
[im 29/58  brain]
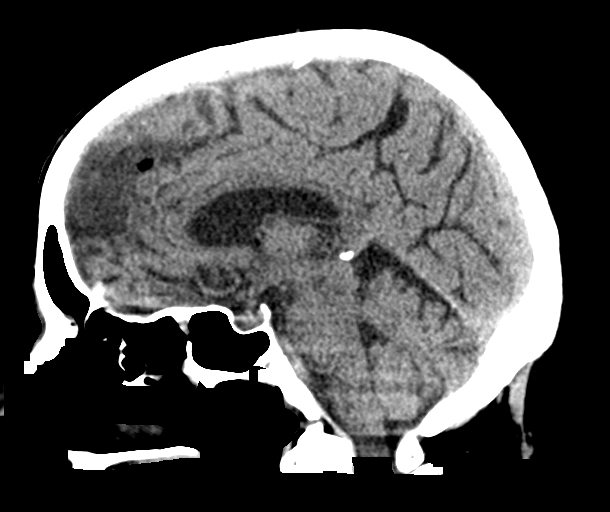
[im 35/58  brain]
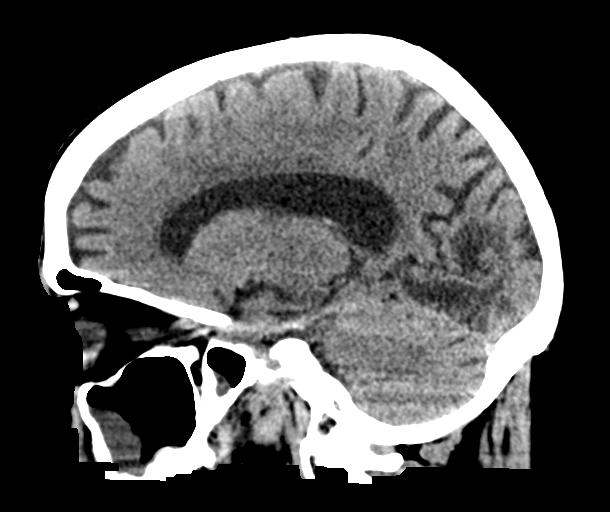

[16 of 47 positions shown; findings below may reference images not displayed]

FINDINGS: Brain: Old left frontal and occipital infarctions are noted. No mass
effect or midline shift is noted. Ventricular size is within normal
limits. There is no evidence of mass lesion, hemorrhage or acute
infarction.

Vascular: No hyperdense vessel or unexpected calcification.

Skull: Normal. Negative for fracture or focal lesion.

Sinuses/Orbits: No acute finding.

Other: None.
IMPRESSION: No acute intracranial abnormality seen.

## 2021-03-29 IMAGING — DX DG CHEST 1V PORT
1 series · 1 of 1 positions shown · non-contrast
Comparison: None.

CLINICAL DATA: Altered mental status

EXAM:
PORTABLE CHEST 1 VIEW

[chest ap]
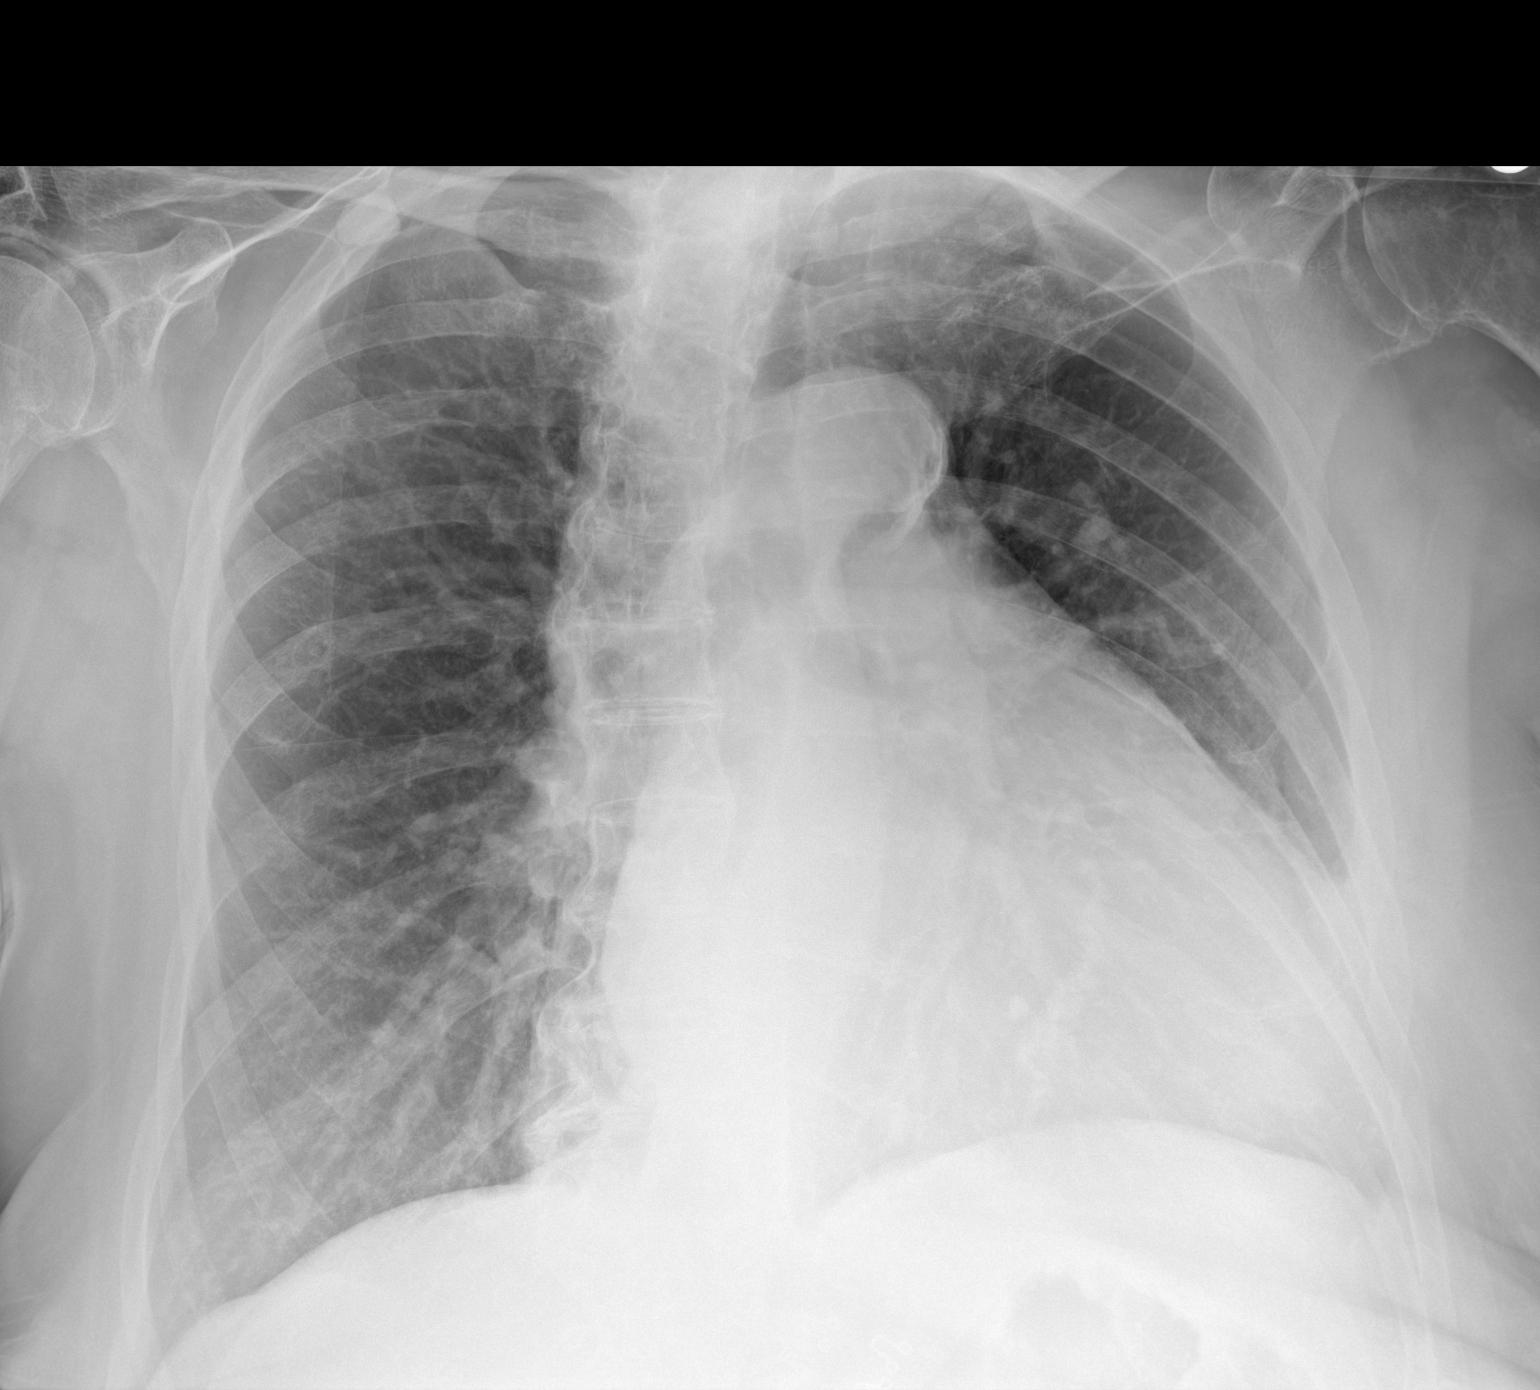

[1 of 1 positions shown; findings below may reference images not displayed]

FINDINGS: Cardiac shadow is enlarged. Aortic calcifications are noted. Lungs
are well aerated bilaterally without focal infiltrate. Mild vascular
prominence is noted without edema. No bony abnormality is seen.
IMPRESSION: No acute abnormality noted.

## 2021-03-29 MED ORDER — HALOPERIDOL LACTATE 5 MG/ML IJ SOLN
2.5000 mg | Freq: Once | INTRAMUSCULAR | Status: AC
  Administered 2021-03-29: 2.5 mg via INTRAMUSCULAR

## 2021-03-29 MED ORDER — LORAZEPAM 2 MG/ML IJ SOLN
1.0000 mg | Freq: Four times a day (QID) | INTRAMUSCULAR | Status: DC | PRN
  Administered 2021-03-30 – 2021-03-31 (×3): 1 mg via INTRAVENOUS
  Filled 2021-03-29 (×3): qty 1

## 2021-03-29 MED ORDER — ONDANSETRON HCL 4 MG PO TABS: 4.0000 mg | ORAL_TABLET | Freq: Four times a day (QID) | ORAL | Status: DC | PRN

## 2021-03-29 MED ORDER — ACETAMINOPHEN 325 MG PO TABS
650.0000 mg | ORAL_TABLET | Freq: Four times a day (QID) | ORAL | Status: DC | PRN
  Administered 2021-03-31 – 2021-04-04 (×4): 650 mg via ORAL
  Filled 2021-03-29 (×4): qty 2

## 2021-03-29 MED ORDER — SODIUM CHLORIDE 0.9% FLUSH
3.0000 mL | Freq: Once | INTRAVENOUS | Status: DC
Start: 2021-03-29 — End: 2021-04-13

## 2021-03-29 MED ORDER — LACTATED RINGERS IV SOLN: INTRAVENOUS | Status: DC

## 2021-03-29 MED ORDER — LORAZEPAM 2 MG/ML IJ SOLN: 1.0000 mg | Freq: Once | INTRAMUSCULAR | Status: AC

## 2021-03-29 MED ORDER — RIVAROXABAN 20 MG PO TABS
20.0000 mg | ORAL_TABLET | Freq: Every day | ORAL | Status: DC
  Administered 2021-03-30 – 2021-04-13 (×15): 20 mg via ORAL
  Filled 2021-03-29 (×16): qty 1

## 2021-03-29 MED ORDER — ONDANSETRON HCL 4 MG/2ML IJ SOLN: 4.0000 mg | Freq: Four times a day (QID) | INTRAMUSCULAR | Status: DC | PRN

## 2021-03-29 MED ORDER — CEFAZOLIN SODIUM-DEXTROSE 2-4 GM/100ML-% IV SOLN
2.0000 g | Freq: Three times a day (TID) | INTRAVENOUS | Status: DC
  Administered 2021-03-29 – 2021-04-03 (×14): 2 g via INTRAVENOUS
  Filled 2021-03-29 (×19): qty 100

## 2021-03-29 MED ORDER — METOPROLOL SUCCINATE ER 50 MG PO TB24
50.0000 mg | ORAL_TABLET | Freq: Every day | ORAL | Status: DC
  Administered 2021-03-31 – 2021-04-11 (×12): 50 mg via ORAL
  Filled 2021-03-29 (×12): qty 1

## 2021-03-29 MED ORDER — METOPROLOL SUCCINATE ER 50 MG PO TB24: 50.0000 mg | ORAL_TABLET | Freq: Every day | ORAL | 6 refills | Status: DC

## 2021-03-29 MED ORDER — ACETAMINOPHEN 650 MG RE SUPP: 650.0000 mg | Freq: Four times a day (QID) | RECTAL | Status: DC | PRN

## 2021-03-29 MED ORDER — HALOPERIDOL LACTATE 5 MG/ML IJ SOLN
2.5000 mg | Freq: Once | INTRAMUSCULAR | Status: AC
  Administered 2021-03-29: 2.5 mg via INTRAMUSCULAR
  Filled 2021-03-29: qty 1

## 2021-03-29 MED ORDER — SULFAMETHOXAZOLE-TRIMETHOPRIM 800-160 MG PO TABS: 1.0000 | ORAL_TABLET | Freq: Two times a day (BID) | ORAL | 0 refills | Status: DC

## 2021-03-29 MED ORDER — LORAZEPAM 2 MG/ML IJ SOLN
INTRAMUSCULAR | Status: AC
  Administered 2021-03-29: 1 mg via INTRAMUSCULAR
  Filled 2021-03-29: qty 1

## 2021-03-29 NOTE — Assessment & Plan Note (Signed)
Under good control on current regimen. Has been taking old metoprolol and not her valsartan. Will continue metoprolol for now. Continue to monitor.

## 2021-03-29 NOTE — Assessment & Plan Note (Signed)
Not under good control. Was not able to get in with Dr. Moody Bruins. Will get appointment scheduled for them. Encouraged compression socks. Continue to monitor.

## 2021-03-29 NOTE — H&P (Signed)
History and Physical    [name] GMW:[id number] DOB: 01-18-1941 DOA: 03/29/2021  PCP: [name], DO  Patient coming from: Home  I have personally briefly reviewed patient's old medical records in [place]  Chief Complaint: Change in mental status  HPI: [name] is a 80 y.o. female with medical history significant of atrial fibrillation, chronic venous stasis, hypertension, chronic memory issues, is brought to the hospital with change in mental status.  Patient was at her primary care physician today for follow-up and it was noted that she had a change in her normal mental status.  Patient was increasingly confused which was not near her baseline.  Family is not available at the bedside for further information.  I attempted to call her daughter-in-law, but she was unable to add any significant history since she does not live with the patient.  Per record, no recent fever.  No new medications recently.  Work-up in the emergency room including CBC, chemistry were noted to be unrevealing.  CT head also unrevealing.  Due to her persistent change in mental status, she has been referred for admission.  Review of Systems: Unable to assess due to mental status.    Past Medical History:  Diagnosis Date  . Cat bite of right lower leg [id number]  . Cellulitis of lower leg 12/07/2014  . Hypertension     No past surgical history on file.  Social History:  reports that she quit smoking about 42 years ago. She has never used smokeless tobacco. She reports that she does not drink alcohol and does not use drugs.  Allergies  Allergen Reactions  . Amlodipine     Leg swelling  . Hctz [Hydrochlorothiazide] Nausea Only and Rash    Family History  Problem Relation Age of Onset  . Hypertension Mother   . Alcohol abuse Father   . Heart disease Father   . Hypertension Father   . Hyperlipidemia Sister   . Hypertension Sister   . Alcohol abuse Brother   . Hypertension Brother       Prior to Admission medications   Medication Sig Start Date End Date Taking? Authorizing Provider  metoprolol succinate (TOPROL-XL) 50 MG 24 hr tablet Take 1 tablet (50 mg total) by mouth daily. Take with or immediately following a meal. 03/29/21   Johnson, Megan P, DO  rivaroxaban (XARELTO) 20 MG TABS tablet TAKE 1 TABLET BY MOUTH EVERY DAY WITH DINNER 01/05/21   Johnson, Megan P, DO  sulfamethoxazole-trimethoprim (BACTRIM DS) 800-160 MG tablet Take 1 tablet by mouth 2 (two) times daily. 03/29/21   [name], DO    Physical Exam: Vitals:   03/29/21 1200 03/29/21 1320 03/29/21 1400 03/29/21 1545  BP: 137/80 (!) 158/77 140/62 (!) 177/73  Pulse: 62 64 65 72  Resp: 20 20 20 20   Temp:      TempSrc:      SpO2: 98% 98% 98% 98%  Weight:      Height:        Constitutional: NAD, calm, comfortable Eyes: PERRL, lids and conjunctivae normal ENMT: Mucous membranes are moist. Posterior pharynx clear of any exudate or lesions.Normal dentition.  Neck: normal, supple, no masses, no thyromegaly Respiratory: clear to auscultation bilaterally, no wheezing, no crackles. Normal respiratory effort. No accessory muscle use.  Cardiovascular: Irregular rate and rhythm, no murmurs / rubs / gallops. No extremity edema. 2+ pedal pulses. No carotid bruits.  Abdomen: no tenderness, no masses palpated. No hepatosplenomegaly. Bowel sounds  positive.  Musculoskeletal: no clubbing / cyanosis. No joint deformity upper and lower extremities. Good ROM, no contractures. Normal muscle tone.  Skin: no rashes, lesions, ulcers. No induration Neurologic: CN 2-12 grossly intact. Sensation intact, DTR normal. Strength 5/5 in all 4.  Psychiatric: Confused, pleasant, does not know that she is in the hospital   Labs on Admission: I have personally reviewed following labs and imaging studies  CBC: Recent Labs  Lab 03/29/21 1159  WBC 7.9  NEUTROABS 5.6  HGB 13.7  HCT 41.1  MCV 90.1  PLT 505   Basic Metabolic  Panel: Recent Labs  Lab 03/29/21 1159  NA 142  K 4.6  CL 108  CO2 22  GLUCOSE 92  BUN 19  CREATININE 0.80  CALCIUM 9.2   GFR: Estimated Creatinine Clearance: 73.4 mL/min (by C-G formula based on SCr of 0.8 mg/dL). Liver Function Tests: Recent Labs  Lab 03/29/21 1159  AST 30  ALT 14  ALKPHOS 72  BILITOT 1.2  PROT 7.5  ALBUMIN 4.0   No results for input(s): LIPASE, AMYLASE in the last 168 hours. No results for input(s): AMMONIA in the last 168 hours. Coagulation Profile: Recent Labs  Lab 03/29/21 1159  INR 1.0   Cardiac Enzymes: No results for input(s): CKTOTAL, CKMB, CKMBINDEX, TROPONINI in the last 168 hours. BNP (last 3 results) No results for input(s): PROBNP in the last 8760 hours. HbA1C: No results for input(s): HGBA1C in the last 72 hours. CBG: Recent Labs  Lab 03/29/21 1119  GLUCAP 80   Lipid Profile: No results for input(s): CHOL, HDL, LDLCALC, TRIG, CHOLHDL, LDLDIRECT in the last 72 hours. Thyroid Function Tests: No results for input(s): TSH, T4TOTAL, FREET4, T3FREE, THYROIDAB in the last 72 hours. Anemia Panel: No results for input(s): VITAMINB12, FOLATE, FERRITIN, TIBC, IRON, RETICCTPCT in the last 72 hours. Urine analysis:    Component Value Date/Time   APPEARANCEUR Clear 01/05/2021 1055   GLUCOSEU Negative 01/05/2021 1055   BILIRUBINUR Negative 01/05/2021 1055   PROTEINUR Negative 01/05/2021 1055   NITRITE Negative 01/05/2021 1055   LEUKOCYTESUR 1+ (A) 01/05/2021 1055    Radiological Exams on Admission: CT HEAD WO CONTRAST  Result Date: 03/29/2021 CLINICAL DATA:  Altered mental status. EXAM: CT HEAD WITHOUT CONTRAST TECHNIQUE: Contiguous axial images were obtained from the base of the skull through the vertex without intravenous contrast. COMPARISON:  None. FINDINGS: Brain: Old left frontal and occipital infarctions are noted. No mass effect or midline shift is noted. Ventricular size is within normal limits. There is no evidence of mass  lesion, hemorrhage or acute infarction. Vascular: No hyperdense vessel or unexpected calcification. Skull: Normal. Negative for fracture or focal lesion. Sinuses/Orbits: No acute finding. Other: None. IMPRESSION: No acute intracranial abnormality seen. Electronically Signed   By: Marijo Conception M.D.   On: 03/29/2021 12:01   DG Chest Portable 1 View  Result Date: 03/29/2021 CLINICAL DATA:  Altered mental status EXAM: PORTABLE CHEST 1 VIEW COMPARISON:  None. FINDINGS: Cardiac shadow is enlarged. Aortic calcifications are noted. Lungs are well aerated bilaterally without focal infiltrate. Mild vascular prominence is noted without edema. No bony abnormality is seen. IMPRESSION: No acute abnormality noted. Electronically Signed   By: Inez Catalina M.D.   On: 03/29/2021 12:50    EKG: Independently reviewed. Atrial fibrillation  Assessment/Plan Active Problems:   HTN (hypertension)   Atrial fibrillation (HCC)   Chronic venous insufficiency   Delirium   Cellulitis     1.  Delirium.  Patient may  have some underlying cognitive impairments since it is noted in her primary care physician records that she does have memory issues and is scheduled to see neurology on 6/8.  Her daughter-in-law also reports that there have been some concerns for developing dementia.  Her current mental state appears to be an acute change on her baseline symptoms.  Urinalysis has been ordered but has not been collected as of yet.  CT head was unrevealing, MRI has been also ordered.  We will also order B12, TSH, ammonia, ABG.  If work-up is unrevealing, this may be progression of her underlying dementia.  2.  Chronic venous stasis bilaterally with possible underlying cellulitis.  She does have erythema in her lower extremities bilaterally with swelling.  We will start on Ancef for now with goal to transition to Augmentin to complete course.  3.  Atrial fibrillation.  She is chronically on metoprolol.  Heart rate is stable.   Continue anticoagulation with Xarelto.  No evidence of bleeding on CT head.  4.  Hypertension.  Blood pressure currently stable on metoprolol.  DVT prophylaxis: xarelto Code Status: full code  Family Communication: discussed with daughter in law [name] Cutler Disposition Plan: Patient is from home.  Physical therapy evaluation requested. Consults called:   Admission status: Observation, progressive cardiac  Kathie Dike MD [facility]   If 7PM-7AM, please contact night-coverage [email or web address]   03/29/2021, 7:38 PM

## 2021-03-29 NOTE — Assessment & Plan Note (Signed)
Has not gotten back in with cardiology. Will get her set back up to see them. Continue xarelto. Call with any concerns.

## 2021-03-29 NOTE — ED Notes (Signed)
Pt to CT at this time.

## 2021-03-29 NOTE — ED Notes (Addendum)
Pt up OOB at this time with son in law at bedside. Pt redirected back into bed at this time, tolerated well.

## 2021-03-29 NOTE — ED Provider Notes (Signed)
80 year old female here with acute altered mental status.  Patient is disoriented, significantly above her baseline.  No apparent focal deficits.  CT head negative.  Screening lab work today is unremarkable.  Patient remains significantly altered and combative.  Unclear whether this is polypharmacy versus UTI, versus metabolic encephalopathy from other source.  Patient requiring Haldol and Ativan in the ED despite multiple attempts at verbal redirection.  Patient may have very minimal dementia per report, but is normally awake and alert.  This is a significant change from her baseline.  Will plan to admit for behavioral management, further medical work-up, including MRI.   Lisa Bruce, MD 03/29/21 [phone]

## 2021-03-29 NOTE — Assessment & Plan Note (Signed)
Patient came in today unlike herself. Very quiet. Unable to follow any instructions. Unable to do cranial nerve exam due to inability to follow instructions. Family notes that this confusion came on suddenly when they were getting her ready to get in the car- about an hour ago. Will get her to ER Now for evaluation of possible stroke. We were going to check for UTI here, but will hold on that until she is seen in the ER. Report called to [facility] charge nurse and family is taking her by private vehicle now.

## 2021-03-29 NOTE — Progress Notes (Signed)
BP 131/71   Pulse 69   Temp 98.2 F (36.8 C)   Wt 223 lb 3.2 oz (101.2 kg)   SpO2 98%   BMI 34.96 kg/m    Subjective:    Patient ID: [name], female    DOB: 24-Jun-1941, 80 y.o.   MRN: [id number]  HPI: [name] is a 80 y.o. female  Chief Complaint  Patient presents with  . Knee Pain    Patient daughter and son in law state patient has trouble walking due to knee pain, they were told it was arthritis.   . Foot Swelling    Patient son in law states both feet are red and swollen. Feet have sores. Patient daughter is concerned that using vaseline is making it worse   . Hypertension    Patient son in law states patient is taking Metoprolol instead of Valsartan    Tinnie presents today with her daughter and son in law. They note that she has not been doing well. They were out of town for a while and her other daughter was caring for her. She has been taking her old metoprolol that they found. They did not pick up or start her valsartan. She has not seen any of her specialists. They have not heard from them- it seems like the wrong number is in the chart.   They note that her legs have been swollen and very tender. They have been hot and she has been complaining that they have been burning. She has not been walking as much as she usually has because her legs have been hurting and swelling.   HYPERTENSION Hypertension status: controlled  Satisfied with current treatment? yes Duration of hypertension: chronic BP monitoring frequency:  not checking BP medication side effects:  no Medication compliance: unclear compliance Previous BP meds:metoprolol, losartan- just taking metoprolol 52m now Aspirin: no Recurrent headaches: no Visual changes: no Palpitations: no Dyspnea: no Chest pain: no Lower extremity edema: yes Dizzy/lightheaded: no   SKIN INFECTION Duration: a couple of weeks Location: bilateral lower legs History of trauma in area: unknown Pain: yes Quality:  aching and sore Severity: severe Redness: yes Swelling: yes Oozing: no Pus: no Fevers: no Nausea/vomiting: no Status: worse Treatments attempted:none  Tetanus: UTD  While in the office, patient was unable to follow directions to breathe or stick out her tongue. Family notes that she seems aggitated when they got her ready to come to the doctor's appointment. She seemed much more confused than usual. This started about an hour ago. They have never seen her like this and they are very concerned about what's going on.   Relevant past medical, surgical, family and social history reviewed and updated as indicated. Interim medical history since our last visit reviewed. Allergies and medications reviewed and updated.  Review of Systems  Reason unable to perform ROS: ROS through family members.  Constitutional: Negative.   Respiratory: Negative.   Cardiovascular: Positive for leg swelling. Negative for chest pain and palpitations.  Gastrointestinal: Negative.   Musculoskeletal: Positive for gait problem. Negative for arthralgias, back pain, joint swelling, myalgias, neck pain and neck stiffness.  Skin: Negative.   Psychiatric/Behavioral: Positive for confusion. Negative for agitation, behavioral problems, decreased concentration, dysphoric mood, hallucinations, self-injury, sleep disturbance and suicidal ideas. The patient is not nervous/anxious and is not hyperactive.     Per HPI unless specifically indicated above     Objective:    BP 131/71   Pulse 69   Temp  98.2 F (36.8 C)   Wt 223 lb 3.2 oz (101.2 kg)   SpO2 98%   BMI 34.96 kg/m   Wt Readings from Last 3 Encounters:  03/29/21 222 lb 10.6 oz (101 kg)  03/29/21 223 lb 3.2 oz (101.2 kg)  01/05/21 221 lb 12.8 oz (100.6 kg)    Physical Exam Vitals and nursing note reviewed.  Constitutional:      General: She is not in acute distress.    Appearance: Normal appearance. She is not ill-appearing, toxic-appearing or diaphoretic.   HENT:     Head: Normocephalic and atraumatic.     Right Ear: External ear normal.     Left Ear: External ear normal.     Nose: Nose normal.     Mouth/Throat:     Mouth: Mucous membranes are moist.     Pharynx: Oropharynx is clear.  Eyes:     General: No scleral icterus.       Right eye: No discharge.        Left eye: No discharge.     Extraocular Movements: Extraocular movements intact.     Conjunctiva/sclera: Conjunctivae normal.     Pupils: Pupils are equal, round, and reactive to light.  Cardiovascular:     Rate and Rhythm: Normal rate. Rhythm irregular.     Pulses: Normal pulses.     Heart sounds: Normal heart sounds. No murmur heard. No friction rub. No gallop.   Pulmonary:     Effort: Pulmonary effort is normal. No respiratory distress.     Breath sounds: Normal breath sounds. No stridor. No wheezing, rhonchi or rales.  Chest:     Chest wall: No tenderness.  Musculoskeletal:        General: Swelling present. Normal range of motion.     Cervical back: Normal range of motion and neck supple.     Right lower leg: Edema (1+ bilaterally) present.     Left lower leg: Edema (1+ bilaterally) present.  Skin:    General: Skin is warm and dry.     Capillary Refill: Capillary refill takes less than 2 seconds.     Coloration: Skin is not jaundiced or pale.     Findings: Erythema (red, hot, swollen, painful legs bilaterally L>R) present. No bruising, lesion or rash.  Neurological:     Mental Status: She is alert.     Comments: Unable to evaluate due to inability to follow directions. No facial droop, normal gait  Psychiatric:     Comments: Unable to evaluate due to inability to follow directions.      Results for orders placed or performed in visit on 01/05/21  Microscopic Examination   BLD  Result Value Ref Range   WBC, UA 0-5 0 - 5 /hpf   RBC None seen 0 - 2 /hpf   Epithelial Cells (non renal) None seen 0 - 10 /hpf   Bacteria, UA None seen None seen/Few  Bayer DCA Hb  A1c Waived  Result Value Ref Range   HB A1C (BAYER DCA - WAIVED) 5.4 <7.0 %  CBC with Differential/Platelet  Result Value Ref Range   WBC 7.8 3.4 - 10.8 x10E3/uL   RBC 4.64 3.77 - 5.28 x10E6/uL   Hemoglobin 13.8 11.1 - 15.9 g/dL   Hematocrit 42.3 34.0 - 46.6 %   MCV 91 79 - 97 fL   MCH 29.7 26.6 - 33.0 pg   MCHC 32.6 31.5 - 35.7 g/dL   RDW 13.0 11.7 - 15.4 %  Platelets 321 150 - 450 x10E3/uL   Neutrophils 62 Not Estab. %   Lymphs 22 Not Estab. %   Monocytes 8 Not Estab. %   Eos 7 Not Estab. %   Basos 1 Not Estab. %   Neutrophils Absolute 4.9 1.4 - 7.0 x10E3/uL   Lymphocytes Absolute 1.7 0.7 - 3.1 x10E3/uL   Monocytes Absolute 0.6 0.1 - 0.9 x10E3/uL   EOS (ABSOLUTE) 0.5 (H) 0.0 - 0.4 x10E3/uL   Basophils Absolute 0.1 0.0 - 0.2 x10E3/uL   Immature Granulocytes 0 Not Estab. %   Immature Grans (Abs) 0.0 0.0 - 0.1 x10E3/uL  Comprehensive metabolic panel  Result Value Ref Range   Glucose 91 65 - 99 mg/dL   BUN 18 8 - 27 mg/dL   Creatinine, Ser 0.76 0.57 - 1.00 mg/dL   eGFR 80 >59 mL/min/1.73   BUN/Creatinine Ratio 24 12 - 28   Sodium 141 134 - 144 mmol/L   Potassium 4.4 3.5 - 5.2 mmol/L   Chloride 106 96 - 106 mmol/L   CO2 20 20 - 29 mmol/L   Calcium 9.5 8.7 - 10.3 mg/dL   Total Protein 7.1 6.0 - 8.5 g/dL   Albumin 4.5 3.7 - 4.7 g/dL   Globulin, Total 2.6 1.5 - 4.5 g/dL   Albumin/Globulin Ratio 1.7 1.2 - 2.2   Bilirubin Total 0.8 0.0 - 1.2 mg/dL   Alkaline Phosphatase 93 44 - 121 IU/L   AST 19 0 - 40 IU/L   ALT 13 0 - 32 IU/L  Lipid Panel w/o Chol/HDL Ratio  Result Value Ref Range   Cholesterol, Total 168 100 - 199 mg/dL   Triglycerides 85 0 - 149 mg/dL   HDL 72 >39 mg/dL   VLDL Cholesterol Cal 16 5 - 40 mg/dL   LDL Chol Calc (NIH) 80 0 - 99 mg/dL  Microalbumin, Urine Waived  Result Value Ref Range   Microalb, Ur Waived 10 0 - 19 mg/L   Creatinine, Urine Waived 100 10 - 300 mg/dL   Microalb/Creat Ratio <30 <30 mg/g  TSH  Result Value Ref Range   TSH 0.726 0.450  - 4.500 uIU/mL  Urinalysis, Routine w reflex microscopic  Result Value Ref Range   Specific Gravity, UA 1.020 1.005 - 1.030   pH, UA 5.5 5.0 - 7.5   Color, UA Yellow Yellow   Appearance Ur Clear Clear   Leukocytes,UA 1+ (A) Negative   Protein,UA Negative Negative/Trace   Glucose, UA Negative Negative   Ketones, UA Negative Negative   RBC, UA Trace (A) Negative   Bilirubin, UA Negative Negative   Urobilinogen, Ur 0.2 0.2 - 1.0 mg/dL   Nitrite, UA Negative Negative   Microscopic Examination See below:       Assessment & Plan:   Problem List Items Addressed This Visit      Cardiovascular and Mediastinum   HTN (hypertension)    Under good control on current regimen. Has been taking old metoprolol and not her valsartan. Will continue metoprolol for now. Continue to monitor.        Relevant Medications   metoprolol succinate (TOPROL-XL) 50 MG 24 hr tablet   Atrial fibrillation (HCC)    Has not gotten back in with cardiology. Will get her set back up to see them. Continue xarelto. Call with any concerns.       Relevant Medications   metoprolol succinate (TOPROL-XL) 50 MG 24 hr tablet   Other Relevant Orders   AMB Referral to [place]  Ambulatory referral to Home Health   Chronic venous insufficiency    Not under good control. Was not able to get in with Dr. Moody Bruins. Will get appointment scheduled for them. Encouraged compression socks. Continue to monitor.       Relevant Medications   metoprolol succinate (TOPROL-XL) 50 MG 24 hr tablet   Other Relevant Orders   AMB Referral to [place]   Ambulatory referral to [place]     Other   Acute confusion - Primary    Patient came in today unlike herself. Very quiet. Unable to follow any instructions. Unable to do cranial nerve exam due to inability to follow instructions. Family notes that this confusion came on suddenly when they were getting her ready to get in the car- about an hour ago.  Will get her to ER Now for evaluation of possible stroke. We were going to check for UTI here, but will hold on that until she is seen in the ER. Report called to [facility] charge nurse and family is taking her by private vehicle now.        Other Visit Diagnoses    Memory loss       Has appointment to see neurology 6/8. Now with acute confusion- see discussion there. To go to ER now.    Relevant Orders   AMB Referral to [place]   Ambulatory referral to [place]   Bilateral lower leg cellulitis       Will treat with bactrim. Call with any concerns. Recheck 2 weeks.    Relevant Orders   Ambulatory referral to [place]       Follow up plan: Return in about 2 weeks (around 04/12/2021).

## 2021-03-29 NOTE — ED Triage Notes (Signed)
Pt to ED for altered mental status. Pt not answering questions appropriately. LKW 0900.  Dementia at baseline Pt not following commands cbg 80  Discussed pt with Dr Kerman Passey  Pt in NAD, RR even and unlabored

## 2021-03-29 NOTE — ED Provider Notes (Signed)
[facility] Emergency Department Provider Note ____________________________________________   Event Date/Time   First MD Initiated Contact with Patient 03/29/21 1146     (approximate)  I have reviewed the triage vital signs and the nursing notes.  HISTORY  Chief Complaint Altered Mental Status   HPI [name] is a 80 y.o. femalewho presents to the ED for evaluation of altered mentation.   Chart review indicates history of A. fib, previously prescribed Xarelto, but looks like she took her self off of her medications, per 01/05/2021 PCP note.  Patient son-in-law brings patient to the ED for evaluation of altered mentation, not following commands and seemingly more confused than her baseline. Patient lives at home and daughter and son-in-law have become more involved over the past few weeks, and are in the process of moving in with her to help care for her.  Patient typically ambulates independently.  Patient cannot provide any relevant history due to her confusion and disorientation.  She is pleasant and has no complaints.  Denies pain.  Son-in-law reports that for the past week, patient has been more confused intermittently.  He reports frequently having to repeat himself, and that sometimes she just "does not get it."  He reports that she has not been walking nearly as much, and when she does get up she is very unsteady.  He denies any known falls, syncopal episodes or acute or recent illnesses.  He does report that there are no medications in the house and he does not know where her medications are.  He has not seen or take any medications.  Son-in-law reports that they went to the local walk-in clinic this morning to have her bilateral feet and ankles evaluated, when she was noted to be quite confused, not following commands, and was sent to the ED for further evaluation.  Son-in-law reports that this has been waxing and waning for at least a week now.  He  reports that she was very confused when getting into the car this morning on the way to the doctor's clinic, now improved and seems "close to normal.  "   Past Medical History:  Diagnosis Date  . Cat bite of right lower leg [id number]  . Cellulitis of lower leg 12/07/2014  . Hypertension     Patient Active Problem List   Diagnosis Date Noted  . Acute confusion 03/29/2021  . Chronic venous insufficiency 10/14/2016  . Atrial fibrillation ([place]) 06/15/2015  . Rash 06/15/2015  . HTN (hypertension) 04/11/2015  . Osteoarthritis 04/11/2015    No past surgical history on file.  Prior to Admission medications   Medication Sig Start Date End Date Taking? Authorizing Provider  metoprolol succinate (TOPROL-XL) 50 MG 24 hr tablet Take 1 tablet (50 mg total) by mouth daily. Take with or immediately following a meal. 03/29/21   Johnson, Megan P, DO  rivaroxaban (XARELTO) 20 MG TABS tablet TAKE 1 TABLET BY MOUTH EVERY DAY WITH DINNER 01/05/21   Johnson, Megan P, DO  sulfamethoxazole-trimethoprim (BACTRIM DS) 800-160 MG tablet Take 1 tablet by mouth 2 (two) times daily. 03/29/21   Park Liter P, DO    Allergies Amlodipine and Hctz [hydrochlorothiazide]  Family History  Problem Relation Age of Onset  . Hypertension Mother   . Alcohol abuse Father   . Heart disease Father   . Hypertension Father   . Hyperlipidemia Sister   . Hypertension Sister   . Alcohol abuse Brother   . Hypertension Brother  Social History Social History   Tobacco Use  . Smoking status: Former Smoker    Quit date: 11/04/1978    Years since quitting: 42.4  . Smokeless tobacco: Never Used  Vaping Use  . Vaping Use: Never used  Substance Use Topics  . Alcohol use: No  . Drug use: No    Review of Systems  Unable to be accurately assessed due to patient's disorientation and confusion _______________________________________   PHYSICAL EXAM:  VITAL SIGNS: Vitals:   03/29/21 1320 03/29/21 1400  BP: (!)  158/77 140/62  Pulse: 64 65  Resp: 20 20  Temp:    SpO2: 98% 98%     Constitutional: Alert and oriented only to self. Well appearing and in no acute distress.  Hard of hearing.  Does not seem to follow verbal commands.  I have to hold her arms up in front of her and loudly tell her to keep them up, and she will, similarly with each leg I am able to lift one up and hold it and she can fire her muscles and hold each leg up symmetrically.  But when asked to do so without my assistance, she just laughs and says she feels fine. Eyes: Conjunctivae are normal. PERRL. EOMI. Head: Atraumatic. Nose: No congestion/rhinnorhea. Mouth/Throat: Mucous membranes are moist.  Oropharynx non-erythematous. Neck: No stridor. No cervical spine tenderness to palpation. Cardiovascular: Normal rate, regular rhythm. Grossly normal heart sounds.  Good peripheral circulation. Respiratory: Normal respiratory effort.  No retractions. Lungs CTAB. Gastrointestinal: Soft , nondistended, nontender to palpation. No CVA tenderness. Musculoskeletal: No joint effusions. No signs of acute trauma. Pitting edema to bilateral feet, ankles and lower legs up to the knees.  Overlying skin changes consistent with venous stasis dermatitis.  No open wounds, purulent wounds or leaking areas. Neurologic:   No gross focal neurologic deficits are appreciated. Difficult to obtain full neurologic exam due to her apparent inability to follow commands. Full sensation throughout without apparent deficits. 5/5 strength to bilateral upper extremities.   4/5 strength to bilateral lower extremities symmetrically Cranial nerves are difficult to assess due to her inability to follow commands. Skin:  Skin is warm, dry and intact. No rash noted. Psychiatric: Mood and affect are normal.   ____________________________________________   LABS (all labs ordered are listed, but only abnormal results are displayed)  Labs Reviewed  PROTIME-INR  APTT  CBC   DIFFERENTIAL  COMPREHENSIVE METABOLIC PANEL  URINALYSIS, COMPLETE (UACMP) WITH MICROSCOPIC  CBG MONITORING, ED  CBG MONITORING, ED   ____________________________________________  12 Lead EKG  Atrial fibrillation, rate of 66 bpm.  Normal axis.  Right bundle branch block.  No evidence of acute ischemia ____________________________________________  RADIOLOGY  ED MD interpretation: CT head reviewed by me without evidence of acute intracranial pathology. CXR with cardiomegaly without evidence of acute cardiopulmonary pathology.  Official radiology report(s): CT HEAD WO CONTRAST  Result Date: 03/29/2021 CLINICAL DATA:  Altered mental status. EXAM: CT HEAD WITHOUT CONTRAST TECHNIQUE: Contiguous axial images were obtained from the base of the skull through the vertex without intravenous contrast. COMPARISON:  None. FINDINGS: Brain: Old left frontal and occipital infarctions are noted. No mass effect or midline shift is noted. Ventricular size is within normal limits. There is no evidence of mass lesion, hemorrhage or acute infarction. Vascular: No hyperdense vessel or unexpected calcification. Skull: Normal. Negative for fracture or focal lesion. Sinuses/Orbits: No acute finding. Other: None. IMPRESSION: No acute intracranial abnormality seen. Electronically Signed   By: Marijo Conception  M.D.   On: 03/29/2021 12:01   DG Chest Portable 1 View  Result Date: 03/29/2021 CLINICAL DATA:  Altered mental status EXAM: PORTABLE CHEST 1 VIEW COMPARISON:  None. FINDINGS: Cardiac shadow is enlarged. Aortic calcifications are noted. Lungs are well aerated bilaterally without focal infiltrate. Mild vascular prominence is noted without edema. No bony abnormality is seen. IMPRESSION: No acute abnormality noted. Electronically Signed   By: Inez Catalina M.D.   On: 03/29/2021 12:50    ____________________________________________   PROCEDURES and INTERVENTIONS  Procedure(s) performed (including Critical  Care):  .1-3 Lead EKG Interpretation Performed by: Vladimir Crofts, MD Authorized by: Vladimir Crofts, MD     Interpretation: normal     ECG rate:  66   ECG rate assessment: normal     Rhythm: atrial fibrillation     Ectopy: none     Conduction: normal      Medications  sodium chloride flush (NS) 0.9 % injection 3 mL (3 mLs Intravenous Not Given 03/29/21 1202)    ____________________________________________   MDM / ED COURSE   80 year old woman presents to the ED with worsening confusion, not following commands concerning for possible stroke in the setting of her noncompliance with her anticoagulation.  Exam demonstrates well-appearing patient who is difficult to examine due to her not following commands.  She has no gross focal neurologic deficits to her extremities or cranial nerves my limited assessment.  Blood work is benign without evidence of acute derangements, metabolic pathology or systemic illness.  CT head demonstrates no ICH or mass.  Due to her not being on her anticoagulation, most concerned about an acute stroke.  Son-in-law reports this is been going on for essentially 1 week, due to this and the overall uncertainty of the timeframe, no evidence of large vessel occlusion, stroke alert was not called and she would not be a candidate for intervention at this time.  Patient signed out to oncoming divider to follow-up on MRI brain to assess for acute CVA.   Clinical Course as of 03/29/21 1437  Thu Mar 29, 2021  1250 Educated patient and son along largely benign work-up.  We discussed my recommendation for MRI brain, they are agreeable. [DS]  1275 Patient continues to be pleasantly disoriented.  No complaints. [DS]    Clinical Course User Index [DS] Vladimir Crofts, MD    ____________________________________________   FINAL CLINICAL IMPRESSION(S) / ED DIAGNOSES  Final diagnoses:  Confusion     ED Discharge Orders    None       Dewan Emond   Note:  This document  was prepared using Dragon voice recognition software and may include unintentional dictation errors.   Vladimir Crofts, MD 03/29/21 [phone]

## 2021-03-29 NOTE — ED Notes (Signed)
RT called for ABG.

## 2021-03-30 ENCOUNTER — Observation Stay: Payer: Medicare HMO

## 2021-03-30 DIAGNOSIS — I4891 Unspecified atrial fibrillation: Secondary | ICD-10-CM | POA: Diagnosis not present

## 2021-03-30 DIAGNOSIS — I6930 Unspecified sequelae of cerebral infarction: Secondary | ICD-10-CM | POA: Diagnosis present

## 2021-03-30 DIAGNOSIS — E876 Hypokalemia: Secondary | ICD-10-CM | POA: Diagnosis present

## 2021-03-30 DIAGNOSIS — R488 Other symbolic dysfunctions: Secondary | ICD-10-CM | POA: Diagnosis not present

## 2021-03-30 DIAGNOSIS — I4821 Permanent atrial fibrillation: Secondary | ICD-10-CM | POA: Diagnosis not present

## 2021-03-30 DIAGNOSIS — R410 Disorientation, unspecified: Secondary | ICD-10-CM | POA: Diagnosis not present

## 2021-03-30 DIAGNOSIS — I639 Cerebral infarction, unspecified: Secondary | ICD-10-CM | POA: Diagnosis present

## 2021-03-30 DIAGNOSIS — T43595A Adverse effect of other antipsychotics and neuroleptics, initial encounter: Secondary | ICD-10-CM | POA: Diagnosis not present

## 2021-03-30 DIAGNOSIS — Z9114 Patient's other noncompliance with medication regimen: Secondary | ICD-10-CM | POA: Diagnosis not present

## 2021-03-30 DIAGNOSIS — L03116 Cellulitis of left lower limb: Secondary | ICD-10-CM | POA: Diagnosis not present

## 2021-03-30 DIAGNOSIS — I6340 Cerebral infarction due to embolism of unspecified cerebral artery: Secondary | ICD-10-CM | POA: Diagnosis not present

## 2021-03-30 DIAGNOSIS — B965 Pseudomonas (aeruginosa) (mallei) (pseudomallei) as the cause of diseases classified elsewhere: Secondary | ICD-10-CM | POA: Diagnosis present

## 2021-03-30 DIAGNOSIS — R509 Fever, unspecified: Secondary | ICD-10-CM | POA: Diagnosis not present

## 2021-03-30 DIAGNOSIS — I872 Venous insufficiency (chronic) (peripheral): Secondary | ICD-10-CM | POA: Diagnosis not present

## 2021-03-30 DIAGNOSIS — I878 Other specified disorders of veins: Secondary | ICD-10-CM | POA: Diagnosis present

## 2021-03-30 DIAGNOSIS — B370 Candidal stomatitis: Secondary | ICD-10-CM | POA: Diagnosis not present

## 2021-03-30 DIAGNOSIS — Z87891 Personal history of nicotine dependence: Secondary | ICD-10-CM | POA: Diagnosis not present

## 2021-03-30 DIAGNOSIS — Z8673 Personal history of transient ischemic attack (TIA), and cerebral infarction without residual deficits: Secondary | ICD-10-CM | POA: Diagnosis not present

## 2021-03-30 DIAGNOSIS — L03115 Cellulitis of right lower limb: Secondary | ICD-10-CM | POA: Diagnosis not present

## 2021-03-30 DIAGNOSIS — L03119 Cellulitis of unspecified part of limb: Secondary | ICD-10-CM | POA: Diagnosis not present

## 2021-03-30 DIAGNOSIS — G9341 Metabolic encephalopathy: Secondary | ICD-10-CM | POA: Diagnosis present

## 2021-03-30 DIAGNOSIS — I517 Cardiomegaly: Secondary | ICD-10-CM | POA: Diagnosis not present

## 2021-03-30 DIAGNOSIS — M6281 Muscle weakness (generalized): Secondary | ICD-10-CM | POA: Diagnosis not present

## 2021-03-30 DIAGNOSIS — Z6831 Body mass index (BMI) 31.0-31.9, adult: Secondary | ICD-10-CM | POA: Diagnosis not present

## 2021-03-30 DIAGNOSIS — F0390 Unspecified dementia without behavioral disturbance: Secondary | ICD-10-CM | POA: Diagnosis present

## 2021-03-30 DIAGNOSIS — M6282 Rhabdomyolysis: Secondary | ICD-10-CM | POA: Diagnosis not present

## 2021-03-30 DIAGNOSIS — I6310 Cerebral infarction due to embolism of unspecified precerebral artery: Secondary | ICD-10-CM | POA: Diagnosis not present

## 2021-03-30 DIAGNOSIS — Z20822 Contact with and (suspected) exposure to covid-19: Secondary | ICD-10-CM | POA: Diagnosis not present

## 2021-03-30 DIAGNOSIS — R69 Illness, unspecified: Secondary | ICD-10-CM | POA: Diagnosis not present

## 2021-03-30 DIAGNOSIS — G210 Malignant neuroleptic syndrome: Secondary | ICD-10-CM | POA: Diagnosis not present

## 2021-03-30 DIAGNOSIS — E785 Hyperlipidemia, unspecified: Secondary | ICD-10-CM | POA: Diagnosis not present

## 2021-03-30 DIAGNOSIS — I6523 Occlusion and stenosis of bilateral carotid arteries: Secondary | ICD-10-CM | POA: Diagnosis not present

## 2021-03-30 DIAGNOSIS — F05 Delirium due to known physiological condition: Secondary | ICD-10-CM | POA: Diagnosis not present

## 2021-03-30 DIAGNOSIS — R279 Unspecified lack of coordination: Secondary | ICD-10-CM | POA: Diagnosis not present

## 2021-03-30 DIAGNOSIS — E669 Obesity, unspecified: Secondary | ICD-10-CM | POA: Diagnosis present

## 2021-03-30 DIAGNOSIS — J9811 Atelectasis: Secondary | ICD-10-CM | POA: Diagnosis not present

## 2021-03-30 DIAGNOSIS — N390 Urinary tract infection, site not specified: Secondary | ICD-10-CM | POA: Diagnosis present

## 2021-03-30 DIAGNOSIS — R4182 Altered mental status, unspecified: Secondary | ICD-10-CM | POA: Diagnosis not present

## 2021-03-30 DIAGNOSIS — I10 Essential (primary) hypertension: Secondary | ICD-10-CM | POA: Diagnosis not present

## 2021-03-30 DIAGNOSIS — Z7901 Long term (current) use of anticoagulants: Secondary | ICD-10-CM | POA: Diagnosis not present

## 2021-03-30 DIAGNOSIS — R5381 Other malaise: Secondary | ICD-10-CM | POA: Diagnosis not present

## 2021-03-30 DIAGNOSIS — I6389 Other cerebral infarction: Secondary | ICD-10-CM | POA: Diagnosis not present

## 2021-03-30 DIAGNOSIS — R2681 Unsteadiness on feet: Secondary | ICD-10-CM | POA: Diagnosis not present

## 2021-03-30 DIAGNOSIS — I6349 Cerebral infarction due to embolism of other cerebral artery: Secondary | ICD-10-CM | POA: Diagnosis not present

## 2021-03-30 LAB — CBC
HCT: 37.4 % (ref 36.0–46.0)
Hemoglobin: 12.5 g/dL (ref 12.0–15.0)
MCH: 30.1 pg (ref 26.0–34.0)
MCHC: 33.4 g/dL (ref 30.0–36.0)
MCV: 90.1 fL (ref 80.0–100.0)
Platelets: 277 10*3/uL (ref 150–400)
RBC: 4.15 MIL/uL (ref 3.87–5.11)
RDW: 12.9 % (ref 11.5–15.5)
WBC: 6.6 10*3/uL (ref 4.0–10.5)
nRBC: 0 % (ref 0.0–0.2)

## 2021-03-30 LAB — COMPREHENSIVE METABOLIC PANEL
ALT: 15 U/L (ref 0–44)
AST: 23 U/L (ref 15–41)
Albumin: 3.3 g/dL — ABNORMAL LOW (ref 3.5–5.0)
Alkaline Phosphatase: 63 U/L (ref 38–126)
Anion gap: 8 (ref 5–15)
BUN: 15 mg/dL (ref 8–23)
CO2: 24 mmol/L (ref 22–32)
Calcium: 8.7 mg/dL — ABNORMAL LOW (ref 8.9–10.3)
Chloride: 106 mmol/L (ref 98–111)
Creatinine, Ser: 0.73 mg/dL (ref 0.44–1.00)
GFR, Estimated: >60 mL/min (ref >60)
Glucose, Bld: 82 mg/dL (ref 70–99)
Potassium: 3.9 mmol/L (ref 3.5–5.1)
Sodium: 138 mmol/L (ref 135–145)
Total Bilirubin: 1.3 mg/dL — ABNORMAL HIGH (ref 0.3–1.2)
Total Protein: 6.3 g/dL — ABNORMAL LOW (ref 6.5–8.1)

## 2021-03-30 LAB — URINALYSIS, COMPLETE (UACMP) WITH MICROSCOPIC
Bacteria, UA: NONE SEEN
Bilirubin Urine: NEGATIVE
Glucose, UA: NEGATIVE mg/dL
Ketones, ur: 5 mg/dL — AB
Leukocytes,Ua: NEGATIVE
Nitrite: NEGATIVE
Protein, ur: NEGATIVE mg/dL
Specific Gravity, Urine: 1.016 (ref 1.005–1.030)
pH: 5 (ref 5.0–8.0)

## 2021-03-30 LAB — LIPID PANEL
Cholesterol: 133 mg/dL (ref 0–200)
HDL: 50 mg/dL (ref >40)
LDL Cholesterol: 73 mg/dL (ref 0–99)
Total CHOL/HDL Ratio: 2.7 RATIO
Triglycerides: 50 mg/dL (ref <150)
VLDL: 10 mg/dL (ref 0–40)

## 2021-03-30 LAB — VITAMIN B12: Vitamin B-12: 384 pg/mL (ref 180–914)

## 2021-03-30 LAB — HEMOGLOBIN A1C
Hgb A1c MFr Bld: 5.5 % (ref 4.8–5.6)
Mean Plasma Glucose: 111 mg/dL

## 2021-03-30 IMAGING — MR MR HEAD W/O CM
9 of 11 series · 36 of 48 positions shown · non-contrast
Comparison: Head CT yesterday.

CLINICAL DATA: Mental status changes. New and worsening confusion.
Atrial fibrillation. Chronic vascular disease.

EXAM:
MRI HEAD WITHOUT CONTRAST
TECHNIQUE: Multiplanar, multiecho pulse sequences of the brain and surrounding
structures were obtained without intravenous contrast.

[Series 2: ax dwi_tracew · axial · 3.0mm · 0.71mm/px · z∈[-110,+55]mm · 6 of 56 slices shown]
[im 1/56]
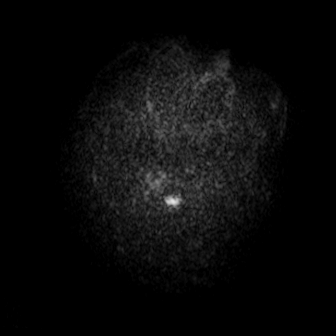
[im 12/56]
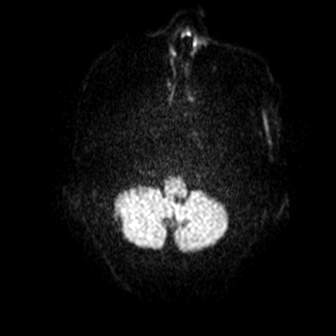
[im 23/56]
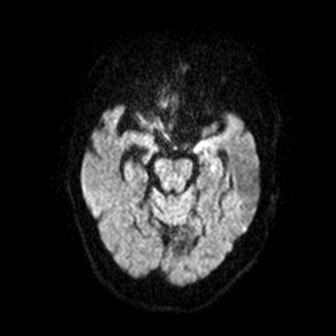
[im 34/56]
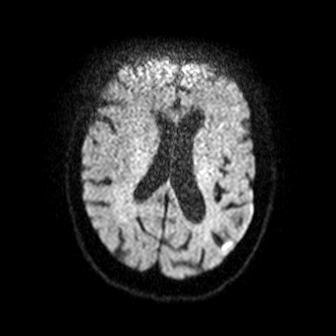
[im 45/56]
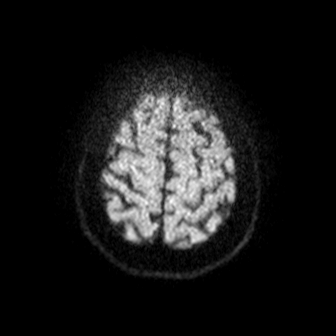
[im 56/56]
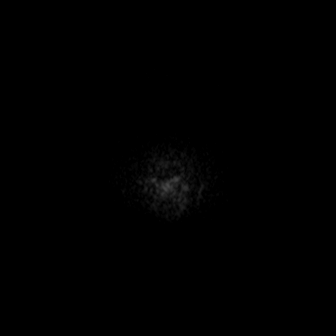

[Series 3: ax dwi_adc · axial · 3.0mm · 0.71mm/px · z∈[-110,+55]mm · 6 of 56 slices shown]
[im 1/56]
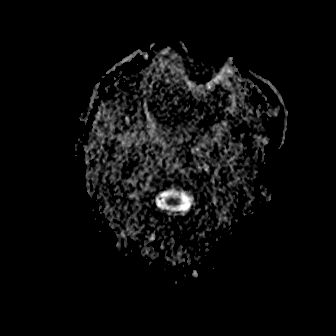
[im 12/56]
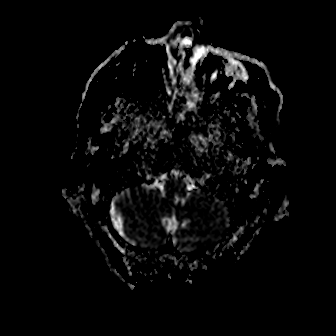
[im 23/56]
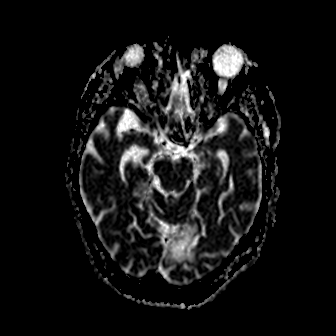
[im 34/56]
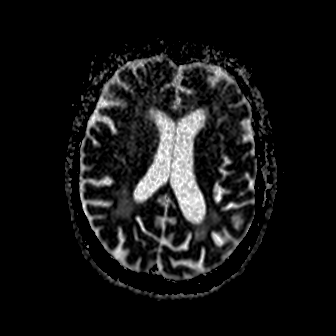
[im 45/56]
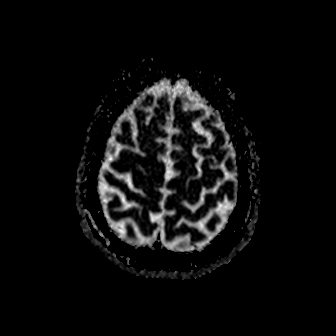
[im 56/56]
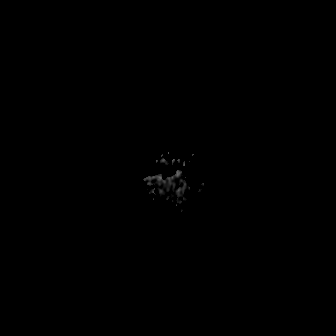

[Series 4: cor dwi_tracew · coronal · 5.0mm · 0.68mm/px · 4 of 40 slices shown]
[im 1/40]
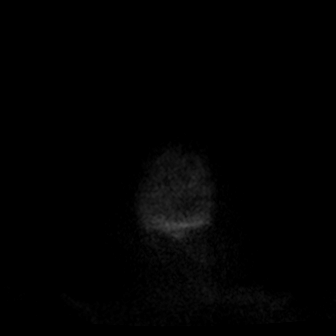
[im 14/40]
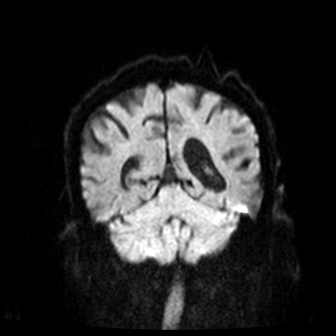
[im 27/40]
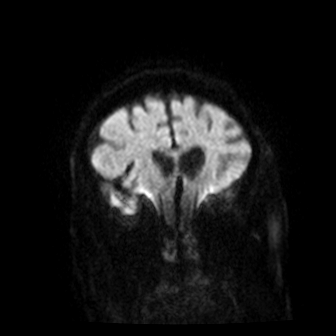
[im 40/40]
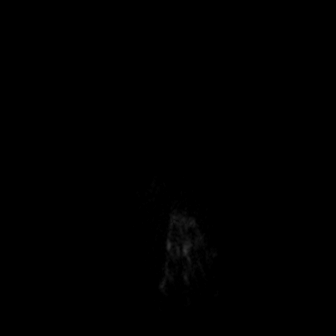

[Series 5: cor dwi_adc · coronal · 5.0mm · 0.68mm/px · 2 of 40 slices shown]
[im 1/40]
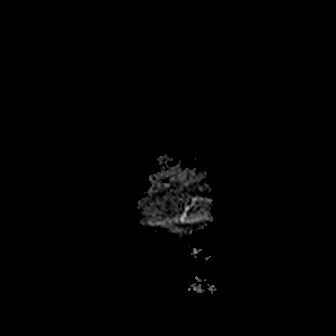
[im 14/40]
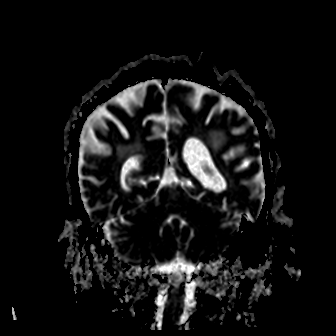

[Series 6: FLAIR · axial · 3.0mm · 0.69mm/px · z∈[-108,+54]mm · 6 of 55 slices shown]
[im 1/55]
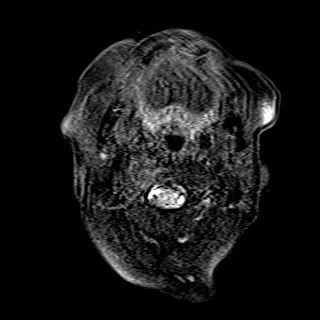
[im 11/55]
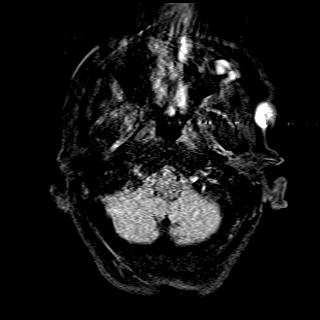
[im 22/55]
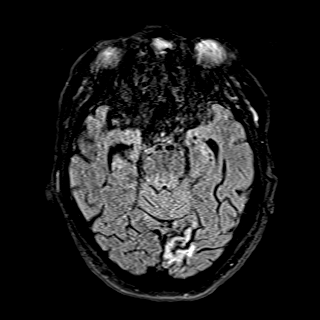
[im 33/55]
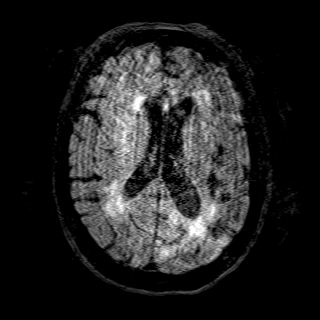
[im 44/55]
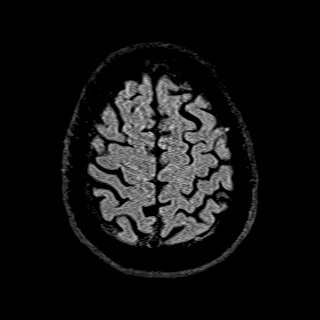
[im 55/55]
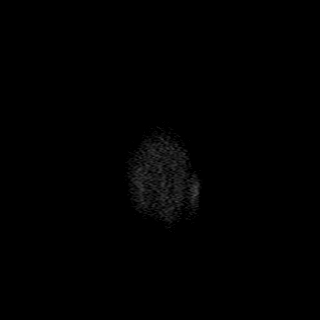

[Series 11: T2 · axial · 5.0mm · 0.45mm/px · z∈[-105,+51]mm · 3 of 27 slices shown (1 of 2)]
[im 1/27]
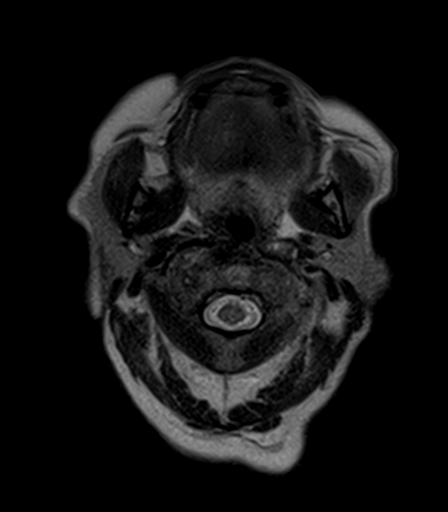
[im 14/27]
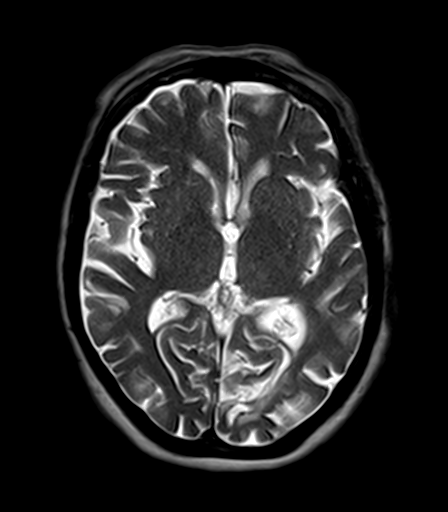
[im 27/27]
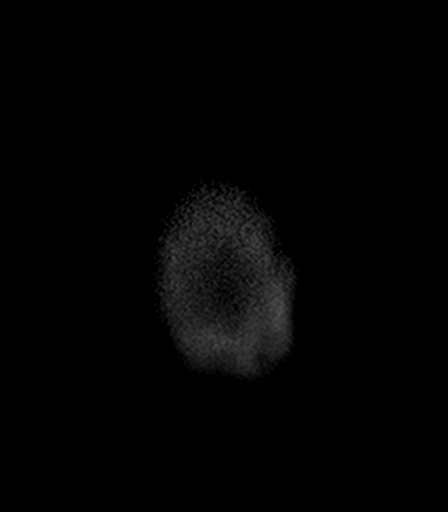

[Series 12: T1 · axial · 5.0mm · 0.90mm/px · z∈[-105,+51]mm · 3 of 27 slices shown (1 of 2)]
[im 1/27]
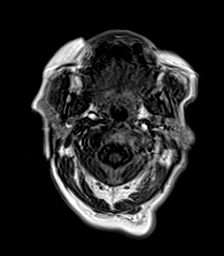
[im 14/27]
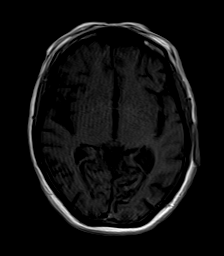
[im 27/27]
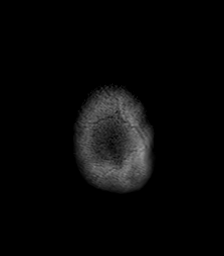

[Series 13: T1 · sagittal · 5.0mm · 0.47mm/px · 3 of 24 slices shown (2 of 2)]
[im 1/24]
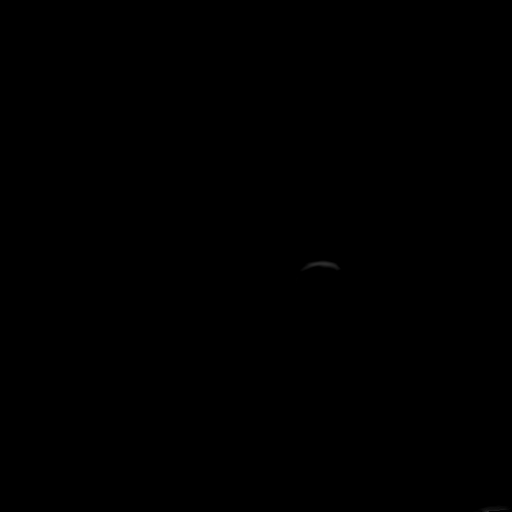
[im 12/24]
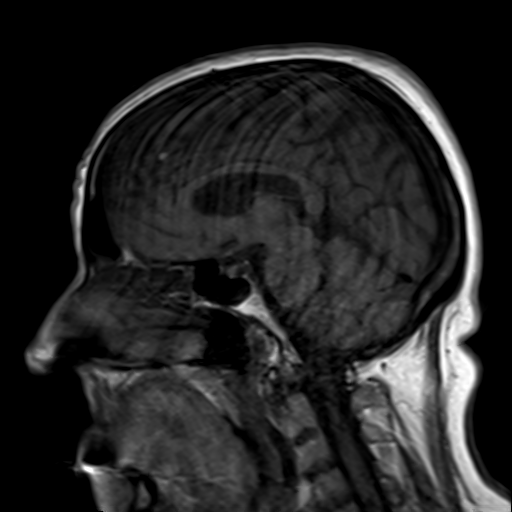
[im 24/24]
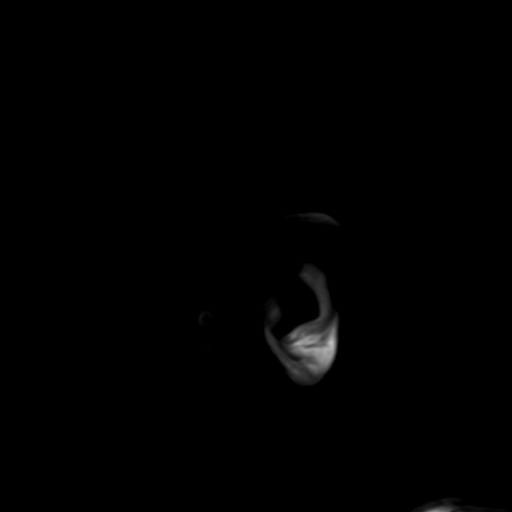

[Series 14: T2 · coronal · 5.0mm · 0.45mm/px · 3 of 31 slices shown (2 of 2)]
[im 1/31]
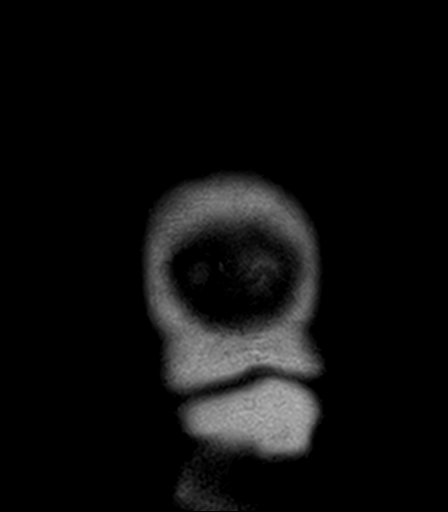
[im 16/31]
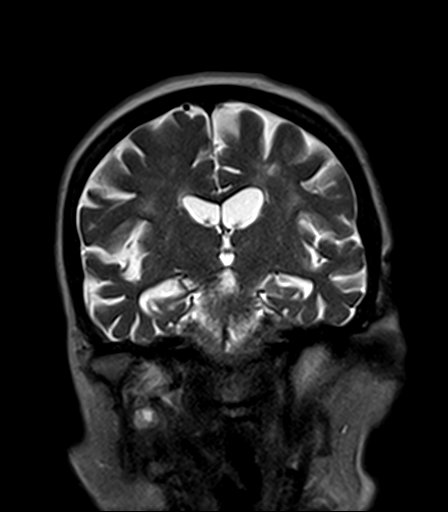
[im 31/31]
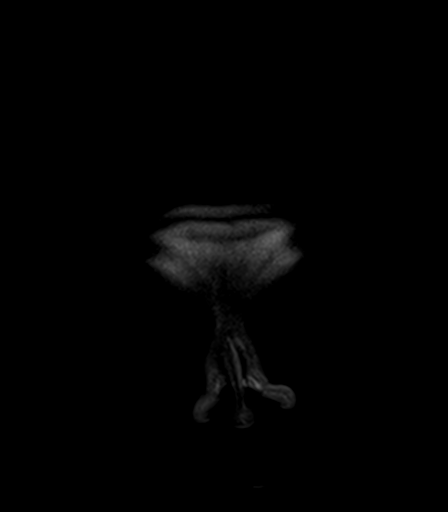

[36 of 48 positions shown; findings below may reference images not displayed]

FINDINGS: Brain: Diffusion imaging shows an 8 mm acute infarction affecting
the cortical surface of the deep insula on the left. Second
subcentimeter acute cortical infarction in the left parietal lobe.
Findings suggest embolic disease in the left carotid circulation. No
abnormality is seen affecting the brainstem. Old small vessel
cerebellar infarction on the right. Cerebral hemispheres elsewhere
show old cortical and subcortical infarction in the left occipital
lobe and left frontal lobe and extensive chronic small-vessel
ischemic changes of the hemispheric white matter. No sign mass,
acute hemorrhage, hydrocephalus or extra-axial collection.

Vascular: Major vessels at the base of the brain show flow.

Skull and upper cervical spine: Negative

Sinuses/Orbits: Mucosal thickening of the left maxillary sinus.
Orbits negative.

Other: None
IMPRESSION: Two acute subcentimeter infarction is, affecting the deep insula on
the left in the left parietal cortex. Findings consistent with
embolic disease from the left carotid system. No large vessel
infarction.

Old cortical infarctions in the left occipital lobe and left frontal
lobe.

Chronic small-vessel ischemic changes elsewhere, most prominently
affecting the hemispheric white matter as outlined.

## 2021-03-30 IMAGING — MR MR MRA HEAD W/O CM
1 series · 20 of 48 positions shown · non-contrast
Comparison: No pertinent prior exam.

CLINICAL DATA: Stroke, follow-up

EXAM:
MRA HEAD WITHOUT CONTRAST
TECHNIQUE: Angiographic images of the Circle of Willis were acquired using MRA
technique without intravenous contrast.

[Series 5: TOF · axial · 0.5mm · 0.41mm/px · z∈[-108,-12]mm · 20 of 205 slices shown]
[im 1/205]
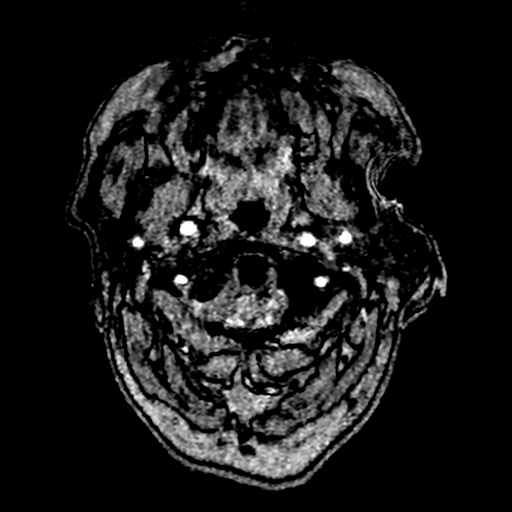
[im 5/205]
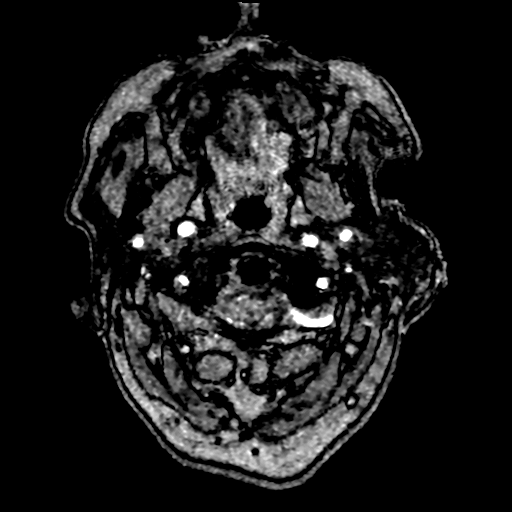
[im 9/205]
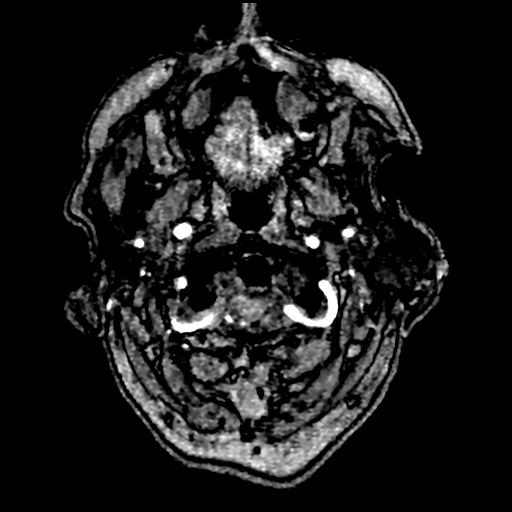
[im 14/205]
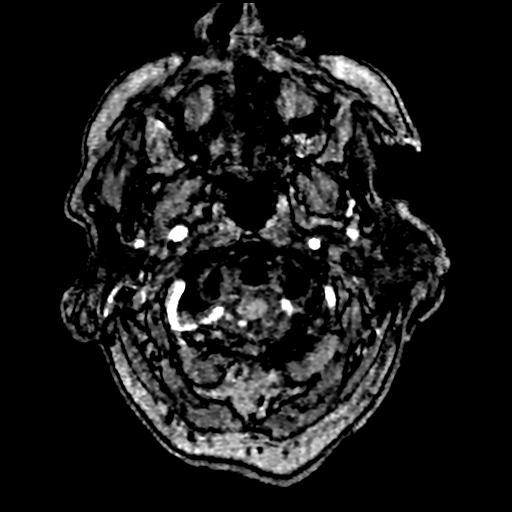
[im 18/205]
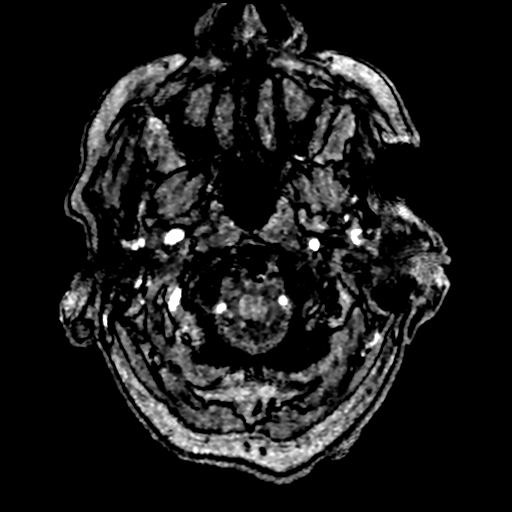
[im 22/205]
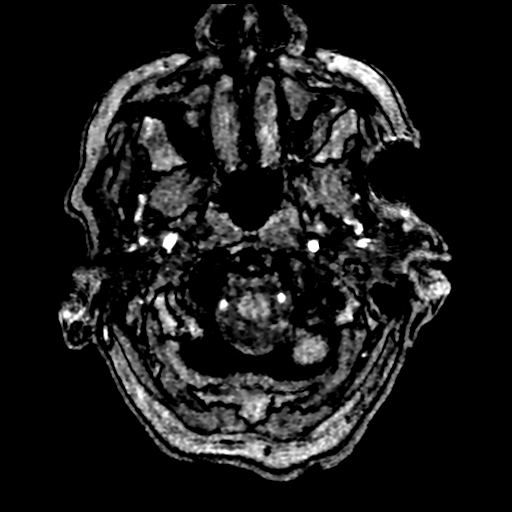
[im 27/205]
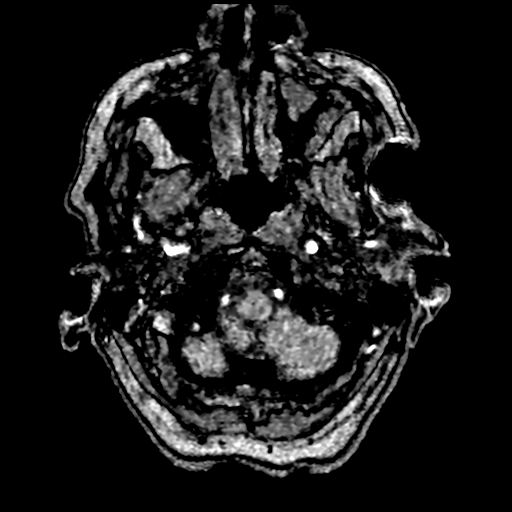
[im 31/205]
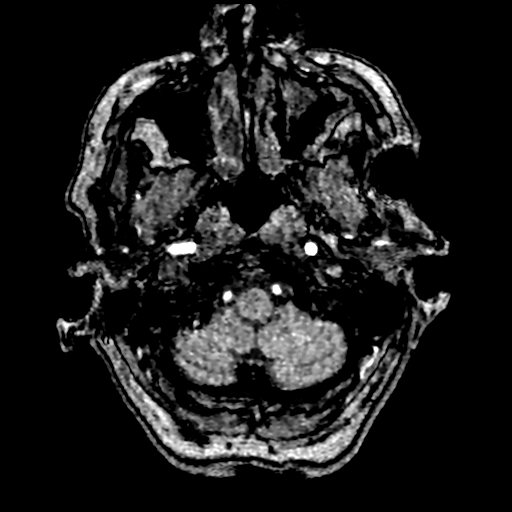
[im 35/205]
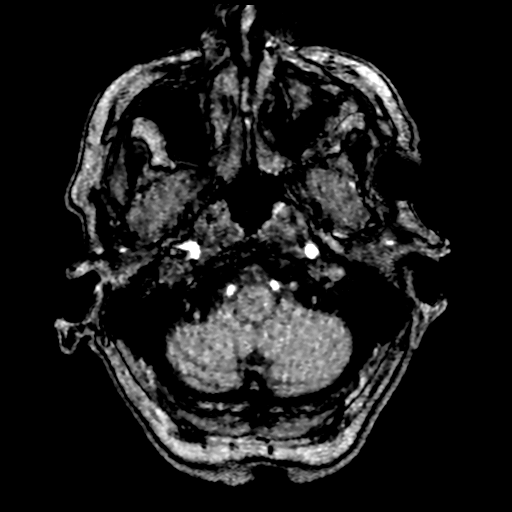
[im 40/205]
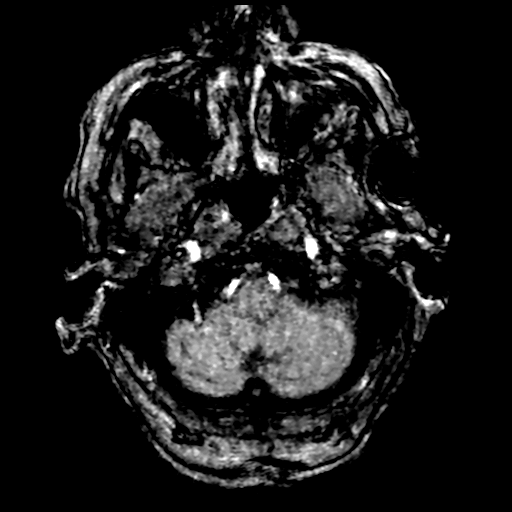
[im 44/205]
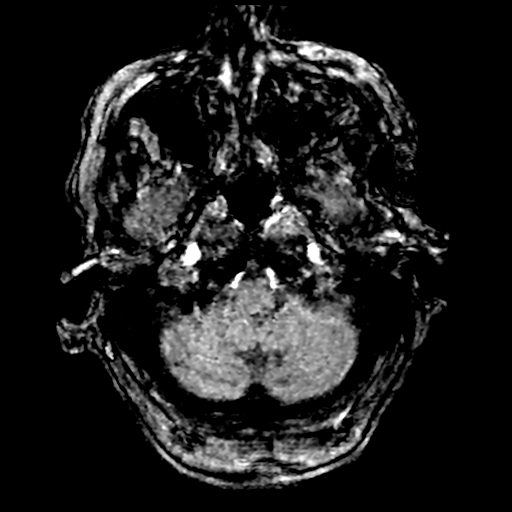
[im 48/205]
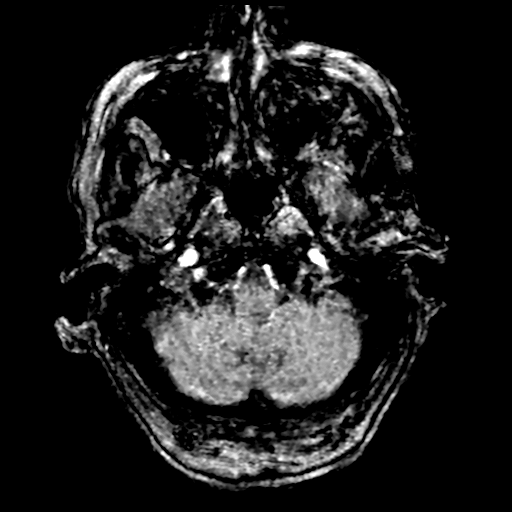
[im 66/205]
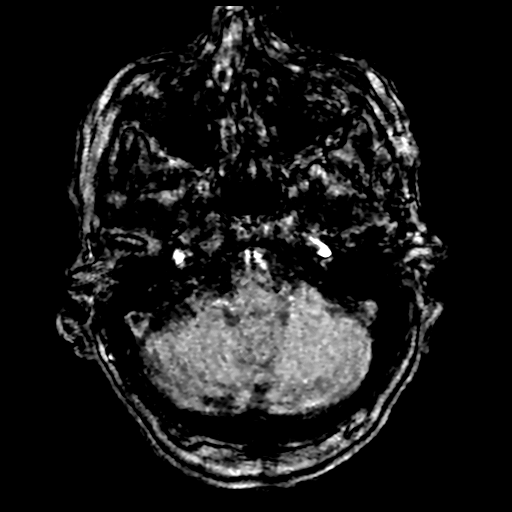
[im 92/205]
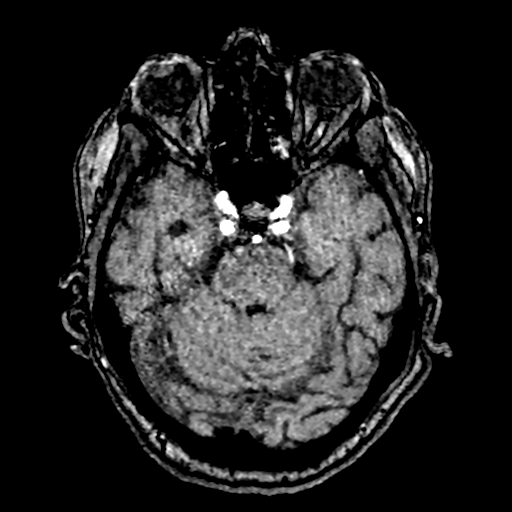
[im 105/205]
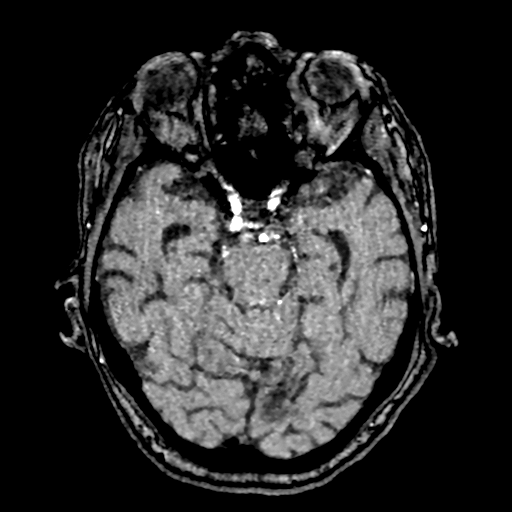
[im 118/205]
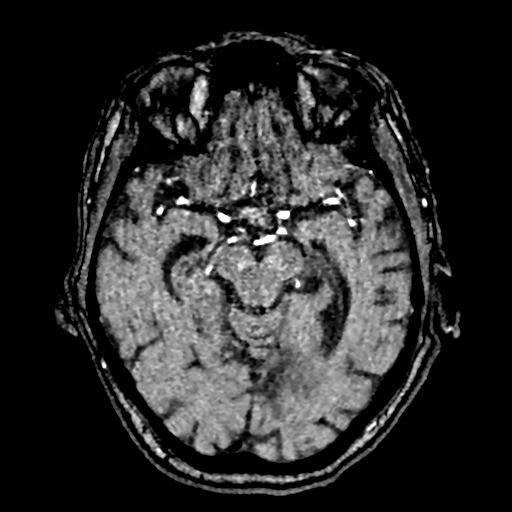
[im 144/205]
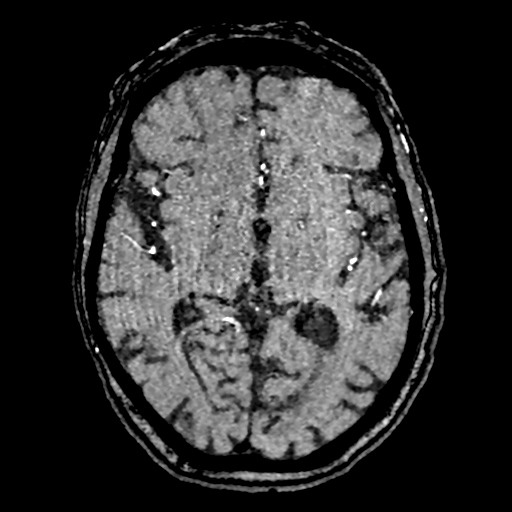
[im 170/205]
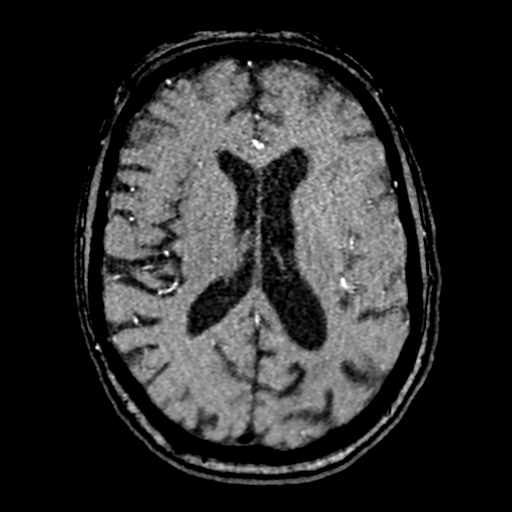
[im 174/205]
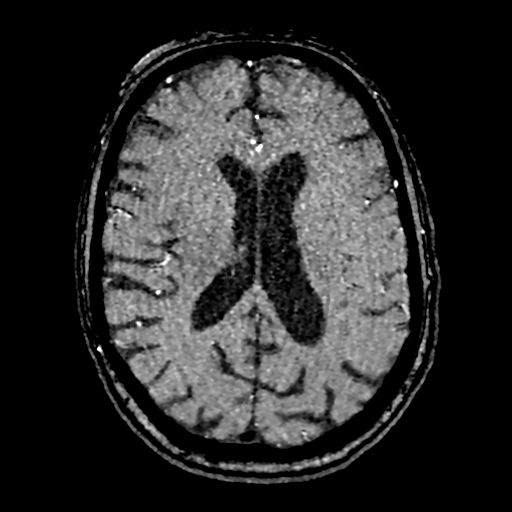
[im 196/205]
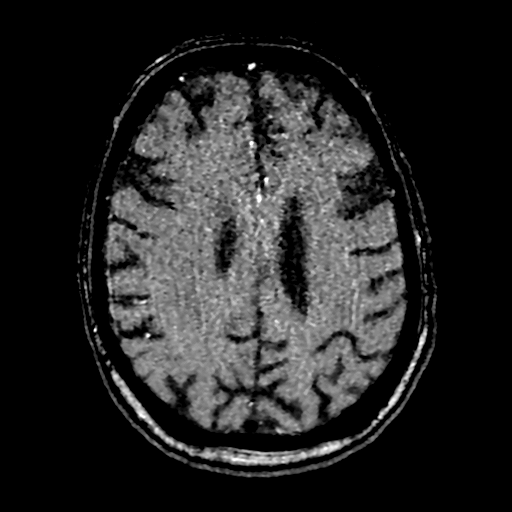

[20 of 48 positions shown; findings below may reference images not displayed]

FINDINGS: Anterior circulation: Intracranial internal carotid arteries are
patent. Anterior and middle cerebral arteries are patent. Anterior
communicating artery is present.

Posterior circulation: Intracranial vertebral arteries, basilar
artery, and posterior cerebral arteries are patent. A small right
posterior communicating artery is present.

Other: No aneurysm.
IMPRESSION: No proximal intracranial vessel occlusion or significant stenosis.

## 2021-03-30 IMAGING — US US CAROTID DUPLEX BILAT
1 series · 13 of 24 positions shown · non-contrast
Comparison: [DATE]

CLINICAL DATA: Hypertension, small acute strokes by MRI, concern
for embolic source

EXAM:
BILATERAL CAROTID DUPLEX ULTRASOUND
TECHNIQUE: Gray scale imaging, color Doppler and duplex ultrasound were
performed of bilateral carotid and vertebral arteries in the neck.

[Series 1: us carotid bilateral · 13 of 64 slices shown]
[im 1/64]
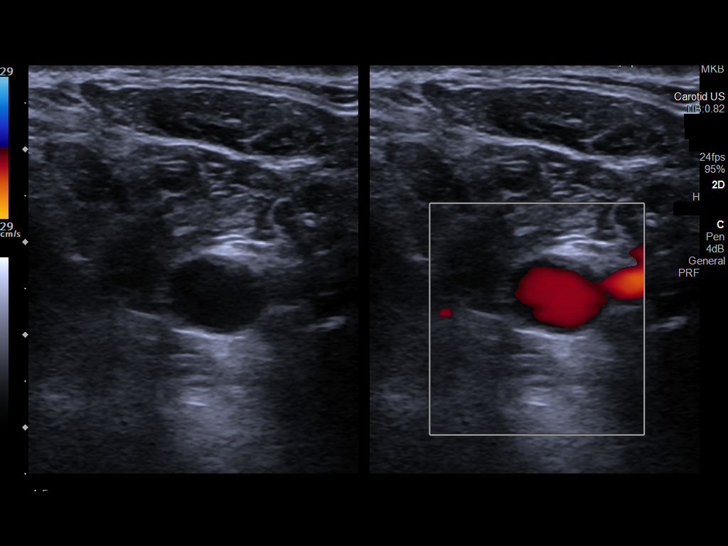
[im 6/64]
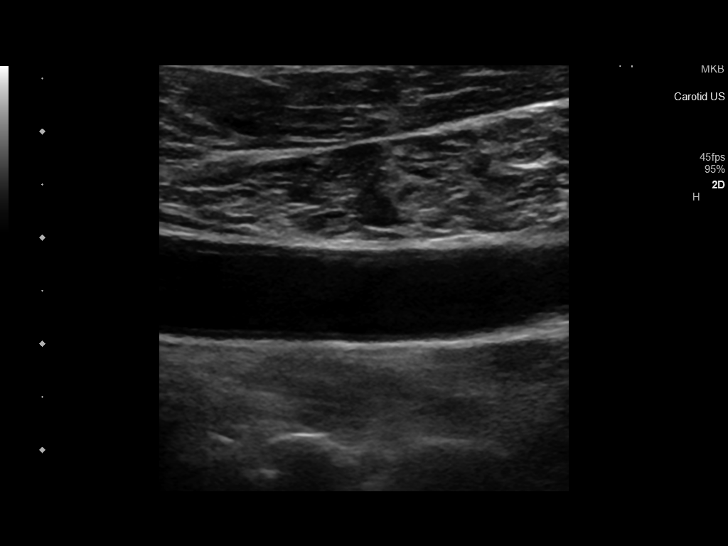
[im 11/64]
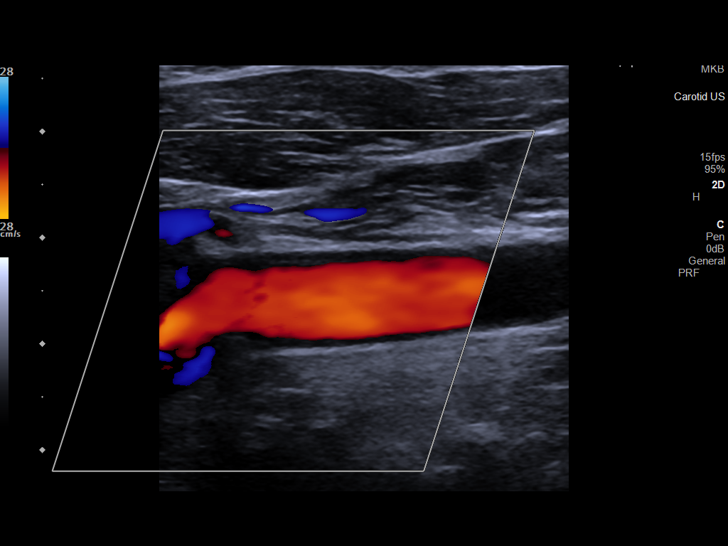
[im 17/64]
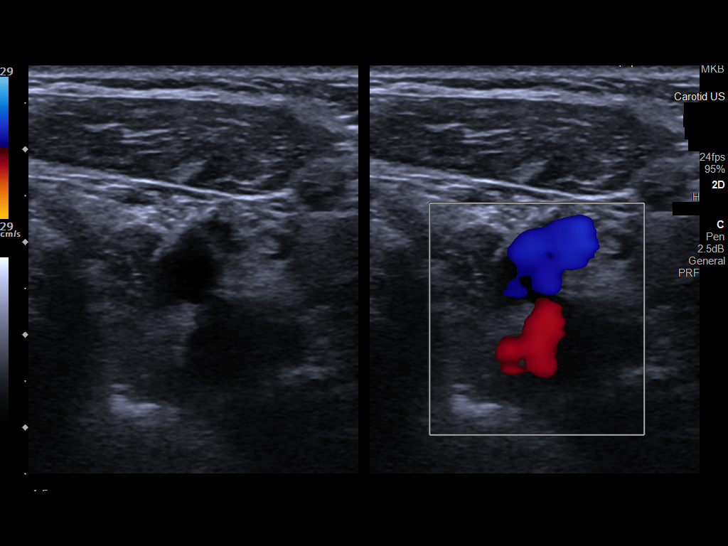
[im 22/64]
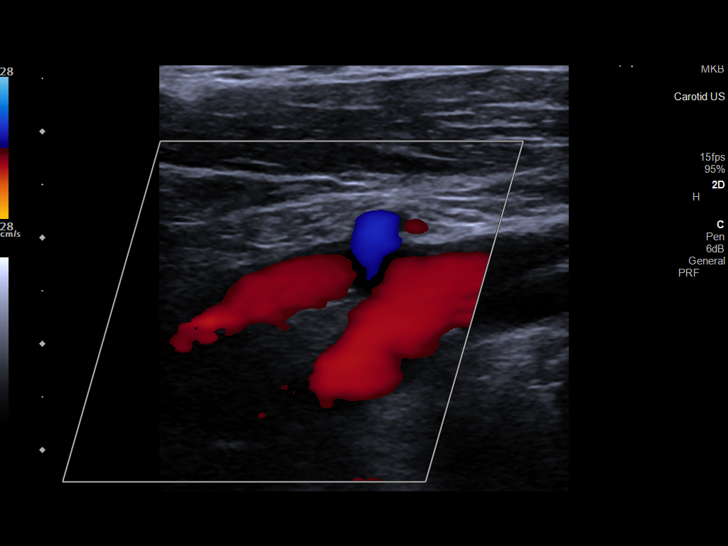
[im 28/64]
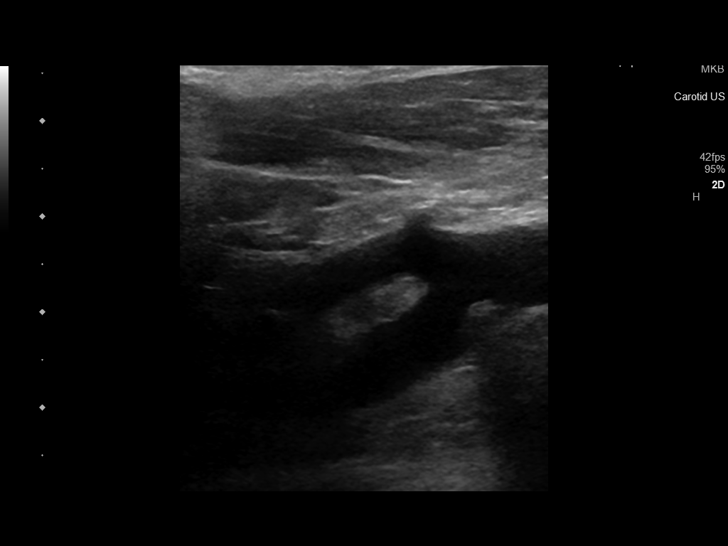
[im 33/64]
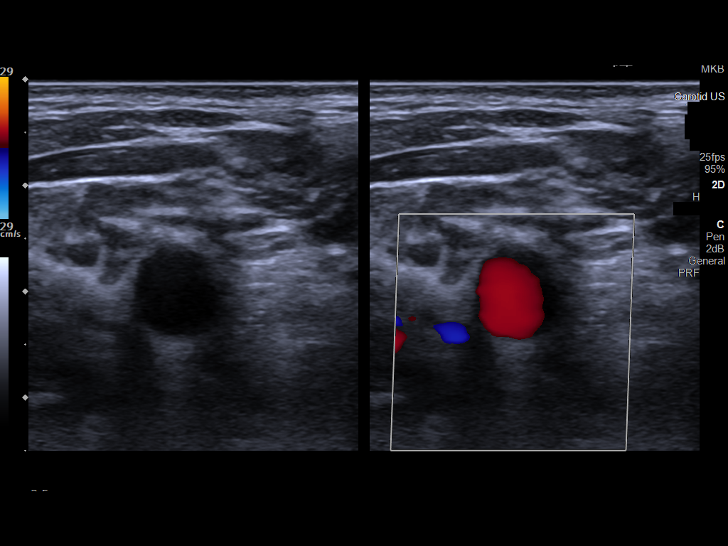
[im 36/64]
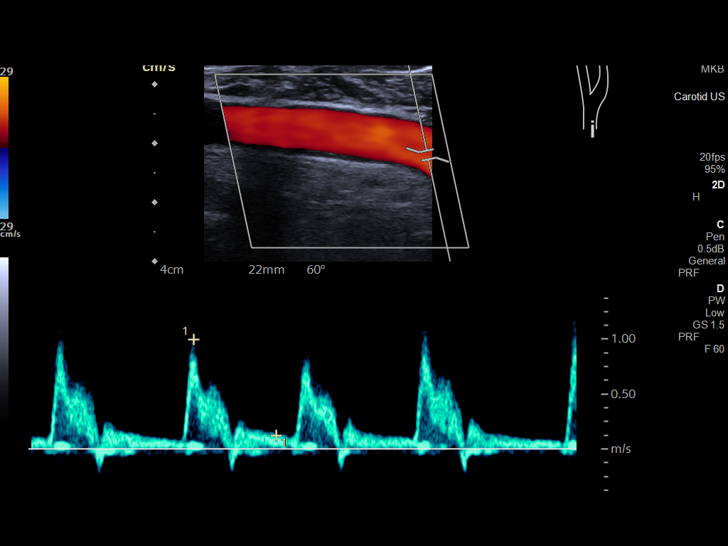
[im 42/64]
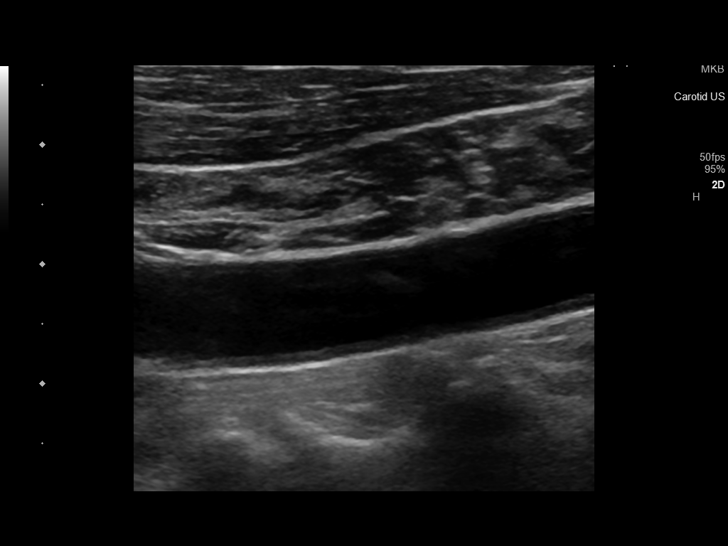
[im 47/64]
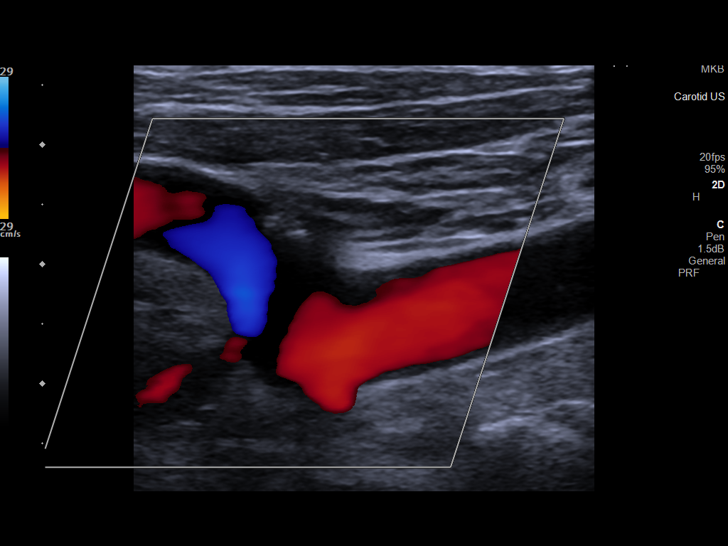
[im 53/64]
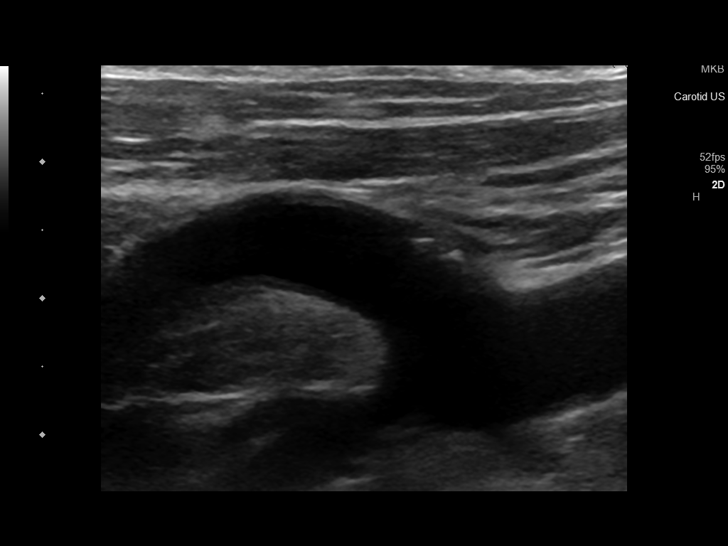
[im 58/64]
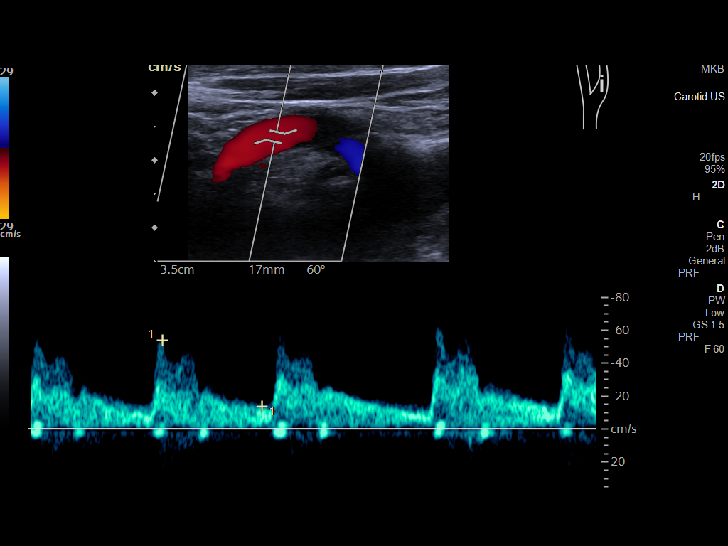
[im 64/64]
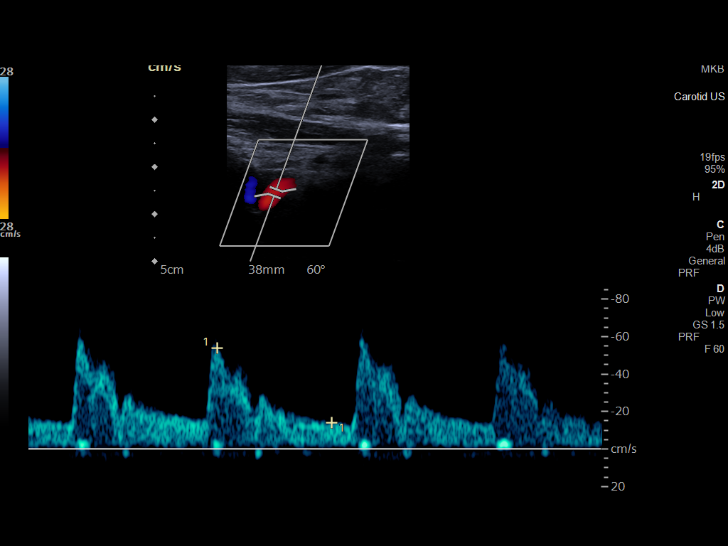

[13 of 24 positions shown; findings below may reference images not displayed]

FINDINGS: Criteria: Quantification of carotid stenosis is based on velocity
parameters that correlate the residual internal carotid diameter
with NASCET-based stenosis levels, using the diameter of the distal
internal carotid lumen as the denominator for stenosis measurement.

The following velocity measurements were obtained:

RIGHT

ICA: 61/13 cm/sec

CCA: 60/11 cm/sec

SYSTOLIC ICA/CCA RATIO:

ECA: 108 cm/sec

LEFT

ICA: 94/28 cm/sec

CCA: 94/25 cm/sec

SYSTOLIC ICA/CCA RATIO:

ECA: 101 cm/sec

RIGHT CAROTID ARTERY: Minor intimal thickening and minimal plaque
formation. No hemodynamically significant right ICA stenosis,
velocity elevation, or turbulent flow. Degree of narrowing less than
50%.

RIGHT VERTEBRAL ARTERY:  Normal antegrade flow

LEFT CAROTID ARTERY: Similar minor intimal thickening and minimal
plaque formation. No hemodynamically significant left ICA stenosis,
velocity elevation, or turbulent flow.

LEFT VERTEBRAL ARTERY:  Normal antegrade flow
IMPRESSION: Minor carotid atherosclerosis. No hemodynamically significant ICA
stenosis. Degree of narrowing less than 50% bilaterally by
ultrasound criteria.

Patent antegrade vertebral flow bilaterally

## 2021-03-30 MED ORDER — ACETAMINOPHEN 650 MG RE SUPP: 650.0000 mg | RECTAL | Status: DC | PRN

## 2021-03-30 MED ORDER — STROKE: EARLY STAGES OF RECOVERY BOOK: Freq: Once | Status: DC

## 2021-03-30 MED ORDER — HALOPERIDOL LACTATE 5 MG/ML IJ SOLN
INTRAMUSCULAR | Status: AC
  Filled 2021-03-30: qty 1

## 2021-03-30 MED ORDER — QUETIAPINE FUMARATE 25 MG PO TABS
50.0000 mg | ORAL_TABLET | Freq: Two times a day (BID) | ORAL | Status: DC
  Administered 2021-03-30 – 2021-03-31 (×2): 50 mg via ORAL
  Filled 2021-03-30 (×2): qty 2

## 2021-03-30 MED ORDER — ACETAMINOPHEN 325 MG PO TABS: 650.0000 mg | ORAL_TABLET | ORAL | Status: DC | PRN

## 2021-03-30 MED ORDER — ACETAMINOPHEN 160 MG/5ML PO SOLN: 650.0000 mg | ORAL | Status: DC | PRN

## 2021-03-30 MED ORDER — HALOPERIDOL LACTATE 5 MG/ML IJ SOLN
5.0000 mg | Freq: Four times a day (QID) | INTRAMUSCULAR | Status: DC | PRN
  Administered 2021-03-30: 5 mg via INTRAVENOUS

## 2021-03-30 NOTE — Progress Notes (Signed)
OT Cancellation Note  Patient Details Name: [name] MRN: [id number] DOB: 1941-04-13   Cancelled Treatment:    Reason Eval/Treat Not Completed: Patient at procedure or test/ unavailable. Consult received, chart reviewed. Pt stretcher not present upon OT's attempt. Will re-attempt OT evaluation at later date/time as pt is available and medically appropriate.   Hanley Hays, MPH, MS, OTR/L ascom [phone] 03/30/21, 2:54 PM

## 2021-03-30 NOTE — Progress Notes (Signed)
Ministry of presence, prayer,

## 2021-03-30 NOTE — TOC Progression Note (Signed)
Transition of Care [facility]) - Progression Note    Patient Details  Name: [name] MRN: [id number] Date of Birth: Mar 26, 1941  Transition of Care [facility] CM/SW [place], RN Phone Number: 03/30/2021, 4:18 PM  Clinical Narrative:   Daughter in law at bedside, states she and son are attempting to get POA, as they would like to assume her care.  They are unsure if she gets medications or gets to appointments, they are taking her to see neurologist.  Patient alert to self only, constantly pulling at tubes and trying to get out of bed, sitter at bedside.    Son contacted [facility], states he would like to consider SNF and will let me know over the weekend.  TOC contact information given, TOC to follow through discharge.         Expected Discharge Plan and Services                                                 Social Determinants of Health (SDOH) Interventions    Readmission Risk Interventions No flowsheet data found.

## 2021-03-30 NOTE — Progress Notes (Signed)
PROGRESS NOTE    [name]  GBT:[id number] DOB: 1941-09-08 DOA: 03/29/2021 PCP: [name], DO    Brief Narrative:  80 year old female with mild dementia, atrial fibrillation who has not been compliant with anticoagulation, admitted to the hospital with acute change in mental status.  MRI brain confirmed acute CVA.  She is being admitted for further work-up.   Assessment & Plan:   Active Problems:   HTN (hypertension)   Atrial fibrillation (HCC)   Chronic venous insufficiency   Delirium   Cellulitis   Acute CVA (cerebrovascular accident) ([place])   Acute CVA.  Patient was admitted with acute change in mental status.  She was increasingly confused.  Urinalysis did not show any signs of infection.  MRI of the brain performed which showed evidence of embolic infarcts.  Per records, she had not been compliant with anticoagulation.  Per stroke pathway, will check MRI head, echocardiogram and carotid Dopplers.  Check lipid panel and A1c.  PT/OT consultations have been requested.  She has been continued on Xarelto.  Acute delirium.  Metabolic work-up has been unrevealing.  She does not have any signs of UTI.  Suspect that this may be related to acute CVA.  Overall mental status appears to be slowly improving.  Continue to monitor.  Permanent atrial fibrillation.  Continue rate control with metoprolol.  Heart rate is currently stable.  Continue anticoagulation with Xarelto.  Hypertension.  Blood pressure mildly elevated, will continue metoprolol.  Avoid aggressive blood pressure control in the setting of acute stroke.  Chronic venous stasis bilaterally with possible underlying cellulitis.  Started on Ancef.  Keep lower extremities elevated.  Will likely transition to Augmentin as she clinically improves.  Mild dementia.  Patient has outpatient evaluation scheduled with neurology on 6/8  DVT prophylaxis:  rivaroxaban (XARELTO) tablet 20 mg  Code Status: Full code Family  Communication:updated daughter Izora Gala Allston over the phone Disposition Plan: Status is: Observation  The patient remains OBS appropriate and will d/c before 2 midnights.  Dispo: The patient is from: Home              Anticipated d/c is to: TBD              Patient currently is not medically stable to d/c.   Difficult to place patient No   Consultants:     Procedures:     Antimicrobials:   Ancef 5/26>   Subjective: Appears to be more awake and alert today.  She does know she is in the hospital.  She does complain of swelling and redness in her legs which appears to be a chronic issue  Objective: Vitals:   03/30/21 0203 03/30/21 0453 03/30/21 0634 03/30/21 0800  BP: (!) 183/88 (!) 139/59 (!) 147/70 (!) 159/78  Pulse: 63 64 68 (!) 56  Resp: 20 18 18    Temp:      TempSrc:      SpO2: 98% 99% 98%   Weight:      Height:       No intake or output data in the 24 hours ending 03/30/21 1029 Filed Weights   03/29/21 1119  Weight: 101 kg    Examination:  General exam: Appears calm and comfortable  Respiratory system: Clear to auscultation. Respiratory effort normal. Cardiovascular system: S1 & S2 heard, RRR. No JVD, murmurs, rubs, gallops or clicks.  Gastrointestinal system: Abdomen is nondistended, soft and nontender. No organomegaly or masses felt. Normal bowel sounds heard. Central nervous system: Alert and  oriented. No focal neurological deficits. Extremities: Bilateral lower extremity edema with erythema Skin: No rashes, lesions or ulcers Psychiatry: Cooperative, pleasant, oriented to place    Data Reviewed: I have personally reviewed following labs and imaging studies  CBC: Recent Labs  Lab 03/29/21 1159 03/30/21 0515  WBC 7.9 6.6  NEUTROABS 5.6  --   HGB 13.7 12.5  HCT 41.1 37.4  MCV 90.1 90.1  PLT 348 269   Basic Metabolic Panel: Recent Labs  Lab 03/29/21 1159 03/30/21 0515  NA 142 138  K 4.6 3.9  CL 108 106  CO2 22 24  GLUCOSE 92 82  BUN 19  15  CREATININE 0.80 0.73  CALCIUM 9.2 8.7*   GFR: Estimated Creatinine Clearance: 73.4 mL/min (by C-G formula based on SCr of 0.73 mg/dL). Liver Function Tests: Recent Labs  Lab 03/29/21 1159 03/30/21 0515  AST 30 23  ALT 14 15  ALKPHOS 72 63  BILITOT 1.2 1.3*  PROT 7.5 6.3*  ALBUMIN 4.0 3.3*   No results for input(s): LIPASE, AMYLASE in the last 168 hours. Recent Labs  Lab 03/29/21 2001  AMMONIA 14   Coagulation Profile: Recent Labs  Lab 03/29/21 1159  INR 1.0   Cardiac Enzymes: No results for input(s): CKTOTAL, CKMB, CKMBINDEX, TROPONINI in the last 168 hours. BNP (last 3 results) No results for input(s): PROBNP in the last 8760 hours. HbA1C: No results for input(s): HGBA1C in the last 72 hours. CBG: Recent Labs  Lab 03/29/21 1119  GLUCAP 80   Lipid Profile: Recent Labs    03/30/21 0515  CHOL 133  HDL 50  LDLCALC 73  TRIG 50  CHOLHDL 2.7   Thyroid Function Tests: Recent Labs    03/29/21 2001  TSH 1.027   Anemia Panel: Recent Labs    03/29/21 2001  VITAMINB12 384   Sepsis Labs: No results for input(s): PROCALCITON, LATICACIDVEN in the last 168 hours.  Recent Results (from the past 240 hour(s))  Resp Panel by RT-PCR (Flu A&B, Covid)     Status: None   Collection Time: 03/29/21  6:02 PM   Specimen: Nasopharyngeal(NP) swabs in vial transport medium  Result Value Ref Range Status   SARS Coronavirus 2 by RT PCR NEGATIVE NEGATIVE Final    Comment: (NOTE) SARS-CoV-2 target nucleic acids are NOT DETECTED.  The SARS-CoV-2 RNA is generally detectable in upper respiratory specimens during the acute phase of infection. The lowest concentration of SARS-CoV-2 viral copies this assay can detect is 138 copies/mL. A negative result does not preclude SARS-Cov-2 infection and should not be used as the sole basis for treatment or other patient management decisions. A negative result may occur with  improper specimen collection/handling, submission of  specimen other than nasopharyngeal swab, presence of viral mutation(s) within the areas targeted by this assay, and inadequate number of viral copies(<138 copies/mL). A negative result must be combined with clinical observations, patient history, and epidemiological information. The expected result is Negative.  Fact Sheet for Patients:  EntrepreneurPulse.com.au  Fact Sheet for Healthcare Providers:  IncredibleEmployment.be  This test is no t yet approved or cleared by the [place] FDA and  has been authorized for detection and/or diagnosis of SARS-CoV-2 by FDA under an Emergency Use Authorization (EUA). This EUA will remain  in effect (meaning this test can be used) for the duration of the COVID-19 declaration under Section 564(b)(1) of the Act, 21 U.S.C.section 360bbb-3(b)(1), unless the authorization is terminated  or revoked sooner.  Influenza A by PCR NEGATIVE NEGATIVE Final   Influenza B by PCR NEGATIVE NEGATIVE Final    Comment: (NOTE) The Xpert Xpress SARS-CoV-2/FLU/RSV plus assay is intended as an aid in the diagnosis of influenza from Nasopharyngeal swab specimens and should not be used as a sole basis for treatment. Nasal washings and aspirates are unacceptable for Xpert Xpress SARS-CoV-2/FLU/RSV testing.  Fact Sheet for Patients: EntrepreneurPulse.com.au  Fact Sheet for Healthcare Providers: IncredibleEmployment.be  This test is not yet approved or cleared by the [place] FDA and has been authorized for detection and/or diagnosis of SARS-CoV-2 by FDA under an Emergency Use Authorization (EUA). This EUA will remain in effect (meaning this test can be used) for the duration of the COVID-19 declaration under Section 564(b)(1) of the Act, 21 U.S.C. section 360bbb-3(b)(1), unless the authorization is terminated or revoked.  Performed at [facility], [address], [place], [place] [address]          Radiology Studies: CT HEAD WO CONTRAST  Result Date: 03/29/2021 CLINICAL DATA:  Altered mental status. EXAM: CT HEAD WITHOUT CONTRAST TECHNIQUE: Contiguous axial images were obtained from the base of the skull through the vertex without intravenous contrast. COMPARISON:  None. FINDINGS: Brain: Old left frontal and occipital infarctions are noted. No mass effect or midline shift is noted. Ventricular size is within normal limits. There is no evidence of mass lesion, hemorrhage or acute infarction. Vascular: No hyperdense vessel or unexpected calcification. Skull: Normal. Negative for fracture or focal lesion. Sinuses/Orbits: No acute finding. Other: None. IMPRESSION: No acute intracranial abnormality seen. Electronically Signed   By: Marijo Conception M.D.   On: 03/29/2021 12:01   MR BRAIN WO CONTRAST  Result Date: 03/30/2021 CLINICAL DATA:  Mental status changes. New and worsening confusion. Atrial fibrillation. Chronic vascular disease. EXAM: MRI HEAD WITHOUT CONTRAST TECHNIQUE: Multiplanar, multiecho pulse sequences of the brain and surrounding structures were obtained without intravenous contrast. COMPARISON:  Head CT yesterday. FINDINGS: Brain: Diffusion imaging shows an 8 mm acute infarction affecting the cortical surface of the deep insula on the left. Second subcentimeter acute cortical infarction in the left parietal lobe. Findings suggest embolic disease in the left carotid circulation. No abnormality is seen affecting the brainstem. Old small vessel cerebellar infarction on the right. Cerebral hemispheres elsewhere show old cortical and subcortical infarction in the left occipital lobe and left frontal lobe and extensive chronic small-vessel ischemic changes of the hemispheric white matter. No sign mass, acute hemorrhage, hydrocephalus or extra-axial collection. Vascular: Major vessels at the base of the brain show flow. Skull and upper cervical spine:  Negative Sinuses/Orbits: Mucosal thickening of the left maxillary sinus. Orbits negative. Other: None IMPRESSION: Two acute subcentimeter infarction is, affecting the deep insula on the left in the left parietal cortex. 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 No large vessel infarction. Old cortical infarctions in the left occipital lobe and left frontal lobe. Chronic small-vessel ischemic changes elsewhere, most prominently affecting the hemispheric white matter as outlined. Electronically Signed   By: Nelson Chimes M.D.   On: 03/30/2021 08:58   DG Chest Portable 1 View  Result Date: 03/29/2021 CLINICAL DATA:  Altered mental status EXAM: PORTABLE CHEST 1 VIEW COMPARISON:  None. FINDINGS: Cardiac shadow is enlarged. Aortic calcifications are noted. Lungs are well aerated bilaterally without focal infiltrate. Mild vascular prominence is noted without edema. No bony abnormality is seen. IMPRESSION: No acute abnormality noted. Electronically Signed   By: Linus Mako.D.  On: 03/29/2021 12:50        Scheduled Meds: .  stroke: mapping our early stages of recovery book   Does not apply Once  . metoprolol succinate  50 mg Oral Q breakfast  . rivaroxaban  20 mg Oral Daily  . sodium chloride flush  3 mL Intravenous Once   Continuous Infusions: .  ceFAZolin (ANCEF) IV Stopped (03/30/21 0657)     LOS: 0 days    Time spent: 45mins    Kathie Dike, MD [facility]   If 7PM-7AM, please contact night-coverage [email or web address]  03/30/2021, 10:29 AM

## 2021-03-30 NOTE — Evaluation (Signed)
Physical Therapy Evaluation Patient Details Name: [name] MRN: [id number] DOB: July 05, 1941 Today's Date: 03/30/2021   History of Present Illness  [name] is a 21yoF who [facility] with AMS. PMH: AF on BB and xarelto, venous stasis, HTN, memory impairment, cat bite cellulitis (2016). Brain MRI revealing of 8 mm acute infarction affecting the cortical surface of the deep insula on the left. Second subcentimeter acute cortical infarction in the left parietal lobe. Remote Lt occipital and Left frontal lobe infarcts.  Clinical Impression  Pt admitted with above diagnosis. Pt currently with functional limitations due to the deficits listed below (see "PT Problem List"). Upon entry, pt in gurney in ED hall, awake and agreeable to participate. The pt is alert, pleasant, interactive, but clearly confused, unable to provide much background, claims to be in a [place] at present. Pt is unable to provide info regarding prior level of function. Pt able to move four limbs without any unexpected difficulty, can perform ankle ROM bilat, SLR bilat. Pt willing to attempt mobility to EOB, moderate effort with HOB elevated, unable to stand despite maxA, somewhat impulsive due to pain in legs. Bilat erythema and crusty wounds, no significant drainage. Session ended at radiology comes to take pt to MRI. Patient's performance this date reveals decreased ability, independence, and tolerance in performing all basic mobility required for performance of activities of daily living. Pt requires additional DME, close physical assistance, and cues for safe participate in mobility. Pt will benefit from skilled PT intervention to increase independence and safety with basic mobility in preparation for discharge to the venue listed below.       Follow Up Recommendations SNF;Supervision for mobility/OOB    Equipment Recommendations  None recommended by PT    Recommendations for Other Services       Precautions /  Restrictions Precautions Precautions: Fall Restrictions Weight Bearing Restrictions: No      Mobility  Bed Mobility Overal bed mobility: Needs Assistance Bed Mobility: Supine to Sit;Sit to Supine     Supine to sit: Min assist;HOB elevated Sit to supine: Max assist   General bed mobility comments: not following cues for return to supine, AMS    Transfers Overall transfer level: Needs assistance Equipment used: None;Rolling walker (2 wheeled) Transfers: Sit to/from Stand Sit to Stand: Max assist;+2 physical assistance         General transfer comment: pain in knees limiting motivation, gets anxious and impulsive, not following cues for body positiong, ultimately never able to fully rise despite several attempts.  Ambulation/Gait                Stairs            Wheelchair Mobility    Modified Rankin (Stroke Patients Only)       Balance                                             Pertinent Vitals/Pain Pain Assessment: Faces Faces Pain Scale: Hurts even more Pain Location: knees hurt when attempting standing; Rt ankle pain when sock is pulled down and scabs come off Pain Intervention(s): Limited activity within patient's tolerance;Monitored during session;Premedicated before session    Home Living                   Additional Comments: Pt reports to live at home with her  mother and brother, very confused. Will need to get detail from family if possible.    Prior Function                 Hand Dominance        Extremity/Trunk Assessment   Upper Extremity Assessment Upper Extremity Assessment:  (no apparent asymmetry, testing only mildly limited by cognition;)    Lower Extremity Assessment Lower Extremity Assessment: Generalized weakness (bilat erythema from mid calf to feet; wounds are sticking to sock, scabs ripped off when author pulls socks down to assess)       Communication      Cognition  Arousal/Alertness: Awake/alert   Overall Cognitive Status: Impaired/Different from baseline Area of Impairment: Orientation                 Orientation Level: Person (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, agricultural")                    General Comments      Exercises     Assessment/Plan    PT Assessment Patient needs continued PT services  PT Problem List Decreased strength;Decreased activity tolerance;Decreased range of motion;Decreased mobility;Decreased balance;Decreased knowledge of use of DME       PT Treatment Interventions DME instruction;Balance training;Gait training;Stair training;Functional mobility training;Therapeutic activities;Therapeutic exercise;Patient/family education;Neuromuscular re-education;Cognitive remediation    PT Goals (Current goals can be found in the Care Plan section)  Acute Rehab PT Goals PT Goal Formulation: Patient unable to participate in goal setting    Frequency 7X/week   Barriers to discharge        Co-evaluation               AM-PAC PT "6 Clicks" Mobility  Outcome Measure Help needed turning from your back to your side while in a flat bed without using bedrails?: Total Help needed moving from lying on your back to sitting on the side of a flat bed without using bedrails?: Total Help needed moving to and from a bed to a chair (including a wheelchair)?: Total Help needed standing up from a chair using your arms (e.g., wheelchair or bedside chair)?: Total Help needed to walk in hospital room?: Total Help needed climbing 3-5 steps with a railing? : Total 6 Click Score: 6    End of Session   Activity Tolerance: Patient limited by pain Patient left: in bed;with nursing/sitter in room Nurse Communication: Mobility status PT Visit Diagnosis: Unsteadiness on feet (R26.81);Difficulty in walking, not elsewhere classified (R26.2);Muscle weakness (generalized) (M62.81)    Time: 7893-8101 PT Time Calculation (min) (ACUTE  ONLY): 8 min   Charges:   PT Evaluation $PT Eval Moderate Complexity: 1 Mod          12:13 PM, 03/30/21 Etta Grandchild, PT, DPT Physical Therapist - [facility]  [phone] ([place])    Milton C 03/30/2021, 12:06 PM

## 2021-03-30 NOTE — ED Notes (Signed)
Family updated as to patient's status.

## 2021-03-30 NOTE — Progress Notes (Signed)
Pt arrived to 1C bed 128 at this time for continued medical treatment.  Pt is oriented to self only on room air and does not follow direction.  Pt has stated multiple times she is in a school and needs to "go get those things" even immediately after being reoriented. Sitter is now at bedside for concern for patient safety.

## 2021-03-30 NOTE — ED Notes (Signed)
Informed RN bed assigned 

## 2021-03-31 ENCOUNTER — Inpatient Hospital Stay: Payer: Medicare HMO

## 2021-03-31 ENCOUNTER — Inpatient Hospital Stay (HOSPITAL_COMMUNITY)
Admit: 2021-03-31 | Discharge: 2021-03-31 | Disposition: A | Discharge status: Home / Self Care | Payer: Medicare HMO | Attending: Internal Medicine | Admitting: Internal Medicine

## 2021-03-31 DIAGNOSIS — R410 Disorientation, unspecified: Secondary | ICD-10-CM

## 2021-03-31 DIAGNOSIS — I639 Cerebral infarction, unspecified: Secondary | ICD-10-CM

## 2021-03-31 DIAGNOSIS — I6389 Other cerebral infarction: Secondary | ICD-10-CM

## 2021-03-31 LAB — LACTIC ACID, PLASMA
Lactic Acid, Venous: 1.6 mmol/L (ref 0.5–1.9)
Lactic Acid, Venous: 2.5 mmol/L (ref 0.5–1.9)
Lactic Acid, Venous: 1.8 mmol/L (ref 0.5–1.9)
Lactic Acid, Venous: 1.4 mmol/L (ref 0.5–1.9)

## 2021-03-31 LAB — CBC
HCT: 40.4 % (ref 36.0–46.0)
Hemoglobin: 13.8 g/dL (ref 12.0–15.0)
MCH: 30.1 pg (ref 26.0–34.0)
MCHC: 34.2 g/dL (ref 30.0–36.0)
MCV: 88.2 fL (ref 80.0–100.0)
Platelets: 323 10*3/uL (ref 150–400)
RBC: 4.58 MIL/uL (ref 3.87–5.11)
RDW: 13.1 % (ref 11.5–15.5)
WBC: 11.5 10*3/uL — ABNORMAL HIGH (ref 4.0–10.5)
nRBC: 0 % (ref 0.0–0.2)

## 2021-03-31 LAB — COMPREHENSIVE METABOLIC PANEL
ALT: 11 U/L (ref 0–44)
AST: 24 U/L (ref 15–41)
Albumin: 3.5 g/dL (ref 3.5–5.0)
Alkaline Phosphatase: 62 U/L (ref 38–126)
Anion gap: 10 (ref 5–15)
BUN: 19 mg/dL (ref 8–23)
CO2: 25 mmol/L (ref 22–32)
Calcium: 8.7 mg/dL — ABNORMAL LOW (ref 8.9–10.3)
Chloride: 103 mmol/L (ref 98–111)
Creatinine, Ser: 0.94 mg/dL (ref 0.44–1.00)
GFR, Estimated: >60 mL/min (ref >60)
Glucose, Bld: 131 mg/dL — ABNORMAL HIGH (ref 70–99)
Potassium: 3.9 mmol/L (ref 3.5–5.1)
Sodium: 138 mmol/L (ref 135–145)
Total Bilirubin: 1.5 mg/dL — ABNORMAL HIGH (ref 0.3–1.2)
Total Protein: 7.1 g/dL (ref 6.5–8.1)

## 2021-03-31 LAB — ECHOCARDIOGRAM COMPLETE
AR max vel: 1.5 cm2
AV Peak grad: 17.8 mmHg
Ao pk vel: 2.11 m/s
Area-P 1/2: 2.99 cm2
Height: 70 in
MV VTI: 1.37 cm2
S' Lateral: 2.78 cm
Weight: 3562.63 oz

## 2021-03-31 LAB — MRSA PCR SCREENING: MRSA by PCR: POSITIVE — AB

## 2021-03-31 LAB — GLUCOSE, CAPILLARY: Glucose-Capillary: 124 mg/dL — ABNORMAL HIGH (ref 70–99)

## 2021-03-31 LAB — PROCALCITONIN: Procalcitonin: 0.26 ng/mL

## 2021-03-31 LAB — CK: Total CK: 238 U/L — ABNORMAL HIGH (ref 38–234)

## 2021-03-31 IMAGING — DX DG CHEST 1V PORT
1 series · 1 of 1 positions shown · non-contrast
Comparison: [DATE].

CLINICAL DATA: 79-year-old female with fever and altered mental
status.

EXAM:
PORTABLE CHEST - 1 VIEW

[chest ap]
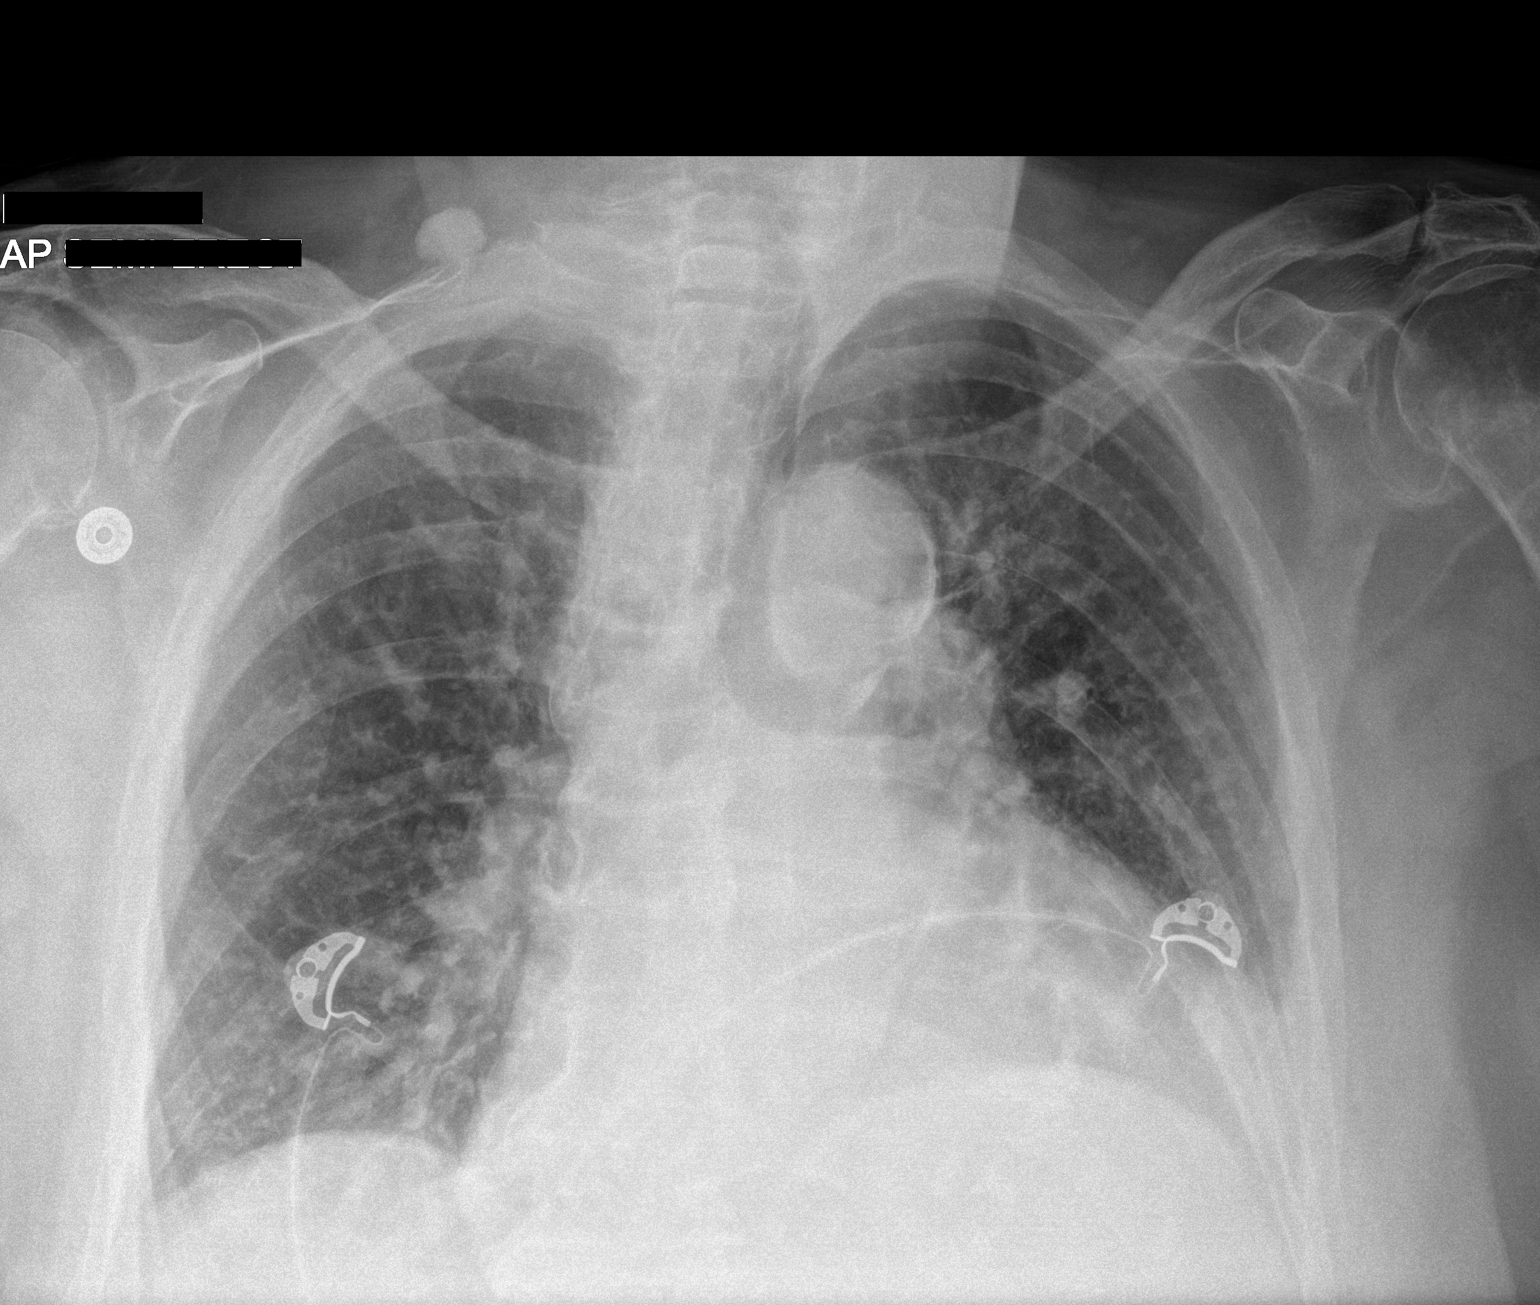

[1 of 1 positions shown; findings below may reference images not displayed]

FINDINGS: The patient is rotated to the left. Low lung volumes. The
mediastinal contours are within normal limits. Stable cardiomegaly.
Mild bibasilar subsegmental atelectasis. The lungs are otherwise
clear bilaterally without evidence of focal consolidation, pleural
effusion, or pneumothorax. Atherosclerotic calcifications of the
aortic arch. No acute osseous abnormality.
IMPRESSION: Bibasilar subsegmental atelectasis. Stable cardiomegaly. Aortic
Atherosclerosis ([5L]-[5L]).

## 2021-03-31 MED ORDER — SODIUM CHLORIDE 0.9 % IV BOLUS
500.0000 mL | Freq: Once | INTRAVENOUS | Status: AC
  Administered 2021-04-01: 500 mL via INTRAVENOUS

## 2021-03-31 MED ORDER — MUPIROCIN 2 % EX OINT
1.0000 "application " | TOPICAL_OINTMENT | Freq: Two times a day (BID) | CUTANEOUS | Status: AC
  Administered 2021-03-31 – 2021-04-05 (×10): 1 via NASAL
  Filled 2021-03-31 (×2): qty 22

## 2021-03-31 MED ORDER — CHLORHEXIDINE GLUCONATE CLOTH 2 % EX PADS
6.0000 | MEDICATED_PAD | Freq: Every day | CUTANEOUS | Status: DC
  Administered 2021-03-31: 6 via TOPICAL

## 2021-03-31 MED ORDER — CHLORHEXIDINE GLUCONATE CLOTH 2 % EX PADS
6.0000 | MEDICATED_PAD | Freq: Every day | CUTANEOUS | Status: DC
  Administered 2021-04-01 – 2021-04-04 (×4): 6 via TOPICAL

## 2021-03-31 MED ORDER — LORAZEPAM 2 MG/ML IJ SOLN
1.0000 mg | INTRAMUSCULAR | Status: DC | PRN
  Administered 2021-03-31 – 2021-04-05 (×12): 1 mg via INTRAVENOUS
  Filled 2021-03-31 (×13): qty 1

## 2021-03-31 MED ORDER — SODIUM CHLORIDE 0.9 % IV SOLN: INTRAVENOUS | Status: DC

## 2021-03-31 MED ORDER — KETOROLAC TROMETHAMINE 15 MG/ML IJ SOLN
15.0000 mg | Freq: Once | INTRAMUSCULAR | Status: AC
  Administered 2021-03-31: 15 mg via INTRAVENOUS
  Filled 2021-03-31: qty 1

## 2021-03-31 NOTE — Progress Notes (Signed)
OT Cancellation Note  Patient Details Name: [name] MRN: [id number] DOB: Nov 23, 1940   Cancelled Treatment:    Reason Eval/Treat Not Completed: Medical issues which prohibited therapy  Pt with MEWs of 4 earlier this date d/t fever and increased RR. Pt ultimately required t/f to ICU. Per protocol, d/t transfer to higher level of care, will complete initial OT order. Will require new order to resume services should pt be appropriate to participate.   Gerrianne Scale, [place], OTR/L ascom [phone] 03/31/21, 5:13 PM

## 2021-03-31 NOTE — Progress Notes (Addendum)
Around 1055 noted pt anxious stated she is falling from the bed, temp 100.6, HR 107.  MEWS 2. Ativan given for anxiety and tylenol for fever.  On reassessment pt seemed to calm down, but noted temp 101.8 and RR 26 (MEWS 4).  Rapid response was activated.  Dr. Roderic Palau has been notified and place orders.    Axillary temp 100.6 post toradol.  MEWS 3.  Will continue to monitor.    Pt's temp improved, but patient continued to be restless, tachypnea.  BP elevated, pt was holding on the bed side rails at that time.  Son Pilar Plate and Dr. Roderic Palau at the bedside.  Per Dr Roderic Palau neuro consult pending and pt will be transfer to ICU.

## 2021-03-31 NOTE — Significant Event (Signed)
Called RRT per protocol for red mews score. Temp 101.8 with increased respirations driving mews. MD notified by RN Cherlyn Labella, Tylenol given, Room temp decreased, and large blanket removed.  [place] to call if further assistance needed.

## 2021-03-31 NOTE — Progress Notes (Signed)
PT Cancellation Note  Patient Details Name: [name] MRN: [id number] DOB: 28-Jul-1941   Cancelled Treatment:     Spoke with nursing who had just increased MEWS to 4 due to pt being febrile with increased RR.  Pt unable to tolerate skilled PT services at this time.  Will reassess for appropriate needs next available date.    [name] 03/31/2021, 12:50 PM

## 2021-03-31 NOTE — Consult Note (Addendum)
NEUROLOGY CONSULTATION NOTE   Date of service: Mar 31, 2021 Patient Name: [name] MRN:  [id number] DOB:  [birth year] Reason for consult: hyperthermia, rigidity in setting of antipsychotic exposure _ _ _   _ __   _ __ _ _  __ __   _ __   __ _  History of Present Illness   [name] a 80 y.o.femalewith medical history significant ofatrial fibrillation not compliant with AC, chronic venous stasis, hypertension, mild dementia sent to ED from PCP's office for AMS 5/26 and admitted for further w/u. She was found to have multifocal L hemispheric subcentimeter infarcts on MRI brain (see stroke w/u below). Due to the small size of the infarcts and high risk of further embolic events she was continued on Xarelto. Her mental status worsened. UA showed no e/o infection, CXR showed no e/o PNA, blood cx x2 pending with NGTD. She is on cefazolin for cellulitis.  Due to agitation and delirium she received 5mg  IV haldol in ED and 5mg  IV haldol yesterday evening. She also received 50mg  Seroquel last night and this morning. Over the course of 4 hours mid-day today she developed hyperthermia (temp >38 deg on 4 separate readings today 1053-1321; Tmax 38.8), sympathetic nervous system lability incl tachypnea (RR 18 --> 26), tachycardia (71 --> 109), SBP fluctuations in excess of 80 mm Hg, worsening mental status, and extreme rigidity. CK was ~ upper threshold of normal at 238. Haldol and seroquel were discontinued and neurology was consulted 2/2 c/f neuroleptic malignant syndrome.  ** Stroke w/u this admission:  MRI brain Two acute subcentimeter infarctions affecting the deep insula on the left in the left parietal cortex. Appearance is embolic (from L carotid or central embolic source). Chronic infarcts L occipital and L frontal lobe. CSVID.  MRA head No proximal intracranial vessel occlusion or significant stenosis.  CNS imaging personally reviewed  Carotid US [place] carotid atherosclerosis. No  hemodynamically significant ICA stenosis. Degree of narrowing less than 50% bilaterally by ultrasound criteria. Patent antegrade vertebral flow bilaterally  TTE 1. Left ventricular ejection fraction, by estimation, is 60 to 65%. The left ventricle has normal function. The left ventricle has no regional wall motion abnormalities. There is mild left ventricular hypertrophy. Left ventricular diastolic parameters are indeterminate. ?2. Right ventricular systolic function is normal. The right ventricular size is normal. There is mildly elevated pulmonary artery systolic pressure. The estimated right ventricular systolic pressure is 81.2 mmHg. ?3. Left atrial size was severely dilated. ?4. Right atrial size was severely dilated. ?5. The mitral valve is normal in structure. Mild mitral valve ?regurgitation.   LDL 73 A1c 5.5   ROS   UTA 2/2 encephalopathy  Past History   Past Medical History:  Diagnosis Date  . Cat bite of right lower leg [id number]  . Cellulitis of lower leg 12/07/2014  . Hypertension    No past surgical history on file. Family History  Problem Relation Age of Onset  . Hypertension Mother   . Alcohol abuse Father   . Heart disease Father   . Hypertension Father   . Hyperlipidemia Sister   . Hypertension Sister   . Alcohol abuse Brother   . Hypertension Brother    Social History   Socioeconomic History  . Marital status: Widowed    Spouse name: Not on file  . Number of children: Not on file  . Years of education: Not on file  . Highest education level: High school graduate  Occupational History  .  Occupation: retired   Tobacco Use  . Smoking status: Former Smoker    Quit date: 11/04/1978    Years since quitting: 42.4  . Smokeless tobacco: Never Used  Vaping Use  . Vaping Use: Never used  Substance and Sexual Activity  . Alcohol use: No  . Drug use: No  . Sexual activity: Never  Other Topics Concern  . Not on file  Social History Narrative  . Not on  file   Social Determinants of Health   Financial Resource Strain: Not on file  Food Insecurity: Not on file  Transportation Needs: Not on file  Physical Activity: Not on file  Stress: Not on file  Social Connections: Not on file   Allergies  Allergen Reactions  . Amlodipine     Leg swelling  . Hctz [Hydrochlorothiazide] Nausea Only and Rash    Medications   Medications Prior to Admission  Medication Sig Dispense Refill Last Dose  . metoprolol succinate (TOPROL-XL) 50 MG 24 hr tablet Take 1 tablet (50 mg total) by mouth daily. Take with or immediately following a meal. 30 tablet 6 unknown at unknown  . sulfamethoxazole-trimethoprim (BACTRIM DS) 800-160 MG tablet Take 1 tablet by mouth 2 (two) times daily. 14 tablet 0 unknown at unknown  . rivaroxaban (XARELTO) 20 MG TABS tablet TAKE 1 TABLET BY MOUTH EVERY DAY WITH DINNER (Patient not taking: Reported on 03/29/2021) 90 tablet 1 Not Taking at Unknown time     Vitals   Vitals:   03/31/21 2230 03/31/21 2245 03/31/21 2300 03/31/21 2315  BP:   134/78   Pulse: 91 77 75 90  Resp: (!) 21 (!) 32 (!) 33 (!) 21  Temp:      TempSrc:      SpO2: 94% (!) 84% 91% 94%  Weight:      Height:         Body mass index is 31.47 kg/m.  Physical Exam   Physical Exam Gen: alert and oriented to self only CV: tachycardic with regular rate Resp: tachypneic but not in distress  Neuro: *MS: alert and oriented to self only, answers some questions appropriately yes/no, follows some but not all simple commands *Speech: mild dysarthria, no definitive aphasia but difficult to assess given AMS *CN: PERRLA, blinks to threat bilat, tracks examiner well with apparent EOMI, sensation intact, face symmetric at rest, hearing intact to voice *Motor:   Normal bulk.  No tremor. Extreme rigidity BUE>BLE. At least anti-gravity in all extremities without clear focal weakness. *Sensory: SILT *Coordination:  UTA 2/2 AMS *Reflexes:  3+ and symmetric throughout  without clonus; toes equiv bilat *Gait: deferred  NIHSS  1a Level of Conscious.: 0 1b LOC Questions: 2 1c LOC Commands: 1 2 Best Gaze: 0 3 Visual: 0 4 Facial Palsy: 0 5a Motor Arm - left: 1 5b Motor Arm - Right: 1 6a Motor Leg - Left: 1 6b Motor Leg - Right: 1 7 Limb Ataxia: 0 8 Sensory: 0 9 Best Language: 0 10 Dysarthria: 1 11 Extinct. and Inatten.: 0  TOTAL: 8 (most points favored to be related to NMS)   Premorbid mRS = 3  Labs   CBC:  Recent Labs  Lab 03/29/21 1159 03/30/21 0515 03/31/21 1305  WBC 7.9 6.6 11.5*  NEUTROABS 5.6  --   --   HGB 13.7 12.5 13.8  HCT 41.1 37.4 40.4  MCV 90.1 90.1 88.2  PLT 348 277 756    Basic Metabolic Panel:  Lab Results  Component Value  Date   NA 138 03/31/2021   K 3.9 03/31/2021   CO2 25 03/31/2021   GLUCOSE 131 (H) 03/31/2021   BUN 19 03/31/2021   CREATININE 0.94 03/31/2021   CALCIUM 8.7 (L) 03/31/2021   GFRNONAA >60 03/31/2021   GFRAA 83 08/30/2019   Lipid Panel:  Lab Results  Component Value Date   LDLCALC 73 03/30/2021   HgbA1c:  Lab Results  Component Value Date   HGBA1C 5.5 03/30/2021   Urine Drug Screen: No results found for: LABOPIA, COCAINSCRNUR, LABBENZ, AMPHETMU, THCU, LABBARB  Alcohol Level No results found for: ETH   Impression   Lucendia A Webster a 80 y.o.femalewith medical history significant ofatrial fibrillation not compliant with AC, chronic venous stasis, hypertension, mild dementia sent to ED from PCP's office for AMS and found to have multifocal subcentimeter embolic L hemispheric infarcts. After receiving multiple doses of antipsychotics for agitation over the past 48 hrs she developed hyperthermia, rigidity, worsening mental status, sympathetic nervous system lability c/w clinical diagnostic criteria for neuroleptic malignant syndrome Lisa Crocker 2011).  Recommendations   # Neuroleptic malignant syndrome - Patient transferred to ICU for intensive monitoring - Antipsychotics  discontinued, do not administer any further dopamine antagonists under any circumstances (no antipsychotics, also avoid antiemetics) - Ativan 1mg  IV q 2 hrs pro rigidity or agitation; monitor respiratory status - Hold dantrolene for now given CK ~ULN - Recheck CK AM - Fluid resuscitation as needed for VS stabilization and adequate urine output  # Multifocal L hemispheric infarcts favored to be 2/2 central embolic source in the setting of a fib not compliant with AC - Stroke w/u complete - Continue Xarelto 20mg  qhs in setting of a fib  Will continue to follow.  This patient is critically ill and at significant risk of neurological worsening, death and care requires constant monitoring of vital signs, hemodynamics,respiratory and cardiac monitoring, neurological assessment, discussion with family, other specialists and medical decision making of high complexity. I spent 90 minutes of neurocritical care time  in the care of  this patient. This was time spent independent of any time provided by nurse practitioner or PA.  Su Monks, MD [facility] [phone]  If 7pm- 7am, please page neurology on call as listed in [place].   ______________________________________________________________________   Tanya Nones, Catarina Hartshorn SN, Cohen A, et al. An International Consensus Study of Neuroleptic Malignant Syndrome Diagnostic Criteria Using the Delphi Method. The Journal of Clinical Psychiatry, Volume 72, Pages 1222-8, 2011.    Thank you for the opportunity to take part in the care of this patient. If you have any further questions, please contact the neurology consultation attending.  Signed,  Su Monks, MD [facility] [phone] If 7pm- 7am, please page neurology on call as listed in [place].

## 2021-03-31 NOTE — Plan of Care (Signed)
  Problem: Education: Goal: Knowledge of secondary prevention will improve Outcome: Not Progressing Goal: Knowledge of patient specific risk factors addressed and post discharge goals established will improve Outcome: Not Progressing Goal: Individualized Educational Video(s) Outcome: Not Progressing   Problem: Education: Goal: Knowledge of General Education information will improve Description: Including pain rating scale, medication(s)/side effects and non-pharmacologic comfort measures Outcome: Not Progressing   Problem: Health Behavior/Discharge Planning: Goal: Ability to manage health-related needs will improve Outcome: Not Progressing   Problem: Clinical Measurements: Goal: Ability to maintain clinical measurements within normal limits will improve Outcome: Not Progressing Goal: Will remain free from infection Outcome: Not Progressing Goal: Diagnostic test results will improve Outcome: Not Progressing Goal: Respiratory complications will improve Outcome: Not Progressing Goal: Cardiovascular complication will be avoided Outcome: Not Progressing   Problem: Activity: Goal: Risk for activity intolerance will decrease Outcome: Not Progressing   Problem: Nutrition: Goal: Adequate nutrition will be maintained Outcome: Not Progressing   Problem: Coping: Goal: Level of anxiety will decrease Outcome: Not Progressing   Problem: Elimination: Goal: Will not experience complications related to bowel motility Outcome: Not Progressing Goal: Will not experience complications related to urinary retention Outcome: Not Progressing   Problem: Pain Managment: Goal: General experience of comfort will improve Outcome: Not Progressing   Problem: Safety: Goal: Ability to remain free from injury will improve Outcome: Not Progressing   Problem: Skin Integrity: Goal: Risk for impaired skin integrity will decrease Outcome: Not Progressing

## 2021-03-31 NOTE — Progress Notes (Signed)
Patient hollering for help and calling for Robert. Thought that she was in the floor and falling into glass. Reorientation attempted unsuccessfully. Ativan given as ordered. PRN.Will continue to monitor.

## 2021-03-31 NOTE — Progress Notes (Addendum)
Patient lactic acid trending up on last lab draw. Repeat ordered by physician. Suggested NPO status based on mentation and ST inability to evaluate. Unable to perform full neuro assessment due to current mental status. Patient only intermittently follows commands. Spoke to patient daughter and son-in-law. Updates given. No concerns voiced. Will continue to monitor.

## 2021-03-31 NOTE — Progress Notes (Signed)
Notified Dr. Eber Jones of critical value: Lactic acid 2.5.  IVF initiated.  No new orders at this time.  Pt is awaiting to be transferred to ICU.

## 2021-03-31 NOTE — Progress Notes (Signed)
Chaplain Maggie responded to RR code. Met daughter in law with patient at the beside. Offered support and empathetic listening. Chaplain available per on call number as needed.

## 2021-03-31 NOTE — Progress Notes (Signed)
SLP Cancellation Note  Patient Details Name: [name] MRN: [id number] DOB: 1940-12-29   Cancelled treatment:       Reason Eval/Treat Not Completed: Medical issues which prohibited therapy;Patient not medically ready;Fatigue/lethargy limiting ability to participate (chart reviewed; NSG consulted). Pt had an increased MEWS score w/ a Rapid Response called. Febrile w/ Ativan given currently.  ST services will Hold on evaluation at this time and f/u on Monday when pt's medical status is stable. NSG agreed.      Orinda Kenner, MS, CCC-SLP Speech Language Pathologist [facility] [phone] [facility] 03/31/2021, 12:59 PM

## 2021-03-31 NOTE — Progress Notes (Signed)
No voids recorded since 1200. Bladder scan performed but only showed 155ml. Will continue to monitor.

## 2021-03-31 NOTE — Progress Notes (Signed)
*  PRELIMINARY RESULTS* Echocardiogram 2D Echocardiogram has been performed.  [name] 03/31/2021, 8:53 AM

## 2021-03-31 NOTE — Progress Notes (Signed)
Pt alert, oriented to self only. Follows commands intermittently. Unable to complete full/modified NIHS due to not following commands. Denies pain. Fluctuates between being drowsy and then restless. Able to grip hands when asked repeatedly. RN asked MD if want in and out for UA and culture, per MD can wait for urine sample when pt voids. Tremors causing VS artifact at times. Will continue to monitor.

## 2021-03-31 NOTE — Progress Notes (Signed)
PROGRESS NOTE    [name]  ZOX:[id number] DOB: 12/19/1940 DOA: 03/29/2021 PCP: [name], DO    Brief Narrative:  80 year old female with mild dementia, atrial fibrillation who has not been compliant with anticoagulation, admitted to the hospital with acute change in mental status.  MRI brain confirmed acute CVA.  She is being admitted for further work-up.   Assessment & Plan:   Active Problems:   HTN (hypertension)   Atrial fibrillation (HCC)   Chronic venous insufficiency   Delirium   Cellulitis   Acute CVA (cerebrovascular accident) ([place])   Acute CVA.  Patient was admitted with acute change in mental status.  She was increasingly confused.  Urinalysis did not show any signs of infection.  MRI of the brain performed which showed evidence of embolic infarcts.  Per records, she had not been compliant with anticoagulation.  Per stroke pathway, MRA head, carotid dopplers and echo were unremarkable. A1c 5.5 and lipid panel shows LDL of 73 . Seen by physical therapy with recommendations for SNF.  She has been continued on Xarelto.  Fever. Etiology is not entirely clear. UA checked admission was unrevealing. Chest xray repeated and did not show any signs of pneumonia. Blood cultures have been sent. Lactic acid normal. Procalcitonin also sent. Since patient has associated mental status worsening, appears to have increased tone/rigidity and recently received haloperidol, I am concerned that she may be developing neurolept malignant syndrome. Haldol/seroquel have been discontinued and she will receive ativan as needed for agitation. Will check CK level. Neurology consulted. Will transfer to stepdown for closer monitoring  Acute delirium.  Metabolic work-up has been unrevealing.  She does not have any signs of UTI.  Initially felt to be related to CVA. Current change in mental status may be related to NMS. Appreciate neurology assistance. Continue to follow.  Permanent atrial  fibrillation.  Continue rate control with metoprolol.  Heart rate is currently stable.  Continue anticoagulation with Xarelto.  Hypertension.  Blood pressure mildly elevated, will continue metoprolol.  Avoid aggressive blood pressure control in the setting of acute stroke.  Chronic venous stasis bilaterally with possible underlying cellulitis.  Started on Ancef.  Keep lower extremities elevated.  Will likely transition to Augmentin as she clinically improves.  Mild dementia.  Patient has outpatient evaluation scheduled with neurology on 6/8  DVT prophylaxis:  rivaroxaban (XARELTO) tablet 20 mg  Code Status: Full code Family Communication:updated son Pilar Plate at the bedside Disposition Plan: Status is: Inpatient  The patient will require care spanning > 2 midnights and should be moved to inpatient because: Altered mental status and Inpatient level of care appropriate due to severity of illness  Dispo: The patient is from: Home              Anticipated d/c is to: SNF              Patient currently is not medically stable to d/c.   Difficult to place patient No   Consultants:     Procedures:     Antimicrobials:   Ancef 5/26>   Subjective: Patient noted to be febrile today. She is diaphoretic. She is increasingly somnolent and confused.   Objective: Vitals:   03/31/21 1255 03/31/21 1321 03/31/21 1415 03/31/21 1457  BP: (!) 106/52 (!) 100/50 (!) 155/122 (!) 180/94  Pulse: 87 85 (!) 101   Resp:  (!) 25 (!) 24   Temp: (!) 101.5 F (38.6 C) (!) 100.6 F (38.1 C) 99.8 F (37.7 C)  99.3 F (37.4 C)  TempSrc: Oral Axillary Axillary Axillary  SpO2: 93% 93% 93% 93%  Weight:      Height:        Intake/Output Summary (Last 24 hours) at 03/31/2021 1530 Last data filed at 03/31/2021 1354 Gross per 24 hour  Intake 796.42 ml  Output --  Net 796.42 ml   Filed Weights   03/29/21 1119  Weight: 101 kg    Examination:  General exam: wakes up to voice, flushed,  diaphoretic Respiratory system: Clear to auscultation. Respiratory effort normal. Cardiovascular system:tachycardic. No murmurs, rubs, gallops. Gastrointestinal system: Abdomen is nondistended, soft and nontender. No organomegaly or masses felt. Normal bowel sounds heard. Central nervous system: increased tone/rigidity in upper and lower extremities, tremulous, myoclonic jerks Extremities: bilateral lower extremity venous stasis dermatitis Skin: No rashes, lesions or ulcers Psychiatry: unable to assess     Data Reviewed: I have personally reviewed following labs and imaging studies  CBC: Recent Labs  Lab 03/29/21 1159 03/30/21 0515 03/31/21 1305  WBC 7.9 6.6 11.5*  NEUTROABS 5.6  --   --   HGB 13.7 12.5 13.8  HCT 41.1 37.4 40.4  MCV 90.1 90.1 88.2  PLT 348 277 165   Basic Metabolic Panel: Recent Labs  Lab 03/29/21 1159 03/30/21 0515 03/31/21 1305  NA 142 138 138  K 4.6 3.9 3.9  CL 108 106 103  CO2 22 24 25   GLUCOSE 92 82 131*  BUN 19 15 19   CREATININE 0.80 0.73 0.94  CALCIUM 9.2 8.7* 8.7*   GFR: Estimated Creatinine Clearance: 62.4 mL/min (by C-G formula based on SCr of 0.94 mg/dL). Liver Function Tests: Recent Labs  Lab 03/29/21 1159 03/30/21 0515 03/31/21 1305  AST 30 23 24   ALT 14 15 11   ALKPHOS 72 63 62  BILITOT 1.2 1.3* 1.5*  PROT 7.5 6.3* 7.1  ALBUMIN 4.0 3.3* 3.5   No results for input(s): LIPASE, AMYLASE in the last 168 hours. Recent Labs  Lab 03/29/21 2001  AMMONIA 14   Coagulation Profile: Recent Labs  Lab 03/29/21 1159  INR 1.0   Cardiac Enzymes: No results for input(s): CKTOTAL, CKMB, CKMBINDEX, TROPONINI in the last 168 hours. BNP (last 3 results) No results for input(s): PROBNP in the last 8760 hours. HbA1C: Recent Labs    03/30/21 0515  HGBA1C 5.5   CBG: Recent Labs  Lab 03/29/21 1119  GLUCAP 80   Lipid Profile: Recent Labs    03/30/21 0515  CHOL 133  HDL 50  LDLCALC 73  TRIG 50  CHOLHDL 2.7   Thyroid  Function Tests: Recent Labs    03/29/21 2001  TSH 1.027   Anemia Panel: Recent Labs    03/29/21 2001  VITAMINB12 384   Sepsis Labs: Recent Labs  Lab 03/31/21 1305  LATICACIDVEN 1.6    Recent Results (from the past 240 hour(s))  Resp Panel by RT-PCR (Flu A&B, Covid)     Status: None   Collection Time: 03/29/21  6:02 PM   Specimen: Nasopharyngeal(NP) swabs in vial transport medium  Result Value Ref Range Status   SARS Coronavirus 2 by RT PCR NEGATIVE NEGATIVE Final    Comment: (NOTE) SARS-CoV-2 target nucleic acids are NOT DETECTED.  The SARS-CoV-2 RNA is generally detectable in upper respiratory specimens during the acute phase of infection. The lowest concentration of SARS-CoV-2 viral copies this assay can detect is 138 copies/mL. A negative result does not preclude SARS-Cov-2 infection and should not be used as the sole basis for  treatment or other patient management decisions. A negative result may occur with  improper specimen collection/handling, submission of specimen other than nasopharyngeal swab, presence of viral mutation(s) within the areas targeted by this assay, and inadequate number of viral copies(<138 copies/mL). A negative result must be combined with clinical observations, patient history, and epidemiological information. The expected result is Negative.  Fact Sheet for Patients:  EntrepreneurPulse.com.au  Fact Sheet for Healthcare Providers:  IncredibleEmployment.be  This test is no t yet approved or cleared by the [place] FDA and  has been authorized for detection and/or diagnosis of SARS-CoV-2 by FDA under an Emergency Use Authorization (EUA). This EUA will remain  in effect (meaning this test can be used) for the duration of the COVID-19 declaration under Section 564(b)(1) of the Act, 21 U.S.C.section 360bbb-3(b)(1), unless the authorization is terminated  or revoked sooner.       Influenza A by PCR  NEGATIVE NEGATIVE Final   Influenza B by PCR NEGATIVE NEGATIVE Final    Comment: (NOTE) The Xpert Xpress SARS-CoV-2/FLU/RSV plus assay is intended as an aid in the diagnosis of influenza from Nasopharyngeal swab specimens and should not be used as a sole basis for treatment. Nasal washings and aspirates are unacceptable for Xpert Xpress SARS-CoV-2/FLU/RSV testing.  Fact Sheet for Patients: EntrepreneurPulse.com.au  Fact Sheet for Healthcare Providers: IncredibleEmployment.be  This test is not yet approved or cleared by the [place] FDA and has been authorized for detection and/or diagnosis of SARS-CoV-2 by FDA under an Emergency Use Authorization (EUA). This EUA will remain in effect (meaning this test can be used) for the duration of the COVID-19 declaration under Section 564(b)(1) of the Act, 21 U.S.C. section 360bbb-3(b)(1), unless the authorization is terminated or revoked.  Performed at [facility], [address] [place], [place] [address]          Radiology Studies: MR ANGIO HEAD WO CONTRAST  Result Date: 03/30/2021 CLINICAL DATA:  Stroke, follow-up EXAM: MRA HEAD WITHOUT CONTRAST TECHNIQUE: Angiographic images of the Circle of Willis were acquired using MRA technique without intravenous contrast. COMPARISON:  No pertinent prior exam. FINDINGS: Anterior circulation: Intracranial internal carotid arteries are patent. Anterior and middle cerebral arteries are patent. Anterior communicating artery is present. Posterior circulation: Intracranial vertebral arteries, basilar artery, and posterior cerebral arteries are patent. A small right posterior communicating artery is present. Other: No aneurysm. IMPRESSION: No proximal intracranial vessel occlusion or significant stenosis. Electronically Signed   By: Macy Mis M.D.   On: 03/30/2021 11:52   MR BRAIN WO CONTRAST  Result Date: 03/30/2021 CLINICAL DATA:  Mental status  changes. New and worsening confusion. Atrial fibrillation. Chronic vascular disease. EXAM: MRI HEAD WITHOUT CONTRAST TECHNIQUE: Multiplanar, multiecho pulse sequences of the brain and surrounding structures were obtained without intravenous contrast. COMPARISON:  Head CT yesterday. FINDINGS: Brain: Diffusion imaging shows an 8 mm acute infarction affecting the cortical surface of the deep insula on the left. Second subcentimeter acute cortical infarction in the left parietal lobe. Findings suggest embolic disease in the left carotid circulation. No abnormality is seen affecting the brainstem. Old small vessel cerebellar infarction on the right. Cerebral hemispheres elsewhere show old cortical and subcortical infarction in the left occipital lobe and left frontal lobe and extensive chronic small-vessel ischemic changes of the hemispheric white matter. No sign mass, acute hemorrhage, hydrocephalus or extra-axial collection. Vascular: Major vessels at the base of the brain show flow. Skull and upper cervical spine: Negative Sinuses/Orbits: Mucosal thickening of the left maxillary  sinus. Orbits negative. Other: None IMPRESSION: Two acute subcentimeter infarction is, affecting the deep insula on the left in the left parietal cortex. 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 No large vessel infarction. Old cortical infarctions in the left occipital lobe and left frontal lobe. Chronic small-vessel ischemic changes elsewhere, most prominently affecting the hemispheric white matter as outlined. Electronically Signed   By: Nelson Chimes M.D.   On: 03/30/2021 08:58   US Carotid Bilateral (at [facility] and [facility] only)  Result Date: 03/30/2021 CLINICAL DATA:  Hypertension, small acute strokes by MRI, concern for embolic source EXAM: BILATERAL CAROTID DUPLEX ULTRASOUND TECHNIQUE: Pearline Cables scale imaging, color Doppler and duplex ultrasound were performed of bilateral carotid and vertebral arteries in the neck.  COMPARISON:  03/30/2021 FINDINGS: Criteria: Quantification of carotid stenosis is based on velocity parameters that correlate the residual internal carotid diameter with NASCET-based stenosis levels, using the diameter of the distal internal carotid lumen as the denominator for stenosis measurement. The following velocity measurements were obtained: RIGHT ICA: 61/13 cm/sec CCA: 16/10 cm/sec SYSTOLIC ICA/CCA RATIO:  1.0 ECA: 108 cm/sec LEFT ICA: 94/28 cm/sec CCA: 96/04 cm/sec SYSTOLIC ICA/CCA RATIO:  1.0 ECA: 101 cm/sec RIGHT CAROTID ARTERY: Brion intimal thickening and minimal plaque formation. No hemodynamically significant right ICA stenosis, velocity elevation, or turbulent flow. Degree of narrowing less than 50%. RIGHT VERTEBRAL ARTERY:  Normal antegrade flow LEFT CAROTID ARTERY: Similar Meixner intimal thickening and minimal plaque formation. No hemodynamically significant left ICA stenosis, velocity elevation, or turbulent flow. LEFT VERTEBRAL ARTERY:  Normal antegrade flow IMPRESSION: [place] carotid atherosclerosis. No hemodynamically significant ICA stenosis. Degree of narrowing less than 50% bilaterally by ultrasound criteria. Patent antegrade vertebral flow bilaterally Electronically Signed   By: Jerilynn Mages.  Shick M.D.   On: 03/30/2021 12:14   DG Chest Port 1 View  Result Date: 03/31/2021 CLINICAL DATA:  80 year old female with fever and altered mental status. EXAM: PORTABLE CHEST - 1 VIEW COMPARISON:  03/29/2021. FINDINGS: The patient is rotated to the left. Low lung volumes. The mediastinal contours are within normal limits. Stable cardiomegaly. Mild bibasilar subsegmental atelectasis. The lungs are otherwise clear bilaterally without evidence of focal consolidation, pleural effusion, or pneumothorax. Atherosclerotic calcifications of the aortic arch. No acute osseous abnormality. IMPRESSION: Bibasilar subsegmental atelectasis. Stable cardiomegaly. Aortic Atherosclerosis (ICD10-I70.0). Electronically Signed    By: Ruthann Cancer MD   On: 03/31/2021 13:03   ECHOCARDIOGRAM COMPLETE  Result Date: 03/31/2021    ECHOCARDIOGRAM REPORT   Patient Name:   [facility] Cerniglia Date of Exam: 03/31/2021 Medical Rec #:  [id number]    Height:       70.0 in Accession #:    [id number]   Weight:       222.7 lb Date of Birth:  Jun 29, 1941     BSA:          2.185 m Patient Age:    37 years     BP:           110/51 mmHg Patient Gender: F            HR:           102 bpm. Exam Location:  [facility] Procedure: 2D Echo Indications:     Stroke I63.9  History:         Patient has prior history of Echocardiogram examinations, most                  recent 06/23/2015.  Sonographer:  Kathlen Brunswick RDCS Referring Phys:  [id number] Catharine Kettlewell Diagnosing Phys: Ida Rogue MD  Sonographer Comments: Image acquisition challenging due to uncooperative patient. IMPRESSIONS  1. Left ventricular ejection fraction, by estimation, is 60 to 65%. The left ventricle has normal function. The left ventricle has no regional wall motion abnormalities. There is mild left ventricular hypertrophy. Left ventricular diastolic parameters are indeterminate.  2. Right ventricular systolic function is normal. The right ventricular size is normal. There is mildly elevated pulmonary artery systolic pressure. The estimated right ventricular systolic pressure is 37.8 mmHg.  3. Left atrial size was severely dilated.  4. Right atrial size was severely dilated.  5. The mitral valve is normal in structure. Mild mitral valve regurgitation. FINDINGS  Left Ventricle: Left ventricular ejection fraction, by estimation, is 60 to 65%. The left ventricle has normal function. The left ventricle has no regional wall motion abnormalities. The left ventricular internal cavity size was normal in size. There is  mild left ventricular hypertrophy. Left ventricular diastolic parameters are indeterminate. Right Ventricle: The right ventricular size is normal. No increase in right ventricular wall thickness.  Right ventricular systolic function is normal. There is mildly elevated pulmonary artery systolic pressure. The tricuspid regurgitant velocity is 3.09  m/s, and with an assumed right atrial pressure of 5 mmHg, the estimated right ventricular systolic pressure is 58.8 mmHg. Left Atrium: Left atrial size was severely dilated. Right Atrium: Right atrial size was severely dilated. Pericardium: There is no evidence of pericardial effusion. Mitral Valve: The mitral valve is normal in structure. Moderate mitral annular calcification. Mild mitral valve regurgitation. No evidence of mitral valve stenosis. MV peak gradient, 8.9 mmHg. The mean mitral valve gradient is 4.0 mmHg. Tricuspid Valve: The tricuspid valve is normal in structure. Tricuspid valve regurgitation is mild . No evidence of tricuspid stenosis. Aortic Valve: The aortic valve is normal in structure. Aortic valve regurgitation is not visualized. No aortic stenosis is present. Aortic valve peak gradient measures 17.8 mmHg. Pulmonic Valve: The pulmonic valve was normal in structure. Pulmonic valve regurgitation is not visualized. No evidence of pulmonic stenosis. Aorta: The aortic root is normal in size and structure. Venous: The inferior vena cava is normal in size with greater than 50% respiratory variability, suggesting right atrial pressure of 3 mmHg. IAS/Shunts: No atrial level shunt detected by color flow Doppler.  LEFT VENTRICLE PLAX 2D LVIDd:         4.28 cm  Diastology LVIDs:         2.78 cm  LV e' medial:    7.07 cm/s LV PW:         1.45 cm  LV E/e' medial:  19.7 LV IVS:        1.22 cm  LV e' lateral:   13.80 cm/s LVOT diam:     2.00 cm  LV E/e' lateral: 10.1 LV SV:         39 LV SV Index:   18 LVOT Area:     3.14 cm  RIGHT VENTRICLE RV Basal diam:  2.84 cm RV S prime:     20.00 cm/s TAPSE (M-mode): 1.6 cm LEFT ATRIUM              Index       RIGHT ATRIUM           Index LA diam:        6.20 cm  2.84 cm/m  RA Area:     33.10 cm LA Vol (A2C):  212.0  ml 97.04 ml/m RA Volume:   120.00 ml 54.93 ml/m LA Vol (A4C):   138.0 ml 63.16 ml/m LA Biplane Vol: 182.0 ml 83.30 ml/m  AORTIC VALVE                PULMONIC VALVE AV Area (Vmax): 1.50 cm    PV Vmax:       1.24 m/s AV Vmax:        211.00 cm/s PV Peak grad:  6.2 mmHg AV Peak Grad:   17.8 mmHg LVOT Vmax:      101.00 cm/s LVOT Vmean:     66.300 cm/s LVOT VTI:       0.125 m  AORTA Ao Root diam: 2.70 cm Ao Asc diam:  3.40 cm MITRAL VALVE                TRICUSPID VALVE MV Area (PHT): 2.99 cm     TV Peak grad:   35.0 mmHg MV Area VTI:   1.37 cm     TV Vmax:        2.96 m/s MV Peak grad:  8.9 mmHg     TR Peak grad:   38.2 mmHg MV Mean grad:  4.0 mmHg     TR Vmax:        309.00 cm/s MV Vmax:       1.49 m/s MV Vmean:      91.9 cm/s    SHUNTS MV Decel Time: 254 msec     Systemic VTI:  0.12 m MV E velocity: 139.00 cm/s  Systemic Diam: 2.00 cm Ida Rogue MD Electronically signed by Ida Rogue MD Signature Date/Time: 03/31/2021/12:59:03 PM    Final         Scheduled Meds: .  stroke: mapping our early stages of recovery book   Does not apply Once  . metoprolol succinate  50 mg Oral Q breakfast  . rivaroxaban  20 mg Oral Daily  . sodium chloride flush  3 mL Intravenous Once   Continuous Infusions: . sodium chloride 100 mL/hr at 03/31/21 1524  .  ceFAZolin (ANCEF) IV 2 g (03/31/21 1526)     LOS: 1 day    Time spent: 33mins    Kathie Dike, MD [facility]   If 7PM-7AM, please contact night-coverage [email or web address]  03/31/2021, 3:30 PM

## 2021-04-01 DIAGNOSIS — I6310 Cerebral infarction due to embolism of unspecified precerebral artery: Secondary | ICD-10-CM

## 2021-04-01 DIAGNOSIS — G210 Malignant neuroleptic syndrome: Secondary | ICD-10-CM | POA: Diagnosis not present

## 2021-04-01 LAB — COMPREHENSIVE METABOLIC PANEL
ALT: 11 U/L (ref 0–44)
AST: 24 U/L (ref 15–41)
Albumin: 3.1 g/dL — ABNORMAL LOW (ref 3.5–5.0)
Alkaline Phosphatase: 58 U/L (ref 38–126)
Anion gap: 9 (ref 5–15)
BUN: 22 mg/dL (ref 8–23)
CO2: 25 mmol/L (ref 22–32)
Calcium: 8.4 mg/dL — ABNORMAL LOW (ref 8.9–10.3)
Chloride: 107 mmol/L (ref 98–111)
Creatinine, Ser: 0.83 mg/dL (ref 0.44–1.00)
GFR, Estimated: >60 mL/min (ref >60)
Glucose, Bld: 107 mg/dL — ABNORMAL HIGH (ref 70–99)
Potassium: 3.4 mmol/L — ABNORMAL LOW (ref 3.5–5.1)
Sodium: 141 mmol/L (ref 135–145)
Total Bilirubin: 1.4 mg/dL — ABNORMAL HIGH (ref 0.3–1.2)
Total Protein: 6.7 g/dL (ref 6.5–8.1)

## 2021-04-01 LAB — URINALYSIS, ROUTINE W REFLEX MICROSCOPIC
Bacteria, UA: NONE SEEN
Bilirubin Urine: NEGATIVE
Glucose, UA: NEGATIVE mg/dL
Ketones, ur: 5 mg/dL — AB
Nitrite: NEGATIVE
Protein, ur: 100 mg/dL — AB
Specific Gravity, Urine: 1.04 — ABNORMAL HIGH (ref 1.005–1.030)
pH: 5 (ref 5.0–8.0)

## 2021-04-01 LAB — CK: Total CK: 269 U/L — ABNORMAL HIGH (ref 38–234)

## 2021-04-01 LAB — PROCALCITONIN: Procalcitonin: 0.75 ng/mL

## 2021-04-01 MED ORDER — SODIUM CHLORIDE 0.9 % IV BOLUS
500.0000 mL | Freq: Once | INTRAVENOUS | Status: AC
  Administered 2021-04-01: 500 mL via INTRAVENOUS

## 2021-04-01 MED ORDER — HYDRALAZINE HCL 20 MG/ML IJ SOLN
10.0000 mg | Freq: Four times a day (QID) | INTRAMUSCULAR | Status: DC | PRN
  Administered 2021-04-03 (×3): 10 mg via INTRAVENOUS
  Filled 2021-04-01 (×3): qty 1

## 2021-04-01 MED ORDER — POTASSIUM CHLORIDE CRYS ER 20 MEQ PO TBCR
40.0000 meq | EXTENDED_RELEASE_TABLET | ORAL | Status: AC
  Administered 2021-04-01 (×2): 40 meq via ORAL
  Filled 2021-04-01 (×2): qty 2

## 2021-04-01 NOTE — Progress Notes (Signed)
Patient urine output poor over the last four hours, only 74ml. Bladder scans performed q2 unremarkable. Urine sent for culture. Bolus ordered initiated. Dr. Kennon Holter aware. Will continue to monitor.

## 2021-04-01 NOTE — Progress Notes (Signed)
Patient UOP remains poor. Purewick still intact. 41ml UOP post bolus. Dr. Kennon Holter made aware. Will continue to monitor.

## 2021-04-01 NOTE — Progress Notes (Signed)
Neurology Progress Note  Patient ID: 80 yo woman with mild dementia, a fib not compliant with AC as outpatient presented with AMS and found to have multifocal subcentimeter embolic L hemispheric infarcts. After receiving multiple doses of antipsychotics for agitation, on 03/31/21 she developed hyperthermia, rigidity, worsening mental status, sympathetic nervous system lability c/w clinical diagnostic criteria for neuroleptic malignant syndrome.  Subjective: - Rigidity, mental status, and vital sign instability improved since yesterday - Still intermittent mild fevers; infectious w/u unrevealing  Exam: Vitals:   04/01/21 2200 04/01/21 2259  BP: (!) 148/90   Pulse: 82   Resp: 19 18  Temp: (!) 100.7 F (38.2 C) 99.7 F (37.6 C)  SpO2: 100%    Gen: In bed, NAD Resp: non-labored breathing, no acute distress Abd: soft, nt  Neuro: MS: oriented to self and hospital, states she is feeling better today and is making jokes CN: PERRL, VFF, EOMI, sensation intact, face symmetric, hearing intact to voice Motor: anti-gravity in all extremities but gets confused following instructions for formal strength exam. Rigidity markedly improved since yesterday but not resolved Sensory: SILT DTR: 2+ brisk symmetric throughout, toes equiv bilat  Data Stroke w/u this admission:  MRI brain Two acute subcentimeter infarctions affecting the deep insula on the left in the left parietal cortex. Appearance is embolic (from L carotid or central embolic source). Chronic infarcts L occipital and L frontal lobe. CSVID.  MRA head No proximal intracranial vessel occlusion or significant stenosis.  CNS imaging personally reviewed  Carotid US [place] carotid atherosclerosis. No hemodynamically significant ICA stenosis. Degree of narrowing less than 50% bilaterally by ultrasound criteria. Patent antegrade vertebral flow bilaterally  TTE 1. Left ventricular ejection fraction, by estimation, is 60 to 65%. The  left ventricle has normal function. The left ventricle has no regional wall motion abnormalities. There is mild left ventricular hypertrophy. Left ventricular diastolic parameters are indeterminate. ?2. Right ventricular systolic function is normal. The right ventricular size is normal. There is mildly elevated pulmonary artery systolic pressure. The estimated right ventricular systolic pressure is 16.1 mmHg. ?3. Left atrial size was severely dilated. ?4. Right atrial size was severely dilated. ?5. The mitral valve is normal in structure. Mild mitral valve ?regurgitation.   LDL 73 A1c 5.5  Impression: 80 yo woman with mild dementia, a fib not compliant with AC as outpatient presented with AMS and found to have multifocal subcentimeter embolic L hemispheric infarcts. After receiving multiple doses of antipsychotics for agitation, on 03/31/21 she developed hyperthermia, rigidity, worsening mental status, sympathetic nervous system lability c/w clinical diagnostic criteria for neuroleptic malignant syndrome.  Recommendations:  # Neuroleptic malignant syndrome 5/28 - improved after withdrawal of offending agents - Antipsychotics discontinued, do not administer any further dopamine antagonists under any circumstances (no antipsychotics, also avoid antiemetics) - Ativan 1mg  IV q 2 hrs pro rigidity or agitation; monitor respiratory status - Hold dantrolene for now given CK ~ULN - Recheck CK AM - Fluid resuscitation as needed for VS stabilization and adequate urine output - Reasonable to transfer out of ICU given clinical improvement  # Multifocal L hemispheric infarcts favored to be 2/2 central embolic source in the setting of a fib not compliant with AC - Stroke w/u complete - Continue Xarelto 20mg  qhs in setting of a fib  Will continue to follow.  Su Monks, MD [facility] [phone]  If 7pm- 7am, please page neurology on call as listed in [place].

## 2021-04-01 NOTE — Progress Notes (Signed)
Physical Therapy Discharge Patient Details Name: [name] MRN: [id number] DOB: [birth year] Today's Date: 04/01/2021 Time:  -     Patient discharged from PT services secondary to transfer to ICU per protocol.  Please see latest therapy progress note for current level of functioning and progress toward goals.     Chesley Noon 04/01/2021, 10:31 AM

## 2021-04-01 NOTE — Progress Notes (Signed)
MRSA screen positive, protocol followed. MD made aware. Unable to accurately obtain VSs as patient is restless and has baseline tremor creating motion artifact. Will continue to monitor.

## 2021-04-01 NOTE — Progress Notes (Signed)
PROGRESS NOTE    [name]  JOA:[id number] DOB: 1941-07-30 DOA: 03/29/2021 PCP: [name], DO    Brief Narrative:  80 year old female with mild dementia, atrial fibrillation who has not been compliant with anticoagulation, admitted to the hospital with acute change in mental status.  MRI brain confirmed acute CVA.  She is being admitted for further work-up. Hospital course complicated by development of neuroleptic malignant syndrome after receiving haloperidol for agitation. Overall, her condition is improving and plans are for DC to SNF when improved.   Assessment & Plan:   Active Problems:   HTN (hypertension)   Atrial fibrillation (HCC)   Chronic venous insufficiency   Delirium   Cellulitis   Acute CVA (cerebrovascular accident) ([place])   Neuroleptic malignant syndrome   Acute CVA.  Patient was admitted with acute change in mental status.  She was increasingly confused.  Urinalysis did not show any signs of infection.  MRI of the brain performed which showed evidence of embolic infarcts.  Per records, she had not been compliant with anticoagulation.  Per stroke pathway, MRA head, carotid dopplers and echo were unremarkable. A1c 5.5 and lipid panel shows LDL of 73 . Seen by physical therapy with recommendations for SNF.  She has been continued on Xarelto.  Fever. Likely secondary to neuroleptic malignant syndrome. Appreciate neurology input. Haloperidol discontinued. Overall fever/diaphoresis/rigidity improving. Mental status improving. Infectious work up has been unrevealing. Continue to follow  Acute delirium.  Metabolic work-up has been unrevealing.  She does not have any signs of UTI.  Initially felt to be related to CVA. Current change in mental status may be related to NMS. Appreciate neurology assistance. Mental status imrproving  Permanent atrial fibrillation.  Continue rate control with metoprolol.  Heart rate is currently stable.  Continue anticoagulation with  Xarelto.  Hypertension.  Blood pressure mildly elevated, will continue metoprolol.  Avoid aggressive blood pressure control in the setting of acute stroke.  Chronic venous stasis bilaterally with possible underlying cellulitis.  Started on Ancef.  Keep lower extremities elevated.  Will likely transition to Augmentin as she clinically improves. Erythema improving  Mild dementia.  Patient has outpatient evaluation scheduled with neurology on 6/8  DVT prophylaxis:  rivaroxaban (XARELTO) tablet 20 mg  Code Status: Full code Family Communication:updated daughter in law [name] Zumwalt over the phone Disposition Plan: Status is: Inpatient  The patient will require care spanning > 2 midnights and should be moved to inpatient because: Altered mental status and Inpatient level of care appropriate due to severity of illness  Dispo: The patient is from: Home              Anticipated d/c is to: SNF              Patient currently is not medically stable to d/c.   Difficult to place patient No   Consultants:   Neurology  Procedures:     Antimicrobials:   Ancef 5/26>   Subjective: Wakes up to voice, denies any complaints, diaphoresis/fevers have resolved  Objective: Vitals:   04/01/21 1400 04/01/21 1500 04/01/21 1600 04/01/21 1656  BP: 138/68 137/77 (!) 133/92   Pulse: 72 76 82   Resp: 18 15 (!) 28 (!) 21  Temp:   98.7 F (37.1 C)   TempSrc:   Oral   SpO2: 98% 100% 100%   Weight:      Height:        Intake/Output Summary (Last 24 hours) at 04/01/2021 1725 Last data filed  at 04/01/2021 1303 Gross per 24 hour  Intake 3175.56 ml  Output 550 ml  Net 2625.56 ml   Filed Weights   03/29/21 1119 03/31/21 1659  Weight: 101 kg 99.5 kg    Examination:  General exam: Alert, awake, no distress Respiratory system: Clear to auscultation. Respiratory effort normal. Cardiovascular system:RRR. No murmurs, rubs, gallops. Gastrointestinal system: Abdomen is nondistended, soft and  nontender. No organomegaly or masses felt. Normal bowel sounds heard. Central nervous system: No focal neurological deficits. Extremities: No C/C/E, +pedal pulses Skin: erythema in LE improving Psychiatry: calm, pleasant, confused     Data Reviewed: I have personally reviewed following labs and imaging studies  CBC: Recent Labs  Lab 03/29/21 1159 03/30/21 0515 03/31/21 1305  WBC 7.9 6.6 11.5*  NEUTROABS 5.6  --   --   HGB 13.7 12.5 13.8  HCT 41.1 37.4 40.4  MCV 90.1 90.1 88.2  PLT 348 277 761   Basic Metabolic Panel: Recent Labs  Lab 03/29/21 1159 03/30/21 0515 03/31/21 1305 04/01/21 0413  NA 142 138 138 141  K 4.6 3.9 3.9 3.4*  CL 108 106 103 107  CO2 22 24 25 25   GLUCOSE 92 82 131* 107*  BUN 19 15 19 22   CREATININE 0.80 0.73 0.94 0.83  CALCIUM 9.2 8.7* 8.7* 8.4*   GFR: Estimated Creatinine Clearance: 70.2 mL/min (by C-G formula based on SCr of 0.83 mg/dL). Liver Function Tests: Recent Labs  Lab 03/29/21 1159 03/30/21 0515 03/31/21 1305 04/01/21 0413  AST 30 23 24 24   ALT 14 15 11 11   ALKPHOS 72 63 62 58  BILITOT 1.2 1.3* 1.5* 1.4*  PROT 7.5 6.3* 7.1 6.7  ALBUMIN 4.0 3.3* 3.5 3.1*   No results for input(s): LIPASE, AMYLASE in the last 168 hours. Recent Labs  Lab 03/29/21 2001  AMMONIA 14   Coagulation Profile: Recent Labs  Lab 03/29/21 1159  INR 1.0   Cardiac Enzymes: Recent Labs  Lab 03/31/21 1504 04/01/21 0413  CKTOTAL 238* 269*   BNP (last 3 results) No results for input(s): PROBNP in the last 8760 hours. HbA1C: Recent Labs    03/30/21 0515  HGBA1C 5.5   CBG: Recent Labs  Lab 03/29/21 1119 03/31/21 1655  GLUCAP 80 124*   Lipid Profile: Recent Labs    03/30/21 0515  CHOL 133  HDL 50  LDLCALC 73  TRIG 50  CHOLHDL 2.7   Thyroid Function Tests: Recent Labs    03/29/21 2001  TSH 1.027   Anemia Panel: Recent Labs    03/29/21 2001  VITAMINB12 384   Sepsis Labs: Recent Labs  Lab 03/31/21 1305 03/31/21 1504  03/31/21 2045 03/31/21 2311 04/01/21 0413  PROCALCITON  --  0.26  --   --  0.75  LATICACIDVEN 1.6 2.5* 1.8 1.4  --     Recent Results (from the past 240 hour(s))  Resp Panel by RT-PCR (Flu A&B, Covid)     Status: None   Collection Time: 03/29/21  6:02 PM   Specimen: Nasopharyngeal(NP) swabs in vial transport medium  Result Value Ref Range Status   SARS Coronavirus 2 by RT PCR NEGATIVE NEGATIVE Final    Comment: (NOTE) SARS-CoV-2 target nucleic acids are NOT DETECTED.  The SARS-CoV-2 RNA is generally detectable in upper respiratory specimens during the acute phase of infection. The lowest concentration of SARS-CoV-2 viral copies this assay can detect is 138 copies/mL. A negative result does not preclude SARS-Cov-2 infection and should not be used as the sole basis  for treatment or other patient management decisions. A negative result may occur with  improper specimen collection/handling, submission of specimen other than nasopharyngeal swab, presence of viral mutation(s) within the areas targeted by this assay, and inadequate number of viral copies(<138 copies/mL). A negative result must be combined with clinical observations, patient history, and epidemiological information. The expected result is Negative.  Fact Sheet for Patients:  EntrepreneurPulse.com.au  Fact Sheet for Healthcare Providers:  IncredibleEmployment.be  This test is no t yet approved or cleared by the [place] FDA and  has been authorized for detection and/or diagnosis of SARS-CoV-2 by FDA under an Emergency Use Authorization (EUA). This EUA will remain  in effect (meaning this test can be used) for the duration of the COVID-19 declaration under Section 564(b)(1) of the Act, 21 U.S.C.section 360bbb-3(b)(1), unless the authorization is terminated  or revoked sooner.       Influenza A by PCR NEGATIVE NEGATIVE Final   Influenza B by PCR NEGATIVE NEGATIVE Final     Comment: (NOTE) The Xpert Xpress SARS-CoV-2/FLU/RSV plus assay is intended as an aid in the diagnosis of influenza from Nasopharyngeal swab specimens and should not be used as a sole basis for treatment. Nasal washings and aspirates are unacceptable for Xpert Xpress SARS-CoV-2/FLU/RSV testing.  Fact Sheet for Patients: EntrepreneurPulse.com.au  Fact Sheet for Healthcare Providers: IncredibleEmployment.be  This test is not yet approved or cleared by the [place] FDA and has been authorized for detection and/or diagnosis of SARS-CoV-2 by FDA under an Emergency Use Authorization (EUA). This EUA will remain in effect (meaning this test can be used) for the duration of the COVID-19 declaration under Section 564(b)(1) of the Act, 21 U.S.C. section 360bbb-3(b)(1), unless the authorization is terminated or revoked.  Performed at [facility], [address] [place], [place] [address]   CULTURE, BLOOD (ROUTINE X 2) w Reflex to ID Panel     Status: None (Preliminary result)   Collection Time: 03/31/21  1:05 PM   Specimen: BLOOD  Result Value Ref Range Status   Specimen Description BLOOD LEFT AC  Final   Special Requests   Final    BOTTLES DRAWN AEROBIC AND ANAEROBIC Blood Culture results may not be optimal due to an excessive volume of blood received in culture bottles   Culture   Final    NO GROWTH < 24 HOURS Performed at [facility], [address], [place], [place] [address]    Report Status PENDING  Incomplete  CULTURE, BLOOD (ROUTINE X 2) w Reflex to ID Panel     Status: None (Preliminary result)   Collection Time: 03/31/21  1:15 PM   Specimen: BLOOD  Result Value Ref Range Status   Specimen Description BLOOD RIGHT AC  Final   Special Requests   Final    BOTTLES DRAWN AEROBIC AND ANAEROBIC Blood Culture results may not be optimal due to an excessive volume of blood received in culture bottles   Culture   Final    NO  GROWTH < 24 HOURS Performed at [facility], [address] [place], [place] [address]    Report Status PENDING  Incomplete  MRSA PCR Screening     Status: Abnormal   Collection Time: 03/31/21  5:02 PM   Specimen: Nasopharyngeal  Result Value Ref Range Status   MRSA by PCR POSITIVE (A) NEGATIVE Final    Comment:        The GeneXpert MRSA Assay (FDA approved for NASAL specimens only), is one  component of a comprehensive MRSA colonization surveillance program. It is not intended to diagnose MRSA infection nor to guide or monitor treatment for MRSA infections. RESULT CALLED TO, READ BACK BY AND VERIFIED WITH: KRANCHESICA BELL at [place] on 03/31/2021 by CAF Performed at [facility], [address], [place], [place] [address]          Radiology Studies: [facility] Chest [place] 1 View  Result Date: 03/31/2021 CLINICAL DATA:  80 year old female with fever and altered mental status. EXAM: PORTABLE CHEST - 1 VIEW COMPARISON:  03/29/2021. FINDINGS: The patient is rotated to the left. Low lung volumes. The mediastinal contours are within normal limits. Stable cardiomegaly. Mild bibasilar subsegmental atelectasis. The lungs are otherwise clear bilaterally without evidence of focal consolidation, pleural effusion, or pneumothorax. Atherosclerotic calcifications of the aortic arch. No acute osseous abnormality. IMPRESSION: Bibasilar subsegmental atelectasis. Stable cardiomegaly. Aortic Atherosclerosis (ICD10-I70.0). Electronically Signed   By: Ruthann Cancer MD   On: 03/31/2021 13:03   ECHOCARDIOGRAM COMPLETE  Result Date: 03/31/2021    ECHOCARDIOGRAM REPORT   Patient Name:   [facility] [place] Date of Exam: 03/31/2021 Medical Rec #:  [id number]    Height:       70.0 in Accession #:    [id number]   Weight:       222.7 lb Date of Birth:  09-14-1941     BSA:          2.185 m Patient Age:    43 years     BP:           110/51 mmHg Patient Gender: F            HR:           102 bpm. Exam Location:   [facility] Procedure: 2D Echo Indications:     Stroke I63.9  History:         Patient has prior history of Echocardiogram examinations, most                  recent 06/23/2015.  Sonographer:     Kathlen Brunswick RDCS Referring Phys:  [phone] [facility] Kaylum Shrum Diagnosing Phys: Ida Rogue MD  Sonographer Comments: Image acquisition challenging due to uncooperative patient. IMPRESSIONS  1. Left ventricular ejection fraction, by estimation, is 60 to 65%. The left ventricle has normal function. The left ventricle has no regional wall motion abnormalities. There is mild left ventricular hypertrophy. Left ventricular diastolic parameters are indeterminate.  2. Right ventricular systolic function is normal. The right ventricular size is normal. There is mildly elevated pulmonary artery systolic pressure. The estimated right ventricular systolic pressure is 27.7 mmHg.  3. Left atrial size was severely dilated.  4. Right atrial size was severely dilated.  5. The mitral valve is normal in structure. Mild mitral valve regurgitation. FINDINGS  Left Ventricle: Left ventricular ejection fraction, by estimation, is 60 to 65%. The left ventricle has normal function. The left ventricle has no regional wall motion abnormalities. The left ventricular internal cavity size was normal in size. There is  mild left ventricular hypertrophy. Left ventricular diastolic parameters are indeterminate. Right Ventricle: The right ventricular size is normal. No increase in right ventricular wall thickness. Right ventricular systolic function is normal. There is mildly elevated pulmonary artery systolic pressure. The tricuspid regurgitant velocity is 3.09  m/s, and with an assumed right atrial pressure of 5 mmHg, the estimated right ventricular systolic pressure is 41.2 mmHg. Left Atrium: Left atrial size was severely dilated. Right Atrium: Right atrial  size was severely dilated. Pericardium: There is no evidence of pericardial effusion. Mitral Valve: The  mitral valve is normal in structure. Moderate mitral annular calcification. Mild mitral valve regurgitation. No evidence of mitral valve stenosis. MV peak gradient, 8.9 mmHg. The mean mitral valve gradient is 4.0 mmHg. Tricuspid Valve: The tricuspid valve is normal in structure. Tricuspid valve regurgitation is mild . No evidence of tricuspid stenosis. Aortic Valve: The aortic valve is normal in structure. Aortic valve regurgitation is not visualized. No aortic stenosis is present. Aortic valve peak gradient measures 17.8 mmHg. Pulmonic Valve: The pulmonic valve was normal in structure. Pulmonic valve regurgitation is not visualized. No evidence of pulmonic stenosis. Aorta: The aortic root is normal in size and structure. Venous: The inferior vena cava is normal in size with greater than 50% respiratory variability, suggesting right atrial pressure of 3 mmHg. IAS/Shunts: No atrial level shunt detected by color flow Doppler.  LEFT VENTRICLE PLAX 2D LVIDd:         4.28 cm  Diastology LVIDs:         2.78 cm  LV e' medial:    7.07 cm/s LV PW:         1.45 cm  LV E/e' medial:  19.7 LV IVS:        1.22 cm  LV e' lateral:   13.80 cm/s LVOT diam:     2.00 cm  LV E/e' lateral: 10.1 LV SV:         39 LV SV Index:   18 LVOT Area:     3.14 cm  RIGHT VENTRICLE RV Basal diam:  2.84 cm RV S prime:     20.00 cm/s TAPSE (M-mode): 1.6 cm LEFT ATRIUM              Index       RIGHT ATRIUM           Index LA diam:        6.20 cm  2.84 cm/m  RA Area:     33.10 cm LA Vol (A2C):   212.0 ml 97.04 ml/m RA Volume:   120.00 ml 54.93 ml/m LA Vol (A4C):   138.0 ml 63.16 ml/m LA Biplane Vol: 182.0 ml 83.30 ml/m  AORTIC VALVE                PULMONIC VALVE AV Area (Vmax): 1.50 cm    PV Vmax:       1.24 m/s AV Vmax:        211.00 cm/s PV Peak grad:  6.2 mmHg AV Peak Grad:   17.8 mmHg LVOT Vmax:      101.00 cm/s LVOT Vmean:     66.300 cm/s LVOT VTI:       0.125 m  AORTA Ao Root diam: 2.70 cm Ao Asc diam:  3.40 cm MITRAL VALVE                 TRICUSPID VALVE MV Area (PHT): 2.99 cm     TV Peak grad:   35.0 mmHg MV Area VTI:   1.37 cm     TV Vmax:        2.96 m/s MV Peak grad:  8.9 mmHg     TR Peak grad:   38.2 mmHg MV Mean grad:  4.0 mmHg     TR Vmax:        309.00 cm/s MV Vmax:       1.49 m/s MV Vmean:      91.9  cm/s    SHUNTS MV Decel Time: 254 msec     Systemic VTI:  0.12 m MV E velocity: 139.00 cm/s  Systemic Diam: 2.00 cm Ida Rogue MD Electronically signed by Ida Rogue MD Signature Date/Time: 03/31/2021/12:59:03 PM    Final         Scheduled Meds: .  stroke: mapping our early stages of recovery book   Does not apply Once  . Chlorhexidine Gluconate Cloth  6 each Topical Q0600  . metoprolol succinate  50 mg Oral Q breakfast  . mupirocin ointment  1 application Nasal BID  . potassium chloride  40 mEq Oral Q4H  . rivaroxaban  20 mg Oral Daily  . sodium chloride flush  3 mL Intravenous Once   Continuous Infusions: . sodium chloride 100 mL/hr at 04/01/21 1303  .  ceFAZolin (ANCEF) IV 2 g (04/01/21 1303)     LOS: 2 days    Time spent: 91mins    Kathie Dike, MD [facility]   If 7PM-7AM, please contact night-coverage [email or web address]  04/01/2021, 5:25 PM

## 2021-04-01 NOTE — Progress Notes (Addendum)
Pt is alert, oriented to self. Able to follow simple commands. NIHS limited due to pt not understanding requests. Denies pain, prn tylenol for pain AD score. A-fib with controlled rate all other VSS. Voiding amber urine. Refused lunch. Appears to be in no acute distress. Will continue to monitor.

## 2021-04-01 NOTE — Progress Notes (Signed)
Second bolus initiated as ordered and patient was able to void another 28ml via Purewick but mostly outside of device. Bolus continued per order. K 3.4. MD aware.

## 2021-04-02 LAB — BASIC METABOLIC PANEL
Anion gap: 7 (ref 5–15)
BUN: 21 mg/dL (ref 8–23)
CO2: 23 mmol/L (ref 22–32)
Calcium: 8.1 mg/dL — ABNORMAL LOW (ref 8.9–10.3)
Chloride: 111 mmol/L (ref 98–111)
Creatinine, Ser: 0.68 mg/dL (ref 0.44–1.00)
GFR, Estimated: >60 mL/min (ref >60)
Glucose, Bld: 105 mg/dL — ABNORMAL HIGH (ref 70–99)
Potassium: 4 mmol/L (ref 3.5–5.1)
Sodium: 141 mmol/L (ref 135–145)

## 2021-04-02 LAB — CBC
HCT: 34.5 % — ABNORMAL LOW (ref 36.0–46.0)
Hemoglobin: 11.6 g/dL — ABNORMAL LOW (ref 12.0–15.0)
MCH: 30.1 pg (ref 26.0–34.0)
MCHC: 33.6 g/dL (ref 30.0–36.0)
MCV: 89.4 fL (ref 80.0–100.0)
Platelets: 302 10*3/uL (ref 150–400)
RBC: 3.86 MIL/uL — ABNORMAL LOW (ref 3.87–5.11)
RDW: 13.2 % (ref 11.5–15.5)
WBC: 11.1 10*3/uL — ABNORMAL HIGH (ref 4.0–10.5)
nRBC: 0 % (ref 0.0–0.2)

## 2021-04-02 LAB — PROCALCITONIN: Procalcitonin: 0.55 ng/mL

## 2021-04-02 NOTE — Care Management Important Message (Signed)
Important Message  Patient Details  Name: [name] MRN: [id number] Date of Birth: 1941/01/03   Medicare Important Message Given:  N/A - LOS <3 / Initial given by admissions     [name] 04/02/2021, 7:47 AM

## 2021-04-02 NOTE — Progress Notes (Signed)
PROGRESS NOTE    [name]  TGG:[id number] DOB: [birth year] DOA: 03/29/2021 PCP: [name], DO    Brief Narrative:  80 year old female with mild dementia, atrial fibrillation who has not been compliant with anticoagulation, admitted to the hospital with acute change in mental status.  MRI brain confirmed acute CVA.  She is being admitted for further work-up. Hospital course complicated by development of neuroleptic malignant syndrome after receiving haloperidol for agitation. Overall, her condition is improving and plans are for DC to SNF when improved.   Assessment & Plan:   Active Problems:   HTN (hypertension)   Atrial fibrillation (HCC)   Chronic venous insufficiency   Delirium   Cellulitis   Acute CVA (cerebrovascular accident) ([place])   Neuroleptic malignant syndrome   Acute CVA.  Patient was admitted with acute change in mental status.  She was increasingly confused.  Urinalysis did not show any signs of infection.  MRI of the brain performed which showed evidence of embolic infarcts.  Per records, she had not been compliant with anticoagulation.  Per stroke pathway, MRA head, carotid dopplers and echo were unremarkable. A1c 5.5 and lipid panel shows LDL of 73 . Seen by physical therapy with recommendations for SNF.  She has been continued on Xarelto.  Fever. Likely secondary to neuroleptic malignant syndrome. Appreciate neurology input. Haloperidol discontinued. Overall fever/diaphoresis/rigidity improving. Mental status improving. Infectious work up has been unrevealing. Continue to follow  Acute delirium.  Metabolic work-up has been unrevealing.  Initially felt to be related to CVA. Subsequent change in mental status may be related to NMS. Appreciate neurology assistance. Mental status improving.   Permanent atrial fibrillation.  Continue rate control with metoprolol.  Heart rate is currently stable.  Continue anticoagulation with Xarelto.  Hypertension.  Blood pressure  mildly elevated, will continue metoprolol.  Avoid aggressive blood pressure control in the setting of acute stroke.  Chronic venous stasis bilaterally with possible underlying cellulitis.  Started on Ancef.  Keep lower extremities elevated.  Will likely transition to Augmentin as she clinically improves. Erythema improving  Mild dementia.  Patient has outpatient evaluation scheduled with neurology on 6/8  DVT prophylaxis:  rivaroxaban (XARELTO) tablet 20 mg  Code Status: Full code Family Communication:updated daughter in law [name] Turnbo over the phone Disposition Plan: Status is: Inpatient  The patient will require care spanning > 2 midnights and should be moved to inpatient because: Altered mental status and Inpatient level of care appropriate due to severity of illness  Dispo: The patient is from: Home              Anticipated d/c is to: SNF              Patient currently is medically stable to d/c.   Difficult to place patient No   Consultants:   Neurology  Procedures:     Antimicrobials:   Ancef 5/26>   Subjective: Patient seen laying in bed. She appears comfortable. She does not realize she is in the hospital.  Objective: Vitals:   04/02/21 0009 04/02/21 0123 04/02/21 0415 04/02/21 0729  BP: (!) 172/76 133/69 (!) 133/57 136/67  Pulse: 86  71 71  Resp: 17 18 20 20   Temp: 99.3 F (37.4 C)  97.9 F (36.6 C) 99.4 F (37.4 C)  TempSrc:    Axillary  SpO2:   96% 96%  Weight:      Height:        Intake/Output Summary (Last 24 hours) at 04/02/2021 1041  Last data filed at 04/02/2021 1013 Gross per 24 hour  Intake 2876.38 ml  Output 400 ml  Net 2476.38 ml   Filed Weights   03/29/21 1119 03/31/21 1659 04/01/21 2200  Weight: 101 kg 99.5 kg 99.5 kg    Examination:  General exam: Alert, awake, no distress Respiratory system: Clear to auscultation. Respiratory effort normal. Cardiovascular system:RRR. No murmurs, rubs, gallops. Gastrointestinal system:  Abdomen is nondistended, soft and nontender. No organomegaly or masses felt. Normal bowel sounds heard. Central nervous system: Alert and oriented. No focal neurological deficits. Extremities: No C/C/E, +pedal pulses Skin: No rashes, lesions or ulcers Psychiatry: confused, pleasant, cooperative      Data Reviewed: I have personally reviewed following labs and imaging studies  CBC: Recent Labs  Lab 03/29/21 1159 03/30/21 0515 03/31/21 1305 04/02/21 0357  WBC 7.9 6.6 11.5* 11.1*  NEUTROABS 5.6  --   --   --   HGB 13.7 12.5 13.8 11.6*  HCT 41.1 37.4 40.4 34.5*  MCV 90.1 90.1 88.2 89.4  PLT 348 277 323 301   Basic Metabolic Panel: Recent Labs  Lab 03/29/21 1159 03/30/21 0515 03/31/21 1305 04/01/21 0413 04/02/21 0357  NA 142 138 138 141 141  K 4.6 3.9 3.9 3.4* 4.0  CL 108 106 103 107 111  CO2 22 24 25 25 23   GLUCOSE 92 82 131* 107* 105*  BUN 19 15 19 22 21   CREATININE 0.80 0.73 0.94 0.83 0.68  CALCIUM 9.2 8.7* 8.7* 8.4* 8.1*   GFR: Estimated Creatinine Clearance: 72.8 mL/min (by C-G formula based on SCr of 0.68 mg/dL). Liver Function Tests: Recent Labs  Lab 03/29/21 1159 03/30/21 0515 03/31/21 1305 04/01/21 0413  AST 30 23 24 24   ALT 14 15 11 11   ALKPHOS 72 63 62 58  BILITOT 1.2 1.3* 1.5* 1.4*  PROT 7.5 6.3* 7.1 6.7  ALBUMIN 4.0 3.3* 3.5 3.1*   No results for input(s): LIPASE, AMYLASE in the last 168 hours. Recent Labs  Lab 03/29/21 2001  AMMONIA 14   Coagulation Profile: Recent Labs  Lab 03/29/21 1159  INR 1.0   Cardiac Enzymes: Recent Labs  Lab 03/31/21 1504 04/01/21 0413  CKTOTAL 238* 269*   BNP (last 3 results) No results for input(s): PROBNP in the last 8760 hours. HbA1C: No results for input(s): HGBA1C in the last 72 hours. CBG: Recent Labs  Lab 03/29/21 1119 03/31/21 1655  GLUCAP 80 124*   Lipid Profile: No results for input(s): CHOL, HDL, LDLCALC, TRIG, CHOLHDL, LDLDIRECT in the last 72 hours. Thyroid Function Tests: No  results for input(s): TSH, T4TOTAL, FREET4, T3FREE, THYROIDAB in the last 72 hours. Anemia Panel: No results for input(s): VITAMINB12, FOLATE, FERRITIN, TIBC, IRON, RETICCTPCT in the last 72 hours. Sepsis Labs: Recent Labs  Lab 03/31/21 1305 03/31/21 1504 03/31/21 2045 03/31/21 2311 04/01/21 0413 04/02/21 0357  PROCALCITON  --  0.26  --   --  0.75 0.55  LATICACIDVEN 1.6 2.5* 1.8 1.4  --   --     Recent Results (from the past 240 hour(s))  Resp Panel by RT-PCR (Flu A&B, Covid)     Status: None   Collection Time: 03/29/21  6:02 PM   Specimen: Nasopharyngeal(NP) swabs in vial transport medium  Result Value Ref Range Status   SARS Coronavirus 2 by RT PCR NEGATIVE NEGATIVE Final    Comment: (NOTE) SARS-CoV-2 target nucleic acids are NOT DETECTED.  The SARS-CoV-2 RNA is generally detectable in upper respiratory specimens during the acute phase of  infection. The lowest concentration of SARS-CoV-2 viral copies this assay can detect is 138 copies/mL. A negative result does not preclude SARS-Cov-2 infection and should not be used as the sole basis for treatment or other patient management decisions. A negative result may occur with  improper specimen collection/handling, submission of specimen other than nasopharyngeal swab, presence of viral mutation(s) within the areas targeted by this assay, and inadequate number of viral copies(<138 copies/mL). A negative result must be combined with clinical observations, patient history, and epidemiological information. The expected result is Negative.  Fact Sheet for Patients:  EntrepreneurPulse.com.au  Fact Sheet for Healthcare Providers:  IncredibleEmployment.be  This test is no t yet approved or cleared by the [place] FDA and  has been authorized for detection and/or diagnosis of SARS-CoV-2 by FDA under an Emergency Use Authorization (EUA). This EUA will remain  in effect (meaning this test can  be used) for the duration of the COVID-19 declaration under Section 564(b)(1) of the Act, 21 U.S.C.section 360bbb-3(b)(1), unless the authorization is terminated  or revoked sooner.       Influenza A by PCR NEGATIVE NEGATIVE Final   Influenza B by PCR NEGATIVE NEGATIVE Final    Comment: (NOTE) The Xpert Xpress SARS-CoV-2/FLU/RSV plus assay is intended as an aid in the diagnosis of influenza from Nasopharyngeal swab specimens and should not be used as a sole basis for treatment. Nasal washings and aspirates are unacceptable for Xpert Xpress SARS-CoV-2/FLU/RSV testing.  Fact Sheet for Patients: EntrepreneurPulse.com.au  Fact Sheet for Healthcare Providers: IncredibleEmployment.be  This test is not yet approved or cleared by the [place] FDA and has been authorized for detection and/or diagnosis of SARS-CoV-2 by FDA under an Emergency Use Authorization (EUA). This EUA will remain in effect (meaning this test can be used) for the duration of the COVID-19 declaration under Section 564(b)(1) of the Act, 21 U.S.C. section 360bbb-3(b)(1), unless the authorization is terminated or revoked.  Performed at [facility], [address] [place], [place] [address]   CULTURE, BLOOD (ROUTINE X 2) w Reflex to ID Panel     Status: None (Preliminary result)   Collection Time: 03/31/21  1:05 PM   Specimen: BLOOD  Result Value Ref Range Status   Specimen Description BLOOD LEFT AC  Final   Special Requests   Final    BOTTLES DRAWN AEROBIC AND ANAEROBIC Blood Culture results may not be optimal due to an excessive volume of blood received in culture bottles   Culture   Final    NO GROWTH 2 DAYS Performed at [facility], [address], [place], [place] [address]    Report Status PENDING  Incomplete  CULTURE, BLOOD (ROUTINE X 2) w Reflex to ID Panel     Status: None (Preliminary result)   Collection Time: 03/31/21  1:15 PM   Specimen:  BLOOD  Result Value Ref Range Status   Specimen Description BLOOD RIGHT AC  Final   Special Requests   Final    BOTTLES DRAWN AEROBIC AND ANAEROBIC Blood Culture results may not be optimal due to an excessive volume of blood received in culture bottles   Culture   Final    NO GROWTH 2 DAYS Performed at [facility], [address], [place], [place] [address]    Report Status PENDING  Incomplete  MRSA PCR Screening     Status: Abnormal   Collection Time: 03/31/21  5:02 PM   Specimen: Nasopharyngeal  Result Value Ref Range Status  MRSA by PCR POSITIVE (A) NEGATIVE Final    Comment:        The GeneXpert MRSA Assay (FDA approved for NASAL specimens only), is one component of a comprehensive MRSA colonization surveillance program. It is not intended to diagnose MRSA infection nor to guide or monitor treatment for MRSA infections. RESULT CALLED TO, READ BACK BY AND VERIFIED WITH: KRANCHESICA BELL at [place] on 03/31/2021 by CAF Performed at [facility], [address] [place], [place] [address]   Urine Culture     Status: Abnormal (Preliminary result)   Collection Time: 04/01/21 12:43 AM   Specimen: Urine, Random  Result Value Ref Range Status   Specimen Description   Final    URINE, RANDOM Performed at [facility], [address], [place], [place] [address]    Special Requests   Final    NONE Performed at [facility], [place], [place] [address]    Culture 10,000 COLONIES/mL PSEUDOMONAS AERUGINOSA (A)  Final   Report Status PENDING  Incomplete         Radiology Studies: DG Chest Port 1 View  Result Date: 03/31/2021 CLINICAL DATA:  80 year old female with fever and altered mental status. EXAM: PORTABLE CHEST - 1 VIEW COMPARISON:  03/29/2021. FINDINGS: The patient is rotated to the left. Low lung volumes. The mediastinal contours are within normal limits. Stable cardiomegaly. Mild bibasilar subsegmental atelectasis.  The lungs are otherwise clear bilaterally without evidence of focal consolidation, pleural effusion, or pneumothorax. Atherosclerotic calcifications of the aortic arch. No acute osseous abnormality. IMPRESSION: Bibasilar subsegmental atelectasis. Stable cardiomegaly. Aortic Atherosclerosis (ICD10-I70.0). Electronically Signed   By: Ruthann Cancer MD   On: 03/31/2021 13:03        Scheduled Meds: .  stroke: mapping our early stages of recovery book   Does not apply Once  . Chlorhexidine Gluconate Cloth  6 each Topical Q0600  . metoprolol succinate  50 mg Oral Q breakfast  . mupirocin ointment  1 application Nasal BID  . rivaroxaban  20 mg Oral Daily  . sodium chloride flush  3 mL Intravenous Once   Continuous Infusions: . sodium chloride 100 mL/hr at 04/02/21 0937  .  ceFAZolin (ANCEF) IV Stopped (04/02/21 0546)     LOS: 3 days    Time spent: 31mins    Kathie Dike, MD [facility]   If 7PM-7AM, please contact night-coverage [email or web address]  04/02/2021, 10:41 AM

## 2021-04-02 NOTE — Progress Notes (Addendum)
Neurology Progress Note  Patient ID: 80 yo woman with mild dementia, a fib not compliant with AC as outpatient presented with AMS and found to have multifocal subcentimeter embolic L hemispheric infarcts. After receiving multiple doses of antipsychotics for agitation, on 03/31/21 she developed hyperthermia, rigidity, worsening mental status, sympathetic nervous system lability c/w clinical diagnostic criteria for neuroleptic malignant syndrome.  Subjective: - Rigidity, mental status, and vital sign instability improving and now remains stable for upwards of 24 hours -No fever in the past 24 hours  Exam: Vitals:   04/02/21 0415 04/02/21 0729  BP: (!) 133/57 136/67  Pulse: 71 71  Resp: 20 20  Temp: 97.9 F (36.6 C) 99.4 F (37.4 C)  SpO2: 96% 96%   Gen: In bed, NAD Resp: non-labored breathing, no acute distress Abd: soft, nt  Neuro: MS: oriented to self and hospital, states she is feeling better today and is making jokes.  Poor attention concentration. CN: PERRL, VFF, EOMI, sensation intact, face symmetric, hearing intact to voice Motor: anti-gravity in all extremities but gets confused following instructions for formal strength exam. Rigidity markedly improved since yesterday but not resolved Sensory: SILT DTR: 2+ brisk symmetric throughout, toes equiv bilat  Data Stroke w/u this admission:   Imaging personally reviewed  MRI brain Two acute subcentimeter infarctions affecting the deep insula on the left in the left parietal cortex. Appearance is embolic (from L carotid or central embolic source). Chronic infarcts L occipital and L frontal lobe. CSVID.  MRA head No proximal intracranial vessel occlusion or significant stenosis.   Carotid US [place] carotid atherosclerosis. No hemodynamically significant ICA stenosis. Degree of narrowing less than 50% bilaterally by ultrasound criteria. Patent antegrade vertebral flow bilaterally  TTE 1. Left ventricular ejection  fraction, by estimation, is 60 to 65%. The left ventricle has normal function. The left ventricle has no regional wall motion abnormalities. There is mild left ventricular hypertrophy. Left ventricular diastolic parameters are indeterminate. ?2. Right ventricular systolic function is normal. The right ventricular size is normal. There is mildly elevated pulmonary artery systolic pressure. The estimated right ventricular systolic pressure is 43.1 mmHg. ?3. Left atrial size was severely dilated. ?4. Right atrial size was severely dilated. ?5. The mitral valve is normal in structure. Mild mitral valve regurgitation.   LDL 73 A1c 5.5  Impression: 80 yo woman with mild dementia, a fib not compliant with AC as outpatient presented with AMS and found to have multifocal subcentimeter embolic L hemispheric infarcts. After receiving multiple doses of antipsychotics for agitation, on 03/31/21 she developed hyperthermia, rigidity, worsening mental status, sympathetic nervous system lability c/w clinical diagnostic criteria for neuroleptic malignant syndrome. Also noted to have punctate multifocal left hemispheric infarct-likely embolic in the setting of A. fib not on anticoagulation   Recommendations:  # Neuroleptic malignant syndrome 5/28 - improved after withdrawal of offending agents - Antipsychotics discontinued, do not administer any further dopamine antagonists under any circumstances (no antipsychotics, also avoid antiemetics) - Ativan 1mg  IV q 2 hrs pro rigidity or agitation; monitor respiratory status - No need for dantrolene - Recheck CK AM - Fluid resuscitation as needed for VS stabilization and adequate urine output  # Multifocal L hemispheric infarcts favored to be 2/2 central embolic source in the setting of a fib not compliant with AC - Stroke w/u complete - Continue Xarelto 20mg  qhs in setting of a fib -Goal LDL less than 70.  She is nearly at goal with LDL 73.  I would prefer low-dose  statin  to keep her below goal, but given her recent CK derangement, would hold off on statin for now.  Can be initiated by the primary care provider as an outpatient.- communicated with the hospitalist, Dr. Roderic Palau.  Inpatient neurology will be available as needed.  Please call with questions. Plan relayed to the hospitalist-Dr. Memon-via secure chat.  -- Amie Portland, MD Neurologist [facility] Pager: [phone]

## 2021-04-02 NOTE — Progress Notes (Signed)
Pt is agitated, keep taking her tele off, tele discontinue as order, also pt pulled her IV out,

## 2021-04-02 NOTE — NC FL2 (Signed)
[place] LEVEL OF CARE SCREENING TOOL     IDENTIFICATION  Patient Name: [name] Birthdate: 11-07-1940 Sex: female Admission Date (Current Location): 03/29/2021  [place] and [place] Number:  Engineering geologist and Address:  [facility], [address] [place], [place] [address]      Provider Number: [id number]  Attending Physician Name and Address:  Kathie Dike, MD  Relative Name and Phone Number:  [name] Caster Windt (daughter) [phone]    Current Level of Care: Hospital Recommended Level of Care: [place] Prior Approval Number:    Date Approved/Denied:   PASRR Number: [id number]  Discharge Plan: SNF    Current Diagnoses: Patient Active Problem List   Diagnosis Date Noted  . Neuroleptic malignant syndrome 04/01/2021  . Acute CVA (cerebrovascular accident) ([place]) 03/30/2021  . Acute confusion 03/29/2021  . Delirium 03/29/2021  . Cellulitis 03/29/2021  . Chronic venous insufficiency 10/14/2016  . Atrial fibrillation ([place]) 06/15/2015  . Rash 06/15/2015  . HTN (hypertension) 04/11/2015  . Osteoarthritis 04/11/2015    Orientation RESPIRATION BLADDER Height & Weight     Self  Normal Incontinent,External catheter Weight: 99.5 kg Height:  5\' 10"  (177.8 cm)  BEHAVIORAL SYMPTOMS/MOOD NEUROLOGICAL BOWEL NUTRITION STATUS      Incontinent Diet (heart healthy)  AMBULATORY STATUS COMMUNICATION OF NEEDS Skin   Extensive Assist Verbally Other (Comment) (swelling and redness ankles)                       Personal Care Assistance Level of Assistance  Bathing,Feeding,Dressing Bathing Assistance: Maximum assistance Feeding assistance: Maximum assistance Dressing Assistance: Maximum assistance     Functional Limitations Info             SPECIAL CARE FACTORS FREQUENCY  PT (By licensed PT),OT (By licensed OT)     PT Frequency: 5 times per week OT Frequency: 5 times per week             Contractures Contractures Info: Not present    Additional Factors Info  Code Status,Allergies Code Status Info: Full             Current Medications (04/02/2021):  This is the current hospital active medication list Current Facility-Administered Medications  Medication Dose Route Frequency Provider Last Rate Last Admin  .  stroke: mapping our early stages of recovery book   Does not apply Once Kathie Dike, MD      . 0.9 %  sodium chloride infusion   Intravenous Continuous Kathie Dike, MD 100 mL/hr at 04/02/21 1530 New Bag at 04/02/21 1530  . acetaminophen (TYLENOL) tablet 650 mg  650 mg Oral Q6H PRN Kathie Dike, MD   650 mg at 04/02/21 0847   Or  . acetaminophen (TYLENOL) suppository 650 mg  650 mg Rectal Q6H PRN Kathie Dike, MD      . ceFAZolin (ANCEF) IVPB 2g/100 mL premix  2 g Intravenous Q8H Kathie Dike, MD   Stopped at 04/02/21 1407  . Chlorhexidine Gluconate Cloth 2 % PADS 6 each  6 each Topical Q0600 Kathie Dike, MD   6 each at 04/02/21 0937  . hydrALAZINE (APRESOLINE) injection 10 mg  10 mg Intravenous Q6H PRN Kathie Dike, MD      . LORazepam (ATIVAN) injection 1 mg  1 mg Intravenous Q2H PRN Kathie Dike, MD   1 mg at 04/02/21 1535  . metoprolol succinate (TOPROL-XL) 24 hr tablet 50 mg  50 mg Oral Q breakfast Memon, [place],  MD   50 mg at 04/02/21 0847  . mupirocin ointment (BACTROBAN) 2 % 1 application  1 application Nasal BID Kathie Dike, MD   1 application at 51/07/12 [id number]  . rivaroxaban (XARELTO) tablet 20 mg  20 mg Oral Daily Kathie Dike, MD   20 mg at 04/02/21 0939  . sodium chloride flush (NS) 0.9 % injection 3 mL  3 mL Intravenous Once Kathie Dike, MD         Discharge Medications: Please see discharge summary for a list of discharge medications.  Relevant Imaging Results:  Relevant Lab Results:   Additional Information ss# [id number]  Shelbie Hutching, RN

## 2021-04-02 NOTE — Plan of Care (Signed)
Pt transferred to 1C from CCU overnight. Oriented to self only, conversation not following any clear path. No s/s pain or difficulty breathing. Afib on telemetry, VSS. Q2hr turns maintained. Adequate UO, x1 BM overnight. Fall/safety precautions in place, rounding performed, needs/concerns addressed.  Problem: Education: Goal: Knowledge of secondary prevention will improve Outcome: Progressing Goal: Knowledge of patient specific risk factors addressed and post discharge goals established will improve Outcome: Progressing Goal: Individualized Educational Video(s) Outcome: Progressing   Problem: Education: Goal: Knowledge of General Education information will improve Description: Including pain rating scale, medication(s)/side effects and non-pharmacologic comfort measures Outcome: Progressing   Problem: Health Behavior/Discharge Planning: Goal: Ability to manage health-related needs will improve Outcome: Progressing   Problem: Clinical Measurements: Goal: Ability to maintain clinical measurements within normal limits will improve Outcome: Progressing Goal: Will remain free from infection Outcome: Progressing Goal: Diagnostic test results will improve Outcome: Progressing Goal: Respiratory complications will improve Outcome: Progressing Goal: Cardiovascular complication will be avoided Outcome: Progressing   Problem: Activity: Goal: Risk for activity intolerance will decrease Outcome: Progressing   Problem: Nutrition: Goal: Adequate nutrition will be maintained Outcome: Progressing   Problem: Coping: Goal: Level of anxiety will decrease Outcome: Progressing   Problem: Elimination: Goal: Will not experience complications related to bowel motility Outcome: Progressing Goal: Will not experience complications related to urinary retention Outcome: Progressing   Problem: Pain Managment: Goal: General experience of comfort will improve Outcome: Progressing   Problem:  Safety: Goal: Ability to remain free from injury will improve Outcome: Progressing   Problem: Skin Integrity: Goal: Risk for impaired skin integrity will decrease Outcome: Progressing

## 2021-04-02 NOTE — TOC Progression Note (Signed)
Transition of Care [facility]) - Progression Note    Patient Details  Name: [name] MRN: [id number] Date of Birth: Jan 09, 1941  Transition of Care [facility] CM/SW Contact  Shelbie Hutching, RN Phone Number: 04/02/2021, 5:19 PM  Clinical Narrative:    PT needs to reevaluate patient after leaving ICU.  Current recommendation is SNF.  Daughter [name] is interested in SNF.  Bed search started.  TOC will follow up with daughter once bed offers received.         Expected Discharge Plan and Services                                                 Social Determinants of Health (SDOH) Interventions    Readmission Risk Interventions No flowsheet data found.

## 2021-04-03 LAB — URINE CULTURE: Culture: 10000 — AB

## 2021-04-03 LAB — CBC
HCT: 37.7 % (ref 36.0–46.0)
Hemoglobin: 13 g/dL (ref 12.0–15.0)
MCH: 30.2 pg (ref 26.0–34.0)
MCHC: 34.5 g/dL (ref 30.0–36.0)
MCV: 87.7 fL (ref 80.0–100.0)
Platelets: 350 10*3/uL (ref 150–400)
RBC: 4.3 MIL/uL (ref 3.87–5.11)
RDW: 12.9 % (ref 11.5–15.5)
WBC: 9 10*3/uL (ref 4.0–10.5)
nRBC: 0 % (ref 0.0–0.2)

## 2021-04-03 LAB — CK: Total CK: 370 U/L — ABNORMAL HIGH (ref 38–234)

## 2021-04-03 LAB — BASIC METABOLIC PANEL
Anion gap: 10 (ref 5–15)
BUN: 13 mg/dL (ref 8–23)
CO2: 22 mmol/L (ref 22–32)
Calcium: 8.5 mg/dL — ABNORMAL LOW (ref 8.9–10.3)
Chloride: 106 mmol/L (ref 98–111)
Creatinine, Ser: 0.57 mg/dL (ref 0.44–1.00)
GFR, Estimated: >60 mL/min (ref >60)
Glucose, Bld: 109 mg/dL — ABNORMAL HIGH (ref 70–99)
Potassium: 3.7 mmol/L (ref 3.5–5.1)
Sodium: 138 mmol/L (ref 135–145)

## 2021-04-03 MED ORDER — SODIUM CHLORIDE 0.9 % IV SOLN
1.0000 g | Freq: Three times a day (TID) | INTRAVENOUS | Status: DC
  Administered 2021-04-03 – 2021-04-04 (×3): 1 g via INTRAVENOUS
  Filled 2021-04-03 (×5): qty 1

## 2021-04-03 NOTE — Consult Note (Signed)
Pharmacy Antibiotic Note  [name] is a 80 y.o. female admitted on 03/29/2021 with acute change in mental status/acute CVA. Hospital course complicated by development of neuroleptic malignant syndrome after receiving haloperidol for agitation. Urine culture collected growing pan-sensitive Pseudomonas. Pharmacy has been consulted for meropenem dosing. Due to concern for increased altered mental status and neurotoxicity, neurology wanting to avoid cefepime   Plan:  Initiate meropenem 1000 mg Q8H   Continue to monitor renal function daily  Height: 5\' 10"  (177.8 cm) Weight: 99.5 kg (219 lb 5.7 oz) IBW/kg (Calculated) : 68.5  Temp (24hrs), Avg:98.9 F (37.2 C), Min:98.2 F (36.8 C), Max:99.8 F (37.7 C)  Recent Labs  Lab 03/29/21 1159 03/30/21 0515 03/31/21 1305 03/31/21 1504 03/31/21 2045 03/31/21 2311 04/01/21 0413 04/02/21 0357 04/03/21 1418  WBC 7.9 6.6 11.5*  --   --   --   --  11.1* 9.0  CREATININE 0.80 0.73 0.94  --   --   --  0.83 0.68 0.57  LATICACIDVEN  --   --  1.6 2.5* 1.8 1.4  --   --   --     Estimated Creatinine Clearance: 72.8 mL/min (by C-G formula based on SCr of 0.57 mg/dL).    Allergies  Allergen Reactions  . Amlodipine     Leg swelling  . Hctz [Hydrochlorothiazide] Nausea Only and Rash    Antimicrobials this admission: 5/26 cefazolin >> 5/31 5/31 meropenem >>   Dose adjustments this admission: n/a  Microbiology results: 5/28 BCx: NGTD 5/29 UCx: Pseudomonas -- pan- sensitive     Thank you for allowing pharmacy to be a part of this patient's care.  Dorothe Pea, PharmD, BCPS Clinical Pharmacist  04/03/2021 2:46 PM

## 2021-04-03 NOTE — Progress Notes (Signed)
PROGRESS NOTE    [name]  TKZ:[id number] DOB: 12/30/1940 DOA: 03/29/2021 PCP: [name], DO    Brief Narrative:  80 year old female with mild dementia, atrial fibrillation who has not been compliant with anticoagulation, admitted to the hospital with acute change in mental status.  MRI brain confirmed acute CVA.  She is being admitted for further work-up. Hospital course complicated by development of neuroleptic malignant syndrome after receiving haloperidol for agitation. Overall, her condition is improving and plans are for DC to SNF when improved.   Assessment & Plan:   Active Problems:   HTN (hypertension)   Atrial fibrillation (HCC)   Chronic venous insufficiency   Delirium   Cellulitis   Acute CVA (cerebrovascular accident) ([place])   Neuroleptic malignant syndrome   Acute CVA.  Patient was admitted with acute change in mental status.  She was increasingly confused.  Urinalysis did not show any signs of infection.  MRI of the brain performed which showed evidence of embolic infarcts.  Per records, she had not been compliant with anticoagulation.  Per stroke pathway, MRA head, carotid dopplers and echo were unremarkable. A1c 5.5 and lipid panel shows LDL of 73 . Seen by physical therapy with recommendations for SNF.  She has been continued on Xarelto.  Fever. Likely secondary to neuroleptic malignant syndrome. Appreciate neurology input. Haloperidol discontinued. Overall fever/diaphoresis/rigidity improving.  Continue to follow  Mild rhabdomyolysis. Serum CK levels trending up, currently 370. Will continue on IV fluids and follow.  Possible UTI. Urine culture growing 10K colonies pseudomonas. With persistent delirium, will elect to treat this with meropenem  Acute delirium.  Likely multifactorial. She has had several reasons to have change in mental status during hospitalization. CVA, sundowning, NMS, possible UTI  Permanent atrial fibrillation.  Continue rate control  with metoprolol.  Heart rate is currently stable.  Continue anticoagulation with Xarelto.  Hypertension.  Blood pressure mildly elevated, will continue metoprolol.  She also has prn hydralazine  Chronic venous stasis bilaterally with possible underlying cellulitis.  Started on Ancef.  Keep lower extremities elevated.  Will likely transition to Augmentin as she clinically improves. Erythema improving  Mild dementia.  Patient has outpatient evaluation scheduled with neurology on 6/8  DVT prophylaxis:  rivaroxaban (XARELTO) tablet 20 mg  Code Status: Full code Family Communication:updated daughter in law [name] Shrider over the phone Disposition Plan: Status is: Inpatient  The patient will require care spanning > 2 midnights and should be moved to inpatient because: Altered mental status and Inpatient level of care appropriate due to severity of illness  Dispo: The patient is from: Home              Anticipated d/c is to: SNF              Patient currently is not medically stable to d/c.   Difficult to place patient No   Consultants:   Neurology  Procedures:     Antimicrobials:   Ancef 5/26>5/21  Meropenem 5/31>   Subjective: Patient has been more confused and agitated today, not following commands  Objective: Vitals:   04/03/21 0903 04/03/21 0958 04/03/21 1138 04/03/21 1400  BP: (!) 176/112 (!) 155/88 (!) 172/90 (!) 150/78  Pulse: 94  70   Resp:   19   Temp:   98.3 F (36.8 C)   TempSrc:   Oral   SpO2:   97%   Weight:      Height:        Intake/Output Summary (  Last 24 hours) at 04/03/2021 1605 Last data filed at 04/03/2021 1022 Gross per 24 hour  Intake 1441.78 ml  Output 400 ml  Net 1041.78 ml   Filed Weights   03/29/21 1119 03/31/21 1659 04/01/21 2200  Weight: 101 kg 99.5 kg 99.5 kg    Examination:  General exam: agitated, confused Respiratory system: Clear to auscultation. Respiratory effort normal. Cardiovascular system:RRR. No murmurs, rubs,  gallops. Gastrointestinal system: Abdomen is nondistended, soft and nontender. No organomegaly or masses felt. Normal bowel sounds heard. Central nervous system:  No focal neurological deficits. Extremities: bilateral venous stasis dermatitis Skin: No rashes, lesions or ulcers Psychiatry: confused, agitated.       Data Reviewed: I have personally reviewed following labs and imaging studies  CBC: Recent Labs  Lab 03/29/21 1159 03/30/21 0515 03/31/21 1305 04/02/21 0357 04/03/21 1418  WBC 7.9 6.6 11.5* 11.1* 9.0  NEUTROABS 5.6  --   --   --   --   HGB 13.7 12.5 13.8 11.6* 13.0  HCT 41.1 37.4 40.4 34.5* 37.7  MCV 90.1 90.1 88.2 89.4 87.7  PLT 348 277 323 302 962   Basic Metabolic Panel: Recent Labs  Lab 03/30/21 0515 03/31/21 1305 04/01/21 0413 04/02/21 0357 04/03/21 1418  NA 138 138 141 141 138  K 3.9 3.9 3.4* 4.0 3.7  CL 106 103 107 111 106  CO2 24 25 25 23 22   GLUCOSE 82 131* 107* 105* 109*  BUN 15 19 22 21 13   CREATININE 0.73 0.94 0.83 0.68 0.57  CALCIUM 8.7* 8.7* 8.4* 8.1* 8.5*   GFR: Estimated Creatinine Clearance: 72.8 mL/min (by C-G formula based on SCr of 0.57 mg/dL). Liver Function Tests: Recent Labs  Lab 03/29/21 1159 03/30/21 0515 03/31/21 1305 04/01/21 0413  AST 30 23 24 24   ALT 14 15 11 11   ALKPHOS 72 63 62 58  BILITOT 1.2 1.3* 1.5* 1.4*  PROT 7.5 6.3* 7.1 6.7  ALBUMIN 4.0 3.3* 3.5 3.1*   No results for input(s): LIPASE, AMYLASE in the last 168 hours. Recent Labs  Lab 03/29/21 2001  AMMONIA 14   Coagulation Profile: Recent Labs  Lab 03/29/21 1159  INR 1.0   Cardiac Enzymes: Recent Labs  Lab 03/31/21 1504 04/01/21 0413 04/03/21 1418  CKTOTAL 238* 269* 370*   BNP (last 3 results) No results for input(s): PROBNP in the last 8760 hours. HbA1C: No results for input(s): HGBA1C in the last 72 hours. CBG: Recent Labs  Lab 03/29/21 1119 03/31/21 1655  GLUCAP 80 124*   Lipid Profile: No results for input(s): CHOL, HDL,  LDLCALC, TRIG, CHOLHDL, LDLDIRECT in the last 72 hours. Thyroid Function Tests: No results for input(s): TSH, T4TOTAL, FREET4, T3FREE, THYROIDAB in the last 72 hours. Anemia Panel: No results for input(s): VITAMINB12, FOLATE, FERRITIN, TIBC, IRON, RETICCTPCT in the last 72 hours. Sepsis Labs: Recent Labs  Lab 03/31/21 1305 03/31/21 1504 03/31/21 2045 03/31/21 2311 04/01/21 0413 04/02/21 0357  PROCALCITON  --  0.26  --   --  0.75 0.55  LATICACIDVEN 1.6 2.5* 1.8 1.4  --   --     Recent Results (from the past 240 hour(s))  Resp Panel by RT-PCR (Flu A&B, Covid)     Status: None   Collection Time: 03/29/21  6:02 PM   Specimen: Nasopharyngeal(NP) swabs in vial transport medium  Result Value Ref Range Status   SARS Coronavirus 2 by RT PCR NEGATIVE NEGATIVE Final    Comment: (NOTE) SARS-CoV-2 target nucleic acids are NOT DETECTED.  The SARS-CoV-2 RNA is generally detectable in upper respiratory specimens during the acute phase of infection. The lowest concentration of SARS-CoV-2 viral copies this assay can detect is 138 copies/mL. A negative result does not preclude SARS-Cov-2 infection and should not be used as the sole basis for treatment or other patient management decisions. A negative result may occur with  improper specimen collection/handling, submission of specimen other than nasopharyngeal swab, presence of viral mutation(s) within the areas targeted by this assay, and inadequate number of viral copies(<138 copies/mL). A negative result must be combined with clinical observations, patient history, and epidemiological information. The expected result is Negative.  Fact Sheet for Patients:  EntrepreneurPulse.com.au  Fact Sheet for Healthcare Providers:  IncredibleEmployment.be  This test is no t yet approved or cleared by the [place] FDA and  has been authorized for detection and/or diagnosis of SARS-CoV-2 by FDA under an  Emergency Use Authorization (EUA). This EUA will remain  in effect (meaning this test can be used) for the duration of the COVID-19 declaration under Section 564(b)(1) of the Act, 21 U.S.C.section 360bbb-3(b)(1), unless the authorization is terminated  or revoked sooner.       Influenza A by PCR NEGATIVE NEGATIVE Final   Influenza B by PCR NEGATIVE NEGATIVE Final    Comment: (NOTE) The Xpert Xpress SARS-CoV-2/FLU/RSV plus assay is intended as an aid in the diagnosis of influenza from Nasopharyngeal swab specimens and should not be used as a sole basis for treatment. Nasal washings and aspirates are unacceptable for Xpert Xpress SARS-CoV-2/FLU/RSV testing.  Fact Sheet for Patients: EntrepreneurPulse.com.au  Fact Sheet for Healthcare Providers: IncredibleEmployment.be  This test is not yet approved or cleared by the [place] FDA and has been authorized for detection and/or diagnosis of SARS-CoV-2 by FDA under an Emergency Use Authorization (EUA). This EUA will remain in effect (meaning this test can be used) for the duration of the COVID-19 declaration under Section 564(b)(1) of the Act, 21 U.S.C. section 360bbb-3(b)(1), unless the authorization is terminated or revoked.  Performed at [facility], [address] [place], [place] [address]   CULTURE, BLOOD (ROUTINE X 2) w Reflex to ID Panel     Status: None (Preliminary result)   Collection Time: 03/31/21  1:05 PM   Specimen: BLOOD  Result Value Ref Range Status   Specimen Description BLOOD LEFT AC  Final   Special Requests   Final    BOTTLES DRAWN AEROBIC AND ANAEROBIC Blood Culture results may not be optimal due to an excessive volume of blood received in culture bottles   Culture   Final    NO GROWTH 3 DAYS Performed at [facility], [address] [place], [place] [address]    Report Status PENDING  Incomplete  CULTURE, BLOOD (ROUTINE X 2) w Reflex to ID  Panel     Status: None (Preliminary result)   Collection Time: 03/31/21  1:15 PM   Specimen: BLOOD  Result Value Ref Range Status   Specimen Description BLOOD RIGHT AC  Final   Special Requests   Final    BOTTLES DRAWN AEROBIC AND ANAEROBIC Blood Culture results may not be optimal due to an excessive volume of blood received in culture bottles   Culture   Final    NO GROWTH 3 DAYS Performed at [facility], [address] [place],  [address]    Report Status PENDING  Incomplete  MRSA PCR Screening     Status: Abnormal   Collection Time: 03/31/21  5:02 PM   Specimen: Nasopharyngeal  Result Value Ref Range Status   MRSA by PCR POSITIVE (A) NEGATIVE Final    Comment:        The GeneXpert MRSA Assay (FDA approved for NASAL specimens only), is one component of a comprehensive MRSA colonization surveillance program. It is not intended to diagnose MRSA infection nor to guide or monitor treatment for MRSA infections. RESULT CALLED TO, READ BACK BY AND VERIFIED WITH: KRANCHESICA BELL at [place] on 03/31/2021 by CAF Performed at [facility], [address] [place], [place] [address]   Urine Culture     Status: Abnormal   Collection Time: 04/01/21 12:43 AM   Specimen: Urine, Random  Result Value Ref Range Status   Specimen Description   Final    URINE, RANDOM Performed at [facility], [address], [place], [place] [address]    Special Requests   Final    NONE Performed at [facility], [place], [place] [address]    Culture 10,000 COLONIES/mL PSEUDOMONAS AERUGINOSA (A)  Final   Report Status 04/03/2021 FINAL  Final   Organism ID, Bacteria PSEUDOMONAS AERUGINOSA (A)  Final      Susceptibility   Pseudomonas aeruginosa - MIC*    CEFTAZIDIME 4 SENSITIVE Sensitive     CIPROFLOXACIN <=0.25 SENSITIVE Sensitive     GENTAMICIN <=1 SENSITIVE Sensitive     IMIPENEM 1 SENSITIVE Sensitive     PIP/TAZO 8 SENSITIVE Sensitive      CEFEPIME 2 SENSITIVE Sensitive     * 10,000 COLONIES/mL PSEUDOMONAS AERUGINOSA         Radiology Studies: No results found.      Scheduled Meds: .  stroke: mapping our early stages of recovery book   Does not apply Once  . Chlorhexidine Gluconate Cloth  6 each Topical Q0600  . metoprolol succinate  50 mg Oral Q breakfast  . mupirocin ointment  1 application Nasal BID  . rivaroxaban  20 mg Oral Daily  . sodium chloride flush  3 mL Intravenous Once   Continuous Infusions: . sodium chloride 100 mL/hr at 04/03/21 0300  . meropenem (MERREM) IV 1 g (04/03/21 1515)     LOS: 4 days    Time spent: 57mins    Kathie Dike, MD [facility]   If 7PM-7AM, please contact night-coverage [email or web address]  04/03/2021, 4:05 PM

## 2021-04-03 NOTE — Progress Notes (Signed)
Pt very irritable and combative during cleaning of incont episode. Pt using explicit language and swinging arms at staff. Pt also balling up fist and stating "I'll punch you out." Pt given prn ativan and attempts at re-direction made. Bed alarm and floor mats in place for safety.

## 2021-04-03 NOTE — Evaluation (Signed)
Occupational Therapy Evaluation Patient Details Name: GENIECE AKERS MRN: [id number] DOB: 1941/07/20 Today's Date: 04/03/2021    History of Present Illness Anshi Diles is a 76yoF who [facility] on 5/26 with AMS. PMH: AF on BB and xarelto, venous stasis, HTN, memory impairment, cat bite cellulitis (2016). Brain MRI revealing of 8 mm acute infarction affecting the cortical surface of the deep insula on the left. Second subcentimeter acute cortical infarction in the left parietal lobe. Remote Lt occipital and Left frontal lobe infarcts.   Clinical Impression   Patient presenting with decreased I in self care, balance, functional mobility/transfers, endurance, and safety awareness. OT called family to obtain baseline information. Pt lives with daughter and son in law but did not need assistance with self care or mobility PTA. Pt is cognitively impaired this session and having visual hallucinations in room. She sees dogs and cats and is attempting to pet them during session. Pt needing increased time to process information with mod multimodal cuing for sequencing and initiation. Pt able to move B LEs to EOB with cuing. She needed assistance with trunk but then pushing self against therapist with significant posterior bias. Not safe to attempt standing this session. Pt needing max A to return to supine at end of session.  Patient will benefit from acute OT to increase overall independence in the areas of ADLs, functional mobility, and safety awareness in order to safely discharge to next venue of care.    Follow Up Recommendations  SNF;Supervision/Assistance - 24 hour    Equipment Recommendations  Other (comment) (defer to next venue of care)       Precautions / Restrictions Precautions Precautions: Fall      Mobility Bed Mobility Overal bed mobility: Needs Assistance Bed Mobility: Supine to Sit;Sit to Supine     Supine to sit: HOB elevated;Mod assist Sit to supine: Max assist   General bed  mobility comments: Pt able to move B LEs off EOB with cuing but needs heavy mod - max A for trunk support with pt having posterior bias and pushing backwards against therapist    Transfers                 General transfer comment: deferred secondary to safety    Balance                                           ADL either performed or assessed with clinical judgement   ADL Overall ADL's : Needs assistance/impaired     Grooming: Wash/dry hands;Wash/dry face;Bed level;Minimal assistance;Supervision/safety;Set up Grooming Details (indicate cue type and reason): mod multimodal cuing                                     Vision Patient Visual Report: No change from baseline              Pertinent Vitals/Pain Pain Assessment: Faces Faces Pain Scale: Hurts even more Pain Location: L UE Pain Descriptors / Indicators: Discomfort Pain Intervention(s): Limited activity within patient's tolerance;Repositioned;Monitored during session     Hand Dominance Right   Extremity/Trunk Assessment Upper Extremity Assessment Upper Extremity Assessment: Difficult to assess due to impaired cognition;Generalized weakness   Lower Extremity Assessment Lower Extremity Assessment: Difficult to assess due to impaired cognition;Generalized weakness  Communication Communication Communication: No difficulties   Cognition Arousal/Alertness: Awake/alert Behavior During Therapy: WFL for tasks assessed/performed Overall Cognitive Status: Impaired/Different from baseline Area of Impairment: Orientation                 Orientation Level: Person                            Home Living Family/patient expects to be discharged to:: Private residence Living Arrangements: Alone;Children Available Help at Discharge: Family;Available 24 hours/day Type of Home: House                                  Prior Functioning/Environment  Level of Independence: Independent with assistive device(s)        Comments: OT spoke on the phone with pt's daughter in law who reports she lives with her daughter and son in law but that they do not care for her.        OT Problem List: Decreased strength;Impaired balance (sitting and/or standing);Decreased cognition;Decreased knowledge of precautions;Decreased safety awareness;Decreased activity tolerance;Decreased knowledge of use of DME or AE;Decreased coordination      OT Treatment/Interventions: Self-care/ADL training;Manual therapy;Therapeutic exercise;Patient/family education;Balance training;Energy conservation;Therapeutic activities;Cognitive remediation/compensation;DME and/or AE instruction    OT Goals(Current goals can be found in the care plan section) Acute Rehab OT Goals Patient Stated Goal: none stated OT Goal Formulation: Patient unable to participate in goal setting Time For Goal Achievement: 04/17/21 Potential to Achieve Goals: Fair  OT Frequency: Min 2X/week   Barriers to D/C:    none known at this time          AM-PAC OT "6 Clicks" Daily Activity     Outcome Measure Help from another person eating meals?: A Lot Help from another person taking care of personal grooming?: A Lot Help from another person toileting, which includes using toliet, bedpan, or urinal?: Total Help from another person bathing (including washing, rinsing, drying)?: A Lot Help from another person to put on and taking off regular upper body clothing?: A Lot Help from another person to put on and taking off regular lower body clothing?: Total 6 Click Score: 10   End of Session Nurse Communication: Mobility status  Activity Tolerance: Other (comment) (limited by cognition) Patient left: in bed;with call bell/phone within reach;with bed alarm set  OT Visit Diagnosis: Unsteadiness on feet (R26.81);Muscle weakness (generalized) (M62.81)                Time: 1006-1040 OT Time  Calculation (min): 34 min Charges:  OT General Charges $OT Visit: 1 Visit OT Evaluation $OT Eval Moderate Complexity: 1 Mod OT Treatments $Self Care/Home Management : 8-22 mins $Therapeutic Activity: 8-22 mins  Darleen Crocker, MS, OTR/L , CBIS ascom [phone]  04/03/21, 12:24 PM

## 2021-04-03 NOTE — Progress Notes (Signed)
PT Cancellation Note  Patient Details Name: [name] MRN: [id number] DOB: 04/25/1941   Cancelled Treatment:    Reason Eval/Treat Not Completed: Medical issues which prohibited therapy;Patient's level of consciousness (PT services resumed after updated orders received. Attempted treatment. BP remains high 855M-158E systolic, but more limiting is AMS, pt not following simple commands >25% of time poor attention to task.) Will attempt again at later date/time when patient is able to participate.   9:16 AM, 04/03/21 Etta Grandchild, PT, DPT Physical Therapist - [facility]  [phone] ([place])   [place] C 04/03/2021, 9:14 AM

## 2021-04-03 NOTE — Progress Notes (Signed)
SLP Cancellation Note  Patient Details Name: [name] MRN: [id number] DOB: 05/23/1941   Cancelled treatment:       Reason Eval/Treat Not Completed:  (chart reviewed). MD/therapy notes reviewed -- pt has a Baseline of Cognitive decline ("memory impairment" per chart); pt continues to exhibit agitation during recent days per NSG and PT notes. PT note today revealed: "Pt very irritable and combative during cleaning of incont episode. Pt using explicit language and swinging arms at staff. Pt also balling up fist and stating "I'll punch you out." Pt given prn ativan and attempts at re-direction made.".  OT note revealed: "Pt is cognitively impaired this session and having visual hallucinations in room. She sees dogs and cats and is attempting to pet them during session.".  Strongly suspect pt's current presentation is impacted by her Baseline Cognitive decline as noted per chart history.   At this time, pt is not appropriate for a cognitive-linguistic evaluation; it would be difficult to determine true baseline and/or deficits w/ the impact of mental status issues/decline as well as hallucinations and agitation. Pt is verbally communicating her basic wants/needs w/out difficulty to staff per report. Recommend reducing Distractions during contact/sessions and give gentle verbal cues and Time as needed. Pt will benefit from formal cognitive-linguistic evaluation when she is calm and attentive in order to assess needs/strengths to address areas of ADLs including safety awareness at next venue of care. Recommend f/u w/ skilled ST services then. SW/MD updated.     Orinda Kenner, MS, CCC-SLP Speech Language Pathologist Rehab Services [phone] [facility] 04/03/2021, 3:29 PM

## 2021-04-04 LAB — CK: Total CK: 304 U/L — ABNORMAL HIGH (ref 38–234)

## 2021-04-04 LAB — VITAMIN B12: Vitamin B-12: 444 pg/mL (ref 180–914)

## 2021-04-04 LAB — CBC
HCT: 34.2 % — ABNORMAL LOW (ref 36.0–46.0)
Hemoglobin: 11.7 g/dL — ABNORMAL LOW (ref 12.0–15.0)
MCH: 30.3 pg (ref 26.0–34.0)
MCHC: 34.2 g/dL (ref 30.0–36.0)
MCV: 88.6 fL (ref 80.0–100.0)
Platelets: 314 10*3/uL (ref 150–400)
RBC: 3.86 MIL/uL — ABNORMAL LOW (ref 3.87–5.11)
RDW: 13 % (ref 11.5–15.5)
WBC: 6.9 10*3/uL (ref 4.0–10.5)
nRBC: 0 % (ref 0.0–0.2)

## 2021-04-04 LAB — BASIC METABOLIC PANEL
Anion gap: 8 (ref 5–15)
BUN: 17 mg/dL (ref 8–23)
CO2: 22 mmol/L (ref 22–32)
Calcium: 8 mg/dL — ABNORMAL LOW (ref 8.9–10.3)
Chloride: 110 mmol/L (ref 98–111)
Creatinine, Ser: 0.55 mg/dL (ref 0.44–1.00)
GFR, Estimated: >60 mL/min (ref >60)
Glucose, Bld: 93 mg/dL (ref 70–99)
Potassium: 3.1 mmol/L — ABNORMAL LOW (ref 3.5–5.1)
Sodium: 140 mmol/L (ref 135–145)

## 2021-04-04 MED ORDER — NYSTATIN 100000 UNIT/ML MT SUSP
5.0000 mL | Freq: Four times a day (QID) | OROMUCOSAL | Status: DC
  Administered 2021-04-04 – 2021-04-13 (×31): 500000 [IU] via ORAL
  Filled 2021-04-04 (×27): qty 5

## 2021-04-04 MED ORDER — POTASSIUM CHLORIDE CRYS ER 20 MEQ PO TBCR
40.0000 meq | EXTENDED_RELEASE_TABLET | Freq: Once | ORAL | Status: AC
  Administered 2021-04-04: 10:00:00 40 meq via ORAL
  Filled 2021-04-04: qty 2

## 2021-04-04 MED ORDER — SODIUM CHLORIDE 0.9 % IV SOLN
1.0000 g | Freq: Three times a day (TID) | INTRAVENOUS | Status: AC
  Administered 2021-04-04 – 2021-04-05 (×5): 1 g via INTRAVENOUS
  Filled 2021-04-04 (×6): qty 1

## 2021-04-04 NOTE — Progress Notes (Signed)
PROGRESS NOTE    [name]  PYP:[id number] DOB: 1941-01-20 DOA: 03/29/2021 PCP: [name], DO   Brief Narrative: 80 year old with past medical history significant for mild dementia, A. fib who has not been compliant with her anticoagulation admitted to the hospital with acute change in mental status.  MRI brain confirmed acute CVA.  She is being admitted for further work-up.  Hospital course complicated by development of neuroleptic malignant syndrome after receiving haloperidol for agitation.  Her medical condition has improved.  She is awaiting for skilled nursing facility placement   Assessment & Plan:   Active Problems:   HTN (hypertension)   Atrial fibrillation (HCC)   Chronic venous insufficiency   Delirium   Cellulitis   Acute CVA (cerebrovascular accident) ([place])   Neuroleptic malignant syndrome  1-Acute CVA;  Patient was admitted with change in mental status, she was increasingly confused.  MRI of the brain performed which show evidence of embolic infarcts. No compliant with anticoagulation. Started back on Xarelto. Hemoglobin A1c 5.5, LDL 73.  Neuroleptic malignant syndrome: Patient developed fever, diaphoresis and rigidity.  This was thought to be related to haloperidol.  Haloperidol has been discontinued.  Patient has improved clinically This has resolved.  Mild rhabdomyolysis: Received IV fluids.  Possible UTI:  urine culture growing 10K colonies of Pseudomonas.  Due to persistent delirium and she was treated with antibiotics plan to treat for 3 days  Permanent A. fib: Continue rate control with metoprolol Continue with Xarelto  Chronic venous stasis bilateral with possible underlying cellulitis, continue with Fortaz  Mild dementia: Needs follow-up with neurology on 6/8.  Plan to check B12 level  Hypokalemia; replete orally.   Estimated body mass index is 31.47 kg/m as calculated from the following:   Height as of this encounter: 5\' 10"  (1.778 m).    Weight as of this encounter: 99.5 kg.   DVT prophylaxis: Xarelto  Code Status: Full Code.  Family Communication:Son and daughter in law at bedside.  Disposition Plan:  Status is: Inpatient  Remains inpatient appropriate because:IV treatments appropriate due to intensity of illness or inability to take PO   Dispo: The patient is from: Home              Anticipated d/c is to: SNF              Patient currently is not medically stable to d/c.hopefully dc 6/02 if remain stable.    Difficult to place patient No        Consultants:   Neurology   Procedures:   None  Antimicrobials:  Fortaz  Subjective: Patient has improved significantly today, she is alert, pleasantly confuse. She has no received ativan.  No rigidity   Objective: Vitals:   04/04/21 0535 04/04/21 0711 04/04/21 1140 04/04/21 1140  BP: (!) 150/81 (!) 142/77 (!) 155/78 (!) 155/78  Pulse: 76 69 83 82  Resp: 18 18 16 16   Temp: 98.2 F (36.8 C) 98.3 F (36.8 C) 97.9 F (36.6 C) 97.9 F (36.6 C)  TempSrc:  Oral    SpO2: 96% 99% 96% 96%  Weight:      Height:        Intake/Output Summary (Last 24 hours) at 04/04/2021 1609 Last data filed at 04/04/2021 1532 Gross per 24 hour  Intake 2143.17 ml  Output --  Net 2143.17 ml   Filed Weights   03/29/21 1119 03/31/21 1659 04/01/21 2200  Weight: 101 kg 99.5 kg 99.5 kg    Examination:  General exam: Appears calm and comfortable  Respiratory system: Clear to auscultation. Respiratory effort normal. Cardiovascular system: S1 & S2 heard, RRR. No JVD, murmurs, rubs, gallops or clicks. No pedal edema. Gastrointestinal system: Abdomen is nondistended, soft and nontender. No organomegaly or masses felt. Normal bowel sounds heard. Central nervous system: Alert and oriented. Extremities: Symmetric 5 x 5 power.   Data Reviewed: I have personally reviewed following labs and imaging studies  CBC: Recent Labs  Lab 03/29/21 1159 03/30/21 0515 03/31/21 1305  04/02/21 0357 04/03/21 1418 04/04/21 0444  WBC 7.9 6.6 11.5* 11.1* 9.0 6.9  NEUTROABS 5.6  --   --   --   --   --   HGB 13.7 12.5 13.8 11.6* 13.0 11.7*  HCT 41.1 37.4 40.4 34.5* 37.7 34.2*  MCV 90.1 90.1 88.2 89.4 87.7 88.6  PLT 348 277 323 302 350 161   Basic Metabolic Panel: Recent Labs  Lab 03/31/21 1305 04/01/21 0413 04/02/21 0357 04/03/21 1418 04/04/21 0444  NA 138 141 141 138 140  K 3.9 3.4* 4.0 3.7 3.1*  CL 103 107 111 106 110  CO2 25 25 23 22 22   GLUCOSE 131* 107* 105* 109* 93  BUN 19 22 21 13 17   CREATININE 0.94 0.83 0.68 0.57 0.55  CALCIUM 8.7* 8.4* 8.1* 8.5* 8.0*   GFR: Estimated Creatinine Clearance: 71.6 mL/min (by C-G formula based on SCr of 0.55 mg/dL). Liver Function Tests: Recent Labs  Lab 03/29/21 1159 03/30/21 0515 03/31/21 1305 04/01/21 0413  AST 30 23 24 24   ALT 14 15 11 11   ALKPHOS 72 63 62 58  BILITOT 1.2 1.3* 1.5* 1.4*  PROT 7.5 6.3* 7.1 6.7  ALBUMIN 4.0 3.3* 3.5 3.1*   No results for input(s): LIPASE, AMYLASE in the last 168 hours. Recent Labs  Lab 03/29/21 2001  AMMONIA 14   Coagulation Profile: Recent Labs  Lab 03/29/21 1159  INR 1.0   Cardiac Enzymes: Recent Labs  Lab 03/31/21 1504 04/01/21 0413 04/03/21 1418 04/04/21 0444  CKTOTAL 238* 269* 370* 304*   BNP (last 3 results) No results for input(s): PROBNP in the last 8760 hours. HbA1C: No results for input(s): HGBA1C in the last 72 hours. CBG: Recent Labs  Lab 03/29/21 1119 03/31/21 1655  GLUCAP 80 124*   Lipid Profile: No results for input(s): CHOL, HDL, LDLCALC, TRIG, CHOLHDL, LDLDIRECT in the last 72 hours. Thyroid Function Tests: No results for input(s): TSH, T4TOTAL, FREET4, T3FREE, THYROIDAB in the last 72 hours. Anemia Panel: No results for input(s): VITAMINB12, FOLATE, FERRITIN, TIBC, IRON, RETICCTPCT in the last 72 hours. Sepsis Labs: Recent Labs  Lab 03/31/21 1305 03/31/21 1504 03/31/21 2045 03/31/21 2311 04/01/21 0413 04/02/21 0357   PROCALCITON  --  0.26  --   --  0.75 0.55  LATICACIDVEN 1.6 2.5* 1.8 1.4  --   --     Recent Results (from the past 240 hour(s))  Resp Panel by RT-PCR (Flu A&B, Covid)     Status: None   Collection Time: 03/29/21  6:02 PM   Specimen: Nasopharyngeal(NP) swabs in vial transport medium  Result Value Ref Range Status   SARS Coronavirus 2 by RT PCR NEGATIVE NEGATIVE Final    Comment: (NOTE) SARS-CoV-2 target nucleic acids are NOT DETECTED.  The SARS-CoV-2 RNA is generally detectable in upper respiratory specimens during the acute phase of infection. The lowest concentration of SARS-CoV-2 viral copies this assay can detect is 138 copies/mL. A negative result does not preclude SARS-Cov-2 infection and should  not be used as the sole basis for treatment or other patient management decisions. A negative result may occur with  improper specimen collection/handling, submission of specimen other than nasopharyngeal swab, presence of viral mutation(s) within the areas targeted by this assay, and inadequate number of viral copies(<138 copies/mL). A negative result must be combined with clinical observations, patient history, and epidemiological information. The expected result is Negative.  Fact Sheet for Patients:  EntrepreneurPulse.com.au  Fact Sheet for Healthcare Providers:  IncredibleEmployment.be  This test is no t yet approved or cleared by the [place] FDA and  has been authorized for detection and/or diagnosis of SARS-CoV-2 by FDA under an Emergency Use Authorization (EUA). This EUA will remain  in effect (meaning this test can be used) for the duration of the COVID-19 declaration under Section 564(b)(1) of the Act, 21 U.S.C.section 360bbb-3(b)(1), unless the authorization is terminated  or revoked sooner.       Influenza A by PCR NEGATIVE NEGATIVE Final   Influenza B by PCR NEGATIVE NEGATIVE Final    Comment: (NOTE) The Xpert Xpress  SARS-CoV-2/FLU/RSV plus assay is intended as an aid in the diagnosis of influenza from Nasopharyngeal swab specimens and should not be used as a sole basis for treatment. Nasal washings and aspirates are unacceptable for Xpert Xpress SARS-CoV-2/FLU/RSV testing.  Fact Sheet for Patients: EntrepreneurPulse.com.au  Fact Sheet for Healthcare Providers: IncredibleEmployment.be  This test is not yet approved or cleared by the [place] FDA and has been authorized for detection and/or diagnosis of SARS-CoV-2 by FDA under an Emergency Use Authorization (EUA). This EUA will remain in effect (meaning this test can be used) for the duration of the COVID-19 declaration under Section 564(b)(1) of the Act, 21 U.S.C. section 360bbb-3(b)(1), unless the authorization is terminated or revoked.  Performed at [facility], [address] [place], [place] [address]   CULTURE, BLOOD (ROUTINE X 2) w Reflex to ID Panel     Status: None (Preliminary result)   Collection Time: 03/31/21  1:05 PM   Specimen: BLOOD  Result Value Ref Range Status   Specimen Description BLOOD LEFT AC  Final   Special Requests   Final    BOTTLES DRAWN AEROBIC AND ANAEROBIC Blood Culture results may not be optimal due to an excessive volume of blood received in culture bottles   Culture   Final    NO GROWTH 4 DAYS Performed at [facility], [address] [place], [place] [address]    Report Status PENDING  Incomplete  CULTURE, BLOOD (ROUTINE X 2) w Reflex to ID Panel     Status: None (Preliminary result)   Collection Time: 03/31/21  1:15 PM   Specimen: BLOOD  Result Value Ref Range Status   Specimen Description BLOOD RIGHT AC  Final   Special Requests   Final    BOTTLES DRAWN AEROBIC AND ANAEROBIC Blood Culture results may not be optimal due to an excessive volume of blood received in culture bottles   Culture   Final    NO GROWTH 4 DAYS Performed at [facility], [address] [place], [place] [address]    Report Status PENDING  Incomplete  MRSA PCR Screening     Status: Abnormal   Collection Time: 03/31/21  5:02 PM   Specimen: Nasopharyngeal  Result Value Ref Range Status   MRSA by PCR POSITIVE (A) NEGATIVE Final    Comment:        The GeneXpert MRSA Assay (FDA approved for  NASAL specimens only), is one component of a comprehensive MRSA colonization surveillance program. It is not intended to diagnose MRSA infection nor to guide or monitor treatment for MRSA infections. RESULT CALLED TO, READ BACK BY AND VERIFIED WITH: KRANCHESICA BELL at [place] on 03/31/2021 by CAF Performed at [facility], [address] [place], [place] [address]   Urine Culture     Status: Abnormal   Collection Time: 04/01/21 12:43 AM   Specimen: Urine, Random  Result Value Ref Range Status   Specimen Description   Final    URINE, RANDOM Performed at [facility], [address], [place], [place] [address]    Special Requests   Final    NONE Performed at [facility], [place], [place] [address]    Culture 10,000 COLONIES/mL PSEUDOMONAS AERUGINOSA (A)  Final   Report Status 04/03/2021 FINAL  Final   Organism ID, Bacteria PSEUDOMONAS AERUGINOSA (A)  Final      Susceptibility   Pseudomonas aeruginosa - MIC*    CEFTAZIDIME 4 SENSITIVE Sensitive     CIPROFLOXACIN <=0.25 SENSITIVE Sensitive     GENTAMICIN <=1 SENSITIVE Sensitive     IMIPENEM 1 SENSITIVE Sensitive     PIP/TAZO 8 SENSITIVE Sensitive     CEFEPIME 2 SENSITIVE Sensitive     * 10,000 COLONIES/mL PSEUDOMONAS AERUGINOSA         Radiology Studies: No results found.      Scheduled Meds: .  stroke: mapping our early stages of recovery book   Does not apply Once  . Chlorhexidine Gluconate Cloth  6 each Topical Q0600  . metoprolol succinate  50 mg Oral Q breakfast  . mupirocin ointment  1 application Nasal BID  . nystatin  5 mL  Oral QID  . rivaroxaban  20 mg Oral Daily  . sodium chloride flush  3 mL Intravenous Once   Continuous Infusions: . sodium chloride 100 mL/hr at 04/04/21 1532  . cefTAZidime (FORTAZ)  IV 200 mL/hr at 04/04/21 1532     LOS: 5 days    Time spent: 35 minutes.     Elmarie Shiley, MD [facility]   If 7PM-7AM, please contact night-coverage [email or web address]  04/04/2021, 4:09 PM

## 2021-04-04 NOTE — Progress Notes (Deleted)
Cardiology Office Note  Date:  04/04/2021   ID:  [name] A Brosky, DOB 11-05-1940, MRN [id number]  PCP:  [name], DO   No chief complaint on file.   HPI:  [name] is a 80 year old woman, patient of Dr. Wynetta Emery with history of labile blood pressure who presents for follow-up of her hypertension and chronic atrial fibrillation.   Leg infection, ulcers,venous insuff Went to the vein center, wound center Looking better  No regular exercise program, Tolerating her current medication regiment   blood pressure at home well controlled Continued problems with varicose veins  Weight up 8 pounds in 3 months, eating candy  EKG on today's visit shows atrial fibrillation, rate in the 64, no significant ST or T-wave changes  Other past medical history reviewed Echocardiogram done recently showing severely dilated left atrium, elevated right ventricular systolic pressure, normal ejection fraction.  Remote history of smoking, not for 30 years Husband died several years ago No regular exercise program Reports her blood pressure is labile, sometimes low at home  previous swelling on amlodpine  PMH:   has a past medical history of Cat bite of right lower leg ([id number]), Cellulitis of lower leg (12/07/2014), and Hypertension.  PSH:   No past surgical history on file.  No current facility-administered medications for this visit.   No current outpatient medications on file.   Facility-Administered Medications Ordered in Other Visits  Medication Dose Route Frequency Provider Last Rate Last Admin  .  stroke: mapping our early stages of recovery book   Does not apply Once Kathie Dike, MD      . 0.9 %  sodium chloride infusion   Intravenous Continuous Kathie Dike, MD 100 mL/hr at 04/04/21 1532 Infusion Verify at 04/04/21 1532  . acetaminophen (TYLENOL) tablet 650 mg  650 mg Oral Q6H PRN Kathie Dike, MD   650 mg at 04/02/21 0847   Or  . acetaminophen (TYLENOL) suppository 650  mg  650 mg Rectal Q6H PRN Kathie Dike, MD      . cefTAZidime (FORTAZ) 1 g in sodium chloride 0.9 % 100 mL IVPB  1 g Intravenous Q8H Regalado, Belkys A, MD 200 mL/hr at 04/04/21 1532 Infusion Verify at 04/04/21 1532  . Chlorhexidine Gluconate Cloth 2 % PADS 6 each  6 each Topical Q0600 Kathie Dike, MD   6 each at 04/04/21 1039  . hydrALAZINE (APRESOLINE) injection 10 mg  10 mg Intravenous Q6H PRN Kathie Dike, MD   10 mg at 04/03/21 2230  . LORazepam (ATIVAN) injection 1 mg  1 mg Intravenous Q2H PRN Kathie Dike, MD   1 mg at 04/03/21 2232  . metoprolol succinate (TOPROL-XL) 24 hr tablet 50 mg  50 mg Oral Q breakfast Kathie Dike, MD   50 mg at 04/04/21 0941  . mupirocin ointment (BACTROBAN) 2 % 1 application  1 application Nasal BID Kathie Dike, MD   1 application at 38/18/29 0943  . nystatin (MYCOSTATIN) 100000 UNIT/ML suspension 500,000 Units  5 mL Oral QID Regalado, Belkys A, MD   500,000 Units at 04/04/21 1501  . rivaroxaban (XARELTO) tablet 20 mg  20 mg Oral Daily Kathie Dike, MD   20 mg at 04/04/21 0941  . sodium chloride flush (NS) 0.9 % injection 3 mL  3 mL Intravenous Once Kathie Dike, MD         Allergies:   Amlodipine, Haldol [haloperidol lactate], and Hctz [hydrochlorothiazide]   Social History:  The patient  reports that she quit smoking about  42 years ago. She has never used smokeless tobacco. She reports that she does not drink alcohol and does not use drugs.   Family History:   family history includes Alcohol abuse in her brother and father; Heart disease in her father; Hyperlipidemia in her sister; Hypertension in her brother, father, mother, and sister.    Review of Systems: Review of Systems  Constitutional: Negative.   Respiratory: Negative.   Cardiovascular: Negative.   Gastrointestinal: Negative.   Musculoskeletal: Negative.   Neurological: Negative.   Psychiatric/Behavioral: Negative.   All other systems reviewed and are  negative.    PHYSICAL EXAM: VS:  There were no vitals taken for this visit. , BMI There is no height or weight on file to calculate BMI. GEN: Well nourished, well developed, in no acute distress  HEENT: normal  Neck: no JVD, carotid bruits, or masses Cardiac: RRR; no murmurs, rubs, or gallops,no edema  Respiratory:  clear to auscultation bilaterally, normal work of breathing GI: soft, nontender, nondistended, + BS MS: no deformity or atrophy  Skin: warm and dry, no rash, ulcerations on her lower extremities have healed, compression hose in place Neuro:  Strength and sensation are intact Psych: euthymic mood, full affect    Recent Labs: 03/29/2021: TSH 1.027 04/01/2021: ALT 11 04/04/2021: BUN 17; Creatinine, Ser 0.55; Hemoglobin 11.7; Platelets 314; Potassium 3.1; Sodium 140    Lipid Panel Lab Results  Component Value Date   CHOL 133 03/30/2021   HDL 50 03/30/2021   LDLCALC 73 03/30/2021   TRIG 50 03/30/2021      Wt Readings from Last 3 Encounters:  04/01/21 219 lb 5.7 oz (99.5 kg)  03/29/21 223 lb 3.2 oz (101.2 kg)  01/05/21 221 lb 12.8 oz (100.6 kg)       ASSESSMENT AND PLAN:  Chronic atrial fibrillation (HCC) - Plan: EKG 12-Lead Tolerating anticoagulation No changes to her medications, rate well controlled on metoprolol  Essential hypertension Pressure elevated on today's visit, recommended she take Lasix with potassium for high blood pressure sparingly 2-3 times per week. Previous echocardiogram showing moderately elevated right heart pressures. Recent issues with leg swelling, ulcerations (predominantly of vein  issue)   Chronic venous insufficiency  she is wearing compression hose, recommended leg elevation  Diuretic sparingly for high blood pressure, shortness of breath   Varicose veins of lower extremity with ulcer, unspecified laterality ([place])  recent seen by Dr.  Ronalee Belts and wound clinic   records reviewed with her Ulcerations have healed. Recommended  that she continue to wear her compression hose, leg elevation  Weight gain Poor diet, eating candy, recommended exercise program, dietary changes   Total encounter time more than 25 minutes  Greater than 50% was spent in counseling and coordination of care with the patient  Disposition:   F/U  12 months   No orders of the defined types were placed in this encounter.    Signed, Esmond Plants, M.D., Ph.D. 04/04/2021  [place], [place]

## 2021-04-04 NOTE — Progress Notes (Signed)
Physical Therapy Treatment Patient Details Name: [name] MRN: [id number] DOB: January 15, 1941 Today's Date: 04/04/2021    History of Present Illness Kaitrin Bugay is a 52yoF who [facility] on 5/26 with AMS. PMH: AF on BB and xarelto, venous stasis, HTN, memory impairment, cat bite cellulitis (2016). Brain MRI revealing of 8 mm acute infarction affecting the cortical surface of the deep insula on the left. Second subcentimeter acute cortical infarction in the left parietal lobe. Remote Lt occipital and Left frontal lobe infarcts.    PT Comments    Pt in bed upon entry, family at bedside, provides background info, home setup. Pt more calm, at baseline mentation which include orientation to self only, does not recognize family at bedside. Pt follows cues for bed level leg exercises to help with joint stiffness. Pain in ankles much improved, erythema appears to improve continually. ModA to EOB, cues for sitting upright, then can sit unsupported. Pt Requires modA for multiple STS, but SPT to recliner requires maxA and is met with a fair amount of anxiety, panic, impulsivity, and bowel incontinence onto bed, self, floor. Pt follows cues for participation well. Much weaker than baseline, but more limited by continued retropulsion in sitting and stance. Will upgrade frequency to QD treats.    Follow Up Recommendations  SNF;Supervision for mobility/OOB     Equipment Recommendations  None recommended by PT    Recommendations for Other Services       Precautions / Restrictions Precautions Precautions: Fall Restrictions Weight Bearing Restrictions: No    Mobility  Bed Mobility Overal bed mobility: Needs Assistance Bed Mobility: Supine to Sit     Supine to sit: HOB elevated;Mod assist     General bed mobility comments: posterior lean and weakness of BUE, easily exhausted, requires help    Transfers Overall transfer level: Needs assistance Equipment used: 1 person hand held  assist Transfers: Sit to/from [employer] Transfers Sit to Stand: Mod assist Stand pivot transfers: Max assist;+2 safety/equipment       General transfer comment: Able to rise to standing seevral times in session, difficulty with retropulsion. Able ot remain up for 3 minutes while RNx2 performs pericare;  Ambulation/Gait Ambulation/Gait assistance:  (unable due to disequilibrium)               Stairs             Wheelchair Mobility    Modified Rankin (Stroke Patients Only)       Balance Overall balance assessment: Modified Independent                                          Cognition Arousal/Alertness: Awake/alert Behavior During Therapy: WFL for tasks assessed/performed   Area of Impairment: Orientation                 Orientation Level: Person                    Exercises Other Exercises Other Exercises: STS from EOB: dependent style transfer modA 3x60sec, cues for trunk correction Other Exercises: supine heel slides 1x10 bilat, AA/rom Other Exercises: supine frontal plane heel slides AA/ROM    General Comments        Pertinent Vitals/Pain Pain Assessment: No/denies pain    Home Living Family/patient expects to be discharged to:: Private residence Living Arrangements: Alone;Children;Other relatives (Pt's DTR and SIL) Available Help  at Discharge: Family (Pt is alone for short spurts PTA) Type of Home: Mobile home Home Access: Stairs to enter Entrance Stairs-Rails: Right;Left Home Layout: One level Home Equipment: Cane - single point Additional Comments: SPC mostoly when she goes out, a household AMB; ~2-3 falls/closecalls.    Prior Function Level of Independence: Needs assistance  Gait / Transfers Assistance Needed: mostly household AMB; (wash push mowing yard up until 6 months prior) ADL's / [place] Needed: Independent with self care, but some concerns of self neglect for hygiene;     PT  Goals (current goals can now be found in the care plan section) Acute Rehab PT Goals Patient Stated Goal: regain baseline independence with ADL mobility PT Goal Formulation: With family Progress towards PT goals: Progressing toward goals    Frequency    7X/week      PT Plan Current plan remains appropriate;Frequency needs to be updated    Co-evaluation              AM-PAC PT "6 Clicks" Mobility   Outcome Measure  Help needed turning from your back to your side while in a flat bed without using bedrails?: A Lot Help needed moving from lying on your back to sitting on the side of a flat bed without using bedrails?: A Lot Help needed moving to and from a bed to a chair (including a wheelchair)?: Total Help needed standing up from a chair using your arms (e.g., wheelchair or bedside chair)?: A Lot Help needed to walk in hospital room?: Total Help needed climbing 3-5 steps with a railing? : Total 6 Click Score: 9    End of Session Equipment Utilized During Treatment: Gait belt Activity Tolerance: Patient tolerated treatment well;No increased pain Patient left: with nursing/sitter in room;in chair;with family/visitor present;with chair alarm set Nurse Communication: Mobility status PT Visit Diagnosis: Unsteadiness on feet (R26.81);Difficulty in walking, not elsewhere classified (R26.2);Muscle weakness (generalized) (M62.81)     Time: 7505-1833 PT Time Calculation (min) (ACUTE ONLY): 50 min  Charges:  $Neuromuscular Re-education: 38-52 mins                     12:19 PM, 04/04/21 Etta Grandchild, PT, DPT Physical Therapist - [facility]  [phone] ([place])    [place] 04/04/2021, 12:13 PM

## 2021-04-04 NOTE — TOC Progression Note (Signed)
Transition of Care [facility]) - Progression Note    Patient Details  Name: [name] MRN: [id number] Date of Birth: Jul 05, 1941  [facility]) CM/SW Contact  Shelbie Hutching, RN Phone Number: 04/04/2021, 3:29 PM  Clinical Narrative:     Daughter, [name], agrees with SNF for short term rehab.  Her preference is [place] reached out to [place]- they will consider patient for admission if she has no behavioral issues for 48 hours.  Patient has been calm and cooperative today.    Expected Discharge Plan: [place] Barriers to Discharge: Continued Medical Work up  Expected Discharge Plan and Services Expected Discharge Plan: [place]   Discharge Planning Services: CM Consult Post Acute Care Choice: [place]                                         Social Determinants of Health (SDOH) Interventions    Readmission Risk Interventions No flowsheet data found.

## 2021-04-04 NOTE — Progress Notes (Signed)
Unable to perform NIHSS due to patient's  LOC.

## 2021-04-05 ENCOUNTER — Ambulatory Visit: Payer: Medicare HMO | Admitting: Cardiovascular Disease

## 2021-04-05 DIAGNOSIS — I872 Venous insufficiency (chronic) (peripheral): Secondary | ICD-10-CM

## 2021-04-05 DIAGNOSIS — I4821 Permanent atrial fibrillation: Secondary | ICD-10-CM

## 2021-04-05 DIAGNOSIS — I10 Essential (primary) hypertension: Secondary | ICD-10-CM

## 2021-04-05 LAB — BASIC METABOLIC PANEL
Anion gap: 8 (ref 5–15)
BUN: 15 mg/dL (ref 8–23)
CO2: 24 mmol/L (ref 22–32)
Calcium: 8.2 mg/dL — ABNORMAL LOW (ref 8.9–10.3)
Chloride: 109 mmol/L (ref 98–111)
Creatinine, Ser: 0.59 mg/dL (ref 0.44–1.00)
GFR, Estimated: >60 mL/min (ref >60)
Glucose, Bld: 96 mg/dL (ref 70–99)
Potassium: 3.5 mmol/L (ref 3.5–5.1)
Sodium: 141 mmol/L (ref 135–145)

## 2021-04-05 NOTE — TOC Progression Note (Signed)
Transition of Care [facility]) - Progression Note    Patient Details  Name: KEMA SANTAELLA MRN: [id number] Date of Birth: 1941-07-19  Transition of Care [facility]) CM/SW Contact  Shelbie Hutching, RN Phone Number: 04/05/2021, 3:35 PM  Clinical Narrative:    [facility] has agreed to accept patient as long as her behavior remains consistent and not aggressive.  Magda Paganini at [place] will start [employer]  [name] has been updated today.   Expected Discharge Plan: [place] Barriers to Discharge: Continued Medical Work up  Expected Discharge Plan and Services Expected Discharge Plan: [place]   Discharge Planning Services: CM Consult Post Acute Care Choice: [place]                                         Social Determinants of Health (SDOH) Interventions    Readmission Risk Interventions No flowsheet data found.

## 2021-04-05 NOTE — Progress Notes (Signed)
Patient was hitting and did not want evening blood pressure and vitals taken.

## 2021-04-05 NOTE — Progress Notes (Addendum)
Physical Therapy Treatment Patient Details Name: [name] MRN: [id number] DOB: 01-31-1941 Today's Date: 04/05/2021    History of Present Illness [name] is a 63yoF who comes to [facility] on 5/26 with AMS. PMH: AF on BB and xarelto, venous stasis, HTN, memory impairment, cat bite cellulitis (2016). Brain MRI revealing of 8 mm acute infarction affecting the cortical surface of the deep insula on the left. Second subcentimeter acute cortical infarction in the left parietal lobe. Remote Lt occipital and Left frontal lobe infarcts.    PT Comments    Ready for session.  Sideways in bed.  Assisted to sitting with min a x 1 and cues.  Steady in sitting.  She remains sitting x 20 minutes.  Difficulty to direct session due to cognition.  Attempted to direct functional tasks and mobility "I'm ok"  Pt knows name/birthday/age but does not know where she is despite multiple attempts to re-orientate "Why does everyone keep asking me that?"  Unaware she is in a hospital, stated she is in [place]. Overall strength and mobility is improved and seemed she could have done more physically but cognition impaired progression.  Steady in sitting x 20 minutes.     Follow Up Recommendations  SNF;Supervision for mobility/OOB     Equipment Recommendations  None recommended by PT    Recommendations for Other Services       Precautions / Restrictions Precautions Precautions: Fall Restrictions Weight Bearing Restrictions: No    Mobility  Bed Mobility Overal bed mobility: Needs Assistance Bed Mobility: Supine to Sit     Supine to sit: Min assist Sit to supine: Min assist   General bed mobility comments: cognition limits mobility    Transfers Overall transfer level: Needs assistance   Transfers: Lateral/Scoot Transfers          Lateral/Scoot Transfers: Min assist Ambulation/Gait                 Stairs             Wheelchair Mobility    Modified Rankin (Stroke Patients Only)        Balance Overall balance assessment: Needs assistance Sitting-balance support: Feet supported Sitting balance-[name]: Good     Standing balance support: Bilateral upper extremity supported Standing balance-[name]: Zero                              Cognition Arousal/Alertness: Awake/alert Behavior During Therapy: WFL for tasks assessed/performed Overall Cognitive Status: Impaired/Different from baseline Area of Impairment: Orientation                 Orientation Level: Person             General Comments: knows name, age, birthday but did not know she was in [place] de[place] multiple re-orientations during session.      Exercises Other Exercises Other Exercises: seated and supine ex BLE x 10    General Comments        Pertinent Vitals/Pain Pain Assessment: No/denies pain    Home Living                      Prior Function            PT Goals (current goals can now be found in the care plan section) Progress towards PT goals: Progressing toward goals    Frequency    7X/week      PT Plan  Current plan remains appropriate;Frequency needs to be updated    Co-evaluation              AM-PAC PT "6 Clicks" Mobility   Outcome Measure  Help needed turning from your back to your side while in a flat bed without using bedrails?: A Little Help needed moving from lying on your back to sitting on the side of a flat bed without using bedrails?: A Little Help needed moving to and from a bed to a chair (including a wheelchair)?: A Lot Help needed standing up from a chair using your arms (e.g., wheelchair or bedside chair)?: A Lot Help needed to walk in hospital room?: A Lot Help needed climbing 3-5 steps with a railing? : Total 6 Click Score: 13    End of Session Equipment Utilized During Treatment: Gait belt Activity Tolerance: Patient tolerated treatment well;No increased pain Patient left: with nursing/sitter in  room;in chair;with family/visitor present;with chair alarm set Nurse Communication: Mobility status PT Visit Diagnosis: Unsteadiness on feet (R26.81);Difficulty in walking, not elsewhere classified (R26.2);Muscle weakness (generalized) (M62.81)     Time: 2811-8867 PT Time Calculation (min) (ACUTE ONLY): 23 min  Charges:  $Therapeutic Activity: 23-37 mins                    [name], PTA 04/05/21, 11:06 AM

## 2021-04-05 NOTE — Progress Notes (Addendum)
PROGRESS NOTE    [name]  AJG:[id number] DOB: Jun 24, 1941 DOA: 03/29/2021 PCP: [name], DO   Brief Narrative: 79 year old with past medical history significant for mild dementia, A. fib who has not been compliant with her anticoagulation admitted to the hospital with acute change in mental status.  MRI brain confirmed acute CVA.  She is being admitted for further work-up.  Hospital course complicated by development of neuroleptic malignant syndrome after receiving haloperidol for agitation.  Her medical condition has improved.  She is awaiting for skilled nursing facility placement   Assessment & Plan:   Active Problems:   HTN (hypertension)   Atrial fibrillation (HCC)   Chronic venous insufficiency   Delirium   Cellulitis   Acute CVA (cerebrovascular accident) ([place])   Neuroleptic malignant syndrome  1-Acute CVA;  Patient was admitted with change in mental status, she was increasingly confused.  MRI of the brain performed which show evidence of embolic infarcts. No compliant with anticoagulation. Started back on Xarelto. Hemoglobin A1c 5.5, LDL 73.  Neuroleptic malignant syndrome: Patient developed fever, diaphoresis and rigidity.  This was thought to be related to haloperidol.  Haloperidol has been discontinued.  Patient has improved clinically This has resolved. Stable.   Mild rhabdomyolysis: Received IV fluids.  Possible UTI:  urine culture growing 10K colonies of Pseudomonas.  Due to persistent delirium and she was treated with antibiotics plan to treat for 3 days  Permanent A. fib: Continue rate control with metoprolol Continue with Xarelto  Chronic venous stasis bilateral with possible underlying cellulitis, continue with Fortaz  Mild dementia: Needs follow-up with neurology on 6/8. B 12; 444.  Hypokalemia; resolved.   Estimated body mass index is 31.47 kg/m as calculated from the following:   Height as of this encounter: 5\' 10"  (1.778 m).   Weight  as of this encounter: 99.5 kg.   DVT prophylaxis: Xarelto  Code Status: Full Code.  Family Communication:Son and daughter in law at bedside 6/01 Disposition Plan:  Status is: Inpatient  Remains inpatient appropriate because:IV treatments appropriate due to intensity of illness or inability to take PO   Dispo: The patient is from: Home              Anticipated d/c is to: SNF              Patient currently is medically stable to d/c. awaiting placement. .    Difficult to place patient No        Consultants:   Neurology   Procedures:   None  Antimicrobials:  Fortaz  Subjective: She is sitting in recliner. No new complaints. pleasantly confuse.    Objective: Vitals:   04/05/21 0018 04/05/21 0431 04/05/21 0747 04/05/21 1117  BP: 124/90 (!) 142/76 (!) 158/92 (!) 150/86  Pulse: 78 73 65 68  Resp: 16 16 18 16   Temp: 98.3 F (36.8 C) 97.6 F (36.4 C) 98.5 F (36.9 C) 98.2 F (36.8 C)  TempSrc:   Oral   SpO2: 97% 96% 98% 94%  Weight:      Height:        Intake/Output Summary (Last 24 hours) at 04/05/2021 1549 Last data filed at 04/05/2021 1436 Gross per 24 hour  Intake 2289.96 ml  Output --  Net 2289.96 ml   Filed Weights   03/29/21 1119 03/31/21 1659 04/01/21 2200  Weight: 101 kg 99.5 kg 99.5 kg    Examination:  General exam: NAD Respiratory system: CTA Cardiovascular system: S 1, S 2  RRR Gastrointestinal system: BS present, soft, nt Central nervous system: alert Extremities: symmetric power   Data Reviewed: I have personally reviewed following labs and imaging studies  CBC: Recent Labs  Lab 03/30/21 0515 03/31/21 1305 04/02/21 0357 04/03/21 1418 04/04/21 0444  WBC 6.6 11.5* 11.1* 9.0 6.9  HGB 12.5 13.8 11.6* 13.0 11.7*  HCT 37.4 40.4 34.5* 37.7 34.2*  MCV 90.1 88.2 89.4 87.7 88.6  PLT 277 323 302 350 967   Basic Metabolic Panel: Recent Labs  Lab 04/01/21 0413 04/02/21 0357 04/03/21 1418 04/04/21 0444 04/05/21 0413  NA 141 141  138 140 141  K 3.4* 4.0 3.7 3.1* 3.5  CL 107 111 106 110 109  CO2 25 23 22 22 24   GLUCOSE 107* 105* 109* 93 96  BUN 22 21 13 17 15   CREATININE 0.83 0.68 0.57 0.55 0.59  CALCIUM 8.4* 8.1* 8.5* 8.0* 8.2*   GFR: Estimated Creatinine Clearance: 71.6 mL/min (by C-G formula based on SCr of 0.59 mg/dL). Liver Function Tests: Recent Labs  Lab 03/30/21 0515 03/31/21 1305 04/01/21 0413  AST 23 24 24   ALT 15 11 11   ALKPHOS 63 62 58  BILITOT 1.3* 1.5* 1.4*  PROT 6.3* 7.1 6.7  ALBUMIN 3.3* 3.5 3.1*   No results for input(s): LIPASE, AMYLASE in the last 168 hours. Recent Labs  Lab 03/29/21 2001  AMMONIA 14   Coagulation Profile: No results for input(s): INR, PROTIME in the last 168 hours. Cardiac Enzymes: Recent Labs  Lab 03/31/21 1504 04/01/21 0413 04/03/21 1418 04/04/21 0444  CKTOTAL 238* 269* 370* 304*   BNP (last 3 results) No results for input(s): PROBNP in the last 8760 hours. HbA1C: No results for input(s): HGBA1C in the last 72 hours. CBG: Recent Labs  Lab 03/31/21 1655  GLUCAP 124*   Lipid Profile: No results for input(s): CHOL, HDL, LDLCALC, TRIG, CHOLHDL, LDLDIRECT in the last 72 hours. Thyroid Function Tests: No results for input(s): TSH, T4TOTAL, FREET4, T3FREE, THYROIDAB in the last 72 hours. Anemia Panel: Recent Labs    04/04/21 1202  VITAMINB12 444   Sepsis Labs: Recent Labs  Lab 03/31/21 1305 03/31/21 1504 03/31/21 2045 03/31/21 2311 04/01/21 0413 04/02/21 0357  PROCALCITON  --  0.26  --   --  0.75 0.55  LATICACIDVEN 1.6 2.5* 1.8 1.4  --   --     Recent Results (from the past 240 hour(s))  Resp Panel by RT-PCR (Flu A&B, Covid)     Status: None   Collection Time: 03/29/21  6:02 PM   Specimen: Nasopharyngeal(NP) swabs in vial transport medium  Result Value Ref Range Status   SARS Coronavirus 2 by RT PCR NEGATIVE NEGATIVE Final    Comment: (NOTE) SARS-CoV-2 target nucleic acids are NOT DETECTED.  The SARS-CoV-2 RNA is generally  detectable in upper respiratory specimens during the acute phase of infection. The lowest concentration of SARS-CoV-2 viral copies this assay can detect is 138 copies/mL. A negative result does not preclude SARS-Cov-2 infection and should not be used as the sole basis for treatment or other patient management decisions. A negative result may occur with  improper specimen collection/handling, submission of specimen other than nasopharyngeal swab, presence of viral mutation(s) within the areas targeted by this assay, and inadequate number of viral copies(<138 copies/mL). A negative result must be combined with clinical observations, patient history, and epidemiological information. The expected result is Negative.  Fact Sheet for Patients:  EntrepreneurPulse.com.au  Fact Sheet for Healthcare Providers:  IncredibleEmployment.be  This test  is no t yet approved or cleared by the [place] and  has been authorized for detection and/or diagnosis of SARS-CoV-2 by FDA under an Emergency Use Authorization (EUA). This EUA will remain  in effect (meaning this test can be used) for the duration of the COVID-19 declaration under Section 564(b)(1) of the Act, 21 U.S.C.section 360bbb-3(b)(1), unless the authorization is terminated  or revoked sooner.       Influenza A by PCR NEGATIVE NEGATIVE Final   Influenza B by PCR NEGATIVE NEGATIVE Final    Comment: (NOTE) The Xpert Xpress SARS-CoV-2/FLU/RSV plus assay is intended as an aid in the diagnosis of influenza from Nasopharyngeal swab specimens and should not be used as a sole basis for treatment. Nasal washings and aspirates are unacceptable for Xpert Xpress SARS-CoV-2/FLU/RSV testing.  Fact Sheet for Patients: EntrepreneurPulse.com.au  Fact Sheet for Healthcare Providers: IncredibleEmployment.be  This test is not yet approved or cleared by the [place] FDA  and has been authorized for detection and/or diagnosis of SARS-CoV-2 by FDA under an Emergency Use Authorization (EUA). This EUA will remain in effect (meaning this test can be used) for the duration of the COVID-19 declaration under Section 564(b)(1) of the Act, 21 U.S.C. section 360bbb-3(b)(1), unless the authorization is terminated or revoked.  Performed at [facility], [address] [place], [place] [address]   CULTURE, BLOOD (ROUTINE X 2) w Reflex to ID Panel     Status: None (Preliminary result)   Collection Time: 03/31/21  1:05 PM   Specimen: BLOOD  Result Value Ref Range Status   Specimen Description BLOOD LEFT AC  Final   Special Requests   Final    BOTTLES DRAWN AEROBIC AND ANAEROBIC Blood Culture results may not be optimal due to an excessive volume of blood received in culture bottles   Culture   Final    NO GROWTH 4 DAYS Performed at [facility], [address], [place], [place] [address]    Report Status PENDING  Incomplete  CULTURE, BLOOD (ROUTINE X 2) w Reflex to ID Panel     Status: None (Preliminary result)   Collection Time: 03/31/21  1:15 PM   Specimen: BLOOD  Result Value Ref Range Status   Specimen Description BLOOD RIGHT AC  Final   Special Requests   Final    BOTTLES DRAWN AEROBIC AND ANAEROBIC Blood Culture results may not be optimal due to an excessive volume of blood received in culture bottles   Culture   Final    NO GROWTH 4 DAYS Performed at [facility], [address], [place], [place] [address]    Report Status PENDING  Incomplete  MRSA PCR Screening     Status: Abnormal   Collection Time: 03/31/21  5:02 PM   Specimen: Nasopharyngeal  Result Value Ref Range Status   MRSA by PCR POSITIVE (A) NEGATIVE Final    Comment:        The GeneXpert MRSA Assay (FDA approved for NASAL specimens only), is one component of a comprehensive MRSA colonization surveillance program. It is not intended to diagnose  MRSA infection nor to guide or monitor treatment for MRSA infections. RESULT CALLED TO, READ BACK BY AND VERIFIED WITH: KRANCHESICA BELL at [place] on 03/31/2021 by CAF Performed at [facility], [address] [place], [place] [address]   Urine Culture     Status: Abnormal   Collection Time: 04/01/21 12:43 AM   Specimen: Urine, Random  Result Value Ref Range  Status   Specimen Description   Final    URINE, RANDOM Performed at [facility], [address] [place], [place] [address]    Special Requests   Final    NONE Performed at [facility], [place], [place],  [address]    Culture 10,000 COLONIES/mL PSEUDOMONAS AERUGINOSA (A)  Final   Report Status 04/03/2021 FINAL  Final   Organism ID, Bacteria PSEUDOMONAS AERUGINOSA (A)  Final      Susceptibility   Pseudomonas aeruginosa - MIC*    CEFTAZIDIME 4 SENSITIVE Sensitive     CIPROFLOXACIN <=0.25 SENSITIVE Sensitive     GENTAMICIN <=1 SENSITIVE Sensitive     IMIPENEM 1 SENSITIVE Sensitive     PIP/TAZO 8 SENSITIVE Sensitive     CEFEPIME 2 SENSITIVE Sensitive     * 10,000 COLONIES/mL PSEUDOMONAS AERUGINOSA         Radiology Studies: No results found.      Scheduled Meds: .  stroke: mapping our early stages of recovery book   Does not apply Once  . metoprolol succinate  50 mg Oral Q breakfast  . nystatin  5 mL Oral QID  . rivaroxaban  20 mg Oral Daily  . sodium chloride flush  3 mL Intravenous Once   Continuous Infusions: . sodium chloride 100 mL/hr at 04/05/21 1436  . cefTAZidime (FORTAZ)  IV 1 g (04/05/21 1247)     LOS: 6 days    Time spent: 35 minutes.     Elmarie Shiley, MD [facility]   If 7PM-7AM, please contact night-coverage [email or web address]  04/05/2021, 3:49 PM

## 2021-04-06 ENCOUNTER — Encounter: Payer: Self-pay | Admitting: Cardiovascular Disease

## 2021-04-06 MED ORDER — LORAZEPAM 1 MG PO TABS
1.0000 mg | ORAL_TABLET | Freq: Four times a day (QID) | ORAL | Status: DC | PRN
  Administered 2021-04-06 – 2021-04-12 (×13): 1 mg via ORAL
  Filled 2021-04-06 (×15): qty 1

## 2021-04-06 MED ORDER — DOXYCYCLINE HYCLATE 100 MG PO TABS
100.0000 mg | ORAL_TABLET | Freq: Two times a day (BID) | ORAL | Status: AC
  Administered 2021-04-06 – 2021-04-08 (×6): 100 mg via ORAL
  Filled 2021-04-06 (×6): qty 1

## 2021-04-06 NOTE — Progress Notes (Signed)
Occupational Therapy Treatment Patient Details Name: [name] MRN: [id number] DOB: 1940-12-22 Today's Date: 04/06/2021    History of present illness [name] is a 33yoF who comes [facility] on 5/26 with AMS. PMH: AF on BB and xarelto, venous stasis, HTN, memory impairment, cat bite cellulitis (2016). Brain MRI revealing of 8 mm acute infarction affecting the cortical surface of the deep insula on the left. Second subcentimeter acute cortical infarction in the left parietal lobe. Remote Lt occipital and Left frontal lobe infarcts.   OT comments  [name] was seen for OT treatment on this date, pt pleasant t/o session. Upon arrival to room pt reclined in bed, agreeable to tx. Pt A&Ox1, self only - following multiple attempts to orient pt able to state [place] as location. Pt requires SETUP + MIN VCs self-feeding at bed level. When asked if pt had a banana or an apple on her tray pt unable to state. When handed the banana pt able to name it appropriately.   Pt requires MIN A for bed mobility. MIN A grooming seated EOB - assist for R lateral lean. Pt tolerates ~10 mins sitting EOB. Pt intermittently dropping medium sized objects in hand (deodorant) and requires cues for unfamiliar grooming items (roll on deodorant) but no cues for familiar item (comb). MOD cues to manipulate deck of cards, unable to [place] for flipping cards and drops full deck if attempt to shuffle. Accomplished picking up cards indivdually with R hand. MAX A sit<>stand. Pt making good progress toward goals. Pt continues to benefit from skilled OT services to maximize return to PLOF and minimize risk of future falls, injury, caregiver burden, and readmission. Will continue to follow POC. Discharge recommendation remains appropriate.    Follow Up Recommendations  SNF;Supervision/Assistance - 24 hour    Equipment Recommendations  Other (comment) (defer)    Recommendations for Other Services      Precautions / Restrictions  Precautions Precautions: Fall Restrictions Weight Bearing Restrictions: No       Mobility Bed Mobility Overal bed mobility: Needs Assistance Bed Mobility: Supine to Sit;Sit to Supine     Supine to sit: Min assist Sit to supine: Min assist        Transfers Overall transfer level: Needs assistance Equipment used: 1 person hand held assist Transfers: Sit to/from Stand;Lateral/Scoot Transfers Sit to Stand: Max assist        Lateral/Scoot Transfers: Min assist General transfer comment: MAX A to clear rear, toelrates ~15 sec standing EOB    Balance Overall balance assessment: Needs assistance Sitting-balance support: Feet supported Sitting balance-[name]: Good     Standing balance support: Bilateral upper extremity supported Standing balance-[name]: Poor                             ADL either performed or assessed with clinical judgement   ADL Overall ADL's : Needs assistance/impaired                                       General ADL Comments: SETUP + MIN VCs self-feeding at bed level. When asked if pt had a banana or an apple on her tray pt unable to state. When handed the banana pt able to name it appropriately. MIN A grooming seated EOB - assist for R lateral lean. Pt intermittently dropping larger objects in hand (deodorant)  and requires cues for unfamiliar grooming items (roll on deodorant) but no cues for familiar item (comb). MOD cues to manipulate deck of cards, unable to [place] for flipping cards and drops full deck if attempt to shuffle. Accomplished picking up cards indivdually with R hand. MAX A for ADL t/f.               Cognition Arousal/Alertness: Awake/alert Behavior During Therapy: WFL for tasks assessed/performed Overall Cognitive Status: Impaired/Different from baseline Area of Impairment: Orientation                 Orientation Level: Person             General Comments: multiple  attempts to orient, pt able to state [place] as location after being told twice however unable to state time or situation        Exercises Exercises: Other exercises Other Exercises Other Exercises: Pt educated re: OT role, d/c recs, falls prevention Other Exercises: Grooming, sup<>sit, sit<>stand, lateral scoot, sitting balance/tolerance           Pertinent Vitals/ Pain       Pain Assessment: No/denies pain         Frequency  Min 2X/week        Progress Toward Goals  OT Goals(current goals can now be found in the care plan section)  Progress towards OT goals: Progressing toward goals  Acute Rehab OT Goals Patient Stated Goal: regain baseline independence with ADL mobility OT Goal Formulation: Patient unable to participate in goal setting Time For Goal Achievement: 04/17/21 Potential to Achieve Goals: Fair ADL Goals Pt Will Perform Grooming: with supervision;sitting Pt Will Perform Lower Body Dressing: with min assist;sit to/from stand Pt Will Transfer to Toilet: with min assist;ambulating Pt Will Perform Toileting - Clothing Manipulation and hygiene: with min assist;sit to/from stand  Plan Discharge plan remains appropriate;Frequency remains appropriate       AM-PAC OT "6 Clicks" Daily Activity     Outcome Measure   Help from another person eating meals?: A Little Help from another person taking care of personal grooming?: A Little Help from another person toileting, which includes using toliet, bedpan, or urinal?: A Lot Help from another person bathing (including washing, rinsing, drying)?: A Lot Help from another person to put on and taking off regular upper body clothing?: A Lot Help from another person to put on and taking off regular lower body clothing?: A Lot 6 Click Score: 14    End of Session    OT Visit Diagnosis: Unsteadiness on feet (R26.81);Muscle weakness (generalized) (M62.81)   Activity Tolerance Patient tolerated treatment well;Other  (comment) (Pt limited by cognition)   Patient Left in bed;with call bell/phone within reach;with bed alarm set   Nurse Communication Mobility status        Time: 8850-2774 OT Time Calculation (min): 24 min  Charges: OT General Charges $OT Visit: 1 Visit OT Treatments $Self Care/Home Management : 8-22 mins $Therapeutic Activity: 8-22 mins  Dessie Coma, M.S. OTR/L  04/06/21, 9:34 AM  ascom [phone]

## 2021-04-06 NOTE — Progress Notes (Signed)
Physical Therapy Treatment Patient Details Name: [name] MRN: [id number] DOB: May 07, 1941 Today's Date: 04/06/2021    History of Present Illness [name] is a 71yoF who comes to [facility] on 5/26 with AMS. PMH: AF on BB and xarelto, venous stasis, HTN, memory impairment, cat bite cellulitis (2016). Brain MRI revealing of 8 mm acute infarction affecting the cortical surface of the deep insula on the left. Second subcentimeter acute cortical infarction in the left parietal lobe. Remote Lt occipital and Left frontal lobe infarcts.    PT Comments    Pt asleep in bed upon arrival, awakens to voice, participates with encouragement but remains drowsy and tired appearing. Pt struggling with follow commands at this point in day, likely somnolence to blame given success with OT earlier. Pt remains with disequilibrium to gravity, maintains heavy posterior lean, very difficult to  Correct with cues, only improved when cued to reach socks, which takes ~60seconds of slow forward lean per sock. Pt sits at EOB for most of session, but asks to return to sleep a few times, clearly exhausted. Pt returned to supine at EOB, secretary made aware that pt is moving bowels in bed, needs help with cleanup.    Follow Up Recommendations  SNF;Supervision for mobility/OOB     Equipment Recommendations  None recommended by PT    Recommendations for Other Services       Precautions / Restrictions Precautions Precautions: Fall Restrictions Weight Bearing Restrictions: No    Mobility  Bed Mobility Overal bed mobility: Needs Assistance       Supine to sit: +2 for physical assistance;Max assist Sit to supine: +2 for physical assistance;Max assist   General bed mobility comments: cognition limits mobility    Transfers Overall transfer level:  (deferred due to somnolence, fatgiue, weakness)                  Ambulation/Gait                 Stairs             Wheelchair Mobility     Modified Rankin (Stroke Patients Only)       Balance Overall balance assessment: Needs assistance Sitting-balance support: Single extremity supported;Feet supported Sitting balance-[name]: Poor       Standing balance-[name]: Zero                              Cognition Arousal/Alertness: Lethargic   Overall Cognitive Status: History of cognitive impairments - at baseline Area of Impairment: Following commands                       Following Commands: Follows one step commands inconsistently              Exercises Other Exercises Other Exercises: pulling up socks, foward trunk ROM in sagittal plane AA/ROM, rolling in bed    General Comments        Pertinent Vitals/Pain Pain Assessment: Faces Faces Pain Scale: Hurts even more Pain Location: intermittent leg pain with end range knee flexion Pain Intervention(s): Limited activity within patient's tolerance;Repositioned    Home Living                      Prior Function            PT Goals (current goals can now be found in the care plan section) Acute Rehab  PT Goals Patient Stated Goal: regain baseline independence with ADL mobility PT Goal Formulation: With family Progress towards PT goals: Not progressing toward goals - comment    Frequency    Min 2X/week      PT Plan Current plan remains appropriate;Frequency needs to be updated    Co-evaluation              AM-PAC PT "6 Clicks" Mobility   Outcome Measure  Help needed turning from your back to your side while in a flat bed without using bedrails?: Total Help needed moving from lying on your back to sitting on the side of a flat bed without using bedrails?: Total Help needed moving to and from a bed to a chair (including a wheelchair)?: Total Help needed standing up from a chair using your arms (e.g., wheelchair or bedside chair)?: Total Help needed to walk in hospital room?: Total Help needed  climbing 3-5 steps with a railing? : Total 6 Click Score: 6    End of Session   Activity Tolerance: Patient limited by fatigue;Patient limited by lethargy Patient left: in bed;with bed alarm set;with nursing/sitter in room (MD in room) Nurse Communication: Mobility status PT Visit Diagnosis: Unsteadiness on feet (R26.81);Difficulty in walking, not elsewhere classified (R26.2);Muscle weakness (generalized) (M62.81)     Time: 6160-7371 PT Time Calculation (min) (ACUTE ONLY): 17 min  Charges:  $Neuromuscular Re-education: 8-22 mins                    2:18 PM, 04/06/21 Etta Grandchild, PT, DPT Physical Therapist - [facility]  [phone] ([place])   Section C 04/06/2021, 2:07 PM

## 2021-04-06 NOTE — TOC Progression Note (Signed)
Transition of Care [facility]) - Progression Note    Patient Details  Name: [name] MRN: [id number] Date of Birth: [birth year]  Transition of Care [facility]) CM/SW Contact  Shelbie Hutching, RN Phone Number: 04/06/2021, 12:30 PM  Clinical Narrative:    [name] is unable to offer a bed.  [name] has decided on [facility] since [facility] is unable to offer a bed.  [name] at [facility] will start [employer].     Expected Discharge Plan: [place] Barriers to Discharge: Continued Medical Work up  Expected Discharge Plan and Services Expected Discharge Plan: [place]   Discharge Planning Services: CM Consult Post Acute Care Choice: [place]                                         Social Determinants of Health (SDOH) Interventions    Readmission Risk Interventions No flowsheet data found.

## 2021-04-06 NOTE — Progress Notes (Signed)
PROGRESS NOTE    [name]  DJS:[id number] DOB: 1941/09/16 DOA: 03/29/2021 PCP: [name], DO   Brief Narrative: 80 year old with past medical history significant for mild dementia, A. fib who has not been compliant with her anticoagulation admitted to the hospital with acute change in mental status.  MRI brain confirmed acute CVA.  She is being admitted for further work-up.  Hospital course complicated by development of neuroleptic malignant syndrome after receiving haloperidol for agitation.  Her medical condition has improved.  She is awaiting for skilled nursing facility placement   Assessment & Plan:   Active Problems:   HTN (hypertension)   Atrial fibrillation (HCC)   Chronic venous insufficiency   Delirium   Cellulitis   Acute CVA (cerebrovascular accident) ([place])   Neuroleptic malignant syndrome  1-Acute CVA;  Patient was admitted with change in mental status, she was increasingly confused.  MRI of the brain performed which show evidence of embolic infarcts. No compliant with anticoagulation. Started back on Xarelto. Hemoglobin A1c 5.5, LDL 73.  Neuroleptic malignant syndrome: Patient developed fever, diaphoresis and rigidity.  This was thought to be related to haloperidol.  Haloperidol has been discontinued.  Patient has improved clinically This has resolved. Stable.   Mild rhabdomyolysis: Received IV fluids.  Possible UTI:  urine culture growing 10K colonies of Pseudomonas.  Due to persistent delirium and she was treated with antibiotics plan to treat for 3 days  Permanent A. fib: Continue rate control with metoprolol Continue with Xarelto  Chronic venous stasis bilateral with possible underlying cellulitis, continue with Fortaz  Mild dementia: Needs follow-up with neurology on 6/8. B 12; 444.  Hypokalemia; resolved.   Estimated body mass index is 31.47 kg/m as calculated from the following:   Height as of this encounter: 5\' 10"  (1.778 m).   Weight  as of this encounter: 99.5 kg.   DVT prophylaxis: Xarelto  Code Status: Full Code.  Family Communication:Son and daughter in law at bedside 6/01 Disposition Plan:  Status is: Inpatient  Remains inpatient appropriate because:IV treatments appropriate due to intensity of illness or inability to take PO   Dispo: The patient is from: Home              Anticipated d/c is to: SNF              Patient currently is medically stable to d/c. awaiting placement. .    Difficult to place patient No        Consultants:   Neurology   Procedures:   None  Antimicrobials:  Fortaz  Subjective: She received ativan yesterday for mild agitation. She appears clam today, pleasantly confuse.    Objective: Vitals:   04/06/21 0606 04/06/21 0735 04/06/21 1130 04/06/21 1131  BP: (!) 170/98 (!) 151/75 (!) 120/108 (!) 123/97  Pulse: 74 87 83   Resp: 20 18 18    Temp: 98.2 F (36.8 C) 97.9 F (36.6 C) 97.8 F (36.6 C)   TempSrc:      SpO2: 96% 97% 95%   Weight:      Height:       No intake or output data in the 24 hours ending 04/06/21 1457 Filed Weights   03/29/21 1119 03/31/21 1659 04/01/21 2200  Weight: 101 kg 99.5 kg 99.5 kg    Examination:  General exam: NAD Respiratory system: CTA Cardiovascular system: S 1, S 2 RRR Gastrointestinal system: BS present, soft, nt Central nervous system: Alert Extremities: symmetric power   Data Reviewed: I  have personally reviewed following labs and imaging studies  CBC: Recent Labs  Lab 03/31/21 1305 04/02/21 0357 04/03/21 1418 04/04/21 0444  WBC 11.5* 11.1* 9.0 6.9  HGB 13.8 11.6* 13.0 11.7*  HCT 40.4 34.5* 37.7 34.2*  MCV 88.2 89.4 87.7 88.6  PLT 323 302 350 970   Basic Metabolic Panel: Recent Labs  Lab 04/01/21 0413 04/02/21 0357 04/03/21 1418 04/04/21 0444 04/05/21 0413  NA 141 141 138 140 141  K 3.4* 4.0 3.7 3.1* 3.5  CL 107 111 106 110 109  CO2 25 23 22 22 24   GLUCOSE 107* 105* 109* 93 96  BUN 22 21 13 17 15    CREATININE 0.83 0.68 0.57 0.55 0.59  CALCIUM 8.4* 8.1* 8.5* 8.0* 8.2*   GFR: Estimated Creatinine Clearance: 71.6 mL/min (by C-G formula based on SCr of 0.59 mg/dL). Liver Function Tests: Recent Labs  Lab 03/31/21 1305 04/01/21 0413  AST 24 24  ALT 11 11  ALKPHOS 62 58  BILITOT 1.5* 1.4*  PROT 7.1 6.7  ALBUMIN 3.5 3.1*   No results for input(s): LIPASE, AMYLASE in the last 168 hours. No results for input(s): AMMONIA in the last 168 hours. Coagulation Profile: No results for input(s): INR, PROTIME in the last 168 hours. Cardiac Enzymes: Recent Labs  Lab 03/31/21 1504 04/01/21 0413 04/03/21 1418 04/04/21 0444  CKTOTAL 238* 269* 370* 304*   BNP (last 3 results) No results for input(s): PROBNP in the last 8760 hours. HbA1C: No results for input(s): HGBA1C in the last 72 hours. CBG: Recent Labs  Lab 03/31/21 1655  GLUCAP 124*   Lipid Profile: No results for input(s): CHOL, HDL, LDLCALC, TRIG, CHOLHDL, LDLDIRECT in the last 72 hours. Thyroid Function Tests: No results for input(s): TSH, T4TOTAL, FREET4, T3FREE, THYROIDAB in the last 72 hours. Anemia Panel: Recent Labs    04/04/21 1202  VITAMINB12 444   Sepsis Labs: Recent Labs  Lab 03/31/21 1305 03/31/21 1504 03/31/21 2045 03/31/21 2311 04/01/21 0413 04/02/21 0357  PROCALCITON  --  0.26  --   --  0.75 0.55  LATICACIDVEN 1.6 2.5* 1.8 1.4  --   --     Recent Results (from the past 240 hour(s))  Resp Panel by RT-PCR (Flu A&B, Covid)     Status: None   Collection Time: 03/29/21  6:02 PM   Specimen: Nasopharyngeal(NP) swabs in vial transport medium  Result Value Ref Range Status   SARS Coronavirus 2 by RT PCR NEGATIVE NEGATIVE Final    Comment: (NOTE) SARS-CoV-2 target nucleic acids are NOT DETECTED.  The SARS-CoV-2 RNA is generally detectable in upper respiratory specimens during the acute phase of infection. The lowest concentration of SARS-CoV-2 viral copies this assay can detect is 138 copies/mL.  A negative result does not preclude SARS-Cov-2 infection and should not be used as the sole basis for treatment or other patient management decisions. A negative result may occur with  improper specimen collection/handling, submission of specimen other than nasopharyngeal swab, presence of viral mutation(s) within the areas targeted by this assay, and inadequate number of viral copies(<138 copies/mL). A negative result must be combined with clinical observations, patient history, and epidemiological information. The expected result is Negative.  Fact Sheet for Patients:  EntrepreneurPulse.com.au  Fact Sheet for Healthcare Providers:  IncredibleEmployment.be  This test is no t yet approved or cleared by the [place] FDA and  has been authorized for detection and/or diagnosis of SARS-CoV-2 by FDA under an Emergency Use Authorization (EUA). This EUA will remain  in effect (meaning this test can be used) for the duration of the COVID-19 declaration under Section 564(b)(1) of the Act, 21 U.S.C.section 360bbb-3(b)(1), unless the authorization is terminated  or revoked sooner.       Influenza A by PCR NEGATIVE NEGATIVE Final   Influenza B by PCR NEGATIVE NEGATIVE Final    Comment: (NOTE) The Xpert Xpress SARS-CoV-2/FLU/RSV plus assay is intended as an aid in the diagnosis of influenza from Nasopharyngeal swab specimens and should not be used as a sole basis for treatment. Nasal washings and aspirates are unacceptable for Xpert Xpress SARS-CoV-2/FLU/RSV testing.  Fact Sheet for Patients: EntrepreneurPulse.com.au  Fact Sheet for Healthcare Providers: IncredibleEmployment.be  This test is not yet approved or cleared by the [place] FDA and has been authorized for detection and/or diagnosis of SARS-CoV-2 by FDA under an Emergency Use Authorization (EUA). This EUA will remain in effect (meaning this test  can be used) for the duration of the COVID-19 declaration under Section 564(b)(1) of the Act, 21 U.S.C. section 360bbb-3(b)(1), unless the authorization is terminated or revoked.  Performed at [facility], [address] [place], [place] [address]   CULTURE, BLOOD (ROUTINE X 2) w Reflex to ID Panel     Status: None (Preliminary result)   Collection Time: 03/31/21  1:05 PM   Specimen: BLOOD  Result Value Ref Range Status   Specimen Description BLOOD LEFT AC  Final   Special Requests   Final    BOTTLES DRAWN AEROBIC AND ANAEROBIC Blood Culture results may not be optimal due to an excessive volume of blood received in culture bottles   Culture   Final    NO GROWTH 4 DAYS Performed at [facility], [address], [place], [place] [address]    Report Status PENDING  Incomplete  CULTURE, BLOOD (ROUTINE X 2) w Reflex to ID Panel     Status: None (Preliminary result)   Collection Time: 03/31/21  1:15 PM   Specimen: BLOOD  Result Value Ref Range Status   Specimen Description BLOOD RIGHT AC  Final   Special Requests   Final    BOTTLES DRAWN AEROBIC AND ANAEROBIC Blood Culture results may not be optimal due to an excessive volume of blood received in culture bottles   Culture   Final    NO GROWTH 4 DAYS Performed at [facility], [address], [place], [place] [address]    Report Status PENDING  Incomplete  MRSA PCR Screening     Status: Abnormal   Collection Time: 03/31/21  5:02 PM   Specimen: Nasopharyngeal  Result Value Ref Range Status   MRSA by PCR POSITIVE (A) NEGATIVE Final    Comment:        The GeneXpert MRSA Assay (FDA approved for NASAL specimens only), is one component of a comprehensive MRSA colonization surveillance program. It is not intended to diagnose MRSA infection nor to guide or monitor treatment for MRSA infections. RESULT CALLED TO, READ BACK BY AND VERIFIED WITH: KRANCHESICA BELL at [place] on 03/31/2021 by CAF Performed at  [facility], [address] [place], [place] [address]   Urine Culture     Status: Abnormal   Collection Time: 04/01/21 12:43 AM   Specimen: Urine, Random  Result Value Ref Range Status   Specimen Description   Final    URINE, RANDOM Performed at [facility], [address] [place], [place] [address]    Special Requests   Final  NONE Performed at [facility], [place], [place] [address]    Culture 10,000 COLONIES/mL PSEUDOMONAS AERUGINOSA (A)  Final   Report Status 04/03/2021 FINAL  Final   Organism ID, Bacteria PSEUDOMONAS AERUGINOSA (A)  Final      Susceptibility   Pseudomonas aeruginosa - MIC*    CEFTAZIDIME 4 SENSITIVE Sensitive     CIPROFLOXACIN <=0.25 SENSITIVE Sensitive     GENTAMICIN <=1 SENSITIVE Sensitive     IMIPENEM 1 SENSITIVE Sensitive     PIP/TAZO 8 SENSITIVE Sensitive     CEFEPIME 2 SENSITIVE Sensitive     * 10,000 COLONIES/mL PSEUDOMONAS AERUGINOSA         Radiology Studies: No results found.      Scheduled Meds: .  stroke: mapping our early stages of recovery book   Does not apply Once  . doxycycline  100 mg Oral Q12H  . metoprolol succinate  50 mg Oral Q breakfast  . nystatin  5 mL Oral QID  . rivaroxaban  20 mg Oral Daily  . sodium chloride flush  3 mL Intravenous Once   Continuous Infusions:    LOS: 7 days    Time spent: 35 minutes.     Elmarie Shiley, MD [facility]   If 7PM-7AM, please contact night-coverage [email or web address]  04/06/2021, 2:57 PM

## 2021-04-06 NOTE — Care Management Important Message (Signed)
Important Message  Patient Details  Name: [name] MRN: [id number] Date of Birth: 10-18-1941   Medicare Important Message Given:  Yes     [name] A Chinelo Benn 04/06/2021, 10:20 AM

## 2021-04-07 LAB — CBC
HCT: 36.4 % (ref 36.0–46.0)
Hemoglobin: 12 g/dL (ref 12.0–15.0)
MCH: 29.6 pg (ref 26.0–34.0)
MCHC: 33 g/dL (ref 30.0–36.0)
MCV: 89.9 fL (ref 80.0–100.0)
Platelets: 399 10*3/uL (ref 150–400)
RBC: 4.05 MIL/uL (ref 3.87–5.11)
RDW: 12.8 % (ref 11.5–15.5)
WBC: 7.7 10*3/uL (ref 4.0–10.5)
nRBC: 0 % (ref 0.0–0.2)

## 2021-04-07 LAB — CULTURE, BLOOD (ROUTINE X 2)
Culture: NO GROWTH
Culture: NO GROWTH

## 2021-04-07 NOTE — Progress Notes (Signed)
PROGRESS NOTE    [name]  WUJ:[id number] DOB: December 08, 1940 DOA: 03/29/2021 PCP: [name], DO   Brief Narrative: 80 year old with past medical history significant for mild dementia, A. fib who has not been compliant with her anticoagulation admitted to the hospital with acute change in mental status.  MRI brain confirmed acute CVA.  She is being admitted for further work-up.  Hospital course complicated by development of neuroleptic malignant syndrome after receiving haloperidol for agitation.  Her medical condition has improved.  She is awaiting for skilled nursing facility placement.   Assessment & Plan:   Active Problems:   HTN (hypertension)   Atrial fibrillation (HCC)   Chronic venous insufficiency   Delirium   Cellulitis   Acute CVA (cerebrovascular accident) ([place])   Neuroleptic malignant syndrome  1-Acute CVA;  Patient was admitted with change in mental status, she was increasingly confused.  MRI of the brain performed which show evidence of embolic infarcts. No compliant with anticoagulation. Continue with Xarelto.  Hemoglobin A1c 5.5, LDL 73.  Neuroleptic malignant syndrome: Patient developed fever, diaphoresis and rigidity.  This was thought to be related to haloperidol.  Haloperidol has been discontinued.  Patient has improved clinically This has resolved.   Mild rhabdomyolysis: Received IV fluids.  Possible UTI:  urine culture growing 10K colonies of Pseudomonas.  Due to persistent delirium and she was treated with antibiotics plan to treat for 3 days  Permanent A. fib: Continue rate control with metoprolol Continue with Xarelto  Chronic venous stasis bilateral with possible underlying cellulitis, Started on Doxy for 3 more days antibiotics.   Mild dementia: Needs follow-up with neurology on 6/8. B 12; 444.  Hypokalemia; resolved.   Estimated body mass index is 31.47 kg/m as calculated from the following:   Height as of this encounter: 5\' 10"   (1.778 m).   Weight as of this encounter: 99.5 kg.   DVT prophylaxis: Xarelto  Code Status: Full Code.  Family Communication:Son and daughter in law at bedside 6/01 Disposition Plan:  Status is: Inpatient  Remains inpatient appropriate because:IV treatments appropriate due to intensity of illness or inability to take PO   Dispo: The patient is from: Home              Anticipated d/c is to: SNF              Patient currently is medically stable to d/c. awaiting placement. .    Difficult to place patient No        Consultants:   Neurology   Procedures:   None  Antimicrobials:  Fortaz  Subjective: She has remain calm, no agitation today. pleasantly confuse  Objective: Vitals:   04/07/21 0538 04/07/21 0848 04/07/21 1211 04/07/21 1520  BP: (!) 145/67 (!) 163/81 135/79 (!) 145/66  Pulse: 61 65 63 68  Resp: 16 17 15 15   Temp: 98 F (36.7 C) 98.1 F (36.7 C) 98 F (36.7 C) 97.9 F (36.6 C)  TempSrc:  Oral Oral Oral  SpO2: 97% 97% 98% 96%  Weight:      Height:       No intake or output data in the 24 hours ending 04/07/21 1539 Filed Weights   03/29/21 1119 03/31/21 1659 04/01/21 2200  Weight: 101 kg 99.5 kg 99.5 kg    Examination:  General exam: NAD Respiratory system: CTA Cardiovascular system: S 1, S 2 IRR Gastrointestinal system: BS present, soft, nt Central nervous system: Alert Extremities: Symmetric power   Data Reviewed:  I have personally reviewed following labs and imaging studies  CBC: Recent Labs  Lab 04/02/21 0357 04/03/21 1418 04/04/21 0444 04/07/21 0447  WBC 11.1* 9.0 6.9 7.7  HGB 11.6* 13.0 11.7* 12.0  HCT 34.5* 37.7 34.2* 36.4  MCV 89.4 87.7 88.6 89.9  PLT 302 350 314 188   Basic Metabolic Panel: Recent Labs  Lab 04/01/21 0413 04/02/21 0357 04/03/21 1418 04/04/21 0444 04/05/21 0413  NA 141 141 138 140 141  K 3.4* 4.0 3.7 3.1* 3.5  CL 107 111 106 110 109  CO2 25 23 22 22 24   GLUCOSE 107* 105* 109* 93 96  BUN 22 21  13 17 15   CREATININE 0.83 0.68 0.57 0.55 0.59  CALCIUM 8.4* 8.1* 8.5* 8.0* 8.2*   GFR: Estimated Creatinine Clearance: 71.6 mL/min (by C-G formula based on SCr of 0.59 mg/dL). Liver Function Tests: Recent Labs  Lab 04/01/21 0413  AST 24  ALT 11  ALKPHOS 58  BILITOT 1.4*  PROT 6.7  ALBUMIN 3.1*   No results for input(s): LIPASE, AMYLASE in the last 168 hours. No results for input(s): AMMONIA in the last 168 hours. Coagulation Profile: No results for input(s): INR, PROTIME in the last 168 hours. Cardiac Enzymes: Recent Labs  Lab 04/01/21 0413 04/03/21 1418 04/04/21 0444  CKTOTAL 269* 370* 304*   BNP (last 3 results) No results for input(s): PROBNP in the last 8760 hours. HbA1C: No results for input(s): HGBA1C in the last 72 hours. CBG: Recent Labs  Lab 03/31/21 1655  GLUCAP 124*   Lipid Profile: No results for input(s): CHOL, HDL, LDLCALC, TRIG, CHOLHDL, LDLDIRECT in the last 72 hours. Thyroid Function Tests: No results for input(s): TSH, T4TOTAL, FREET4, T3FREE, THYROIDAB in the last 72 hours. Anemia Panel: No results for input(s): VITAMINB12, FOLATE, FERRITIN, TIBC, IRON, RETICCTPCT in the last 72 hours. Sepsis Labs: Recent Labs  Lab 03/31/21 2045 03/31/21 2311 04/01/21 0413 04/02/21 0357  PROCALCITON  --   --  0.75 0.55  LATICACIDVEN 1.8 1.4  --   --     Recent Results (from the past 240 hour(s))  Resp Panel by RT-PCR (Flu A&B, Covid)     Status: None   Collection Time: 03/29/21  6:02 PM   Specimen: Nasopharyngeal(NP) swabs in vial transport medium  Result Value Ref Range Status   SARS Coronavirus 2 by RT PCR NEGATIVE NEGATIVE Final    Comment: (NOTE) SARS-CoV-2 target nucleic acids are NOT DETECTED.  The SARS-CoV-2 RNA is generally detectable in upper respiratory specimens during the acute phase of infection. The lowest concentration of SARS-CoV-2 viral copies this assay can detect is 138 copies/mL. A negative result does not preclude  SARS-Cov-2 infection and should not be used as the sole basis for treatment or other patient management decisions. A negative result may occur with  improper specimen collection/handling, submission of specimen other than nasopharyngeal swab, presence of viral mutation(s) within the areas targeted by this assay, and inadequate number of viral copies(<138 copies/mL). A negative result must be combined with clinical observations, patient history, and epidemiological information. The expected result is Negative.  Fact Sheet for Patients:  EntrepreneurPulse.com.au  Fact Sheet for Healthcare Providers:  IncredibleEmployment.be  This test is no t yet approved or cleared by the [place] FDA and  has been authorized for detection and/or diagnosis of SARS-CoV-2 by FDA under an Emergency Use Authorization (EUA). This EUA will remain  in effect (meaning this test can be used) for the duration of the COVID-19 declaration under  Section 564(b)(1) of the Act, 21 U.S.C.section 360bbb-3(b)(1), unless the authorization is terminated  or revoked sooner.       Influenza A by PCR NEGATIVE NEGATIVE Final   Influenza B by PCR NEGATIVE NEGATIVE Final    Comment: (NOTE) The Xpert Xpress SARS-CoV-2/FLU/RSV plus assay is intended as an aid in the diagnosis of influenza from Nasopharyngeal swab specimens and should not be used as a sole basis for treatment. Nasal washings and aspirates are unacceptable for Xpert Xpress SARS-CoV-2/FLU/RSV testing.  Fact Sheet for Patients: EntrepreneurPulse.com.au  Fact Sheet for Healthcare Providers: IncredibleEmployment.be  This test is not yet approved or cleared by the [place] FDA and has been authorized for detection and/or diagnosis of SARS-CoV-2 by FDA under an Emergency Use Authorization (EUA). This EUA will remain in effect (meaning this test can be used) for the duration of  the COVID-19 declaration under Section 564(b)(1) of the Act, 21 U.S.C. section 360bbb-3(b)(1), unless the authorization is terminated or revoked.  Performed at [facility], [address] [place], [place] [address]   CULTURE, BLOOD (ROUTINE X 2) w Reflex to ID Panel     Status: None (Preliminary result)   Collection Time: 03/31/21  1:05 PM   Specimen: BLOOD  Result Value Ref Range Status   Specimen Description BLOOD LEFT AC  Final   Special Requests   Final    BOTTLES DRAWN AEROBIC AND ANAEROBIC Blood Culture results may not be optimal due to an excessive volume of blood received in culture bottles   Culture   Final    NO GROWTH 4 DAYS Performed at [facility], [address] [place], [place] [address]    Report Status PENDING  Incomplete  CULTURE, BLOOD (ROUTINE X 2) w Reflex to ID Panel     Status: None (Preliminary result)   Collection Time: 03/31/21  1:15 PM   Specimen: BLOOD  Result Value Ref Range Status   Specimen Description BLOOD RIGHT AC  Final   Special Requests   Final    BOTTLES DRAWN AEROBIC AND ANAEROBIC Blood Culture results may not be optimal due to an excessive volume of blood received in culture bottles   Culture   Final    NO GROWTH 4 DAYS Performed at [facility], [address], [place], [place] [address]    Report Status PENDING  Incomplete  MRSA PCR Screening     Status: Abnormal   Collection Time: 03/31/21  5:02 PM   Specimen: Nasopharyngeal  Result Value Ref Range Status   MRSA by PCR POSITIVE (A) NEGATIVE Final    Comment:        The GeneXpert MRSA Assay (FDA approved for NASAL specimens only), is one component of a comprehensive MRSA colonization surveillance program. It is not intended to diagnose MRSA infection nor to guide or monitor treatment for MRSA infections. RESULT CALLED TO, READ BACK BY AND VERIFIED WITH: KRANCHESICA BELL at [place] on 03/31/2021 by CAF Performed at [facility], [address] [place], [place] [address]   Urine Culture     Status: Abnormal   Collection Time: 04/01/21 12:43 AM   Specimen: Urine, Random  Result Value Ref Range Status   Specimen Description   Final    URINE, RANDOM Performed at [facility], [address] [place], [place] [address]    Special Requests   Final    NONE Performed at [facility], [address], [place], [place] [address]  Culture 10,000 COLONIES/mL PSEUDOMONAS AERUGINOSA (A)  Final   Report Status 04/03/2021 FINAL  Final   Organism ID, Bacteria PSEUDOMONAS AERUGINOSA (A)  Final      Susceptibility   Pseudomonas aeruginosa - MIC*    CEFTAZIDIME 4 SENSITIVE Sensitive     CIPROFLOXACIN <=0.25 SENSITIVE Sensitive     GENTAMICIN <=1 SENSITIVE Sensitive     IMIPENEM 1 SENSITIVE Sensitive     PIP/TAZO 8 SENSITIVE Sensitive     CEFEPIME 2 SENSITIVE Sensitive     * 10,000 COLONIES/mL PSEUDOMONAS AERUGINOSA         Radiology Studies: No results found.      Scheduled Meds: .  stroke: mapping our early stages of recovery book   Does not apply Once  . doxycycline  100 mg Oral Q12H  . metoprolol succinate  50 mg Oral Q breakfast  . nystatin  5 mL Oral QID  . rivaroxaban  20 mg Oral Daily  . sodium chloride flush  3 mL Intravenous Once   Continuous Infusions:    LOS: 8 days    Time spent: 35 minutes.     Elmarie Shiley, MD [facility]   If 7PM-7AM, please contact night-coverage [email or web address]  04/07/2021, 3:39 PM

## 2021-04-08 LAB — BASIC METABOLIC PANEL
Anion gap: 8 (ref 5–15)
BUN: 16 mg/dL (ref 8–23)
CO2: 27 mmol/L (ref 22–32)
Calcium: 8.6 mg/dL — ABNORMAL LOW (ref 8.9–10.3)
Chloride: 104 mmol/L (ref 98–111)
Creatinine, Ser: 0.62 mg/dL (ref 0.44–1.00)
GFR, Estimated: >60 mL/min (ref >60)
Glucose, Bld: 102 mg/dL — ABNORMAL HIGH (ref 70–99)
Potassium: 3.8 mmol/L (ref 3.5–5.1)
Sodium: 139 mmol/L (ref 135–145)

## 2021-04-08 MED ORDER — HYDRALAZINE HCL 10 MG PO TABS
10.0000 mg | ORAL_TABLET | Freq: Two times a day (BID) | ORAL | Status: DC
  Administered 2021-04-08 – 2021-04-13 (×11): 10 mg via ORAL
  Filled 2021-04-08 (×12): qty 1

## 2021-04-08 MED ORDER — ENSURE ENLIVE PO LIQD
237.0000 mL | Freq: Three times a day (TID) | ORAL | Status: DC
  Administered 2021-04-08 – 2021-04-13 (×12): 237 mL via ORAL

## 2021-04-08 NOTE — TOC Progression Note (Signed)
Transition of Care [facility]) - Progression Note    Patient Details  Name: [name] MRN: [id number] Date of Birth: May 12, 1941  Transition of Care [facility]) CM/SW Contact  Zigmund Daniel Dorian Pod, RN Phone Number: 04/08/2021, 3:51 PM  Clinical Narrative:     TOC RN called Tammy Ramsey ([employer])and lvm requesting an update on pt's authorization for placement.   TOC will continue to follow up for ongoing needs.    Expected Discharge Plan: [place] Barriers to Discharge: Continued Medical Work up  Expected Discharge Plan and Services Expected Discharge Plan: [place]   Discharge Planning Services: CM Consult Post Acute Care Choice: [place]                                         Social Determinants of Health (SDOH) Interventions    Readmission Risk Interventions No flowsheet data found.

## 2021-04-08 NOTE — Progress Notes (Signed)
PROGRESS NOTE    [name]  UKG:[id number] DOB: [birth year] DOA: 03/29/2021 PCP: [name], DO   Brief Narrative: 80 year old with past medical history significant for mild dementia, A. fib who has not been compliant with her anticoagulation admitted to the hospital with acute change in mental status.  MRI brain confirmed acute CVA.  She is being admitted for further work-up.  Hospital course complicated by development of neuroleptic malignant syndrome after receiving haloperidol for agitation.  Her medical condition has improved.  She is awaiting for skilled nursing facility placement.   Assessment & Plan:   Active Problems:   HTN (hypertension)   Atrial fibrillation (HCC)   Chronic venous insufficiency   Delirium   Cellulitis   Acute CVA (cerebrovascular accident) ([place])   Neuroleptic malignant syndrome  1-Acute CVA;  Patient was admitted with change in mental status, she was increasingly confused.  MRI of the brain performed which show evidence of embolic infarcts. No compliant with anticoagulation. Continue with Xarelto.  Hemoglobin A1c 5.5, LDL 73. BP increasing. Will add hydralazine BID>   Neuroleptic malignant syndrome: Patient developed fever, diaphoresis and rigidity.  This was thought to be related to haloperidol.  Haloperidol has been discontinued.  Patient has improved clinically This has resolved.   Mild rhabdomyolysis: Received IV fluids.  Possible UTI:  urine culture growing 10K colonies of Pseudomonas.  Due to persistent delirium and she was treated with antibiotics plan to treat for 3 days  Permanent A. fib: Continue rate control with metoprolol Continue with Xarelto  Chronic venous stasis bilateral with possible underlying cellulitis, Started on Doxy for 3 more days antibiotics. Improving. Will ask wound care evaluation.   Mild dementia: Needs follow-up with neurology on 6/8. B 12; 444.  Hypokalemia; resolved.   Estimated body mass index is  31.47 kg/m as calculated from the following:   Height as of this encounter: 5\' 10"  (1.778 m).   Weight as of this encounter: 99.5 kg.   DVT prophylaxis: Xarelto  Code Status: Full Code.  Family Communication: Daughter at bedside 6/05 Disposition Plan:  Status is: Inpatient  Remains inpatient appropriate because:IV treatments appropriate due to intensity of illness or inability to take PO   Dispo: The patient is from: Home              Anticipated d/c is to: SNF              Patient currently is medically stable to d/c. awaiting placement. .    Difficult to place patient No        Consultants:   Neurology   Procedures:   None  Antimicrobials:  Fortaz  Subjective: Patient is alert, pleasantly confuse. She relates she has been eating.   Objective: Vitals:   04/07/21 2008 04/08/21 0637 04/08/21 0725 04/08/21 1140  BP: 124/66 124/79 (!) 157/78 (!) 157/71  Pulse: 79 63 65 63  Resp: 18 16 16 18   Temp:  98.1 F (36.7 C) 98 F (36.7 C) 98 F (36.7 C)  TempSrc: Oral Oral Oral Oral  SpO2: 96% 98% 99% 98%  Weight:      Height:        Intake/Output Summary (Last 24 hours) at 04/08/2021 1242 Last data filed at 04/08/2021 1123 Gross per 24 hour  Intake --  Output 300 ml  Net -300 ml   Filed Weights   03/29/21 1119 03/31/21 1659 04/01/21 2200  Weight: 101 kg 99.5 kg 99.5 kg    Examination:  General  exam: NAD Respiratory system: CTA Cardiovascular system: S 1, S 2 RRR Gastrointestinal system: BS present, soft, nt Central nervous system: Alert Extremities: Symmetric power   Data Reviewed: I have personally reviewed following labs and imaging studies  CBC: Recent Labs  Lab 04/02/21 0357 04/03/21 1418 04/04/21 0444 04/07/21 0447  WBC 11.1* 9.0 6.9 7.7  HGB 11.6* 13.0 11.7* 12.0  HCT 34.5* 37.7 34.2* 36.4  MCV 89.4 87.7 88.6 89.9  PLT 302 350 314 109   Basic Metabolic Panel: Recent Labs  Lab 04/02/21 0357 04/03/21 1418 04/04/21 0444  04/05/21 0413  NA 141 138 140 141  K 4.0 3.7 3.1* 3.5  CL 111 106 110 109  CO2 23 22 22 24   GLUCOSE 105* 109* 93 96  BUN 21 13 17 15   CREATININE 0.68 0.57 0.55 0.59  CALCIUM 8.1* 8.5* 8.0* 8.2*   GFR: Estimated Creatinine Clearance: 71.6 mL/min (by C-G formula based on SCr of 0.59 mg/dL). Liver Function Tests: No results for input(s): AST, ALT, ALKPHOS, BILITOT, PROT, ALBUMIN in the last 168 hours. No results for input(s): LIPASE, AMYLASE in the last 168 hours. No results for input(s): AMMONIA in the last 168 hours. Coagulation Profile: No results for input(s): INR, PROTIME in the last 168 hours. Cardiac Enzymes: Recent Labs  Lab 04/03/21 1418 04/04/21 0444  CKTOTAL 370* 304*   BNP (last 3 results) No results for input(s): PROBNP in the last 8760 hours. HbA1C: No results for input(s): HGBA1C in the last 72 hours. CBG: No results for input(s): GLUCAP in the last 168 hours. Lipid Profile: No results for input(s): CHOL, HDL, LDLCALC, TRIG, CHOLHDL, LDLDIRECT in the last 72 hours. Thyroid Function Tests: No results for input(s): TSH, T4TOTAL, FREET4, T3FREE, THYROIDAB in the last 72 hours. Anemia Panel: No results for input(s): VITAMINB12, FOLATE, FERRITIN, TIBC, IRON, RETICCTPCT in the last 72 hours. Sepsis Labs: Recent Labs  Lab 04/02/21 0357  PROCALCITON 0.55    Recent Results (from the past 240 hour(s))  Resp Panel by RT-PCR (Flu A&B, Covid)     Status: None   Collection Time: 03/29/21  6:02 PM   Specimen: Nasopharyngeal(NP) swabs in vial transport medium  Result Value Ref Range Status   SARS Coronavirus 2 by RT PCR NEGATIVE NEGATIVE Final    Comment: (NOTE) SARS-CoV-2 target nucleic acids are NOT DETECTED.  The SARS-CoV-2 RNA is generally detectable in upper respiratory specimens during the acute phase of infection. The lowest concentration of SARS-CoV-2 viral copies this assay can detect is 138 copies/mL. A negative result does not preclude  SARS-Cov-2 infection and should not be used as the sole basis for treatment or other patient management decisions. A negative result may occur with  improper specimen collection/handling, submission of specimen other than nasopharyngeal swab, presence of viral mutation(s) within the areas targeted by this assay, and inadequate number of viral copies(<138 copies/mL). A negative result must be combined with clinical observations, patient history, and epidemiological information. The expected result is Negative.  Fact Sheet for Patients:  EntrepreneurPulse.com.au  Fact Sheet for Healthcare Providers:  IncredibleEmployment.be  This test is no t yet approved or cleared by the [place] FDA and  has been authorized for detection and/or diagnosis of SARS-CoV-2 by FDA under an Emergency Use Authorization (EUA). This EUA will remain  in effect (meaning this test can be used) for the duration of the COVID-19 declaration under Section 564(b)(1) of the Act, 21 U.S.C.section 360bbb-3(b)(1), unless the authorization is terminated  or revoked sooner.  Influenza A by PCR NEGATIVE NEGATIVE Final   Influenza B by PCR NEGATIVE NEGATIVE Final    Comment: (NOTE) The Xpert Xpress SARS-CoV-2/FLU/RSV plus assay is intended as an aid in the diagnosis of influenza from Nasopharyngeal swab specimens and should not be used as a sole basis for treatment. Nasal washings and aspirates are unacceptable for Xpert Xpress SARS-CoV-2/FLU/RSV testing.  Fact Sheet for Patients: EntrepreneurPulse.com.au  Fact Sheet for Healthcare Providers: IncredibleEmployment.be  This test is not yet approved or cleared by the [place] FDA and has been authorized for detection and/or diagnosis of SARS-CoV-2 by FDA under an Emergency Use Authorization (EUA). This EUA will remain in effect (meaning this test can be used) for the duration of  the COVID-19 declaration under Section 564(b)(1) of the Act, 21 U.S.C. section 360bbb-3(b)(1), unless the authorization is terminated or revoked.  Performed at [facility], [address] [place], [place] [address]   CULTURE, BLOOD (ROUTINE X 2) w Reflex to ID Panel     Status: None   Collection Time: 03/31/21  1:05 PM   Specimen: BLOOD  Result Value Ref Range Status   Specimen Description BLOOD LEFT AC  Final   Special Requests   Final    BOTTLES DRAWN AEROBIC AND ANAEROBIC Blood Culture results may not be optimal due to an excessive volume of blood received in culture bottles   Culture   Final    NO GROWTH 7 DAYS Performed at [facility], [address] [place], [place] [address]    Report Status 04/07/2021 FINAL  Final  CULTURE, BLOOD (ROUTINE X 2) w Reflex to ID Panel     Status: None   Collection Time: 03/31/21  1:15 PM   Specimen: BLOOD  Result Value Ref Range Status   Specimen Description BLOOD RIGHT AC  Final   Special Requests   Final    BOTTLES DRAWN AEROBIC AND ANAEROBIC Blood Culture results may not be optimal due to an excessive volume of blood received in culture bottles   Culture   Final    NO GROWTH 7 DAYS Performed at [facility], [address] [place], [place] [address]    Report Status 04/07/2021 FINAL  Final  MRSA PCR Screening     Status: Abnormal   Collection Time: 03/31/21  5:02 PM   Specimen: Nasopharyngeal  Result Value Ref Range Status   MRSA by PCR POSITIVE (A) NEGATIVE Final    Comment:        The GeneXpert MRSA Assay (FDA approved for NASAL specimens only), is one component of a comprehensive MRSA colonization surveillance program. It is not intended to diagnose MRSA infection nor to guide or monitor treatment for MRSA infections. RESULT CALLED TO, READ BACK BY AND VERIFIED WITH: KRANCHESICA BELL at [place] on 03/31/2021 by CAF Performed at [facility], [address] [place], [place]  [address]   Urine Culture     Status: Abnormal   Collection Time: 04/01/21 12:43 AM   Specimen: Urine, Random  Result Value Ref Range Status   Specimen Description   Final    URINE, RANDOM Performed at [facility], [address] [place], [place] [address]    Special Requests   Final    NONE Performed at [facility], [place], [place] [address]    Culture 10,000 COLONIES/mL PSEUDOMONAS AERUGINOSA (A)  Final   Report Status 04/03/2021 FINAL  Final   Organism ID, Bacteria PSEUDOMONAS AERUGINOSA (A)  Final      Susceptibility   Pseudomonas aeruginosa - MIC*    CEFTAZIDIME 4 SENSITIVE Sensitive     CIPROFLOXACIN <=0.25 SENSITIVE Sensitive     GENTAMICIN <=1 SENSITIVE Sensitive     IMIPENEM 1 SENSITIVE Sensitive     PIP/TAZO 8 SENSITIVE Sensitive     CEFEPIME 2 SENSITIVE Sensitive     * 10,000 COLONIES/mL PSEUDOMONAS AERUGINOSA         Radiology Studies: No results found.      Scheduled Meds: .  stroke: mapping our early stages of recovery book   Does not apply Once  . doxycycline  100 mg Oral Q12H  . feeding supplement  237 mL Oral TID BM  . hydrALAZINE  10 mg Oral BID  . metoprolol succinate  50 mg Oral Q breakfast  . nystatin  5 mL Oral QID  . rivaroxaban  20 mg Oral Daily  . sodium chloride flush  3 mL Intravenous Once   Continuous Infusions:    LOS: 9 days    Time spent: 35 minutes.     Elmarie Shiley, MD [facility]   If 7PM-7AM, please contact night-coverage [email or web address]  04/08/2021, 12:42 PM

## 2021-04-09 NOTE — Care Management Important Message (Signed)
Important Message  Patient Details  Name: [name] MRN: [id number] Date of Birth: December 13, 1940   Medicare Important Message Given:  Yes  I talked with the patient's daughter, [name] [phone]) and reviewed the Important Message from Medicare with her.  She is in agreement with the discharge plan and awaiting authorization from [facility] for her mother to go SNF.  I asked if she would like a copy of the form and she replied, no.  I thanked her for her time.  [name] A Sherrel Ploch 04/09/2021, 2:28 PM

## 2021-04-09 NOTE — Consult Note (Signed)
WOC Nurse Consult Note: Reason for Consult: cellulitis Wound type: none Pressure Injury POA: NA NO open wounds, no active drainage. Some hemosiderin staining noted bilaterally. Needs long term compression stockings as an outpatient for management of venous disease.  Discussed POC with patient and bedside nurse.  Re consult if needed, will not follow at this time. Thanks  Asra Gambrel [employer], RN,CWOCN, CNS, [place] [phone])

## 2021-04-09 NOTE — Progress Notes (Signed)
Physical Therapy Treatment Patient Details Name: [name] MRN: [id number] DOB: July 24, 1941 Today's Date: 04/09/2021    History of Present Illness [name] is a 44yoF who comes to [facility] on 5/26 with AMS. PMH: AF on BB and xarelto, venous stasis, HTN, memory impairment, cat bite cellulitis (2016). Brain MRI revealing of 8 mm acute infarction affecting the cortical surface of the deep insula on the left. Second subcentimeter acute cortical infarction in the left parietal lobe. Remote Lt occipital and Left frontal lobe infarcts.    PT Comments    Pt in new room today, has had a sitter recently. Pt is awake and interactive today, no recollection of author from prior several sessions last week. Pt appears to be near her most recent baseline from what I can best determine. She is generally pleasant, follows simple single-step commands ~50-75% of the time, but is easily distracted and often requires repeat cuing to complete a more complex task like sitting up. Pt moves better this date coming to EOB, only requires minA however last session's MaxA may have been 2/2 somnolence. EOB balance is fairly good. Pt engaged in UE target touching today with unexpected and strange difficulty when using RUE, might be first qualification of imaging findings of remote Left occipital infarct, however cognition does not allow for extensive testing. Pt is able to be more successful with target touching using Left hand, but continually reverts back to RUE mid task despite multiple attempts cues LUE use. Extensive practice in coming to standing this date, pt still unable to abate posterior lean, has mild falls anxiety but follows author's safety cues for the most part. Pt never able to standing independently unsupported. Will continue to follow.     Follow Up Recommendations  SNF;Supervision for mobility/OOB     Equipment Recommendations  None recommended by PT    Recommendations for Other Services       Precautions  / Restrictions Precautions Precautions: Fall    Mobility  Bed Mobility Overal bed mobility: Needs Assistance Bed Mobility: Supine to Sit;Sit to Supine     Supine to sit: Min assist Sit to supine: Mod assist   General bed mobility comments: TotalA for scoot toward [facility]    Transfers   Equipment used: 1 person hand held assist Transfers: Sit to/from Stand Sit to Stand: Max assist;From elevated surface         General transfer comment: 8 attempts dependent STS transfers, continues to struggle with falls anxiety, posterior lean, requires constant physical assist to remain upright, diffculty remaining up >10 seconds.  Ambulation/Gait                 Stairs             Wheelchair Mobility    Modified Rankin (Stroke Patients Only)       Balance                                            Cognition Arousal/Alertness: Awake/alert Behavior During Therapy: WFL for tasks assessed/performed Overall Cognitive Status: History of cognitive impairments - at baseline Area of Impairment: Attention;Following commands;Memory;Problem solving                   Current Attention Level: Selective;Alternating Memory: Decreased recall of precautions;Decreased short-term memory Following Commands: Follows one step commands inconsistently;Follows one step commands with increased time  Problem Solving: Slow processing;Decreased initiation;Difficulty sequencing        Exercises General Exercises - Lower Extremity Heel Slides: AAROM;Both;15 reps;Supine Hip ABduction/ADduction: AAROM;Both;15 reps;Supine Other Exercises Other Exercises: seated EOB cues to touch authors finger at various places; struggles to make contact several times, unexplained difficulty, appears visually impaired at times, more consistently successful with LUE, but convinving pt to use LUE requires multimodal cues which creates a problem.    General Comments         Pertinent Vitals/Pain Pain Assessment: No/denies pain Faces Pain Scale: No hurt    Home Living                      Prior Function            PT Goals (current goals can now be found in the care plan section) Acute Rehab PT Goals Patient Stated Goal: regain baseline independence with ADL mobility PT Goal Formulation: With family Progress towards PT goals: Not progressing toward goals - comment    Frequency    Min 2X/week      PT Plan Current plan remains appropriate    Co-evaluation              AM-PAC PT "6 Clicks" Mobility   Outcome Measure  Help needed turning from your back to your side while in a flat bed without using bedrails?: Total Help needed moving from lying on your back to sitting on the side of a flat bed without using bedrails?: Total Help needed moving to and from a bed to a chair (including a wheelchair)?: Total Help needed standing up from a chair using your arms (e.g., wheelchair or bedside chair)?: Total Help needed to walk in hospital room?: Total Help needed climbing 3-5 steps with a railing? : Total 6 Click Score: 6    End of Session Equipment Utilized During Treatment: Gait belt Activity Tolerance: Patient tolerated treatment well;No increased pain Patient left: in bed;with call bell/phone within reach;with bed alarm set Nurse Communication: Mobility status PT Visit Diagnosis: Unsteadiness on feet (R26.81);Difficulty in walking, not elsewhere classified (R26.2);Muscle weakness (generalized) (M62.81)     Time: 9678-9381 PT Time Calculation (min) (ACUTE ONLY): 23 min  Charges:  $Neuromuscular Re-education: 23-37 mins                     2:31 PM, 04/09/21 Etta Grandchild, PT, DPT Physical Therapist - [facility]  [phone] ([place])     Willacy C 04/09/2021, 2:24 PM

## 2021-04-09 NOTE — Progress Notes (Signed)
PROGRESS NOTE    [name]  OHY:[id number] DOB: February 23, 1941 DOA: 03/29/2021 PCP: [name], DO   Brief Narrative: 80 year old with past medical history significant for mild dementia, A. fib who has not been compliant with her anticoagulation admitted to the hospital with acute change in mental status.  MRI brain confirmed acute CVA.  She is being admitted for further work-up.  Hospital course complicated by development of neuroleptic malignant syndrome after receiving haloperidol for agitation.  Her medical condition has improved.  She is awaiting for skilled nursing facility placement.   Assessment & Plan:   Active Problems:   HTN (hypertension)   Atrial fibrillation (HCC)   Chronic venous insufficiency   Delirium   Cellulitis   Acute CVA (cerebrovascular accident) ([place])   Neuroleptic malignant syndrome  1-Acute CVA;  Patient was admitted with change in mental status, she was increasingly confused.  MRI of the brain performed which show evidence of embolic infarcts. No compliant with anticoagulation. Continue with Xarelto.  Hemoglobin A1c 5.5, LDL 73. Started Hydralazine. BP better controlled.   Neuroleptic malignant syndrome: Patient developed fever, diaphoresis and rigidity.  This was thought to be related to haloperidol.  Haloperidol has been discontinued.  Patient has improved clinically This has resolved.   Mild rhabdomyolysis: Received IV fluids.  Possible UTI:  urine culture growing 10K colonies of Pseudomonas.  Due to persistent delirium and she was treated with antibiotics plan to treat for 3 days  Permanent A. fib: Continue rate control with metoprolol Continue with Xarelto  Chronic venous stasis bilateral with possible underlying cellulitis, Started on Doxy for 3 more days antibiotics. Improving. Will ask wound care evaluation.   Mild dementia: Needs follow-up with neurology on 6/8. B 12; 444. Confusion persist.   Hypokalemia; resolved.  Urine  retention;  I and O cath.  Monitor .   Estimated body mass index is 31.47 kg/m as calculated from the following:   Height as of this encounter: 5\' 10"  (1.778 m).   Weight as of this encounter: 99.5 kg.   DVT prophylaxis: Xarelto  Code Status: Full Code.  Family Communication: Daughter at bedside 6/05 Disposition Plan:  Status is: Inpatient  Remains inpatient appropriate because:IV treatments appropriate due to intensity of illness or inability to take PO   Dispo: The patient is from: Home              Anticipated d/c is to: SNF              Patient currently is medically stable to d/c. awaiting placement. .    Difficult to place patient No        Consultants:   Neurology   Procedures:   None  Antimicrobials:  Fortaz  Subjective: She was alert, no complaints. Confuse.   Objective: Vitals:   04/08/21 1919 04/09/21 0320 04/09/21 0835 04/09/21 1207  BP: (!) 163/82 (!) 157/84 113/66 102/72  Pulse: 75 64 65 (!) 56  Resp: 17 17 16 18   Temp: 97.8 F (36.6 C) 97.8 F (36.6 C) 97.9 F (36.6 C) 98 F (36.7 C)  TempSrc:  Oral  Oral  SpO2: 97% 100% 100% 95%  Weight:      Height:        Intake/Output Summary (Last 24 hours) at 04/09/2021 1509 Last data filed at 04/09/2021 1400 Gross per 24 hour  Intake 360 ml  Output 1100 ml  Net -740 ml   Filed Weights   03/29/21 1119 03/31/21 1659 04/01/21 2200  Weight: 101 kg 99.5 kg 99.5 kg    Examination:  General exam: NAD Respiratory system: CTA Cardiovascular system: S 1, S 2 RRR Gastrointestinal system: BS present, soft, nt Central nervous system: Alert Extremities: Symmetric power   Data Reviewed: I have personally reviewed following labs and imaging studies  CBC: Recent Labs  Lab 04/03/21 1418 04/04/21 0444 04/07/21 0447  WBC 9.0 6.9 7.7  HGB 13.0 11.7* 12.0  HCT 37.7 34.2* 36.4  MCV 87.7 88.6 89.9  PLT 350 314 076   Basic Metabolic Panel: Recent Labs  Lab 04/03/21 1418 04/04/21 0444  04/05/21 0413 04/08/21 1523  NA 138 140 141 139  K 3.7 3.1* 3.5 3.8  CL 106 110 109 104  CO2 22 22 24 27   GLUCOSE 109* 93 96 102*  BUN 13 17 15 16   CREATININE 0.57 0.55 0.59 0.62  CALCIUM 8.5* 8.0* 8.2* 8.6*   GFR: Estimated Creatinine Clearance: 71.6 mL/min (by C-G formula based on SCr of 0.62 mg/dL). Liver Function Tests: No results for input(s): AST, ALT, ALKPHOS, BILITOT, PROT, ALBUMIN in the last 168 hours. No results for input(s): LIPASE, AMYLASE in the last 168 hours. No results for input(s): AMMONIA in the last 168 hours. Coagulation Profile: No results for input(s): INR, PROTIME in the last 168 hours. Cardiac Enzymes: Recent Labs  Lab 04/03/21 1418 04/04/21 0444  CKTOTAL 370* 304*   BNP (last 3 results) No results for input(s): PROBNP in the last 8760 hours. HbA1C: No results for input(s): HGBA1C in the last 72 hours. CBG: No results for input(s): GLUCAP in the last 168 hours. Lipid Profile: No results for input(s): CHOL, HDL, LDLCALC, TRIG, CHOLHDL, LDLDIRECT in the last 72 hours. Thyroid Function Tests: No results for input(s): TSH, T4TOTAL, FREET4, T3FREE, THYROIDAB in the last 72 hours. Anemia Panel: No results for input(s): VITAMINB12, FOLATE, FERRITIN, TIBC, IRON, RETICCTPCT in the last 72 hours. Sepsis Labs: No results for input(s): PROCALCITON, LATICACIDVEN in the last 168 hours.  Recent Results (from the past 240 hour(s))  CULTURE, BLOOD (ROUTINE X 2) w Reflex to ID Panel     Status: None   Collection Time: 03/31/21  1:05 PM   Specimen: BLOOD  Result Value Ref Range Status   Specimen Description BLOOD LEFT AC  Final   Special Requests   Final    BOTTLES DRAWN AEROBIC AND ANAEROBIC Blood Culture results may not be optimal due to an excessive volume of blood received in culture bottles   Culture   Final    NO GROWTH 7 DAYS Performed at [facility], [address] [place], [place] [address]    Report Status 04/07/2021 FINAL  Final   CULTURE, BLOOD (ROUTINE X 2) w Reflex to ID Panel     Status: None   Collection Time: 03/31/21  1:15 PM   Specimen: BLOOD  Result Value Ref Range Status   Specimen Description BLOOD RIGHT AC  Final   Special Requests   Final    BOTTLES DRAWN AEROBIC AND ANAEROBIC Blood Culture results may not be optimal due to an excessive volume of blood received in culture bottles   Culture   Final    NO GROWTH 7 DAYS Performed at [facility], [address], [place], [place] [address]    Report Status 04/07/2021 FINAL  Final  MRSA PCR Screening     Status: Abnormal   Collection Time: 03/31/21  5:02 PM   Specimen: Nasopharyngeal  Result Value Ref Range Status  MRSA by PCR POSITIVE (A) NEGATIVE Final    Comment:        The GeneXpert MRSA Assay (FDA approved for NASAL specimens only), is one component of a comprehensive MRSA colonization surveillance program. It is not intended to diagnose MRSA infection nor to guide or monitor treatment for MRSA infections. RESULT CALLED TO, READ BACK BY AND VERIFIED WITH: KRANCHESICA BELL at [place] on 03/31/2021 by CAF Performed at [facility], [address] [place], [place] [address]   Urine Culture     Status: Abnormal   Collection Time: 04/01/21 12:43 AM   Specimen: Urine, Random  Result Value Ref Range Status   Specimen Description   Final    URINE, RANDOM Performed at [facility], [address], [place], [place] [address]    Special Requests   Final    NONE Performed at [facility], [place], [place] [address]    Culture 10,000 COLONIES/mL PSEUDOMONAS AERUGINOSA (A)  Final   Report Status 04/03/2021 FINAL  Final   Organism ID, Bacteria PSEUDOMONAS AERUGINOSA (A)  Final      Susceptibility   Pseudomonas aeruginosa - MIC*    CEFTAZIDIME 4 SENSITIVE Sensitive     CIPROFLOXACIN <=0.25 SENSITIVE Sensitive     GENTAMICIN <=1 SENSITIVE Sensitive     IMIPENEM 1 SENSITIVE Sensitive      PIP/TAZO 8 SENSITIVE Sensitive     CEFEPIME 2 SENSITIVE Sensitive     * 10,000 COLONIES/mL PSEUDOMONAS AERUGINOSA         Radiology Studies: No results found.      Scheduled Meds: .  stroke: mapping our early stages of recovery book   Does not apply Once  . feeding supplement  237 mL Oral TID BM  . hydrALAZINE  10 mg Oral BID  . metoprolol succinate  50 mg Oral Q breakfast  . nystatin  5 mL Oral QID  . rivaroxaban  20 mg Oral Daily  . sodium chloride flush  3 mL Intravenous Once   Continuous Infusions:    LOS: 10 days    Time spent: 35 minutes.     Elmarie Shiley, MD [facility]   If 7PM-7AM, please contact night-coverage [email or web address]  04/09/2021, 3:09 PM

## 2021-04-10 MED ORDER — LORAZEPAM 2 MG/ML IJ SOLN
1.0000 mg | Freq: Once | INTRAMUSCULAR | Status: AC
  Administered 2021-04-10: 1 mg via INTRAVENOUS
  Filled 2021-04-10: qty 1

## 2021-04-10 MED ORDER — ZINC OXIDE 40 % EX OINT
TOPICAL_OINTMENT | Freq: Every day | CUTANEOUS | Status: DC | PRN
  Filled 2021-04-10: qty 113

## 2021-04-10 NOTE — Progress Notes (Signed)
Initial Nutrition Assessment  DOCUMENTATION CODES:  Not applicable  INTERVENTION:   Liberalize diet to regular for poor PO intake (averaging ~35% intake) and advanced age  Ensure Enlive po TID, each supplement provides 350 kcal and 20 grams of protein  Request new measured weight  MVI with minerals daily  Nursing to assist pt with tray set-up at meals  NUTRITION DIAGNOSIS:  Inadequate oral intake related to lethargy/confusion as evidenced by meal completion < 50%.  GOAL:  Patient will meet greater than or equal to 90% of their needs  MONITOR:  PO intake,Supplement acceptance  REASON FOR ASSESSMENT:  LOS    ASSESSMENT:  Pt sent to ED from PCP office for AMS. In ED, work-up consistent with acute CVA. PMH relevant for HTN, atrial fibrillation, mild dementia.   Pt quite disoriented at admission and alert only to self with some agitation. Thought to be the result of CVA and then due to medications. Mental status has improved this admission, is now pleasantly confused. Will dc to SNF when bed available.   Pt resting in bed at the time of visit, pleasantly confused at this time and unable to answer nutrition related questions. Reviewed intake, pt has refused several meals this admission and average intake is <50% consumed. Pt unsure if she has been consuming her ensure, but states she does like them. Will continue as ordered. Discussed in IDT rounds, pt medically stable for discharge once placement is obtained.  Average Meal Intake: . 5/28-6/7: 35% intake x 11 recorded meals (0-100%)  Medications Reviewed  Labs Reviewed  NUTRITION - FOCUSED PHYSICAL EXAM: Flowsheet Row Most Recent Value  Orbital Region No depletion  Upper Arm Region No depletion  Thoracic and Lumbar Region No depletion  Buccal Region No depletion  Temple Region No depletion  Clavicle Bone Region Mild depletion  Clavicle and Acromion Bone Region Mild depletion  Scapular Bone Region No depletion  Dorsal  Hand Mild depletion  Patellar Region No depletion  Anterior Thigh Region No depletion  Posterior Calf Region No depletion  Edema (RD Assessment) Mild  Hair Reviewed  Eyes Reviewed  Mouth Reviewed  Skin Reviewed  Nails Reviewed     Diet Order:   Diet Order            Diet regular Room service appropriate? Yes; Fluid consistency: Thin  Diet effective now                EDUCATION NEEDS:  No education needs have been identified at this time  Skin:  Skin Assessment: Reviewed RN Assessment  Last BM:  6/6 - type 5, per RN documentation  Height:  Ht Readings from Last 1 Encounters:  04/01/21 5\' 10"  (1.778 m)    Weight:  Wt Readings from Last 1 Encounters:  04/01/21 99.5 kg    Ideal Body Weight:  68.2 kg  BMI:  Body mass index is 31.47 kg/m.  Estimated Nutritional Needs:   Kcal:  1800-2000 kcal/d  Protein:  90-100 g/d  Fluid:  >1.8L/d   Ranell Patrick, RD, LDN Clinical Dietitian Pager on [place]

## 2021-04-10 NOTE — Progress Notes (Signed)
Occupational Therapy Treatment Patient Details Name: [name] MRN: [id number] DOB: [birth year] Today's Date: 04/10/2021    History of present illness [name] is a 45yoF who [facility] 5[facility] with AMS. PMH: AF on BB and xarelto, venous stasis, HTN, memory impairment, cat bite cellulitis (2016). Brain MRI revealing of 8 mm acute infarction affecting the cortical surface of the deep insula on the left. Second subcentimeter acute cortical infarction in the left parietal lobe. Remote Lt occipital and Left frontal lobe infarcts.   OT comments  [name] was seen for OT treatment on this date. Upon arrival to room pt supine in bed. Pt requires visual cues to follow 1 step commands inconsistently. Pt completed 5 finger opposition with MAX step by step cueing, pt is limited by cognition great than limited by coordination deficits. MAX A sit<>stand, MIN A lateral scooting EOB. MIN A + MOD cues for grooming sitting EOB. Pt frequently attempts to self-initiate return to supine, MAX encouragement to tolerate sitting EOB. Pt continues to benefit from skilled OT services to maximize return to PLOF and minimize risk of future falls, injury, caregiver burden, and readmission. Will continue to follow POC. Discharge recommendation remains appropriate.    Follow Up Recommendations  SNF;Supervision/Assistance - 24 hour    Equipment Recommendations  Other (comment)    Recommendations for Other Services      Precautions / Restrictions Precautions Precautions: Fall Restrictions Weight Bearing Restrictions: No       Mobility Bed Mobility Overal bed mobility: Needs Assistance Bed Mobility: Supine to Sit;Sit to Supine     Supine to sit: Min assist Sit to supine: Min assist        Transfers Overall transfer level: Needs assistance Equipment used: 1 person hand held assist Transfers: Sit to/from Stand Sit to Stand: Max assist;From elevated surface        Lateral/Scoot Transfers: Min  assist General transfer comment: appears anxious and posterior lean    Balance Overall balance assessment: Needs assistance Sitting-balance support: Single extremity supported;Feet supported Sitting balance-[name]: Poor   Postural control: Left lateral lean;Posterior lean Standing balance support: Bilateral upper extremity supported Standing balance-[name]: Zero                             ADL either performed or assessed with clinical judgement   ADL Overall ADL's : Needs assistance/impaired                                       General ADL Comments: MAX A for ADL t/f. MIN A + MOD cues for grooming sitting EOB               Cognition Arousal/Alertness: Awake/alert Behavior During Therapy: WFL for tasks assessed/performed Overall Cognitive Status: History of cognitive impairments - at baseline Area of Impairment: Attention;Following commands;Memory;Problem solving                 Orientation Level: Person Current Attention Level: Selective;Alternating Memory: Decreased recall of precautions;Decreased short-term memory Following Commands: Follows one step commands inconsistently;Follows one step commands with increased time     Problem Solving: Slow processing;Decreased initiation;Difficulty sequencing General Comments: Pt requires visual cues to follow 1 step commands inconsistently        Exercises Exercises: Other exercises Other Exercises Other Exercises: Pt educated re: falls prevention, re-oriented Other Exercises:  sup<>sit, sit<>stand, sitting/balance/tolerance, lateral scoot, grooming           Pertinent Vitals/ Pain       Pain Assessment: No/denies pain         Frequency  Min 2X/week        Progress Toward Goals  OT Goals(current goals can now be found in the care plan section)  Progress towards OT goals: Progressing toward goals  Acute Rehab OT Goals Patient Stated Goal: regain baseline  independence with ADL mobility OT Goal Formulation: Patient unable to participate in goal setting Time For Goal Achievement: 04/17/21 Potential to Achieve Goals: Fair ADL Goals Pt Will Perform Grooming: with supervision;sitting Pt Will Perform Lower Body Dressing: with min assist;sit to/from stand Pt Will Transfer to Toilet: with min assist;ambulating Pt Will Perform Toileting - Clothing Manipulation and hygiene: with min assist;sit to/from stand  Plan Discharge plan remains appropriate;Frequency remains appropriate       AM-PAC OT "6 Clicks" Daily Activity     Outcome Measure   Help from another person eating meals?: A Little Help from another person taking care of personal grooming?: A Little Help from another person toileting, which includes using toliet, bedpan, or urinal?: A Lot Help from another person bathing (including washing, rinsing, drying)?: A Lot Help from another person to put on and taking off regular upper body clothing?: A Lot Help from another person to put on and taking off regular lower body clothing?: A Lot 6 Click Score: 14    End of Session    OT Visit Diagnosis: Unsteadiness on feet (R26.81);Muscle weakness (generalized) (M62.81)   Activity Tolerance Patient tolerated treatment well;Other (comment) (limited by cognition)   Patient Left in bed;with call bell/phone within reach;with bed alarm set;with nursing/sitter in room           Time: 6283-6629 OT Time Calculation (min): 12 min  Charges: OT General Charges $OT Visit: 1 Visit OT Treatments $Self Care/Home Management : 8-22 mins  Dessie Coma, M.S. OTR/L  04/10/21, 4:12 PM  ascom [phone]

## 2021-04-10 NOTE — Progress Notes (Signed)
PROGRESS NOTE    [name]  KAJ:[id number] DOB: Jan 24, 1941 DOA: 03/29/2021 PCP: [name], DO   Brief Narrative: 80 year old with past medical history significant for mild dementia, A. fib who has not been compliant with her anticoagulation admitted to the hospital with acute change in mental status.  MRI brain confirmed acute CVA.  She is being admitted for further work-up.  Hospital course complicated by development of neuroleptic malignant syndrome after receiving haloperidol for agitation. Patient continue to be confuse, likely related to undiagnosed dementia.   Her medical condition has improved.  She is awaiting for skilled nursing facility placement.   Assessment & Plan:   Active Problems:   HTN (hypertension)   Atrial fibrillation (HCC)   Chronic venous insufficiency   Delirium   Cellulitis   Acute CVA (cerebrovascular accident) (Agency)   Neuroleptic malignant syndrome  1-Acute CVA;  Patient was admitted with change in mental status, she was increasingly confused.  MRI of the brain performed which show evidence of embolic infarcts. No compliant with anticoagulation. Continue with Xarelto.  Hemoglobin A1c 5.5, LDL 73. Started Hydralazine. BP better controlled.   Neuroleptic malignant syndrome: Patient developed fever, diaphoresis and rigidity.  This was thought to be related to haloperidol.  Haloperidol has been discontinued.  Patient has improved clinically This has resolved.   Mild rhabdomyolysis: Received IV fluids.  Possible UTI:  urine culture growing 10K colonies of Pseudomonas.  Due to persistent delirium and she was treated with antibiotics plan to treat for 3 days  Permanent A. fib: Continue rate control with metoprolol Continue with Xarelto  Chronic venous stasis bilateral with possible underlying cellulitis, Started on Doxy for 3 more days antibiotics. Improving.   Mild dementia: Needs follow-up with neurology on 6/8. B 12; 444. Confusion persist.    Hypokalemia; resolved.  Urine retention;  I and O cath on 6/06. Monitor . Patient was able to urinate overnight.   Estimated body mass index is 31.47 kg/m as calculated from the following:   Height as of this encounter: 5\' 10"  (1.778 m).   Weight as of this encounter: 99.5 kg.   DVT prophylaxis: Xarelto  Code Status: Full Code.  Family Communication: Daughter at bedside 6/07 Disposition Plan:  Status is: Inpatient  Remains inpatient appropriate because:IV treatments appropriate due to intensity of illness or inability to take PO   Dispo: The patient is from: Home              Anticipated d/c is to: SNF              Patient currently is medically stable to d/c. awaiting placement. .    Difficult to place patient No        Consultants:   Neurology   Procedures:   None  Antimicrobials:  Fortaz  Subjective: She is alert, no new complaints. Confuse. Per nurse report patient was able to urinate last night.    Objective: Vitals:   04/09/21 1941 04/10/21 0404 04/10/21 0711 04/10/21 1100  BP: 115/65 (!) 152/74 (!) 146/79 (!) 92/58  Pulse: 66 68 60 (!) 55  Resp: 17 17 17 17   Temp: 98.4 F (36.9 C) (!) 97.5 F (36.4 C) 98.1 F (36.7 C) 97.6 F (36.4 C)  TempSrc:   Oral Oral  SpO2: 92% 96% 95% 90%  Weight:      Height:        Intake/Output Summary (Last 24 hours) at 04/10/2021 1522 Last data filed at 04/10/2021 1400 Gross per  24 hour  Intake 720 ml  Output 15 ml  Net 705 ml   Filed Weights   03/29/21 1119 03/31/21 1659 04/01/21 2200  Weight: 101 kg 99.5 kg 99.5 kg    Examination:  General exam: NAD Respiratory system: CTA Cardiovascular system: S 1, S 2 RRR Gastrointestinal system: BS present, soft, nt Central nervous system: Alert Extremities: Symmetric power.    Data Reviewed: I have personally reviewed following labs and imaging studies  CBC: Recent Labs  Lab 04/04/21 0444 04/07/21 0447  WBC 6.9 7.7  HGB 11.7* 12.0  HCT 34.2* 36.4   MCV 88.6 89.9  PLT 314 482   Basic Metabolic Panel: Recent Labs  Lab 04/04/21 0444 04/05/21 0413 04/08/21 1523  NA 140 141 139  K 3.1* 3.5 3.8  CL 110 109 104  CO2 22 24 27   GLUCOSE 93 96 102*  BUN 17 15 16   CREATININE 0.55 0.59 0.62  CALCIUM 8.0* 8.2* 8.6*   GFR: Estimated Creatinine Clearance: 71.6 mL/min (by C-G formula based on SCr of 0.62 mg/dL). Liver Function Tests: No results for input(s): AST, ALT, ALKPHOS, BILITOT, PROT, ALBUMIN in the last 168 hours. No results for input(s): LIPASE, AMYLASE in the last 168 hours. No results for input(s): AMMONIA in the last 168 hours. Coagulation Profile: No results for input(s): INR, PROTIME in the last 168 hours. Cardiac Enzymes: Recent Labs  Lab 04/04/21 0444  CKTOTAL 304*   BNP (last 3 results) No results for input(s): PROBNP in the last 8760 hours. HbA1C: No results for input(s): HGBA1C in the last 72 hours. CBG: No results for input(s): GLUCAP in the last 168 hours. Lipid Profile: No results for input(s): CHOL, HDL, LDLCALC, TRIG, CHOLHDL, LDLDIRECT in the last 72 hours. Thyroid Function Tests: No results for input(s): TSH, T4TOTAL, FREET4, T3FREE, THYROIDAB in the last 72 hours. Anemia Panel: No results for input(s): VITAMINB12, FOLATE, FERRITIN, TIBC, IRON, RETICCTPCT in the last 72 hours. Sepsis Labs: No results for input(s): PROCALCITON, LATICACIDVEN in the last 168 hours.  Recent Results (from the past 240 hour(s))  MRSA PCR Screening     Status: Abnormal   Collection Time: 03/31/21  5:02 PM   Specimen: Nasopharyngeal  Result Value Ref Range Status   MRSA by PCR POSITIVE (A) NEGATIVE Final    Comment:        The GeneXpert MRSA Assay (FDA approved for NASAL specimens only), is one component of a comprehensive MRSA colonization surveillance program. It is not intended to diagnose MRSA infection nor to guide or monitor treatment for MRSA infections. RESULT CALLED TO, READ BACK BY AND VERIFIED  WITH: KRANCHESICA BELL at [place] on 03/31/2021 by CAF Performed at [facility], [address] [place], [place] [address]   Urine Culture     Status: Abnormal   Collection Time: 04/01/21 12:43 AM   Specimen: Urine, Random  Result Value Ref Range Status   Specimen Description   Final    URINE, RANDOM Performed at [facility], [address], [place], [place] [address]    Special Requests   Final    NONE Performed at [facility], [place], [place] [address]    Culture 10,000 COLONIES/mL PSEUDOMONAS AERUGINOSA (A)  Final   Report Status 04/03/2021 FINAL  Final   Organism ID, Bacteria PSEUDOMONAS AERUGINOSA (A)  Final      Susceptibility   Pseudomonas aeruginosa - MIC*    CEFTAZIDIME 4 SENSITIVE Sensitive     CIPROFLOXACIN <=0.25  SENSITIVE Sensitive     GENTAMICIN <=1 SENSITIVE Sensitive     IMIPENEM 1 SENSITIVE Sensitive     PIP/TAZO 8 SENSITIVE Sensitive     CEFEPIME 2 SENSITIVE Sensitive     * 10,000 COLONIES/mL PSEUDOMONAS AERUGINOSA         Radiology Studies: No results found.      Scheduled Meds: .  stroke: mapping our early stages of recovery book   Does not apply Once  . feeding supplement  237 mL Oral TID BM  . hydrALAZINE  10 mg Oral BID  . metoprolol succinate  50 mg Oral Q breakfast  . nystatin  5 mL Oral QID  . rivaroxaban  20 mg Oral Daily  . sodium chloride flush  3 mL Intravenous Once   Continuous Infusions:    LOS: 11 days    Time spent: 35 minutes.     Elmarie Shiley, MD [facility]   If 7PM-7AM, please contact night-coverage [email or web address]  04/10/2021, 3:22 PM

## 2021-04-11 ENCOUNTER — Ambulatory Visit: Payer: Medicare HMO | Admitting: Diagnostic Neuroimaging

## 2021-04-11 ENCOUNTER — Telehealth: Payer: Self-pay | Admitting: *Deleted

## 2021-04-11 DIAGNOSIS — I10 Essential (primary) hypertension: Secondary | ICD-10-CM

## 2021-04-11 MED ORDER — DEXTROMETHORPHAN-QUINIDINE 20-10 MG PO CAPS
1.0000 | ORAL_CAPSULE | Freq: Every day | ORAL | Status: DC
  Administered 2021-04-11: 1 via ORAL
  Filled 2021-04-11: qty 1

## 2021-04-11 MED ORDER — ATORVASTATIN CALCIUM 20 MG PO TABS
40.0000 mg | ORAL_TABLET | Freq: Every day | ORAL | Status: DC
  Administered 2021-04-11 – 2021-04-12 (×2): 40 mg via ORAL
  Filled 2021-04-11 (×2): qty 2

## 2021-04-11 MED ORDER — NYSTATIN 100000 UNIT/ML MT SUSP: 5.0000 mL | Freq: Four times a day (QID) | OROMUCOSAL | 0 refills | Status: DC

## 2021-04-11 MED ORDER — ATORVASTATIN CALCIUM 20 MG PO TABS: 40.0000 mg | ORAL_TABLET | Freq: Every day | ORAL | Status: DC

## 2021-04-11 MED ORDER — PROPRANOLOL HCL 10 MG PO TABS
10.0000 mg | ORAL_TABLET | Freq: Two times a day (BID) | ORAL | Status: DC
  Administered 2021-04-11 – 2021-04-13 (×4): 10 mg via ORAL
  Filled 2021-04-11 (×5): qty 1

## 2021-04-11 MED ORDER — HYDRALAZINE HCL 10 MG PO TABS: 10.0000 mg | ORAL_TABLET | Freq: Two times a day (BID) | ORAL | Status: DC

## 2021-04-11 MED ORDER — ENSURE ENLIVE PO LIQD: 237.0000 mL | Freq: Three times a day (TID) | ORAL | 12 refills | Status: DC

## 2021-04-11 NOTE — TOC Progression Note (Signed)
Transition of Care [facility]) - Progression Note    Patient Details  Name: [name] MRN: [id number] Date of Birth: 1941/08/28  Transition of Care [facility]) CM/SW [place], RN Phone Number: 04/11/2021, 3:47 PM  Clinical Narrative:   Patient's daughter, [name] was in this morning, stating she wants POA for patient today.  RNCM stated that Power of [name] takes time and legal consult.  Daughter asked for patient to be transferred to [facility], made care team aware, but at this time patient would need a medical reason for transfer.  Daughter in law [name] states that she is attempting POA but has not obtained this yet.    Patient was scheduled to go to [facility], however the facility cannot accept the patient at this time, as she has a sitter in the room for patient safety.  Staff attempted to DC sitter but she was not safe overnight.  Will continue to monitor situation to see if patient is able to go to SNF tomorrow if sitter is DC.    Expected Discharge Plan: [place] Barriers to Discharge: Continued Medical Work up  Expected Discharge Plan and Services Expected Discharge Plan: [place]   Discharge Planning Services: CM Consult Post Acute Care Choice: [facility]   Expected Discharge Date: 04/11/21                                     Social Determinants of Health (SDOH) Interventions    Readmission Risk Interventions No flowsheet data found.

## 2021-04-11 NOTE — Telephone Encounter (Signed)
[name]  Pt. Daughter came in wanting to know info on how to become POA over her mom, She also wanted  a letter from you Dr. Wynetta Emery stating she needs to be POA over her mom [name] due to Dementia and being hospitalized and cannot make decisions. She would like a phone call. Please advise

## 2021-04-11 NOTE — Progress Notes (Signed)
Daughter brought forth concerns of wanting to be transferred to [facility]. I notified TOC and MD and provided contact information. Orma Flaming, RN

## 2021-04-11 NOTE — Progress Notes (Addendum)
[facility] PROGRESS NOTE    [name]  UYQ:[id number] DOB: 1941-08-04 DOA: 03/29/2021 PCP: [name], DO      Brief Narrative:  [name] is a 80 y.o. F with HTN, Afib not on AC, and memory loss who presented with confusion.    Patient seen by her PCPs office on the day of admission, for routine follow up of HTN, etc and was noted to be very confused, slow to answer questions.  In the ER, chemistries and blood counts normal.  CT head unremarkable.        Assessment & Plan:  Acute embolic stroke Patient was admitted for altered mental status.  Electrolytes normal, she was treated with fluids and antibiotics for possible mild cellulitis.  TSH and B12 normal.  MRI was obtained that then showed multiple infarcts.  Echo and carotids unremarkable.  -Start atorvastatin -Aspirin not ordered given on Xarelto -Xarelto restarted -tPA not given because outside window -Dysphagia screen ordered in ER -PT eval ordered: recommended SNF -Smoking cessation: not pertinent    Acute metabolic encephalopathy superimposed on dementia Patient presented with acute confusion, this in retrospect appears to be from stroke, less likely UTI or cellulitis.  Of note, patient has had concerns by family for many months that hse had memory loss, and had been referred for neuropsych testing previously.  Given she has now had resolution of her metabolic insults, but remains confused, I suspect she has dementia  At present she thinks she is in a grocery store in [place].  She is not able to make even simple decisions of her own capacity -Recommend MOCA/MMSE testing at her SNF and referral for more extensive neuropsych testing  -Trial Neudexta -Trial propranolol    Hypertension BP controlled -Continue hydralazine -Hold metop -Start propranolol as above  Neuroleptic malignant syndrome Treated with Haldol and developed symptoms of fever, diaphoresis, and rigidity,  thought to be due to Haldol.  Haldol discontinued and symptoms resolved.  Suspected UTI Scant Pseudomonas in urine, treated with 3 days meropenem and ceftazidime, treatment completed, no further signs of infection.  Permanent atrial fibrillation Last PCP note state she had stopped this.  Here, her daughter in law suspects that she was not taking it. -Resume Xarelto -Continue BB  Chronic venous insufficiency, suspected mild cellulitis Possible cellulitis, treated with adequate course of antibiotics, no signs of infection at this point  Hypokalemia Supplemented, resolved     Obesity BMI 31    Disposition: Status is: Inpatient  Remains inpatient appropriate because:Unsafe d/c plan   Dispo: The patient is from: Home              Anticipated d/c is to: SNF              Patient currently is medically stable to d/c.   Difficult to place patient Yes       Level of care: Med-Surg       MDM: The below labs and imaging reports were reviewed and summarized above.  Medication management as above.    DVT prophylaxis:  rivaroxaban (XARELTO) tablet 20 mg  Code Status: FULL Family Communication: Daughter in Sports coach by phone, called daughter, no answer         Subjective: No new fever, vomiting.  No focal weakness, respiratory distress, abdominal pain.  Objective: Vitals:   04/11/21 0529 04/11/21 0822 04/11/21 1113 04/11/21 1603  BP: (!) 155/126 (!) 155/89 120/60 138/87  Pulse: 73 65 (!) 59 63  Resp: 17 (!)  22 18 18   Temp: (!) 97.4 F (36.3 C) 97.8 F (36.6 C) 97.7 F (36.5 C) 97.7 F (36.5 C)  TempSrc: Oral Oral    SpO2: 96% (!) 78% 99% 96%  Weight:      Height:        Intake/Output Summary (Last 24 hours) at 04/11/2021 1624 Last data filed at 04/11/2021 1419 Gross per 24 hour  Intake 480 ml  Output --  Net 480 ml   Filed Weights   03/29/21 1119 03/31/21 1659 04/01/21 2200  Weight: 101 kg 99.5 kg 99.5 kg    Examination: General appearance:  adult  female, alert and in no acute distress.   HEENT: Anicteric, conjunctiva pink, lids and lashes normal. No nasal deformity, discharge, epistaxis.  Lips moist edentulous, oropharynx tacky dry, no oral lesions.   Skin: Warm and dry.  no jaundice.  No suspicious rashes or lesions. Cardiac: RRR, nl S1-S2, no murmurs appreciated.  Capillary refill is brisk  JVP normal.  No LE edema.  Radial pulses 2+ and symmetric. Respiratory: Normal respiratory rate and rhythm.  CTAB without rales or wheezes. Abdomen: Abdomen soft.  no TTP. No ascites, distension, hepatosplenomegaly.   MSK: No deformities or effusions. Neuro: Awake and alert.  EOMI, moves all extremities. Speech fluent.    Psych: Sensorium intact and responding to questions, attention diminished. Affect blunted.  Judgment and insight appear impaired.    Data Reviewed: I have personally reviewed following labs and imaging studies:  CBC: Recent Labs  Lab 04/07/21 0447  WBC 7.7  HGB 12.0  HCT 36.4  MCV 89.9  PLT 017   Basic Metabolic Panel: Recent Labs  Lab 04/05/21 0413 04/08/21 1523  NA 141 139  K 3.5 3.8  CL 109 104  CO2 24 27  GLUCOSE 96 102*  BUN 15 16  CREATININE 0.59 0.62  CALCIUM 8.2* 8.6*   GFR: Estimated Creatinine Clearance: 71.6 mL/min (by C-G formula based on SCr of 0.62 mg/dL). Liver Function Tests: No results for input(s): AST, ALT, ALKPHOS, BILITOT, PROT, ALBUMIN in the last 168 hours. No results for input(s): LIPASE, AMYLASE in the last 168 hours. No results for input(s): AMMONIA in the last 168 hours. Coagulation Profile: No results for input(s): INR, PROTIME in the last 168 hours. Cardiac Enzymes: No results for input(s): CKTOTAL, CKMB, CKMBINDEX, TROPONINI in the last 168 hours. BNP (last 3 results) No results for input(s): PROBNP in the last 8760 hours. HbA1C: No results for input(s): HGBA1C in the last 72 hours. CBG: No results for input(s): GLUCAP in the last 168 hours. Lipid Profile: No results  for input(s): CHOL, HDL, LDLCALC, TRIG, CHOLHDL, LDLDIRECT in the last 72 hours. Thyroid Function Tests: No results for input(s): TSH, T4TOTAL, FREET4, T3FREE, THYROIDAB in the last 72 hours. Anemia Panel: No results for input(s): VITAMINB12, FOLATE, FERRITIN, TIBC, IRON, RETICCTPCT in the last 72 hours. Urine analysis:    Component Value Date/Time   COLORURINE AMBER (A) 03/31/2021 1702   APPEARANCEUR CLOUDY (A) 03/31/2021 1702   APPEARANCEUR Clear 01/05/2021 1055   LABSPEC 1.040 (H) 03/31/2021 1702   PHURINE 5.0 03/31/2021 1702   GLUCOSEU NEGATIVE 03/31/2021 1702   HGBUR SMALL (A) 03/31/2021 1702   BILIRUBINUR NEGATIVE 03/31/2021 1702   BILIRUBINUR Negative 01/05/2021 1055   KETONESUR 5 (A) 03/31/2021 1702   PROTEINUR 100 (A) 03/31/2021 1702   NITRITE NEGATIVE 03/31/2021 1702   LEUKOCYTESUR SMALL (A) 03/31/2021 1702   Sepsis Labs: @LABRCNTIP (procalcitonin:4,lacticacidven:4)  )No results found for this or any  previous visit (from the past 240 hour(s)).       Radiology Studies: No results found.      Scheduled Meds: .  stroke: mapping our early stages of recovery book   Does not apply Once  . feeding supplement  237 mL Oral TID BM  . hydrALAZINE  10 mg Oral BID  . metoprolol succinate  50 mg Oral Q breakfast  . nystatin  5 mL Oral QID  . rivaroxaban  20 mg Oral Daily  . sodium chloride flush  3 mL Intravenous Once   Continuous Infusions:   LOS: 12 days    Time spent: 35 minutes    Edwin Dada, MD [facility] 04/11/2021, 4:24 PM     Please page though [place] or Epic secure chat:  For [employer], Adult nurse

## 2021-04-11 NOTE — Telephone Encounter (Signed)
Called number provided gentleman answered advised him to have her come to office to pick up packet

## 2021-04-11 NOTE — Telephone Encounter (Signed)
POA is a Administrator, [employer]. Please give her the packet from elder care and the yellow folder- I do not do capacity evaluations, so I can't give her a letter saying her mother cannot make her own decisions.

## 2021-04-11 NOTE — Progress Notes (Signed)
Physical Therapy Treatment Patient Details Name: [name] MRN: [id number] DOB: 04/12/1941 Today's Date: 04/11/2021    History of Present Illness [name] is a 16yoF who [facility] with AMS. PMH: AF on BB and xarelto, venous stasis, HTN, memory impairment, cat bite cellulitis (2016). Brain MRI revealing of 8 mm acute infarction affecting the cortical surface of the deep insula on the left. Second subcentimeter acute cortical infarction in the left parietal lobe. Remote Lt occipital and Left frontal lobe infarcts.    PT Comments    Pt was long sitting in bed semi-asleep upon arriving. Very lethargic throughout session. May be due to Ativan issued 330 am this date. She required max assistant and encouragement to participate. Pt's cognition deficits currently impacting mobility, transfers, and safe functional abilities. She did stand EOB 3 x with max assist + vcs. Ambulated 3 ft fwd/bwd but was unsafe throughout. Pt will greatly benefit from SNF at DC to address deficits while assisting pt to PLOF. Currently recommend +2 assistance for all OOB/mobility activity.Pt was long sitting in bed with sitter at bedside post session.    Follow Up Recommendations  SNF;Supervision/Assistance - 24 hour;Supervision for mobility/OOB     Equipment Recommendations  Other (comment) (defer to next level of care)       Precautions / Restrictions Precautions Precautions: Fall Restrictions Weight Bearing Restrictions: No    Mobility  Bed Mobility Overal bed mobility: Needs Assistance Bed Mobility: Supine to Sit;Sit to Supine     Supine to sit: Max assist;HOB elevated Sit to supine: Max assist   General bed mobility comments: max assist 2/2 to poor participation and ability to initiate movements. Cognition limits session progression    Transfers Overall transfer level: Needs assistance Equipment used: Rolling walker (2 wheeled) Transfers: Sit to/from Stand Sit to Stand: Max assist;From  elevated surface         General transfer comment: Max assist to stand 3 x EOB. Needs encouragement throughout session. therapist questions if medication issued at 330 am impacting session (ativan)  Ambulation/Gait Ambulation/Gait assistance: Mod assist;Max assist Gait Distance (Feet): 6 Feet Assistive device: Rolling walker (2 wheeled) Gait Pattern/deviations: Step-to pattern;Staggering left;Staggering right;Shuffle Gait velocity: decreased   General Gait Details: pt ambulated 3 ft fwd and 3 ft backward with max vcs and constant assistance. pt resistive throughout and quickly requested to get back into bed." Please leave me alone.I'm tired."       Balance Overall balance assessment: Needs assistance Sitting-balance support: Bilateral upper extremity supported;Feet supported Sitting balance-[name]: Poor Sitting balance - Comments: pt struggles staying awake while sitting EOB   Standing balance support: Bilateral upper extremity supported;During functional activity Standing balance-[name]: Poor Standing balance comment: very high fall risk even with BUE support       Cognition Arousal/Alertness: Lethargic;Suspect due to medications Behavior During Therapy: Flat affect Overall Cognitive Status: History of cognitive impairments - at baseline Area of Impairment: Attention;Following commands;Memory;Problem solving                 Orientation Level: Disoriented to;Place;Time;Situation Current Attention Level: Divided Memory: Decreased recall of precautions;Decreased short-term memory Following Commands: Follows one step commands inconsistently     Problem Solving: Slow processing;Decreased initiation;Difficulty sequencing General Comments: Pt is lethargic throughout session. Confused and disoriented. Needs max encouragement for participation             Pertinent Vitals/Pain Pain Assessment: No/denies pain           PT  Goals (current goals can now be  found in the care plan section) Acute Rehab PT Goals Patient Stated Goal: none stated Progress towards PT goals: Progressing toward goals    Frequency    Min 2X/week      PT Plan Current plan remains appropriate       AM-PAC PT "6 Clicks" Mobility   Outcome Measure  Help needed turning from your back to your side while in a flat bed without using bedrails?: A Lot Help needed moving from lying on your back to sitting on the side of a flat bed without using bedrails?: A Lot Help needed moving to and from a bed to a chair (including a wheelchair)?: A Lot Help needed standing up from a chair using your arms (e.g., wheelchair or bedside chair)?: A Lot Help needed to walk in hospital room?: A Lot Help needed climbing 3-5 steps with a railing? : A Lot 6 Click Score: 12    End of Session Equipment Utilized During Treatment: Gait belt Activity Tolerance: Patient limited by lethargy;Patient limited by fatigue;Other (comment) (cognition deficits limts this session) Patient left: in bed;with call bell/phone within reach;with bed alarm set;with nursing/sitter in room Nurse Communication: Mobility status PT Visit Diagnosis: Unsteadiness on feet (R26.81);Difficulty in walking, not elsewhere classified (R26.2);Muscle weakness (generalized) (M62.81)     Time: 1020-1040 PT Time Calculation (min) (ACUTE ONLY): 20 min  Charges:  $Therapeutic Activity: 8-22 mins                     Julaine Fusi PTA 04/11/21, 11:02 AM

## 2021-04-12 MED ORDER — DEXTROMETHORPHAN-QUINIDINE 20-10 MG PO CAPS
1.0000 | ORAL_CAPSULE | Freq: Every day | ORAL | Status: DC
  Administered 2021-04-12: 1 via ORAL
  Filled 2021-04-12 (×2): qty 1

## 2021-04-12 NOTE — Care Management Important Message (Signed)
Important Message  Patient Details  Name: [name] MRN: [id number] Date of Birth: 17-Dec-1940   Medicare Important Message Given:  Other (see comment)  RN CM said to talk with [name] to review the Important Message from Medicare but she said her husband would be the one to decide if they were in agreement with the discharge to [facility]. Provided my co-worker's Earnest Bailey number [phone]) as I will be out of the office on Friday. [name] already has a copy of the form.   [name] A [name] 04/12/2021, 3:44 PM

## 2021-04-12 NOTE — Plan of Care (Signed)
Pt oriented only to self. VSS. Able to sleep several hours overnight. PRN ativan given x1. Incontinence care provided PRN. No sitter at bedside overnight. Fall/safety precautions in place, rounding performed, needs/concerns addressed.  Problem: Education: Goal: Knowledge of secondary prevention will improve Outcome: Progressing Goal: Knowledge of patient specific risk factors addressed and post discharge goals established will improve Outcome: Progressing Goal: Individualized Educational Video(s) Outcome: Progressing   Problem: Education: Goal: Knowledge of patient specific risk factors addressed and post discharge goals established will improve Outcome: Progressing   Problem: Ischemic Stroke/TIA Tissue Perfusion: Goal: Complications of ischemic stroke/TIA will be minimized Outcome: Progressing   Problem: Health Behavior/Discharge Planning: Goal: Ability to manage health-related needs will improve Outcome: Progressing   Problem: Education: Goal: Knowledge of General Education information will improve Description: Including pain rating scale, medication(s)/side effects and non-pharmacologic comfort measures Outcome: Progressing   Problem: Clinical Measurements: Goal: Ability to maintain clinical measurements within normal limits will improve Outcome: Progressing Goal: Will remain free from infection Outcome: Progressing Goal: Diagnostic test results will improve Outcome: Progressing Goal: Respiratory complications will improve Outcome: Progressing Goal: Cardiovascular complication will be avoided Outcome: Progressing   Problem: Nutrition: Goal: Adequate nutrition will be maintained Outcome: Progressing   Problem: Activity: Goal: Risk for activity intolerance will decrease Outcome: Progressing   Problem: Coping: Goal: Level of anxiety will decrease Outcome: Progressing   Problem: Elimination: Goal: Will not experience complications related to bowel motility Outcome:  Progressing Goal: Will not experience complications related to urinary retention Outcome: Progressing   Problem: Pain Managment: Goal: General experience of comfort will improve Outcome: Progressing   Problem: Safety: Goal: Ability to remain free from injury will improve Outcome: Progressing   Problem: Skin Integrity: Goal: Risk for impaired skin integrity will decrease Outcome: Progressing

## 2021-04-12 NOTE — TOC Progression Note (Signed)
Transition of Care [facility]) - Progression Note    Patient Details  Name: ELEANORA GUINYARD MRN: [id number] Date of Birth: 12-09-1940  Transition of Care [facility]) CM/SW [place], RN Phone Number: 04/12/2021, 9:49 AM  Clinical Narrative:   RNCM spoke with patient's son, Pilar Plate this morning.  He states that the family is working on [employer] at this time.    Sitter was discontinued last evening and patient has reportedly been doing well since.  Son Pilar Plate states that the family is amenable for patient to go to [facility].  RNCM explained that the sitter would have to be DC for 24 hours prior to patient's admission to the facility.  Son amenable to plan.  Tammy at [facility] notified, will look into auth status, if this will need resubmission or if we can use the current authorization from [place].    Expected Discharge Plan: [place] Barriers to Discharge: Continued Medical Work up  Expected Discharge Plan and Services Expected Discharge Plan: [place]   Discharge Planning Services: CM Consult Post Acute Care Choice: [place]   Expected Discharge Date: 04/11/21                                     Social Determinants of Health (SDOH) Interventions    Readmission Risk Interventions No flowsheet data found.

## 2021-04-12 NOTE — Progress Notes (Signed)
[name] PROGRESS NOTE    Crystel Demarco [name]  GYK:[id number] DOB: 06-18-1941 DOA: 03/29/2021 PCP: [name], DO      Brief Narrative:  [name] is a 80 y.o. F with HTN, Afib not on AC, and memory loss who presented with confusion.    Patient seen by her PCPs office on the day of admission, for routine follow up of HTN, etc and was noted to be very confused, slow to answer questions.  In the ER, chemistries and blood counts normal.  CT head unremarkable.        Assessment & Plan:  Acute embolic stroke See note from 6/8 -Continue atorvastatin, Xarelto   Encephalopathy superimposed on dementia See note from 6/8.  Doint well without sitter for last 24 hours -Avoid benzodiazepines -Continue Neudexta and propranolol      Hypertension BP controlled -Continue hydralazine and propanolol    Suspected UTI Resolved  Permanent atrial fibrillation -Continue Xarelto and beta-blocker      Disposition: Status is: Inpatient  Remains inpatient appropriate because:Unsafe d/c plan   Dispo: The patient is from: Home              Anticipated d/c is to: SNF              Patient currently is medically stable to d/c.   Difficult to place patient Yes       Level of care: Med-Surg       MDM: The below labs and imaging reports were reviewed and summarized above.  Medication management as above.    DVT prophylaxis:  rivaroxaban (XARELTO) tablet 20 mg  Code Status: FULL Family Communication:           Subjective: Somewhat restless, but no fever, respiratory distress, confusion.  No complaints to me.  Objective: Vitals:   04/12/21 0616 04/12/21 0851 04/12/21 1212 04/12/21 1728  BP: (!) 160/91 (!) 161/89 (!) 155/81 (!) 145/97  Pulse: (!) 59 (!) 55 64 63  Resp: 16 16 18 16   Temp: 98.2 F (36.8 C) 98 F (36.7 C) 97.9 F (36.6 C) 98 F (36.7 C)  TempSrc: Oral  Oral   SpO2: 96% 100% 94% 99%  Weight:      Height:         Intake/Output Summary (Last 24 hours) at 04/12/2021 1732 Last data filed at 04/12/2021 1047 Gross per 24 hour  Intake 360 ml  Output --  Net 360 ml   Filed Weights   03/29/21 1119 03/31/21 1659 04/01/21 2200  Weight: 101 kg 99.5 kg 99.5 kg    Examination: General appearance: No, lying in bed, sleeping, arouses easily     HEENT: Edentulous Skin: No suspicious rashes or lesions Cardiac: RRR, no murmurs, no lower extremity edema Respiratory: Respiratory effort normal, lungs clear without rales or wheezes Abdomen: Abdomen soft without tenderness palpation or guarding MSK:  Neuro: Arouses easily, extraocular movements intact, face symmetric, moves all extremities with generalized weakness but symmetric strength, speech fluent Psych: Confused     Data Reviewed: I have personally reviewed following labs and imaging studies:  CBC: Recent Labs  Lab 04/07/21 0447  WBC 7.7  HGB 12.0  HCT 36.4  MCV 89.9  PLT 793   Basic Metabolic Panel: Recent Labs  Lab 04/08/21 1523  NA 139  K 3.8  CL 104  CO2 27  GLUCOSE 102*  BUN 16  CREATININE 0.62  CALCIUM 8.6*   GFR: Estimated Creatinine Clearance: 71.6 mL/min (by C-G  formula based on SCr of 0.62 mg/dL). Liver Function Tests: No results for input(s): AST, ALT, ALKPHOS, BILITOT, PROT, ALBUMIN in the last 168 hours. No results for input(s): LIPASE, AMYLASE in the last 168 hours. No results for input(s): AMMONIA in the last 168 hours. Coagulation Profile: No results for input(s): INR, PROTIME in the last 168 hours. Cardiac Enzymes: No results for input(s): CKTOTAL, CKMB, CKMBINDEX, TROPONINI in the last 168 hours. BNP (last 3 results) No results for input(s): PROBNP in the last 8760 hours. HbA1C: No results for input(s): HGBA1C in the last 72 hours. CBG: No results for input(s): GLUCAP in the last 168 hours. Lipid Profile: No results for input(s): CHOL, HDL, LDLCALC, TRIG, CHOLHDL, LDLDIRECT in the last 72 hours. Thyroid  Function Tests: No results for input(s): TSH, T4TOTAL, FREET4, T3FREE, THYROIDAB in the last 72 hours. Anemia Panel: No results for input(s): VITAMINB12, FOLATE, FERRITIN, TIBC, IRON, RETICCTPCT in the last 72 hours. Urine analysis:    Component Value Date/Time   COLORURINE AMBER (A) 03/31/2021 1702   APPEARANCEUR CLOUDY (A) 03/31/2021 1702   APPEARANCEUR Clear 01/05/2021 1055   LABSPEC 1.040 (H) 03/31/2021 1702   PHURINE 5.0 03/31/2021 1702   GLUCOSEU NEGATIVE 03/31/2021 1702   HGBUR SMALL (A) 03/31/2021 1702   BILIRUBINUR NEGATIVE 03/31/2021 1702   BILIRUBINUR Negative 01/05/2021 1055   KETONESUR 5 (A) 03/31/2021 1702   PROTEINUR 100 (A) 03/31/2021 1702   NITRITE NEGATIVE 03/31/2021 1702   LEUKOCYTESUR SMALL (A) 03/31/2021 1702   Sepsis Labs: @LABRCNTIP (procalcitonin:4,lacticacidven:4)  )No results found for this or any previous visit (from the past 240 hour(s)).       Radiology Studies: No results found.      Scheduled Meds:   stroke: mapping our early stages of recovery book   Does not apply Once   atorvastatin  40 mg Oral QHS   Dextromethorphan-quiNIDine  1 capsule Oral QHS   feeding supplement  237 mL Oral TID BM   hydrALAZINE  10 mg Oral BID   nystatin  5 mL Oral QID   propranolol  10 mg Oral BID   rivaroxaban  20 mg Oral Daily   sodium chloride flush  3 mL Intravenous Once   Continuous Infusions:   LOS: 13 days    Time spent: 25  minutes    Edwin Dada, MD [facility] 04/12/2021, 5:32 PM     Please page though [place] or Epic secure chat:  For [employer], Adult nurse

## 2021-04-13 DIAGNOSIS — I6340 Cerebral infarction due to embolism of unspecified cerebral artery: Secondary | ICD-10-CM | POA: Diagnosis not present

## 2021-04-13 DIAGNOSIS — I10 Essential (primary) hypertension: Secondary | ICD-10-CM | POA: Diagnosis not present

## 2021-04-13 DIAGNOSIS — R69 Illness, unspecified: Secondary | ICD-10-CM | POA: Diagnosis not present

## 2021-04-13 DIAGNOSIS — I4821 Permanent atrial fibrillation: Secondary | ICD-10-CM | POA: Diagnosis not present

## 2021-04-13 DIAGNOSIS — R2681 Unsteadiness on feet: Secondary | ICD-10-CM | POA: Diagnosis not present

## 2021-04-13 DIAGNOSIS — I872 Venous insufficiency (chronic) (peripheral): Secondary | ICD-10-CM | POA: Diagnosis not present

## 2021-04-13 DIAGNOSIS — B370 Candidal stomatitis: Secondary | ICD-10-CM | POA: Diagnosis not present

## 2021-04-13 DIAGNOSIS — I4891 Unspecified atrial fibrillation: Secondary | ICD-10-CM | POA: Diagnosis not present

## 2021-04-13 DIAGNOSIS — E785 Hyperlipidemia, unspecified: Secondary | ICD-10-CM | POA: Diagnosis not present

## 2021-04-13 DIAGNOSIS — R279 Unspecified lack of coordination: Secondary | ICD-10-CM | POA: Diagnosis not present

## 2021-04-13 DIAGNOSIS — M6281 Muscle weakness (generalized): Secondary | ICD-10-CM | POA: Diagnosis not present

## 2021-04-13 DIAGNOSIS — I639 Cerebral infarction, unspecified: Secondary | ICD-10-CM | POA: Diagnosis not present

## 2021-04-13 DIAGNOSIS — R5381 Other malaise: Secondary | ICD-10-CM | POA: Diagnosis not present

## 2021-04-13 DIAGNOSIS — R488 Other symbolic dysfunctions: Secondary | ICD-10-CM | POA: Diagnosis not present

## 2021-04-13 MED ORDER — DEXTROMETHORPHAN-QUINIDINE 20-10 MG PO CAPS: 1.0000 | ORAL_CAPSULE | Freq: Every day | ORAL | Status: DC

## 2021-04-13 MED ORDER — PROPRANOLOL HCL 10 MG PO TABS
10.0000 mg | ORAL_TABLET | Freq: Two times a day (BID) | ORAL | Status: DC
Start: 2021-04-13 — End: 2021-05-24

## 2021-04-13 NOTE — Care Management Important Message (Signed)
Important Message  Patient Details  Name: [name] MRN: [id number] Date of Birth: [birth year]   Medicare Important Message Given:  Other (see comment)  Received note from coworker, Colan Neptune to follow up with daughter regarding Medicare IM.  Called and left a message with [name] to explain Medicare IM.  Encouraged her to return call should she have questions.  Per Kathy's previous IM note, daughter has copy of Medicare IM to reference.     Dannette Barbara 04/13/2021, 1:11 PM

## 2021-04-13 NOTE — Plan of Care (Signed)
Pt rested during the overnight. New med regimen effective.  Problem: Education: Goal: Knowledge of secondary prevention will improve Outcome: Progressing Goal: Knowledge of patient specific risk factors addressed and post discharge goals established will improve Outcome: Progressing Goal: Individualized Educational Video(s) Outcome: Progressing   Problem: Education: Goal: Knowledge of General Education information will improve Description: Including pain rating scale, medication(s)/side effects and non-pharmacologic comfort measures Outcome: Progressing   Problem: Health Behavior/Discharge Planning: Goal: Ability to manage health-related needs will improve Outcome: Progressing   Problem: Clinical Measurements: Goal: Ability to maintain clinical measurements within normal limits will improve Outcome: Progressing Goal: Will remain free from infection Outcome: Progressing Goal: Diagnostic test results will improve Outcome: Progressing Goal: Respiratory complications will improve Outcome: Progressing Goal: Cardiovascular complication will be avoided Outcome: Progressing   Problem: Activity: Goal: Risk for activity intolerance will decrease Outcome: Progressing   Problem: Nutrition: Goal: Adequate nutrition will be maintained Outcome: Progressing   Problem: Coping: Goal: Level of anxiety will decrease Outcome: Progressing   Problem: Elimination: Goal: Will not experience complications related to bowel motility Outcome: Progressing Goal: Will not experience complications related to urinary retention Outcome: Progressing   Problem: Pain Managment: Goal: General experience of comfort will improve Outcome: Progressing   Problem: Safety: Goal: Ability to remain free from injury will improve Outcome: Progressing   Problem: Skin Integrity: Goal: Risk for impaired skin integrity will decrease Outcome: Progressing   Problem: Education: Goal: Knowledge of patient  specific risk factors addressed and post discharge goals established will improve Outcome: Progressing   Problem: Ischemic Stroke/TIA Tissue Perfusion: Goal: Complications of ischemic stroke/TIA will be minimized Outcome: Progressing

## 2021-04-13 NOTE — Progress Notes (Signed)
Physical Therapy Treatment Patient Details Name: [name] MRN: [id number] DOB: February 03, 1941 Today's Date: 04/13/2021    History of Present Illness [name] is a 31yoF who [facility] on [facility] with AMS. PMH: AF on BB and xarelto, venous stasis, HTN, memory impairment, cat bite cellulitis (2016). Brain MRI revealing of 8 mm acute infarction affecting the cortical surface of the deep insula on the left. Second subcentimeter acute cortical infarction in the left parietal lobe. Remote Lt occipital and Left frontal lobe infarcts.    PT Comments    Pt was supine in bed upon arriving. She is alert and pleasantly confused. Agreeable to PT session and OOB activity. Was able to follow simple commands throughout. She was able to exit L side of bed with increased time and min assist. Required mod assist to return to supine after OOB activity. Was able to stand to RW and ambulated ~ 12 ft with RW. Fatigued quickly. Overall though, tolerated session well. Will greatly benefit from SNF to address deficits with strength, balance, gait, and overall safety while performing ADLs. Pt was in bed with bed alarm set, and call bell in reach.    Follow Up Recommendations  SNF;Supervision/Assistance - 24 hour;Supervision for mobility/OOB     Equipment Recommendations  None recommended by PT       Precautions / Restrictions Precautions Precautions: Fall Restrictions Weight Bearing Restrictions: No    Mobility  Bed Mobility Overal bed mobility: Needs Assistance Bed Mobility: Supine to Sit;Sit to Supine     Supine to sit: Min assist;HOB elevated Sit to supine: Mod assist   General bed mobility comments: Pt was able to exit L siode of bed with min assist. required mod assist to return to supine after OOB activity    Transfers Overall transfer level: Needs assistance Equipment used: Rolling walker (2 wheeled) Transfers: Sit to/from Stand Sit to Stand: From elevated surface;Min assist          General transfer comment: pt was able to STS from EOB to RW with min assist. vcs for handplacement. fw wt shift, and overall safety improvements  Ambulation/Gait Ambulation/Gait assistance: Min assist Gait Distance (Feet): 12 Feet Assistive device: Rolling walker (2 wheeled) Gait Pattern/deviations: Trunk flexed;Shuffle;Step-to pattern Gait velocity: decreased   General Gait Details: pt was able to ambulate 12 ft with slow step to pattern. Fatigued quickly but overall tolerated well.     Balance Overall balance assessment: Needs assistance Sitting-balance support: Bilateral upper extremity supported;Feet supported Sitting balance-[name]: Good Sitting balance - Comments: no LOB sitting EOB x ~ 5 minutes   Standing balance support: Bilateral upper extremity supported;During functional activity Standing balance-[name]: Fair Standing balance comment: reliant on UE support during dynamic activity      Cognition Arousal/Alertness: Awake/alert Behavior During Therapy: WFL for tasks assessed/performed Overall Cognitive Status: History of cognitive impairments - at baseline        General Comments: Pt was much more awake and cooperative this date versus previously observed. does continue to present with cognition deficits however pleasantly confused.             Pertinent Vitals/Pain Pain Assessment: No/denies pain Faces Pain Scale: No hurt Pain Descriptors / Indicators: Sore Pain Intervention(s): Limited activity within patient's tolerance;Monitored during session;Repositioned     PT Goals (current goals can now be found in the care plan section) Acute Rehab PT Goals Patient Stated Goal: none stated Progress towards PT goals: Progressing toward goals    Frequency  Min 2X/week      PT Plan Current plan remains appropriate       AM-PAC PT "6 Clicks" Mobility   Outcome Measure  Help needed turning from your back to your side while in a flat bed without  using bedrails?: A Lot Help needed moving from lying on your back to sitting on the side of a flat bed without using bedrails?: A Lot Help needed moving to and from a bed to a chair (including a wheelchair)?: A Lot Help needed standing up from a chair using your arms (e.g., wheelchair or bedside chair)?: A Lot Help needed to walk in hospital room?: A Lot Help needed climbing 3-5 steps with a railing? : A Lot 6 Click Score: 12    End of Session Equipment Utilized During Treatment: Gait belt Activity Tolerance: Patient tolerated treatment well;Patient limited by fatigue Patient left: in bed;with call bell/phone within reach;with bed alarm set;with nursing/sitter in room Nurse Communication: Mobility status PT Visit Diagnosis: Unsteadiness on feet (R26.81);Difficulty in walking, not elsewhere classified (R26.2);Muscle weakness (generalized) (M62.81)     Time: 5686-1683 PT Time Calculation (min) (ACUTE ONLY): 15 min  Charges:  $Gait Training: 8-22 mins                    Julaine Fusi PTA 04/13/21, 1:50 PM

## 2021-04-13 NOTE — Discharge Summary (Signed)
Physician Discharge Summary  [name] SJG:[id number] DOB: [birth year] DOA: 03/29/2021  PCP: [name], DO  Admit date: 03/29/2021 Discharge date: 04/13/2021  Admitted From: Home  Disposition:  SNF   Recommendations for Outpatient Follow-up:  Follow up with Neurology in 4-6 weeks for stroke Follow up with PCP within 1 week of SNF discharge Please obtain do Speech therapy evaluation for cognitive eval while in SNF and fax to PCP Dr. Wynetta Emery Please keep appointment previously scheduled for neuropsychiatric testing for dementia        Home Health: N/A  Equipment/Devices: TBD at SNF  Discharge Condition: Fair  CODE STATUS: FULL Diet recommendation: Cardiac  Brief/Interim Summary: [name] is a 80 y.o. F with HTN, Afib not on AC, and memory loss who presented with confusion.     Patient seen by her PCPs office on the day of admission, for routine follow up of HTN, etc and was noted to be very confused, slow to answer questions.   In the ER, chemistries and blood counts normal.  CT head unremarkable.     PRINCIPAL HOSPITAL DIAGNOSIS: Acute embolic stroke    Discharge Diagnoses:   Acute embolic stroke Patient was admitted for altered mental status.  Electrolytes were normal, she was treated with fluids and antibiotics for possible mild cellulitis.  TSH and B12 normal.    MRI was obtained that then showed multiple infarcts.   Echo and carotids unremarkable. Started on atorvastatin and resume Xarelto, with which she had been noncompliant due to memory loss.    tPA not given because outside window.  Dysphagia screen ordered in ER.  PT eval ordered.  Nonsmoker.       Acute metabolic encephalopathy superimposed on dementia Patient presented with acute confusion, this in retrospect appears to be from stroke, less likely UTI or cellulitis.   Of note, patient has had concerns by family for many months that hse had memory loss, and had been referred for neuropsych  testing previously.  Given she has now had resolution of her metabolic insults, but remains confused, I suspect she has dementia   At present she thinks she is in a grocery store in [place].  She is not able to make even simple decisions of her own capacity -Recommend MOCA/MMSE testing at her SNF and referral for more extensive neuropsych testing   While here, she was started on Nuedexta and her metoprolol was changed to propranolol, both to ameliorate agitation in an unfamiliar setting.       Hypertension Metoprolol changed to propranolol for synergy with side use for agitation.   Neuroleptic malignant syndrome Treated with Haldol for agitation and developed symptoms of fever, diaphoresis, and rigidity, thought to be due to Haldol.  Haldol discontinued and symptoms resolved.   Suspected UTI Scant Pseudomonas in urine, treated with 3 days meropenem and ceftazidime, treatment completed, no further signs of infection.   Permanent atrial fibrillation Last PCP note state she had stopped this.  Here, her daughter in law suspects that she was not taking it.  She was resumed on Xarelto.  BB was continued, switched to Propranolol.  Chronic venous insufficiency, suspected mild cellulitis Possible cellulitis, treated with adequate course of antibiotics, no signs of infection at this point   Hypokalemia Supplemented, resolved   Obesity BMI 31            Discharge Instructions  Discharge Instructions     Diet - low sodium heart healthy   Complete by: As directed  Increase activity slowly   Complete by: As directed       Allergies as of 04/13/2021       Reactions   Amlodipine    Leg swelling   Haldol [haloperidol Lactate]    Neuroleptic Malignant syndrome   Hctz [hydrochlorothiazide] Nausea Only, Rash        Medication List     STOP taking these medications    metoprolol succinate 50 MG 24 hr tablet Commonly known as: TOPROL-XL    sulfamethoxazole-trimethoprim 800-160 MG tablet Commonly known as: BACTRIM DS       TAKE these medications    atorvastatin 20 MG tablet Commonly known as: LIPITOR Take 2 tablets (40 mg total) by mouth at bedtime.   Dextromethorphan-quiNIDine 20-10 MG capsule Commonly known as: NUEDEXTA Take 1 capsule by mouth at bedtime.   feeding supplement Liqd Take 237 mLs by mouth 3 (three) times daily between meals.   hydrALAZINE 10 MG tablet Commonly known as: APRESOLINE Take 1 tablet (10 mg total) by mouth 2 (two) times daily.   nystatin 100000 UNIT/ML suspension Commonly known as: MYCOSTATIN Take 5 mLs (500,000 Units total) by mouth 4 (four) times daily.   propranolol 10 MG tablet Commonly known as: INDERAL Take 1 tablet (10 mg total) by mouth 2 (two) times daily.   rivaroxaban 20 MG Tabs tablet Commonly known as: Xarelto TAKE 1 TABLET BY MOUTH EVERY DAY WITH DINNER        Contact information for follow-up providers     Johnson, Megan P, DO. Schedule an appointment as soon as possible for a visit in 1 week(s).   Specialty: Family Medicine Contact information: [place] [address] [phone]         [facility]. Schedule an appointment as soon as possible for a visit in 1 month(s).   Specialty: Neurology Contact information: [address] [employer] [place] [phone]             Contact information for after-discharge care     Destination     [facility] Preferred SNF .   Service: Skilled Nursing Contact information: [place] [address] [phone]                    Allergies  Allergen Reactions   Amlodipine     Leg swelling   Haldol [Haloperidol Lactate]     Neuroleptic Malignant syndrome    Hctz [Hydrochlorothiazide] Nausea Only and Rash      Procedures/Studies: CT HEAD WO CONTRAST  Result Date: 03/29/2021 CLINICAL  DATA:  Altered mental status. EXAM: CT HEAD WITHOUT CONTRAST TECHNIQUE: Contiguous axial images were obtained from the base of the skull through the vertex without intravenous contrast. COMPARISON:  None. FINDINGS: Brain: Old left frontal and occipital infarctions are noted. No mass effect or midline shift is noted. Ventricular size is within normal limits. There is no evidence of mass lesion, hemorrhage or acute infarction. Vascular: No hyperdense vessel or unexpected calcification. Skull: Normal. Negative for fracture or focal lesion. Sinuses/Orbits: No acute finding. Other: None. IMPRESSION: No acute intracranial abnormality seen. Electronically Signed   By: Marijo Conception M.D.   On: 03/29/2021 12:01   MR ANGIO HEAD WO CONTRAST  Result Date: 03/30/2021 CLINICAL DATA:  Stroke, follow-up EXAM: MRA HEAD WITHOUT CONTRAST TECHNIQUE: Angiographic images of the Circle of Willis were acquired using MRA technique without intravenous contrast. COMPARISON:  No pertinent prior exam.  FINDINGS: Anterior circulation: Intracranial internal carotid arteries are patent. Anterior and middle cerebral arteries are patent. Anterior communicating artery is present. Posterior circulation: Intracranial vertebral arteries, basilar artery, and posterior cerebral arteries are patent. A small right posterior communicating artery is present. Other: No aneurysm. IMPRESSION: No proximal intracranial vessel occlusion or significant stenosis. Electronically Signed   By: Macy Mis M.D.   On: 03/30/2021 11:52   MR BRAIN WO CONTRAST  Result Date: 03/30/2021 CLINICAL DATA:  Mental status changes. New and worsening confusion. Atrial fibrillation. Chronic vascular disease. EXAM: MRI HEAD WITHOUT CONTRAST TECHNIQUE: Multiplanar, multiecho pulse sequences of the brain and surrounding structures were obtained without intravenous contrast. COMPARISON:  Head CT yesterday. FINDINGS: Brain: Diffusion imaging shows an 8 mm acute infarction  affecting the cortical surface of the deep insula on the left. Second subcentimeter acute cortical infarction in the left parietal lobe. Findings suggest embolic disease in the left carotid circulation. No abnormality is seen affecting the brainstem. Old small vessel cerebellar infarction on the right. Cerebral hemispheres elsewhere show old cortical and subcortical infarction in the left occipital lobe and left frontal lobe and extensive chronic small-vessel ischemic changes of the hemispheric white matter. No sign mass, acute hemorrhage, hydrocephalus or extra-axial collection. Vascular: Major vessels at the base of the brain show flow. Skull and upper cervical spine: Negative Sinuses/Orbits: Mucosal thickening of the left maxillary sinus. Orbits negative. Other: None IMPRESSION: Two acute subcentimeter infarction is, affecting the deep insula on the left in the left parietal cortex. 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 No large vessel infarction. Old cortical infarctions in the left occipital lobe and left frontal lobe. Chronic small-vessel ischemic changes elsewhere, most prominently affecting the hemispheric white matter as outlined. Electronically Signed   By: Nelson Chimes M.D.   On: 03/30/2021 08:58   US Carotid Bilateral (at [facility] and [facility] only)  Result Date: 03/30/2021 CLINICAL DATA:  Hypertension, small acute strokes by MRI, concern for embolic source EXAM: BILATERAL CAROTID DUPLEX ULTRASOUND TECHNIQUE: Pearline Cables scale imaging, color Doppler and duplex ultrasound were performed of bilateral carotid and vertebral arteries in the neck. COMPARISON:  03/30/2021 FINDINGS: Criteria: Quantification of carotid stenosis is based on velocity parameters that correlate the residual internal carotid diameter with NASCET-based stenosis levels, using the diameter of the distal internal carotid lumen as the denominator for stenosis measurement. The following velocity measurements were  obtained: RIGHT ICA: 61/13 cm/sec CCA: 66/44 cm/sec SYSTOLIC ICA/CCA RATIO:  1.0 ECA: 108 cm/sec LEFT ICA: 94/28 cm/sec CCA: 03/47 cm/sec SYSTOLIC ICA/CCA RATIO:  1.0 ECA: 101 cm/sec RIGHT CAROTID ARTERY: Ala intimal thickening and minimal plaque formation. No hemodynamically significant right ICA stenosis, velocity elevation, or turbulent flow. Degree of narrowing less than 50%. RIGHT VERTEBRAL ARTERY:  Normal antegrade flow LEFT CAROTID ARTERY: Similar Korpela intimal thickening and minimal plaque formation. No hemodynamically significant left ICA stenosis, velocity elevation, or turbulent flow. LEFT VERTEBRAL ARTERY:  Normal antegrade flow IMPRESSION: Buechner carotid atherosclerosis. No hemodynamically significant ICA stenosis. Degree of narrowing less than 50% bilaterally by ultrasound criteria. Patent antegrade vertebral flow bilaterally Electronically Signed   By: Jerilynn Mages.  Shick M.D.   On: 03/30/2021 12:14   DG Chest Port 1 View  Result Date: 03/31/2021 CLINICAL DATA:  80 year old female with fever and altered mental status. EXAM: PORTABLE CHEST - 1 VIEW COMPARISON:  03/29/2021. FINDINGS: The patient is rotated to the left. Low lung volumes. The mediastinal contours are within normal limits. Stable cardiomegaly. Mild bibasilar subsegmental atelectasis. The  lungs are otherwise clear bilaterally without evidence of focal consolidation, pleural effusion, or pneumothorax. Atherosclerotic calcifications of the aortic arch. No acute osseous abnormality. IMPRESSION: Bibasilar subsegmental atelectasis. Stable cardiomegaly. Aortic Atherosclerosis (ICD10-I70.0). Electronically Signed   By: Ruthann Cancer MD   On: 03/31/2021 13:03   DG Chest Portable 1 View  Result Date: 03/29/2021 CLINICAL DATA:  Altered mental status EXAM: PORTABLE CHEST 1 VIEW COMPARISON:  None. FINDINGS: Cardiac shadow is enlarged. Aortic calcifications are noted. Lungs are well aerated bilaterally without focal infiltrate. Mild vascular prominence  is noted without edema. No bony abnormality is seen. IMPRESSION: No acute abnormality noted. Electronically Signed   By: Inez Catalina M.D.   On: 03/29/2021 12:50   ECHOCARDIOGRAM COMPLETE  Result Date: 03/31/2021    ECHOCARDIOGRAM REPORT   Patient Name:   [facility] Date of Exam: 03/31/2021 Medical Rec #:  [id number]    Height:       70.0 in Accession #:    [id number]   Weight:       222.7 lb Date of Birth:  1940/12/01     BSA:          2.185 m Patient Age:    80 years     BP:           110/51 mmHg Patient Gender: F            HR:           102 bpm. Exam Location:  [facility] Procedure: 2D Echo Indications:     Stroke I63.9  History:         Patient has prior history of Echocardiogram examinations, most                  recent 06/23/2015.  Sonographer:     Kathlen Brunswick RDCS Referring Phys:  [phone] [facility] Diagnosing Phys: Ida Rogue MD  Sonographer Comments: Image acquisition challenging due to uncooperative patient. IMPRESSIONS  1. Left ventricular ejection fraction, by estimation, is 60 to 65%. The left ventricle has normal function. The left ventricle has no regional wall motion abnormalities. There is mild left ventricular hypertrophy. Left ventricular diastolic parameters are indeterminate.  2. Right ventricular systolic function is normal. The right ventricular size is normal. There is mildly elevated pulmonary artery systolic pressure. The estimated right ventricular systolic pressure is 62.1 mmHg.  3. Left atrial size was severely dilated.  4. Right atrial size was severely dilated.  5. The mitral valve is normal in structure. Mild mitral valve regurgitation. FINDINGS  Left Ventricle: Left ventricular ejection fraction, by estimation, is 60 to 65%. The left ventricle has normal function. The left ventricle has no regional wall motion abnormalities. The left ventricular internal cavity size was normal in size. There is  mild left ventricular hypertrophy. Left ventricular diastolic parameters are  indeterminate. Right Ventricle: The right ventricular size is normal. No increase in right ventricular wall thickness. Right ventricular systolic function is normal. There is mildly elevated pulmonary artery systolic pressure. The tricuspid regurgitant velocity is 3.09  m/s, and with an assumed right atrial pressure of 5 mmHg, the estimated right ventricular systolic pressure is 30.8 mmHg. Left Atrium: Left atrial size was severely dilated. Right Atrium: Right atrial size was severely dilated. Pericardium: There is no evidence of pericardial effusion. Mitral Valve: The mitral valve is normal in structure. Moderate mitral annular calcification. Mild mitral valve regurgitation. No evidence of mitral valve stenosis. MV peak gradient, 8.9 mmHg. The mean mitral valve gradient  is 4.0 mmHg. Tricuspid Valve: The tricuspid valve is normal in structure. Tricuspid valve regurgitation is mild . No evidence of tricuspid stenosis. Aortic Valve: The aortic valve is normal in structure. Aortic valve regurgitation is not visualized. No aortic stenosis is present. Aortic valve peak gradient measures 17.8 mmHg. Pulmonic Valve: The pulmonic valve was normal in structure. Pulmonic valve regurgitation is not visualized. No evidence of pulmonic stenosis. Aorta: The aortic root is normal in size and structure. Venous: The inferior vena cava is normal in size with greater than 50% respiratory variability, suggesting right atrial pressure of 3 mmHg. IAS/Shunts: No atrial level shunt detected by color flow Doppler.  LEFT VENTRICLE PLAX 2D LVIDd:         4.28 cm  Diastology LVIDs:         2.78 cm  LV e' medial:    7.07 cm/s LV PW:         1.45 cm  LV E/e' medial:  19.7 LV IVS:        1.22 cm  LV e' lateral:   13.80 cm/s LVOT diam:     2.00 cm  LV E/e' lateral: 10.1 LV SV:         39 LV SV Index:   18 LVOT Area:     3.14 cm  RIGHT VENTRICLE RV Basal diam:  2.84 cm RV S prime:     20.00 cm/s TAPSE (M-mode): 1.6 cm LEFT ATRIUM              Index        RIGHT ATRIUM           Index LA diam:        6.20 cm  2.84 cm/m  RA Area:     33.10 cm LA Vol (A2C):   212.0 ml 97.04 ml/m RA Volume:   120.00 ml 54.93 ml/m LA Vol (A4C):   138.0 ml 63.16 ml/m LA Biplane Vol: 182.0 ml 83.30 ml/m  AORTIC VALVE                PULMONIC VALVE AV Area (Vmax): 1.50 cm    PV Vmax:       1.24 m/s AV Vmax:        211.00 cm/s PV Peak grad:  6.2 mmHg AV Peak Grad:   17.8 mmHg LVOT Vmax:      101.00 cm/s LVOT Vmean:     66.300 cm/s LVOT VTI:       0.125 m  AORTA Ao Root diam: 2.70 cm Ao Asc diam:  3.40 cm MITRAL VALVE                TRICUSPID VALVE MV Area (PHT): 2.99 cm     TV Peak grad:   35.0 mmHg MV Area VTI:   1.37 cm     TV Vmax:        2.96 m/s MV Peak grad:  8.9 mmHg     TR Peak grad:   38.2 mmHg MV Mean grad:  4.0 mmHg     TR Vmax:        309.00 cm/s MV Vmax:       1.49 m/s MV Vmean:      91.9 cm/s    SHUNTS MV Decel Time: 254 msec     Systemic VTI:  0.12 m MV E velocity: 139.00 cm/s  Systemic Diam: 2.00 cm Ida Rogue MD Electronically signed by Ida Rogue MD Signature Date/Time: 03/31/2021/12:59:03 PM    Final  Subjective: No headache, chest pain, cough, dyspnea.  No fever, no respiratory distress.  Still pleasantly confused.  Discharge Exam: Vitals:   04/13/21 0627 04/13/21 0754  BP: (!) 154/76 (!) 146/98  Pulse: 63 68  Resp: 18 17  Temp: 98.4 F (36.9 C) 98.1 F (36.7 C)  SpO2: 95% 98%   Vitals:   04/12/21 1728 04/12/21 2127 04/13/21 0627 04/13/21 0754  BP: (!) 145/97 (!) 146/86 (!) 154/76 (!) 146/98  Pulse: 63 64 63 68  Resp: 16 16 18 17   Temp: 98 F (36.7 C) 98 F (36.7 C) 98.4 F (36.9 C) 98.1 F (36.7 C)  TempSrc:  Oral    SpO2: 99% 99% 95% 98%  Weight:      Height:        General: Pt is alert, awake, not in acute distress, lying in bed, TV on, not really watching. Cardiovascular: RRR, nl S1-S2, no murmurs appreciated.   No LE edema.   Respiratory: Normal respiratory rate and rhythm.  CTAB without rales or  wheezes. Abdominal: Abdomen soft and non-tender.  No distension or HSM.   Neuro/Psych: Strength symmetric in upper and lower extremities.  Judgment and insight appear impaired by dementia.   The results of significant diagnostics from this hospitalization (including imaging, microbiology, ancillary and laboratory) are listed below for reference.     Microbiology: No results found for this or any previous visit (from the past 240 hour(s)).   Labs: BNP (last 3 results) No results for input(s): BNP in the last 8760 hours. Basic Metabolic Panel: Recent Labs  Lab 04/08/21 1523  NA 139  K 3.8  CL 104  CO2 27  GLUCOSE 102*  BUN 16  CREATININE 0.62  CALCIUM 8.6*   Liver Function Tests: No results for input(s): AST, ALT, ALKPHOS, BILITOT, PROT, ALBUMIN in the last 168 hours. No results for input(s): LIPASE, AMYLASE in the last 168 hours. No results for input(s): AMMONIA in the last 168 hours. CBC: Recent Labs  Lab 04/07/21 0447  WBC 7.7  HGB 12.0  HCT 36.4  MCV 89.9  PLT 399   Cardiac Enzymes: No results for input(s): CKTOTAL, CKMB, CKMBINDEX, TROPONINI in the last 168 hours. BNP: Invalid input(s): POCBNP CBG: No results for input(s): GLUCAP in the last 168 hours. D-Dimer No results for input(s): DDIMER in the last 72 hours. Hgb A1c No results for input(s): HGBA1C in the last 72 hours. Lipid Profile No results for input(s): CHOL, HDL, LDLCALC, TRIG, CHOLHDL, LDLDIRECT in the last 72 hours. Thyroid function studies No results for input(s): TSH, T4TOTAL, T3FREE, THYROIDAB in the last 72 hours.  Invalid input(s): FREET3 Anemia work up No results for input(s): VITAMINB12, FOLATE, FERRITIN, TIBC, IRON, RETICCTPCT in the last 72 hours. Urinalysis    Component Value Date/Time   COLORURINE AMBER (A) 03/31/2021 1702   APPEARANCEUR CLOUDY (A) 03/31/2021 1702   APPEARANCEUR Clear 01/05/2021 1055   LABSPEC 1.040 (H) 03/31/2021 1702   PHURINE 5.0 03/31/2021 1702    GLUCOSEU NEGATIVE 03/31/2021 1702   HGBUR SMALL (A) 03/31/2021 1702   BILIRUBINUR NEGATIVE 03/31/2021 1702   BILIRUBINUR Negative 01/05/2021 1055   KETONESUR 5 (A) 03/31/2021 1702   PROTEINUR 100 (A) 03/31/2021 1702   NITRITE NEGATIVE 03/31/2021 1702   LEUKOCYTESUR SMALL (A) 03/31/2021 1702   Sepsis Labs Invalid input(s): PROCALCITONIN,  WBC,  LACTICIDVEN Microbiology No results found for this or any previous visit (from the past 240 hour(s)).   Time coordinating discharge: 35 minutes The [place] controlled substances  registry was reviewed for this patient     30 Day Unplanned Readmission Risk Score    Flowsheet Row ED to Hosp-Admission (Current) from 03/29/2021 in [place] (1C)  30 Day Unplanned Readmission Risk Score (%) 10.47 Filed at 04/13/2021 0801       This score is the patient's risk of an unplanned readmission within 30 days of being discharged (0 -100%). The score is based on dignosis, age, lab data, medications, orders, and past utilization.   Low:  0-14.9   Medium: 15-21.9   High: 22-29.9   Extreme: 30 and above             SIGNED:   Edwin Dada, MD  [facility] 04/13/2021, 11:00 AM

## 2021-04-13 NOTE — Care Management Important Message (Signed)
Important Message  Patient Details  Name: FRANKLIN CLAPSADDLE MRN: [id number] Date of Birth: 01/13/1941   Medicare Important Message Given:  Yes  Daughter returned message.  Reviewed Medicare IM once more via phone.  Aware of Medicare right to appeal discharge. Confirmed she does have a copy of the Medicare IM to reference if needed.     Dannette Barbara 04/13/2021, 1:17 PM

## 2021-04-18 ENCOUNTER — Telehealth: Payer: Self-pay | Admitting: Family Medicine
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
## 2021-04-18 NOTE — Telephone Encounter (Signed)
Pts daughter called in stating that they have a court date hearing on Tuesday, 04/24/21. She is requesting all medical records-specifically for hospital visits and any physical and mental observation records, but everything overall. She would like it to be sent to the lawyer asap:  Alycia Rossetti [address] [place], [place] [address]  Fax: [phone] Email:[email or web address]     Please advise.

## 2021-04-20 DIAGNOSIS — I4891 Unspecified atrial fibrillation: Secondary | ICD-10-CM | POA: Diagnosis not present

## 2021-04-20 DIAGNOSIS — E785 Hyperlipidemia, unspecified: Secondary | ICD-10-CM | POA: Diagnosis not present

## 2021-04-20 DIAGNOSIS — R69 Illness, unspecified: Secondary | ICD-10-CM | POA: Diagnosis not present

## 2021-04-20 DIAGNOSIS — I10 Essential (primary) hypertension: Secondary | ICD-10-CM | POA: Diagnosis not present

## 2021-04-20 NOTE — Telephone Encounter (Signed)
Called [name] back advised of Dr Durenda Age message she is going to wait to see what happens Tuesday and will bring in documents if granted anything

## 2021-04-23 DIAGNOSIS — I10 Essential (primary) hypertension: Secondary | ICD-10-CM | POA: Diagnosis not present

## 2021-04-23 DIAGNOSIS — R69 Illness, unspecified: Secondary | ICD-10-CM | POA: Diagnosis not present

## 2021-04-23 DIAGNOSIS — I6340 Cerebral infarction due to embolism of unspecified cerebral artery: Secondary | ICD-10-CM | POA: Diagnosis not present

## 2021-04-26 ENCOUNTER — Telehealth: Payer: Self-pay | Admitting: Licensed Clinical Social Worker

## 2021-04-26 ENCOUNTER — Ambulatory Visit (INDEPENDENT_AMBULATORY_CARE_PROVIDER_SITE_OTHER): Payer: Medicare HMO | Admitting: Licensed Clinical Social Worker

## 2021-04-26 DIAGNOSIS — I639 Cerebral infarction, unspecified: Secondary | ICD-10-CM | POA: Diagnosis not present

## 2021-04-26 DIAGNOSIS — I10 Essential (primary) hypertension: Secondary | ICD-10-CM | POA: Diagnosis not present

## 2021-04-26 DIAGNOSIS — R413 Other amnesia: Secondary | ICD-10-CM

## 2021-04-26 NOTE — Telephone Encounter (Signed)
    Clinical Social Work  Care Management   Phone Outreach    04/26/2021 Name: Tiffanye Hartmann Swab MRN: [id number] DOB: September 12, 1941  [name] A Malkiewicz is a 80 y.o. year old female who is a primary care patient of [name], DO .   CCM [name] reached out to patient today by phone to introduce self, assess needs and offer Care Management services and interventions.    Telephone outreach was unsuccessful  Plan:CCM [name] will wait for return call. If no return call is received, Will reach out to patient again in the next 7 days .   Review of patient status, including review of consultants reports, relevant laboratory and other test results, and collaboration with appropriate care team members and the patient's provider was performed as part of comprehensive patient evaluation and provision of care management services.    Christa See, MSW, [email or web address] Phone [phone] 12:30 PM

## 2021-04-27 NOTE — Patient Instructions (Signed)
Visit Information   Goals Addressed             This Visit's Progress    Find Help in My Community       Timeframe:  Long-Range Goal Priority:  High Start Date:  04/26/21                           Expected End Date:  08/03/21                     Follow Up Date 05/14/21   Patient Goals/Self-Care Activities: Over the next 120 days Attend all scheduled appointments with provider Contact clinic with any questions or concerns              Patient verbalizes understanding of instructions provided today.   Telephone follow up appointment with care management team member scheduled for:05/14/21  Christa See, MSW, [email or web address] Phone [phone] 2:18 PM

## 2021-04-27 NOTE — Chronic Care Management (AMB) (Signed)
Chronic Care Management    Clinical Social Work Note  04/27/2021 Name: [name] MRN: [id number] DOB: [birth year]  [name] is a 80 y.o. year old female who is a primary care patient of [name], DO. The CCM team was consulted to assist the patient with chronic disease management and/or care coordination needs related to: Level of Care Concerns, Mental Health Counseling and Resources, and Caregiver Stress.   Engaged with patient's daughter in law, [name], by telephone for initial visit in response to provider referral for social work chronic care management and care coordination services.   Consent to Services:  The patient was given the following information about Chronic Care Management services today, agreed to services, and gave verbal consent: 1. CCM service includes personalized support from designated clinical staff supervised by the primary care provider, including individualized plan of care and coordination with other care providers 2. 24/7 contact phone numbers for assistance for urgent and routine care needs. 3. Service will only be billed when office clinical staff spend 20 minutes or more in a month to coordinate care. 4. Only one practitioner may furnish and bill the service in a calendar month. 5.The patient may stop CCM services at any time (effective at the end of the month) by phone call to the office staff. 6. The patient will be responsible for cost sharing (co-pay) of up to 20% of the service fee (after annual deductible is met). Patient agreed to services and consent obtained.  Patient agreed to services and consent obtained.   Assessment: Patient's daughter in law, [name], provided all information during this encounter.Family has filed for guardianship over patient. She is currently placed at [facility] to assist with rehab. Patient will relocate to eldest son's residence at discharge. See Care Plan below for interventions and patient self-care actives. Recent life  changes [name]: Management of chronic health conditions Recommendation: Patient may benefit from, and is in agreement to work with [name] to address care coordination needs and will continue to work with the clinical team to address health care and disease management related needs.  Follow up Plan: Patient would like continued follow-up.  CCM [name] will follow up with patient on 05/14/21. Patient will call office if needed prior to next encounter.    SDOH (Social Determinants of Health) assessments and interventions performed:  SDOH Interventions    Flowsheet Row Most Recent Value  SDOH Interventions   Food Insecurity Interventions Intervention Not Indicated  Housing Interventions Intervention Not Indicated  Transportation Interventions Intervention Not Indicated        Advanced Directives Status: Not addressed in this encounter.  CCM Care Plan  Allergies  Allergen Reactions   Amlodipine     Leg swelling   Haldol [Haloperidol Lactate]     Neuroleptic Malignant syndrome    Hctz [Hydrochlorothiazide] Nausea Only and Rash    Outpatient Encounter Medications as of 04/26/2021  Medication Sig   atorvastatin (LIPITOR) 20 MG tablet Take 2 tablets (40 mg total) by mouth at bedtime.   Dextromethorphan-quiNIDine (NUEDEXTA) 20-10 MG capsule Take 1 capsule by mouth at bedtime.   feeding supplement (ENSURE ENLIVE / ENSURE PLUS) LIQD Take 237 mLs by mouth 3 (three) times daily between meals.   hydrALAZINE (APRESOLINE) 10 MG tablet Take 1 tablet (10 mg total) by mouth 2 (two) times daily.   nystatin (MYCOSTATIN) 100000 UNIT/ML suspension Take 5 mLs (500,000 Units total) by mouth 4 (four) times daily.   propranolol (INDERAL) 10 MG tablet Take 1 tablet (  10 mg total) by mouth 2 (two) times daily.   rivaroxaban (XARELTO) 20 MG TABS tablet TAKE 1 TABLET BY MOUTH EVERY DAY WITH DINNER (Patient not taking: Reported on 03/29/2021)   No facility-administered encounter medications on file as of  04/26/2021.    Patient Active Problem List   Diagnosis Date Noted   Neuroleptic malignant syndrome 04/01/2021   Acute CVA (cerebrovascular accident) ([place]) 03/30/2021   Acute confusion 03/29/2021   Delirium 03/29/2021   Cellulitis 03/29/2021   Chronic venous insufficiency 10/14/2016   Atrial fibrillation ([place]) 06/15/2015   Rash 06/15/2015   HTN (hypertension) 04/11/2015   Osteoarthritis 04/11/2015    Conditions to be addressed/monitored: ; Level of care concerns, ADL IADL limitations, Mental Health Concerns , Family and relationship dysfunction, Cognitive Deficits, Memory Deficits, Inability to perform ADL's independently, and Inability to perform IADL's independently  Care Plan : General Social Work (Adult)  Updates made by [name], [name] since 04/27/2021 12:00 AM     Problem: Quality of Life (General Plan of Care)      Goal: Quality of Life Maintained   Start Date: 04/26/2021  This Visit's Progress: On track  Priority: High  Note:   Current barriers:    Level of care concerns, ADL IADL limitations, Family and relationship dysfunction, Memory Deficits, Inability to perform ADL's independently, and Inability to perform IADL's independently Clinical Goals: Patient will work with CCM [name] to address needs related to strengthening support to assist with unmet needs Clinical Interventions:  Assessment of needs, barriers , agencies contacted, as well as how impacting health Patient's daughter in law, [name] provided all hx during call Patient's eldest son and [name] went to court on 04/24/21 to file for Guardianship for concerns regarding patient's care under the supervision of her daughter. Per [name], patient was not taking medications and her medical needs were not made a priority, which resulted in recent hospitalization. They were encouraged to obtain a bond regarding patient's assets. Once obtained, they will provide paperwork to court to finalize Guardianship. Patient's  son will be th guardian of estate and he and [name] will be co-guardians of patient's medical care. Per the agreement, patient's son will have to update his siblings weekly and provide them with patient's medical records Patient has been admitted at [facility] since 04/13/21 and her mobility has improved. At discharge, patient will reside with [facility] and husband due to concerns that patient's residence is unfit (holes in floor) and places her at risk of injury CCM [name] reviewed upcoming appointments. Family are aware of Neurologist appointment scheduled for 05/08/21 to assess patient for cognitive impairement and memory concerns Collaboration with Noreene Larsson, Care Guide regarding PCP referral for CCM services to promote patient care  Collaboration with [name], DO regarding development and update of comprehensive plan of care as evidenced by provider attestation and co-signature Inter-disciplinary care team collaboration (see longitudinal plan of care) Review various resources and discussed options Discussed plans with patient for ongoing care management follow up and provided patient with direct contact information for care management team Clinical interventions provided:Solution-Focused Strategies, Active listening / Reflection utilized , Emotional Supportive Provided, Caregiver stress acknowledged , Verbalization of feelings encouraged , Discussed Guardianship and reviewed process , and [place] of Attorney  Patient Goals/Self-Care Activities: Over the next 120 days Attend all scheduled appointments with provider Contact clinic with any questions or concerns        Christa See, MSW, [place]  [email or web address] Phone [phone] 2:20 PM

## 2021-04-30 DIAGNOSIS — I4891 Unspecified atrial fibrillation: Secondary | ICD-10-CM | POA: Diagnosis not present

## 2021-04-30 DIAGNOSIS — I6340 Cerebral infarction due to embolism of unspecified cerebral artery: Secondary | ICD-10-CM | POA: Diagnosis not present

## 2021-04-30 DIAGNOSIS — R69 Illness, unspecified: Secondary | ICD-10-CM | POA: Diagnosis not present

## 2021-04-30 DIAGNOSIS — I10 Essential (primary) hypertension: Secondary | ICD-10-CM | POA: Diagnosis not present

## 2021-05-03 DIAGNOSIS — R69 Illness, unspecified: Secondary | ICD-10-CM | POA: Diagnosis not present

## 2021-05-03 DIAGNOSIS — I4891 Unspecified atrial fibrillation: Secondary | ICD-10-CM | POA: Diagnosis not present

## 2021-05-03 DIAGNOSIS — I6340 Cerebral infarction due to embolism of unspecified cerebral artery: Secondary | ICD-10-CM | POA: Diagnosis not present

## 2021-05-03 DIAGNOSIS — I10 Essential (primary) hypertension: Secondary | ICD-10-CM | POA: Diagnosis not present

## 2021-05-08 ENCOUNTER — Ambulatory Visit: Payer: Medicare HMO | Admitting: Neurology

## 2021-05-08 ENCOUNTER — Telehealth: Payer: Self-pay

## 2021-05-08 DIAGNOSIS — I6340 Cerebral infarction due to embolism of unspecified cerebral artery: Secondary | ICD-10-CM | POA: Diagnosis not present

## 2021-05-08 DIAGNOSIS — R69 Illness, unspecified: Secondary | ICD-10-CM | POA: Diagnosis not present

## 2021-05-08 DIAGNOSIS — I4891 Unspecified atrial fibrillation: Secondary | ICD-10-CM | POA: Diagnosis not present

## 2021-05-08 DIAGNOSIS — I10 Essential (primary) hypertension: Secondary | ICD-10-CM | POA: Diagnosis not present

## 2021-05-08 NOTE — Telephone Encounter (Signed)
Copied from [place] [id number]. Topic: General - Other >> May 08, 2021  2:30 PM Celene Kras wrote: Reason for CRM: Pts daughter in law calling stating that the pt is requesting to have a referral to another neurologist due to [facility] cancelling on them too many times. She states that they are looking for something that can see her asap. She states that Dr. Brigitte Pulse at [facility] was offered by [facility]. Please advise.

## 2021-05-10 NOTE — Telephone Encounter (Signed)
Can we see who can take her sooner?

## 2021-05-14 ENCOUNTER — Ambulatory Visit (INDEPENDENT_AMBULATORY_CARE_PROVIDER_SITE_OTHER): Payer: Medicare HMO | Admitting: Licensed Clinical Social Worker

## 2021-05-14 DIAGNOSIS — I6340 Cerebral infarction due to embolism of unspecified cerebral artery: Secondary | ICD-10-CM | POA: Diagnosis not present

## 2021-05-14 DIAGNOSIS — I480 Paroxysmal atrial fibrillation: Secondary | ICD-10-CM | POA: Diagnosis not present

## 2021-05-14 DIAGNOSIS — I639 Cerebral infarction, unspecified: Secondary | ICD-10-CM | POA: Diagnosis not present

## 2021-05-14 DIAGNOSIS — I10 Essential (primary) hypertension: Secondary | ICD-10-CM

## 2021-05-14 DIAGNOSIS — I4891 Unspecified atrial fibrillation: Secondary | ICD-10-CM | POA: Diagnosis not present

## 2021-05-14 DIAGNOSIS — R69 Illness, unspecified: Secondary | ICD-10-CM | POA: Diagnosis not present

## 2021-05-14 DIAGNOSIS — R413 Other amnesia: Secondary | ICD-10-CM

## 2021-05-14 NOTE — Chronic Care Management (AMB) (Signed)
Chronic Care Management    Clinical Social Work Note  05/14/2021 Name: [name] MRN: [id number] DOB: [birth year]  [name] is a 80 y.o. year old female who is a primary care patient of [name], DO. The CCM team was consulted to assist the patient with chronic disease management and/or care coordination needs related to: Level of Care Concerns and Caregiver Stress.   Engaged with patient's daughter in law, [name], by telephone for follow up visit in response to provider referral for social work chronic care management and care coordination services.   Consent to Services:  The patient was given information about Chronic Care Management services, agreed to services, and gave verbal consent prior to initiation of services.  Please see initial visit note for detailed documentation.   Patient agreed to services and consent obtained.   Assessment: Patient's daughter in law, [name], provided all information during this encounter. Patient was discharged from [facility] today and will reside with adult son and daughter in law. They have appointments with Neurology to assist with memory concerns. CCM [name] provided resources to assist with caregiver stress. See Care Plan below for interventions and patient self-care actives. Recent life changes [name]: Management of health condition Recommendation: Patient may benefit from, and is in agreement to work with [name] to address care coordination needs and will continue to work with the clinical team to address health care and disease management related needs.  Follow up Plan: Patient would like continued follow-up from CCM [name] .  Follow up scheduled in 07/23/21. Patient will call office if needed prior to next encounter.   SDOH (Social Determinants of Health) assessments and interventions performed:    Advanced Directives Status: Not addressed in this encounter.  CCM Care Plan  Allergies  Allergen Reactions   Amlodipine     Leg swelling    Haldol [Haloperidol Lactate]     Neuroleptic Malignant syndrome    Hctz [Hydrochlorothiazide] Nausea Only and Rash    Outpatient Encounter Medications as of 05/14/2021  Medication Sig   atorvastatin (LIPITOR) 20 MG tablet Take 2 tablets (40 mg total) by mouth at bedtime.   Dextromethorphan-quiNIDine (NUEDEXTA) 20-10 MG capsule Take 1 capsule by mouth at bedtime.   feeding supplement (ENSURE ENLIVE / ENSURE PLUS) LIQD Take 237 mLs by mouth 3 (three) times daily between meals.   hydrALAZINE (APRESOLINE) 10 MG tablet Take 1 tablet (10 mg total) by mouth 2 (two) times daily.   nystatin (MYCOSTATIN) 100000 UNIT/ML suspension Take 5 mLs (500,000 Units total) by mouth 4 (four) times daily.   propranolol (INDERAL) 10 MG tablet Take 1 tablet (10 mg total) by mouth 2 (two) times daily.   rivaroxaban (XARELTO) 20 MG TABS tablet TAKE 1 TABLET BY MOUTH EVERY DAY WITH DINNER (Patient not taking: Reported on 03/29/2021)   No facility-administered encounter medications on file as of 05/14/2021.    Patient Active Problem List   Diagnosis Date Noted   Neuroleptic malignant syndrome 04/01/2021   Acute CVA (cerebrovascular accident) ([place]) 03/30/2021   Acute confusion 03/29/2021   Delirium 03/29/2021   Cellulitis 03/29/2021   Chronic venous insufficiency 10/14/2016   Atrial fibrillation ([place]) 06/15/2015   Rash 06/15/2015   HTN (hypertension) 04/11/2015   Osteoarthritis 04/11/2015    Conditions to be addressed/monitored: Dementia; Cognitive Deficits and Memory Deficits  Care Plan : General Social Work (Adult)  Updates made by Rebekah Chesterfield, [name] since 05/14/2021 12:00 AM     Problem: Quality of Life (General Plan  of Care)      Goal: Quality of Life Maintained   Start Date: 04/26/2021  This Visit's Progress: On track  Recent Progress: On track  Priority: High  Note:   Current barriers:    Level of care concerns, ADL IADL limitations, Family and relationship dysfunction, Memory Deficits,  Inability to perform ADL's independently, and Inability to perform IADL's independently Clinical Goals: Patient will work with CCM [name] to address needs related to strengthening support to assist with unmet needs Clinical Interventions:  Assessment of needs, barriers , agencies contacted, as well as how impacting health Patient's daughter in law, [name] provided all hx during call Patient was discharged from [facility] this afternoon. She will remain with her son and daughter in law; however, she doesn't fully understand due to cognitive impairment Per daughter in law, sometimes patient retains and understands information and other times not. There are times when patient becomes upset easily so family has been mindful of how to relay information to her Patient was unable to attend Neurologist appointment scheduled on 05/08/21 with [facility] due to [place]. DIL shared that [facility]'s transportation service did not bring patient to appointment timely, resulting in cancellation. She has called approximately 4 different Neurologists; however, the soonest appointments were scheduled with [facility] for 07/30/21 and [facility] 07/10/21 Patient is on the wait-lists for both specialists, in case of any cancellations Family continues to re-direct patient often by changing subject. They try not confront patient's delusions Patient has been enjoying watching tv and spending time with family Family are working on obtaining a sitter to assist with supervision of patient while DIL works and/or runs errands. CCM [name] provided options through [employer], provided through email. Family will also reach out to family and friends for assistance and additional support CCM [name] discussed strategies to promote patient safety while in their home. Family plans on getting alarms on door to assist with monitoring patient to prevent wandering Patient's eldest son and [name] went to court on 04/24/21 to file for Guardianship for concerns  regarding patient's care under the supervision of her daughter. Per [name], patient was not taking medications and her medical needs were not made a priority, which resulted in recent hospitalization. They were encouraged to obtain a bond regarding patient's assets. Once obtained, they will provide paperwork to court to finalize Guardianship. Patient's son will be th guardian of estate and he and [name] will be co-guardians of patient's medical care. Per the agreement, patient's son will have to update his siblings weekly and provide them with patient's medical records CCM [name] reviewed upcoming appointments Collaboration with [name], DO regarding development and update of comprehensive plan of care as evidenced by provider attestation and co-signature Inter-disciplinary care team collaboration (see longitudinal plan of care) Discussed plans with patient for ongoing care management follow up and provided patient with direct contact information for care management team Clinical interventions provided:Solution-Focused Strategies, Active listening / Reflection utilized , Emotional Supportive Provided, Caregiver stress acknowledged , Verbalization of feelings encouraged , Discussed Guardianship and reviewed process , and [place] of Attorney  Patient Goals/Self-Care Activities: Over the next 120 days Attend all scheduled appointments with provider Contact clinic with any questions or concerns Utilize resources provided          Christa See, MSW, [email or web address] Phone [phone] 3:46 PM

## 2021-05-14 NOTE — Patient Instructions (Signed)
Visit Information   Goals Addressed             This Visit's Progress    Find Help in My Community   On track     Timeframe:  Long-Range Goal Priority:  High Start Date:  04/26/21                           Expected End Date:  09/03/21                     Follow Up Date 07/22/21   Patient Goals/Self-Care Activities: Over the next 120 days Attend all scheduled appointments with provider Contact clinic with any questions or concerns Utilize resources provided              Patient verbalizes understanding of instructions provided today.   Telephone follow up appointment with care management team member scheduled for:07/23/21  Christa See, MSW, [email or web address] Phone [phone] 3:50 PM

## 2021-05-20 DIAGNOSIS — E785 Hyperlipidemia, unspecified: Secondary | ICD-10-CM | POA: Diagnosis not present

## 2021-05-20 DIAGNOSIS — I6340 Cerebral infarction due to embolism of unspecified cerebral artery: Secondary | ICD-10-CM | POA: Diagnosis not present

## 2021-05-20 DIAGNOSIS — I4891 Unspecified atrial fibrillation: Secondary | ICD-10-CM | POA: Diagnosis not present

## 2021-05-20 DIAGNOSIS — I10 Essential (primary) hypertension: Secondary | ICD-10-CM | POA: Diagnosis not present

## 2021-05-20 DIAGNOSIS — I872 Venous insufficiency (chronic) (peripheral): Secondary | ICD-10-CM | POA: Diagnosis not present

## 2021-05-20 DIAGNOSIS — R69 Illness, unspecified: Secondary | ICD-10-CM | POA: Diagnosis not present

## 2021-05-20 DIAGNOSIS — I69318 Other symptoms and signs involving cognitive functions following cerebral infarction: Secondary | ICD-10-CM | POA: Diagnosis not present

## 2021-05-20 DIAGNOSIS — Z87891 Personal history of nicotine dependence: Secondary | ICD-10-CM | POA: Diagnosis not present

## 2021-05-20 DIAGNOSIS — G210 Malignant neuroleptic syndrome: Secondary | ICD-10-CM | POA: Diagnosis not present

## 2021-05-20 DIAGNOSIS — Z7901 Long term (current) use of anticoagulants: Secondary | ICD-10-CM | POA: Diagnosis not present

## 2021-05-21 ENCOUNTER — Telehealth: Payer: Self-pay | Admitting: Family Medicine

## 2021-05-21 DIAGNOSIS — E785 Hyperlipidemia, unspecified: Secondary | ICD-10-CM | POA: Diagnosis not present

## 2021-05-21 DIAGNOSIS — G210 Malignant neuroleptic syndrome: Secondary | ICD-10-CM | POA: Diagnosis not present

## 2021-05-21 DIAGNOSIS — I69318 Other symptoms and signs involving cognitive functions following cerebral infarction: Secondary | ICD-10-CM | POA: Diagnosis not present

## 2021-05-21 DIAGNOSIS — R69 Illness, unspecified: Secondary | ICD-10-CM | POA: Diagnosis not present

## 2021-05-21 DIAGNOSIS — I4891 Unspecified atrial fibrillation: Secondary | ICD-10-CM | POA: Diagnosis not present

## 2021-05-21 DIAGNOSIS — Z7901 Long term (current) use of anticoagulants: Secondary | ICD-10-CM | POA: Diagnosis not present

## 2021-05-21 DIAGNOSIS — I872 Venous insufficiency (chronic) (peripheral): Secondary | ICD-10-CM | POA: Diagnosis not present

## 2021-05-21 DIAGNOSIS — Z87891 Personal history of nicotine dependence: Secondary | ICD-10-CM | POA: Diagnosis not present

## 2021-05-21 DIAGNOSIS — I10 Essential (primary) hypertension: Secondary | ICD-10-CM | POA: Diagnosis not present

## 2021-05-21 NOTE — Telephone Encounter (Signed)
OK for verbal orders.   Patient had mild cognitive impairment prior to her stroke. Went to ER with significant cognitive impairment. I have not seen her since her hospitalization. Did not have a diagnosis of dementia prior to hospitalization.

## 2021-05-21 NOTE — Telephone Encounter (Signed)
Home Health Verbal Orders - Caller/Agency: Tonya// [facility] [employer] Number: [phone] secure Requesting OT/PT/Skilled Nursing/Social Work/Speech Therapy: Nursing Frequency: 1wk 3, 1 every 2 for 6, 1PRN   She states that the pt recently had a stroke and that the pts cognition was affected and she needs the provider to confirm. She is also needing to know if the pts diagnosis for dementia is old. Please advise.

## 2021-05-21 NOTE — Telephone Encounter (Signed)
Returned call to [facility] no answer left VM with provider advise.

## 2021-05-24 ENCOUNTER — Telehealth: Payer: Self-pay

## 2021-05-24 ENCOUNTER — Encounter: Payer: Self-pay | Admitting: Family Medicine

## 2021-05-24 ENCOUNTER — Other Ambulatory Visit: Payer: Self-pay

## 2021-05-24 ENCOUNTER — Ambulatory Visit (INDEPENDENT_AMBULATORY_CARE_PROVIDER_SITE_OTHER): Payer: Medicare HMO | Admitting: Family Medicine

## 2021-05-24 VITALS — BP 151/86 | HR 67 | Temp 97.7°F | Ht 65.8 in | Wt 202.6 lb

## 2021-05-24 DIAGNOSIS — R69 Illness, unspecified: Secondary | ICD-10-CM | POA: Diagnosis not present

## 2021-05-24 DIAGNOSIS — I6930 Unspecified sequelae of cerebral infarction: Secondary | ICD-10-CM | POA: Diagnosis not present

## 2021-05-24 DIAGNOSIS — R413 Other amnesia: Secondary | ICD-10-CM

## 2021-05-24 DIAGNOSIS — I480 Paroxysmal atrial fibrillation: Secondary | ICD-10-CM

## 2021-05-24 DIAGNOSIS — F0390 Unspecified dementia without behavioral disturbance: Secondary | ICD-10-CM

## 2021-05-24 DIAGNOSIS — I10 Essential (primary) hypertension: Secondary | ICD-10-CM

## 2021-05-24 DIAGNOSIS — I872 Venous insufficiency (chronic) (peripheral): Secondary | ICD-10-CM

## 2021-05-24 DIAGNOSIS — L609 Nail disorder, unspecified: Secondary | ICD-10-CM | POA: Diagnosis not present

## 2021-05-24 MED ORDER — RIVAROXABAN 20 MG PO TABS: ORAL_TABLET | ORAL | 1 refills | Status: DC

## 2021-05-24 MED ORDER — DEXTROMETHORPHAN-QUINIDINE 20-10 MG PO CAPS: 1.0000 | ORAL_CAPSULE | Freq: Every day | ORAL | 1 refills | Status: DC

## 2021-05-24 MED ORDER — PROPRANOLOL HCL 10 MG PO TABS: 10.0000 mg | ORAL_TABLET | Freq: Two times a day (BID) | ORAL | 1 refills | Status: DC

## 2021-05-24 MED ORDER — HYDRALAZINE HCL 10 MG PO TABS: 10.0000 mg | ORAL_TABLET | Freq: Two times a day (BID) | ORAL | 1 refills | Status: DC

## 2021-05-24 MED ORDER — ATORVASTATIN CALCIUM 20 MG PO TABS: 40.0000 mg | ORAL_TABLET | Freq: Every day | ORAL | 1 refills | Status: DC

## 2021-05-24 MED ORDER — DICLOFENAC SODIUM 1 % EX GEL: 4.0000 g | Freq: Four times a day (QID) | CUTANEOUS | 12 refills | Status: DC

## 2021-05-24 NOTE — Patient Instructions (Signed)
Propranolol- slows HR, do not give if pulse less than 60- OK to stay off, only slows heart down Hydralazine- lowers blood pressure, do not give if blood pressure is less than 120/60- OK also to stay off of if BP is low so she doesn't pass out or get dizzy

## 2021-05-24 NOTE — Telephone Encounter (Signed)
PA for Nuedexta approved via CoverMyMeds

## 2021-05-24 NOTE — Telephone Encounter (Signed)
PA for Nuedexta 20-10MG  Capsules initiated Key: [id number]  Waiting on response.

## 2021-05-24 NOTE — Telephone Encounter (Signed)
Telephone message already generated earlier and sent to provider for advise. Please see other telephone message.

## 2021-05-24 NOTE — Telephone Encounter (Signed)
Copied from [place] [phone]. Topic: Quick Communication - Rx Refill/Question >> May 24, 2021  4:23 PM Pawlus, Brayton Layman A wrote: Pharmacist called in needed some clarification regarding the script sent in for atorvastatin (LIPITOR) 20 MG tablet, please call back.

## 2021-05-24 NOTE — Telephone Encounter (Signed)
Copied from [place] [phone]. Topic: General - Other >> May 24, 2021 12:34 PM Yvette Rack wrote: Reason for CRM: Arbie Cookey with [facility] asked if the Rx for atorvastatin (LIPITOR) 20 MG tablet could be changed to a 90 day supply of atorvastatin (LIPITOR) 40 MG tablet. Cb# [id number]

## 2021-05-24 NOTE — Progress Notes (Signed)
BP (!) 151/86 (BP Location: Left Arm, Cuff Size: Normal)   Pulse 67   Temp 97.7 F (36.5 C) (Oral)   Ht 5' 5.8" (1.671 m)   Wt 202 lb 9.6 oz (91.9 kg)   SpO2 97%   BMI 32.90 kg/m    Subjective:    Patient ID: [name], female    DOB: Aug 05, 1941, 80 y.o.   MRN: [id number]  HPI: [name] is a 80 y.o. female  Chief Complaint  Patient presents with   Hospitalization Follow-up   [name] was hospitalized in May with an acute CVA and delirium. She was discharged on 04/13/21.  Has been out of the rehab on 7/11  [facility] Follow up.   Hospital/Facility: [facility] SNF, [facility] D/C Physician: Dr. Loleta Books D/C Date: 04/13/21 and 05/14/21  Records Requested: 05/24/21 Records Received: 05/24/21 Records Reviewed: 05/24/21  Diagnoses on Discharge: Acute embolic stroke, acute metabolic encelphalopathy superimposed on dementia  Date of interactive Contact within 48 hours of discharge: 04/12/21 Contact was through: direct  Date of 7 day or 14 day face-to-face visit:  05/24/21  within 14 days  Outpatient Encounter Medications as of 05/24/2021  Medication Sig   diclofenac Sodium (VOLTAREN) 1 % GEL Apply 4 g topically 4 (four) times daily.   nystatin (MYCOSTATIN) 100000 UNIT/ML suspension Take 5 mLs (500,000 Units total) by mouth 4 (four) times daily.   [DISCONTINUED] atorvastatin (LIPITOR) 20 MG tablet Take 2 tablets (40 mg total) by mouth at bedtime.   [DISCONTINUED] Dextromethorphan-quiNIDine (NUEDEXTA) 20-10 MG capsule Take 1 capsule by mouth at bedtime.   [DISCONTINUED] hydrALAZINE (APRESOLINE) 10 MG tablet Take 1 tablet (10 mg total) by mouth 2 (two) times daily.   [DISCONTINUED] propranolol (INDERAL) 10 MG tablet Take 1 tablet (10 mg total) by mouth 2 (two) times daily.   [DISCONTINUED] rivaroxaban (XARELTO) 20 MG TABS tablet TAKE 1 TABLET BY MOUTH EVERY DAY WITH DINNER   Dextromethorphan-quiNIDine (NUEDEXTA) 20-10 MG capsule Take 1 capsule by mouth at bedtime.    hydrALAZINE (APRESOLINE) 10 MG tablet Take 1 tablet (10 mg total) by mouth 2 (two) times daily.   propranolol (INDERAL) 10 MG tablet Take 1 tablet (10 mg total) by mouth 2 (two) times daily.   rivaroxaban (XARELTO) 20 MG TABS tablet TAKE 1 TABLET BY MOUTH EVERY DAY WITH DINNER   [DISCONTINUED] atorvastatin (LIPITOR) 20 MG tablet Take 2 tablets (40 mg total) by mouth at bedtime.   [DISCONTINUED] feeding supplement (ENSURE ENLIVE / ENSURE PLUS) LIQD Take 237 mLs by mouth 3 (three) times daily between meals.   No facility-administered encounter medications on file as of 05/24/2021.  Per Hospitalist: "Acute embolic stroke Patient was admitted for altered mental status.  Electrolytes were normal, she was treated with fluids and antibiotics for possible mild cellulitis.  TSH and B12 normal.     MRI was obtained that then showed multiple infarcts.   Echo and carotids unremarkable. Started on atorvastatin and resume Xarelto, with which she had been noncompliant due to memory loss.     tPA not given because outside window.  Dysphagia screen ordered in ER.  PT eval ordered.  Nonsmoker.       Acute metabolic encephalopathy superimposed on dementia Patient presented with acute confusion, this in retrospect appears to be from stroke, less likely UTI or cellulitis.   Of note, patient has had concerns by family for many months that hse had memory loss, and had been referred for neuropsych testing previously.  Given she has now had resolution of her metabolic insults, but remains confused, I suspect she has dementia   At present she thinks she is in a grocery store in [place].  She is not able to make even simple decisions of her own capacity -Recommend MOCA/MMSE testing at her SNF and referral for more extensive neuropsych testing   While here, she was started on Nuedexta and her metoprolol was changed to propranolol, both to ameliorate agitation in an unfamiliar setting.        Hypertension Metoprolol changed to propranolol for synergy with side use for agitation.   Neuroleptic malignant syndrome Treated with Haldol for agitation and developed symptoms of fever, diaphoresis, and rigidity, thought to be due to Haldol.  Haldol discontinued and symptoms resolved.   Suspected UTI Scant Pseudomonas in urine, treated with 3 days meropenem and ceftazidime, treatment completed, no further signs of infection.   Permanent atrial fibrillation Last PCP note state she had stopped this.  Here, her daughter in law suspects that she was not taking it.   She was resumed on Xarelto.  BB was continued, switched to Propranolol.   Chronic venous insufficiency, suspected mild cellulitis Possible cellulitis, treated with adequate course of antibiotics, no signs of infection at this point   Hypokalemia Supplemented, resolved   Obesity BMI 31"  Diagnostic Tests Reviewed: CLINICAL DATA:  Altered mental status.   EXAM: CT HEAD WITHOUT CONTRAST   TECHNIQUE: Contiguous axial images were obtained from the base of the skull through the vertex without intravenous contrast.   COMPARISON:  None.   FINDINGS: Brain: Old left frontal and occipital infarctions are noted. No mass effect or midline shift is noted. Ventricular size is within normal limits. There is no evidence of mass lesion, hemorrhage or acute infarction.   Vascular: No hyperdense vessel or unexpected calcification.   Skull: Normal. Negative for fracture or focal lesion.   Sinuses/Orbits: No acute finding.   Other: None.   IMPRESSION: No acute intracranial abnormality seen.  CLINICAL DATA:  Altered mental status   EXAM: PORTABLE CHEST 1 VIEW   COMPARISON:  None.   FINDINGS: Cardiac shadow is enlarged. Aortic calcifications are noted. Lungs are well aerated bilaterally without focal infiltrate. Mild vascular prominence is noted without edema. No bony abnormality is seen.   IMPRESSION: No acute  abnormality noted.  CLINICAL DATA:  Mental status changes. New and worsening confusion. Atrial fibrillation. Chronic vascular disease.   EXAM: MRI HEAD WITHOUT CONTRAST   TECHNIQUE: Multiplanar, multiecho pulse sequences of the brain and surrounding structures were obtained without intravenous contrast.   COMPARISON:  Head CT yesterday.   FINDINGS: Brain: Diffusion imaging shows an 8 mm acute infarction affecting the cortical surface of the deep insula on the left. Second subcentimeter acute cortical infarction in the left parietal lobe. Findings suggest embolic disease in the left carotid circulation. No abnormality is seen affecting the brainstem. Old small vessel cerebellar infarction on the right. Cerebral hemispheres elsewhere show old cortical and subcortical infarction in the left occipital lobe and left frontal lobe and extensive chronic small-vessel ischemic changes of the hemispheric white matter. No sign mass, acute hemorrhage, hydrocephalus or extra-axial collection.   Vascular: Major vessels at the base of the brain show flow.   Skull and upper cervical spine: Negative   Sinuses/Orbits: Mucosal thickening of the left maxillary sinus. Orbits negative.   Other: None   IMPRESSION: Two acute subcentimeter infarction is, affecting the deep insula on the left in the left parietal  cortex. 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 No large vessel infarction.   Old cortical infarctions in the left occipital lobe and left frontal lobe.   Chronic small-vessel ischemic changes elsewhere, most prominently affecting the hemispheric white matter as outlined.   CLINICAL DATA:  Stroke, follow-up   EXAM: MRA HEAD WITHOUT CONTRAST   TECHNIQUE: Angiographic images of the Circle of Willis were acquired using MRA technique without intravenous contrast.   COMPARISON:  No pertinent prior exam.   FINDINGS: Anterior circulation: Intracranial  internal carotid arteries are patent. Anterior and middle cerebral arteries are patent. Anterior communicating artery is present.   Posterior circulation: Intracranial vertebral arteries, basilar artery, and posterior cerebral arteries are patent. A small right posterior communicating artery is present.   Other: No aneurysm.   IMPRESSION: No proximal intracranial vessel occlusion or significant stenosis.  CLINICAL DATA:  Hypertension, small acute strokes by MRI, concern for embolic source   EXAM: BILATERAL CAROTID DUPLEX ULTRASOUND   TECHNIQUE: Pearline Cables scale imaging, color Doppler and duplex ultrasound were performed of bilateral carotid and vertebral arteries in the neck.   COMPARISON:  03/30/2021   FINDINGS: Criteria: Quantification of carotid stenosis is based on velocity parameters that correlate the residual internal carotid diameter with NASCET-based stenosis levels, using the diameter of the distal internal carotid lumen as the denominator for stenosis measurement.   The following velocity measurements were obtained:   RIGHT   ICA: 61/13 cm/sec   CCA: 78/24 cm/sec   SYSTOLIC ICA/CCA RATIO:  1.0   ECA: 108 cm/sec   LEFT   ICA: 94/28 cm/sec   CCA: 23/53 cm/sec   SYSTOLIC ICA/CCA RATIO:  1.0   ECA: 101 cm/sec   RIGHT CAROTID ARTERY: [place] intimal thickening and minimal plaque formation. No hemodynamically significant right ICA stenosis, velocity elevation, or turbulent flow. Degree of narrowing less than 50%.   RIGHT VERTEBRAL ARTERY:  Normal antegrade flow   LEFT CAROTID ARTERY: Similar Danzy intimal thickening and minimal plaque formation. No hemodynamically significant left ICA stenosis, velocity elevation, or turbulent flow.   LEFT VERTEBRAL ARTERY:  Normal antegrade flow   IMPRESSION: [place] carotid atherosclerosis. No hemodynamically significant ICA stenosis. Degree of narrowing less than 50% bilaterally by ultrasound criteria.   Patent  antegrade vertebral flow bilaterally  CLINICAL DATA:  80 year old female with fever and altered mental status.   EXAM: PORTABLE CHEST - 1 VIEW   COMPARISON:  03/29/2021.   FINDINGS: The patient is rotated to the left. Low lung volumes. The mediastinal contours are within normal limits. Stable cardiomegaly. Mild bibasilar subsegmental atelectasis. The lungs are otherwise clear bilaterally without evidence of focal consolidation, pleural effusion, or pneumothorax. Atherosclerotic calcifications of the aortic arch. No acute osseous abnormality.   IMPRESSION: Bibasilar subsegmental atelectasis. Stable cardiomegaly. Aortic Atherosclerosis (ICD10-I70.0).  Disposition: Home with home health  Consults: Neurology, psychiatry  Discharge Instructions:  Follow up with Neurology in 4-6 weeks for stroke Follow up with PCP within 1 week of SNF discharge Please obtain do Speech therapy evaluation for cognitive eval while in SNF and fax to PCP Dr. Wynetta Emery Please keep appointment previously scheduled for neuropsychiatric testing for dementia   Disease/illness Education: discussed today  Home Health/Community Services Discussions/Referrals: in place  Establishment or re-establishment of referral orders for community resources:  in place  Discussion with other health care providers: N/A  Assessment and Support of treatment regimen adherence: [place] with: daughter in law  Education for self-management, independent living, and ADLs:  discussed today  Since getting out of the hospital, [name] has been doing OK. She is now living with her son and daughter in law and is not being left alone. Her daughter in law is making sure she is bathed and eating. Her legs have looked much better. No more redness or swelling. She has had 1 episode of confusion where she tried to go home, but they were able to explain it to her that she cannot go home. She is able to speak again. Her son  and daughter in law had to go to court to get guardianship of her. They are doing OK with her and had family support with her granddaughters as well. No other concerns.   Relevant past medical, surgical, family and social history reviewed and updated as indicated. Interim medical history since our last visit reviewed. Allergies and medications reviewed and updated.  Review of Systems  Constitutional: Negative.   Respiratory: Negative.    Cardiovascular: Negative.   Gastrointestinal: Negative.   Musculoskeletal: Negative.   Skin: Negative.   Neurological: Negative.   Psychiatric/Behavioral:  Positive for confusion. Negative for agitation, behavioral problems, decreased concentration, dysphoric mood, hallucinations, self-injury, sleep disturbance and suicidal ideas. The patient is not nervous/anxious and is not hyperactive.    Per HPI unless specifically indicated above     Objective:    BP (!) 151/86 (BP Location: Left Arm, Cuff Size: Normal)   Pulse 67   Temp 97.7 F (36.5 C) (Oral)   Ht 5' 5.8" (1.671 m)   Wt 202 lb 9.6 oz (91.9 kg)   SpO2 97%   BMI 32.90 kg/m   Wt Readings from Last 3 Encounters:  05/24/21 202 lb 9.6 oz (91.9 kg)  04/01/21 219 lb 5.7 oz (99.5 kg)  03/29/21 223 lb 3.2 oz (101.2 kg)    Physical Exam Vitals and nursing note reviewed.  Constitutional:      General: She is not in acute distress.    Appearance: Normal appearance. She is not ill-appearing, toxic-appearing or diaphoretic.  HENT:     Head: Normocephalic and atraumatic.     Right Ear: External ear normal.     Left Ear: External ear normal.     Nose: Nose normal.     Mouth/Throat:     Mouth: Mucous membranes are moist.     Pharynx: Oropharynx is clear.  Eyes:     General: No scleral icterus.       Right eye: No discharge.        Left eye: No discharge.     Extraocular Movements: Extraocular movements intact.     Conjunctiva/sclera: Conjunctivae normal.     Pupils: Pupils are equal, round,  and reactive to light.  Cardiovascular:     Rate and Rhythm: Normal rate and regular rhythm.     Pulses: Normal pulses.     Heart sounds: Normal heart sounds. No murmur heard.   No friction rub. No gallop.  Pulmonary:     Effort: Pulmonary effort is normal. No respiratory distress.     Breath sounds: Normal breath sounds. No stridor. No wheezing, rhonchi or rales.  Chest:     Chest wall: No tenderness.  Musculoskeletal:        General: Normal range of motion.     Cervical back: Normal range of motion and neck supple.  Skin:    General: Skin is warm and dry.     Capillary Refill: Capillary refill takes less than 2 seconds.  Coloration: Skin is not jaundiced or pale.     Findings: No bruising, erythema, lesion or rash.  Neurological:     General: No focal deficit present.     Mental Status: She is alert and oriented to person, place, and time. Mental status is at baseline.  Psychiatric:        Mood and Affect: Mood normal.        Behavior: Behavior normal.        Thought Content: Thought content normal.        Cognition and Memory: Memory is impaired. She exhibits impaired recent memory.        Judgment: Judgment normal.    Results for orders placed or performed in visit on 05/24/21  CBC with Differential/Platelet  Result Value Ref Range   WBC 7.2 3.4 - 10.8 x10E3/uL   RBC 4.69 3.77 - 5.28 x10E6/uL   Hemoglobin 13.9 11.1 - 15.9 g/dL   Hematocrit 43.6 34.0 - 46.6 %   MCV 93 79 - 97 fL   MCH 29.6 26.6 - 33.0 pg   MCHC 31.9 31.5 - 35.7 g/dL   RDW 14.3 11.7 - 15.4 %   Platelets 348 150 - 450 x10E3/uL   Neutrophils 65 Not Estab. %   Lymphs 24 Not Estab. %   Monocytes 9 Not Estab. %   Eos 2 Not Estab. %   Basos 0 Not Estab. %   Neutrophils Absolute 4.6 1.4 - 7.0 x10E3/uL   Lymphocytes Absolute 1.7 0.7 - 3.1 x10E3/uL   Monocytes Absolute 0.6 0.1 - 0.9 x10E3/uL   EOS (ABSOLUTE) 0.2 0.0 - 0.4 x10E3/uL   Basophils Absolute 0.0 0.0 - 0.2 x10E3/uL   Immature Granulocytes 0  Not Estab. %   Immature Grans (Abs) 0.0 0.0 - 0.1 x10E3/uL  Comprehensive metabolic panel  Result Value Ref Range   Glucose 71 65 - 99 mg/dL   BUN 12 8 - 27 mg/dL   Creatinine, Ser 0.81 0.57 - 1.00 mg/dL   eGFR 73 >59 mL/min/1.73   BUN/Creatinine Ratio 15 12 - 28   Sodium 142 134 - 144 mmol/L   Potassium 4.9 3.5 - 5.2 mmol/L   Chloride 101 96 - 106 mmol/L   CO2 23 20 - 29 mmol/L   Calcium 9.3 8.7 - 10.3 mg/dL   Total Protein 6.8 6.0 - 8.5 g/dL   Albumin 4.3 3.7 - 4.7 g/dL   Globulin, Total 2.5 1.5 - 4.5 g/dL   Albumin/Globulin Ratio 1.7 1.2 - 2.2   Bilirubin Total 0.8 0.0 - 1.2 mg/dL   Alkaline Phosphatase 102 44 - 121 IU/L   AST 17 0 - 40 IU/L   ALT 7 0 - 32 IU/L      Assessment & Plan:   Problem List Items Addressed This Visit       Cardiovascular and Mediastinum   HTN (hypertension)    Running well at home, but high here. Will continue current regimen and continue to monitor. Call with any concerns. Continue to monitor.        Relevant Medications   hydrALAZINE (APRESOLINE) 10 MG tablet   propranolol (INDERAL) 10 MG tablet   rivaroxaban (XARELTO) 20 MG TABS tablet   Other Relevant Orders   CBC with Differential/Platelet (Completed)   Comprehensive metabolic panel (Completed)   Atrial fibrillation ([place]) - Primary    Has not seen cardiology since before the pandemic. Will get her back in. HR under good control today. NSR today. Continue to monitor.  Relevant Medications   hydrALAZINE (APRESOLINE) 10 MG tablet   propranolol (INDERAL) 10 MG tablet   rivaroxaban (XARELTO) 20 MG TABS tablet   Other Relevant Orders   CBC with Differential/Platelet (Completed)   Comprehensive metabolic panel (Completed)   Ambulatory referral to Cardiology   Chronic venous insufficiency    Has not seen vascular surgery since 2019. Will get her back into see them. Call with any concerns.        Relevant Medications   hydrALAZINE (APRESOLINE) 10 MG tablet   propranolol  (INDERAL) 10 MG tablet   rivaroxaban (XARELTO) 20 MG TABS tablet   Other Relevant Orders   Ambulatory referral to Vascular Surgery     Nervous and Auditory   Dementia without behavioral disturbance ([place])    Has help at home. Has home health coming out. Awaiting neurology appointment. Continue to monitor closely. Call with any concerns.        Relevant Medications   Dextromethorphan-quiNIDine (NUEDEXTA) 20-10 MG capsule     Other   History of CVA with residual deficit    Needs to see neurology. Has had some difficulty getting in to see them. Has appointment coming up. Await their input.        Memory loss   Other Visit Diagnoses     Nail problem       Relevant Orders   Ambulatory referral to Podiatry        Follow up plan: Return in about 4 weeks (around 06/21/2021).   >60 minutes spent with patient and her daughter-in-law today

## 2021-05-25 ENCOUNTER — Ambulatory Visit: Payer: Self-pay | Admitting: *Deleted

## 2021-05-25 ENCOUNTER — Telehealth: Payer: Self-pay

## 2021-05-25 ENCOUNTER — Other Ambulatory Visit: Payer: Self-pay | Admitting: Family Medicine

## 2021-05-25 ENCOUNTER — Other Ambulatory Visit: Payer: Medicare HMO

## 2021-05-25 DIAGNOSIS — R310 Gross hematuria: Secondary | ICD-10-CM

## 2021-05-25 LAB — URINALYSIS, ROUTINE W REFLEX MICROSCOPIC
Bilirubin, UA: NEGATIVE
Glucose, UA: NEGATIVE
Leukocytes,UA: NEGATIVE
Nitrite, UA: NEGATIVE
Specific Gravity, UA: 1.025 (ref 1.005–1.030)
Urobilinogen, Ur: 2 mg/dL — ABNORMAL HIGH (ref 0.2–1.0)
pH, UA: 5.5 (ref 5.0–7.5)

## 2021-05-25 LAB — CBC WITH DIFFERENTIAL/PLATELET
Basophils Absolute: 0 10*3/uL (ref 0.0–0.2)
Basos: 0 %
EOS (ABSOLUTE): 0.2 10*3/uL (ref 0.0–0.4)
Eos: 2 %
Hematocrit: 43.6 % (ref 34.0–46.6)
Hemoglobin: 13.9 g/dL (ref 11.1–15.9)
Immature Grans (Abs): 0 10*3/uL (ref 0.0–0.1)
Immature Granulocytes: 0 %
Lymphocytes Absolute: 1.7 10*3/uL (ref 0.7–3.1)
Lymphs: 24 %
MCH: 29.6 pg (ref 26.6–33.0)
MCHC: 31.9 g/dL (ref 31.5–35.7)
MCV: 93 fL (ref 79–97)
Monocytes Absolute: 0.6 10*3/uL (ref 0.1–0.9)
Monocytes: 9 %
Neutrophils Absolute: 4.6 10*3/uL (ref 1.4–7.0)
Neutrophils: 65 %
Platelets: 348 10*3/uL (ref 150–450)
RBC: 4.69 x10E6/uL (ref 3.77–5.28)
RDW: 14.3 % (ref 11.7–15.4)
WBC: 7.2 10*3/uL (ref 3.4–10.8)

## 2021-05-25 LAB — MICROSCOPIC EXAMINATION

## 2021-05-25 LAB — COMPREHENSIVE METABOLIC PANEL
ALT: 7 IU/L (ref 0–32)
AST: 17 IU/L (ref 0–40)
Albumin/Globulin Ratio: 1.7 (ref 1.2–2.2)
Albumin: 4.3 g/dL (ref 3.7–4.7)
Alkaline Phosphatase: 102 IU/L (ref 44–121)
BUN/Creatinine Ratio: 15 (ref 12–28)
BUN: 12 mg/dL (ref 8–27)
Bilirubin Total: 0.8 mg/dL (ref 0.0–1.2)
CO2: 23 mmol/L (ref 20–29)
Calcium: 9.3 mg/dL (ref 8.7–10.3)
Chloride: 101 mmol/L (ref 96–106)
Creatinine, Ser: 0.81 mg/dL (ref 0.57–1.00)
Globulin, Total: 2.5 g/dL (ref 1.5–4.5)
Glucose: 71 mg/dL (ref 65–99)
Potassium: 4.9 mmol/L (ref 3.5–5.2)
Sodium: 142 mmol/L (ref 134–144)
Total Protein: 6.8 g/dL (ref 6.0–8.5)
eGFR: 73 mL/min/{1.73_m2} (ref >59)

## 2021-05-25 MED ORDER — ATORVASTATIN CALCIUM 40 MG PO TABS: 40.0000 mg | ORAL_TABLET | Freq: Every day | ORAL | 1 refills | Status: DC

## 2021-05-25 NOTE — Telephone Encounter (Signed)
[facility] notified.

## 2021-05-25 NOTE — Telephone Encounter (Signed)
Gave message to [name] she is calling steve

## 2021-05-25 NOTE — Telephone Encounter (Signed)
Copied from [place] [phone]. Topic: General - Other >> May 25, 2021  2:06 PM Tessa Lerner A wrote: Reason for CRM: Patient's relative would like to be contacted to discuss the patient's prescription for Dextromethorphan-quiNIDine (NUEDEXTA) 20-10 MG capsule   The patient's relative has concerns related to the cost and effectiveness of the medication   Please contact to further advise when possible

## 2021-05-25 NOTE — Telephone Encounter (Signed)
Patient relative would like to know if it is okay to hold off on Nuedexta due to it being $250 after insurance. States she would like to try without medication and if needed medication will have pharmacy fill. Would like provider advise.

## 2021-05-25 NOTE — Telephone Encounter (Signed)
Order in.

## 2021-05-25 NOTE — Telephone Encounter (Signed)
[name]: [phone] Calling to report change in color of urine-dark and cranberry colored for 24-49 hours. They were just in office yesterday and urine was not check. Patient has no other complaints- she is requesting urine check for blood in urine. Requesting lab order- can she get specimen cup and bring sample in? Will send request and PCP will have to order if office can accommodate without bring patient back to office.  Reason for Disposition  Blood in urine  (Exception: could be normal menstrual bleeding)  Answer Assessment - Initial Assessment Questions 1. SYMPTOM: "What's the main symptom you're concerned about?" (e.g., frequency, incontinence)     Dark colored urine-concerned about possible blood in urine 2. ONSET: "When did the  discoloration  start?"     2 days 3. PAIN: "Is there any pain?" If Yes, ask: "How bad is it?" (Scale: 1-10; mild, moderate, severe)     No pain 4. CAUSE: "What do you think is causing the symptoms?"     Possible blood in urine 5. OTHER SYMPTOMS: "Do you have any other symptoms?" (e.g., fever, flank pain, blood in urine, pain with urination)     Possible blood in urine 6. PREGNANCY: "Is there any chance you are pregnant?" "When was your last menstrual period?"     N/a  Protocols used: Urinary Symptoms-A-AH, Urine - Blood In-A-AH

## 2021-05-25 NOTE — Telephone Encounter (Signed)
Please assist

## 2021-05-25 NOTE — Telephone Encounter (Signed)
Anna Genre at [facility] would like a callback on his cell [phone]

## 2021-05-25 NOTE — Telephone Encounter (Signed)
PA for Diclofenac Sodium 1% Gel initiated via CoverMyMeds [id number] Waiting on response

## 2021-05-25 NOTE — Telephone Encounter (Signed)
Patients daughter in law notified.

## 2021-05-25 NOTE — Addendum Note (Signed)
Addended by: [name] on: 05/25/2021 09:46 AM   Modules accepted: Orders

## 2021-05-25 NOTE — Telephone Encounter (Signed)
OK to stay off of it until she sees neurology (there is also a result note for them if you have them on the phone)

## 2021-05-25 NOTE — Telephone Encounter (Signed)
Please advise 

## 2021-05-28 ENCOUNTER — Encounter: Payer: Self-pay | Admitting: Family Medicine

## 2021-05-28 DIAGNOSIS — R413 Other amnesia: Secondary | ICD-10-CM | POA: Insufficient documentation

## 2021-05-28 DIAGNOSIS — F0390 Unspecified dementia without behavioral disturbance: Secondary | ICD-10-CM | POA: Insufficient documentation

## 2021-05-28 NOTE — Assessment & Plan Note (Signed)
Needs to see neurology. Has had some difficulty getting in to see them. Has appointment coming up. Await their input.

## 2021-05-28 NOTE — Assessment & Plan Note (Signed)
Has not seen cardiology since before the pandemic. Will get her back in. HR under good control today. NSR today. Continue to monitor.

## 2021-05-28 NOTE — Assessment & Plan Note (Signed)
Running well at home, but high here. Will continue current regimen and continue to monitor. Call with any concerns. Continue to monitor.

## 2021-05-28 NOTE — Assessment & Plan Note (Signed)
Has not seen vascular surgery since 2019. Will get her back into see them. Call with any concerns.

## 2021-05-28 NOTE — Assessment & Plan Note (Signed)
Has help at home. Has home health coming out. Awaiting neurology appointment. Continue to monitor closely. Call with any concerns.

## 2021-05-30 DIAGNOSIS — R69 Illness, unspecified: Secondary | ICD-10-CM | POA: Diagnosis not present

## 2021-05-30 DIAGNOSIS — Z7901 Long term (current) use of anticoagulants: Secondary | ICD-10-CM | POA: Diagnosis not present

## 2021-05-30 DIAGNOSIS — Z87891 Personal history of nicotine dependence: Secondary | ICD-10-CM | POA: Diagnosis not present

## 2021-05-30 DIAGNOSIS — E785 Hyperlipidemia, unspecified: Secondary | ICD-10-CM | POA: Diagnosis not present

## 2021-05-30 DIAGNOSIS — I4891 Unspecified atrial fibrillation: Secondary | ICD-10-CM | POA: Diagnosis not present

## 2021-05-30 DIAGNOSIS — I872 Venous insufficiency (chronic) (peripheral): Secondary | ICD-10-CM | POA: Diagnosis not present

## 2021-05-30 DIAGNOSIS — G210 Malignant neuroleptic syndrome: Secondary | ICD-10-CM | POA: Diagnosis not present

## 2021-05-30 DIAGNOSIS — I69318 Other symptoms and signs involving cognitive functions following cerebral infarction: Secondary | ICD-10-CM | POA: Diagnosis not present

## 2021-05-30 DIAGNOSIS — I10 Essential (primary) hypertension: Secondary | ICD-10-CM | POA: Diagnosis not present

## 2021-05-31 ENCOUNTER — Telehealth: Payer: Self-pay | Admitting: Family Medicine

## 2021-05-31 DIAGNOSIS — I69318 Other symptoms and signs involving cognitive functions following cerebral infarction: Secondary | ICD-10-CM | POA: Diagnosis not present

## 2021-05-31 DIAGNOSIS — I872 Venous insufficiency (chronic) (peripheral): Secondary | ICD-10-CM | POA: Diagnosis not present

## 2021-05-31 DIAGNOSIS — I4891 Unspecified atrial fibrillation: Secondary | ICD-10-CM | POA: Diagnosis not present

## 2021-05-31 DIAGNOSIS — E785 Hyperlipidemia, unspecified: Secondary | ICD-10-CM | POA: Diagnosis not present

## 2021-05-31 DIAGNOSIS — Z7901 Long term (current) use of anticoagulants: Secondary | ICD-10-CM | POA: Diagnosis not present

## 2021-05-31 DIAGNOSIS — Z87891 Personal history of nicotine dependence: Secondary | ICD-10-CM | POA: Diagnosis not present

## 2021-05-31 DIAGNOSIS — I10 Essential (primary) hypertension: Secondary | ICD-10-CM | POA: Diagnosis not present

## 2021-05-31 DIAGNOSIS — R69 Illness, unspecified: Secondary | ICD-10-CM | POA: Diagnosis not present

## 2021-05-31 DIAGNOSIS — G210 Malignant neuroleptic syndrome: Secondary | ICD-10-CM | POA: Diagnosis not present

## 2021-05-31 NOTE — Telephone Encounter (Unsigned)
Copied from [place] [phone]. Topic: General - Other >> May 31, 2021 11:47 AM [name] wrote: Reason for CRM: [name] called to speak with nurse about the difference in dosing for hydrALAZINE (APRESOLINE) 10 MG tablet/ but prior to her last visit she was taking '25MG'$  2xs a day/ please advise

## 2021-05-31 NOTE — Telephone Encounter (Signed)
Please advise 

## 2021-06-01 DIAGNOSIS — I10 Essential (primary) hypertension: Secondary | ICD-10-CM | POA: Diagnosis not present

## 2021-06-01 DIAGNOSIS — R69 Illness, unspecified: Secondary | ICD-10-CM | POA: Diagnosis not present

## 2021-06-01 DIAGNOSIS — E785 Hyperlipidemia, unspecified: Secondary | ICD-10-CM | POA: Diagnosis not present

## 2021-06-01 DIAGNOSIS — G210 Malignant neuroleptic syndrome: Secondary | ICD-10-CM | POA: Diagnosis not present

## 2021-06-01 DIAGNOSIS — I69318 Other symptoms and signs involving cognitive functions following cerebral infarction: Secondary | ICD-10-CM | POA: Diagnosis not present

## 2021-06-01 DIAGNOSIS — I872 Venous insufficiency (chronic) (peripheral): Secondary | ICD-10-CM | POA: Diagnosis not present

## 2021-06-01 DIAGNOSIS — Z87891 Personal history of nicotine dependence: Secondary | ICD-10-CM | POA: Diagnosis not present

## 2021-06-01 DIAGNOSIS — Z7901 Long term (current) use of anticoagulants: Secondary | ICD-10-CM | POA: Diagnosis not present

## 2021-06-01 DIAGNOSIS — I4891 Unspecified atrial fibrillation: Secondary | ICD-10-CM | POA: Diagnosis not present

## 2021-06-01 NOTE — Telephone Encounter (Signed)
Patient grand daughter [name] states when patient was at [facility] she was on 25 mg BID of Hydralazine. States she had been on 10 mg BID before then and is okay with her being on 10 mg BID if that's correct dosage.  [name] also states patient has urology appointment on Tuesday 06/05/21, was told to hold off on taking Xarelto until appointment. Grand daughter wants to know if its okay to be off Xarelto until then. Also wants provider to know urine now looks back to normal.

## 2021-06-01 NOTE — Telephone Encounter (Signed)
I refilled what was on the chart- please confirm what dose she was taking and I'll get it over for them

## 2021-06-01 NOTE — Telephone Encounter (Signed)
Patient grand daughter aware. No further questions.

## 2021-06-01 NOTE — Telephone Encounter (Signed)
Yes OK to stay off xarelto since she was bleeding. Let's leave her on the '10mg'$  and we'll see what her BP is next time

## 2021-06-04 ENCOUNTER — Telehealth: Payer: Self-pay

## 2021-06-04 DIAGNOSIS — I872 Venous insufficiency (chronic) (peripheral): Secondary | ICD-10-CM | POA: Diagnosis not present

## 2021-06-04 DIAGNOSIS — I69318 Other symptoms and signs involving cognitive functions following cerebral infarction: Secondary | ICD-10-CM | POA: Diagnosis not present

## 2021-06-04 DIAGNOSIS — R69 Illness, unspecified: Secondary | ICD-10-CM | POA: Diagnosis not present

## 2021-06-04 DIAGNOSIS — E785 Hyperlipidemia, unspecified: Secondary | ICD-10-CM | POA: Diagnosis not present

## 2021-06-04 DIAGNOSIS — I4891 Unspecified atrial fibrillation: Secondary | ICD-10-CM | POA: Diagnosis not present

## 2021-06-04 DIAGNOSIS — Z7901 Long term (current) use of anticoagulants: Secondary | ICD-10-CM | POA: Diagnosis not present

## 2021-06-04 DIAGNOSIS — I10 Essential (primary) hypertension: Secondary | ICD-10-CM | POA: Diagnosis not present

## 2021-06-04 DIAGNOSIS — G210 Malignant neuroleptic syndrome: Secondary | ICD-10-CM | POA: Diagnosis not present

## 2021-06-04 DIAGNOSIS — Z87891 Personal history of nicotine dependence: Secondary | ICD-10-CM | POA: Diagnosis not present

## 2021-06-04 NOTE — Telephone Encounter (Signed)
Copied from [employer] [id number]. Topic: General - Other >> Jun 04, 2021  4:49 PM Yvette Rack wrote: Reason for CRM: [name] Shaker requests that either Dr. Wynetta Emery or her nurse call her back to discuss pt medications.

## 2021-06-05 MED ORDER — HYDRALAZINE HCL 25 MG PO TABS: 25.0000 mg | ORAL_TABLET | Freq: Two times a day (BID) | ORAL | 0 refills | Status: DC

## 2021-06-05 NOTE — Telephone Encounter (Signed)
Returned call to [facility], states she is concerned that patients BP has been running high lately since she has been taking 10 mg BID of hydralazine. She states when patient was at [facility] she was taking 25 mg BID of hydralazine and BP was well controlled. [place] daughter would like to know if dosage can be increased?

## 2021-06-05 NOTE — Telephone Encounter (Signed)
BP has been running [id number] diastolic over [id number] systolic.

## 2021-06-05 NOTE — Telephone Encounter (Signed)
Attempted to call [name], no answer Lvm with provider advise.

## 2021-06-05 NOTE — Telephone Encounter (Signed)
Have her take 2 of the '10mg'$  hydralazine and I'll send in the '25mg'$ 

## 2021-06-05 NOTE — Telephone Encounter (Signed)
What has the BP been running?

## 2021-06-06 ENCOUNTER — Encounter: Payer: Self-pay | Admitting: Podiatry

## 2021-06-06 ENCOUNTER — Other Ambulatory Visit: Payer: Self-pay

## 2021-06-06 ENCOUNTER — Ambulatory Visit: Payer: Medicare HMO | Admitting: Podiatry

## 2021-06-06 ENCOUNTER — Encounter: Payer: Self-pay | Admitting: Urology

## 2021-06-06 ENCOUNTER — Ambulatory Visit: Payer: Medicare HMO | Admitting: Urology

## 2021-06-06 VITALS — BP 156/82 | Ht 65.8 in | Wt 201.7 lb

## 2021-06-06 DIAGNOSIS — M2042 Other hammer toe(s) (acquired), left foot: Secondary | ICD-10-CM

## 2021-06-06 DIAGNOSIS — I872 Venous insufficiency (chronic) (peripheral): Secondary | ICD-10-CM | POA: Diagnosis not present

## 2021-06-06 DIAGNOSIS — I10 Essential (primary) hypertension: Secondary | ICD-10-CM | POA: Diagnosis not present

## 2021-06-06 DIAGNOSIS — Z7901 Long term (current) use of anticoagulants: Secondary | ICD-10-CM | POA: Diagnosis not present

## 2021-06-06 DIAGNOSIS — E785 Hyperlipidemia, unspecified: Secondary | ICD-10-CM | POA: Diagnosis not present

## 2021-06-06 DIAGNOSIS — R69 Illness, unspecified: Secondary | ICD-10-CM | POA: Diagnosis not present

## 2021-06-06 DIAGNOSIS — B351 Tinea unguium: Secondary | ICD-10-CM

## 2021-06-06 DIAGNOSIS — M79675 Pain in left toe(s): Secondary | ICD-10-CM | POA: Diagnosis not present

## 2021-06-06 DIAGNOSIS — R310 Gross hematuria: Secondary | ICD-10-CM | POA: Diagnosis not present

## 2021-06-06 DIAGNOSIS — Z87891 Personal history of nicotine dependence: Secondary | ICD-10-CM | POA: Diagnosis not present

## 2021-06-06 DIAGNOSIS — N390 Urinary tract infection, site not specified: Secondary | ICD-10-CM

## 2021-06-06 DIAGNOSIS — M79674 Pain in right toe(s): Secondary | ICD-10-CM

## 2021-06-06 DIAGNOSIS — G210 Malignant neuroleptic syndrome: Secondary | ICD-10-CM | POA: Diagnosis not present

## 2021-06-06 DIAGNOSIS — M2041 Other hammer toe(s) (acquired), right foot: Secondary | ICD-10-CM | POA: Diagnosis not present

## 2021-06-06 DIAGNOSIS — I69318 Other symptoms and signs involving cognitive functions following cerebral infarction: Secondary | ICD-10-CM | POA: Diagnosis not present

## 2021-06-06 DIAGNOSIS — I4891 Unspecified atrial fibrillation: Secondary | ICD-10-CM | POA: Diagnosis not present

## 2021-06-06 LAB — MICROSCOPIC EXAMINATION

## 2021-06-06 LAB — URINALYSIS, COMPLETE
Bilirubin, UA: NEGATIVE
Glucose, UA: NEGATIVE
Ketones, UA: NEGATIVE
Nitrite, UA: NEGATIVE
Protein,UA: NEGATIVE
RBC, UA: NEGATIVE
Specific Gravity, UA: 1.01 (ref 1.005–1.030)
Urobilinogen, Ur: 0.2 mg/dL (ref 0.2–1.0)
pH, UA: 5.5 (ref 5.0–7.5)

## 2021-06-06 MED ORDER — CIPROFLOXACIN HCL 500 MG PO TABS: 500.0000 mg | ORAL_TABLET | Freq: Two times a day (BID) | ORAL | 0 refills | Status: DC

## 2021-06-06 NOTE — Progress Notes (Signed)
   06/06/21 11:57 AM   [name] 04-Jan-1941 [id number]  CC: Gross hematuria, possible UTI  HPI: I saw [name] and her daughter-in-law today for the above issues.  The history is primarily obtained from her daughter-in-law.  She is a comorbid and frail-appearing 80 year old female recently hospitalized for suspected stroke and started on Xarelto.  At that time she was found of a Pseudomonas UTI and treated with antibiotics.  About 2 weeks ago they noticed a dark red blood in her urine that was painless and resolved spontaneously after 24 hours.  A urinalysis at that time on 05/25/2021 showed 11-30 WBCs, greater than 30 RBCs, few bacteria, nitrite negative, negative leukocytes.  There was not enough urine to send for culture.  She was referred to urology at that point for hematuria.  She denies any dysuria or urgency/frequency, she has some confusion and most of the history is provided by her daughter-in-law.  She is a never smoker aside from trying a few cigarettes as a teenager, and denies any other carcinogenic exposures.  No cross-sectional imaging to review.  Urinalysis today with 11-30 WBCs, 0-2 RBCs, many bacteria, nitrate negative, 1+ leukocytes.  Will send for culture.   PMH: Past Medical History:  Diagnosis Date   Cat bite of right lower leg [id number]   Cellulitis of lower leg 12/07/2014   Hypertension     Family History: Family History  Problem Relation Age of Onset   Hypertension Mother    Alcohol abuse Father    Heart disease Father    Hypertension Father    Hyperlipidemia Sister    Hypertension Sister    Alcohol abuse Brother    Hypertension Brother     Social History:  reports that she quit smoking about 42 years ago. Her smoking use included cigarettes. She has never used smokeless tobacco. She reports that she does not drink alcohol and does not use drugs.  Physical Exam: BP (!) 156/82 (BP Location: Right Arm, Patient Position: Sitting, Cuff Size: Large)   Ht  5' 5.8" (1.671 m)   Wt 201 lb 11.2 oz (91.5 kg)   BMI 32.75 kg/m    Constitutional: Frail-appearing, in wheelchair Cardiovascular: No clubbing, cyanosis, or edema. Respiratory: Normal respiratory effort, no increased work of breathing. GI: Abdomen is soft, nontender, nondistended, no abdominal masses  Laboratory Data: Reviewed, see HPI  Pertinent Imaging: None to review  Assessment & Plan:   80 year old comorbid female with recent hospitalization for possible stroke and UTI who presents with an episode of gross hematuria.  Hematuria resolved after holding Xarelto.  Urinalysis today suspicious for infection, and with no microscopic hematuria.  We discussed common possible etiologies of hematuria including infection, malignancy, urolithiasis, medical renal disease, and idiopathic. Standard workup recommended by the [facility] includes imaging with CT urogram to assess the upper tracts, and cystoscopy. Cytology is performed on patient's with gross hematuria to look for malignant cells in the urine.  I recommended starting by treating her suspected UTI(Cipro twice daily x3 days), and sending urine for culture with close follow-up in 3 to 4 weeks for repeat urinalysis.  If she has persistent gross hematuria despite treatment with antibiotics, or persistent microscopic hematuria on follow-up, would recommend proceeding with CT urogram and cystoscopy  Nickolas Madrid, MD 06/06/2021  [facility] [address] [place], [place] [address] [phone]

## 2021-06-07 DIAGNOSIS — I69318 Other symptoms and signs involving cognitive functions following cerebral infarction: Secondary | ICD-10-CM | POA: Diagnosis not present

## 2021-06-07 DIAGNOSIS — I4891 Unspecified atrial fibrillation: Secondary | ICD-10-CM | POA: Diagnosis not present

## 2021-06-07 DIAGNOSIS — I872 Venous insufficiency (chronic) (peripheral): Secondary | ICD-10-CM | POA: Diagnosis not present

## 2021-06-07 DIAGNOSIS — G210 Malignant neuroleptic syndrome: Secondary | ICD-10-CM | POA: Diagnosis not present

## 2021-06-07 DIAGNOSIS — E785 Hyperlipidemia, unspecified: Secondary | ICD-10-CM | POA: Diagnosis not present

## 2021-06-07 DIAGNOSIS — Z87891 Personal history of nicotine dependence: Secondary | ICD-10-CM | POA: Diagnosis not present

## 2021-06-07 DIAGNOSIS — R69 Illness, unspecified: Secondary | ICD-10-CM | POA: Diagnosis not present

## 2021-06-07 DIAGNOSIS — Z7901 Long term (current) use of anticoagulants: Secondary | ICD-10-CM | POA: Diagnosis not present

## 2021-06-07 DIAGNOSIS — I10 Essential (primary) hypertension: Secondary | ICD-10-CM | POA: Diagnosis not present

## 2021-06-08 ENCOUNTER — Telehealth: Payer: Self-pay | Admitting: Family Medicine

## 2021-06-08 NOTE — Telephone Encounter (Signed)
Copied from [place] [id number]. Topic: Quick Communication - Home Health Verbal Orders >> Jun 08, 2021  3:16 PM Yvette Rack wrote: Caller/Agency: Marlowe Kays with [place] Number: [phone] Requesting OT/PT/Skilled Nursing/Social Work/Speech Therapy: OT Frequency: 1 time a week for 5 weeks

## 2021-06-08 NOTE — Telephone Encounter (Signed)
Verbal ok?

## 2021-06-09 ENCOUNTER — Other Ambulatory Visit (HOSPITAL_COMMUNITY): Payer: Self-pay | Admitting: Interventional Radiology

## 2021-06-09 ENCOUNTER — Inpatient Hospital Stay (HOSPITAL_COMMUNITY)
Admission: EM | Admit: 2021-06-09 | Discharge: 2021-06-09 | Disposition: A | Discharge status: Home / Self Care | Payer: Medicare HMO | Source: Home / Self Care | Attending: Interventional Radiology | Admitting: Interventional Radiology

## 2021-06-09 ENCOUNTER — Encounter (HOSPITAL_COMMUNITY)
Admission: EM | Disposition: A | Discharge status: Skilled Nursing Facility | Payer: Self-pay | Source: Other Acute Inpatient Hospital | Attending: Internal Medicine

## 2021-06-09 ENCOUNTER — Encounter (HOSPITAL_COMMUNITY): Payer: Self-pay | Admitting: Certified Registered"

## 2021-06-09 ENCOUNTER — Emergency Department: Payer: Medicare HMO

## 2021-06-09 ENCOUNTER — Other Ambulatory Visit: Payer: Self-pay

## 2021-06-09 ENCOUNTER — Inpatient Hospital Stay (HOSPITAL_COMMUNITY)
Admission: EM | Admit: 2021-06-09 | Discharge: 2021-07-04 | DRG: 023 | Disposition: A | Discharge status: Skilled Nursing Facility | Payer: Medicare HMO | Source: Other Acute Inpatient Hospital | Attending: Internal Medicine | Admitting: Internal Medicine

## 2021-06-09 ENCOUNTER — Inpatient Hospital Stay (HOSPITAL_COMMUNITY): Payer: Medicare HMO | Admitting: Certified Registered"

## 2021-06-09 ENCOUNTER — Inpatient Hospital Stay (HOSPITAL_COMMUNITY): Payer: Medicare HMO

## 2021-06-09 ENCOUNTER — Encounter: Payer: Self-pay | Admitting: Emergency Medicine

## 2021-06-09 ENCOUNTER — Emergency Department
Admission: EM | Admit: 2021-06-09 | Discharge: 2021-06-09 | Disposition: A | Discharge status: Other Acute Inpatient Hospital | Payer: Medicare HMO | Attending: Emergency Medicine | Admitting: Emergency Medicine

## 2021-06-09 DIAGNOSIS — R4701 Aphasia: Secondary | ICD-10-CM | POA: Diagnosis present

## 2021-06-09 DIAGNOSIS — Z7901 Long term (current) use of anticoagulants: Secondary | ICD-10-CM

## 2021-06-09 DIAGNOSIS — I6602 Occlusion and stenosis of left middle cerebral artery: Secondary | ICD-10-CM | POA: Diagnosis present

## 2021-06-09 DIAGNOSIS — I10 Essential (primary) hypertension: Secondary | ICD-10-CM | POA: Insufficient documentation

## 2021-06-09 DIAGNOSIS — I639 Cerebral infarction, unspecified: Secondary | ICD-10-CM

## 2021-06-09 DIAGNOSIS — Z79899 Other long term (current) drug therapy: Secondary | ICD-10-CM

## 2021-06-09 DIAGNOSIS — Z888 Allergy status to other drugs, medicaments and biological substances status: Secondary | ICD-10-CM

## 2021-06-09 DIAGNOSIS — Z87891 Personal history of nicotine dependence: Secondary | ICD-10-CM

## 2021-06-09 DIAGNOSIS — I482 Chronic atrial fibrillation, unspecified: Secondary | ICD-10-CM | POA: Diagnosis not present

## 2021-06-09 DIAGNOSIS — I724 Aneurysm of artery of lower extremity: Secondary | ICD-10-CM | POA: Diagnosis not present

## 2021-06-09 DIAGNOSIS — R29707 NIHSS score 7: Secondary | ICD-10-CM | POA: Diagnosis present

## 2021-06-09 DIAGNOSIS — Z781 Physical restraint status: Secondary | ICD-10-CM | POA: Diagnosis not present

## 2021-06-09 DIAGNOSIS — R710 Precipitous drop in hematocrit: Secondary | ICD-10-CM | POA: Diagnosis not present

## 2021-06-09 DIAGNOSIS — Z4682 Encounter for fitting and adjustment of non-vascular catheter: Secondary | ICD-10-CM | POA: Diagnosis not present

## 2021-06-09 DIAGNOSIS — R262 Difficulty in walking, not elsewhere classified: Secondary | ICD-10-CM | POA: Diagnosis not present

## 2021-06-09 DIAGNOSIS — Z9289 Personal history of other medical treatment: Secondary | ICD-10-CM

## 2021-06-09 DIAGNOSIS — R69 Illness, unspecified: Secondary | ICD-10-CM | POA: Diagnosis not present

## 2021-06-09 DIAGNOSIS — I63312 Cerebral infarction due to thrombosis of left middle cerebral artery: Secondary | ICD-10-CM | POA: Diagnosis not present

## 2021-06-09 DIAGNOSIS — Z8249 Family history of ischemic heart disease and other diseases of the circulatory system: Secondary | ICD-10-CM | POA: Diagnosis not present

## 2021-06-09 DIAGNOSIS — R4182 Altered mental status, unspecified: Secondary | ICD-10-CM | POA: Diagnosis not present

## 2021-06-09 DIAGNOSIS — Z9889 Other specified postprocedural states: Secondary | ICD-10-CM | POA: Diagnosis not present

## 2021-06-09 DIAGNOSIS — R339 Retention of urine, unspecified: Secondary | ICD-10-CM | POA: Diagnosis not present

## 2021-06-09 DIAGNOSIS — E876 Hypokalemia: Secondary | ICD-10-CM | POA: Diagnosis not present

## 2021-06-09 DIAGNOSIS — I480 Paroxysmal atrial fibrillation: Secondary | ICD-10-CM | POA: Diagnosis present

## 2021-06-09 DIAGNOSIS — F05 Delirium due to known physiological condition: Secondary | ICD-10-CM | POA: Diagnosis present

## 2021-06-09 DIAGNOSIS — Z20822 Contact with and (suspected) exposure to covid-19: Secondary | ICD-10-CM | POA: Diagnosis present

## 2021-06-09 DIAGNOSIS — R531 Weakness: Secondary | ICD-10-CM | POA: Diagnosis not present

## 2021-06-09 DIAGNOSIS — I63412 Cerebral infarction due to embolism of left middle cerebral artery: Principal | ICD-10-CM | POA: Diagnosis present

## 2021-06-09 DIAGNOSIS — I6340 Cerebral infarction due to embolism of unspecified cerebral artery: Secondary | ICD-10-CM

## 2021-06-09 DIAGNOSIS — F03911 Unspecified dementia, unspecified severity, with agitation: Secondary | ICD-10-CM

## 2021-06-09 DIAGNOSIS — R509 Fever, unspecified: Secondary | ICD-10-CM | POA: Diagnosis not present

## 2021-06-09 DIAGNOSIS — F0391 Unspecified dementia with behavioral disturbance: Secondary | ICD-10-CM | POA: Diagnosis not present

## 2021-06-09 DIAGNOSIS — R410 Disorientation, unspecified: Secondary | ICD-10-CM | POA: Diagnosis not present

## 2021-06-09 DIAGNOSIS — F0390 Unspecified dementia without behavioral disturbance: Secondary | ICD-10-CM | POA: Insufficient documentation

## 2021-06-09 DIAGNOSIS — E785 Hyperlipidemia, unspecified: Secondary | ICD-10-CM | POA: Diagnosis present

## 2021-06-09 DIAGNOSIS — Z6833 Body mass index (BMI) 33.0-33.9, adult: Secondary | ICD-10-CM

## 2021-06-09 DIAGNOSIS — M6281 Muscle weakness (generalized): Secondary | ICD-10-CM | POA: Diagnosis not present

## 2021-06-09 DIAGNOSIS — G210 Malignant neuroleptic syndrome: Secondary | ICD-10-CM | POA: Diagnosis not present

## 2021-06-09 DIAGNOSIS — Z743 Need for continuous supervision: Secondary | ICD-10-CM | POA: Diagnosis not present

## 2021-06-09 DIAGNOSIS — I69351 Hemiplegia and hemiparesis following cerebral infarction affecting right dominant side: Secondary | ICD-10-CM

## 2021-06-09 DIAGNOSIS — E669 Obesity, unspecified: Secondary | ICD-10-CM | POA: Diagnosis present

## 2021-06-09 DIAGNOSIS — I63512 Cerebral infarction due to unspecified occlusion or stenosis of left middle cerebral artery: Secondary | ICD-10-CM | POA: Diagnosis not present

## 2021-06-09 DIAGNOSIS — Z006 Encounter for examination for normal comparison and control in clinical research program: Secondary | ICD-10-CM

## 2021-06-09 DIAGNOSIS — I517 Cardiomegaly: Secondary | ICD-10-CM | POA: Diagnosis not present

## 2021-06-09 DIAGNOSIS — I6523 Occlusion and stenosis of bilateral carotid arteries: Secondary | ICD-10-CM | POA: Diagnosis not present

## 2021-06-09 DIAGNOSIS — Z9114 Patient's other noncompliance with medication regimen: Secondary | ICD-10-CM

## 2021-06-09 DIAGNOSIS — Z83438 Family history of other disorder of lipoprotein metabolism and other lipidemia: Secondary | ICD-10-CM | POA: Diagnosis not present

## 2021-06-09 DIAGNOSIS — J9589 Other postprocedural complications and disorders of respiratory system, not elsewhere classified: Secondary | ICD-10-CM | POA: Diagnosis not present

## 2021-06-09 DIAGNOSIS — I872 Venous insufficiency (chronic) (peripheral): Secondary | ICD-10-CM | POA: Diagnosis not present

## 2021-06-09 DIAGNOSIS — R0689 Other abnormalities of breathing: Secondary | ICD-10-CM | POA: Diagnosis not present

## 2021-06-09 DIAGNOSIS — G459 Transient cerebral ischemic attack, unspecified: Secondary | ICD-10-CM | POA: Diagnosis not present

## 2021-06-09 DIAGNOSIS — R404 Transient alteration of awareness: Secondary | ICD-10-CM | POA: Diagnosis not present

## 2021-06-09 DIAGNOSIS — I63322 Cerebral infarction due to thrombosis of left anterior cerebral artery: Secondary | ICD-10-CM | POA: Diagnosis not present

## 2021-06-09 DIAGNOSIS — I6782 Cerebral ischemia: Secondary | ICD-10-CM | POA: Diagnosis not present

## 2021-06-09 DIAGNOSIS — Z8673 Personal history of transient ischemic attack (TIA), and cerebral infarction without residual deficits: Secondary | ICD-10-CM | POA: Diagnosis not present

## 2021-06-09 DIAGNOSIS — R001 Bradycardia, unspecified: Secondary | ICD-10-CM | POA: Diagnosis not present

## 2021-06-09 DIAGNOSIS — I69398 Other sequelae of cerebral infarction: Secondary | ICD-10-CM | POA: Diagnosis not present

## 2021-06-09 DIAGNOSIS — F0280 Dementia in other diseases classified elsewhere without behavioral disturbance: Secondary | ICD-10-CM | POA: Diagnosis not present

## 2021-06-09 DIAGNOSIS — M1990 Unspecified osteoarthritis, unspecified site: Secondary | ICD-10-CM | POA: Diagnosis not present

## 2021-06-09 DIAGNOSIS — R29818 Other symptoms and signs involving the nervous system: Secondary | ICD-10-CM | POA: Diagnosis not present

## 2021-06-09 HISTORY — PX: RADIOLOGY WITH ANESTHESIA: SHX6223

## 2021-06-09 HISTORY — PX: IR CT HEAD LTD: IMG2386

## 2021-06-09 HISTORY — PX: IR PERCUTANEOUS ART THROMBECTOMY/INFUSION INTRACRANIAL INC DIAG ANGIO: IMG6087

## 2021-06-09 LAB — POCT I-STAT 7, (LYTES, BLD GAS, ICA,H+H)
Acid-Base Excess: 0 mmol/L (ref 0.0–2.0)
Bicarbonate: 25.7 mmol/L (ref 20.0–28.0)
Calcium, Ion: 1.26 mmol/L (ref 1.15–1.40)
HCT: 40 % (ref 36.0–46.0)
Hemoglobin: 13.6 g/dL (ref 12.0–15.0)
O2 Saturation: 99 %
Potassium: 3.6 mmol/L (ref 3.5–5.1)
Sodium: 141 mmol/L (ref 135–145)
TCO2: 27 mmol/L (ref 22–32)
pCO2 arterial: 46.2 mmHg (ref 32.0–48.0)
pH, Arterial: 7.353 (ref 7.350–7.450)
pO2, Arterial: 129 mmHg — ABNORMAL HIGH (ref 83.0–108.0)

## 2021-06-09 LAB — COMPREHENSIVE METABOLIC PANEL
ALT: 11 U/L (ref 0–44)
AST: 20 U/L (ref 15–41)
Albumin: 3.8 g/dL (ref 3.5–5.0)
Alkaline Phosphatase: 76 U/L (ref 38–126)
Anion gap: 7 (ref 5–15)
BUN: 12 mg/dL (ref 8–23)
CO2: 28 mmol/L (ref 22–32)
Calcium: 9.3 mg/dL (ref 8.9–10.3)
Chloride: 107 mmol/L (ref 98–111)
Creatinine, Ser: 0.88 mg/dL (ref 0.44–1.00)
GFR, Estimated: >60 mL/min (ref >60)
Glucose, Bld: 94 mg/dL (ref 70–99)
Potassium: 4 mmol/L (ref 3.5–5.1)
Sodium: 142 mmol/L (ref 135–145)
Total Bilirubin: 1.1 mg/dL (ref 0.3–1.2)
Total Protein: 7.3 g/dL (ref 6.5–8.1)

## 2021-06-09 LAB — PROTIME-INR
INR: 1 (ref 0.8–1.2)
Prothrombin Time: 13.6 seconds (ref 11.4–15.2)

## 2021-06-09 LAB — CBC
HCT: 40.8 % (ref 36.0–46.0)
HCT: 45.8 % (ref 36.0–46.0)
Hemoglobin: 13.8 g/dL (ref 12.0–15.0)
Hemoglobin: 15.2 g/dL — ABNORMAL HIGH (ref 12.0–15.0)
MCH: 30.6 pg (ref 26.0–34.0)
MCH: 29.9 pg (ref 26.0–34.0)
MCHC: 33.8 g/dL (ref 30.0–36.0)
MCHC: 33.2 g/dL (ref 30.0–36.0)
MCV: 90.5 fL (ref 80.0–100.0)
MCV: 90.2 fL (ref 80.0–100.0)
Platelets: 317 10*3/uL (ref 150–400)
Platelets: 293 10*3/uL (ref 150–400)
RBC: 4.51 MIL/uL (ref 3.87–5.11)
RBC: 5.08 MIL/uL (ref 3.87–5.11)
RDW: 14.2 % (ref 11.5–15.5)
RDW: 14.2 % (ref 11.5–15.5)
WBC: 5.4 10*3/uL (ref 4.0–10.5)
WBC: 7.4 10*3/uL (ref 4.0–10.5)
nRBC: 0 % (ref 0.0–0.2)
nRBC: 0 % (ref 0.0–0.2)

## 2021-06-09 LAB — DIFFERENTIAL
Abs Immature Granulocytes: 0.01 10*3/uL (ref 0.00–0.07)
Basophils Absolute: 0.1 10*3/uL (ref 0.0–0.1)
Basophils Relative: 1 %
Eosinophils Absolute: 0.2 10*3/uL (ref 0.0–0.5)
Eosinophils Relative: 4 %
Immature Granulocytes: 0 %
Lymphocytes Relative: 29 %
Lymphs Abs: 1.6 10*3/uL (ref 0.7–4.0)
Monocytes Absolute: 0.4 10*3/uL (ref 0.1–1.0)
Monocytes Relative: 7 %
Neutro Abs: 3.2 10*3/uL (ref 1.7–7.7)
Neutrophils Relative %: 59 %

## 2021-06-09 LAB — APTT: aPTT: 30 seconds (ref 24–36)

## 2021-06-09 LAB — RESP PANEL BY RT-PCR (FLU A&B, COVID) ARPGX2
Influenza A by PCR: NEGATIVE
Influenza B by PCR: NEGATIVE
SARS Coronavirus 2 by RT PCR: NEGATIVE

## 2021-06-09 LAB — MRSA NEXT GEN BY PCR, NASAL: MRSA by PCR Next Gen: NOT DETECTED

## 2021-06-09 LAB — CULTURE, URINE COMPREHENSIVE

## 2021-06-09 LAB — CREATININE, SERUM
Creatinine, Ser: 0.74 mg/dL (ref 0.44–1.00)
GFR, Estimated: >60 mL/min (ref >60)

## 2021-06-09 LAB — CBG MONITORING, ED: Glucose-Capillary: 82 mg/dL (ref 70–99)

## 2021-06-09 IMAGING — XA IR PERCUTANEOUS ART THORMBECTOMY/INFUSION INTRACRANIAL INCLUDE D
10 of 13 series · 12 of 24 positions shown · IV contrast (IODINE)
Comparison: CT angiogram of head and neck [DATE].

INDICATION: New onset of global aphasia.

EXAM:
1. EMERGENT LARGE VESSEL OCCLUSION THROMBOLYSIS (anterior
CIRCULATION)
TECHNIQUE: Following a full explanation of the procedure along with the
potential associated complications, an informed witnessed consent
was obtained. The risks of intracranial hemorrhage of 10%, worsening
neurological deficit, ventilator dependency, death and inability to
revascularize were all reviewed in detail with the patient's
daughter.

[Series 1: cerebral · 2 acquisitions, 1 frame shown (1 of 9)]
[im 1/2]
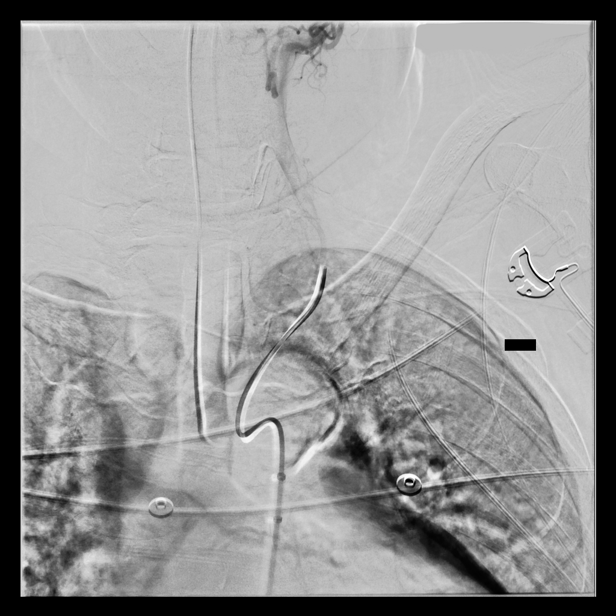

[Series 3: cerebral · 2 acquisitions, 1 frame shown (2 of 9)]
[im 1/2]
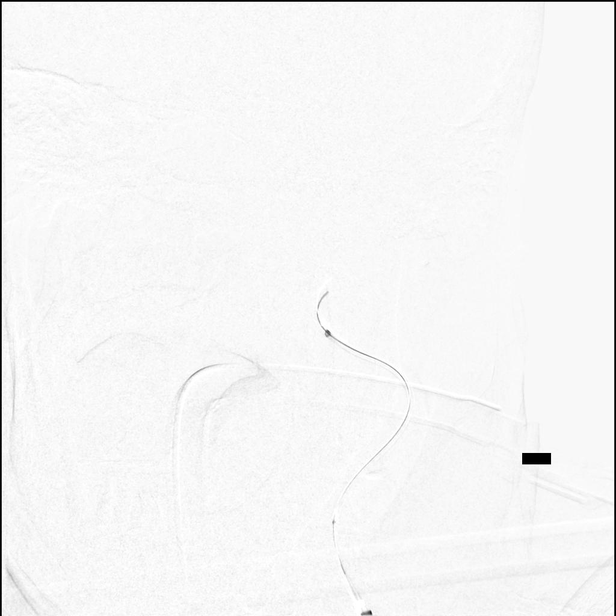

[Series 4: cerebral · 2 acquisitions, 1 frame shown (3 of 9)]
[im 1/2]
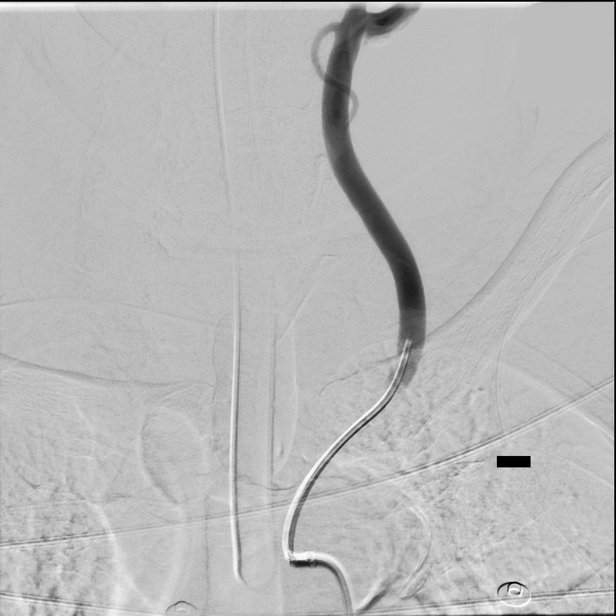

[Series 5: cerebral · 2 acquisitions, 1 frame shown (4 of 9)]
[im 1/2]
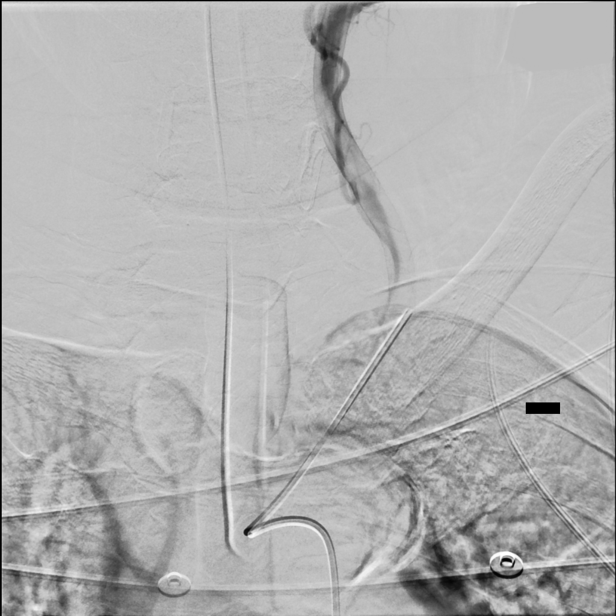

[Series 7: cerebral · 2 acquisitions, 1 frame shown (5 of 9)]
[im 1/2]
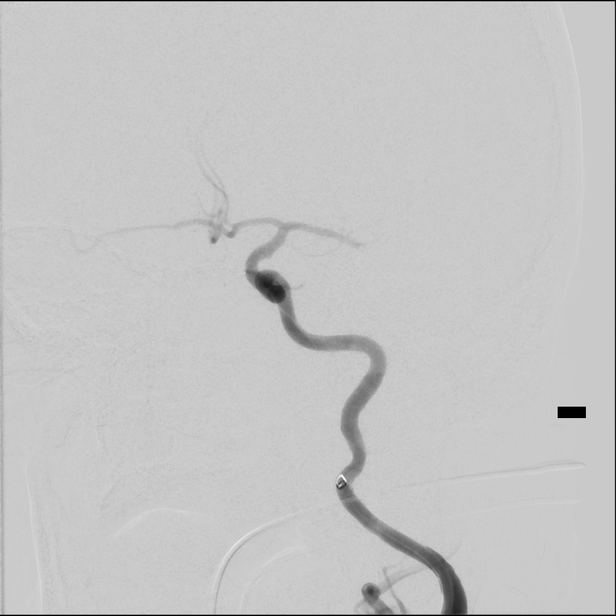

[Series 8: cerebral · 2 acquisitions, 1 frame shown (6 of 9)]
[im 1/2]
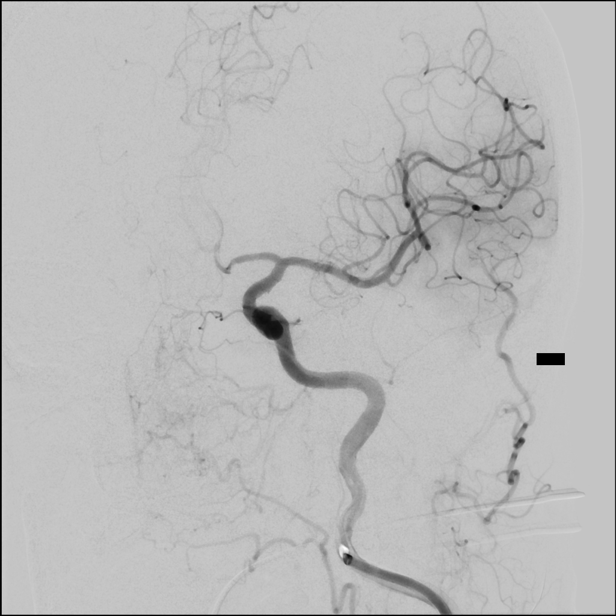

[Series 9: cerebral · 2 acquisitions, 1 frame shown (7 of 9)]
[im 1/2]
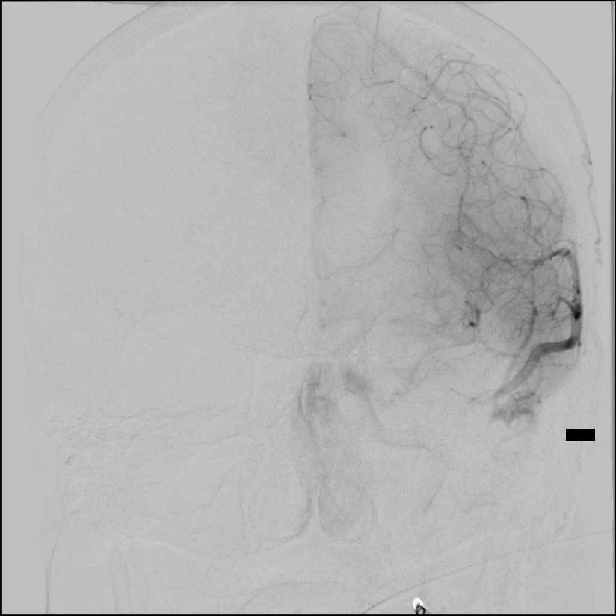

[Series 11: cerebral · 2 acquisitions, 1 frame shown (8 of 9)]
[im 1/2]
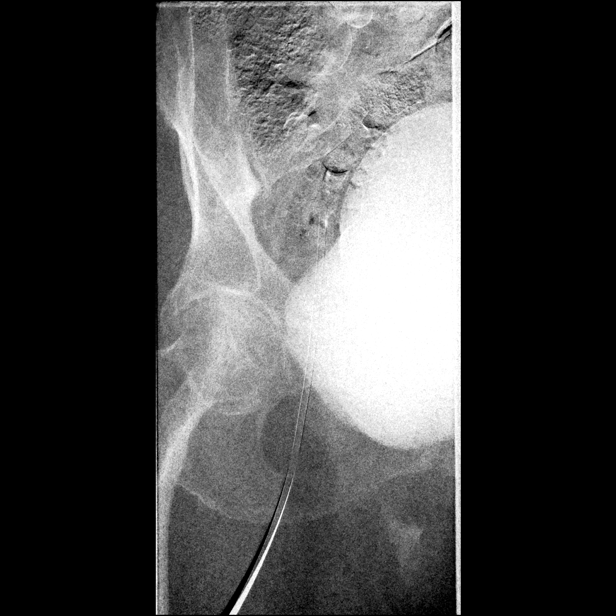

[Series 12: cerebral · 2 acquisitions, 1 frame shown (9 of 9)]
[im 1/2]
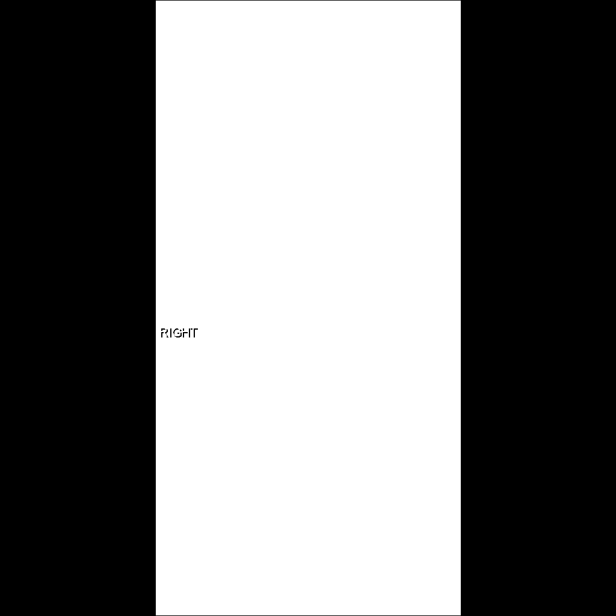

[Series 300: dr. (person_name) · 3 of 141 slices shown]
[im 1/141]
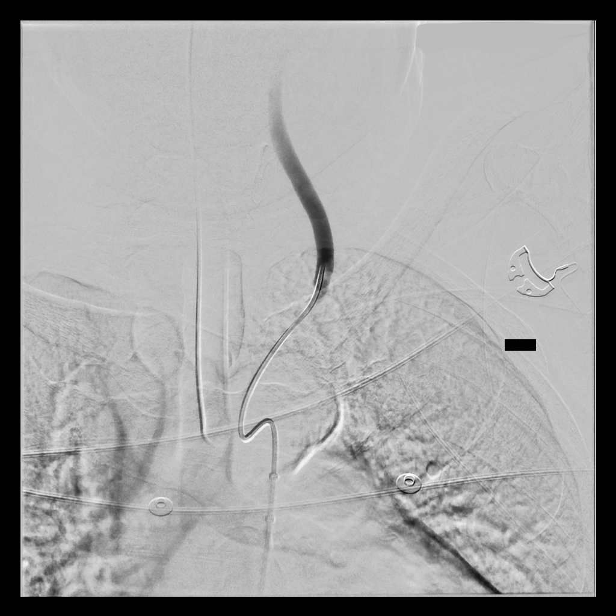
[im 71/141]
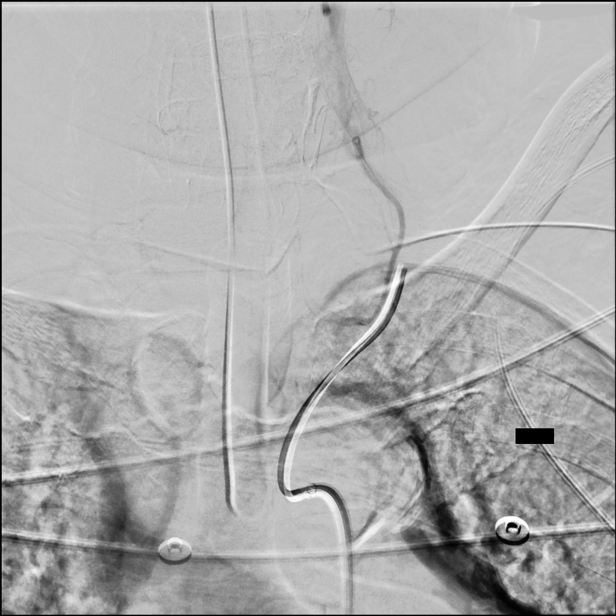
[im 141/141]
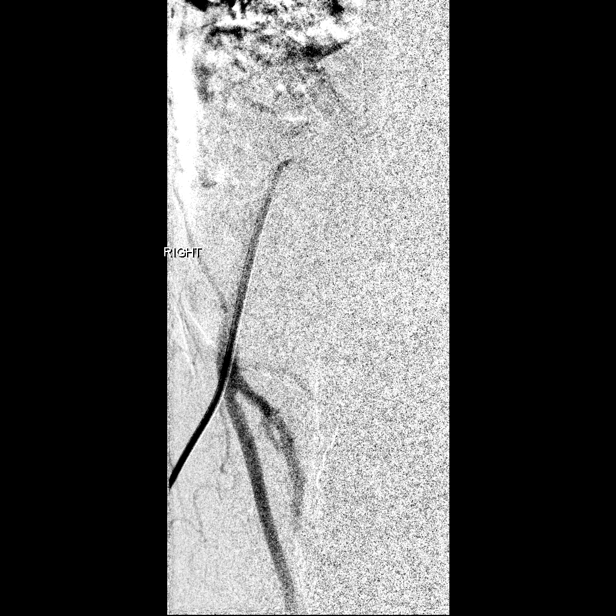

[12 of 24 positions shown; findings below may reference images not displayed]

MEDICATIONS:
Ancef 2 g IV antibiotic was administered within 1 hour of the
procedure.

ANESTHESIA/SEDATION:
General anesthesia.

CONTRAST:  Omnipaque 300 approximately 100 mL.

FLUOROSCOPY TIME:  Fluoroscopy Time: 64 minutes 50 seconds ([1K]
mGy).

COMPLICATIONS:
None immediate.
The patient was then put under general anesthesia by the [REDACTED] at [HOSPITAL].

The right groin was prepped and draped in the usual sterile fashion.
Thereafter using modified Seldinger technique, transfemoral access
into the right common femoral artery was obtained without
difficulty. Over a 0.035 inch guidewire an 8 French Pinnacle 25 cm
sheath was inserted. Through this, and also over a 0.035 inch
guidewire a 5.5 BYE 2 support catheter inside of an 088
TracStar guide catheter combination was advanced to the aortic arch
region. BYE 2 support catheter was then advanced over the
guidewire to the mid cervical left ICA by the TracStar guide
catheter. The support catheter and the wire were removed. Good
aspiration was obtained from the hub of the TracStar guide catheter.
FINDINGS: A gentle control arteriogram performed through this demonstrated no
evidence of dissections or spasm, or of intraluminal filling
defects. More distally, the left internal carotid artery in the
petrous, cavernous and supraclinoid segments demonstrate wide
patency.

The left anterior cerebral artery opacifies into the capillary and
venous phases.

The left middle cerebral artery M1 segment demonstrates wide
patency.

Complete angiographic occlusion was seen of the dominant inferior
division in the mid M2 segment. The superior division remained
widely patent into the capillary and venous phases.

PROCEDURE:
Over a 0.014 inch standard Synchro micro guidewire with a J-tip
configuration, a 162 cm 021 Trevo trak inside of a 55 132 cm Zoom
aspiration catheter combination was advanced to the supraclinoid
left ICA.

The micro guidewire was then gently advanced through the occluded
inferior division into the M2 M3 region followed by the
microcatheter. The guidewire was removed. Good aspiration obtained
from the hub of the microcatheter. A gentle control arteriogram
performed through the microcatheter demonstrated good antegrade
flow. This was then connected to continuous heparinized saline
infusion.

A 4 mm x 40 mm Solitaire X retrieval device was then advanced to the
distal end of the microcatheter. The O ring on the delivery
microcatheter was loosened. With slight forward gentle traction with
the right hand on the delivery micro guidewire with the left hand
the microcatheter was retrieved deploying the retrieval device
proximal and just proximal to the occluded inferior division. At
this time the 055 Zoom aspiration catheter was advanced and engaged
in the proximal inferior division.

Thereafter, constant aspiration was applied with a 20 mL syringe at
the hub of an 088 Zoom TrakStar catheter, and with Penumbra
aspiration device at the hub of the Zoom aspiration catheter for
approximately 2-1/2 minutes.

Thereafter, the combination of the microcatheter retrieval device,
and the Zoom aspiration catheter were retrieved and removed. A
control arteriogram performed through 088 guide catheter in the left
internal carotid artery demonstrated revascularization of the
previously occluded inferior division with patency of the superior
division maintained. A TICI 3 revascularization was obtained.

The left anterior cerebral artery circulation demonstrated wide
patency.

The Zoom 088 catheter was retrieved into the common carotid artery.

A control arteriogram performed through this demonstrated patency of
the left external carotid artery and its major branches. The left
internal carotid artery at the bulb and extracranially appeared
widely patent without evidence of intraluminal filling defects or of
occlusions or dissections.

The 088 Zoom catheter was removed. Hemostasis at the right groin
puncture site was sealed with a 6 French Angio-Seal closure device.
Distal pulses remained Dopplerable in both feet unchanged. CT of the
brain demonstrated no evidence of intracranial hemorrhage or mass
effect or midline shift.

Patient was left intubated awaiting the COVID test.

Patient was transferred to neuro ICU for post revascularization
care.
IMPRESSION: Status post endovascular complete revascularization of occluded
dominant inferior division of the left middle cerebral artery with 1
pass with a 4 mm x 40 mm Solitaire X retrieval device, and contact
aspiration achieving a TICI 3 revascularization.

PLAN:
Follow up as per referring BYE.

## 2021-06-09 IMAGING — CT CT HEAD CODE STROKE
4 series · 17 of 47 positions shown, 19 images · non-contrast
Comparison: [DATE] brain MRI

CLINICAL DATA: Code stroke. Right-sided weakness and slurred speech

EXAM:
CT HEAD WITHOUT CONTRAST
TECHNIQUE: Contiguous axial images were obtained from the base of the skull
through the vertex without intravenous contrast.

[Series 3: head wo · axial · 0.44mm/px · z∈[-91,+29]mm · 7 of 34 slices shown, 9 images]
[im 5/34  brain]
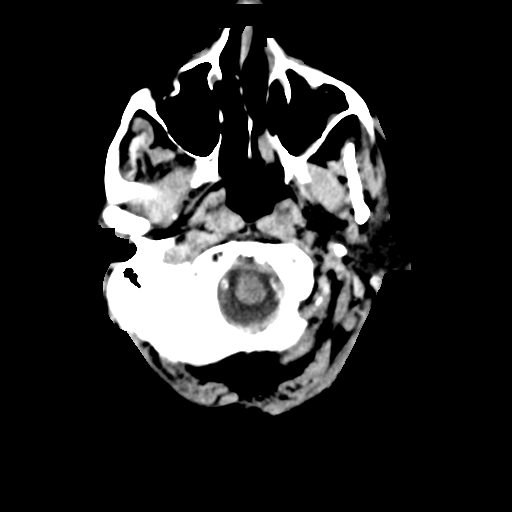
[im 5/34  bone]
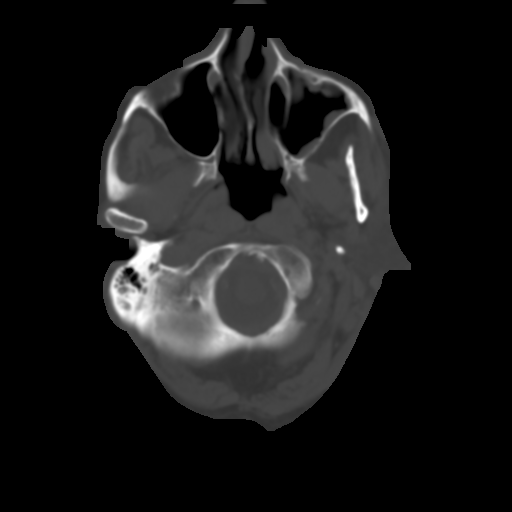
[im 9/34  brain]
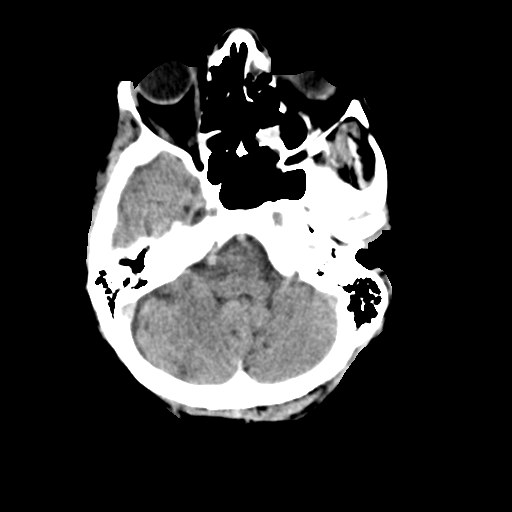
[im 13/34  brain]
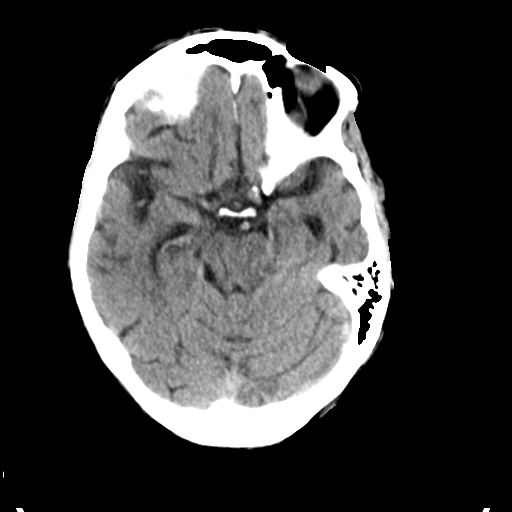
[im 17/34  brain]
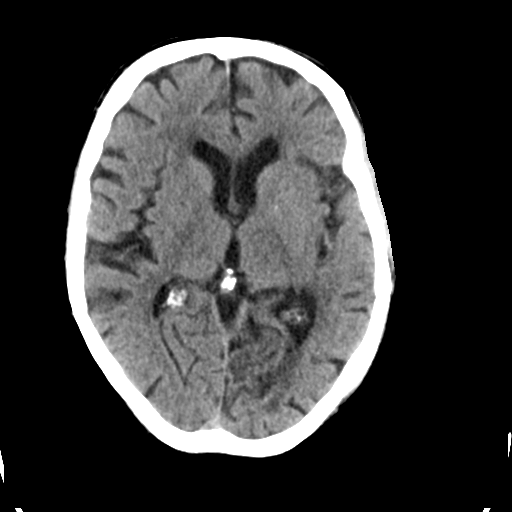
[im 21/34  brain]
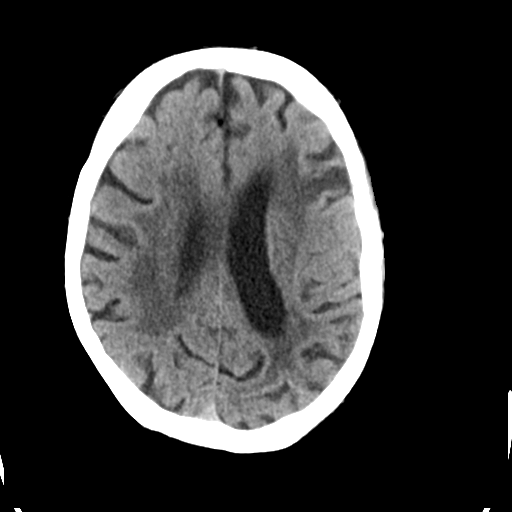
[im 21/34  bone]
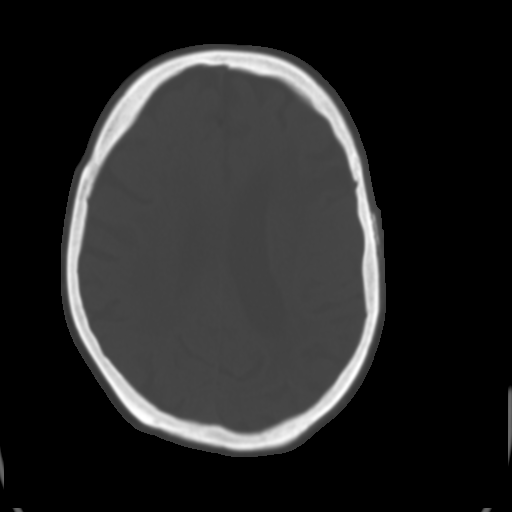
[im 25/34  brain]
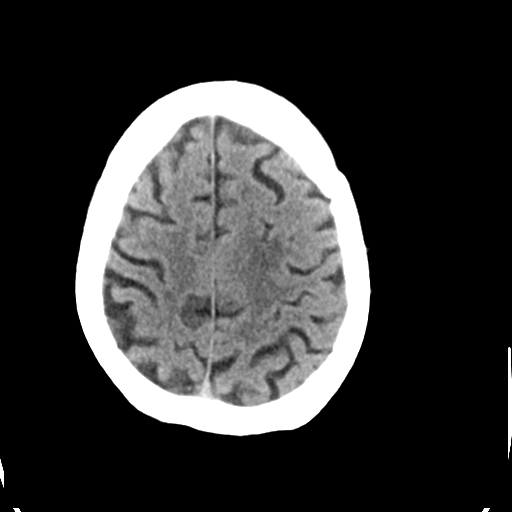
[im 29/34  brain]
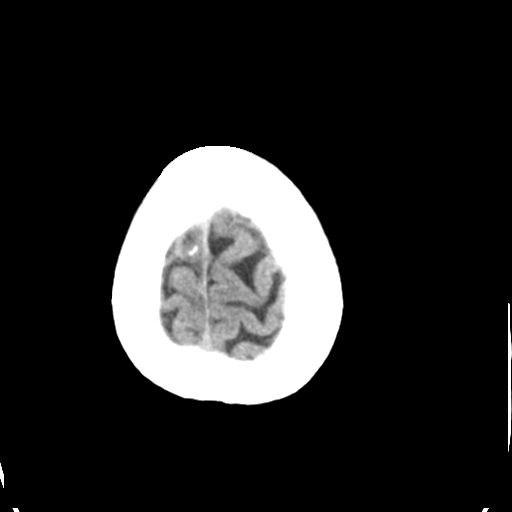

[Series 4: head bone · axial · 0.44mm/px · z∈[-95,-37]mm · 4 of 85 slices shown]
[im 9/85  bone]
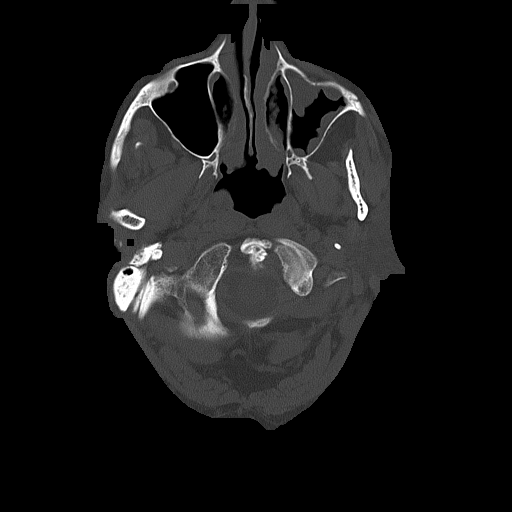
[im 17/85  bone]
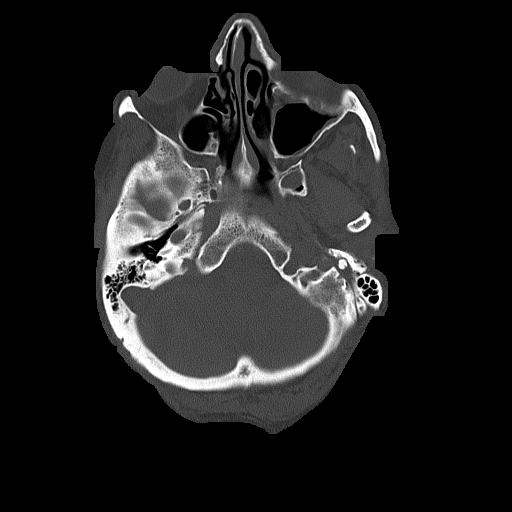
[im 26/85  bone]
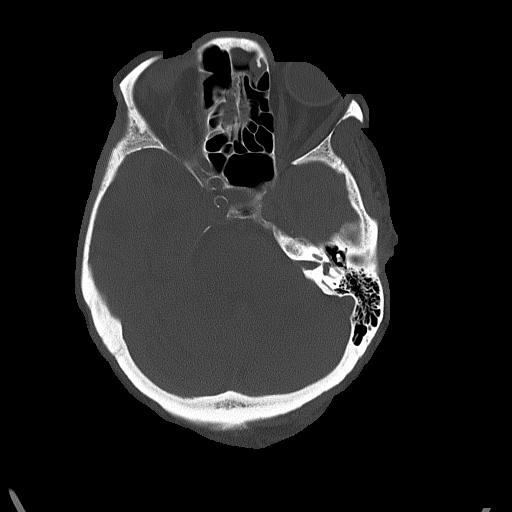
[im 38/85  bone]
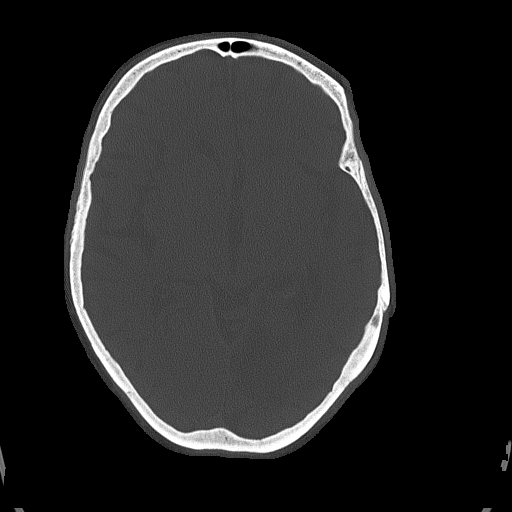

[Series 5: coronal soft tissue · coronal · 0.33mm/px · 3 of 72 slices shown]
[im 24/72  brain]
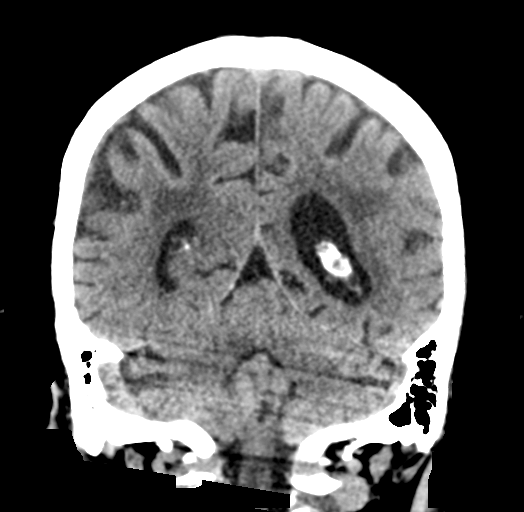
[im 32/72  brain]
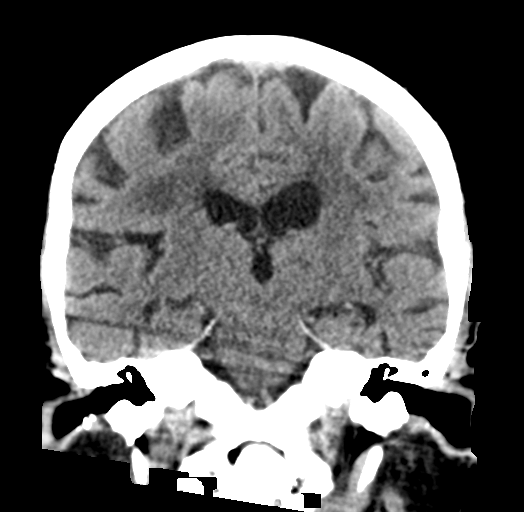
[im 40/72  brain]
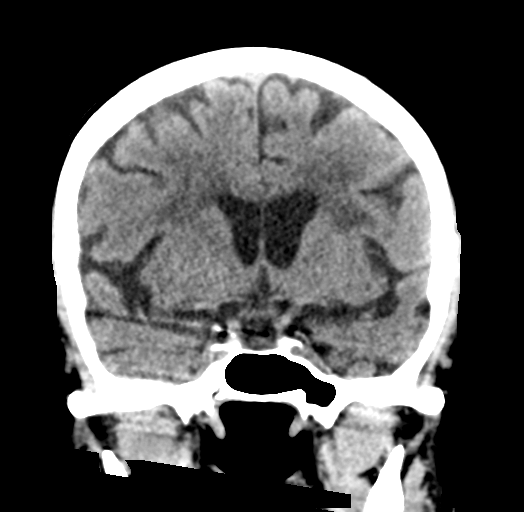

[Series 6: sagittal soft tissue · sagittal · 0.33mm/px · 3 of 58 slices shown]
[im 22/58  brain]
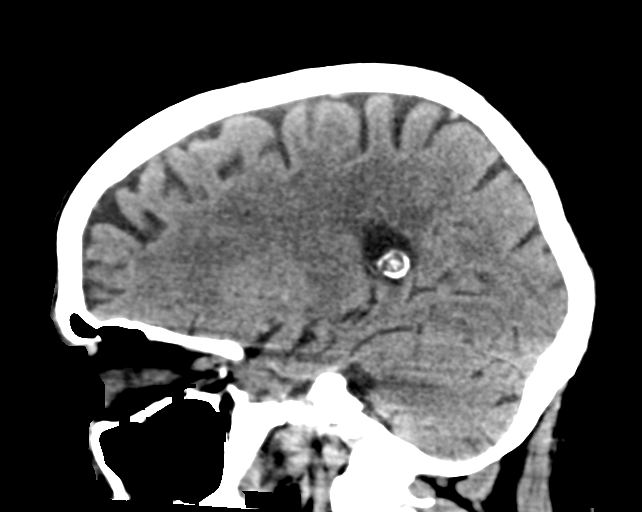
[im 29/58  brain]
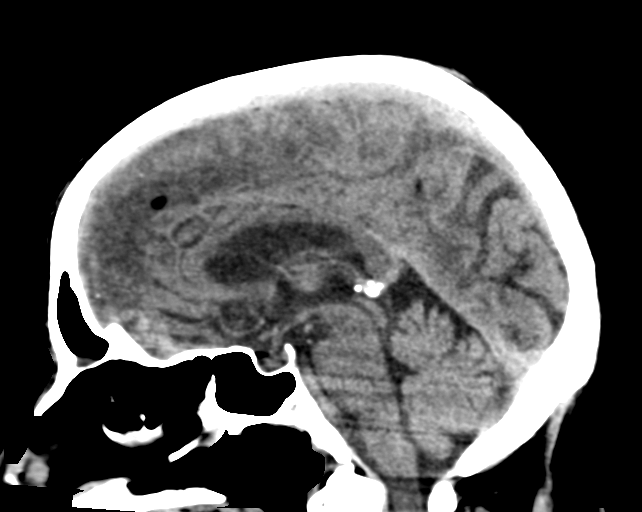
[im 36/58  brain]
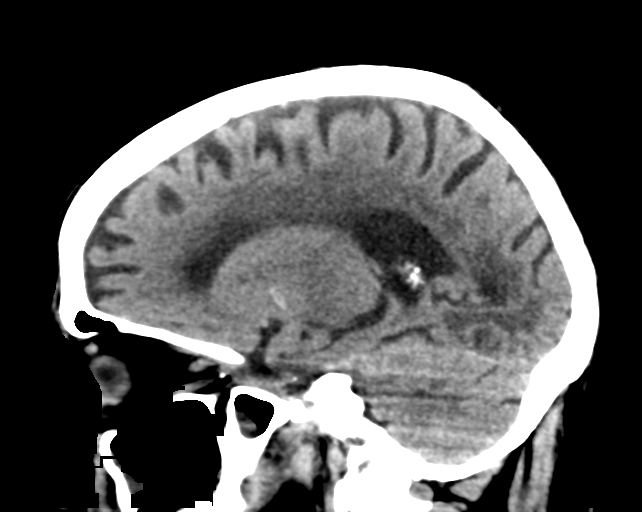

[17 of 47 positions shown; findings below may reference images not displayed]

FINDINGS: Brain: No evidence of acute infarction, hemorrhage, hydrocephalus,
extra-axial collection or mass lesion/mass effect. Remote left
occipital and lateral frontal cortex and white matter infarcts,
moderate in size. Confluent chronic small vessel ischemia in the
hemispheric white matter. No visible acute infarct. Stable sulcal
calcification along the right frontal convexity.

Vascular: Best seen on reformats is a branching hyperdensity in the
left M2 region. CTA is pending. Atherosclerosis

Skull: No acute finding

Sinuses/Orbits: No acute finding

Other: These results were communicated to [REDACTED] at [DATE] on
[DATE] by text page via the [employer] messaging system.
IMPRESSION: 1. Hyperdense left M2 branch with no hemorrhage or visible acute
infarct.
2. Chronic small vessel ischemia with remote left MCA and PCA branch
infarcts.

## 2021-06-09 IMAGING — DX DG CHEST 1V PORT
1 series · 1 of 1 positions shown · non-contrast
Comparison: [DATE]

CLINICAL DATA: ET tube placement

EXAM:
PORTABLE CHEST 1 VIEW

[chest]
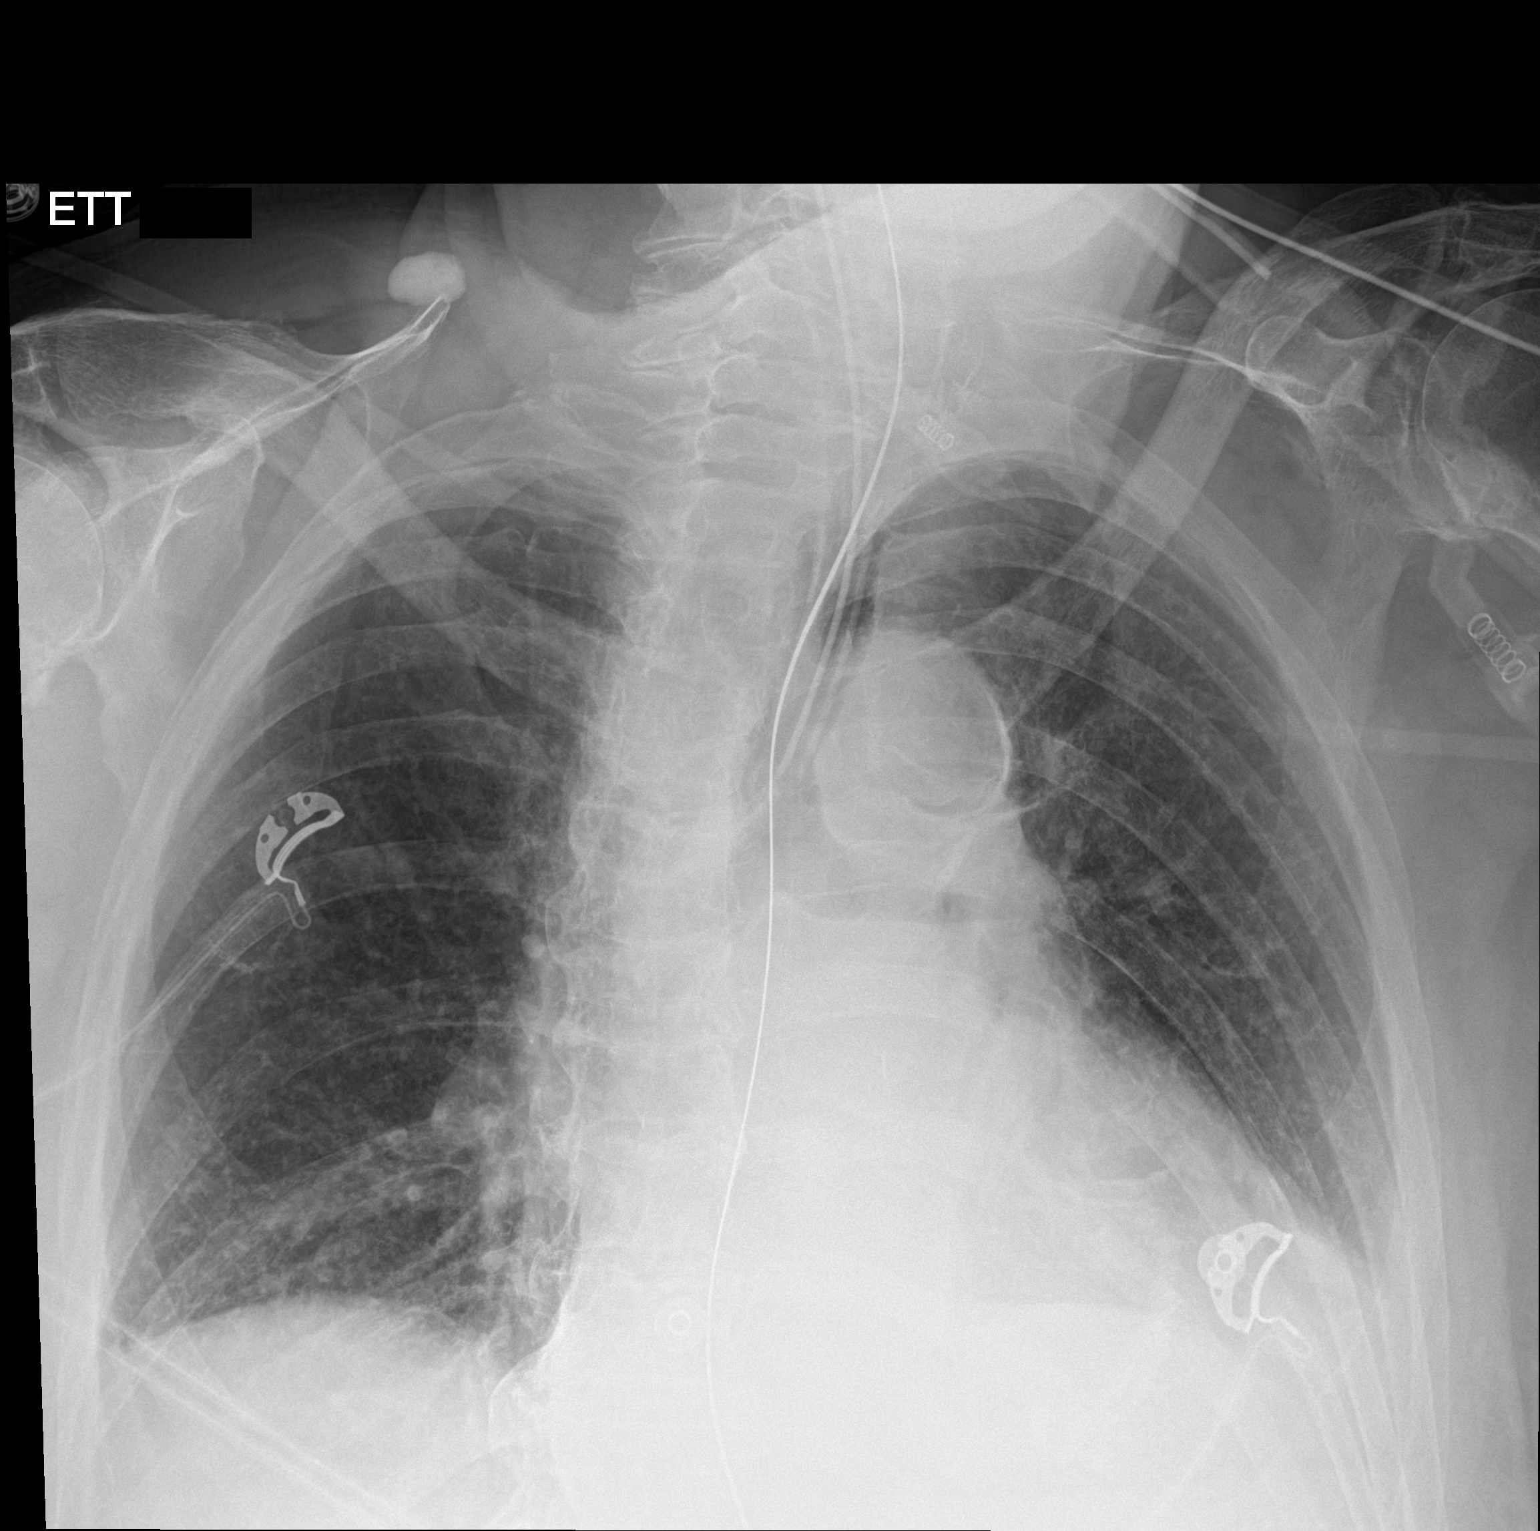

[1 of 1 positions shown; findings below may reference images not displayed]

FINDINGS: Endotracheal tube is 2 cm above the carina. NG tube enters the
stomach. Cardiomegaly. Lungs clear. No effusions or edema. Aortic
atherosclerosis. No acute bony abnormality.
IMPRESSION: ET tube 2 cm above the carina.

Cardiomegaly.  Aortic atherosclerosis.

No active disease.

## 2021-06-09 SURGERY — IR WITH ANESTHESIA
Anesthesia: General

## 2021-06-09 MED ORDER — CHLORHEXIDINE GLUCONATE 0.12% ORAL RINSE (MEDLINE KIT)
15.0000 mL | Freq: Two times a day (BID) | OROMUCOSAL | Status: DC
  Administered 2021-06-09 – 2021-06-10 (×2): 15 mL via OROMUCOSAL

## 2021-06-09 MED ORDER — CEFAZOLIN SODIUM-DEXTROSE 2-3 GM-%(50ML) IV SOLR
INTRAVENOUS | Status: DC | PRN
  Administered 2021-06-09: 2 g via INTRAVENOUS

## 2021-06-09 MED ORDER — IOHEXOL 300 MG/ML  SOLN
120.0000 mL | Freq: Once | INTRAMUSCULAR | Status: AC | PRN
  Administered 2021-06-09: 120 mL via INTRA_ARTERIAL

## 2021-06-09 MED ORDER — SUCCINYLCHOLINE CHLORIDE 200 MG/10ML IV SOSY: PREFILLED_SYRINGE | INTRAVENOUS | Status: DC | PRN

## 2021-06-09 MED ORDER — ACETAMINOPHEN 650 MG RE SUPP: 650.0000 mg | RECTAL | Status: DC | PRN

## 2021-06-09 MED ORDER — SODIUM CHLORIDE 0.9 % IV SOLN: INTRAVENOUS | Status: DC

## 2021-06-09 MED ORDER — STROKE: EARLY STAGES OF RECOVERY BOOK
Freq: Once | Status: AC
  Filled 2021-06-09 (×2): qty 1

## 2021-06-09 MED ORDER — SUCCINYLCHOLINE CHLORIDE 200 MG/10ML IV SOSY
PREFILLED_SYRINGE | INTRAVENOUS | Status: DC | PRN
  Administered 2021-06-09: 100 mg via INTRAVENOUS

## 2021-06-09 MED ORDER — ACETAMINOPHEN 325 MG PO TABS
650.0000 mg | ORAL_TABLET | ORAL | Status: DC | PRN
  Administered 2021-06-16 (×2): 650 mg via ORAL
  Administered 2021-06-17: 350 mg via ORAL
  Administered 2021-06-27: 650 mg via ORAL
  Filled 2021-06-09 (×4): qty 2

## 2021-06-09 MED ORDER — CLEVIDIPINE BUTYRATE 0.5 MG/ML IV EMUL
INTRAVENOUS | Status: AC
  Filled 2021-06-09: qty 50

## 2021-06-09 MED ORDER — CHLORHEXIDINE GLUCONATE CLOTH 2 % EX PADS
6.0000 | MEDICATED_PAD | Freq: Every day | CUTANEOUS | Status: DC
  Administered 2021-06-09 – 2021-06-23 (×15): 6 via TOPICAL

## 2021-06-09 MED ORDER — ORAL CARE MOUTH RINSE
15.0000 mL | OROMUCOSAL | Status: DC
  Administered 2021-06-09 – 2021-06-10 (×6): 15 mL via OROMUCOSAL

## 2021-06-09 MED ORDER — PHENYLEPHRINE HCL-NACL 20-0.9 MG/250ML-% IV SOLN
0.0000 ug/min | INTRAVENOUS | Status: DC
  Administered 2021-06-09: 20 ug/min via INTRAVENOUS

## 2021-06-09 MED ORDER — LIDOCAINE HCL (CARDIAC) PF 100 MG/5ML IV SOSY: PREFILLED_SYRINGE | INTRAVENOUS | Status: DC | PRN

## 2021-06-09 MED ORDER — PROPOFOL 10 MG/ML IV BOLUS
INTRAVENOUS | Status: DC | PRN
  Administered 2021-06-09: 100 mg via INTRAVENOUS
  Administered 2021-06-09 (×2): 50 mg via INTRAVENOUS

## 2021-06-09 MED ORDER — SENNOSIDES-DOCUSATE SODIUM 8.6-50 MG PO TABS: 1.0000 | ORAL_TABLET | Freq: Every evening | ORAL | Status: DC | PRN

## 2021-06-09 MED ORDER — CLEVIDIPINE BUTYRATE 0.5 MG/ML IV EMUL
0.0000 mg/h | INTRAVENOUS | Status: AC
  Administered 2021-06-09: 4 mg/h via INTRAVENOUS
  Filled 2021-06-09: qty 50

## 2021-06-09 MED ORDER — ACETAMINOPHEN 325 MG PO TABS: 650.0000 mg | ORAL_TABLET | ORAL | Status: DC | PRN

## 2021-06-09 MED ORDER — CEFAZOLIN SODIUM-DEXTROSE 2-4 GM/100ML-% IV SOLN
INTRAVENOUS | Status: AC
  Filled 2021-06-09: qty 100

## 2021-06-09 MED ORDER — ENOXAPARIN SODIUM 40 MG/0.4ML IJ SOSY
40.0000 mg | PREFILLED_SYRINGE | INTRAMUSCULAR | Status: DC
  Administered 2021-06-10: 40 mg via SUBCUTANEOUS
  Filled 2021-06-09: qty 0.4

## 2021-06-09 MED ORDER — LIDOCAINE 2% (20 MG/ML) 5 ML SYRINGE
INTRAMUSCULAR | Status: DC | PRN
  Administered 2021-06-09: 40 mg via INTRAVENOUS

## 2021-06-09 MED ORDER — PHENYLEPHRINE HCL-NACL 20-0.9 MG/250ML-% IV SOLN
INTRAVENOUS | Status: DC | PRN
  Administered 2021-06-09: 25 ug/min via INTRAVENOUS

## 2021-06-09 MED ORDER — NITROGLYCERIN 1 MG/10 ML FOR IR/CATH LAB
INTRA_ARTERIAL | Status: AC
  Filled 2021-06-09: qty 10

## 2021-06-09 MED ORDER — LACTATED RINGERS IV SOLN: INTRAVENOUS | Status: DC | PRN

## 2021-06-09 MED ORDER — PROPOFOL 1000 MG/100ML IV EMUL
5.0000 ug/kg/min | INTRAVENOUS | Status: DC
  Administered 2021-06-09: 30 ug/kg/min via INTRAVENOUS
  Administered 2021-06-09: 20 ug/kg/min via INTRAVENOUS
  Administered 2021-06-10: 40 ug/kg/min via INTRAVENOUS
  Filled 2021-06-09 (×4): qty 100

## 2021-06-09 MED ORDER — FENTANYL 2500MCG IN NS 250ML (10MCG/ML) PREMIX INFUSION
0.0000 ug/h | INTRAVENOUS | Status: DC
  Administered 2021-06-09: 50 ug/h via INTRAVENOUS

## 2021-06-09 MED ORDER — DEXAMETHASONE SODIUM PHOSPHATE 10 MG/ML IJ SOLN
INTRAMUSCULAR | Status: DC | PRN
  Administered 2021-06-09: 8 mg via INTRAVENOUS

## 2021-06-09 MED ORDER — ROCURONIUM BROMIDE 10 MG/ML (PF) SYRINGE
PREFILLED_SYRINGE | INTRAVENOUS | Status: DC | PRN
  Administered 2021-06-09 (×2): 20 mg via INTRAVENOUS
  Administered 2021-06-09: 40 mg via INTRAVENOUS

## 2021-06-09 MED ORDER — EPHEDRINE SULFATE-NACL 50-0.9 MG/10ML-% IV SOSY
PREFILLED_SYRINGE | INTRAVENOUS | Status: DC | PRN
  Administered 2021-06-09: 5 mg via INTRAVENOUS
  Administered 2021-06-09: 10 mg via INTRAVENOUS

## 2021-06-09 MED ORDER — FENTANYL CITRATE (PF) 100 MCG/2ML IJ SOLN
INTRAMUSCULAR | Status: DC | PRN
  Administered 2021-06-09 (×2): 50 ug via INTRAVENOUS

## 2021-06-09 MED ORDER — IOHEXOL 350 MG/ML SOLN
100.0000 mL | Freq: Once | INTRAVENOUS | Status: AC | PRN
  Administered 2021-06-09: 100 mL via INTRAVENOUS

## 2021-06-09 MED ORDER — ACETAMINOPHEN 160 MG/5ML PO SOLN: 650.0000 mg | ORAL | Status: DC | PRN

## 2021-06-09 MED ORDER — SODIUM CHLORIDE 0.9% FLUSH
3.0000 mL | Freq: Once | INTRAVENOUS | Status: AC
  Administered 2021-06-09: 3 mL via INTRAVENOUS

## 2021-06-09 MED ORDER — SODIUM CHLORIDE 0.9 % IV SOLN: 250.0000 mL | INTRAVENOUS | Status: DC

## 2021-06-09 MED ORDER — PHENYLEPHRINE HCL-NACL 20-0.9 MG/250ML-% IV SOLN
25.0000 ug/min | INTRAVENOUS | Status: DC
Start: 2021-06-09 — End: 2021-06-10

## 2021-06-09 MED ORDER — ROCURONIUM BROMIDE 100 MG/10ML IV SOLN: INTRAVENOUS | Status: DC | PRN

## 2021-06-09 MED ORDER — PROPOFOL 500 MG/50ML IV EMUL
INTRAVENOUS | Status: DC | PRN
  Administered 2021-06-09: 50 ug/kg/min via INTRAVENOUS

## 2021-06-09 NOTE — Progress Notes (Signed)
RT assisted with transportation of pt from IR to 4N17 while on full ventilatory support with no complaints. Pt tolerated well with SVS.

## 2021-06-09 NOTE — Progress Notes (Signed)
Unable to assess NIH, pt intubated and sedated

## 2021-06-09 NOTE — Progress Notes (Signed)
Pt remains intubated and sedated, unable to assess NIH

## 2021-06-09 NOTE — Progress Notes (Signed)
OT Cancellation Note  Patient Details Name: [name] MRN: [id number] DOB: April 18, 1941   Cancelled Treatment:    Reason Eval/Treat Not Completed: Active bedrest order;Patient not medically ready.  Pt underwent thrombectomy this am.  Nilsa Nutting., OTR/L Acute Rehabilitation Services Pager [phone] Office [phone]   Lucille Passy M 06/09/2021, 3:01 PM

## 2021-06-09 NOTE — ED Notes (Signed)
Report given to [facility] at this time.   

## 2021-06-09 NOTE — Anesthesia Procedure Notes (Signed)
Procedure Name: Intubation Date/Time: 06/09/2021 11:15 AM Performed by: Barrington Ellison, CRNA Pre-anesthesia Checklist: Patient identified, Emergency Drugs available, Suction available and Patient being monitored Patient Re-evaluated:Patient Re-evaluated prior to induction Oxygen Delivery Method: Circle System Utilized Preoxygenation: Pre-oxygenation with 100% oxygen Induction Type: IV induction, Rapid sequence and Cricoid Pressure applied Laryngoscope Size: Mac and 3 Grade View: Grade I Tube type: Oral Tube size: 7.5 mm Number of attempts: 1 Airway Equipment and Method: Stylet and Oral airway Placement Confirmation: ETT inserted through vocal cords under direct vision, positive ETCO2 and breath sounds checked- equal and bilateral Secured at: 22 cm Tube secured with: Tape Dental Injury: Teeth and Oropharynx as per pre-operative assessment

## 2021-06-09 NOTE — Anesthesia Preprocedure Evaluation (Addendum)
Anesthesia Evaluation  Patient identified by MRN, date of birth, ID band Patient confused    Reviewed: Allergy & Precautions, NPO status , Patient's Chart, lab work & pertinent test results  Airway Mallampati: II  TM Distance: >3 FB Neck ROM: Full    Dental  (+) Edentulous Lower, Edentulous Upper, Poor Dentition   Pulmonary neg pulmonary ROS, former smoker,    Pulmonary exam normal breath sounds clear to auscultation       Cardiovascular hypertension, Pt. on medications and Pt. on home beta blockers Normal cardiovascular exam Rhythm:Regular Rate:Normal  Echo 03/2021 1. Left ventricular ejection fraction, by estimation, is 60 to 65%. The left ventricle has normal function. The left ventricle has no regional wall motion abnormalities. There is mild left ventricular hypertrophy. Left ventricular diastolic parameters are indeterminate.  2. Right ventricular systolic function is normal. The right ventricular size is normal. There is mildly elevated pulmonary artery systolic pressure. The estimated right ventricular systolic pressure is [id number] mmHg.  3. Left atrial size was severely dilated.  4. Right atrial size was severely dilated.  5. The mitral valve is normal in structure. Mild mitral valve  regurgitation.    Neuro/Psych PSYCHIATRIC DISORDERS Dementia CVA    GI/Hepatic negative GI ROS, Neg liver ROS,   Endo/Other  negative endocrine ROS  Renal/GU negative Renal ROS     Musculoskeletal  (+) Arthritis ,   Abdominal (+) + obese,   Peds  Hematology negative hematology ROS (+)   Anesthesia Other Findings   Reproductive/Obstetrics                            Anesthesia Physical Anesthesia Plan  ASA: 3 and emergent  Anesthesia Plan: General   Post-op Pain Management:    Induction: Intravenous, Rapid sequence and Cricoid pressure planned  PONV Risk Score and Plan: 3 and Ondansetron,  Dexamethasone and Treatment may vary due to age or medical condition  Airway Management Planned: Oral ETT  Additional Equipment: Arterial line  Intra-op Plan:   Post-operative Plan: Extubation in OR  Informed Consent: I have reviewed the patients History and Physical, chart, labs and discussed the procedure including the risks, benefits and alternatives for the proposed anesthesia with the patient or authorized representative who has indicated his/her understanding and acceptance.     Dental advisory given  Plan Discussed with: CRNA  Anesthesia Plan Comments:        Anesthesia Quick Evaluation

## 2021-06-09 NOTE — Anesthesia Procedure Notes (Signed)
Arterial Line Insertion Start/End8/04/2021 11:18 AM, 06/09/2021 11:22 AM Performed by: Barrington Ellison, CRNA  Patient location: OOR procedure area. Patient sedated Left, radial was placed Catheter size: 20 G Hand hygiene performed  and maximum sterile barriers used   Procedure performed without using ultrasound guided technique. Following insertion, dressing applied and Biopatch. Post procedure assessment: normal  Patient tolerated the procedure well with no immediate complications.

## 2021-06-09 NOTE — Evaluation (Signed)
SLP Cancellation Note  Patient Details Name: [name] MRN: [id number] DOB: 04/09/1941   Cancelled treatment:       Reason Eval/Treat Not Completed: Other (comment);Patient not medically ready (pt intubated) Kathleen Lime, MS [facility] SLP Acute Rehab Services Office [phone] Pager [phone]   Macario Golds 06/09/2021, 5:57 PM

## 2021-06-09 NOTE — Progress Notes (Signed)
Assisted with pt transport to 4N. Report given via telephone and at bedside to 4N RN. Anesthesia and RT assisted with transport. Groin level 0 at hand off. Pt remains intubated, unable to assess NIH.

## 2021-06-09 NOTE — Consult Note (Signed)
NEUROLOGY CONSULTATION NOTE   Date of service: June 09, 2021 Patient Name: [name] MRN:  [id number] DOB:  July 06, 1941 Reason for consult: stroke code for aphasia Requesting physician: [name] _ _ _   _ __   _ __ _ _  __ __   _ __   __ _  History of Present Illness   80 yo woman with hx a fib recently taken off xarelto as o/p for gross hematuria, HTN, mild dementia, multiple prior ischemic infarcts most recently 5/26 (L hemispheric) who was BIB EMS this AM for acute onset R sided weakness and aphasia. LKW 0745. At baseline patient has mild cognitive impairment, is usually but not always oriented, walks with a walker, needs some assistance with ADLs, and lives with her family at home. mRS likely 3 or possibly 4. She was last seen normal at 0745. Shortly after that she was noted to be dragging her R arm and could not speak. Her RUE weakness resolved en route but on arrival she was unable to follow commands and could only say "[name]" in response to any question. Head CT NAICP ASPECTs 10 accounting for chronic deficits. tPA not administered 2/2 gross hematuria within pastg 2 weeks on xarelto and also recent strokes 2 mos ago. CTA showed acute L M2 occlusion with 51cc penumbra. Case d/w Dr. Estanislado Pandy and we agreed to offer the procedure to the family if they wished to proceed after discussion of risks. I explained the mechanical thrombectomy procedure to her son [name] by phone and explained the risks associated with it including a 10% risk of intracranial hemorrhage from the procedure. We also discussed the fact that given his mom's age and prior strokes her risk of complications would likely be even higher than this average. He and his wife, both joint HCPOA elected to proceed and code IR was called.    ROS   UTA 2/2 aphasia  Past History   Past Medical History:  Diagnosis Date   Cat bite of right lower leg [id number]   Cellulitis of lower leg 12/07/2014   Hypertension    History  reviewed. No pertinent surgical history. Family History  Problem Relation Age of Onset   Hypertension Mother    Alcohol abuse Father    Heart disease Father    Hypertension Father    Hyperlipidemia Sister    Hypertension Sister    Alcohol abuse Brother    Hypertension Brother    Social History   Socioeconomic History   Marital status: Widowed    Spouse name: Not on file   Number of children: Not on file   Years of education: Not on file   Highest education level: High school graduate  Occupational History   Occupation: retired   Tobacco Use   Smoking status: Former    Types: Cigarettes    Quit date: 11/04/1978    Years since quitting: 42.6   Smokeless tobacco: Never  Vaping Use   Vaping Use: Never used  Substance and Sexual Activity   Alcohol use: No   Drug use: No   Sexual activity: Never  Other Topics Concern   Not on file  Social History Narrative   Not on file   Social Determinants of Health   Financial Resource Strain: Not on file  Food Insecurity: No Food Insecurity   Worried About [place] in the Last Year: Never true   [place] in the Last Year: Never true  Transportation  Needs: No Transportation Needs   Lack of Transportation (Medical): No   Lack of Transportation (Non-Medical): No  Physical Activity: Not on file  Stress: Not on file  Social Connections: Not on file   Allergies  Allergen Reactions   Amlodipine     Leg swelling   Haldol [Haloperidol Lactate]     Neuroleptic Malignant syndrome    Hctz [Hydrochlorothiazide] Nausea Only and Rash    Medications   No current facility-administered medications for this encounter.  Current Outpatient Medications:    atorvastatin (LIPITOR) 40 MG tablet, Take 1 tablet (40 mg total) by mouth at bedtime., Disp: 90 tablet, Rfl: 1   ciprofloxacin (CIPRO) 500 MG tablet, Take 1 tablet (500 mg total) by mouth every 12 (twelve) hours., Disp: 6 tablet, Rfl: 0   Dextromethorphan-quiNIDine  (NUEDEXTA) 20-10 MG capsule, Take 1 capsule by mouth at bedtime., Disp: 90 capsule, Rfl: 1   diclofenac Sodium (VOLTAREN) 1 % GEL, Apply 4 g topically 4 (four) times daily., Disp: 100 g, Rfl: 12   hydrALAZINE (APRESOLINE) 25 MG tablet, Take 1 tablet (25 mg total) by mouth 2 (two) times daily., Disp: 180 tablet, Rfl: 0   nystatin (MYCOSTATIN) 100000 UNIT/ML suspension, Take 5 mLs (500,000 Units total) by mouth 4 (four) times daily., Disp: 60 mL, Rfl: 0   propranolol (INDERAL) 10 MG tablet, Take 1 tablet (10 mg total) by mouth 2 (two) times daily., Disp: 180 tablet, Rfl: 1   rivaroxaban (XARELTO) 20 MG TABS tablet, TAKE 1 TABLET BY MOUTH EVERY DAY WITH DINNER, Disp: 90 tablet, Rfl: 1 **not taking xarelto for at least several days    Vitals   Vitals:   06/09/21 0923 06/09/21 0931 06/09/21 0932  BP:  (!) 191/84   Pulse:  (!) 58   Resp:  20   Temp:  98.3 F (36.8 C)   TempSrc:  Oral   SpO2:  100%   Weight: 92.3 kg    Height:   '5\' 5"'$  (1.651 m)     Body mass index is 33.86 kg/m.  Physical Exam   Physical Exam Gen: alert but unable to follow commands or answer questions appropriately 2/2 aphasia HEENT: Atraumatic, normocephalic;mucous membranes moist; oropharynx clear, tongue without atrophy or fasciculations. Neck: Supple, trachea midline. Resp: CTAB, no w/r/r CV: RRR, no m/g/r; nml S1 and S2. 2+ symmetric peripheral pulses. Abd: soft/NT/ND; nabs x 4 quad Extrem: Nml bulk; no cyanosis, clubbing, or edema.  Neuro: *MS: alert but unable to follow commands or answer questions appropriately 2/2 aphasia *Speech: able to repeat first name only *CN:    I: Deferred   II,III: PERRLA, blinks to threat bilat   III,IV,VI: EOMI w/o nystagmus, no ptosis   V: Sensation intact from V1 to V3 to LT   VII: Eyelid closure was full.  R UMN facial droop   VIII: Hearing intact to voice   IX,X: Voice normal, palate elevates symmetrically    XI: SCM/trap 5/5 bilat   XII: Tongue protrudes midline, no  atrophy or fasciculations   *Motor:   Normal bulk.  No tremor, rigidity or bradykinesia. No pronator drift in BUE. BLE anti-gravity but drift to bed.  *Sensory: SILT  *Coordination:  UTA 2/2 comprehension *Reflexes:  2+ and symmetric throughout without clonus; toes down-going bilat *Gait: deferred  NIHSS  1a Level of Conscious.: 0 1b LOC Questions: 2 1c LOC Commands: 2 2 Best Gaze: 0 3 Visual: 0 4 Facial Palsy: 1 5a Motor Arm - left: 0 5b Motor Arm -  Right: 0 6a Motor Leg - Left: 2 6b Motor Leg - Right: 2 7 Limb Ataxia: 0 8 Sensory: 0 9 Best Language: 2 10 Dysarthria: 1 11 Extinct. and Inatten.: 0  TOTAL: 12   Premorbid mRS = 3   Labs   CBC:  Recent Labs  Lab 06/09/21 0854  WBC 5.4  NEUTROABS 3.2  HGB 13.8  HCT 40.8  MCV 90.5  PLT 317     Basic Metabolic Panel:  Lab Results  Component Value Date   NA 142 06/09/2021   K 4.0 06/09/2021   CO2 28 06/09/2021   GLUCOSE 94 06/09/2021   BUN 12 06/09/2021   CREATININE 0.88 06/09/2021   CALCIUM 9.3 06/09/2021   GFRNONAA >60 06/09/2021   GFRAA 83 08/30/2019   Lipid Panel:  Lab Results  Component Value Date   LDLCALC 73 03/30/2021   HgbA1c:  Lab Results  Component Value Date   HGBA1C 5.5 03/30/2021   Urine Drug Screen: No results found for: LABOPIA, COCAINSCRNUR, LABBENZ, AMPHETMU, THCU, LABBARB  Alcohol Level No results found for: [place]   Impression   80 yo woman with hx a fib recently taken off xarelto as o/p for gross hematuria, HTN, mild dementia, multiple prior ischemic infarcts most recently 5/26 (L hemispheric) who was BIB EMS this AM for acute onset R sided weakness and aphasia. Not a tPA candidate, see above. L M2 occlusion with 51cc penumbra. After informed consent obtained from son Lisa [place] Malecha after above discussion of the risks and benefits, Code IR activated.  Recommendations  - Emergency vehicle en route to transport patient to [facility] for emergent thrombectomy - Dr. Estanislado Pandy and  Dr. Lorrin Goodell notified by phone. - Dr. Lorrin Goodell will admit patient to [facility] neuro ICU after procedure ______________________________________________________________________   Thank you for the opportunity to take part in the care of this patient. If you have any further questions, please contact the neurology consultation attending.  Signed,  Su Monks, MD [facility] [phone]  If 7pm- 7am, please page neurology on call as listed in [place].

## 2021-06-09 NOTE — ED Notes (Signed)
Pharmacy at bedside

## 2021-06-09 NOTE — ED Provider Notes (Signed)
[facility] Emergency Department Provider Note   ____________________________________________   Event Date/Time   First MD Initiated Contact with Patient 06/09/21 [id number]     (approximate)  I have reviewed the triage vital signs and the nursing notes.   HISTORY  Chief Complaint Code Stroke History limited by patient's confusion   HPI [name] is a 80 y.o. female who was last known well at 35.  She comes in for code stroke with report of right-sided weakness slurry speech and altered mental status.  Patient has already been to CT.  Here in the emergency room she does not have any right-sided weakness speech does not seem to be very slurry and but she is confused and having difficulty following commands when I asked her to pull me toward her she pushes me away and vice versa she cannot understand touch my finger with her finger and cannot tell me what happened to her.  Patient does have a history of dementia and memory loss.        Past Medical History:  Diagnosis Date   Cat bite of right lower leg [id number]   Cellulitis of lower leg 12/07/2014   Hypertension     Patient Active Problem List   Diagnosis Date Noted   Memory loss 05/28/2021   Dementia without behavioral disturbance ([place]) 05/28/2021   Neuroleptic malignant syndrome 04/01/2021   History of CVA with residual deficit 03/30/2021   Acute confusion 03/29/2021   Delirium 03/29/2021   Cellulitis 03/29/2021   Chronic venous insufficiency 10/14/2016   Atrial fibrillation ([place]) 06/15/2015   Rash 06/15/2015   HTN (hypertension) 04/11/2015   Osteoarthritis 04/11/2015    History reviewed. No pertinent surgical history.  Prior to Admission medications   Medication Sig Start Date End Date Taking? Authorizing Provider  atorvastatin (LIPITOR) 40 MG tablet Take 1 tablet (40 mg total) by mouth at bedtime. 05/25/21   Park Liter P, DO  ciprofloxacin (CIPRO) 500 MG tablet Take 1 tablet (500 mg  total) by mouth every 12 (twelve) hours. 06/06/21   Billey Co, MD  Dextromethorphan-quiNIDine (NUEDEXTA) 20-10 MG capsule Take 1 capsule by mouth at bedtime. 05/24/21   Park Liter P, DO  diclofenac Sodium (VOLTAREN) 1 % GEL Apply 4 g topically 4 (four) times daily. 05/24/21   Park Liter P, DO  hydrALAZINE (APRESOLINE) 25 MG tablet Take 1 tablet (25 mg total) by mouth 2 (two) times daily. 06/05/21   Park Liter P, DO  nystatin (MYCOSTATIN) 100000 UNIT/ML suspension Take 5 mLs (500,000 Units total) by mouth 4 (four) times daily. 04/11/21   Danford, Suann Larry, MD  propranolol (INDERAL) 10 MG tablet Take 1 tablet (10 mg total) by mouth 2 (two) times daily. 05/24/21   Johnson, Megan P, DO  rivaroxaban (XARELTO) 20 MG TABS tablet TAKE 1 TABLET BY MOUTH EVERY DAY WITH DINNER 05/24/21   Johnson, Megan P, DO    Allergies Amlodipine, Haldol [haloperidol lactate], and Hctz [hydrochlorothiazide]  Family History  Problem Relation Age of Onset   Hypertension Mother    Alcohol abuse Father    Heart disease Father    Hypertension Father    Hyperlipidemia Sister    Hypertension Sister    Alcohol abuse Brother    Hypertension Brother     Social History Social History   Tobacco Use   Smoking status: Former    Types: Cigarettes    Quit date: 11/04/1978    Years since quitting: 42.6   Smokeless tobacco:  Never  Vaping Use   Vaping Use: Never used  Substance Use Topics   Alcohol use: No   Drug use: No    Review of Systems Unable to obtain  ____________________________________________   PHYSICAL EXAM:  VITAL SIGNS: ED Triage Vitals [06/09/21 0923]  Enc Vitals Group     BP      Pulse      Resp      Temp      Temp src      SpO2      Weight 203 lb 7.8 oz (92.3 kg)     Height      Head Circumference      Peak Flow      Pain Score      Pain Loc      Pain Edu?      Excl. in [place]?     Constitutional: Alert and oriented to person. Well appearing and in no acute  distress. Eyes: Conjunctivae are normal. PER EOMI. Head: Atraumatic. Nose: No congestion/rhinnorhea. Mouth/Throat: Mucous membranes are moist.  Oropharynx non-erythematous. Neck: No stridor. Cardiovascular: Normal rate, regular rhythm. Grossly normal heart sounds.  Good peripheral circulation. Respiratory: Normal respiratory effort.  No retractions. Lungs CTAB. Gastrointestinal: Soft and nontender. No distention. No abdominal bruits. No CVA tenderness. Musculoskeletal: No lower extremity tenderness nor edema.  No joint effusions. Neurologic:  Normal speech and language. No gross focal neurologic deficits are appreciated.  The patient is quite confused.  I am unsure of her baseline. Skin:  Skin is warm, dry and intact. No rash noted.   ____________________________________________   LABS (all labs ordered are listed, but only abnormal results are displayed)  Labs Reviewed  PROTIME-INR  APTT  CBC  DIFFERENTIAL  COMPREHENSIVE METABOLIC PANEL  CBG MONITORING, ED   ____________________________________________  EKG Pending  ____________________________________________  RADIOLOGY Gertha Calkin, personally viewed and evaluated these images (plain radiographs) as part of my medical decision making, as well as reviewing the written report by the radiologist.  ED MD interpretation: CT read by radiology reviewed by me shows old remote infarct and radiologist reading hyperdense left MCA to branch.  I reviewed the CT angio as well radiology still reading that.  Official radiology report(s): CT HEAD CODE STROKE WO CONTRAST  Result Date: 06/09/2021 CLINICAL DATA:  Code stroke. Right-sided weakness and slurred speech EXAM: CT HEAD WITHOUT CONTRAST TECHNIQUE: Contiguous axial images were obtained from the base of the skull through the vertex without intravenous contrast. COMPARISON:  03/30/2021 brain MRI FINDINGS: Brain: No evidence of acute infarction, hemorrhage, hydrocephalus,  extra-axial collection or mass lesion/mass effect. Remote left occipital and lateral frontal cortex and white matter infarcts, moderate in size. Confluent chronic small vessel ischemia in the hemispheric white matter. No visible acute infarct. Stable sulcal calcification along the right frontal convexity. Vascular: Best seen on reformats is a branching hyperdensity in the left M2 region. CTA is pending. Atherosclerosis Skull: No acute finding Sinuses/Orbits: No acute finding Other: These results were communicated to Dr Quinn Axe at 9:09 am on 06/09/2021 by text page via the [facility] messaging system. IMPRESSION: 1. Hyperdense left M2 branch with no hemorrhage or visible acute infarct. 2. Chronic small vessel ischemia with remote left MCA and PCA branch infarcts. Electronically Signed   By: Monte Fantasia M.D.   On: 06/09/2021 09:10   CT ANGIO HEAD NECK W WO CM W PERF (CODE STROKE)  Addendum Date: 06/09/2021   ADDENDUM REPORT: 06/09/2021 09:32 ADDENDUM: Critical Value/emergent results were called  by telephone at the time of interpretation on 06/09/2021 at 9:32 am to provider [facility] , who verbally acknowledged these results. Electronically Signed   By: Monte Fantasia M.D.   On: 06/09/2021 09:32   Result Date: 06/09/2021 CLINICAL DATA:  Stroke suspected. EXAM: CT ANGIOGRAPHY HEAD AND NECK CT PERFUSION BRAIN TECHNIQUE: Multidetector CT imaging of the head and neck was performed using the standard protocol during bolus administration of intravenous contrast. Multiplanar CT image reconstructions and MIPs were obtained to evaluate the vascular anatomy. Carotid stenosis measurements (when applicable) are obtained utilizing NASCET criteria, using the distal internal carotid diameter as the denominator. Multiphase CT imaging of the brain was performed following IV bolus contrast injection. Subsequent parametric perfusion maps were calculated using RAPID software. CONTRAST:  Dose is not currently none on this in progress  study. COMPARISON:  Head CT from earlier the same day FINDINGS: CTA NECK FINDINGS Aortic arch: Atheromatous plaque.  Three vessel branching. Right carotid system: Mild plaque at the bifurcation without stenosis or ulceration. Left carotid system: Mild calcified plaque at the bifurcation without stenosis or ulceration Vertebral arteries: No proximal subclavian atherosclerosis. Codominant vertebral arteries that are tortuous but widely patent to the dura. Skeleton: Advanced cervical spine degeneration with particularly severe right-sided C1-2 facet degeneration with bulky spurring impinging on the right C2 nerve root. C2-3 and C3-4 facet ankylosis. Other neck: No acute finding Upper chest: No acute finding Review of the MIP images confirms the above findings CTA HEAD FINDINGS Anterior circulation: Atheromatous plaque at the bilateral carotid siphon. Distal left M2 branch cut off with some downstream reconstitution. No additional branch occlusion is seen. Negative for aneurysm Posterior circulation: Vertebral and basilar arteries are smoothly contoured and widely patent. No branch occlusion, beading, or aneurysm. Venous sinuses: Diffusely patent Anatomic variants: None significant Review of the MIP images confirms the above findings CT Brain Perfusion Findings: ASPECTS: 10 when accounting for chronic changes CBF (<30%) Volume: 15m Perfusion (Tmax>6.0s) volume: 523mMismatch Volume: 511m page and call has been placed to neurology. IMPRESSION: 1. Emergent left M2 branch occlusion with penumbra pattern on CT perfusion involving a 51 cc area. 2. Mild for age atherosclerosis without flow limiting stenosis or visible embolic source. Electronically Signed: By: JonMonte FantasiaD. On: 06/09/2021 09:28    ____________________________________________   PROCEDURES  Procedure(s) performed (including Critical Care):  Procedures   ____________________________________________   INITIAL IMPRESSION / ASSESSMENT AND  PLAN / ED COURSE  Neurology sees the patient.  CT does show the occluded vessel.  [facility]              ____________________________________________   FINAL CLINICAL IMPRESSION(S) / ED DIAGNOSES  Final diagnoses:  [facility]   ED Discharge Orders     None        Note:  This document was prepared using Dragon voice recognition software and may include unintentional dictation errors.    MalNena PolioD 06/09/21 1007

## 2021-06-09 NOTE — ED Notes (Signed)
EDP McHugh cleared pt for CT

## 2021-06-09 NOTE — ED Notes (Signed)
Decision made for no TPA by Neurologist at bedside.

## 2021-06-09 NOTE — Progress Notes (Signed)
Patient ID: [name], female   DOB: [birth year], 80 y.o.   MRN: [id number] INR. 61 Y RT F MRS 3 LSW 7.45 am  New onset aphasia and Rt sided weakness. CT brain No ICH ASPECTS 10. CTA occluded dominant Lt MCA inf division. CTP 51 ml penumbra with zero core. Option of endovascular treatment discusses with daughter and son over the phone . Procedure ,reasons and alternatives reviewed . Risk of ICH of 10 % worsening neuro deficit,death and inability to revascularize discussed. Given the patients  co morbidities  and age , the patient would be at a greater risk of a complication and less favorable outcome. They expressed understanding and provided consent to  proceed. S.Dalal Livengood MD

## 2021-06-09 NOTE — Transfer of Care (Signed)
Immediate Anesthesia Transfer of Care Note  Patient: [name]  Procedure(s) Performed: IR WITH ANESTHESIA  Patient Location: ICU  Anesthesia Type:General  Level of Consciousness: Patient remains intubated per anesthesia plan  Airway & Oxygen Therapy: Patient remains intubated per anesthesia plan and Patient placed on Ventilator (see vital sign flow sheet for setting)  Post-op Assessment: Report given to RN  Post vital signs: Reviewed and stable  Last Vitals:  Vitals Value Taken Time  BP    Temp    Pulse    Resp 15 06/09/21 1341  SpO2 100 % 06/09/21 1337  Vitals shown include unvalidated device data.  Last Pain: There were no vitals filed for this visit.       Complications: No notable events documented.

## 2021-06-09 NOTE — ED Triage Notes (Signed)
Pt via EMS from home. Daughter called EMS for R sided weakness, slurred speech, and AMS. Pt is unable to answer questions correctly. Unknown baseline at this time. When asked different questions pt just keeps repeating her name. Pt is alert. EMS called Code Stroke with a LKW 0745. Pt has a hx of TIA in the past and pt is currently being treated for a UTI.

## 2021-06-09 NOTE — ED Notes (Signed)
Pt arrived to CT. 

## 2021-06-09 NOTE — Procedures (Signed)
S/P Lt common carotid arteriogram followed by complete revascularization of occluded Lt MCA inf division with x 1 pass with 75m x40 mm solitaireX retriever and contact aspiration achieving a TICI 3 revascularizarion Post CT brain >o ICH or mass effect. Hemostasis in the rt groin with a 25F angioeal and manual compression. Distal pulses all dopplerable. Patient left intubated awaiting covid test result. S.Ozetta Flatley MD

## 2021-06-09 NOTE — H&P (Signed)
NEUROLOGY CONSULTATION NOTE   Date of service: June 09, 2021 Patient Name: [name] MRN:  [id number] DOB:  07/20/1941 _ _ _   _ __   _ __ _ _  __ __   _ __   __ _  History of Present Illness  [name] is a 80 y.o. female with PMH significant for A. fib, recently taken off Xarelto for gross hematuria, hypertension, mild dementia, multiple prior ischemic stroke with most recent on 5/26 who presented to [facility] emergency department with acute onset aphasia and right-sided weakness.  Last known well was 0745.  CT head without contrast with no aspects of 10.  tPA was not offered due to recent strokes, gross materia recently.  CTA was obtained with left MCA M2 occlusion with 51 cc penumbra.  Case was discussed with neuro interventionalist and patient was transferred to [facility] for thrombectomy.  On arrival to [facility], still has persistent aphasia, keeps repeating her name in response to any question. Does not follow commands, does not mimic. RUE weakness resolved but does not withdraw to pinch in RUE.  NIHSS components Score: Comment  1a Level of Conscious 0'[x]'$  1'[]'$  2'[]'$  3'[]'$      1b LOC Questions 0'[]'$  1'[]'$  2'[x]'$       1c LOC Commands 0'[]'$  1'[]'$  2'[x]'$       2 Best Gaze 0'[x]'$  1'[]'$  2'[]'$       3 Visual 0'[x]'$  1'[]'$  2'[]'$  3'[]'$      4 Facial Palsy 0'[x]'$  1'[]'$  2'[]'$  3'[]'$      5a Motor Arm - left 0'[x]'$  1'[]'$  2'[]'$  3'[]'$  4'[]'$  UN'[]'$    5b Motor Arm - Right 0'[x]'$  1'[]'$  2'[]'$  3'[]'$  4'[]'$  UN'[]'$    6a Motor Leg - Left 0'[x]'$  1'[]'$  2'[]'$  3'[]'$  4'[]'$  UN'[]'$    6b Motor Leg - Right 0'[x]'$  1'[]'$  2'[]'$  3'[]'$  4'[]'$  UN'[]'$    7 Limb Ataxia 0'[x]'$  1'[]'$  2'[]'$  3'[]'$  UN'[]'$     8 Sensory 0'[]'$  1'[x]'$  2'[]'$  UN'[]'$      9 Best Language 0'[]'$  1'[]'$  2'[x]'$  3'[]'$      10 Dysarthria 0'[x]'$  1'[]'$  2'[]'$  UN'[]'$      11 Extinct. and Inattention 0'[x]'$  1'[]'$  2'[]'$       TOTAL: 7   mRS: 3 tPA: not offered 2/2 recent gross hematuria and recent stroke Thrombectomy: being taken to IR for a left MCA M2 occlusion.    ROS   Unable to obtain 2/2 aphasia.  Past History   Past Medical History:  Diagnosis Date   Cat bite of right  lower leg [id number]   Cellulitis of lower leg 12/07/2014   Hypertension    History reviewed. No pertinent surgical history. Family History  Problem Relation Age of Onset   Hypertension Mother    Alcohol abuse Father    Heart disease Father    Hypertension Father    Hyperlipidemia Sister    Hypertension Sister    Alcohol abuse Brother    Hypertension Brother    Social History   Socioeconomic History   Marital status: Widowed    Spouse name: Not on file   Number of children: Not on file   Years of education: Not on file   Highest education level: High school graduate  Occupational History   Occupation: retired   Tobacco Use   Smoking status: Former    Types: Cigarettes    Quit date: 11/04/1978    Years since quitting: 42.6   Smokeless tobacco: Never  Vaping Use   Vaping Use: Never used  Substance and Sexual Activity   Alcohol  use: No   Drug use: No   Sexual activity: Never  Other Topics Concern   Not on file  Social History Narrative   Not on file   Social Determinants of Health   Financial Resource Strain: Not on file  Food Insecurity: No Food Insecurity   Worried About Running Out of Food in the Last Year: Never true   Ran Out of Food in the Last Year: Never true  Transportation Needs: No Transportation Needs   Lack of Transportation (Medical): No   Lack of Transportation (Non-Medical): No  Physical Activity: Not on file  Stress: Not on file  Social Connections: Not on file   Allergies  Allergen Reactions   Amlodipine     Leg swelling   Haldol [Haloperidol Lactate]     Neuroleptic Malignant syndrome    Hctz [Hydrochlorothiazide] Nausea Only and Rash    Medications   Medications Prior to Admission  Medication Sig Dispense Refill Last Dose   atorvastatin (LIPITOR) 40 MG tablet Take 1 tablet (40 mg total) by mouth at bedtime. 90 tablet 1    ciprofloxacin (CIPRO) 500 MG tablet Take 1 tablet (500 mg total) by mouth every 12 (twelve) hours. 6 tablet 0     Dextromethorphan-quiNIDine (NUEDEXTA) 20-10 MG capsule Take 1 capsule by mouth at bedtime. 90 capsule 1    diclofenac Sodium (VOLTAREN) 1 % GEL Apply 4 g topically 4 (four) times daily. 100 g 12    hydrALAZINE (APRESOLINE) 25 MG tablet Take 1 tablet (25 mg total) by mouth 2 (two) times daily. 180 tablet 0    nystatin (MYCOSTATIN) 100000 UNIT/ML suspension Take 5 mLs (500,000 Units total) by mouth 4 (four) times daily. 60 mL 0    propranolol (INDERAL) 10 MG tablet Take 1 tablet (10 mg total) by mouth 2 (two) times daily. 180 tablet 1    rivaroxaban (XARELTO) 20 MG TABS tablet TAKE 1 TABLET BY MOUTH EVERY DAY WITH DINNER 90 tablet 1      Vitals   There were no vitals filed for this visit.   There is no height or weight on file to calculate BMI.  Physical Exam   General: Laying comfortably in bed; in no acute distress.  HENT: Normal oropharynx and mucosa. Normal external appearance of ears and nose.  Neck: Supple, no pain or tenderness  CV: No JVD. No peripheral edema.  Pulmonary: Symmetric Chest rise. Normal respiratory effort.  Abdomen: Soft to touch, non-tender.  Ext: No cyanosis, edema, or deformity  Skin: No rash. Normal palpation of skin.   Musculoskeletal: Normal digits and nails by inspection. No clubbing.   Neurologic Examination  Mental status/Cognition: Alert, smiling, does not answer any orientation questions secondary to aphasia. Speech/language: keeps repeating her name, unable to comprehend, unable to name objects, unable to repeat. Cranial nerves:   CN II Pupils equal and reactive to light, blinks to threat BL.   CN III,IV,VI Looks left and rightm no gaze preference.   CN V unable to assess   CN VII no asymmetry, no nasolabial fold flattening   CN VIII Turns head towards speech.   CN IX & X normal palatal elevation, no uvular deviation   CN XI    CN XII midline tongue protrusion   Motor:  Muscle bulk: normal, tone normal. Unable to do detailed strength testing  due to aphasia and unable to follow any commands.  She keeps both of her hands up in the air for more than 10 seconds and she  keeps both of her legs up in the air for more than 5 seconds.  Reflexes:  Right Left Comments  Pectoralis      Biceps (C5/6) 2 2   Brachioradialis (C5/6) 2 2    Triceps (C6/7) 2 2    Patellar (L3/4)      Achilles (S1)      Hoffman      Plantar     Jaw jerk    Sensation:  Light touch No response to a pinch in right upper extremity.  There is localized withdraws to pinch in all other extremities.   Pin prick    Temperature    Vibration   Proprioception    Coordination/Complex Motor:  Unable to assess but no obvious ataxia noted. Labs   CBC:  Recent Labs  Lab 06/09/21 0854  WBC 5.4  NEUTROABS 3.2  HGB 13.8  HCT 40.8  MCV 90.5  PLT [id number]    Basic Metabolic Panel:  Lab Results  Component Value Date   NA 142 06/09/2021   K 4.0 06/09/2021   CO2 28 06/09/2021   GLUCOSE 94 06/09/2021   BUN 12 06/09/2021   CREATININE 0.88 06/09/2021   CALCIUM 9.3 06/09/2021   GFRNONAA >60 06/09/2021   GFRAA 83 08/30/2019   Lipid Panel:  Lab Results  Component Value Date   LDLCALC 73 03/30/2021   HgbA1c:  Lab Results  Component Value Date   HGBA1C 5.5 03/30/2021   Urine Drug Screen: No results found for: LABOPIA, COCAINSCRNUR, LABBENZ, AMPHETMU, THCU, LABBARB  Alcohol Level No results found for: [place]  CT Head without contrast(personally reviewed): 1. Hyperdense left M2 branch with no hemorrhage or visible acute infarct. 2. Chronic small vessel ischemia with remote left MCA and PCA branch infarcts.  CT angio Head and Neck with contrast(personally reviewed): 1. Emergent left M2 branch occlusion with penumbra pattern on CT perfusion involving a 51 cc area. 2. Mild for age atherosclerosis without flow limiting stenosis or visible embolic source.  MRI Brain: pending   Impression   [name] is a 80 y.o. female with PMH significant for A. fib,  recently taken off Xarelto for gross hematuria, hypertension, mild dementia, multiple prior ischemic stroke with most recent on 5/26 who presented to [facility] emergency department with acute onset aphasia and right-sided weakness.  Found to have left MCA M2 occlusion.  tPA was not offered due to recent gross hematuria and recent strokes.  She was transferred to [facility] for emergent thrombectomy.  Her neurological exam was notable for persistent moderate to severe global aphasia with inability to comprehend or follow commands, unable to name objects or repeat.  She keeps repeating her name to any question that is asked.  Primary Diagnosis:  Cerebral infarction due to embolism of  left middle cerebral artery.   Secondary Diagnosis: Essential (primary) hypertension and Paroxysmal atrial fibrillation  Recommendations   Left middle cerebral artery embolic stroke: - Will adit to Neuro ICU post thrombectomy. - Frequent Neuro checks per stroke unit protocol - Imaging with MRI Brain without contrast - TTE pending. - LDL - continue home Atorvastatin '40mg'$  daily for now. - Recommend HbA1c - Antiplatelet or AC per Neuro IR within the first 24 hours after thrombectomy. - Recommend DVT ppx - Goal SBP per Neuro IR for the first 24 hours post Thrombectomy. - Recommend Telemetry monitoring for arrythmia - Recommend bedside swallow screen prior to PO intake. - Stroke education booklet - Recommend PT/OT/SLP consult  HTN: - Hold home  AntiHTNsives. Goal SBP as above   HLD: - continue home Lipitor, LDL pending.  Afibb: - was taken off Xarelto due to recent gross hematuria - Timing to resume AC based on MRI Brain. ______________________________________________________________________  This patient is critically ill and at significant risk of neurological worsening, death and care requires constant monitoring of vital signs, hemodynamics,respiratory and cardiac monitoring, neurological assessment,  discussion with family, other specialists and medical decision making of high complexity. I spent 50 minutes of neurocritical care time  in the care of  this patient. This was time spent independent of any time provided by nurse practitioner or PA.  Donnetta Simpers [facility] Pager Number [phone] 06/09/2021  12:04 PM   Thank you for the opportunity to take part in the care of this patient. If you have any further questions, please contact the neurology consultation attending.  Signed,  [place] Pager Number [phone] _ _ _   _ __   _ __ _ _  __ __   _ __   __ _

## 2021-06-09 NOTE — ED Notes (Signed)
Neurologist at bedside. 

## 2021-06-09 NOTE — Progress Notes (Signed)
   06/09/21 0845  Clinical Encounter Type  Visited With Patient  Visit Type Initial;Spiritual support;Social support;Code  Referral From Nurse  Consult/Referral To Chaplain   Chaplain responded to Code Stroke page. PT was actively being assessed by the medical team. Chaplain ministered with presence from hallway. Chaplain is available if needed further.

## 2021-06-09 NOTE — Consult Note (Signed)
NAME:  [name], MRN:  [id number], DOB:  12-04-1940, LOS: 0 ADMISSION DATE:  06/09/2021, CONSULTATION DATE: 06/09/2021 REFERRING MD: Dr. Estanislado Pandy, CHIEF COMPLAINT: Aphasia and right-sided weakness  History of Present Illness:  80 year old female with chronic atrial fibrillation on Xarelto which was recently taken off because of hematuria who presented to [facility] with acute onset of aphasia and right-sided weakness.  Stroke code was called, CT head noncontrast was negative, CTA head and neck showed left M2 occlusion.  Patient was not given tPA due to recent stroke and gross hematuria, she underwent mechanical thrombectomy with TICI 3 revascularization. Postprocedure patient remained intubated, PCCM was consulted for help with management.  Pertinent  Medical History   Past Medical History:  Diagnosis Date   Cat bite of right lower leg [id number]   Cellulitis of lower leg 12/07/2014   Hypertension      Significant Hospital Events: Including procedures, antibiotic start and stop dates in addition to other pertinent events   8/6 admitted, underwent mechanical thrombectomy and intubated  Interim History / Subjective:    Objective   Resp. rate 17, SpO2 100 %.    Vent Mode: PRVC FiO2 (%):  [100 %] 100 % Set Rate:  [18 bmp] 18 bmp Vt Set:  [450 mL] 450 mL PEEP:  [5 cmH20] 5 cmH20 Plateau Pressure:  [15 cmH20] 15 cmH20   Intake/Output Summary (Last 24 hours) at 06/09/2021 1447 Last data filed at 06/09/2021 1336 Gross per 24 hour  Intake --  Output 50 ml  Net -50 ml   There were no vitals filed for this visit.  Examination:   Physical exam: General: Crtitically ill-appearing elderly Caucasian female, orally intubated HEENT: [place]/AT, eyes anicteric.  ETT and OGT in place Neuro: Sedated, not following commands.  Eyes are closed.  Pupils 3 mm bilateral reactive to light Chest: Coarse breath sounds, no wheezes or rhonchi Heart: Irregularly irregular, no murmurs or gallops Abdomen: Soft,  nontender, nondistended, bowel sounds present Skin: No rash   Resolved Hospital Problem list     Assessment & Plan:  Acute left MCA stroke status post mechanical thrombectomy with TICI 3 Chronic atrial fibrillation, not on anticoagulation due to gross hematuria Hypertension Acute respiratory insufficiency postprocedure  Continue neuro watch every hour Stroke team is following Continue secondary stroke prophylaxis Keep right lower leg straight for 6 hours postprocedure Patient is in A. fib, heart rate is well controlled Patient blood pressure is elevated SBP goal is 140 Currently on clevidipine, titrate to meet the goal Continue lung protective ventilation As patient will require to keep her right leg straight to prevent groin hematoma, would like to keep patient sedated with propofol and kept her intubated    Best Practice (right click and "Reselect all SmartList Selections" daily)   Diet/type: NPO DVT prophylaxis: SCD GI prophylaxis: N/A Lines: N/A Foley:  N/A Code Status:  full code Last date of multidisciplinary goals of care discussion [Per primary team]  Labs   CBC: Recent Labs  Lab 06/09/21 0854  WBC 5.4  NEUTROABS 3.2  HGB 13.8  HCT 40.8  MCV 90.5  PLT [id number]    Basic Metabolic Panel: Recent Labs  Lab 06/09/21 0854  NA 142  K 4.0  CL 107  CO2 28  GLUCOSE 94  BUN 12  CREATININE 0.88  CALCIUM 9.3   GFR: Estimated Creatinine Clearance: 57.2 mL/min (by C-G formula based on SCr of 0.88 mg/dL). Recent Labs  Lab 06/09/21 0854  WBC 5.4  Liver Function Tests: Recent Labs  Lab 06/09/21 0854  AST 20  ALT 11  ALKPHOS 76  BILITOT 1.1  PROT 7.3  ALBUMIN 3.8   No results for input(s): LIPASE, AMYLASE in the last 168 hours. No results for input(s): AMMONIA in the last 168 hours.  ABG    Component Value Date/Time   PHART 7.46 (H) 03/29/2021 2001   PCO2ART 36 03/29/2021 2001   PO2ART 84 03/29/2021 2001   HCO3 25.6 03/29/2021 2001   O2SAT  96.8 03/29/2021 2001     Coagulation Profile: Recent Labs  Lab 06/09/21 0854  INR 1.0    Cardiac Enzymes: No results for input(s): CKTOTAL, CKMB, CKMBINDEX, TROPONINI in the last 168 hours.  HbA1C: HB A1C (BAYER DCA - WAIVED)  Date/Time Value Ref Range Status  01/05/2021 10:55 AM 5.4 <7.0 % Final    Comment:                                          Diabetic Adult            <7.0                                       Healthy Adult        4.3 - 5.7                                                           (DCCT/NGSP) American Diabetes Association's Summary of Glycemic Recommendations for Adults with Diabetes: Hemoglobin A1c <7.0%. More stringent glycemic goals (A1c <6.0%) may further reduce complications at the cost of increased risk of hypoglycemia.    Hgb A1c MFr Bld  Date/Time Value Ref Range Status  03/30/2021 05:15 AM 5.5 4.8 - 5.6 % Final    Comment:    (NOTE)         Prediabetes: 5.7 - 6.4         Diabetes: >6.4         Glycemic control for adults with diabetes: <7.0     CBG: Recent Labs  Lab 06/09/21 0852  GLUCAP 82    Review of Systems:   Unable to obtain as patient is intubated and sedated  Past Medical History:  She,  has a past medical history of Cat bite of right lower leg ([id number]), Cellulitis of lower leg (12/07/2014), and Hypertension.   Surgical History:  History reviewed. No pertinent surgical history.   Social History:   reports that she quit smoking about 42 years ago. Her smoking use included cigarettes. She has never used smokeless tobacco. She reports that she does not drink alcohol and does not use drugs.   Family History:  Her family history includes Alcohol abuse in her brother and father; Heart disease in her father; Hyperlipidemia in her sister; Hypertension in her brother, father, mother, and sister.   Allergies Allergies  Allergen Reactions   Amlodipine     Leg swelling   Haldol [Haloperidol Lactate]     Neuroleptic  Malignant syndrome    Hctz [Hydrochlorothiazide] Nausea Only and Rash     Home Medications  Prior  to Admission medications   Medication Sig Start Date End Date Taking? Authorizing Provider  atorvastatin (LIPITOR) 40 MG tablet Take 1 tablet (40 mg total) by mouth at bedtime. 05/25/21   Park Liter P, DO  ciprofloxacin (CIPRO) 500 MG tablet Take 1 tablet (500 mg total) by mouth every 12 (twelve) hours. 06/06/21   Billey Co, MD  Dextromethorphan-quiNIDine (NUEDEXTA) 20-10 MG capsule Take 1 capsule by mouth at bedtime. 05/24/21   Park Liter P, DO  diclofenac Sodium (VOLTAREN) 1 % GEL Apply 4 g topically 4 (four) times daily. 05/24/21   Park Liter P, DO  hydrALAZINE (APRESOLINE) 25 MG tablet Take 1 tablet (25 mg total) by mouth 2 (two) times daily. 06/05/21   Park Liter P, DO  nystatin (MYCOSTATIN) 100000 UNIT/ML suspension Take 5 mLs (500,000 Units total) by mouth 4 (four) times daily. 04/11/21   Danford, Suann Larry, MD  propranolol (INDERAL) 10 MG tablet Take 1 tablet (10 mg total) by mouth 2 (two) times daily. 05/24/21   Johnson, Megan P, DO  rivaroxaban (XARELTO) 20 MG TABS tablet TAKE 1 TABLET BY MOUTH EVERY DAY WITH DINNER 05/24/21   Park Liter P, DO     Total critical care time: 37 minutes  Performed by: [place] care time was exclusive of separately billable procedures and treating other patients.   Critical care was necessary to treat or prevent imminent or life-threatening deterioration.   Critical care was time spent personally by me on the following activities: development of treatment plan with patient and/or surrogate as well as nursing, discussions with consultants, evaluation of patient's response to treatment, examination of patient, obtaining history from patient or surrogate, ordering and performing treatments and interventions, ordering and review of laboratory studies, ordering and review of radiographic studies, pulse oximetry and  re-evaluation of patient's condition.   Jacky Kindle MD [facility] See Amion for pager If no response to pager, please call [phone] until 7pm After 7pm, Please call E-link [phone]

## 2021-06-09 NOTE — Progress Notes (Signed)
PT Cancellation Note  Patient Details Name: [name] MRN: [id number] DOB: 1941-01-19   Cancelled Treatment:    Reason Eval/Treat Not Completed: Patient not medically ready;Active bedrest order. Active orders on 8/6 at 1347 to have pt rest in bed with Rt leg straight until 7 am, s/p thrombectomy this date. Will plan to follow-up tomorrow as appropriate.   Moishe Spice, PT, DPT Acute Rehabilitation Services  Pager: [phone] Office: [place] 06/09/2021, 3:58 PM

## 2021-06-09 NOTE — Progress Notes (Signed)
Anesthesia present for case, pt intubated 

## 2021-06-09 NOTE — Progress Notes (Addendum)
eLink Physician-Brief Progress Note Patient Name: [name] Males DOB: [birth year] MRN: [id number]   Date of Service  06/09/2021  HPI/Events of Note  RN notifies me that the patient has not had CXR performed yet to confirm ETT (was intubated in IR).   eICU Interventions  Ordered CXR.   ADDENDUM 06/09/21 10:36 PM  - Reviewed CXR. ETT is in appropriate position ~2cm above the level of the carina.  Intervention Category [place] Interventions: Routine modifications to care plan (e.g. PRN medications for pain, fever)  Marily Lente Rosy Estabrook 06/09/2021, 9:40 PM

## 2021-06-09 NOTE — Progress Notes (Signed)
Unable to assess NIH, pt remains intubated and sedated

## 2021-06-10 ENCOUNTER — Inpatient Hospital Stay (HOSPITAL_COMMUNITY): Payer: Medicare HMO

## 2021-06-10 DIAGNOSIS — I724 Aneurysm of artery of lower extremity: Secondary | ICD-10-CM | POA: Diagnosis not present

## 2021-06-10 DIAGNOSIS — I6602 Occlusion and stenosis of left middle cerebral artery: Secondary | ICD-10-CM | POA: Diagnosis not present

## 2021-06-10 DIAGNOSIS — Z20822 Contact with and (suspected) exposure to covid-19: Secondary | ICD-10-CM | POA: Diagnosis not present

## 2021-06-10 DIAGNOSIS — I63412 Cerebral infarction due to embolism of left middle cerebral artery: Secondary | ICD-10-CM | POA: Diagnosis not present

## 2021-06-10 DIAGNOSIS — I482 Chronic atrial fibrillation, unspecified: Secondary | ICD-10-CM | POA: Diagnosis not present

## 2021-06-10 DIAGNOSIS — Z8673 Personal history of transient ischemic attack (TIA), and cerebral infarction without residual deficits: Secondary | ICD-10-CM

## 2021-06-10 DIAGNOSIS — I63512 Cerebral infarction due to unspecified occlusion or stenosis of left middle cerebral artery: Secondary | ICD-10-CM

## 2021-06-10 DIAGNOSIS — Z006 Encounter for examination for normal comparison and control in clinical research program: Secondary | ICD-10-CM | POA: Diagnosis not present

## 2021-06-10 DIAGNOSIS — G210 Malignant neuroleptic syndrome: Secondary | ICD-10-CM | POA: Diagnosis not present

## 2021-06-10 LAB — CBC WITH DIFFERENTIAL/PLATELET
Abs Immature Granulocytes: 0.02 10*3/uL (ref 0.00–0.07)
Basophils Absolute: 0 10*3/uL (ref 0.0–0.1)
Basophils Relative: 0 %
Eosinophils Absolute: 0 10*3/uL (ref 0.0–0.5)
Eosinophils Relative: 0 %
HCT: 37.4 % (ref 36.0–46.0)
Hemoglobin: 12.3 g/dL (ref 12.0–15.0)
Immature Granulocytes: 0 %
Lymphocytes Relative: 14 %
Lymphs Abs: 0.9 10*3/uL (ref 0.7–4.0)
MCH: 29.6 pg (ref 26.0–34.0)
MCHC: 32.9 g/dL (ref 30.0–36.0)
MCV: 90.1 fL (ref 80.0–100.0)
Monocytes Absolute: 0.3 10*3/uL (ref 0.1–1.0)
Monocytes Relative: 5 %
Neutro Abs: 5 10*3/uL (ref 1.7–7.7)
Neutrophils Relative %: 81 %
Platelets: 314 10*3/uL (ref 150–400)
RBC: 4.15 MIL/uL (ref 3.87–5.11)
RDW: 14.1 % (ref 11.5–15.5)
WBC: 6.2 10*3/uL (ref 4.0–10.5)
nRBC: 0 % (ref 0.0–0.2)

## 2021-06-10 LAB — LIPID PANEL
Cholesterol: 86 mg/dL (ref 0–200)
HDL: 48 mg/dL (ref >40)
LDL Cholesterol: 30 mg/dL (ref 0–99)
Total CHOL/HDL Ratio: 1.8 RATIO
Triglycerides: 42 mg/dL (ref <150)
VLDL: 8 mg/dL (ref 0–40)

## 2021-06-10 LAB — HEMOGLOBIN A1C
Hgb A1c MFr Bld: 5.3 % (ref 4.8–5.6)
Mean Plasma Glucose: 105.41 mg/dL

## 2021-06-10 LAB — BASIC METABOLIC PANEL
Anion gap: 10 (ref 5–15)
BUN: 11 mg/dL (ref 8–23)
CO2: 21 mmol/L — ABNORMAL LOW (ref 22–32)
Calcium: 8.7 mg/dL — ABNORMAL LOW (ref 8.9–10.3)
Chloride: 105 mmol/L (ref 98–111)
Creatinine, Ser: 0.58 mg/dL (ref 0.44–1.00)
GFR, Estimated: >60 mL/min (ref >60)
Glucose, Bld: 133 mg/dL — ABNORMAL HIGH (ref 70–99)
Potassium: 3.6 mmol/L (ref 3.5–5.1)
Sodium: 136 mmol/L (ref 135–145)

## 2021-06-10 LAB — ECHOCARDIOGRAM COMPLETE
AR max vel: 2.17 cm2
AV Area VTI: 2.28 cm2
AV Area mean vel: 2.25 cm2
AV Mean grad: 8 mmHg
AV Peak grad: 15.2 mmHg
Ao pk vel: 1.95 m/s
Height: 65 in
S' Lateral: 4 cm
Weight: 3255.75 oz

## 2021-06-10 LAB — TRIGLYCERIDES: Triglycerides: 49 mg/dL (ref <150)

## 2021-06-10 IMAGING — MR MR HEAD W/O CM
11 of 12 series · 42 of 48 positions shown · non-contrast
Comparison: None.

CLINICAL DATA: Neuro deficit, acute, stroke suspected

EXAM:
MRI HEAD WITHOUT CONTRAST
TECHNIQUE: Multiplanar, multiecho pulse sequences of the brain and surrounding
structures were obtained without intravenous contrast.

[Series 5: DWI · axial · 3.0mm · 0.88mm/px · z∈[-105,+33]mm · 5 of 48 slices shown (1 of 4)]
[im 1/48]
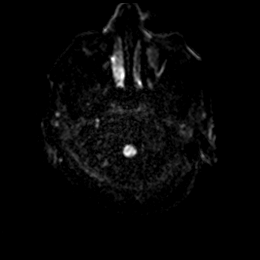
[im 12/48]
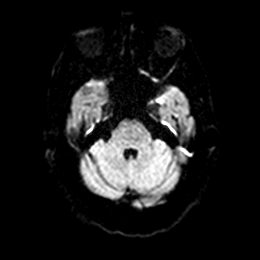
[im 24/48]
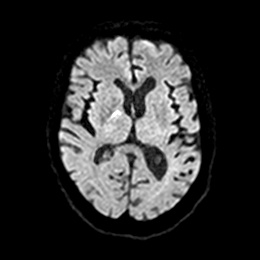
[im 36/48]
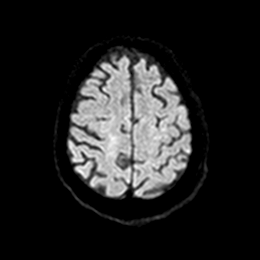
[im 48/48]
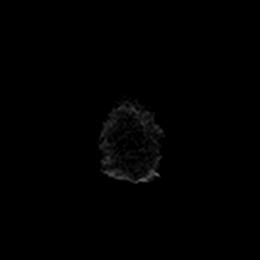

[Series 6: DWI · axial · 3.0mm · 0.88mm/px · z∈[-105,+33]mm · 5 of 48 slices shown (2 of 4)]
[im 1/48]
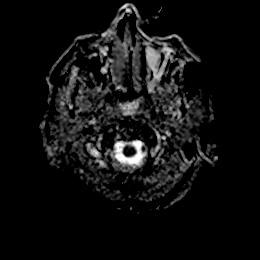
[im 12/48]
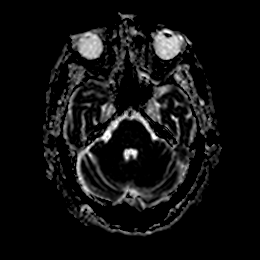
[im 24/48]
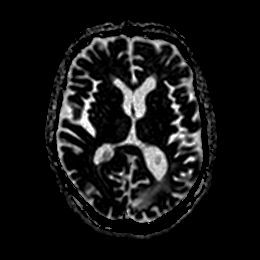
[im 36/48]
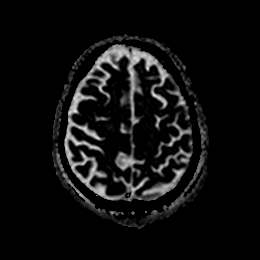
[im 48/48]
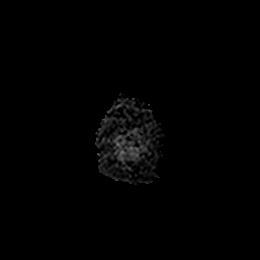

[Series 7: DWI · coronal · 4.0mm · 0.88mm/px · 3 of 32 slices shown (3 of 4)]
[im 1/32]
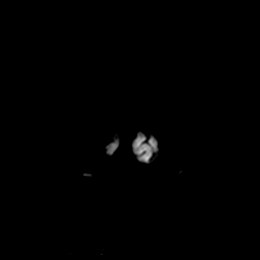
[im 16/32]
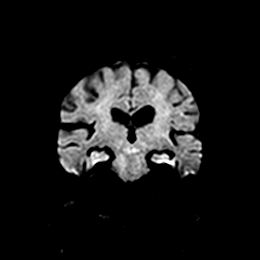
[im 32/32]
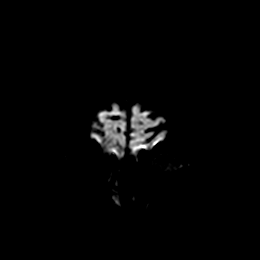

[Series 8: DWI · coronal · 4.0mm · 0.88mm/px · 3 of 32 slices shown (4 of 4)]
[im 1/32]
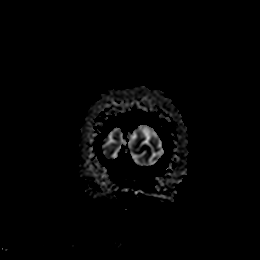
[im 16/32]
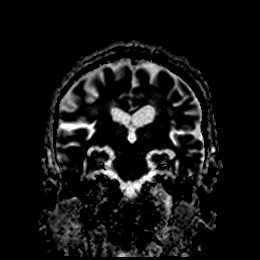
[im 32/32]
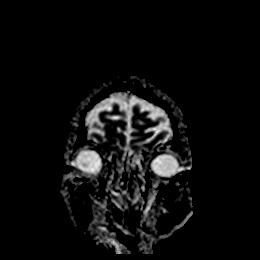

[Series 9: T1 · sagittal · 5.0mm · 0.94mm/px · 2 of 23 slices shown]
[im 1/23]
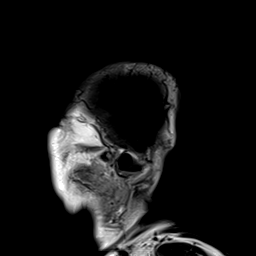
[im 23/23]
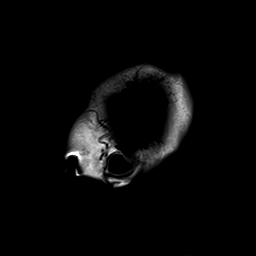

[Series 10: T2 · axial · 5.0mm · 0.90mm/px · z∈[-107,+34]mm · 2 of 25 slices shown (1 of 2)]
[im 1/25]
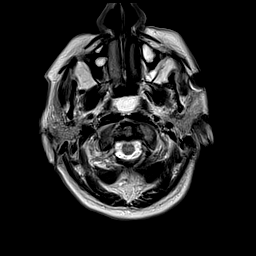
[im 25/25]
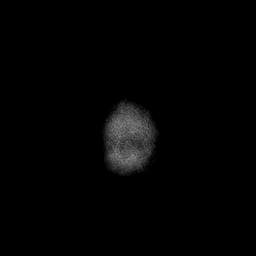

[Series 11: FLAIR · axial · 5.0mm · 0.45mm/px · z∈[-103,+38]mm · 2 of 25 slices shown]
[im 1/25]
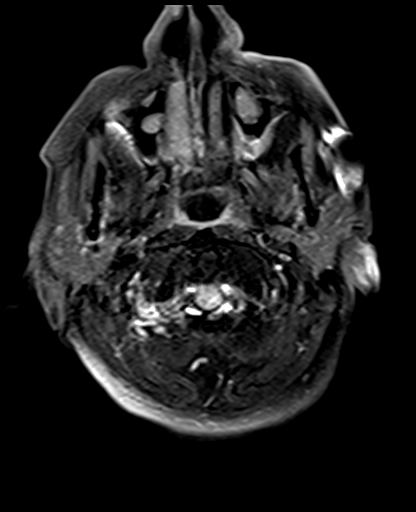
[im 25/25]
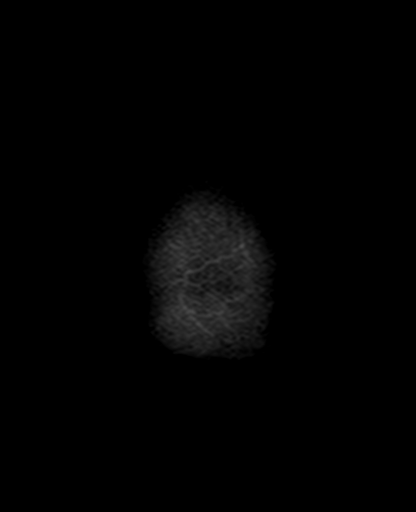

[Series 12: mag_images · axial · 3.0mm · 0.90mm/px · z∈[-119,+54]mm · 6 of 60 slices shown]
[im 1/60]
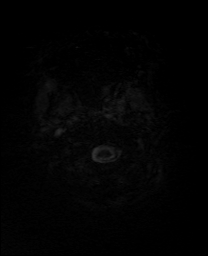
[im 12/60]
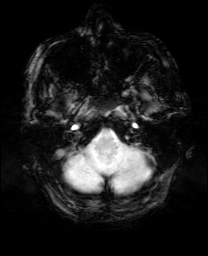
[im 24/60]
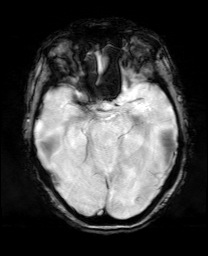
[im 36/60]
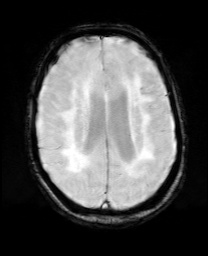
[im 48/60]
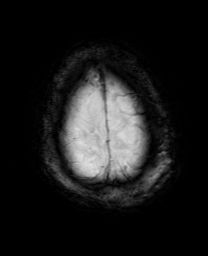
[im 60/60]
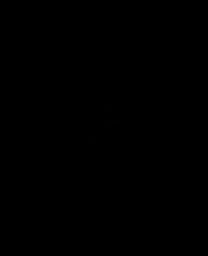

[Series 14: swi_images · axial · 3.0mm · 0.90mm/px · z∈[-119,+54]mm · 6 of 60 slices shown]
[im 1/60]
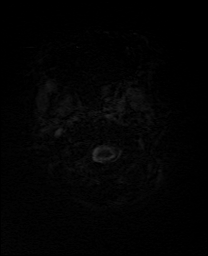
[im 12/60]
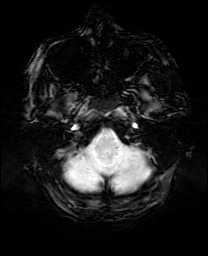
[im 24/60]
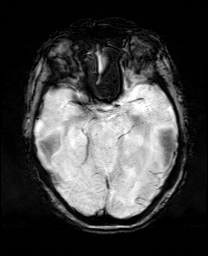
[im 36/60]
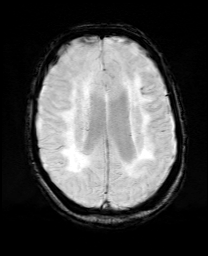
[im 48/60]
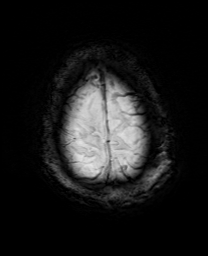
[im 60/60]
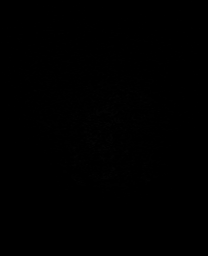

[Series 15: mip_images(sw) · axial · 24.0mm · 0.90mm/px · z∈[-109,+44]mm · 5 of 53 slices shown]
[im 1/53]
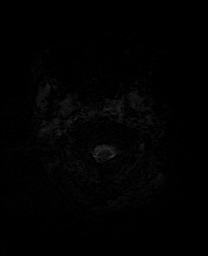
[im 14/53]
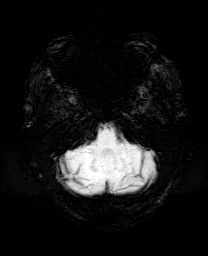
[im 27/53]
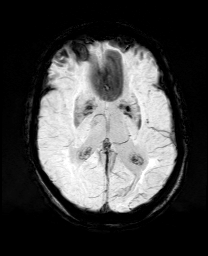
[im 40/53]
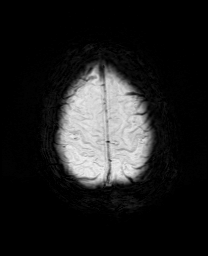
[im 53/53]
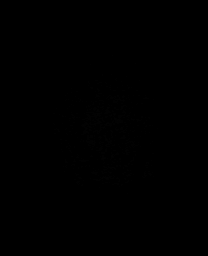

[Series 17: T2 · coronal · 5.0mm · 0.43mm/px · 3 of 29 slices shown (2 of 2)]
[im 1/29]
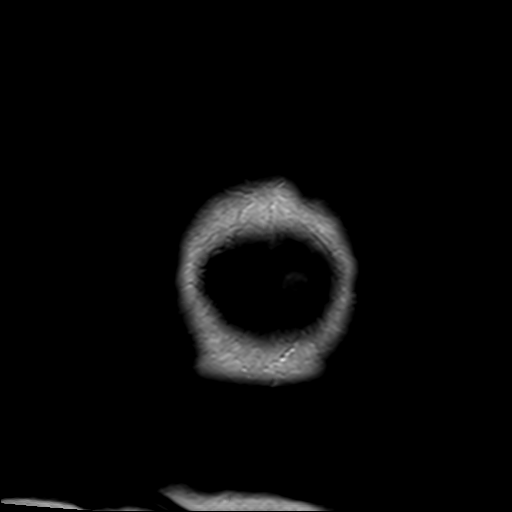
[im 15/29]
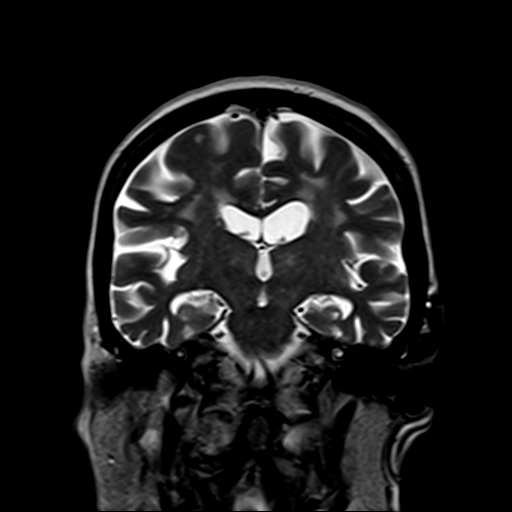
[im 29/29]
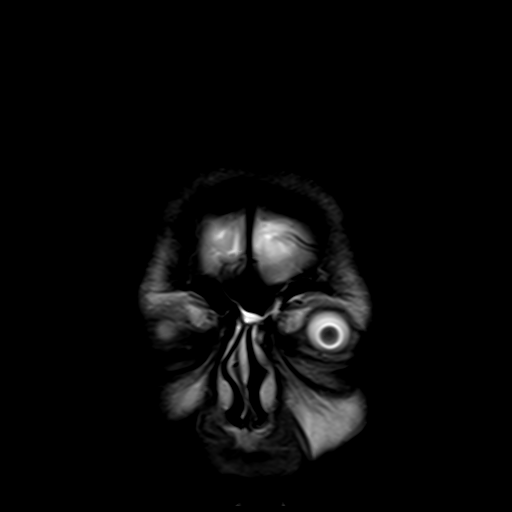

[42 of 48 positions shown; findings below may reference images not displayed]

FINDINGS: Brain: Multifocal acute ischemia within the right internal capsule,
left frontal white matter and left insula. Old left occipital
infarct and old left frontal infarct. Bilateral confluent
hyperintense T2-weighted signal in the white matter. Normal volume
of CSF spaces. No chronic microhemorrhage. Normal midline
structures.

Vascular: Major flow voids are preserved.

Skull and upper cervical spine: Normal calvarium and skull base.
Visualized upper cervical spine and soft tissues are normal.

Sinuses/Orbits:No paranasal sinus fluid levels or advanced mucosal
thickening. No mastoid or middle ear effusion. Normal orbits.
IMPRESSION: 1. Multifocal acute ischemia within the right internal capsule, left
frontal white matter and left insula. No hemorrhage or mass effect.
2. Old left occipital and left frontal infarcts.

## 2021-06-10 MED ORDER — ASPIRIN 81 MG PO CHEW
81.0000 mg | CHEWABLE_TABLET | Freq: Every day | ORAL | Status: DC
  Administered 2021-06-10: 81 mg
  Filled 2021-06-10: qty 1

## 2021-06-10 MED ORDER — QUETIAPINE FUMARATE 25 MG PO TABS
25.0000 mg | ORAL_TABLET | Freq: Every day | ORAL | Status: DC
  Administered 2021-06-10: 25 mg via ORAL
  Filled 2021-06-10: qty 1

## 2021-06-10 MED ORDER — PROPRANOLOL HCL 10 MG PO TABS: 10.0000 mg | ORAL_TABLET | Freq: Two times a day (BID) | ORAL | Status: DC

## 2021-06-10 MED ORDER — RIVAROXABAN 20 MG PO TABS
20.0000 mg | ORAL_TABLET | Freq: Every day | ORAL | Status: DC
  Administered 2021-06-10 – 2021-07-03 (×24): 20 mg via ORAL
  Filled 2021-06-10 (×24): qty 1

## 2021-06-10 MED ORDER — ATORVASTATIN CALCIUM 40 MG PO TABS: 40.0000 mg | ORAL_TABLET | Freq: Every day | ORAL | Status: DC

## 2021-06-10 MED ORDER — DEXMEDETOMIDINE HCL IN NACL 400 MCG/100ML IV SOLN
0.2000 ug/kg/h | INTRAVENOUS | Status: DC
  Administered 2021-06-10: 0.2 ug/kg/h via INTRAVENOUS
  Filled 2021-06-10: qty 100

## 2021-06-10 MED ORDER — QUETIAPINE FUMARATE 25 MG PO TABS
25.0000 mg | ORAL_TABLET | Freq: Once | ORAL | Status: AC
  Administered 2021-06-10: 25 mg via ORAL
  Filled 2021-06-10: qty 1

## 2021-06-10 MED ORDER — ATORVASTATIN CALCIUM 40 MG PO TABS
40.0000 mg | ORAL_TABLET | Freq: Every day | ORAL | Status: DC
  Administered 2021-06-10 – 2021-07-03 (×24): 40 mg via ORAL
  Filled 2021-06-10 (×24): qty 1

## 2021-06-10 MED ORDER — HYDRALAZINE HCL 25 MG PO TABS
25.0000 mg | ORAL_TABLET | Freq: Two times a day (BID) | ORAL | Status: DC
  Administered 2021-06-10 (×2): 25 mg via ORAL
  Filled 2021-06-10 (×2): qty 1

## 2021-06-10 NOTE — Evaluation (Signed)
Occupational Therapy Evaluation Patient Details Name: [name] MRN: [id number] DOB: 1941-10-25 Today's Date: 06/10/2021    History of Present Illness This 80 y.o. female admitted with acute onset Rt sided weakness and aphasia.  CTA showed acute L M2 oacclusion > thrombectomy.  MRI showed:  Multifocal acute ischemia within the right internal capsule, left frontal white matter and left insula. 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  PMH includes:  cat bite with cellulitis Lt Lower leg, HTN, h/o CVAs, mild dementia   Clinical Impression   Pt admitted with above. She demonstrates the below listed deficits and will benefit from continued OT to maximize safety and independence with BADLs.  Pt presents to OT with impaired cognition, decreased balance, communication deficits, ideational apraxia, perseveration.  She currently requires min A - mod A for UB ADLs, and max A for LB ADLs.  She lives with family PTA, and had 24 hour caregivers.  She was able to ambulate with RW with supervision - mod I, and was able to assist with ADLs.   Family is very supportive.  Recommend CIR level rehab to allow her to maximize her independence and safety with ADLs and functional mobility.      Follow Up Recommendations  CIR    Equipment Recommendations  None recommended by OT    Recommendations for Other Services Rehab consult     Precautions / Restrictions Precautions Precautions: Fall Precaution Comments: pt with dementia and aphasia      Mobility Bed Mobility Overal bed mobility: Needs Assistance Bed Mobility: Supine to Sit;Sit to Supine     Supine to sit: Min assist Sit to supine: Min assist   General bed mobility comments: assist to initiate movement, to lift trunk from bed and to lift LEs onto bed    Transfers Overall transfer level: Needs assistance Equipment used: Rolling walker (2 wheeled) Transfers: Sit to/from [facility] Sit to Stand: Min assist;+2  safety/equipment Stand pivot transfers: Min assist;+2 safety/equipment       General transfer comment: assist to steady and cues for walker use and safety    Balance Overall balance assessment: Needs assistance Sitting-balance support: Feet supported Sitting balance-[name]: Fair     Standing balance support: During functional activity;No upper extremity supported Standing balance-[name]: Fair                             ADL either performed or assessed with clinical judgement   ADL Overall ADL's : Needs assistance/impaired Eating/Feeding: Minimal assistance;Bed level   Grooming: Wash/dry hands;Wash/dry face;Oral care;Brushing hair;Minimal assistance;Standing Grooming Details (indicate cue type and reason): Pt demonstrated ability to use comb spontaneously.  She required verbal and gestural cues to comb Lt side and back of head.  Pt tried to comb hair with toothbrush, and required mod cues and min A to initiate brushing teeth.  However, once she initiated, she was able to complete task with min cues Upper Body Bathing: Moderate assistance;Sitting   Lower Body Bathing: Maximal assistance;Sit to/from stand   Upper Body Dressing : Moderate assistance;Sitting   Lower Body Dressing: Maximal assistance;Sit to/from stand   Toilet Transfer: Minimal assistance;+2 for safety/equipment;Ambulation;Comfort height toilet;Grab bars;RW   Toileting- Clothing Manipulation and Hygiene: Maximal assistance;Sit to/from stand       Functional mobility during ADLs: Minimal assistance;+2 for safety/equipment;Rolling walker       Vision Baseline Vision/History: No visual deficits Additional Comments: unable to accurately assess due to  communication deficits. She does not appear to scan her environment efficiently     Perception Perception Perception Tested?: Yes   Praxis Praxis Praxis tested?: Deficits Deficits: Ideomotor Praxis-Other Comments: see comments under  grooming    Pertinent Vitals/Pain Pain Assessment: Faces Pain Score: 0-No pain Faces Pain Scale: No hurt     Hand Dominance Right   Extremity/Trunk Assessment Upper Extremity Assessment Upper Extremity Assessment: Overall WFL for tasks assessed   Lower Extremity Assessment Lower Extremity Assessment: Defer to PT evaluation   Cervical / Trunk Assessment Cervical / Trunk Assessment: Kyphotic   Communication Communication Communication: Expressive difficulties;Receptive difficulties   Cognition Arousal/Alertness: Awake/alert Behavior During Therapy: WFL for tasks assessed/performed Overall Cognitive Status: Difficult to assess                                 General Comments: Pt able to follow simple commands with cues and gestures.  Pt very pleasant   General Comments  pt's daughter in law and son present during eval    Exercises     Shoulder Instructions      Home Living Family/patient expects to be discharged to:: Private residence   Available Help at Discharge: Family Type of Home: Mobile home Home Access: Stairs to enter Entrance Stairs-Number of Steps: 4 Entrance Stairs-Rails: [place]: One level     Bathroom Shower/Tub: Teacher, early years/pre: [place]: Environmental consultant - 4 wheels;Shower seat   Additional Comments: Daughter in law is primary caregiver.  She works during the day and has a family friend who stays with pt during the day.      Prior Functioning/Environment Level of Independence: Needs assistance  Gait / Transfers Assistance Needed: Pt ambulates with RW in the house.  She has been working with [place].  She requires cues to use the RW ADL's / Homemaking Assistance Needed: Pt was receiving HHOT.  Pt was able to self feed and go to the BR mod I.  She requires assist with bathing and dressing, but is able to help with these tasks            OT Problem List: Impaired balance (sitting and/or  standing);Decreased activity tolerance;Impaired vision/perception;Decreased cognition;Decreased safety awareness;Decreased knowledge of use of DME or AE;Decreased knowledge of precautions      OT Treatment/Interventions: Self-care/ADL training;Neuromuscular education;Therapeutic exercise;DME and/or AE instruction;Therapeutic activities;Cognitive remediation/compensation;Visual/perceptual remediation/compensation;Patient/family education;Balance training    OT Goals(Current goals can be found in the care plan section) Acute Rehab OT Goals Patient Stated Goal: for her to return to baseline OT Goal Formulation: With patient/family Time For Goal Achievement: 06/24/21 Potential to Achieve Goals: Good ADL Goals Pt Will Perform Eating: with set-up;with supervision;sitting Pt Will Perform Grooming: with min guard assist;standing Pt Will Perform Upper Body Bathing: with min assist;sitting Pt Will Perform Lower Body Bathing: with min assist;sit to/from stand Pt Will Perform Upper Body Dressing: with min assist;sitting Pt Will Perform Lower Body Dressing: with min assist;sit to/from stand Pt Will Transfer to Toilet: with min guard assist;ambulating;regular height toilet;bedside commode;grab bars Pt Will Perform Toileting - Clothing Manipulation and hygiene: with min guard assist;sit to/from stand  OT Frequency: Min 2X/week   Barriers to D/C:            Co-evaluation PT/OT/SLP Co-Evaluation/Treatment: Yes Reason for Co-Treatment: For patient/therapist safety;To address functional/ADL transfers;Necessary to address cognition/behavior during functional activity   OT goals addressed during session:  ADL's and self-care      AM-PAC OT "6 Clicks" Daily Activity     Outcome Measure Help from another person eating meals?: A Little Help from another person taking care of personal grooming?: A Little Help from another person toileting, which includes using toliet, bedpan, or urinal?: A Lot Help from  another person bathing (including washing, rinsing, drying)?: A Lot Help from another person to put on and taking off regular upper body clothing?: A Lot Help from another person to put on and taking off regular lower body clothing?: A Lot 6 Click Score: 14   End of Session Equipment Utilized During Treatment: Surveyor, mining Communication: Mobility status  Activity Tolerance: Patient tolerated treatment well Patient left: in bed;with call bell/phone within reach;with bed alarm set;with restraints reapplied;with family/visitor present;with nursing/sitter in room  OT Visit Diagnosis: Unsteadiness on feet (R26.81);Cognitive communication deficit (R41.841) Symptoms and signs involving cognitive functions: Cerebral infarction                Time: [id number] OT Time Calculation (min): 49 min Charges:  OT General Charges $OT Visit: 1 Visit OT Evaluation $OT Eval Moderate Complexity: 1 Mod OT Treatments $Self Care/Home Management : 8-22 mins  Nilsa Nutting., OTR/L Acute Rehabilitation Services Pager [phone] Office [phone]   Lucille Passy M 06/10/2021, 2:38 PM

## 2021-06-10 NOTE — Progress Notes (Signed)
Wasted 273m of Fentanyl from bag into steri bin in the med room. Witnessed by JDonnita Falls RN.

## 2021-06-10 NOTE — Evaluation (Signed)
Speech Language Pathology Evaluation Patient Details Name: [name] MRN: [id number] DOB: 06-May-1941 Today's Date: 06/10/2021 Time: 1140-1150 SLP Time Calculation (min) (ACUTE ONLY): 10 min  Problem List:  Patient Active Problem List   Diagnosis Date Noted   Stroke ([place]) 06/09/2021   Acute ischemic left MCA stroke ([place]) 06/09/2021   Middle cerebral artery embolism, left 06/09/2021   Acute ischemic stroke [facility])    Memory loss 05/28/2021   Dementia without behavioral disturbance ([place]) 05/28/2021   Neuroleptic malignant syndrome 04/01/2021   History of CVA with residual deficit 03/30/2021   Acute confusion 03/29/2021   Delirium 03/29/2021   Cellulitis 03/29/2021   Chronic venous insufficiency 10/14/2016   Atrial fibrillation ([place]) 06/15/2015   Rash 06/15/2015   HTN (hypertension) 04/11/2015   Osteoarthritis 04/11/2015   Past Medical History:  Past Medical History:  Diagnosis Date   Cat bite of right lower leg [id number]   Cellulitis of lower leg 12/07/2014   Hypertension    Past Surgical History: History reviewed. No pertinent surgical history. HPI:  [name] is a 80 y.o. female with PMH significant for A. fib, recently taken off Xarelto for gross hematuria, hypertension, mild dementia, multiple prior ischemic stroke with most recent on 5/26 who presented to [facility] emergency department with acute onset aphasia and right-sided weakness. CTA was obtained with left MCA M2 occlusion with 51 cc penumbra.  Case was discussed with neuro interventionalist and patient was transferred to [facility] for thrombectomy.  MRI of the brain was showing multifocal acute ischemia within the right internal capsule, left frontal white matter and left insula as well as old left occipital and frontal infarcts.  She was intubated briefly.  Per RN family reported onset of cognitive issues in May.  She was scheduled to see a neurologist.   Assessment / Plan / Recommendation Clinical  Impression  Patient seen for assessment of her language skills given admission due to acute onset of aphasia.  She was given portions of the [place].  She presented with a severe receptive and expressive aphasia.  She was unable to name objects even given max cues.  She was able to count to 10 given max verbal cues but was unable to state the days of the week.  During the repetition task she was noted to spell the word "pot" rather then repeat it.  She stated "parrot" for carrot and when asked to repeat the word alphabet she stated the ABC's.  She was unable to answer yes/no questions, follow any commands or identify objects.  She reponded to most questions with her name ie [name] or [name].  At times, she was able to say short sentences but they were not in response to presented stimuli and did not always relate to the current task.  She will require intense post acute rehab to address deficits in an attempt to maxmize her functional status.  ST will follow during acute stay.    SLP Assessment  SLP Recommendation/Assessment: Patient needs continued Speech Lanaguage Pathology Services SLP Visit Diagnosis: Aphasia (R47.01)    Follow Up Recommendations  Other (comment) (intense post acute rehab)    Frequency and Duration min 2x/week  2 weeks      SLP Evaluation           Comprehension  Auditory Comprehension Overall Auditory Comprehension: Impaired Yes/No Questions: Not tested Commands: Impaired One Step Basic Commands: 0-24% accurate Reading Comprehension Reading Status: Not tested    Expression Expression Primary  Mode of Expression: Verbal Verbal Expression Initiation: Impaired Automatic Speech: Name Level of Generative/Spontaneous Verbalization: Word;Phrase Repetition: Impaired Level of Impairment: Word level Naming: Impairment Responsive: 0-25% accurate Convergent: Not tested Divergent: Not tested Verbal Errors: Not aware of  errors;Perseveration;Phonemic paraphasias Pragmatics: No impairment Non-Verbal Means of Communication: Not applicable Written Expression Written Expression: Not tested   Oral / Motor  Oral Motor/Sensory Function Overall Oral Motor/Sensory Function: Other (comment) (Unable to assess)   GO                   Shelly Flatten, MA, CCC-SLP Acute Rehab SLP [phone]  Lamar Sprinkles 06/10/2021, 1:12 PM

## 2021-06-10 NOTE — Progress Notes (Signed)
  Echocardiogram 2D Echocardiogram has been performed.  [name] F 06/10/2021, 2:02 PM

## 2021-06-10 NOTE — Progress Notes (Signed)
NAME:  [name], MRN:  [id number], DOB:  1941-07-29, LOS: 1 ADMISSION DATE:  06/09/2021, CONSULTATION DATE: 06/09/2021 REFERRING MD: Dr. Estanislado Pandy, CHIEF COMPLAINT: Aphasia and right-sided weakness  History of Present Illness:  80 year old female with chronic atrial fibrillation on Xarelto which was recently taken off because of hematuria who presented to [facility] with acute onset of aphasia and right-sided weakness.  Stroke code was called, CT head noncontrast was negative, CTA head and neck showed left M2 occlusion.  Patient was not given tPA due to recent stroke and gross hematuria, she underwent mechanical thrombectomy with TICI 3 revascularization. Postprocedure patient remained intubated, PCCM was consulted for help with management.  Significant Hospital Events: Including procedures, antibiotic start and stop dates in addition to other pertinent events   8/6 admitted, underwent mechanical thrombectomy and intubated 8/7: Tolerated pressure support trial, extubated this morning  Interim History / Subjective:  Patient was agitated and restless in the bed, placed on pressure support trial, sedation was stopped and extubated, tolerating well so far Remained afebrile  Objective   Blood pressure 139/71, pulse 64, temperature (!) 97 F (36.1 C), temperature source Axillary, resp. rate (!) 24, SpO2 100 %.    Vent Mode: PSV;CPAP FiO2 (%):  [40 %-100 %] 40 % Set Rate:  [18 bmp] 18 bmp Vt Set:  [450 mL] 450 mL PEEP:  [5 cmH20] 5 cmH20 Pressure Support:  [5 cmH20] 5 cmH20 Plateau Pressure:  [12 cmH20-15 cmH20] 12 cmH20   Intake/Output Summary (Last 24 hours) at 06/10/2021 1035 Last data filed at 06/10/2021 1000 Gross per 24 hour  Intake 1504.24 ml  Output 1250 ml  Net 254.24 ml   There were no vitals filed for this visit.  Examination:   Physical exam: General: Crtitically ill-appearing elderly Caucasian female, lying on the bed, agitated HEENT: [place]/AT, eyes anicteric.  Moist mucous  membranes Neuro: Alert, awake, perseverating her name, moving all 4 extremities spontaneously.  Intermittently following commands Chest: Coarse breath sounds, no wheezes or rhonchi Heart: Irregularly irregular, no murmurs or gallops Abdomen: Soft, nontender, nondistended, bowel sounds present Skin: No rash   Resolved Hospital Problem list     Assessment & Plan:  Acute left MCA stroke status post mechanical thrombectomy with TICI 3 Chronic atrial fibrillation, not on anticoagulation due to gross hematuria Hypertension Acute respiratory insufficiency postprocedure, resolved Acute hyperactive delirium  Continue neuro watch every hour Stroke team is following Continue secondary stroke prophylaxis Continue aspirin and atorvastatin Patient is in A. fib, heart rate is well controlled When to restart anticoagulation, defer to stroke team Successfully extubated this morning Patient's family is here, now she is little bit calm She may require Precedex if continued remain agitated   Best Practice (right click and "Reselect all SmartList Selections" daily)   Diet/type: NPO speech and swallow pending DVT prophylaxis: Enoxaparin GI prophylaxis: N/A Lines: N/A Foley:  N/A Code Status:  full code Last date of multidisciplinary goals of care discussion [Per primary team]  Labs   CBC: Recent Labs  Lab 06/09/21 0854 06/09/21 1456 06/09/21 1501 06/10/21 0559  WBC 5.4 7.4  --  6.2  NEUTROABS 3.2  --   --  5.0  HGB 13.8 15.2* 13.6 12.3  HCT 40.8 45.8 40.0 37.4  MCV 90.5 90.2  --  90.1  PLT 317 293  --  [id number]    Basic Metabolic Panel: Recent Labs  Lab 06/09/21 0854 06/09/21 1456 06/09/21 1501 06/09/21 2354  NA 142  --  141 136  K 4.0  --  3.6 3.6  CL 107  --   --  105  CO2 28  --   --  21*  GLUCOSE 94  --   --  133*  BUN 12  --   --  11  CREATININE 0.88 0.74  --  0.58  CALCIUM 9.3  --   --  8.7*   GFR: Estimated Creatinine Clearance: 63 mL/min (by C-G formula based on  SCr of 0.58 mg/dL). Recent Labs  Lab 06/09/21 0854 06/09/21 1456 06/10/21 0559  WBC 5.4 7.4 6.2    Liver Function Tests: Recent Labs  Lab 06/09/21 0854  AST 20  ALT 11  ALKPHOS 76  BILITOT 1.1  PROT 7.3  ALBUMIN 3.8   No results for input(s): LIPASE, AMYLASE in the last 168 hours. No results for input(s): AMMONIA in the last 168 hours.  ABG    Component Value Date/Time   PHART 7.353 06/09/2021 1501   PCO2ART 46.2 06/09/2021 1501   PO2ART 129 (H) 06/09/2021 1501   HCO3 25.7 06/09/2021 1501   TCO2 27 06/09/2021 1501   O2SAT 99.0 06/09/2021 1501     Coagulation Profile: Recent Labs  Lab 06/09/21 0854  INR 1.0    Cardiac Enzymes: No results for input(s): CKTOTAL, CKMB, CKMBINDEX, TROPONINI in the last 168 hours.  HbA1C: HB A1C (BAYER DCA - WAIVED)  Date/Time Value Ref Range Status  01/05/2021 10:55 AM 5.4 <7.0 % Final    Comment:                                          Diabetic Adult            <7.0                                       Healthy Adult        4.3 - 5.7                                                           (DCCT/NGSP) American Diabetes Association's Summary of Glycemic Recommendations for Adults with Diabetes: Hemoglobin A1c <7.0%. More stringent glycemic goals (A1c <6.0%) may further reduce complications at the cost of increased risk of hypoglycemia.    Hgb A1c MFr Bld  Date/Time Value Ref Range Status  06/10/2021 05:59 AM 5.3 4.8 - 5.6 % Final    Comment:    (NOTE) Pre diabetes:          5.7%-6.4%  Diabetes:              >6.4%  Glycemic control for   <7.0% adults with diabetes   03/30/2021 05:15 AM 5.5 4.8 - 5.6 % Final    Comment:    (NOTE)         Prediabetes: 5.7 - 6.4         Diabetes: >6.4         Glycemic control for adults with diabetes: <7.0     CBG: Recent Labs  Lab 06/09/21 0852  GLUCAP 82    Total critical care time: 34 minutes  Performed by: Jacky Kindle   Critical care time was exclusive of  separately billable procedures and treating other patients.   Critical care was necessary to treat or prevent imminent or life-threatening deterioration.   Critical care was time spent personally by me on the following activities: development of treatment plan with patient and/or surrogate as well as nursing, discussions with consultants, evaluation of patient's response to treatment, examination of patient, obtaining history from patient or surrogate, ordering and performing treatments and interventions, ordering and review of laboratory studies, ordering and review of radiographic studies, pulse oximetry and re-evaluation of patient's condition.   Jacky Kindle MD [facility] See Amion for pager If no response to pager, please call [phone] until 7pm After 7pm, Please call E-link [phone]

## 2021-06-10 NOTE — Progress Notes (Signed)
  Subjective:  Patient ID: [name], female    DOB: 05-Sep-1941,  MRN: [id number]  Chief Complaint  Patient presents with   Nail Problem     np-routine foot and nail care-req day-dr. Park Liter refer    80 y.o. female presents with the above complaint. History confirmed with patient.  They have appointment at [facility] for her venous insufficiency.  The toenails are thick and painful and they are unable to cut them.  Objective:  Physical Exam: warm, good capillary refill, no trophic changes or ulcerative lesions, and venous stasis dermatitis noted. Left Foot: dystrophic yellowed discolored nail plates with subungual debris Right Foot: dystrophic yellowed discolored nail plates with subungual debris  Assessment:   1. Pain due to onychomycosis of toenails of both feet   2. Hammertoe of right foot   3. Hammertoe of left foot   4. Chronic venous insufficiency   5. Chronic anticoagulation      Plan:  Patient was evaluated and treated and all questions answered.  Discussed the etiology and treatment options for the condition in detail with the patient. Educated patient on the topical and oral treatment options for mycotic nails. Recommended debridement of the nails today. Sharp and mechanical debridement performed of all painful and mycotic nails today. Nails debrided in length and thickness using a nail nipper to level of comfort. Discussed treatment options including appropriate shoe gear. Follow up as needed for painful nails.    Return in about 3 months (around 09/06/2021) for RFC.

## 2021-06-10 NOTE — Progress Notes (Signed)
Notified CCM of patients continuously low HR high 30's-50.  Patient otherwise stable with a good BP.  No new orders at this time.  Will continue to monitor.

## 2021-06-10 NOTE — Progress Notes (Signed)
RT transported pt to MRI from 4n17 w/transport x1 and Rnx1 and back w/o complications. RT will cont to monitor.

## 2021-06-10 NOTE — Procedures (Signed)
Extubation Procedure Note  Patient Details:   Name: [name] DOB: [birth year] MRN: [id number]   Airway Documentation:    Vent end date: 06/10/21 Vent end time: 0759   Evaluation  O2 sats: stable throughout Complications: No apparent complications Patient did tolerate procedure well. Bilateral Breath Sounds: Clear, Diminished   Yes  Patient extubated per MD order. Positive cuff leak. No stridor noted. Vitals are stable on 3L . RN at bedside.  [name] H Kymoni Lesperance 06/10/2021, 7:59 AM

## 2021-06-10 NOTE — Progress Notes (Signed)
RLE pseudoaneurysm study has been completed.  Preliminary results given to Tyler Run, RN.

## 2021-06-10 NOTE — Progress Notes (Deleted)
MRN : [id number]  [name] is a 80 y.o. (December 05, 1940) female who presents with chief complaint of ***.  History of Present Illness: ***  No outpatient medications have been marked as taking for the 06/11/21 encounter (Appointment) with Delana Meyer, Dolores Lory, MD.    Past Medical History:  Diagnosis Date   Cat bite of right lower leg [id number]   Cellulitis of lower leg 12/07/2014   Hypertension     No past surgical history on file.  Social History Social History   Tobacco Use   Smoking status: Former    Types: Cigarettes    Quit date: 11/04/1978    Years since quitting: 42.6   Smokeless tobacco: Never  Vaping Use   Vaping Use: Never used  Substance Use Topics   Alcohol use: No   Drug use: No    Family History Family History  Problem Relation Age of Onset   Hypertension Mother    Alcohol abuse Father    Heart disease Father    Hypertension Father    Hyperlipidemia Sister    Hypertension Sister    Alcohol abuse Brother    Hypertension Brother     Allergies  Allergen Reactions   Amlodipine Other (See Comments)    Leg swelling   Haldol [Haloperidol Lactate] Other (See Comments)    Neuroleptic Malignant syndrome    Hctz [Hydrochlorothiazide] Nausea Only and Rash     REVIEW OF SYSTEMS (Negative unless checked)  Constitutional: '[]'$ Weight loss  '[]'$ Fever  '[]'$ Chills Cardiac: '[]'$ Chest pain   '[]'$ Chest pressure   '[]'$ Palpitations   '[]'$ Shortness of breath when laying flat   '[]'$ Shortness of breath with exertion. Vascular:  '[]'$ Pain in legs with walking   '[]'$ Pain in legs at rest  '[]'$ History of DVT   '[]'$ Phlebitis   '[x]'$ Swelling in legs   '[]'$ Varicose veins   '[]'$ Non-healing ulcers Pulmonary:   '[]'$ Uses home oxygen   '[]'$ Productive cough   '[]'$ Hemoptysis   '[]'$ Wheeze  '[]'$ COPD   '[]'$ Asthma Neurologic:  '[]'$ Dizziness   '[]'$ Seizures   '[]'$ History of stroke   '[]'$ History of TIA  '[]'$ Aphasia   '[]'$ Vissual changes   '[]'$ Weakness or numbness in arm   '[]'$ Weakness or numbness in leg Musculoskeletal:   '[]'$ Joint swelling    '[]'$ Joint pain   '[]'$ Low back pain Hematologic:  '[]'$ Easy bruising  '[]'$ Easy bleeding   '[]'$ Hypercoagulable state   '[]'$ Anemic Gastrointestinal:  '[]'$ Diarrhea   '[]'$ Vomiting  '[]'$ Gastroesophageal reflux/heartburn   '[]'$ Difficulty swallowing. Genitourinary:  '[]'$ Chronic kidney disease   '[]'$ Difficult urination  '[]'$ Frequent urination   '[]'$ Blood in urine Skin:  '[]'$ Rashes   '[]'$ Ulcers  Psychological:  '[]'$ History of anxiety   '[]'$  History of major depression.  Physical Examination  There were no vitals filed for this visit. There is no height or weight on file to calculate BMI. Gen: WD/WN, NAD Head: [place]/AT, No temporalis wasting.  Ear/Nose/Throat: Hearing grossly intact, nares w/o erythema or drainage, pinna without lesions Eyes: PER, EOMI, sclera nonicteric.  Neck: Supple, no gross masses.  No JVD.  Pulmonary:  Good air movement, no audible wheezing, no use of accessory muscles.  Cardiac: RRR, precordium not hyperdynamic. Vascular:  scattered varicosities present bilaterally.  Mild venous stasis changes to the legs bilaterally.  2+ soft pitting edema  Vessel Right Left  Radial Palpable Palpable  Gastrointestinal: soft, non-distended. No guarding/no peritoneal signs.  Musculoskeletal: M/S 5/5 throughout.  No deformity.  Neurologic: CN 2-12 intact. Pain and light touch intact in extremities.  Symmetrical.  Speech is fluent. Motor exam as listed above. Psychiatric:  Judgment intact, Mood & affect appropriate for pt's clinical situation. Dermatologic: Venous rashes no ulcers noted.  No changes consistent with cellulitis. Lymph : No lichenification or skin changes of chronic lymphedema.  CBC Lab Results  Component Value Date   WBC 6.2 06/10/2021   HGB 12.3 06/10/2021   HCT 37.4 06/10/2021   MCV 90.1 06/10/2021   PLT 314 06/10/2021    BMET    Component Value Date/Time   NA 136 06/09/2021 2354   NA 142 05/24/2021 1221   NA 137 11/16/2012 1747   K 3.6 06/09/2021 2354   K 3.5 11/16/2012 1747   CL 105 06/09/2021 2354    CL 104 11/16/2012 1747   CO2 21 (L) 06/09/2021 2354   CO2 26 11/16/2012 1747   GLUCOSE 133 (H) 06/09/2021 2354   GLUCOSE 101 (H) 11/16/2012 1747   BUN 11 06/09/2021 2354   BUN 12 05/24/2021 1221   BUN 11 11/16/2012 1747   CREATININE 0.58 06/09/2021 2354   CREATININE 0.55 (L) 11/16/2012 1747   CALCIUM 8.7 (L) 06/09/2021 2354   CALCIUM 9.4 11/16/2012 1747   GFRNONAA >60 06/09/2021 2354   GFRNONAA >60 11/16/2012 1747   GFRAA 83 08/30/2019 1338   GFRAA >60 11/16/2012 1747   Estimated Creatinine Clearance: 63 mL/min (by C-G formula based on SCr of 0.58 mg/dL).  COAG Lab Results  Component Value Date   INR 1.0 06/09/2021   INR 1.0 03/29/2021    Radiology MR BRAIN WO CONTRAST  Result Date: 06/10/2021 CLINICAL DATA:  Neuro deficit, acute, stroke suspected EXAM: MRI HEAD WITHOUT CONTRAST TECHNIQUE: Multiplanar, multiecho pulse sequences of the brain and surrounding structures were obtained without intravenous contrast. COMPARISON:  None. FINDINGS: Brain: Multifocal acute ischemia within the right internal capsule, left frontal white matter and left insula. Old left occipital infarct and old left frontal infarct. Bilateral confluent hyperintense T2-weighted signal in the white matter. Normal volume of CSF spaces. No chronic microhemorrhage. Normal midline structures. Vascular: Major flow voids are preserved. Skull and upper cervical spine: Normal calvarium and skull base. Visualized upper cervical spine and soft tissues are normal. Sinuses/Orbits:No paranasal sinus fluid levels or advanced mucosal thickening. No mastoid or middle ear effusion. Normal orbits. IMPRESSION: 1. Multifocal acute ischemia within the right internal capsule, left frontal white matter and left insula. No hemorrhage or mass effect. 2. 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 Electronically Signed   By: Ulyses Jarred M.D.   On: 06/10/2021 01:52   DG CHEST PORT 1 VIEW  Result Date: 06/09/2021 CLINICAL DATA:  ET tube  placement EXAM: PORTABLE CHEST 1 VIEW COMPARISON:  03/31/2021 FINDINGS: Endotracheal tube is 2 cm above the carina. NG tube enters the stomach. Cardiomegaly. Lungs clear. No effusions or edema. Aortic atherosclerosis. No acute bony abnormality. IMPRESSION: ET tube 2 cm above the carina. Cardiomegaly.  Aortic atherosclerosis. No active disease. Electronically Signed   By: Rolm Baptise M.D.   On: 06/09/2021 22:00   VAS [place] GROIN PSEUDOANEURYSM  Result Date: 06/10/2021  ARTERIAL PSEUDOANEURYSM  Patient Name:  [name]  Date of Exam:   06/10/2021 Medical Rec #: [id number]     Accession #:    [id number] Date of Birth: 1940-12-10      Patient Gender: F Patient Age:   84 years Exam Location:  [facility] Procedure:      VAS [place] Gloriajean Dell Referring Phys: Durenda Guthrie --------------------------------------------------------------------------------  Exam: Right groin Indications: Patient complains of "mild hardening" noted under RT CFA puncture  site per MD. History: S/p IR procedure. Comparison Study: no previous exams Performing Technologist: Jody Hill RVT, RDMS  Examination Guidelines: A complete evaluation includes B-mode imaging, spectral Doppler, color Doppler, and power Doppler as needed of all accessible portions of each vessel. Bilateral testing is considered an integral part of a complete examination. Limited examinations for reoccurring indications may be performed as noted. +------------+----------+--------+------+----------+ Right DuplexPSV (cm/s)WaveformPlaqueComment(s) +------------+----------+--------+------+----------+ CFA            139    biphasic                 +------------+----------+--------+------+----------+ PFA            132    biphasic                 +------------+----------+--------+------+----------+ Prox SFA       139    biphasic                 +------------+----------+--------+------+----------+  Summary: No evidence of pseudoaneurysm, AVF or DVT     --------------------------------------------------------------------------------    Preliminary    CT HEAD CODE STROKE WO CONTRAST  Result Date: 06/09/2021 CLINICAL DATA:  Code stroke. Right-sided weakness and slurred speech EXAM: CT HEAD WITHOUT CONTRAST TECHNIQUE: Contiguous axial images were obtained from the base of the skull through the vertex without intravenous contrast. COMPARISON:  03/30/2021 brain MRI FINDINGS: Brain: No evidence of acute infarction, hemorrhage, hydrocephalus, extra-axial collection or mass lesion/mass effect. Remote left occipital and lateral frontal cortex and white matter infarcts, moderate in size. Confluent chronic small vessel ischemia in the hemispheric white matter. No visible acute infarct. Stable sulcal calcification along the right frontal convexity. Vascular: Best seen on reformats is a branching hyperdensity in the left M2 region. CTA is pending. Atherosclerosis Skull: No acute finding Sinuses/Orbits: No acute finding Other: These results were communicated to Dr Quinn Axe at 9:09 am on 06/09/2021 by text page via the [facility] messaging system. IMPRESSION: 1. Hyperdense left M2 branch with no hemorrhage or visible acute infarct. 2. Chronic small vessel ischemia with remote left MCA and PCA branch infarcts. Electronically Signed   By: Monte Fantasia M.D.   On: 06/09/2021 09:10   CT ANGIO HEAD NECK W WO CM W PERF (CODE STROKE)  Addendum Date: 06/09/2021   ADDENDUM REPORT: 06/09/2021 09:32 ADDENDUM: Critical Value/emergent results were called by telephone at the time of interpretation on 06/09/2021 at 9:32 am to provider [facility] , who verbally acknowledged these results. Electronically Signed   By: Monte Fantasia M.D.   On: 06/09/2021 09:32   Result Date: 06/09/2021 CLINICAL DATA:  Stroke suspected. EXAM: CT ANGIOGRAPHY HEAD AND NECK CT PERFUSION BRAIN TECHNIQUE: Multidetector CT imaging of the head and neck was performed using the standard protocol during bolus administration  of intravenous contrast. Multiplanar CT image reconstructions and MIPs were obtained to evaluate the vascular anatomy. Carotid stenosis measurements (when applicable) are obtained utilizing NASCET criteria, using the distal internal carotid diameter as the denominator. Multiphase CT imaging of the brain was performed following IV bolus contrast injection. Subsequent parametric perfusion maps were calculated using RAPID software. CONTRAST:  Dose is not currently none on this in progress study. COMPARISON:  Head CT from earlier the same day FINDINGS: CTA NECK FINDINGS Aortic arch: Atheromatous plaque.  Three vessel branching. Right carotid system: Mild plaque at the bifurcation without stenosis or ulceration. Left carotid system: Mild calcified plaque at the bifurcation without stenosis or ulceration Vertebral arteries: No proximal subclavian atherosclerosis. Codominant vertebral  arteries that are tortuous but widely patent to the dura. Skeleton: Advanced cervical spine degeneration with particularly severe right-sided C1-2 facet degeneration with bulky spurring impinging on the right C2 nerve root. C2-3 and C3-4 facet ankylosis. Other neck: No acute finding Upper chest: No acute finding Review of the MIP images confirms the above findings CTA HEAD FINDINGS Anterior circulation: Atheromatous plaque at the bilateral carotid siphon. Distal left M2 branch cut off with some downstream reconstitution. No additional branch occlusion is seen. Negative for aneurysm Posterior circulation: Vertebral and basilar arteries are smoothly contoured and widely patent. No branch occlusion, beading, or aneurysm. Venous sinuses: Diffusely patent Anatomic variants: None significant Review of the MIP images confirms the above findings CT Brain Perfusion Findings: ASPECTS: 10 when accounting for chronic changes CBF (<30%) Volume: 40m Perfusion (Tmax>6.0s) volume: 510mMismatch Volume: 5152m page and call has been placed to neurology.  IMPRESSION: 1. Emergent left M2 branch occlusion with penumbra pattern on CT perfusion involving a 51 cc area. 2. Mild for age atherosclerosis without flow limiting stenosis or visible embolic source. Electronically Signed: By: JonMonte FantasiaD. On: 06/09/2021 09:28     Assessment/Plan There are no diagnoses linked to this encounter.   GreHortencia PilarD  06/10/2021 1:57 PM

## 2021-06-10 NOTE — Progress Notes (Signed)
Inpatient Rehab Admissions Coordinator Note:   Per therapy recommendations, pt was screened for CIR candidacy by Tyra Gural, MS CCC-SLP. At this time, Pt. Appears to have functional decline and is a good candidate for CIR. Will place order for rehab consult per protocol.  Please contact me with questions.   Jolissa Kapral, MS, CCC-SLP Rehab Admissions Coordinator  [phone] (celll) [phone] (office)  

## 2021-06-10 NOTE — Progress Notes (Signed)
STROKE TEAM PROGRESS NOTE   SUBJECTIVE (INTERVAL HISTORY) Her daughter-in-law and son are at the bedside.  Overall her condition is gradually improving. Pt was extubated this am, tolerating well. However, pt still mildly restless, on posey belt restrain. Per family, pt has baseline dementia, she has yet to see a neurologist but appointment has been postponed for several times. She was able to walk with walker at home, but needs 24/7 supervision. She needs help for bathing, but able to eat and dress by herself but slow and sometimes needs help. She has sundowning at home also.    OBJECTIVE Temp:  [97 F (36.1 C)-97.9 F (36.6 C)] 97 F (36.1 C) (08/07 0800) Pulse Rate:  [36-85] 64 (08/07 1000) Resp:  [16-27] 24 (08/07 1000) BP: (84-162)/(48-110) 139/71 (08/07 1000) SpO2:  [84 %-100 %] 100 % (08/07 1000) Arterial Line BP: (69-176)/(34-80) 163/74 (08/07 0800) FiO2 (%):  [40 %-100 %] 40 % (08/07 0720)  Recent Labs  Lab 06/09/21 0852  GLUCAP 82   Recent Labs  Lab 06/09/21 0854 06/09/21 1456 06/09/21 1501 06/09/21 2354  NA 142  --  141 136  K 4.0  --  3.6 3.6  CL 107  --   --  105  CO2 28  --   --  21*  GLUCOSE 94  --   --  133*  BUN 12  --   --  11  CREATININE 0.88 0.74  --  0.58  CALCIUM 9.3  --   --  8.7*   Recent Labs  Lab 06/09/21 0854  AST 20  ALT 11  ALKPHOS 76  BILITOT 1.1  PROT 7.3  ALBUMIN 3.8   Recent Labs  Lab 06/09/21 0854 06/09/21 1456 06/09/21 1501 06/10/21 0559  WBC 5.4 7.4  --  6.2  NEUTROABS 3.2  --   --  5.0  HGB 13.8 15.2* 13.6 12.3  HCT 40.8 45.8 40.0 37.4  MCV 90.5 90.2  --  90.1  PLT 317 293  --  314   No results for input(s): CKTOTAL, CKMB, CKMBINDEX, TROPONINI in the last 168 hours. Recent Labs    06/09/21 0854  LABPROT 13.6  INR 1.0   No results for input(s): COLORURINE, LABSPEC, PHURINE, GLUCOSEU, HGBUR, BILIRUBINUR, KETONESUR, PROTEINUR, UROBILINOGEN, NITRITE, LEUKOCYTESUR in the last 72 hours.  Invalid input(s): APPERANCEUR      Component Value Date/Time   CHOL 86 06/10/2021 0559   CHOL 168 01/05/2021 1056   TRIG 49 06/10/2021 0600   HDL 48 06/10/2021 0559   HDL 72 01/05/2021 1056   CHOLHDL 1.8 06/10/2021 0559   VLDL 8 06/10/2021 0559   LDLCALC 30 06/10/2021 0559   LDLCALC 80 01/05/2021 1056   Lab Results  Component Value Date   HGBA1C 5.3 06/10/2021   No results found for: LABOPIA, COCAINSCRNUR, LABBENZ, AMPHETMU, THCU, LABBARB  No results for input(s): ETH in the last 168 hours.  I have personally reviewed the radiological images below and agree with the radiology interpretations.  MR BRAIN WO CONTRAST  Result Date: 06/10/2021 CLINICAL DATA:  Neuro deficit, acute, stroke suspected EXAM: MRI HEAD WITHOUT CONTRAST TECHNIQUE: Multiplanar, multiecho pulse sequences of the brain and surrounding structures were obtained without intravenous contrast. COMPARISON:  None. FINDINGS: Brain: Multifocal acute ischemia within the right internal capsule, left frontal white matter and left insula. Old left occipital infarct and old left frontal infarct. Bilateral confluent hyperintense T2-weighted signal in the white matter. Normal volume of CSF spaces. No chronic microhemorrhage. Normal midline structures.  Vascular: Major flow voids are preserved. Skull and upper cervical spine: Normal calvarium and skull base. Visualized upper cervical spine and soft tissues are normal. Sinuses/Orbits:No paranasal sinus fluid levels or advanced mucosal thickening. No mastoid or middle ear effusion. Normal orbits. IMPRESSION: 1. Multifocal acute ischemia within the right internal capsule, left frontal white matter and left insula. No hemorrhage or mass effect. 2. 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 Electronically Signed   By: Ulyses Jarred M.D.   On: 06/10/2021 01:52   DG CHEST PORT 1 VIEW  Result Date: 06/09/2021 CLINICAL DATA:  ET tube placement EXAM: PORTABLE CHEST 1 VIEW COMPARISON:  03/31/2021 FINDINGS: Endotracheal tube is 2  cm above the carina. NG tube enters the stomach. Cardiomegaly. Lungs clear. No effusions or edema. Aortic atherosclerosis. No acute bony abnormality. IMPRESSION: ET tube 2 cm above the carina. Cardiomegaly.  Aortic atherosclerosis. No active disease. Electronically Signed   By: Rolm Baptise M.D.   On: 06/09/2021 22:00   CT HEAD CODE STROKE WO CONTRAST  Result Date: 06/09/2021 CLINICAL DATA:  Code stroke. Right-sided weakness and slurred speech EXAM: CT HEAD WITHOUT CONTRAST TECHNIQUE: Contiguous axial images were obtained from the base of the skull through the vertex without intravenous contrast. COMPARISON:  03/30/2021 brain MRI FINDINGS: Brain: No evidence of acute infarction, hemorrhage, hydrocephalus, extra-axial collection or mass lesion/mass effect. Remote left occipital and lateral frontal cortex and white matter infarcts, moderate in size. Confluent chronic small vessel ischemia in the hemispheric white matter. No visible acute infarct. Stable sulcal calcification along the right frontal convexity. Vascular: Best seen on reformats is a branching hyperdensity in the left M2 region. CTA is pending. Atherosclerosis Skull: No acute finding Sinuses/Orbits: No acute finding Other: These results were communicated to Dr Quinn Axe at 9:09 am on 06/09/2021 by text page via the [facility] messaging system. IMPRESSION: 1. Hyperdense left M2 branch with no hemorrhage or visible acute infarct. 2. Chronic small vessel ischemia with remote left MCA and PCA branch infarcts. Electronically Signed   By: Monte Fantasia M.D.   On: 06/09/2021 09:10   CT ANGIO HEAD NECK W WO CM W PERF (CODE STROKE)  Addendum Date: 06/09/2021   ADDENDUM REPORT: 06/09/2021 09:32 ADDENDUM: Critical Value/emergent results were called by telephone at the time of interpretation on 06/09/2021 at 9:32 am to provider [facility] , who verbally acknowledged these results. Electronically Signed   By: Monte Fantasia M.D.   On: 06/09/2021 09:32   Result Date:  06/09/2021 CLINICAL DATA:  Stroke suspected. EXAM: CT ANGIOGRAPHY HEAD AND NECK CT PERFUSION BRAIN TECHNIQUE: Multidetector CT imaging of the head and neck was performed using the standard protocol during bolus administration of intravenous contrast. Multiplanar CT image reconstructions and MIPs were obtained to evaluate the vascular anatomy. Carotid stenosis measurements (when applicable) are obtained utilizing NASCET criteria, using the distal internal carotid diameter as the denominator. Multiphase CT imaging of the brain was performed following IV bolus contrast injection. Subsequent parametric perfusion maps were calculated using RAPID software. CONTRAST:  Dose is not currently none on this in progress study. COMPARISON:  Head CT from earlier the same day FINDINGS: CTA NECK FINDINGS Aortic arch: Atheromatous plaque.  Three vessel branching. Right carotid system: Mild plaque at the bifurcation without stenosis or ulceration. Left carotid system: Mild calcified plaque at the bifurcation without stenosis or ulceration Vertebral arteries: No proximal subclavian atherosclerosis. Codominant vertebral arteries that are tortuous but widely patent to the dura. Skeleton: Advanced cervical spine degeneration with particularly severe  right-sided C1-2 facet degeneration with bulky spurring impinging on the right C2 nerve root. C2-3 and C3-4 facet ankylosis. Other neck: No acute finding Upper chest: No acute finding Review of the MIP images confirms the above findings CTA HEAD FINDINGS Anterior circulation: Atheromatous plaque at the bilateral carotid siphon. Distal left M2 branch cut off with some downstream reconstitution. No additional branch occlusion is seen. Negative for aneurysm Posterior circulation: Vertebral and basilar arteries are smoothly contoured and widely patent. No branch occlusion, beading, or aneurysm. Venous sinuses: Diffusely patent Anatomic variants: None significant Review of the MIP images confirms  the above findings CT Brain Perfusion Findings: ASPECTS: 10 when accounting for chronic changes CBF (<30%) Volume: 82m Perfusion (Tmax>6.0s) volume: 530mMismatch Volume: 5123m page and call has been placed to neurology. IMPRESSION: 1. Emergent left M2 branch occlusion with penumbra pattern on CT perfusion involving a 51 cc area. 2. Mild for age atherosclerosis without flow limiting stenosis or visible embolic source. Electronically Signed: By: JonMonte FantasiaD. On: 06/09/2021 09:28     PHYSICAL EXAM  Temp:  [97 F (36.1 C)-97.9 F (36.6 C)] 97 F (36.1 C) (08/07 0800) Pulse Rate:  [36-85] 64 (08/07 1000) Resp:  [16-27] 24 (08/07 1000) BP: (84-162)/(48-110) 139/71 (08/07 1000) SpO2:  [84 %-100 %] 100 % (08/07 1000) Arterial Line BP: (69-176)/(34-80) 163/74 (08/07 0800) FiO2 (%):  [40 %-100 %] 40 % (08/07 0720)  General - Well nourished, well developed, in no apparent distress. However mildly restless  Ophthalmologic - fundi not visualized due to noncooperation.  Cardiovascular - irregularly irregular heart rate and rhythm.  Neuro - awake, alert, eyes open, not answer orientation questions appropriately, but able to say her name and named his son who is not here today. However, seems to have global aphasia, with word salad and not following commands but able to mimic hand gestures. No gaze palsy, tracking bilaterally, not consistently blinking to visual threat bilaterally, PERRL. No facial droop. Tongue protrusion not cooperative. Bilateral UEs and LEs spontaneous movement, equal bilaterally on observation, Sensation, coordination and gait not tested.   ASSESSMENT/PLAN Ms. MarGurpreet Granadosnor is a 80 26o. female with history of afib off Xarelto due to recent hematuria, HTN, dementia, stroke in 03/2021, NMS with haldol admitted for aphasia and right sided weakness. No tPA given due to recent hematuria and stroke within 3 months.    Stroke:  right IC, left insular cortex and left frontal  infarcts, embolic pattern, secondary to AFib off Xarelto due to hematuria CT head no acute finding CTA head and neck left M2 occlusion CTP 0/51, positive for penumbra MRI Right IC, left insular cortex and left frontal infarcts 2D Echo  pending LDL 30 HgbA1c 5.3 lovenox for VTE prophylaxis Xarelto (rivaroxaban) daily (off due to hematuria) prior to admission, now on aspirin 81 mg daily. Will resume Xarelto once passed po given small infarcts Patient family counseled to be compliant with her antithrombotic medications Ongoing aggressive stroke risk factor management Therapy recommendations:  pending Disposition:  pending   Hematuria  Per family, only lasted 2 days with dark urine PCP put Xarelto on hold until sees urologist Urologist saw her last week, put on Cipro for 3 days and recommend to resume Xarelto after cipro course finished Pt about to start Xarelto today per original schedule Now on ASA 81, will resume Xarelto today if passed swallow.  Hx of stroke 03/2021 left MCA scattered punctate infarcts, MRA and CUS neg, EF 60-65%, LDL 73, A1C 5.5. stroke  etiology was due to noncompliance of Xarelto which was resumed on discharge.  Developed NMS after haldol use during 03/2021 admission. Had ICU monitoring, ativan PRN and no dantrolene needed.   Afib Off home Xarelto due to recent hematuria Urologist plan to start Xarelto today On ASA 81 now Will resume Xarelto today if pass swallow  Dementia  No official diagnosis yet due to frequent cancellation/rescheduling of neurology appointment Currently pending appointment in 07/2021 Cognitive impairment at home with sundowning, needing 24/7 supervision. Needs help for bathing, but able to eat and dress by herself but slow and sometimes needs help. Now on posey restrain, mildly restless Will need seroquel once pass swallow  Hypertension Stable On home propranolol Long term BP goal normotensive  Hyperlipidemia Home meds:  lipitor 40   LDL 30, goal < 70 Will continue lipitor once po access Continue statin at discharge  Other Stroke Risk Factors Advanced age  Other [facility] day # 1  This patient is critically ill due to stroke s/p thrombectomy, AFib, dementia, recent hematuria and at significant risk of neurological worsening, death form heart failure, recurrent stroke, hemorrhagic conversion, seizure. This patient's care requires constant monitoring of vital signs, hemodynamics, respiratory and cardiac monitoring, review of multiple databases, neurological assessment, discussion with family, other specialists and medical decision making of high complexity. I spent 45 minutes of neurocritical care time in the care of this patient. I had long discussion with son and daughter in law at bedside, updated pt current condition, treatment plan and potential prognosis, and answered all the questions. They expressed understanding and appreciation.    Rosalin Hawking, MD PhD Stroke Neurology 06/10/2021 10:43 AM    To contact Stroke Continuity provider, please refer to [email or web address] After hours, contact General Neurology

## 2021-06-10 NOTE — Evaluation (Signed)
Physical Therapy Evaluation Patient Details Name: [name] MRN: [id number] DOB: [birth year] Today's Date: 06/10/2021   History of Present Illness  This 80 y.o. female admitted with acute onset Rt sided weakness and aphasia.  CTA showed acute L M2 oacclusion > thrombectomy.  MRI showed:  Multifocal acute ischemia within the right internal capsule, left frontal white matter and left insula. 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  PMH includes:  cat bite with cellulitis Lt Lower leg, HTN, h/o CVAs, mild dementia  Clinical Impression  Pt admitted with/for s/s of stroke found in the right internal capsule, left frontal white matter and left insula.  Pt needing moderate cuing for basic mobility/gait, but minimal assist overall..  Pt currently limited functionally due to the problems listed. ( See problems list.)   Pt will benefit from PT to maximize function and safety in order to get ready for next venue listed below.     Follow Up Recommendations CIR;Supervision/Assistance - 24 hour    Equipment Recommendations  Other (comment) (TBA)    Recommendations for Other Services Rehab consult     Precautions / Restrictions Precautions Precautions: Fall Precaution Comments: pt with dementia and aphasia      Mobility  Bed Mobility Overal bed mobility: Needs Assistance Bed Mobility: Supine to Sit;Sit to Supine     Supine to sit: Min assist Sit to supine: Min assist   General bed mobility comments: assist to initiate movement, to lift trunk from bed and to lift LEs onto bed    Transfers Overall transfer level: Needs assistance Equipment used: Rolling walker (2 wheeled) Transfers: Sit to/from [facility] Sit to Stand: Min assist;+2 safety/equipment Stand pivot transfers: Min assist;+2 safety/equipment       General transfer comment: assist to steady and cues for walker use and safety  Ambulation/Gait Ambulation/Gait assistance: Min assist Gait Distance  (Feet): 140 Feet Assistive device: Rolling walker (2 wheeled) Gait Pattern/deviations: Step-through pattern Gait velocity: slower Gait velocity interpretation: <1.8 ft/sec, indicate of risk for recurrent falls General Gait Details: cues for RW, direction/redirection. stability and AD assist.  Stairs            Wheelchair Mobility    Modified Rankin (Stroke Patients Only) Modified Rankin (Stroke Patients Only) Pre-Morbid Rankin Score: No symptoms Modified Rankin: Moderate disability     Balance Overall balance assessment: Needs assistance Sitting-balance support: Feet supported Sitting balance-[name]: Fair     Standing balance support: During functional activity;No upper extremity supported Standing balance-[name]: Fair                               Pertinent Vitals/Pain Pain Assessment: Faces Pain Score: 0-No pain Faces Pain Scale: No hurt    Home Living Family/patient expects to be discharged to:: Private residence   Available Help at Discharge: Family Type of Home: Mobile home Home Access: Stairs to enter Entrance Stairs-Rails: Psychiatric nurse of Steps: 4 Home Layout: One level Home Equipment: Walker - 4 wheels;Shower seat Additional Comments: Daughter in law is primary caregiver.  She works during the day and has a family friend who stays with pt during the day.    Prior Function Level of Independence: Needs assistance   Gait / Transfers Assistance Needed: Pt ambulates with RW in the house.  She has been working with [employer].  She requires cues to use the RW  ADL's / Homemaking Assistance Needed: Pt was receiving HHOT.  Pt was able to self feed and go to the BR mod I.  She requires assist with bathing and dressing, but is able to help with these tasks        Hand Dominance   Dominant Hand: Right    Extremity/Trunk Assessment   Upper Extremity Assessment Upper Extremity Assessment: Overall WFL for tasks  assessed    Lower Extremity Assessment Lower Extremity Assessment: Overall WFL for tasks assessed;Generalized weakness    Cervical / Trunk Assessment Cervical / Trunk Assessment: Kyphotic  Communication   Communication: Expressive difficulties;Receptive difficulties  Cognition Arousal/Alertness: Awake/alert Behavior During Therapy: WFL for tasks assessed/performed Overall Cognitive Status: Difficult to assess                                 General Comments: Pt able to follow simple commands with cues and gestures.  Pt very pleasant      General Comments General comments (skin integrity, edema, etc.): pt's daughter in law and son present during eval    Exercises     Assessment/Plan    PT Assessment Patient needs continued PT services  PT Problem List Decreased strength;Decreased activity tolerance;Decreased mobility;Decreased balance;Decreased cognition;Decreased knowledge of use of DME;Decreased safety awareness       PT Treatment Interventions DME instruction;Gait training;Stair training;Functional mobility training;Therapeutic activities;Balance training;Neuromuscular re-education;Cognitive remediation;Patient/family education    PT Goals (Current goals can be found in the Care Plan section)  Acute Rehab PT Goals Patient Stated Goal: for her to return to baseline PT Goal Formulation: Patient unable to participate in goal setting Time For Goal Achievement: 06/24/21 Potential to Achieve Goals: Good    Frequency Min 3X/week   Barriers to discharge        Co-evaluation PT/OT/SLP Co-Evaluation/Treatment: Yes Reason for Co-Treatment: For patient/therapist safety PT goals addressed during session: Mobility/safety with mobility OT goals addressed during session: ADL's and self-care       AM-PAC PT "6 Clicks" Mobility  Outcome Measure Help needed turning from your back to your side while in a flat bed without using bedrails?: A Little Help needed  moving from lying on your back to sitting on the side of a flat bed without using bedrails?: A Little Help needed moving to and from a bed to a chair (including a wheelchair)?: A Little Help needed standing up from a chair using your arms (e.g., wheelchair or bedside chair)?: A Little Help needed to walk in hospital room?: A Little Help needed climbing 3-5 steps with a railing? : A Lot 6 Click Score: 17    End of Session   Activity Tolerance: Patient tolerated treatment well Patient left: in bed;with call bell/phone within reach;with bed alarm set;with family/visitor present Nurse Communication: Mobility status PT Visit Diagnosis: Unsteadiness on feet (R26.81);Other abnormalities of gait and mobility (R26.89);Difficulty in walking, not elsewhere classified (R26.2);Other symptoms and signs involving the nervous system (R29.898)    Time: [id number] PT Time Calculation (min) (ACUTE ONLY): 51 min   Charges:   PT Evaluation $PT Eval Moderate Complexity: 1 Mod          06/10/2021  Ginger Carne., PT Acute Rehabilitation Services [phone]  (pager) [phone]  (office)  Tessie Fass [facility] 06/10/2021, 3:42 PM

## 2021-06-10 NOTE — Progress Notes (Signed)
Referring Physician(s): Stroke   Supervising Physician: Luanne Bras  Patient Status:  [facility] - In-pt  Chief Complaint:  Code stroke S/p Lt common carotid arteriogram followed by complete revascularization of occluded Lt MCA inf division achieving a TICI 3 revascularizarion with Dr. Estanislado Pandy on 06/09/2021  Subjective:  Patient laying in bed, not in acute distress.  Nurse and family members at the bedside. RN states that patient was extubated this morning, she tolerated the procedure well and the strength in her extremities very good. Bilateral DP were dopplerable per RN.  Allergies: Amlodipine, Haldol [haloperidol lactate], and Hctz [hydrochlorothiazide]  Medications: Prior to Admission medications   Medication Sig Start Date End Date Taking? Authorizing Provider  atorvastatin (LIPITOR) 40 MG tablet Take 1 tablet (40 mg total) by mouth at bedtime. 05/25/21  Yes Johnson, Megan P, DO  ciprofloxacin (CIPRO) 500 MG tablet Take 1 tablet (500 mg total) by mouth every 12 (twelve) hours. 06/06/21  Yes Billey Co, MD  diclofenac Sodium (VOLTAREN) 1 % GEL Apply 4 g topically 4 (four) times daily. Patient taking differently: Apply 4 g topically 4 (four) times daily as needed (left knee pain). 05/24/21  Yes Johnson, Megan P, DO  hydrALAZINE (APRESOLINE) 10 MG tablet Take 20 mg by mouth 2 (two) times daily.   Yes [provider]  nystatin (MYCOSTATIN) 100000 UNIT/ML suspension Take 5 mLs (500,000 Units total) by mouth 4 (four) times daily. Patient taking differently: Take 5 mLs by mouth 4 (four) times daily as needed (thrush). 04/11/21  Yes Danford, Suann Larry, MD  propranolol (INDERAL) 10 MG tablet Take 1 tablet (10 mg total) by mouth 2 (two) times daily. Patient taking differently: Take 10 mg by mouth See admin instructions. Take one tablet (10 mg) by mouth twice daily unless heart rate <60 05/24/21  Yes Johnson, Megan P, DO  Dextromethorphan-quiNIDine (NUEDEXTA) 20-10 MG  capsule Take 1 capsule by mouth at bedtime. Patient not taking: No sig reported 05/24/21   Park Liter P, DO  hydrALAZINE (APRESOLINE) 25 MG tablet Take 1 tablet (25 mg total) by mouth 2 (two) times daily. Patient not taking: No sig reported 06/05/21   Park Liter P, DO  rivaroxaban (XARELTO) 20 MG TABS tablet TAKE 1 TABLET BY MOUTH EVERY DAY WITH DINNER Patient taking differently: Take 20 mg by mouth daily with supper. 05/24/21   Park Liter P, DO     Vital Signs: BP 110/74   Pulse (!) 41   Temp (!) 97 F (36.1 C) (Axillary)   Resp 16   SpO2 96%   Physical Exam Vitals reviewed.  Constitutional:      General: She is not in acute distress.    Appearance: She is not ill-appearing.  HENT:     Head: Normocephalic and atraumatic.  Pulmonary:     Effort: Pulmonary effort is normal.  Skin:    General: Skin is warm and dry.     Comments: Positive dressing on R CFA puncture site. Site with minimal hardening underneath the puncture site, mild ecchymosis noted. Site is otherwise unremarkable with no erythema, edema, active bleeding or drainage. Minimal amount of old, dry blood note son the dressing. Dressing otherwise clean, dry, and intact.    Neurological:     Mental Status: She is disoriented.     Comments: Patient is oriented to self only, continues to state her name repetitively. Patient was asked by her family member " tell her your name," and she was able to tell her name  on command. Not able to follow command otherwise. Able to move upper and lower extremity spontaneously, no purposeful movement. Strength in all 4 extremities good.  Bilateral DP dopplerable per RN.    Imaging: MR BRAIN WO CONTRAST  Result Date: 06/10/2021 CLINICAL DATA:  Neuro deficit, acute, stroke suspected EXAM: MRI HEAD WITHOUT CONTRAST TECHNIQUE: Multiplanar, multiecho pulse sequences of the brain and surrounding structures were obtained without intravenous contrast. COMPARISON:  None. FINDINGS:  Brain: Multifocal acute ischemia within the right internal capsule, left frontal white matter and left insula. Old left occipital infarct and old left frontal infarct. Bilateral confluent hyperintense T2-weighted signal in the white matter. Normal volume of CSF spaces. No chronic microhemorrhage. Normal midline structures. Vascular: Major flow voids are preserved. Skull and upper cervical spine: Normal calvarium and skull base. Visualized upper cervical spine and soft tissues are normal. Sinuses/Orbits:No paranasal sinus fluid levels or advanced mucosal thickening. No mastoid or middle ear effusion. Normal orbits. IMPRESSION: 1. Multifocal acute ischemia within the right internal capsule, left frontal white matter and left insula. No hemorrhage or mass effect. 2. 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 Electronically Signed   By: Ulyses Jarred M.D.   On: 06/10/2021 01:52   DG CHEST PORT 1 VIEW  Result Date: 06/09/2021 CLINICAL DATA:  ET tube placement EXAM: PORTABLE CHEST 1 VIEW COMPARISON:  03/31/2021 FINDINGS: Endotracheal tube is 2 cm above the carina. NG tube enters the stomach. Cardiomegaly. Lungs clear. No effusions or edema. Aortic atherosclerosis. No acute bony abnormality. IMPRESSION: ET tube 2 cm above the carina. Cardiomegaly.  Aortic atherosclerosis. No active disease. Electronically Signed   By: Rolm Baptise M.D.   On: 06/09/2021 22:00   CT HEAD CODE STROKE WO CONTRAST  Result Date: 06/09/2021 CLINICAL DATA:  Code stroke. Right-sided weakness and slurred speech EXAM: CT HEAD WITHOUT CONTRAST TECHNIQUE: Contiguous axial images were obtained from the base of the skull through the vertex without intravenous contrast. COMPARISON:  03/30/2021 brain MRI FINDINGS: Brain: No evidence of acute infarction, hemorrhage, hydrocephalus, extra-axial collection or mass lesion/mass effect. Remote left occipital and lateral frontal cortex and white matter infarcts, moderate in size. Confluent chronic small  vessel ischemia in the hemispheric white matter. No visible acute infarct. Stable sulcal calcification along the right frontal convexity. Vascular: Best seen on reformats is a branching hyperdensity in the left M2 region. CTA is pending. Atherosclerosis Skull: No acute finding Sinuses/Orbits: No acute finding Other: These results were communicated to Dr Quinn Axe at 9:09 am on 06/09/2021 by text page via the [facility] messaging system. IMPRESSION: 1. Hyperdense left M2 branch with no hemorrhage or visible acute infarct. 2. Chronic small vessel ischemia with remote left MCA and PCA branch infarcts. Electronically Signed   By: Monte Fantasia M.D.   On: 06/09/2021 09:10   CT ANGIO HEAD NECK W WO CM W PERF (CODE STROKE)  Addendum Date: 06/09/2021   ADDENDUM REPORT: 06/09/2021 09:32 ADDENDUM: Critical Value/emergent results were called by telephone at the time of interpretation on 06/09/2021 at 9:32 am to provider [facility] , who verbally acknowledged these results. Electronically Signed   By: Monte Fantasia M.D.   On: 06/09/2021 09:32   Result Date: 06/09/2021 CLINICAL DATA:  Stroke suspected. EXAM: CT ANGIOGRAPHY HEAD AND NECK CT PERFUSION BRAIN TECHNIQUE: Multidetector CT imaging of the head and neck was performed using the standard protocol during bolus administration of intravenous contrast. Multiplanar CT image reconstructions and MIPs were obtained to evaluate the vascular anatomy. Carotid stenosis measurements (when  applicable) are obtained utilizing NASCET criteria, using the distal internal carotid diameter as the denominator. Multiphase CT imaging of the brain was performed following IV bolus contrast injection. Subsequent parametric perfusion maps were calculated using RAPID software. CONTRAST:  Dose is not currently none on this in progress study. COMPARISON:  Head CT from earlier the same day FINDINGS: CTA NECK FINDINGS Aortic arch: Atheromatous plaque.  Three vessel branching. Right carotid system: Mild  plaque at the bifurcation without stenosis or ulceration. Left carotid system: Mild calcified plaque at the bifurcation without stenosis or ulceration Vertebral arteries: No proximal subclavian atherosclerosis. Codominant vertebral arteries that are tortuous but widely patent to the dura. Skeleton: Advanced cervical spine degeneration with particularly severe right-sided C1-2 facet degeneration with bulky spurring impinging on the right C2 nerve root. C2-3 and C3-4 facet ankylosis. Other neck: No acute finding Upper chest: No acute finding Review of the MIP images confirms the above findings CTA HEAD FINDINGS Anterior circulation: Atheromatous plaque at the bilateral carotid siphon. Distal left M2 branch cut off with some downstream reconstitution. No additional branch occlusion is seen. Negative for aneurysm Posterior circulation: Vertebral and basilar arteries are smoothly contoured and widely patent. No branch occlusion, beading, or aneurysm. Venous sinuses: Diffusely patent Anatomic variants: None significant Review of the MIP images confirms the above findings CT Brain Perfusion Findings: ASPECTS: 10 when accounting for chronic changes CBF (<30%) Volume: 30m Perfusion (Tmax>6.0s) volume: 525mMismatch Volume: 5148m page and call has been placed to neurology. IMPRESSION: 1. Emergent left M2 branch occlusion with penumbra pattern on CT perfusion involving a 51 cc area. 2. Mild for age atherosclerosis without flow limiting stenosis or visible embolic source. Electronically Signed: By: JonMonte FantasiaD. On: 06/09/2021 09:28    Labs:  CBC: Recent Labs    05/24/21 1221 06/09/21 0854 06/09/21 1456 06/09/21 1501 06/10/21 0559  WBC 7.2 5.4 7.4  --  6.2  HGB 13.9 13.8 15.2* 13.6 12.3  HCT 43.6 40.8 45.8 40.0 37.4  PLT 348 317 293  --  314    COAGS: Recent Labs    03/29/21 1159 06/09/21 0854  INR 1.0 1.0  APTT 24 30    BMP: Recent Labs    04/08/21 1523 05/24/21 1221 06/09/21 0854  06/09/21 1456 06/09/21 1501 06/09/21 2354  NA 139 142 142  --  141 136  K 3.8 4.9 4.0  --  3.6 3.6  CL 104 101 107  --   --  105  CO2 '27 23 28  '$ --   --  21*  GLUCOSE 102* 71 94  --   --  133*  BUN '16 12 12  '$ --   --  11  CALCIUM 8.6* 9.3 9.3  --   --  8.7*  CREATININE 0.62 0.81 0.88 0.74  --  0.58  GFRNONAA >60  --  >60 >60  --  >60    LIVER FUNCTION TESTS: Recent Labs    03/31/21 1305 04/01/21 0413 05/24/21 1221 06/09/21 0854  BILITOT 1.5* 1.4* 0.8 1.1  AST '24 24 17 20  '$ ALT '11 11 7 11  '$ ALKPHOS 62 58 102 76  PROT 7.1 6.7 6.8 7.3  ALBUMIN 3.5 3.1* 4.3 3.8    Assessment and Plan:  Code stroke S/p Lt common carotid arteriogram followed by complete revascularization of occluded Lt MCA inf division achieving a TICI 3 revascularizarion with Dr. DevEstanislado Pandy 06/09/2021  Patient remained intubated after the procedure due to pending COVID result, underwent extubation this  morning and tolerated well.   Patient is oriented to self only, continues to state her name repetitively. Patient was asked by her family member " tell her your name," and she was able to tell her name on command. Not able to follow command otherwise. Able to move upper and lower extremity spontaneously, no purposeful movement. Strength in all 4 extremities good.  Bilateral DP dopplerable per RN.    Mild hardening noted under the right CFA puncture site, unclear if the site is tender to palpation as patient is currently oriented to herself only.   VAS [place] ordered to rule out pseudoaneurysm on the right CFA puncture site.  Further treatment plan per Stroke team Appreciate and agree with the plan.  NIR to follow.   Please contact NIR PA Black at [phone], available until  2 pm on Sunday.  After 2 PM, please contact Dr. Estanislado Pandy for further question and concerns.  Electronically Signed: Tera Mater, PA-C 06/10/2021, 8:23 AM   I spent a total of 25 Minutes at the the patient's bedside AND on the  patient's hospital floor or unit, greater than 50% of which was counseling/coordinating care for code stroke s/p Lt common carotid arteriogram followed by complete revascularization of occluded Lt MCA inf division achieving a TICI 3 revascularizarion

## 2021-06-11 ENCOUNTER — Encounter (INDEPENDENT_AMBULATORY_CARE_PROVIDER_SITE_OTHER): Payer: Medicare HMO | Admitting: Vascular Surgery

## 2021-06-11 ENCOUNTER — Encounter (HOSPITAL_COMMUNITY): Payer: Self-pay | Admitting: Radiology

## 2021-06-11 DIAGNOSIS — M8949 Other hypertrophic osteoarthropathy, multiple sites: Secondary | ICD-10-CM

## 2021-06-11 DIAGNOSIS — R410 Disorientation, unspecified: Secondary | ICD-10-CM

## 2021-06-11 DIAGNOSIS — I6602 Occlusion and stenosis of left middle cerebral artery: Secondary | ICD-10-CM | POA: Diagnosis not present

## 2021-06-11 DIAGNOSIS — F03911 Unspecified dementia, unspecified severity, with agitation: Secondary | ICD-10-CM

## 2021-06-11 DIAGNOSIS — E876 Hypokalemia: Secondary | ICD-10-CM

## 2021-06-11 DIAGNOSIS — F0391 Unspecified dementia with behavioral disturbance: Secondary | ICD-10-CM | POA: Diagnosis not present

## 2021-06-11 DIAGNOSIS — I10 Essential (primary) hypertension: Secondary | ICD-10-CM

## 2021-06-11 DIAGNOSIS — I872 Venous insufficiency (chronic) (peripheral): Secondary | ICD-10-CM

## 2021-06-11 DIAGNOSIS — I63512 Cerebral infarction due to unspecified occlusion or stenosis of left middle cerebral artery: Secondary | ICD-10-CM | POA: Diagnosis not present

## 2021-06-11 DIAGNOSIS — I4821 Permanent atrial fibrillation: Secondary | ICD-10-CM

## 2021-06-11 LAB — BASIC METABOLIC PANEL
Anion gap: 7 (ref 5–15)
BUN: 13 mg/dL (ref 8–23)
CO2: 26 mmol/L (ref 22–32)
Calcium: 8.7 mg/dL — ABNORMAL LOW (ref 8.9–10.3)
Chloride: 109 mmol/L (ref 98–111)
Creatinine, Ser: 0.78 mg/dL (ref 0.44–1.00)
GFR, Estimated: >60 mL/min (ref >60)
Glucose, Bld: 81 mg/dL (ref 70–99)
Potassium: 3.3 mmol/L — ABNORMAL LOW (ref 3.5–5.1)
Sodium: 142 mmol/L (ref 135–145)

## 2021-06-11 LAB — CBC
HCT: 37.4 % (ref 36.0–46.0)
Hemoglobin: 12.5 g/dL (ref 12.0–15.0)
MCH: 30.2 pg (ref 26.0–34.0)
MCHC: 33.4 g/dL (ref 30.0–36.0)
MCV: 90.3 fL (ref 80.0–100.0)
Platelets: 319 10*3/uL (ref 150–400)
RBC: 4.14 MIL/uL (ref 3.87–5.11)
RDW: 14.5 % (ref 11.5–15.5)
WBC: 8.3 10*3/uL (ref 4.0–10.5)
nRBC: 0 % (ref 0.0–0.2)

## 2021-06-11 LAB — PHOSPHORUS: Phosphorus: 2.3 mg/dL — ABNORMAL LOW (ref 2.5–4.6)

## 2021-06-11 LAB — GLUCOSE, CAPILLARY: Glucose-Capillary: 75 mg/dL (ref 70–99)

## 2021-06-11 LAB — MAGNESIUM: Magnesium: 1.8 mg/dL (ref 1.7–2.4)

## 2021-06-11 MED ORDER — HYDRALAZINE HCL 20 MG/ML IJ SOLN
10.0000 mg | Freq: Four times a day (QID) | INTRAMUSCULAR | Status: DC | PRN
  Administered 2021-06-11 – 2021-06-13 (×2): 10 mg via INTRAVENOUS
  Filled 2021-06-11 (×3): qty 1

## 2021-06-11 MED ORDER — POTASSIUM CHLORIDE 10 MEQ/100ML IV SOLN
10.0000 meq | INTRAVENOUS | Status: AC
  Administered 2021-06-11 (×4): 10 meq via INTRAVENOUS
  Filled 2021-06-11 (×4): qty 100

## 2021-06-11 MED ORDER — QUETIAPINE FUMARATE 50 MG PO TABS
50.0000 mg | ORAL_TABLET | Freq: Every day | ORAL | Status: DC
  Administered 2021-06-11 – 2021-06-18 (×8): 50 mg via ORAL
  Filled 2021-06-11: qty 2
  Filled 2021-06-11: qty 1
  Filled 2021-06-11 (×2): qty 2
  Filled 2021-06-11: qty 1
  Filled 2021-06-11 (×2): qty 2
  Filled 2021-06-11: qty 1
  Filled 2021-06-11: qty 2

## 2021-06-11 MED ORDER — LACTATED RINGERS IV BOLUS
1000.0000 mL | Freq: Once | INTRAVENOUS | Status: AC
  Administered 2021-06-12: 1000 mL via INTRAVENOUS

## 2021-06-11 MED ORDER — OLANZAPINE 5 MG PO TABS: 5.0000 mg | ORAL_TABLET | Freq: Every day | ORAL | Status: DC

## 2021-06-11 MED ORDER — HYDRALAZINE HCL 25 MG PO TABS
25.0000 mg | ORAL_TABLET | Freq: Two times a day (BID) | ORAL | Status: DC
  Administered 2021-06-12 – 2021-06-25 (×25): 25 mg via ORAL
  Filled 2021-06-11 (×26): qty 1

## 2021-06-11 MED ORDER — HYDRALAZINE HCL 50 MG PO TABS
50.0000 mg | ORAL_TABLET | Freq: Two times a day (BID) | ORAL | Status: DC
  Administered 2021-06-11: 50 mg via ORAL
  Filled 2021-06-11 (×2): qty 1

## 2021-06-11 MED ORDER — MAGNESIUM SULFATE 2 GM/50ML IV SOLN
2.0000 g | Freq: Once | INTRAVENOUS | Status: AC
  Administered 2021-06-11: 2 g via INTRAVENOUS
  Filled 2021-06-11: qty 50

## 2021-06-11 MED ORDER — QUETIAPINE FUMARATE 25 MG PO TABS: 50.0000 mg | ORAL_TABLET | Freq: Every day | ORAL | Status: DC

## 2021-06-11 NOTE — Progress Notes (Signed)
Physical Therapy Treatment Patient Details Name: [name] MRN: [id number] DOB: 28-Dec-1940 Today's Date: 06/11/2021    History of Present Illness This 80 y.o. female admitted with acute onset Rt sided weakness and aphasia.  CTA showed acute L M2 oacclusion > thrombectomy.  MRI showed:  Multifocal acute ischemia within the right internal capsule, left frontal white matter and left insula. 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  PMH includes:  cat bite with cellulitis Lt Lower leg, HTN, h/o CVAs, mild dementia    PT Comments    Treatment limited due to pt agitation and combativeness. She is oriented to self only; demonstrating increased verbalizations today and able to name objects. Pt able to sit up on edge of bed without physical assist. Attempted to use bedside commode as visual cue to mobilize out of bed, however, pt attempting to knock it over and swinging arms/legs at therapist, stating, "why do you want to kill me?" She refused to eat or drink. Will continue to progress mobility as tolerated.    Follow Up Recommendations  CIR;Supervision/Assistance - 24 hour     Equipment Recommendations  Other (comment) (TBA)    Recommendations for Other Services Rehab consult     Precautions / Restrictions Precautions Precautions: Fall Precaution Comments: sundowns, agitation, posey, wrist restraints/mitts Restrictions Weight Bearing Restrictions: No    Mobility  Bed Mobility Overal bed mobility: Needs Assistance Bed Mobility: Supine to Sit;Sit to Supine     Supine to sit: Min guard;+2 for physical assistance Sit to supine: Mod assist;+2 for physical assistance   General bed mobility comments: Pt with BLE's off edge of bed upon entrance, able to pull trunk into sitting position upon second attempt wtihout physical assist. Assist for initiation and guidance back into bed as pt resistive    Transfers                 General transfer comment: deferred due to level of pt  agitation  Ambulation/Gait                 Stairs             Wheelchair Mobility    Modified Rankin (Stroke Patients Only) Modified Rankin (Stroke Patients Only) Pre-Morbid Rankin Score: No symptoms Modified Rankin: Moderate disability     Balance Overall balance assessment: Needs assistance Sitting-balance support: Feet supported Sitting balance-[name]: Fair                                      Cognition Arousal/Alertness: Awake/alert Behavior During Therapy: WFL for tasks assessed/performed Overall Cognitive Status: Impaired/Different from baseline Area of Impairment: Orientation;Memory;Attention;Following commands;Safety/judgement;Awareness                 Orientation Level: Disoriented to;Place;Time;Situation Current Attention Level: Sustained Memory: Decreased short-term memory Following Commands: Follows one step commands inconsistently Safety/Judgement: Decreased awareness of safety;Decreased awareness of deficits Awareness: Intellectual   General Comments: Pt agitated, restless and combative. Oriented to self only. Increased verbalizations today, stating she wants to die and "why are you trying to kill me?" Pt swinging arms and legs at therapist stating, "I'm going to kick you in the nuts." Able to name objects such as a pencil      Exercises      General Comments General comments (skin integrity, edema, etc.): HR 48-70's, SpO2 100% on RA      Pertinent Vitals/Pain Pain Assessment:  Faces Faces Pain Scale: No hurt    Home Living                      Prior Function            PT Goals (current goals can now be found in the care plan section) Acute Rehab PT Goals Patient Stated Goal: for her to return to baseline PT Goal Formulation: Patient unable to participate in goal setting Time For Goal Achievement: 06/24/21 Potential to Achieve Goals: Good Progress towards PT goals: Not progressing toward goals  - comment (agitation)    Frequency    Min 4X/week      PT Plan Current plan remains appropriate    Co-evaluation              AM-PAC PT "6 Clicks" Mobility   Outcome Measure  Help needed turning from your back to your side while in a flat bed without using bedrails?: A Little Help needed moving from lying on your back to sitting on the side of a flat bed without using bedrails?: A Little Help needed moving to and from a bed to a chair (including a wheelchair)?: A Little Help needed standing up from a chair using your arms (e.g., wheelchair or bedside chair)?: A Little Help needed to walk in hospital room?: A Little Help needed climbing 3-5 steps with a railing? : A Lot 6 Click Score: 17    End of Session   Activity Tolerance: Patient tolerated treatment well Patient left: in bed;with call bell/phone within reach;with bed alarm set;with family/visitor present;with restraints reapplied Nurse Communication: Mobility status PT Visit Diagnosis: Unsteadiness on feet (R26.81);Other abnormalities of gait and mobility (R26.89);Difficulty in walking, not elsewhere classified (R26.2);Other symptoms and signs involving the nervous system [id number])     Time: [id number] PT Time Calculation (min) (ACUTE ONLY): 29 min  Charges:  $Therapeutic Activity: 23-37 mins                     Wyona Almas, PT, DPT Acute Rehabilitation Services Pager [phone] Office [phone]    Deno Etienne 06/11/2021, 8:57 AM

## 2021-06-11 NOTE — Progress Notes (Signed)
Referring Physician(s): Stroke Team  Supervising Physician: Luanne Bras  Patient Status:  [facility] - In-pt  Chief Complaint:  Stroke  s/p /P Lt common carotid arteriogram followed by complete revascularization of occluded Lt MCA inf division with x 1 pass with 28m x40 mm solitaireX retriever and contact aspiration achieving a TICI 3 revascularization by Dr. SLuanne Bras  Subjective:  Patient sitting up in bed with physical therapy at bedside. Alert to person which is baseline per notes. Patient no longer showing signs of global aphasia and is able to identify common objects including a pen. Per staff patient has had an increase in restless since last night.  Allergies: Amlodipine, Haldol [haloperidol lactate], and Hctz [hydrochlorothiazide]  Medications: Prior to Admission medications   Medication Sig Start Date End Date Taking? Authorizing Provider  atorvastatin (LIPITOR) 40 MG tablet Take 1 tablet (40 mg total) by mouth at bedtime. 05/25/21  Yes Johnson, Megan P, DO  ciprofloxacin (CIPRO) 500 MG tablet Take 1 tablet (500 mg total) by mouth every 12 (twelve) hours. 06/06/21  Yes SBilley Co MD  diclofenac Sodium (VOLTAREN) 1 % GEL Apply 4 g topically 4 (four) times daily. Patient taking differently: Apply 4 g topically 4 (four) times daily as needed (left knee pain). 05/24/21  Yes Johnson, Megan P, DO  hydrALAZINE (APRESOLINE) 10 MG tablet Take 20 mg by mouth 2 (two) times daily.   Yes [provider]  nystatin (MYCOSTATIN) 100000 UNIT/ML suspension Take 5 mLs (500,000 Units total) by mouth 4 (four) times daily. Patient taking differently: Take 5 mLs by mouth 4 (four) times daily as needed (thrush). 04/11/21  Yes Danford, CSuann Larry MD  propranolol (INDERAL) 10 MG tablet Take 1 tablet (10 mg total) by mouth 2 (two) times daily. Patient taking differently: Take 10 mg by mouth See admin instructions. Take one tablet (10 mg) by mouth twice daily unless heart  rate <60 05/24/21  Yes Johnson, Megan P, DO  Dextromethorphan-quiNIDine (NUEDEXTA) 20-10 MG capsule Take 1 capsule by mouth at bedtime. Patient not taking: No sig reported 05/24/21   JPark LiterP, DO  hydrALAZINE (APRESOLINE) 25 MG tablet Take 1 tablet (25 mg total) by mouth 2 (two) times daily. Patient not taking: No sig reported 06/05/21   JPark LiterP, DO  rivaroxaban (XARELTO) 20 MG TABS tablet TAKE 1 TABLET BY MOUTH EVERY DAY WITH DINNER Patient taking differently: Take 20 mg by mouth daily with supper. 05/24/21   Johnson, Megan P, DO     Vital Signs: BP (!) 170/131   Pulse 86   Temp 99 F (37.2 C) (Oral)   Resp (!) 27   SpO2 96%   Physical Exam Vitals and nursing note reviewed.  Constitutional:      Appearance: She is well-developed.  HENT:     Head: Normocephalic and atraumatic.  Eyes:     Conjunctiva/sclera: Conjunctivae normal.  Pulmonary:     Effort: Pulmonary effort is normal.  Musculoskeletal:     Cervical back: Normal range of motion.  Neurological:     Mental Status: She is alert. Mental status is at baseline.     Comments: Alert, Oriented to person only   Speech and comprehension is intact.  No facial droop noted Tongue midline Can spontaneously move all 4 extremities.   Speech, cognition and language  are generally intact.  Comprehension and fluency are baseline  Gait not assessed Romberg not assessed Heel to toe not assessed Distal pulses not assessed  Psychiatric:     Comments: aggitated    Imaging: MR BRAIN WO CONTRAST  Result Date: 06/10/2021 CLINICAL DATA:  Neuro deficit, acute, stroke suspected EXAM: MRI HEAD WITHOUT CONTRAST TECHNIQUE: Multiplanar, multiecho pulse sequences of the brain and surrounding structures were obtained without intravenous contrast. COMPARISON:  None. FINDINGS: Brain: Multifocal acute ischemia within the right internal capsule, left frontal white matter and left insula. Old left occipital infarct and old left  frontal infarct. Bilateral confluent hyperintense T2-weighted signal in the white matter. Normal volume of CSF spaces. No chronic microhemorrhage. Normal midline structures. Vascular: Major flow voids are preserved. Skull and upper cervical spine: Normal calvarium and skull base. Visualized upper cervical spine and soft tissues are normal. Sinuses/Orbits:No paranasal sinus fluid levels or advanced mucosal thickening. No mastoid or middle ear effusion. Normal orbits. IMPRESSION: 1. Multifocal acute ischemia within the right internal capsule, left frontal white matter and left insula. No hemorrhage or mass effect. 2. 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 Electronically Signed   By: Ulyses Jarred M.D.   On: 06/10/2021 01:52   DG CHEST PORT 1 VIEW  Result Date: 06/09/2021 CLINICAL DATA:  ET tube placement EXAM: PORTABLE CHEST 1 VIEW COMPARISON:  03/31/2021 FINDINGS: Endotracheal tube is 2 cm above the carina. NG tube enters the stomach. Cardiomegaly. Lungs clear. No effusions or edema. Aortic atherosclerosis. No acute bony abnormality. IMPRESSION: ET tube 2 cm above the carina. Cardiomegaly.  Aortic atherosclerosis. No active disease. Electronically Signed   By: Rolm Baptise M.D.   On: 06/09/2021 22:00   VAS [place] GROIN PSEUDOANEURYSM  Result Date: 06/10/2021  ARTERIAL PSEUDOANEURYSM  Patient Name:  [name]  Date of Exam:   06/10/2021 Medical Rec #: [id number]     Accession #:    [id number] Date of Birth: 1940-11-08      Patient Gender: F Patient Age:   80 years Exam Location:  [facility] Procedure:      VAS [place] Gloriajean Dell Referring Phys: Durenda Guthrie --------------------------------------------------------------------------------  Exam: Right groin Indications: Patient complains of "mild hardening" noted under RT CFA puncture site per MD. History: S/p IR procedure. Comparison Study: no previous exams Performing Technologist: Jody Hill RVT, RDMS  Examination Guidelines: A complete evaluation  includes B-mode imaging, spectral Doppler, color Doppler, and power Doppler as needed of all accessible portions of each vessel. Bilateral testing is considered an integral part of a complete examination. Limited examinations for reoccurring indications may be performed as noted. +------------+----------+--------+------+----------+ Right DuplexPSV (cm/s)WaveformPlaqueComment(s) +------------+----------+--------+------+----------+ CFA            139    biphasic                 +------------+----------+--------+------+----------+ PFA            132    biphasic                 +------------+----------+--------+------+----------+ Prox SFA       139    biphasic                 +------------+----------+--------+------+----------+  Summary: No evidence of pseudoaneurysm, AVF or DVT  Diagnosing physician: Jamelle Haring Electronically signed by Jamelle Haring on 06/10/2021 at 2:04:52 PM.   --------------------------------------------------------------------------------    Final    ECHOCARDIOGRAM COMPLETE  Result Date: 06/10/2021    ECHOCARDIOGRAM REPORT   Patient Name:   [facility] Date of Exam: 06/10/2021 Medical Rec #:  [id number]    Height:  65.0 in Accession #:    [id number]   Weight:       203.5 lb Date of Birth:  [birth year]     BSA:          1.993 m Patient Age:    46 years     BP:           134/59 mmHg Patient Gender: F            HR:           77 bpm. Exam Location:  Inpatient Procedure: 2D Echo, Cardiac Doppler, Color Doppler and 3D Echo Indications:    Stroke I63.9  History:        Patient has prior history of Echocardiogram examinations, most                 recent 03/31/2021. Signs/Symptoms:Altered Mental Status.  Sonographer:    Merrie Roof RDCS Referring Phys: [id number] [place]  1. Left ventricular ejection fraction, by estimation, is 65 to 70%. The left ventricle has normal function. The left ventricle has no regional wall motion abnormalities. There is mild  eccentric left ventricular hypertrophy of the posterior-lateral segment. Left ventricular diastolic parameters are indeterminate.  2. Right ventricular systolic function is normal. The right ventricular size is normal. There is normal pulmonary artery systolic pressure. The estimated right ventricular systolic pressure is 99991111 mmHg.  3. Left atrial size was severely dilated.  4. Right atrial size was moderately dilated.  5. The mitral valve is degenerative. Mild to moderate mitral valve regurgitation. No evidence of mitral stenosis. Moderate mitral annular calcification.  6. The aortic valve is grossly normal. There is mild calcification of the aortic valve. Aortic valve regurgitation is not visualized. Mild aortic valve sclerosis is present, with no evidence of aortic valve stenosis.  7. The inferior vena cava is normal in size with greater than 50% respiratory variability, suggesting right atrial pressure of 3 mmHg. Conclusion(s)/Recommendation(s): No intracardiac source of embolism detected on this transthoracic study. A transesophageal echocardiogram can be considered to exclude cardiac source of embolism if clinically indicated. FINDINGS  Left Ventricle: Left ventricular ejection fraction, by estimation, is 65 to 70%. The left ventricle has normal function. The left ventricle has no regional wall motion abnormalities. 3D left ventricular ejection fraction analysis performed but not reported based on interpreter judgement due to suboptimal quality. The left ventricular internal cavity size was normal in size. There is mild eccentric left ventricular hypertrophy of the posterior-lateral segment. Left ventricular diastolic parameters are indeterminate. Right Ventricle: The right ventricular size is normal. No increase in right ventricular wall thickness. Right ventricular systolic function is normal. There is normal pulmonary artery systolic pressure. The tricuspid regurgitant velocity is 2.65 m/s, and  with an  assumed right atrial pressure of 3 mmHg, the estimated right ventricular systolic pressure is 99991111 mmHg. Left Atrium: Left atrial size was severely dilated. Right Atrium: Right atrial size was moderately dilated. Pericardium: There is no evidence of pericardial effusion. Mitral Valve: The mitral valve is degenerative in appearance. Moderate mitral annular calcification. Mild to moderate mitral valve regurgitation. No evidence of mitral valve stenosis. The mean mitral valve gradient is 2.3 mmHg with average heart rate of 64 bpm. Tricuspid Valve: The tricuspid valve is normal in structure. Tricuspid valve regurgitation is mild . No evidence of tricuspid stenosis. Aortic Valve: The aortic valve is grossly normal. There is mild calcification of the aortic valve. Aortic valve regurgitation is not visualized. Mild aortic valve sclerosis  is present, with no evidence of aortic valve stenosis. Aortic valve mean gradient  measures 8.0 mmHg. Aortic valve peak gradient measures 15.2 mmHg. Aortic valve area, by VTI measures 2.28 cm. Pulmonic Valve: The pulmonic valve was normal in structure. Pulmonic valve regurgitation is trivial. No evidence of pulmonic stenosis. Aorta: The aortic root is normal in size and structure. Venous: The inferior vena cava is normal in size with greater than 50% respiratory variability, suggesting right atrial pressure of 3 mmHg. IAS/Shunts: The interatrial septum was not well visualized.  LEFT VENTRICLE PLAX 2D LVIDd:         6.40 cm LVIDs:         4.00 cm LV PW:         1.20 cm LV IVS:        0.80 cm LVOT diam:     2.00 cm  3D Volume EF: LV SV:         91       3D EF:        71 % LV SV Index:   46       LV EDV:       142 ml LVOT Area:     3.14 cm LV ESV:       41 ml                         LV SV:        100 ml RIGHT VENTRICLE RV Basal diam:  3.90 cm LEFT ATRIUM              Index        RIGHT ATRIUM           Index LA diam:        6.20 cm  3.11 cm/m   RA Area:     27.20 cm LA Vol (A2C):   175.0  ml 87.82 ml/m  RA Volume:   73.60 ml  36.93 ml/m LA Vol (A4C):   252.0 ml 126.46 ml/m LA Biplane Vol: 220.0 ml 110.40 ml/m  AORTIC VALVE AV Area (Vmax):    2.17 cm AV Area (Vmean):   2.25 cm AV Area (VTI):     2.28 cm AV Vmax:           195.00 cm/s AV Vmean:          128.000 cm/s AV VTI:            0.401 m AV Peak Grad:      15.2 mmHg AV Mean Grad:      8.0 mmHg LVOT Vmax:         135.00 cm/s LVOT Vmean:        91.800 cm/s LVOT VTI:          0.291 m LVOT/AV VTI ratio: 0.73  AORTA Ao Root diam: 3.30 cm MITRAL VALVE           TRICUSPID VALVE MV Mean grad: 2.3 mmHg TR Peak grad:   28.1 mmHg                        TR Vmax:        265.00 cm/s                         SHUNTS  Systemic VTI:  0.29 m                        Systemic Diam: 2.00 cm Cherlynn Kaiser MD Electronically signed by Cherlynn Kaiser MD Signature Date/Time: 06/10/2021/4:27:03 PM    Final    CT HEAD CODE STROKE WO CONTRAST  Result Date: 06/09/2021 CLINICAL DATA:  Code stroke. Right-sided weakness and slurred speech EXAM: CT HEAD WITHOUT CONTRAST TECHNIQUE: Contiguous axial images were obtained from the base of the skull through the vertex without intravenous contrast. COMPARISON:  03/30/2021 brain MRI FINDINGS: Brain: No evidence of acute infarction, hemorrhage, hydrocephalus, extra-axial collection or mass lesion/mass effect. Remote left occipital and lateral frontal cortex and white matter infarcts, moderate in size. Confluent chronic small vessel ischemia in the hemispheric white matter. No visible acute infarct. Stable sulcal calcification along the right frontal convexity. Vascular: Best seen on reformats is a branching hyperdensity in the left M2 region. CTA is pending. Atherosclerosis Skull: No acute finding Sinuses/Orbits: No acute finding Other: These results were communicated to Dr Quinn Axe at 9:09 am on 06/09/2021 by text page via the [facility] messaging system. IMPRESSION: 1. Hyperdense left M2 branch with no hemorrhage or  visible acute infarct. 2. Chronic small vessel ischemia with remote left MCA and PCA branch infarcts. Electronically Signed   By: Monte Fantasia M.D.   On: 06/09/2021 09:10   CT ANGIO HEAD NECK W WO CM W PERF (CODE STROKE)  Addendum Date: 06/09/2021   ADDENDUM REPORT: 06/09/2021 09:32 ADDENDUM: Critical Value/emergent results were called by telephone at the time of interpretation on 06/09/2021 at 9:32 am to provider [facility] , who verbally acknowledged these results. Electronically Signed   By: Monte Fantasia M.D.   On: 06/09/2021 09:32   Result Date: 06/09/2021 CLINICAL DATA:  Stroke suspected. EXAM: CT ANGIOGRAPHY HEAD AND NECK CT PERFUSION BRAIN TECHNIQUE: Multidetector CT imaging of the head and neck was performed using the standard protocol during bolus administration of intravenous contrast. Multiplanar CT image reconstructions and MIPs were obtained to evaluate the vascular anatomy. Carotid stenosis measurements (when applicable) are obtained utilizing NASCET criteria, using the distal internal carotid diameter as the denominator. Multiphase CT imaging of the brain was performed following IV bolus contrast injection. Subsequent parametric perfusion maps were calculated using RAPID software. CONTRAST:  Dose is not currently none on this in progress study. COMPARISON:  Head CT from earlier the same day FINDINGS: CTA NECK FINDINGS Aortic arch: Atheromatous plaque.  Three vessel branching. Right carotid system: Mild plaque at the bifurcation without stenosis or ulceration. Left carotid system: Mild calcified plaque at the bifurcation without stenosis or ulceration Vertebral arteries: No proximal subclavian atherosclerosis. Codominant vertebral arteries that are tortuous but widely patent to the dura. Skeleton: Advanced cervical spine degeneration with particularly severe right-sided C1-2 facet degeneration with bulky spurring impinging on the right C2 nerve root. C2-3 and C3-4 facet ankylosis. Other  neck: No acute finding Upper chest: No acute finding Review of the MIP images confirms the above findings CTA HEAD FINDINGS Anterior circulation: Atheromatous plaque at the bilateral carotid siphon. Distal left M2 branch cut off with some downstream reconstitution. No additional branch occlusion is seen. Negative for aneurysm Posterior circulation: Vertebral and basilar arteries are smoothly contoured and widely patent. No branch occlusion, beading, or aneurysm. Venous sinuses: Diffusely patent Anatomic variants: None significant Review of the MIP images confirms the above findings CT Brain Perfusion Findings: ASPECTS: 10 when accounting for chronic changes CBF (<30%)  Volume: 56m Perfusion (Tmax>6.0s) volume: 564mMismatch Volume: 5161m page and call has been placed to neurology. IMPRESSION: 1. Emergent left M2 branch occlusion with penumbra pattern on CT perfusion involving a 51 cc area. 2. Mild for age atherosclerosis without flow limiting stenosis or visible embolic source. Electronically Signed: By: JonMonte FantasiaD. On: 06/09/2021 09:28    Labs:  CBC: Recent Labs    05/24/21 1221 06/09/21 0854 06/09/21 1456 06/09/21 1501 06/10/21 0559  WBC 7.2 5.4 7.4  --  6.2  HGB 13.9 13.8 15.2* 13.6 12.3  HCT 43.6 40.8 45.8 40.0 37.4  PLT 348 317 293  --  314    COAGS: Recent Labs    03/29/21 1159 06/09/21 0854  INR 1.0 1.0  APTT 24 30    BMP: Recent Labs    04/08/21 1523 05/24/21 1221 06/09/21 0854 06/09/21 1456 06/09/21 1501 06/09/21 2354  NA 139 142 142  --  141 136  K 3.8 4.9 4.0  --  3.6 3.6  CL 104 101 107  --   --  105  CO2 '27 23 28  '$ --   --  21*  GLUCOSE 102* 71 94  --   --  133*  BUN '16 12 12  '$ --   --  11  CALCIUM 8.6* 9.3 9.3  --   --  8.7*  CREATININE 0.62 0.81 0.88 0.74  --  0.58  GFRNONAA >60  --  >60 >60  --  >60    LIVER FUNCTION TESTS: Recent Labs    03/31/21 1305 04/01/21 0413 05/24/21 1221 06/09/21 0854  BILITOT 1.5* 1.4* 0.8 1.1  AST '24 24 17 20   '$ ALT '11 11 7 11  '$ ALKPHOS 62 58 102 76  PROT 7.1 6.7 6.8 7.3  ALBUMIN 3.5 3.1* 4.3 3.8    Assessment and Plan:  [age]o. female inpatient. History of cognitive impairment thought to be dementia without formal diagnosis,  a fib,, HTN, CVA in May 2022. [facility] with AMS, right sided weakness  and slurred speech. CT stroke reads Hyperdense left M2 branch with no hemorrhage or visible acute infarct. 2. Chronic small vessel ischemia with remote left MCA and PCA branch infarcts. [facility]  Lt common carotid arteriogram followed by complete revascularization of occluded Lt MCA inf division with x 1 pass with 4mm4m0 mm solitaireX retriever and contact aspiration achieving a TICI 3 revascularization by Dr. SanjLuanne Brastient now extubated however remains in wrist restraints due to agitation. Patient alert and oriented to person only which notes is baseline.  Patient able to identify common objects including pen. Per bedside RN patient is able to ambulate up and down the hall with   Patient condition continuing to improve. Continue to take Brilinta 90 BID and ASA 81 mg daily. Plan to follow up with Dr. [facility] is [facility] weeks from discharge. Further plans per neurology/ CCM.  NIR continue to follow.    Electronically Signed: JennJacqualine Mau 06/11/2021, 9:10 AM   I spent a total of 15 Minutes at the patient's bedside AND on the patient's hospital floor or unit, greater than 50% of which was counseling/coordinating care for complete revascularization of occluded Lt MCA inf division with x 1 pass with 4mm 69m mm solitaireX retriever and contact aspiration achieving a TICI 3 revascularization by Dr. SanjeLuanne Bras

## 2021-06-11 NOTE — Progress Notes (Signed)
STROKE TEAM PROGRESS NOTE   SUBJECTIVE (INTERVAL HISTORY) Her daughter-in-law and RN are at the bedside. Pt had sundowning yesterday afternoon, and we started seroquel '25mg'$  x 2. Per report, she slept well last night. However, this morning, she still agitated and combative and cussing, trying to get out of bed. She was on precedex overnight but she developed bradycardia to 30s, so precedex has been turned off. Will request psychiatry consult.    OBJECTIVE Temp:  [98.6 F (37 C)-99 F (37.2 C)] 99 F (37.2 C) (08/07 1600) Pulse Rate:  [33-91] 70 (08/08 1100) Resp:  [10-30] 19 (08/08 1100) BP: (117-184)/(51-131) 137/69 (08/08 1100) SpO2:  [94 %-100 %] 97 % (08/08 1100)  Recent Labs  Lab 06/09/21 0852 06/09/21 1047  GLUCAP 82 75   Recent Labs  Lab 06/09/21 0854 06/09/21 1456 06/09/21 1501 06/09/21 2354 06/11/21 0846  NA 142  --  141 136 142  K 4.0  --  3.6 3.6 3.3*  CL 107  --   --  105 109  CO2 28  --   --  21* 26  GLUCOSE 94  --   --  133* 81  BUN 12  --   --  11 13  CREATININE 0.88 0.74  --  0.58 0.78  CALCIUM 9.3  --   --  8.7* 8.7*  MG  --   --   --   --  1.8  PHOS  --   --   --   --  2.3*   Recent Labs  Lab 06/09/21 0854  AST 20  ALT 11  ALKPHOS 76  BILITOT 1.1  PROT 7.3  ALBUMIN 3.8   Recent Labs  Lab 06/09/21 0854 06/09/21 1456 06/09/21 1501 06/10/21 0559 06/11/21 0846  WBC 5.4 7.4  --  6.2 8.3  NEUTROABS 3.2  --   --  5.0  --   HGB 13.8 15.2* 13.6 12.3 12.5  HCT 40.8 45.8 40.0 37.4 37.4  MCV 90.5 90.2  --  90.1 90.3  PLT 317 293  --  314 319   No results for input(s): CKTOTAL, CKMB, CKMBINDEX, TROPONINI in the last 168 hours. Recent Labs    06/09/21 0854  LABPROT 13.6  INR 1.0   No results for input(s): COLORURINE, LABSPEC, PHURINE, GLUCOSEU, HGBUR, BILIRUBINUR, KETONESUR, PROTEINUR, UROBILINOGEN, NITRITE, LEUKOCYTESUR in the last 72 hours.  Invalid input(s): APPERANCEUR     Component Value Date/Time   CHOL 86 06/10/2021 0559   CHOL  168 01/05/2021 1056   TRIG 49 06/10/2021 0600   HDL 48 06/10/2021 0559   HDL 72 01/05/2021 1056   CHOLHDL 1.8 06/10/2021 0559   VLDL 8 06/10/2021 0559   LDLCALC 30 06/10/2021 0559   LDLCALC 80 01/05/2021 1056   Lab Results  Component Value Date   HGBA1C 5.3 06/10/2021   No results found for: LABOPIA, COCAINSCRNUR, LABBENZ, AMPHETMU, THCU, LABBARB  No results for input(s): ETH in the last 168 hours.  I have personally reviewed the radiological images below and agree with the radiology interpretations.  MR BRAIN WO CONTRAST  Result Date: 06/10/2021 CLINICAL DATA:  Neuro deficit, acute, stroke suspected EXAM: MRI HEAD WITHOUT CONTRAST TECHNIQUE: Multiplanar, multiecho pulse sequences of the brain and surrounding structures were obtained without intravenous contrast. COMPARISON:  None. FINDINGS: Brain: Multifocal acute ischemia within the right internal capsule, left frontal white matter and left insula. Old left occipital infarct and old left frontal infarct. Bilateral confluent hyperintense T2-weighted signal in the white matter. Normal  volume of CSF spaces. No chronic microhemorrhage. Normal midline structures. Vascular: Major flow voids are preserved. Skull and upper cervical spine: Normal calvarium and skull base. Visualized upper cervical spine and soft tissues are normal. Sinuses/Orbits:No paranasal sinus fluid levels or advanced mucosal thickening. No mastoid or middle ear effusion. Normal orbits. IMPRESSION: 1. Multifocal acute ischemia within the right internal capsule, left frontal white matter and left insula. No hemorrhage or mass effect. 2. 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 Electronically Signed   By: Ulyses Jarred M.D.   On: 06/10/2021 01:52   DG CHEST PORT 1 VIEW  Result Date: 06/09/2021 CLINICAL DATA:  ET tube placement EXAM: PORTABLE CHEST 1 VIEW COMPARISON:  03/31/2021 FINDINGS: Endotracheal tube is 2 cm above the carina. NG tube enters the stomach. Cardiomegaly.  Lungs clear. No effusions or edema. Aortic atherosclerosis. No acute bony abnormality. IMPRESSION: ET tube 2 cm above the carina. Cardiomegaly.  Aortic atherosclerosis. No active disease. Electronically Signed   By: Rolm Baptise M.D.   On: 06/09/2021 22:00   VAS [place] GROIN PSEUDOANEURYSM  Result Date: 06/10/2021  ARTERIAL PSEUDOANEURYSM  Patient Name:  [name] Seats  Date of Exam:   06/10/2021 Medical Rec #: [id number]     Accession #:    [id number] Date of Birth: 01-07-1941      Patient Gender: F Patient Age:   29 years Exam Location:  [facility] Procedure:      VAS [place] Gloriajean Dell Referring Phys: Durenda Guthrie --------------------------------------------------------------------------------  Exam: Right groin Indications: Patient complains of "mild hardening" noted under RT CFA puncture site per MD. History: S/p IR procedure. Comparison Study: no previous exams Performing Technologist: Jody Hill RVT, RDMS  Examination Guidelines: A complete evaluation includes B-mode imaging, spectral Doppler, color Doppler, and power Doppler as needed of all accessible portions of each vessel. Bilateral testing is considered an integral part of a complete examination. Limited examinations for reoccurring indications may be performed as noted. +------------+----------+--------+------+----------+ Right DuplexPSV (cm/s)WaveformPlaqueComment(s) +------------+----------+--------+------+----------+ CFA            139    biphasic                 +------------+----------+--------+------+----------+ PFA            132    biphasic                 +------------+----------+--------+------+----------+ Prox SFA       139    biphasic                 +------------+----------+--------+------+----------+  Summary: No evidence of pseudoaneurysm, AVF or DVT  Diagnosing physician: Jamelle Haring Electronically signed by Jamelle Haring on 06/10/2021 at 2:04:52 PM.    --------------------------------------------------------------------------------    Final    ECHOCARDIOGRAM COMPLETE  Result Date: 06/10/2021    ECHOCARDIOGRAM REPORT   Patient Name:   [name] Date of Exam: 06/10/2021 Medical Rec #:  [id number]    Height:       65.0 in Accession #:    [id number]   Weight:       203.5 lb Date of Birth:  05/11/1941     BSA:          1.993 m Patient Age:    71 years     BP:           134/59 mmHg Patient Gender: F            HR:  77 bpm. Exam Location:  Inpatient Procedure: 2D Echo, Cardiac Doppler, Color Doppler and 3D Echo Indications:    Stroke I63.9  History:        Patient has prior history of Echocardiogram examinations, most                 recent 03/31/2021. Signs/Symptoms:Altered Mental Status.  Sonographer:    Merrie Roof RDCS Referring Phys: [id number] [place]  1. Left ventricular ejection fraction, by estimation, is 65 to 70%. The left ventricle has normal function. The left ventricle has no regional wall motion abnormalities. There is mild eccentric left ventricular hypertrophy of the posterior-lateral segment. Left ventricular diastolic parameters are indeterminate.  2. Right ventricular systolic function is normal. The right ventricular size is normal. There is normal pulmonary artery systolic pressure. The estimated right ventricular systolic pressure is 99991111 mmHg.  3. Left atrial size was severely dilated.  4. Right atrial size was moderately dilated.  5. The mitral valve is degenerative. Mild to moderate mitral valve regurgitation. No evidence of mitral stenosis. Moderate mitral annular calcification.  6. The aortic valve is grossly normal. There is mild calcification of the aortic valve. Aortic valve regurgitation is not visualized. Mild aortic valve sclerosis is present, with no evidence of aortic valve stenosis.  7. The inferior vena cava is normal in size with greater than 50% respiratory variability, suggesting right atrial pressure  of 3 mmHg. Conclusion(s)/Recommendation(s): No intracardiac source of embolism detected on this transthoracic study. A transesophageal echocardiogram can be considered to exclude cardiac source of embolism if clinically indicated. FINDINGS  Left Ventricle: Left ventricular ejection fraction, by estimation, is 65 to 70%. The left ventricle has normal function. The left ventricle has no regional wall motion abnormalities. 3D left ventricular ejection fraction analysis performed but not reported based on interpreter judgement due to suboptimal quality. The left ventricular internal cavity size was normal in size. There is mild eccentric left ventricular hypertrophy of the posterior-lateral segment. Left ventricular diastolic parameters are indeterminate. Right Ventricle: The right ventricular size is normal. No increase in right ventricular wall thickness. Right ventricular systolic function is normal. There is normal pulmonary artery systolic pressure. The tricuspid regurgitant velocity is 2.65 m/s, and  with an assumed right atrial pressure of 3 mmHg, the estimated right ventricular systolic pressure is 99991111 mmHg. Left Atrium: Left atrial size was severely dilated. Right Atrium: Right atrial size was moderately dilated. Pericardium: There is no evidence of pericardial effusion. Mitral Valve: The mitral valve is degenerative in appearance. Moderate mitral annular calcification. Mild to moderate mitral valve regurgitation. No evidence of mitral valve stenosis. The mean mitral valve gradient is 2.3 mmHg with average heart rate of 64 bpm. Tricuspid Valve: The tricuspid valve is normal in structure. Tricuspid valve regurgitation is mild . No evidence of tricuspid stenosis. Aortic Valve: The aortic valve is grossly normal. There is mild calcification of the aortic valve. Aortic valve regurgitation is not visualized. Mild aortic valve sclerosis is present, with no evidence of aortic valve stenosis. Aortic valve mean  gradient  measures 8.0 mmHg. Aortic valve peak gradient measures 15.2 mmHg. Aortic valve area, by VTI measures 2.28 cm. Pulmonic Valve: The pulmonic valve was normal in structure. Pulmonic valve regurgitation is trivial. No evidence of pulmonic stenosis. Aorta: The aortic root is normal in size and structure. Venous: The inferior vena cava is normal in size with greater than 50% respiratory variability, suggesting right atrial pressure of 3 mmHg. IAS/Shunts: The interatrial septum was not  well visualized.  LEFT VENTRICLE PLAX 2D LVIDd:         6.40 cm LVIDs:         4.00 cm LV PW:         1.20 cm LV IVS:        0.80 cm LVOT diam:     2.00 cm  3D Volume EF: LV SV:         91       3D EF:        71 % LV SV Index:   46       LV EDV:       142 ml LVOT Area:     3.14 cm LV ESV:       41 ml                         LV SV:        100 ml RIGHT VENTRICLE RV Basal diam:  3.90 cm LEFT ATRIUM              Index        RIGHT ATRIUM           Index LA diam:        6.20 cm  3.11 cm/m   RA Area:     27.20 cm LA Vol (A2C):   175.0 ml 87.82 ml/m  RA Volume:   73.60 ml  36.93 ml/m LA Vol (A4C):   252.0 ml 126.46 ml/m LA Biplane Vol: 220.0 ml 110.40 ml/m  AORTIC VALVE AV Area (Vmax):    2.17 cm AV Area (Vmean):   2.25 cm AV Area (VTI):     2.28 cm AV Vmax:           195.00 cm/s AV Vmean:          128.000 cm/s AV VTI:            0.401 m AV Peak Grad:      15.2 mmHg AV Mean Grad:      8.0 mmHg LVOT Vmax:         135.00 cm/s LVOT Vmean:        91.800 cm/s LVOT VTI:          0.291 m LVOT/AV VTI ratio: 0.73  AORTA Ao Root diam: 3.30 cm MITRAL VALVE           TRICUSPID VALVE MV Mean grad: 2.3 mmHg TR Peak grad:   28.1 mmHg                        TR Vmax:        265.00 cm/s                         SHUNTS                        Systemic VTI:  0.29 m                        Systemic Diam: 2.00 cm Cherlynn Kaiser MD Electronically signed by Cherlynn Kaiser MD Signature Date/Time: 06/10/2021/4:27:03 PM    Final    CT HEAD CODE STROKE WO  CONTRAST  Result Date: 06/09/2021 CLINICAL DATA:  Code stroke. Right-sided weakness and slurred speech EXAM: CT HEAD WITHOUT CONTRAST TECHNIQUE: Contiguous axial images were obtained from the  base of the skull through the vertex without intravenous contrast. COMPARISON:  03/30/2021 brain MRI FINDINGS: Brain: No evidence of acute infarction, hemorrhage, hydrocephalus, extra-axial collection or mass lesion/mass effect. Remote left occipital and lateral frontal cortex and white matter infarcts, moderate in size. Confluent chronic small vessel ischemia in the hemispheric white matter. No visible acute infarct. Stable sulcal calcification along the right frontal convexity. Vascular: Best seen on reformats is a branching hyperdensity in the left M2 region. CTA is pending. Atherosclerosis Skull: No acute finding Sinuses/Orbits: No acute finding Other: These results were communicated to Dr Quinn Axe at 9:09 am on 06/09/2021 by text page via the [facility] messaging system. IMPRESSION: 1. Hyperdense left M2 branch with no hemorrhage or visible acute infarct. 2. Chronic small vessel ischemia with remote left MCA and PCA branch infarcts. Electronically Signed   By: Monte Fantasia M.D.   On: 06/09/2021 09:10   CT ANGIO HEAD NECK W WO CM W PERF (CODE STROKE)  Addendum Date: 06/09/2021   ADDENDUM REPORT: 06/09/2021 09:32 ADDENDUM: Critical Value/emergent results were called by telephone at the time of interpretation on 06/09/2021 at 9:32 am to provider [facility] , who verbally acknowledged these results. Electronically Signed   By: Monte Fantasia M.D.   On: 06/09/2021 09:32   Result Date: 06/09/2021 CLINICAL DATA:  Stroke suspected. EXAM: CT ANGIOGRAPHY HEAD AND NECK CT PERFUSION BRAIN TECHNIQUE: Multidetector CT imaging of the head and neck was performed using the standard protocol during bolus administration of intravenous contrast. Multiplanar CT image reconstructions and MIPs were obtained to evaluate the vascular anatomy.  Carotid stenosis measurements (when applicable) are obtained utilizing NASCET criteria, using the distal internal carotid diameter as the denominator. Multiphase CT imaging of the brain was performed following IV bolus contrast injection. Subsequent parametric perfusion maps were calculated using RAPID software. CONTRAST:  Dose is not currently none on this in progress study. COMPARISON:  Head CT from earlier the same day FINDINGS: CTA NECK FINDINGS Aortic arch: Atheromatous plaque.  Three vessel branching. Right carotid system: Mild plaque at the bifurcation without stenosis or ulceration. Left carotid system: Mild calcified plaque at the bifurcation without stenosis or ulceration Vertebral arteries: No proximal subclavian atherosclerosis. Codominant vertebral arteries that are tortuous but widely patent to the dura. Skeleton: Advanced cervical spine degeneration with particularly severe right-sided C1-2 facet degeneration with bulky spurring impinging on the right C2 nerve root. C2-3 and C3-4 facet ankylosis. Other neck: No acute finding Upper chest: No acute finding Review of the MIP images confirms the above findings CTA HEAD FINDINGS Anterior circulation: Atheromatous plaque at the bilateral carotid siphon. Distal left M2 branch cut off with some downstream reconstitution. No additional branch occlusion is seen. Negative for aneurysm Posterior circulation: Vertebral and basilar arteries are smoothly contoured and widely patent. No branch occlusion, beading, or aneurysm. Venous sinuses: Diffusely patent Anatomic variants: None significant Review of the MIP images confirms the above findings CT Brain Perfusion Findings: ASPECTS: 10 when accounting for chronic changes CBF (<30%) Volume: 57m Perfusion (Tmax>6.0s) volume: 528mMismatch Volume: 5141m page and call has been placed to neurology. IMPRESSION: 1. Emergent left M2 branch occlusion with penumbra pattern on CT perfusion involving a 51 cc area. 2. Mild for  age atherosclerosis without flow limiting stenosis or visible embolic source. Electronically Signed: By: JonMonte FantasiaD. On: 06/09/2021 09:28     PHYSICAL EXAM  Temp:  [98.6 F (37 C)-99 F (37.2 C)] 99 F (37.2 C) (08/07 1600) Pulse Rate:  [33-91]  70 (08/08 1100) Resp:  [10-30] 19 (08/08 1100) BP: (117-184)/(51-131) 137/69 (08/08 1100) SpO2:  [94 %-100 %] 97 % (08/08 1100)  General - Well nourished, well developed, agitated and combative, trying to get out of bed, in chest posey restrain  Ophthalmologic - fundi not visualized due to noncooperation.  Cardiovascular - irregularly irregular heart rate and rhythm.  Neuro - awake, alert, eyes open, not answer orientation questions appropriately, agitated and combative, able to say "it is all about you" "I am going to piss you". Not following commands. No gaze palsy, tracking bilaterally, not consistently blinking to visual threat bilaterally. No facial droop. Tongue protrusion not cooperative. Bilateral UEs and LEs spontaneous movement, equal bilaterally on observation, Sensation, coordination and gait not tested.   ASSESSMENT/PLAN [name] is a 80 y.o. female with history of afib off Xarelto due to recent hematuria, HTN, dementia, stroke in 03/2021, NMS with haldol admitted for aphasia and right sided weakness. No tPA given due to recent hematuria and stroke within 3 months.    Stroke:  right IC, left insular cortex and left frontal infarcts, embolic pattern, secondary to AFib off Xarelto due to hematuria CT head no acute finding CTA head and neck left M2 occlusion CTP 0/51, positive for penumbra MRI Right IC, left insular cortex and left frontal infarcts 2D Echo EF 65-70% LDL 30 HgbA1c 5.3 lovenox for VTE prophylaxis Xarelto (rivaroxaban) daily (off due to hematuria) prior to admission, now resumed Xarelto Patient family counseled to be compliant with her antithrombotic medications Ongoing aggressive stroke risk factor  management Therapy recommendations:  pending Disposition:  pending   Hematuria  Per family, only lasted 2 days with dark urine PCP put Xarelto on hold until sees urologist Urologist saw her last week, put on Cipro for 3 days and recommend to resume Xarelto after cipro course finished Pt about to start Xarelto today per original schedule Resumed Xarelto 8/8  Hx of stroke 03/2021 left MCA scattered punctate infarcts, MRA and CUS neg, EF 60-65%, LDL 73, A1C 5.5. stroke etiology was due to noncompliance of Xarelto which was resumed on discharge.  Developed NMS after haldol use during 03/2021 admission. Had ICU monitoring, ativan PRN and no dantrolene needed.   Afib Off home Xarelto due to recent hematuria Urologist plan to start Xarelto today Now on ASA 81 -> Xarelto  Dementia  No official diagnosis yet due to frequent cancellation/rescheduling of neurology appointment Currently pending appointment in 07/2021 Cognitive impairment at home with sundowning, needing 24/7 supervision. Needs help for bathing, but able to eat and dress by herself but slow and sometimes needs help. Now on posey restrain, agitated and combative Continue seroquel Qhs Psychiatry consulted, appreciate help  Hypertension Stable On hydralazine 50 bid Long term BP goal normotensive  Hyperlipidemia Home meds:  lipitor 40  LDL 30, goal < 70 On lipitor 40 Continue statin at discharge  Other Stroke Risk Factors Advanced age  Other [facility] day # 2  This patient is critically ill due to stroke s/p thrombectomy, afib on AC, dementia with behavior disturbance and at significant risk of neurological worsening, death form delirium, encephalopathy, heart failure, recurrent stroke, seizure. This patient's care requires constant monitoring of vital signs, hemodynamics, respiratory and cardiac monitoring, review of multiple databases, neurological assessment, discussion with family, other specialists  and medical decision making of high complexity. I spent 50 minutes of neurocritical care time in the care of this patient. I had long discussion with daughter in  law at bedside, updated pt current condition, treatment plan and potential prognosis, and answered all the questions. She expressed understanding and appreciation.   Rosalin Hawking, MD PhD Stroke Neurology 06/11/2021 11:43 AM    To contact Stroke Continuity provider, please refer to [email or web address] After hours, contact General Neurology

## 2021-06-11 NOTE — Progress Notes (Signed)
eLink Physician-Brief Progress Note Patient Name: [name] DOB: 08-30-1941 MRN: [id number]   Date of Service  06/11/2021  HPI/Events of Note  Hypotension - BP = 83/44 post evening dose of Hydralazine 50 mg PO.   eICU Interventions  Plan: Decrease Hydralazine to 25 mg PO Q 12 hours. Bolus with LR 1 liter IV over 1 hour now.      Intervention Category Major Interventions: Hypotension - evaluation and management  Khadeejah Castner Cornelia Copa 06/11/2021, 11:50 PM

## 2021-06-11 NOTE — Consult Note (Signed)
[facility] Face-to-Face Psychiatry Consult   Reason for Consult:  Agitation in the setting of dementia Referring Physician:  Rosalin Hawking, MD Patient Identification: [name] MRN:  [id number] Principal Diagnosis: <principal problem not specified> Diagnosis:  Active Problems:   Stroke [facility])   Acute ischemic left MCA stroke ([place])   Middle cerebral artery embolism, left   Agitation due to dementia [facility])   Total Time spent with patient: 15 minutes  Subjective:   [name] is a 80 y.o. female patient admitted for cold stroke and consulted to psychiatry for agitation in the setting of dementia.  On interview, she first describes her mood as "all right" but then becomes irritable with this Probation officer after asking about sleep, stating that "you need to mind your business."  She did not offer her name nor any insight into place nor time.  She has a history of NMS with Haldol use.  HPI: [name] is an 80 year old female with a psychiatric history of dementia and a medical history of chronic A. fib who was admitted on a code stroke and is s/p mechanical thrombectomy with TICI 3 revascularization.  At baseline, she is oriented to person only.  Past Psychiatric History: Dementia, otherwise unknown  Risk to Self: Potentially, fall risk Risk to Others: No Prior Inpatient Therapy: No Prior Outpatient Therapy: Unknown  Past Medical History:  Past Medical History:  Diagnosis Date   Cat bite of right lower leg [id number]   Cellulitis of lower leg 12/07/2014   Hypertension     Past Surgical History:  Procedure Laterality Date   RADIOLOGY WITH ANESTHESIA N/A 06/09/2021   Procedure: IR WITH ANESTHESIA;  Surgeon: Radiologist, Medication, MD;  Location: [place];  Service: Radiology;  Laterality: N/A;   Family History:  Family History  Problem Relation Age of Onset   Hypertension Mother    Alcohol abuse Father    Heart disease Father    Hypertension Father    Hyperlipidemia Sister    Hypertension Sister     Alcohol abuse Brother    Hypertension Brother    Family Psychiatric  History: See above Social History:  Social History   Substance and Sexual Activity  Alcohol Use No     Social History   Substance and Sexual Activity  Drug Use No    Social History   Socioeconomic History   Marital status: Widowed    Spouse name: Not on file   Number of children: Not on file   Years of education: Not on file   Highest education level: High school graduate  Occupational History   Occupation: retired   Tobacco Use   Smoking status: Former    Types: Cigarettes    Quit date: 11/04/1978    Years since quitting: 42.6   Smokeless tobacco: Never  Vaping Use   Vaping Use: Never used  Substance and Sexual Activity   Alcohol use: No   Drug use: No   Sexual activity: Never  Other Topics Concern   Not on file  Social History Narrative   Not on file   Social Determinants of Health   Financial Resource Strain: Not on file  Food Insecurity: No Food Insecurity   Worried About Running Out of Food in the Last Year: Never true   [place] in the Last Year: Never true  Transportation Needs: No Transportation Needs   Lack of Transportation (Medical): No   Lack of Transportation (Non-Medical): No  Physical Activity: Not on file  Stress:  Not on file  Social Connections: Not on file   Additional Social History:    Allergies:   Allergies  Allergen Reactions   Amlodipine Other (See Comments)    Leg swelling   Haldol [Haloperidol Lactate] Other (See Comments)    Neuroleptic Malignant syndrome    Hctz [Hydrochlorothiazide] Nausea Only and Rash    Labs:  Results for orders placed or performed during the hospital encounter of 06/09/21 (from the past 48 hour(s))  MRSA Next Gen by PCR, Nasal     Status: None   Collection Time: 06/09/21  1:54 PM   Specimen: Nasal Mucosa; Nasal Swab  Result Value Ref Range   MRSA by PCR Next Gen NOT DETECTED NOT DETECTED    Comment: (NOTE) The GeneXpert  MRSA Assay (FDA approved for NASAL specimens only), is one component of a comprehensive MRSA colonization surveillance program. It is not intended to diagnose MRSA infection nor to guide or monitor treatment for MRSA infections. Test performance is not FDA approved in patients less than 28 years old. Performed at [facility], [place] [address], [place], [place] [address]   CBC     Status: Abnormal   Collection Time: 06/09/21  2:56 PM  Result Value Ref Range   WBC 7.4 4.0 - 10.5 K/uL   RBC 5.08 3.87 - 5.11 MIL/uL   Hemoglobin 15.2 (H) 12.0 - 15.0 g/dL   HCT 45.8 36.0 - 46.0 %   MCV 90.2 80.0 - 100.0 fL   MCH 29.9 26.0 - 34.0 pg   MCHC 33.2 30.0 - 36.0 g/dL   RDW 14.2 11.5 - 15.5 %   Platelets 293 150 - 400 K/uL   nRBC 0.0 0.0 - 0.2 %    Comment: Performed at [facility], [place] [address], [place], [place] [address]  Creatinine, serum     Status: None   Collection Time: 06/09/21  2:56 PM  Result Value Ref Range   Creatinine, Ser 0.74 0.44 - 1.00 mg/dL   GFR, Estimated >60 >60 mL/min    Comment: (NOTE) Calculated using the CKD-EPI Creatinine Equation (2021) Performed at [facility] [address] [place], [place] [address]   I-STAT 7, (LYTES, BLD GAS, ICA, H+H)     Status: Abnormal   Collection Time: 06/09/21  3:01 PM  Result Value Ref Range   pH, Arterial 7.353 7.350 - 7.450   pCO2 arterial 46.2 32.0 - 48.0 mmHg   pO2, Arterial 129 (H) 83.0 - 108.0 mmHg   Bicarbonate 25.7 20.0 - 28.0 mmol/L   TCO2 27 22 - 32 mmol/L   O2 Saturation 99.0 %   Acid-Base Excess 0.0 0.0 - 2.0 mmol/L   Sodium 141 135 - 145 mmol/L   Potassium 3.6 3.5 - 5.1 mmol/L   Calcium, Ion 1.26 1.15 - 1.40 mmol/L   HCT 40.0 36.0 - 46.0 %   Hemoglobin 13.6 12.0 - 15.0 g/dL   Collection site art line    Drawn by Operator    Sample type ARTERIAL   Basic metabolic panel     Status: Abnormal   Collection Time: 06/09/21 11:54 PM  Result Value Ref Range   Sodium 136 135 - 145 mmol/L    Potassium 3.6 3.5 - 5.1 mmol/L   Chloride 105 98 - 111 mmol/L   CO2 21 (L) 22 - 32 mmol/L   Glucose, Bld 133 (H) 70 - 99 mg/dL    Comment: Glucose reference range applies only to samples taken  after fasting for at least 8 hours.   BUN 11 8 - 23 mg/dL   Creatinine, Ser 0.58 0.44 - 1.00 mg/dL   Calcium 8.7 (L) 8.9 - 10.3 mg/dL   GFR, Estimated >60 >60 mL/min    Comment: (NOTE) Calculated using the CKD-EPI Creatinine Equation (2021)    Anion gap 10 5 - 15    Comment: Performed at [facility] [address], [place], [place] [address]  Hemoglobin A1c     Status: None   Collection Time: 06/10/21  5:59 AM  Result Value Ref Range   Hgb A1c MFr Bld 5.3 4.8 - 5.6 %    Comment: (NOTE) Pre diabetes:          5.7%-6.4%  Diabetes:              >6.4%  Glycemic control for   <7.0% adults with diabetes    Mean Plasma Glucose 105.41 mg/dL    Comment: Performed at [facility] [address], [place], [place] [address]  Lipid panel     Status: None   Collection Time: 06/10/21  5:59 AM  Result Value Ref Range   Cholesterol 86 0 - 200 mg/dL   Triglycerides 42 <150 mg/dL   HDL 48 >40 mg/dL   Total CHOL/HDL Ratio 1.8 RATIO   VLDL 8 0 - 40 mg/dL   LDL Cholesterol 30 0 - 99 mg/dL    Comment:        Total Cholesterol/HDL:CHD Risk Coronary Heart Disease Risk Table                     Men   Women  1/2 Average Risk   3.4   3.3  Average Risk       5.0   4.4  2 X Average Risk   9.6   7.1  3 X Average Risk  23.4   11.0        Use the calculated Patient Ratio above and the CHD Risk Table to determine the patient's CHD Risk.        ATP III CLASSIFICATION (LDL):  <100     mg/dL   Optimal  100-129  mg/dL   Near or Above                    Optimal  130-159  mg/dL   Borderline  160-189  mg/dL   High  >190     mg/dL   Very High Performed at [facility] [address] [place], [place] [address]   Triglycerides     Status: None   Collection Time: 06/10/21  6:00 AM  Result  Value Ref Range   Triglycerides 49 <150 mg/dL    Comment: Performed at [facility] [address] [place], [place] [address]  CBC     Status: None   Collection Time: 06/11/21  8:46 AM  Result Value Ref Range   WBC 8.3 4.0 - 10.5 K/uL   RBC 4.14 3.87 - 5.11 MIL/uL   Hemoglobin 12.5 12.0 - 15.0 g/dL   HCT 37.4 36.0 - 46.0 %   MCV 90.3 80.0 - 100.0 fL   MCH 30.2 26.0 - 34.0 pg   MCHC 33.4 30.0 - 36.0 g/dL   RDW 14.5 11.5 - 15.5 %   Platelets 319 150 - 400 K/uL   nRBC 0.0 0.0 - 0.2 %    Comment: Performed at [facility], [address], [place], [place] [id number]  Basic metabolic panel     Status: Abnormal   Collection Time: 06/11/21  8:46 AM  Result Value Ref Range   Sodium 142 135 - 145 mmol/L   Potassium 3.3 (L) 3.5 - 5.1 mmol/L   Chloride 109 98 - 111 mmol/L   CO2 26 22 - 32 mmol/L   Glucose, Bld 81 70 - 99 mg/dL    Comment: Glucose reference range applies only to samples taken after fasting for at least 8 hours.   BUN 13 8 - 23 mg/dL   Creatinine, Ser 0.78 0.44 - 1.00 mg/dL   Calcium 8.7 (L) 8.9 - 10.3 mg/dL   GFR, Estimated >60 >60 mL/min    Comment: (NOTE) Calculated using the CKD-EPI Creatinine Equation (2021)    Anion gap 7 5 - 15    Comment: Performed at [facility] [address] [place], [place] [address]  Magnesium     Status: None   Collection Time: 06/11/21  8:46 AM  Result Value Ref Range   Magnesium 1.8 1.7 - 2.4 mg/dL    Comment: Performed at [facility] [address] [place], [place] [address]  Phosphorus     Status: Abnormal   Collection Time: 06/11/21  8:46 AM  Result Value Ref Range   Phosphorus 2.3 (L) 2.5 - 4.6 mg/dL    Comment: Performed at [facility] [address], [place],  [address]    Current Facility-Administered Medications  Medication Dose Route Frequency Provider Last Rate Last Admin    stroke: mapping our early stages of recovery book   Does not apply Once Donnetta Simpers, MD        0.9 %  sodium chloride infusion  250 mL Intravenous Continuous Donnetta Simpers, MD       acetaminophen (TYLENOL) tablet 650 mg  650 mg Oral Q4H PRN Deveshwar, Willaim Rayas, MD       Or   acetaminophen (TYLENOL) 160 MG/5ML solution 650 mg  650 mg Per Tube Q4H PRN Deveshwar, Willaim Rayas, MD       Or   acetaminophen (TYLENOL) suppository 650 mg  650 mg Rectal Q4H PRN Deveshwar, Willaim Rayas, MD       atorvastatin (LIPITOR) tablet 40 mg  40 mg Oral QHS Rosalin Hawking, MD   40 mg at 06/10/21 2235   Chlorhexidine Gluconate Cloth 2 % PADS 6 each  6 each Topical Daily Deveshwar, Willaim Rayas, MD   6 each at 06/11/21 1102   hydrALAZINE (APRESOLINE) injection 10 mg  10 mg Intravenous Q6H PRN Margaretmary Lombard, MD   10 mg at 06/11/21 [id number]   hydrALAZINE (APRESOLINE) tablet 50 mg  50 mg Oral BID Jacky Kindle, MD       potassium chloride 10 mEq in 100 mL IVPB  10 mEq Intravenous Q1 Hr x 4 Chand, Sudham, MD 100 mL/hr at 06/11/21 1344 10 mEq at 06/11/21 1344   QUEtiapine (SEROQUEL) tablet 50 mg  50 mg Oral QHS Rosalin Hawking, MD       rivaroxaban Alveda Reasons) tablet 20 mg  20 mg Oral Q supper Rosalin Hawking, MD   20 mg at 06/10/21 1604   senna-docusate (Senokot-S) tablet 1 tablet  1 tablet Oral QHS PRN Donnetta Simpers, MD        Musculoskeletal: Strength & Muscle Tone: within normal limits Gait & Station:  UTA; patient lying in bed Patient leans: N/A    Psychiatric Specialty Exam:  Presentation  General Appearance: Appropriate for Environment  Eye Contact:Fair  Speech:Clear and Coherent  Speech Volume:Normal  Handedness: No data recorded  Mood and Affect  Mood:Irritable  Affect:Congruent   Thought Process  Thought Processes:-- (Limited by dementia)  Descriptions of Associations:-- (Interview limited by dementia)  Orientation:None  Thought Content:Other (comment) (UTA d/t dementia)  History of Schizophrenia/Schizoaffective disorder:No data recorded Duration of Psychotic Symptoms:No data  recorded Hallucinations:Hallucinations: Other (comment) (UTA d/t dementia)  Ideas of Reference:None  Suicidal Thoughts:Suicidal Thoughts: No  Homicidal Thoughts:Homicidal Thoughts: No   Sensorium  Memory:Immediate Poor; Recent Poor; Remote Poor  Judgment:Other (comment) (UTA d/t dementia)  Insight:Other (comment) (UTA d/t dementia)   Executive Functions  Concentration:Poor  Attention Span:Fair  Recall:Other (comment) (UTA d/t dementia)  Fund of Knowledge:Other (comment) (UTA d/t dementia)  Language:Other (comment) (UTA d/t dementia)   Psychomotor Activity  Psychomotor Activity:Psychomotor Activity: Normal   Assets  Assets:Social Support   Sleep  Sleep:Sleep: Fair   Physical Exam: Physical Exam Vitals reviewed.  HENT:     Head: Normocephalic and atraumatic.  Eyes:     Extraocular Movements: Extraocular movements intact.  Cardiovascular:     Rate and Rhythm: Normal rate.  Pulmonary:     Effort: Pulmonary effort is normal.  Musculoskeletal:     Cervical back: Normal range of motion.  Neurological:     Mental Status: She is alert. She is disoriented.  Psychiatric:        Mood and Affect: Mood normal.        Behavior: Behavior normal.   Review of Systems  Unable to perform ROS: Dementia  Blood pressure (!) 162/100, pulse 65, temperature 99 F (37.2 C), temperature source Oral, resp. rate 11, SpO2 99 %. There is no height or weight on file to calculate BMI.  Treatment Plan Summary: Annella is an 80 year old female with a history of chronic A Fib who is s/p mech thrombectomy, s/p extubation on 8/7 and s/p Precedex (which she was unable to tolerate- bradycardia to 30s) consulted to the psychiatric service for agitation in the setting of dementia.  She is currently taking Seroquel 50 mg nightly.  Her QTC is 497, and she is mildly hypokalemic (3.3), which can possibly prolong the QTc.  Recommendation: -Replenish potassium, and recheck EKG.  -If Qtc is <460 on  recheck, increase Seroquel to 25 mg in the AM, 25 mg at noon, and 50 mg QHS. -Order for delirium precautions submitted  Psychiatry will sign off at this time. Please do not hesitate to call back if questions arise. Thank you for this consult!     Disposition: Patient does not meet criteria for psychiatric inpatient admission.  Rosezetta Schlatter, MD 06/11/2021 1:50 PM

## 2021-06-11 NOTE — Telephone Encounter (Signed)
Spoke with Marlowe Kays to provide the OK for verbal orders for patient per Dr.Johnson. Per Marlowe Kays from [facility], she states they received an e-mail stating that patient was hospitalized as patient had another stroke Saturday. Dr.Johnson was made aware and verbalized understanding.

## 2021-06-11 NOTE — Progress Notes (Signed)
Inpatient Rehab Admissions Coordinator:   Note pt agitated today with psych consult pending.  Will f/u tomorrow for rehab assessment.   Shann Medal, PT, DPT Admissions Coordinator [phone] 06/11/21  1:50 PM

## 2021-06-11 NOTE — Progress Notes (Signed)
NAME:  [name], MRN:  [id number], DOB:  17-Jan-1941, LOS: 2 ADMISSION DATE:  06/09/2021, CONSULTATION DATE: 06/09/2021 REFERRING MD: Dr. Estanislado Pandy, CHIEF COMPLAINT: Aphasia and right-sided weakness  History of Present Illness:  80 year old female with chronic atrial fibrillation on Xarelto which was recently taken off because of hematuria who presented to [facility] with acute onset of aphasia and right-sided weakness.  Stroke code was called, CT head noncontrast was negative, CTA head and neck showed left M2 occlusion.  Patient was not given tPA due to recent stroke and gross hematuria, she underwent mechanical thrombectomy with TICI 3 revascularization. Postprocedure patient remained intubated, PCCM was consulted for help with management.  Significant Hospital Events: Including procedures, antibiotic start and stop dates in addition to other pertinent events   8/6 admitted, underwent mechanical thrombectomy and intubated 8/7: Tolerated pressure support trial, extubated this morning  Interim History / Subjective:  Patient was agitated overnight despite surgical therapy, requiring IV Precedex infusion with that patient's heart rate dropped into 30s, Precedex was stopped She is refusing to eat  Objective   Blood pressure (!) 170/131, pulse 86, temperature 99 F (37.2 C), temperature source Oral, resp. rate (!) 27, SpO2 96 %.        Intake/Output Summary (Last 24 hours) at 06/11/2021 1046 Last data filed at 06/11/2021 0500 Gross per 24 hour  Intake 173 ml  Output 250 ml  Net -77 ml   There were no vitals filed for this visit.  Examination:   Physical exam: General: Acutely ill-appearing elderly Caucasian female, lying on the bed, agitated HEENT: [place]/AT, eyes anicteric.  Moist mucous membranes Neuro: Alert, awake, agitated, moving all 4 extremities spontaneously.  Intermittently following commands Chest: Coarse breath sounds, no wheezes or rhonchi Heart: Irregularly irregular, no murmurs or  gallops Abdomen: Soft, nontender, nondistended, bowel sounds present Skin: No rash   Resolved Hospital Problem list   Acute respiratory insufficiency postprocedure  Assessment & Plan:  Acute left MCA stroke status post mechanical thrombectomy with TICI 3 Chronic atrial fibrillation, restarted back on Xarelto Hypertension Acute hyperactive delirium Hypokalemia/hypomagnesemia  Continue neuro watch every hour Stroke team is following Continue secondary stroke prophylaxis Continue lovastatin and started on Xarelto Heart rate is well controlled Patient became bradycardic after Precedex infusion at rate of 0.1-0.2, it was stopped Continue with Seroquel at night Continue aggressive electrolyte supplements   Best Practice (right click and "Reselect all SmartList Selections" daily)   Diet/type: Dysphagia diet DVT prophylaxis: Xarelto GI prophylaxis: N/A Lines: N/A Foley:  N/A Code Status:  full code Last date of multidisciplinary goals of care discussion [Per primary team]  Labs   CBC: Recent Labs  Lab 06/09/21 0854 06/09/21 1456 06/09/21 1501 06/10/21 0559 06/11/21 0846  WBC 5.4 7.4  --  6.2 8.3  NEUTROABS 3.2  --   --  5.0  --   HGB 13.8 15.2* 13.6 12.3 12.5  HCT 40.8 45.8 40.0 37.4 37.4  MCV 90.5 90.2  --  90.1 90.3  PLT 317 293  --  314 [id number]    Basic Metabolic Panel: Recent Labs  Lab 06/09/21 0854 06/09/21 1456 06/09/21 1501 06/09/21 2354 06/11/21 0846  NA 142  --  141 136 142  K 4.0  --  3.6 3.6 3.3*  CL 107  --   --  105 109  CO2 28  --   --  21* 26  GLUCOSE 94  --   --  133* 81  BUN 12  --   --  11 13  CREATININE 0.88 0.74  --  0.58 0.78  CALCIUM 9.3  --   --  8.7* 8.7*  MG  --   --   --   --  1.8  PHOS  --   --   --   --  2.3*   GFR: Estimated Creatinine Clearance: 63 mL/min (by C-G formula based on SCr of 0.78 mg/dL). Recent Labs  Lab 06/09/21 0854 06/09/21 1456 06/10/21 0559 06/11/21 0846  WBC 5.4 7.4 6.2 8.3    Liver Function  Tests: Recent Labs  Lab 06/09/21 0854  AST 20  ALT 11  ALKPHOS 76  BILITOT 1.1  PROT 7.3  ALBUMIN 3.8   No results for input(s): LIPASE, AMYLASE in the last 168 hours. No results for input(s): AMMONIA in the last 168 hours.  ABG    Component Value Date/Time   PHART 7.353 06/09/2021 1501   PCO2ART 46.2 06/09/2021 1501   PO2ART 129 (H) 06/09/2021 1501   HCO3 25.7 06/09/2021 1501   TCO2 27 06/09/2021 1501   O2SAT 99.0 06/09/2021 1501     Coagulation Profile: Recent Labs  Lab 06/09/21 0854  INR 1.0    Cardiac Enzymes: No results for input(s): CKTOTAL, CKMB, CKMBINDEX, TROPONINI in the last 168 hours.  HbA1C: HB A1C (BAYER DCA - WAIVED)  Date/Time Value Ref Range Status  01/05/2021 10:55 AM 5.4 <7.0 % Final    Comment:                                          Diabetic Adult            <7.0                                       Healthy Adult        4.3 - 5.7                                                           (DCCT/NGSP) American Diabetes Association's Summary of Glycemic Recommendations for Adults with Diabetes: Hemoglobin A1c <7.0%. More stringent glycemic goals (A1c <6.0%) may further reduce complications at the cost of increased risk of hypoglycemia.    Hgb A1c MFr Bld  Date/Time Value Ref Range Status  06/10/2021 05:59 AM 5.3 4.8 - 5.6 % Final    Comment:    (NOTE) Pre diabetes:          5.7%-6.4%  Diabetes:              >6.4%  Glycemic control for   <7.0% adults with diabetes   03/30/2021 05:15 AM 5.5 4.8 - 5.6 % Final    Comment:    (NOTE)         Prediabetes: 5.7 - 6.4         Diabetes: >6.4         Glycemic control for adults with diabetes: <7.0     CBG: Recent Labs  Lab 06/09/21 0852 06/09/21 1047  GLUCAP 82 75    Total critical care time: 32 minutes  Performed by: Trumann  care time was exclusive of separately billable procedures and treating other patients.   Critical care was necessary to treat or prevent  imminent or life-threatening deterioration.   Critical care was time spent personally by me on the following activities: development of treatment plan with patient and/or surrogate as well as nursing, discussions with consultants, evaluation of patient's response to treatment, examination of patient, obtaining history from patient or surrogate, ordering and performing treatments and interventions, ordering and review of laboratory studies, ordering and review of radiographic studies, pulse oximetry and re-evaluation of patient's condition.   Jacky Kindle MD [facility] See Amion for pager If no response to pager, please call [phone] until 7pm After 7pm, Please call E-link [phone]

## 2021-06-11 NOTE — Progress Notes (Signed)
eLink Physician-Brief Progress Note Patient Name: [name] DOB: Dec 25, 1940 MRN: [id number]   Date of Service  06/11/2021  HPI/Events of Note  HTN , HR is in 96s. Off precedex. No distress and HR has been in 40s earlier as well per RN  eICU Interventions  PRN hydralaZine      Intervention Category Major Interventions: Hypertension - evaluation and management  Margaretmary Lombard 06/11/2021, 5:13 AM

## 2021-06-12 ENCOUNTER — Other Ambulatory Visit: Payer: Self-pay

## 2021-06-12 DIAGNOSIS — I639 Cerebral infarction, unspecified: Secondary | ICD-10-CM | POA: Diagnosis not present

## 2021-06-12 DIAGNOSIS — G210 Malignant neuroleptic syndrome: Secondary | ICD-10-CM | POA: Diagnosis not present

## 2021-06-12 DIAGNOSIS — J9589 Other postprocedural complications and disorders of respiratory system, not elsewhere classified: Secondary | ICD-10-CM | POA: Diagnosis not present

## 2021-06-12 DIAGNOSIS — I6602 Occlusion and stenosis of left middle cerebral artery: Secondary | ICD-10-CM | POA: Diagnosis not present

## 2021-06-12 DIAGNOSIS — I63512 Cerebral infarction due to unspecified occlusion or stenosis of left middle cerebral artery: Secondary | ICD-10-CM | POA: Diagnosis not present

## 2021-06-12 DIAGNOSIS — Z20822 Contact with and (suspected) exposure to covid-19: Secondary | ICD-10-CM | POA: Diagnosis not present

## 2021-06-12 DIAGNOSIS — Z006 Encounter for examination for normal comparison and control in clinical research program: Secondary | ICD-10-CM | POA: Diagnosis not present

## 2021-06-12 DIAGNOSIS — I63412 Cerebral infarction due to embolism of left middle cerebral artery: Secondary | ICD-10-CM | POA: Diagnosis not present

## 2021-06-12 LAB — CBC
HCT: 34.7 % — ABNORMAL LOW (ref 36.0–46.0)
Hemoglobin: 11.5 g/dL — ABNORMAL LOW (ref 12.0–15.0)
MCH: 30 pg (ref 26.0–34.0)
MCHC: 33.1 g/dL (ref 30.0–36.0)
MCV: 90.6 fL (ref 80.0–100.0)
Platelets: 289 10*3/uL (ref 150–400)
RBC: 3.83 MIL/uL — ABNORMAL LOW (ref 3.87–5.11)
RDW: 14.7 % (ref 11.5–15.5)
WBC: 7 10*3/uL (ref 4.0–10.5)
nRBC: 0 % (ref 0.0–0.2)

## 2021-06-12 LAB — BASIC METABOLIC PANEL
Anion gap: 6 (ref 5–15)
BUN: 15 mg/dL (ref 8–23)
CO2: 23 mmol/L (ref 22–32)
Calcium: 8.5 mg/dL — ABNORMAL LOW (ref 8.9–10.3)
Chloride: 110 mmol/L (ref 98–111)
Creatinine, Ser: 0.79 mg/dL (ref 0.44–1.00)
GFR, Estimated: >60 mL/min (ref >60)
Glucose, Bld: 99 mg/dL (ref 70–99)
Potassium: 3.8 mmol/L (ref 3.5–5.1)
Sodium: 139 mmol/L (ref 135–145)

## 2021-06-12 MED ORDER — QUETIAPINE FUMARATE 25 MG PO TABS
25.0000 mg | ORAL_TABLET | Freq: Every day | ORAL | Status: DC
  Administered 2021-06-13: 25 mg via ORAL
  Filled 2021-06-12: qty 1

## 2021-06-12 MED ORDER — QUETIAPINE FUMARATE 25 MG PO TABS
25.0000 mg | ORAL_TABLET | Freq: Every day | ORAL | Status: DC
  Administered 2021-06-12: 25 mg via ORAL
  Filled 2021-06-12: qty 1

## 2021-06-12 NOTE — Progress Notes (Addendum)
STROKE TEAM PROGRESS NOTE   SUBJECTIVE (INTERVAL HISTORY) This morning she is up in her bed eating breakfast. Nursing is at bedside- day time RN states that she slept overnight- did not fall asleep until 2 AM.   EKG ordered to evaluate QTC given initiation of Seroquel- QTC 467 ms. Will trend- Seroquel 25 mg QD initiated today in addition to night time dose of Seroquel 50 mg.   She participates in the neurological exam today and follows commands.   OBJECTIVE Temp:  [98.9 F (37.2 C)-99.1 F (37.3 C)] 99.1 F (37.3 C) (08/09 1204) Pulse Rate:  [56-101] 68 (08/09 1200) Cardiac Rhythm: Atrial fibrillation (08/09 1200) Resp:  [14-27] 21 (08/09 1300) BP: (79-168)/(44-114) 121/100 (08/09 1300) SpO2:  [96 %-100 %] 98 % (08/09 1200) Weight:  [92.3 kg] 92.3 kg (08/09 0743)  Recent Labs  Lab 06/09/21 0852 06/09/21 1047  GLUCAP 82 75   Recent Labs  Lab 06/09/21 0854 06/09/21 1456 06/09/21 1501 06/09/21 2354 06/11/21 0846 06/12/21 0339  NA 142  --  141 136 142 139  K 4.0  --  3.6 3.6 3.3* 3.8  CL 107  --   --  105 109 110  CO2 28  --   --  21* 26 23  GLUCOSE 94  --   --  133* 81 99  BUN 12  --   --  '11 13 15  '$ CREATININE 0.88 0.74  --  0.58 0.78 0.79  CALCIUM 9.3  --   --  8.7* 8.7* 8.5*  MG  --   --   --   --  1.8  --   PHOS  --   --   --   --  2.3*  --    Recent Labs  Lab 06/09/21 0854  AST 20  ALT 11  ALKPHOS 76  BILITOT 1.1  PROT 7.3  ALBUMIN 3.8   Recent Labs  Lab 06/09/21 0854 06/09/21 1456 06/09/21 1501 06/10/21 0559 06/11/21 0846 06/12/21 0339  WBC 5.4 7.4  --  6.2 8.3 7.0  NEUTROABS 3.2  --   --  5.0  --   --   HGB 13.8 15.2* 13.6 12.3 12.5 11.5*  HCT 40.8 45.8 40.0 37.4 37.4 34.7*  MCV 90.5 90.2  --  90.1 90.3 90.6  PLT 317 293  --  314 319 289   No results for input(s): CKTOTAL, CKMB, CKMBINDEX, TROPONINI in the last 168 hours. No results for input(s): LABPROT, INR in the last 72 hours.  No results for input(s): COLORURINE, LABSPEC, [place],  GLUCOSEU, HGBUR, BILIRUBINUR, KETONESUR, PROTEINUR, UROBILINOGEN, NITRITE, LEUKOCYTESUR in the last 72 hours.  Invalid input(s): APPERANCEUR     Component Value Date/Time   CHOL 86 06/10/2021 0559   CHOL 168 01/05/2021 1056   TRIG 49 06/10/2021 0600   HDL 48 06/10/2021 0559   HDL 72 01/05/2021 1056   CHOLHDL 1.8 06/10/2021 0559   VLDL 8 06/10/2021 0559   LDLCALC 30 06/10/2021 0559   LDLCALC 80 01/05/2021 1056   Lab Results  Component Value Date   HGBA1C 5.3 06/10/2021   No results found for: LABOPIA, COCAINSCRNUR, LABBENZ, AMPHETMU, THCU, LABBARB  No results for input(s): ETH in the last 168 hours.  I have personally reviewed the radiological images below and agree with the radiology interpretations.  MR BRAIN WO CONTRAST  Result Date: 06/10/2021 CLINICAL DATA:  Neuro deficit, acute, stroke suspected EXAM: MRI HEAD WITHOUT CONTRAST TECHNIQUE: Multiplanar, multiecho pulse sequences of the brain and surrounding  structures were obtained without intravenous contrast. COMPARISON:  None. FINDINGS: Brain: Multifocal acute ischemia within the right internal capsule, left frontal white matter and left insula. Old left occipital infarct and old left frontal infarct. Bilateral confluent hyperintense T2-weighted signal in the white matter. Normal volume of CSF spaces. No chronic microhemorrhage. Normal midline structures. Vascular: Major flow voids are preserved. Skull and upper cervical spine: Normal calvarium and skull base. Visualized upper cervical spine and soft tissues are normal. Sinuses/Orbits:No paranasal sinus fluid levels or advanced mucosal thickening. No mastoid or middle ear effusion. Normal orbits. IMPRESSION: 1. Multifocal acute ischemia within the right internal capsule, left frontal white matter and left insula. No hemorrhage or mass effect. 2. 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 Electronically Signed   By: Ulyses Jarred M.D.   On: 06/10/2021 01:52   IR CT Head  Ltd  Result Date: 06/12/2021 INDICATION: New onset of global aphasia. EXAM: 1. EMERGENT LARGE VESSEL OCCLUSION THROMBOLYSIS (anterior CIRCULATION) COMPARISON:  CT angiogram of head and neck of June 09, 2021. MEDICATIONS: Ancef 2 g IV antibiotic was administered within 1 hour of the procedure. ANESTHESIA/SEDATION: General anesthesia. CONTRAST:  Omnipaque 300 approximately 100 mL. FLUOROSCOPY TIME:  Fluoroscopy Time: 64 minutes 50 seconds (1384 mGy). COMPLICATIONS: None immediate. TECHNIQUE: Following a full explanation of the procedure along with the potential associated complications, an informed witnessed consent was obtained. The risks of intracranial hemorrhage of 10%, worsening neurological deficit, ventilator dependency, death and inability to revascularize were all reviewed in detail with the patient's daughter. The patient was then put under general anesthesia by the Department of Anesthesiology at [facility]. The right groin was prepped and draped in the usual sterile fashion. Thereafter using modified Seldinger technique, transfemoral access into the right common femoral artery was obtained without difficulty. Over a 0.035 inch guidewire an 8 [place] Pinnacle 25 cm sheath was inserted. Through this, and also over a 0.035 inch guidewire a 5.5 [place] Simmons 2 support catheter inside of an 088 TracStar guide catheter combination was advanced to the aortic arch region. A Simmons 2 support catheter was then advanced over the guidewire to the mid cervical left ICA by the TracStar guide catheter. The support catheter and the wire were removed. Good aspiration was obtained from the hub of the TracStar guide catheter. FINDINGS: A gentle control arteriogram performed through this demonstrated no evidence of dissections or spasm, or of intraluminal filling defects. More distally, the left internal carotid artery in the petrous, cavernous and supraclinoid segments demonstrate wide patency. The left anterior  cerebral artery opacifies into the capillary and venous phases. The left middle cerebral artery M1 segment demonstrates wide patency. Complete angiographic occlusion was seen of the dominant inferior division in the mid M2 segment. The superior division remained widely patent into the capillary and venous phases. PROCEDURE: Over a 0.014 inch standard Synchro micro guidewire with a J-tip configuration, a 162 cm 021 Trevo trak inside of a 55 132 cm Zoom aspiration catheter combination was advanced to the supraclinoid left ICA. The micro guidewire was then gently advanced through the occluded inferior division into the M2 M3 region followed by the microcatheter. The guidewire was removed. Good aspiration obtained from the hub of the microcatheter. A gentle control arteriogram performed through the microcatheter demonstrated good antegrade flow. This was then connected to continuous heparinized saline infusion. A 4 mm x 40 mm Solitaire X retrieval device was then advanced to the distal end of the microcatheter. The O ring on the delivery  microcatheter was loosened. With slight forward gentle traction with the right hand on the delivery micro guidewire with the left hand the microcatheter was retrieved deploying the retrieval device proximal and just proximal to the occluded inferior division. At this time the 055 Zoom aspiration catheter was advanced and engaged in the proximal inferior division. Thereafter, constant aspiration was applied with a 20 mL syringe at the hub of an 088 Zoom TrakStar catheter, and with Penumbra aspiration device at the hub of the Zoom aspiration catheter for approximately 2-1/2 minutes. Thereafter, the combination of the microcatheter retrieval device, and the Zoom aspiration catheter were retrieved and removed. A control arteriogram performed through 088 guide catheter in the left internal carotid artery demonstrated revascularization of the previously occluded inferior division with patency  of the superior division maintained. A TICI 3 revascularization was obtained. The left anterior cerebral artery circulation demonstrated wide patency. The Zoom 088 catheter was retrieved into the common carotid artery. A control arteriogram performed through this demonstrated patency of the left external carotid artery and its major branches. The left internal carotid artery at the bulb and extracranially appeared widely patent without evidence of intraluminal filling defects or of occlusions or dissections. The 088 Zoom catheter was removed. Hemostasis at the right groin puncture site was sealed with a 6 French Angio-Seal closure device. Distal pulses remained Dopplerable in both feet unchanged. CT of the brain demonstrated no evidence of intracranial hemorrhage or mass effect or midline shift. Patient was left intubated awaiting the COVID test. Patient was transferred to neuro ICU for post revascularization care. IMPRESSION: Status post endovascular complete revascularization of occluded dominant inferior division of the left middle cerebral artery with 1 pass with a 4 mm x 40 mm Solitaire X retrieval device, and contact aspiration achieving a TICI 3 revascularization. PLAN: Follow up as per referring MD. Electronically Signed   By: Luanne Bras M.D.   On: 06/11/2021 11:18   DG CHEST PORT 1 VIEW  Result Date: 06/09/2021 CLINICAL DATA:  ET tube placement EXAM: PORTABLE CHEST 1 VIEW COMPARISON:  03/31/2021 FINDINGS: Endotracheal tube is 2 cm above the carina. NG tube enters the stomach. Cardiomegaly. Lungs clear. No effusions or edema. Aortic atherosclerosis. No acute bony abnormality. IMPRESSION: ET tube 2 cm above the carina. Cardiomegaly.  Aortic atherosclerosis. No active disease. Electronically Signed   By: Rolm Baptise M.D.   On: 06/09/2021 22:00   VAS [place] GROIN PSEUDOANEURYSM  Result Date: 06/10/2021  ARTERIAL PSEUDOANEURYSM  Patient Name:  [name]  Date of Exam:   06/10/2021 Medical Rec #:  [id number]     Accession #:    [id number] Date of Birth: 1940-11-12      Patient Gender: F Patient Age:   47 years Exam Location:  [facility] Procedure:      VAS [place] Gloriajean Dell Referring Phys: Durenda Guthrie --------------------------------------------------------------------------------  Exam: Right groin Indications: Patient complains of "mild hardening" noted under RT CFA puncture site per MD. History: S/p IR procedure. Comparison Study: no previous exams Performing Technologist: Jody Hill RVT, RDMS  Examination Guidelines: A complete evaluation includes B-mode imaging, spectral Doppler, color Doppler, and power Doppler as needed of all accessible portions of each vessel. Bilateral testing is considered an integral part of a complete examination. Limited examinations for reoccurring indications may be performed as noted. +------------+----------+--------+------+----------+ Right DuplexPSV (cm/s)WaveformPlaqueComment(s) +------------+----------+--------+------+----------+ CFA            139    biphasic                 +------------+----------+--------+------+----------+  PFA            132    biphasic                 +------------+----------+--------+------+----------+ Prox SFA       139    biphasic                 +------------+----------+--------+------+----------+  Summary: No evidence of pseudoaneurysm, AVF or DVT  Diagnosing physician: Jamelle Haring Electronically signed by Jamelle Haring on 06/10/2021 at 2:04:52 PM.   --------------------------------------------------------------------------------    Final    ECHOCARDIOGRAM COMPLETE  Result Date: 06/10/2021    ECHOCARDIOGRAM REPORT   Patient Name:   [facility] Date of Exam: 06/10/2021 Medical Rec #:  [id number]    Height:       65.0 in Accession #:    [id number]   Weight:       203.5 lb Date of Birth:  11-07-1940     BSA:          1.993 m Patient Age:    80 years     BP:           134/59 mmHg Patient Gender: F            HR:            77 bpm. Exam Location:  Inpatient Procedure: 2D Echo, Cardiac Doppler, Color Doppler and 3D Echo Indications:    Stroke I63.9  History:        Patient has prior history of Echocardiogram examinations, most                 recent 03/31/2021. Signs/Symptoms:Altered Mental Status.  Sonographer:    Merrie Roof RDCS Referring Phys: [id number] [place]  1. Left ventricular ejection fraction, by estimation, is 65 to 70%. The left ventricle has normal function. The left ventricle has no regional wall motion abnormalities. There is mild eccentric left ventricular hypertrophy of the posterior-lateral segment. Left ventricular diastolic parameters are indeterminate.  2. Right ventricular systolic function is normal. The right ventricular size is normal. There is normal pulmonary artery systolic pressure. The estimated right ventricular systolic pressure is 99991111 mmHg.  3. Left atrial size was severely dilated.  4. Right atrial size was moderately dilated.  5. The mitral valve is degenerative. Mild to moderate mitral valve regurgitation. No evidence of mitral stenosis. Moderate mitral annular calcification.  6. The aortic valve is grossly normal. There is mild calcification of the aortic valve. Aortic valve regurgitation is not visualized. Mild aortic valve sclerosis is present, with no evidence of aortic valve stenosis.  7. The inferior vena cava is normal in size with greater than 50% respiratory variability, suggesting right atrial pressure of 3 mmHg. Conclusion(s)/Recommendation(s): No intracardiac source of embolism detected on this transthoracic study. A transesophageal echocardiogram can be considered to exclude cardiac source of embolism if clinically indicated. FINDINGS  Left Ventricle: Left ventricular ejection fraction, by estimation, is 65 to 70%. The left ventricle has normal function. The left ventricle has no regional wall motion abnormalities. 3D left ventricular ejection fraction analysis  performed but not reported based on interpreter judgement due to suboptimal quality. The left ventricular internal cavity size was normal in size. There is mild eccentric left ventricular hypertrophy of the posterior-lateral segment. Left ventricular diastolic parameters are indeterminate. Right Ventricle: The right ventricular size is normal. No increase in right ventricular wall thickness. Right ventricular systolic function is normal. There is normal pulmonary artery systolic pressure.  The tricuspid regurgitant velocity is 2.65 m/s, and  with an assumed right atrial pressure of 3 mmHg, the estimated right ventricular systolic pressure is 99991111 mmHg. Left Atrium: Left atrial size was severely dilated. Right Atrium: Right atrial size was moderately dilated. Pericardium: There is no evidence of pericardial effusion. Mitral Valve: The mitral valve is degenerative in appearance. Moderate mitral annular calcification. Mild to moderate mitral valve regurgitation. No evidence of mitral valve stenosis. The mean mitral valve gradient is 2.3 mmHg with average heart rate of 64 bpm. Tricuspid Valve: The tricuspid valve is normal in structure. Tricuspid valve regurgitation is mild . No evidence of tricuspid stenosis. Aortic Valve: The aortic valve is grossly normal. There is mild calcification of the aortic valve. Aortic valve regurgitation is not visualized. Mild aortic valve sclerosis is present, with no evidence of aortic valve stenosis. Aortic valve mean gradient  measures 8.0 mmHg. Aortic valve peak gradient measures 15.2 mmHg. Aortic valve area, by VTI measures 2.28 cm. Pulmonic Valve: The pulmonic valve was normal in structure. Pulmonic valve regurgitation is trivial. No evidence of pulmonic stenosis. Aorta: The aortic root is normal in size and structure. Venous: The inferior vena cava is normal in size with greater than 50% respiratory variability, suggesting right atrial pressure of 3 mmHg. IAS/Shunts: The  interatrial septum was not well visualized.  LEFT VENTRICLE PLAX 2D LVIDd:         6.40 cm LVIDs:         4.00 cm LV PW:         1.20 cm LV IVS:        0.80 cm LVOT diam:     2.00 cm  3D Volume EF: LV SV:         91       3D EF:        71 % LV SV Index:   46       LV EDV:       142 ml LVOT Area:     3.14 cm LV ESV:       41 ml                         LV SV:        100 ml RIGHT VENTRICLE RV Basal diam:  3.90 cm LEFT ATRIUM              Index        RIGHT ATRIUM           Index LA diam:        6.20 cm  3.11 cm/m   RA Area:     27.20 cm LA Vol (A2C):   175.0 ml 87.82 ml/m  RA Volume:   73.60 ml  36.93 ml/m LA Vol (A4C):   252.0 ml 126.46 ml/m LA Biplane Vol: 220.0 ml 110.40 ml/m  AORTIC VALVE AV Area (Vmax):    2.17 cm AV Area (Vmean):   2.25 cm AV Area (VTI):     2.28 cm AV Vmax:           195.00 cm/s AV Vmean:          128.000 cm/s AV VTI:            0.401 m AV Peak Grad:      15.2 mmHg AV Mean Grad:      8.0 mmHg LVOT Vmax:         135.00 cm/s LVOT Vmean:  91.800 cm/s LVOT VTI:          0.291 m LVOT/AV VTI ratio: 0.73  AORTA Ao Root diam: 3.30 cm MITRAL VALVE           TRICUSPID VALVE MV Mean grad: 2.3 mmHg TR Peak grad:   28.1 mmHg                        TR Vmax:        265.00 cm/s                         SHUNTS                        Systemic VTI:  0.29 m                        Systemic Diam: 2.00 cm Cherlynn Kaiser MD Electronically signed by Cherlynn Kaiser MD Signature Date/Time: 06/10/2021/4:27:03 PM    Final    IR PERCUTANEOUS ART THROMBECTOMY/INFUSION INTRACRANIAL INC DIAG ANGIO  Result Date: 06/12/2021 INDICATION: New onset of global aphasia. EXAM: 1. EMERGENT LARGE VESSEL OCCLUSION THROMBOLYSIS (anterior CIRCULATION) COMPARISON:  CT angiogram of head and neck of June 09, 2021. MEDICATIONS: Ancef 2 g IV antibiotic was administered within 1 hour of the procedure. ANESTHESIA/SEDATION: General anesthesia. CONTRAST:  Omnipaque 300 approximately 100 mL. FLUOROSCOPY TIME:  Fluoroscopy Time: 64  minutes 50 seconds (1384 mGy). COMPLICATIONS: None immediate. TECHNIQUE: Following a full explanation of the procedure along with the potential associated complications, an informed witnessed consent was obtained. The risks of intracranial hemorrhage of 10%, worsening neurological deficit, ventilator dependency, death and inability to revascularize were all reviewed in detail with the patient's daughter. The patient was then put under general anesthesia by the Department of Anesthesiology at [facility]. The right groin was prepped and draped in the usual sterile fashion. Thereafter using modified Seldinger technique, transfemoral access into the right common femoral artery was obtained without difficulty. Over a 0.035 inch guidewire an 8 [place] Pinnacle 25 cm sheath was inserted. Through this, and also over a 0.035 inch guidewire a 5.5 [place] Simmons 2 support catheter inside of an 088 TracStar guide catheter combination was advanced to the aortic arch region. A Simmons 2 support catheter was then advanced over the guidewire to the mid cervical left ICA by the TracStar guide catheter. The support catheter and the wire were removed. Good aspiration was obtained from the hub of the TracStar guide catheter. FINDINGS: A gentle control arteriogram performed through this demonstrated no evidence of dissections or spasm, or of intraluminal filling defects. More distally, the left internal carotid artery in the petrous, cavernous and supraclinoid segments demonstrate wide patency. The left anterior cerebral artery opacifies into the capillary and venous phases. The left middle cerebral artery M1 segment demonstrates wide patency. Complete angiographic occlusion was seen of the dominant inferior division in the mid M2 segment. The superior division remained widely patent into the capillary and venous phases. PROCEDURE: Over a 0.014 inch standard Synchro micro guidewire with a J-tip configuration, a 162 cm 021 Trevo  trak inside of a 55 132 cm Zoom aspiration catheter combination was advanced to the supraclinoid left ICA. The micro guidewire was then gently advanced through the occluded inferior division into the M2 M3 region followed by the microcatheter. The guidewire was removed. Good aspiration obtained from the hub of the microcatheter.  A gentle control arteriogram performed through the microcatheter demonstrated good antegrade flow. This was then connected to continuous heparinized saline infusion. A 4 mm x 40 mm Solitaire X retrieval device was then advanced to the distal end of the microcatheter. The O ring on the delivery microcatheter was loosened. With slight forward gentle traction with the right hand on the delivery micro guidewire with the left hand the microcatheter was retrieved deploying the retrieval device proximal and just proximal to the occluded inferior division. At this time the 055 Zoom aspiration catheter was advanced and engaged in the proximal inferior division. Thereafter, constant aspiration was applied with a 20 mL syringe at the hub of an 088 Zoom TrakStar catheter, and with Penumbra aspiration device at the hub of the Zoom aspiration catheter for approximately 2-1/2 minutes. Thereafter, the combination of the microcatheter retrieval device, and the Zoom aspiration catheter were retrieved and removed. A control arteriogram performed through 088 guide catheter in the left internal carotid artery demonstrated revascularization of the previously occluded inferior division with patency of the superior division maintained. A TICI 3 revascularization was obtained. The left anterior cerebral artery circulation demonstrated wide patency. The Zoom 088 catheter was retrieved into the common carotid artery. A control arteriogram performed through this demonstrated patency of the left external carotid artery and its major branches. The left internal carotid artery at the bulb and extracranially appeared  widely patent without evidence of intraluminal filling defects or of occlusions or dissections. The 088 Zoom catheter was removed. Hemostasis at the right groin puncture site was sealed with a 6 French Angio-Seal closure device. Distal pulses remained Dopplerable in both feet unchanged. CT of the brain demonstrated no evidence of intracranial hemorrhage or mass effect or midline shift. Patient was left intubated awaiting the COVID test. Patient was transferred to neuro ICU for post revascularization care. IMPRESSION: Status post endovascular complete revascularization of occluded dominant inferior division of the left middle cerebral artery with 1 pass with a 4 mm x 40 mm Solitaire X retrieval device, and contact aspiration achieving a TICI 3 revascularization. PLAN: Follow up as per referring MD. Electronically Signed   By: Luanne Bras M.D.   On: 06/11/2021 11:18   CT HEAD CODE STROKE WO CONTRAST  Result Date: 06/09/2021 CLINICAL DATA:  Code stroke. Right-sided weakness and slurred speech EXAM: CT HEAD WITHOUT CONTRAST TECHNIQUE: Contiguous axial images were obtained from the base of the skull through the vertex without intravenous contrast. COMPARISON:  03/30/2021 brain MRI FINDINGS: Brain: No evidence of acute infarction, hemorrhage, hydrocephalus, extra-axial collection or mass lesion/mass effect. Remote left occipital and lateral frontal cortex and white matter infarcts, moderate in size. Confluent chronic small vessel ischemia in the hemispheric white matter. No visible acute infarct. Stable sulcal calcification along the right frontal convexity. Vascular: Best seen on reformats is a branching hyperdensity in the left M2 region. CTA is pending. Atherosclerosis Skull: No acute finding Sinuses/Orbits: No acute finding Other: These results were communicated to Dr Quinn Axe at 9:09 am on 06/09/2021 by text page via the [facility] messaging system. IMPRESSION: 1. Hyperdense left M2 branch with no hemorrhage or  visible acute infarct. 2. Chronic small vessel ischemia with remote left MCA and PCA branch infarcts. Electronically Signed   By: Monte Fantasia M.D.   On: 06/09/2021 09:10   CT ANGIO HEAD NECK W WO CM W PERF (CODE STROKE)  Addendum Date: 06/09/2021   ADDENDUM REPORT: 06/09/2021 09:32 ADDENDUM: Critical Value/emergent results were called by telephone at the time  of interpretation on 06/09/2021 at 9:32 am to provider [facility] , who verbally acknowledged these results. Electronically Signed   By: Monte Fantasia M.D.   On: 06/09/2021 09:32   Result Date: 06/09/2021 CLINICAL DATA:  Stroke suspected. EXAM: CT ANGIOGRAPHY HEAD AND NECK CT PERFUSION BRAIN TECHNIQUE: Multidetector CT imaging of the head and neck was performed using the standard protocol during bolus administration of intravenous contrast. Multiplanar CT image reconstructions and MIPs were obtained to evaluate the vascular anatomy. Carotid stenosis measurements (when applicable) are obtained utilizing NASCET criteria, using the distal internal carotid diameter as the denominator. Multiphase CT imaging of the brain was performed following IV bolus contrast injection. Subsequent parametric perfusion maps were calculated using RAPID software. CONTRAST:  Dose is not currently none on this in progress study. COMPARISON:  Head CT from earlier the same day FINDINGS: CTA NECK FINDINGS Aortic arch: Atheromatous plaque.  Three vessel branching. Right carotid system: Mild plaque at the bifurcation without stenosis or ulceration. Left carotid system: Mild calcified plaque at the bifurcation without stenosis or ulceration Vertebral arteries: No proximal subclavian atherosclerosis. Codominant vertebral arteries that are tortuous but widely patent to the dura. Skeleton: Advanced cervical spine degeneration with particularly severe right-sided C1-2 facet degeneration with bulky spurring impinging on the right C2 nerve root. C2-3 and C3-4 facet ankylosis. Other  neck: No acute finding Upper chest: No acute finding Review of the MIP images confirms the above findings CTA HEAD FINDINGS Anterior circulation: Atheromatous plaque at the bilateral carotid siphon. Distal left M2 branch cut off with some downstream reconstitution. No additional branch occlusion is seen. Negative for aneurysm Posterior circulation: Vertebral and basilar arteries are smoothly contoured and widely patent. No branch occlusion, beading, or aneurysm. Venous sinuses: Diffusely patent Anatomic variants: None significant Review of the MIP images confirms the above findings CT Brain Perfusion Findings: ASPECTS: 10 when accounting for chronic changes CBF (<30%) Volume: 30m Perfusion (Tmax>6.0s) volume: 538mMismatch Volume: 5175m page and call has been placed to neurology. IMPRESSION: 1. Emergent left M2 branch occlusion with penumbra pattern on CT perfusion involving a 51 cc area. 2. Mild for age atherosclerosis without flow limiting stenosis or visible embolic source. Electronically Signed: By: JonMonte FantasiaD. On: 06/09/2021 09:28     PHYSICAL EXAM  Temp:  [98.9 F (37.2 C)-99.1 F (37.3 C)] 99.1 F (37.3 C) (08/09 1204) Pulse Rate:  [56-101] 68 (08/09 1200) Resp:  [14-27] 21 (08/09 1300) BP: (79-168)/(44-114) 121/100 (08/09 1300) SpO2:  [96 %-100 %] 98 % (08/09 1200) Weight:  [92.3 kg] 92.3 kg (08/09 0743)  General - Well nourished, well developed, agitated and combative, trying to get out of bed, in chest posey restrain  Ophthalmologic - fundi not visualized due to noncooperation.  Cardiovascular - irregularly irregular heart rate and rhythm.  Neuro - MS: Oriented to self only, follows commands with coaching Speech: States her name and speaks in short sentences. Does not participate with naming and repetition  CN: EOMI, VFF, Face symmetric, Tongue midline Motor: Normal bulk and tone. Antigravity throughout. Does not participate in strength testing.  Sensation: Appears  intact to light touch throughout Coordination: UTA  Gait: Deferred   ASSESSMENT/PLAN Ms. MarDhyana Oesterlenor is a 80 50o. female with history of afib off Xarelto due to recent hematuria, HTN, dementia, stroke in 03/2021, NMS with haldol admitted for aphasia and right sided weakness. No tPA given due to recent hematuria and stroke within 3 months.    Stroke:  right IC,  left insular cortex and left frontal infarcts, embolic pattern, secondary to AFib off Xarelto due to hematuria CT head no acute finding CTA head and neck left M2 occlusion CTP 0/51, positive for penumbra MRI Right IC, left insular cortex and left frontal infarcts 2D Echo EF 65-70% LDL 30 HgbA1c 5.3 lovenox for VTE prophylaxis Xarelto (rivaroxaban) daily (off due to hematuria) prior to admission, now resumed Xarelto Patient family counseled to be compliant with her antithrombotic medications Ongoing aggressive stroke risk factor management Therapy recommendations:  pending Disposition:  pending   Hematuria  Per family, only lasted 2 days with dark urine PCP put Xarelto on hold until sees urologist Urologist saw her last week, put on Cipro for 3 days and recommend to resume Xarelto after cipro course finished Pt about to start Xarelto today per original schedule Resumed Xarelto 8/8  Hx of stroke 03/2021 left MCA scattered punctate infarcts, MRA and CUS neg, EF 60-65%, LDL 73, A1C 5.5. stroke etiology was due to noncompliance of Xarelto which was resumed on discharge.  Developed NMS after haldol use during 03/2021 admission. Had ICU monitoring, ativan PRN and no dantrolene needed.   Afib Off home Xarelto due to recent hematuria Urologist plan to start Xarelto today Now on ASA 81 -> Xarelto  Dementia  No official diagnosis yet due to frequent cancellation/rescheduling of neurology appointment Currently pending appointment in 07/2021 Cognitive impairment at home with sundowning, needing 24/7 supervision. Needs help for bathing,  but able to eat and dress by herself but slow and sometimes needs help. Now on posey restrain, agitated and combative Continue Seroquel 25 mg QD and Seroquel 50 mg HS  Psychiatry consulted, appreciate help  Hypertension Stable On hydralazine 50 bid Long term BP goal normotensive  Hyperlipidemia Home meds:  lipitor 40  LDL 30, goal < 70 On lipitor 40 Continue statin at discharge  Other Stroke Risk Factors Advanced age   Ruta Hinds, NP  Stroke Service Nurse Practitioner Patient seen and discussed with attending physician Dr. Princess Bruins day # 3  ATTENDING NOTE: I reviewed above note and agree with the assessment and plan. Pt was seen and examined.   Pt no acute event overnight. Slept since 2am per RN and she woke up this am without agitation or combative. Awake alert and following simple commands. EKG repeat showed QTc 467, will add seroquel '25mg'$  during the day, continue the seroquel '50mg'$  Qhs. PT/OT recommend CIR.   For detailed assessment and plan, please refer to above as I have made changes wherever appropriate.   Rosalin Hawking, MD PhD Stroke Neurology 06/12/2021 5:39 PM  This patient is critically ill due to stroke status post thrombectomy, A. fib, dementia with behavioral disturbance and at significant risk of neurological worsening, death form recurrent stroke, hemorrhagic conversion, seizure, in hospital delirium, heart failure. This patient's care requires constant monitoring of vital signs, hemodynamics, respiratory and cardiac monitoring, review of multiple databases, neurological assessment, discussion with family, other specialists and medical decision making of high complexity. I spent 35 minutes of neurocritical care time in the care of this patient.     To contact Stroke Continuity provider, please refer to [email or web address] After hours, contact General Neurology

## 2021-06-12 NOTE — Progress Notes (Signed)
OT NOTE  Pt noted to have decreased BP during session. Pt requires two choices to help pt answer questions. Pt unaware of location but when given option are you home? States NO. Pt able to correctly choose hospital. Pt progressed with RW with cues for safety not to abandon RW min (A). Recommendations remain CIR at this time. No family present to confirm d/c (A).   06/12/21 1200  OT Visit Information  Last OT Received On 06/12/21  Assistance Needed +1  PT/OT/SLP Co-Evaluation/Treatment Yes  Reason for Co-Treatment Necessary to address cognition/behavior during functional activity  History of Present Illness This 80 y.o. female admitted with acute onset Rt sided weakness and aphasia.  CTA showed acute L M2 oacclusion > thrombectomy.  MRI showed:  Multifocal acute ischemia within the right internal capsule, left frontal white matter and left insula. 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  PMH includes:  cat bite with cellulitis Lt Lower leg, HTN, h/o CVAs, mild dementia  Precautions  Precautions Fall  Precaution Comments sundowns, posey, foley  Pain Assessment  Pain Assessment No/denies pain  Cognition  Arousal/Alertness Awake/alert  Behavior During Therapy WFL for tasks assessed/performed  Overall Cognitive Status Impaired/Different from baseline  Area of Impairment Orientation;Memory;Attention;Following commands;Safety/judgement;Awareness  Orientation Level Disoriented to;Place;Time;Situation  Current Attention Level Sustained  Memory Decreased short-term memory  Following Commands Follows one step commands inconsistently  Safety/Judgement Decreased awareness of safety;Decreased awareness of deficits  Awareness Intellectual  General Comments Pt agitated, restless and combative. Oriented to self only. Increased verbalizations today, stating she wants to die and "why are you trying to kill me?" Pt swinging arms and legs at therapist stating, "I'm going to kick you in the nuts." Able to  name objects such as a pencil  ADL  Overall ADL's  Needs assistance/impaired  Eating/Feeding Set up;Sitting  Grooming Wash/dry face;Oral care;Brushing hair;Min guard;Standing  Grooming Details (indicate cue type and reason) pt requires UE support on counter surface and x1 LOB during static. pt needs cues to stand closer to sink and not back away from surface  Toilet Transfer Min guard;RW  Toilet Transfer Details (indicate cue type and reason) mod cues. simulated OOB to chair  Functional mobility during ADLs Minimal assistance;Rolling walker  General ADL Comments pt noted to have decreased BP during session  Bed Mobility  Overal bed mobility Needs Assistance  Bed Mobility Supine to Sit  Supine to sit Min assist  General bed mobility comments pt with hand over hand to reach for bed rail with L UE and exit R side of bed. pt able to progress with min (A) at the trunk to static sitting  Balance  Overall balance assessment Needs assistance  Sitting-balance support Feet supported  Sitting balance-[name] Fair  Standing balance support Single extremity supported;During functional activity  Standing balance-[name] Fair  Standing balance comment relies on UE support for stability  Transfers  Overall transfer level Needs assistance  Equipment used Rolling walker (2 wheeled)  Transfers Sit to/from Stand  Sit to Stand Min guard  General transfer comment min cues for safety, mod to max cues to not abandon RW. pt pushing to the side and lack of awareness to foley  General Comments  General comments (skin integrity, edema, etc.) BP decreased with MAP 64 - RN made aware. pt unaware but noted to have decreased word finding, repeating words and blinking more. pt with feet elevated and ankles pumped with return to conversation and tracking therapist  OT - End of Session  Equipment Utilized During Treatment Gait belt;Rolling walker  Activity Tolerance Patient tolerated treatment well  Patient  left in chair;with call bell/phone within reach;with chair alarm set;with restraints reapplied (posey, RN state to leave wrist restraints off)  Nurse Communication Mobility status;Precautions  OT Assessment/Plan  OT Plan Discharge plan remains appropriate  OT Visit Diagnosis Unsteadiness on feet (R26.81);Cognitive communication deficit (R41.841)  Symptoms and signs involving cognitive functions Cerebral infarction  OT Frequency (ACUTE ONLY) Min 2X/week  Recommendations for Other Services Rehab consult  Follow Up Recommendations CIR  OT Equipment None recommended by OT  AM-PAC OT "6 Clicks" Daily Activity Outcome Measure (Version 2)  Help from another person eating meals? 3  Help from another person taking care of personal grooming? 3  Help from another person toileting, which includes using toliet, bedpan, or urinal? 3  Help from another person bathing (including washing, rinsing, drying)? 3  Help from another person to put on and taking off regular upper body clothing? 3  Help from another person to put on and taking off regular lower body clothing? 2  6 Click Score 17  Progressive Mobility  What is the highest level of mobility based on the progressive mobility assessment? Level 5 (Walks with assist in room/hall) - Balance while stepping forward/back and can walk in room with assist - Complete  Mobility Ambulated with assistance in room  OT Goal Progression  Progress towards OT goals Progressing toward goals  Acute Rehab OT Goals  Patient Stated Goal none stated  OT Goal Formulation Patient unable to participate in goal setting  Time For Goal Achievement 06/24/21  Potential to Achieve Goals Good  ADL Goals  Pt Will Perform Eating with set-up;with supervision;sitting  Pt Will Perform Grooming with min guard assist;standing  Pt Will Perform Upper Body Bathing with min assist;sitting  Pt Will Perform Lower Body Bathing with min assist;sit to/from stand  Pt Will Perform Upper Body  Dressing with min assist;sitting  Pt Will Perform Lower Body Dressing with min assist;sit to/from stand  Pt Will Transfer to Toilet with min guard assist;ambulating;regular height toilet;bedside commode;grab bars  Pt Will Perform Toileting - Clothing Manipulation and hygiene with min guard assist;sit to/from stand  OT Time Calculation  OT Start Time (ACUTE ONLY) 1005  OT Stop Time (ACUTE ONLY) 1031  OT Time Calculation (min) 26 min  OT General Charges  $OT Visit 1 Visit  OT Treatments  $Self Care/Home Management  8-22 mins   Brynn, OTR/L  Acute Rehabilitation Services Pager: [phone] Office: [phone] .

## 2021-06-12 NOTE — Progress Notes (Signed)
NAME:  LARENDA FEAST, MRN:  [id number], DOB:  01/20/1941, LOS: 3 ADMISSION DATE:  06/09/2021, CONSULTATION DATE: 06/09/2021 REFERRING MD: Dr. Estanislado Pandy, CHIEF COMPLAINT: Aphasia and right-sided weakness  History of Present Illness:  80 year old female with chronic atrial fibrillation on Xarelto which was recently taken off because of hematuria who presented to [facility] with acute onset of aphasia and right-sided weakness.  Stroke code was called, CT head noncontrast was negative, CTA head and neck showed left M2 occlusion.  Patient was not given tPA due to recent stroke and gross hematuria, she underwent mechanical thrombectomy with TICI 3 revascularization. Postprocedure patient remained intubated, PCCM was consulted for help with management.  Significant Hospital Events: Including procedures, antibiotic start and stop dates in addition to other pertinent events   8/6 admitted, underwent mechanical thrombectomy and intubated 8/7: Tolerated pressure support trial, extubated this morning 8/9: Off precedex, on RA in NAD.   Interim History / Subjective:  Started on Seroquel, and is much calmer this morning. Following commands and answering questions. Also asking appropriate questions. She remains with Posey for safety. ? Sundowners as she is combative at night per nursing Objective   Blood pressure (!) 158/90, pulse 63, temperature 99 F (37.2 C), temperature source Oral, resp. rate 15, height '5\' 5"'$  (1.651 m), weight 92.3 kg, SpO2 100 %.        Intake/Output Summary (Last 24 hours) at 06/12/2021 0910 Last data filed at 06/12/2021 0600 Gross per 24 hour  Intake 2740.13 ml  Output 1800 ml  Net 940.13 ml   Filed Weights   06/12/21 0743  Weight: 92.3 kg    Examination:   Physical exam: General: Elderly Caucasian female, lying on the bed, calm and in NAD HEENT: [place]/AT, eyes anicteric.  Moist mucous membranes, No LAD, No JVD Neuro: Alert, awake, calm, moving all 4 extremities spontaneously.   Following commands, Cooperative Chest: Bilateral chest excursion, few rhonchi, diminished per bases Heart: Irregularly irregular, no murmurs or gallops, SB-SR per tele Abdomen: Soft, nontender, nondistended, bowel sounds present Skin: No rash, no lesions, intact  K 3.8, Mag was 1.8 on 8/8 and patient received 2 grams Creatinine 0.79, Net + 1117>> 1800 cc urine out over last 24 hours WBC is 7, HGB is 11.5 which is a 1 gram drop overnight ( 12.5) T Max 99 Platelets dropped 319>>289  Resolved Hospital Problem list   Acute respiratory insufficiency postprocedure  Assessment & Plan:  Acute left MCA stroke status post mechanical thrombectomy with TICI 3 Chronic atrial fibrillation, restarted back on Xarelto Hypertension Acute hyperactive delirium vs sundowners Hypokalemia/hypomagnesemia Bradycardia has resolved Plan Stroke team is following and primary Continue secondary stroke prophylaxis Continue lovastatin and  Xarelto Continue with Seroquel at night Frequent re-orientation Continue aggressive electrolyte supplements Will recheck Mag 8/10, Goal > 2.0  HGB drop 1 gram overnight ( 12.5>> 11.5) since re-starting  xaralto Baseline 11.6-13 Net + 1117, so doubt all dilutional No active bleeding/hematuria Plan CBC in am and prn Monitor for bleeding Transfuse as needed for HGB < 7  At risk pulmonary complication with immobility Strong cough Plan Aggressive pulmonary Toilet OOB to chair  Mobility per PT when cleared by neuro Flutter valve/ IS CXR prn Consider mucinex prn for thick secretions  Pt. is no longer intubated and she is off all drips. PCCM will transition medical care to [facility] starting 8/10   Best Practice (right click and "Reselect all SmartList Selections" daily)   Diet/type: Dysphagia diet DVT prophylaxis: Xarelto GI prophylaxis: N/A  Lines: N/A Foley:  N/A Code Status:  full code Last date of multidisciplinary goals of care discussion [Per primary  team]  Labs   CBC: Recent Labs  Lab 06/09/21 0854 06/09/21 1456 06/09/21 1501 06/10/21 0559 06/11/21 0846 06/12/21 0339  WBC 5.4 7.4  --  6.2 8.3 7.0  NEUTROABS 3.2  --   --  5.0  --   --   HGB 13.8 15.2* 13.6 12.3 12.5 11.5*  HCT 40.8 45.8 40.0 37.4 37.4 34.7*  MCV 90.5 90.2  --  90.1 90.3 90.6  PLT 317 293  --  314 319 [id number]    Basic Metabolic Panel: Recent Labs  Lab 06/09/21 0854 06/09/21 1456 06/09/21 1501 06/09/21 2354 06/11/21 0846 06/12/21 0339  NA 142  --  141 136 142 139  K 4.0  --  3.6 3.6 3.3* 3.8  CL 107  --   --  105 109 110  CO2 28  --   --  21* 26 23  GLUCOSE 94  --   --  133* 81 99  BUN 12  --   --  '11 13 15  '$ CREATININE [id number] 0.74  --  0.58 0.78 0.79  CALCIUM 9.3  --   --  8.7* 8.7* 8.5*  MG  --   --   --   --  1.8  --   PHOS  --   --   --   --  2.3*  --    GFR: Estimated Creatinine Clearance: 63 mL/min (by C-G formula based on SCr of 0.79 mg/dL). Recent Labs  Lab 06/09/21 1456 06/10/21 0559 06/11/21 0846 06/12/21 0339  WBC 7.4 6.2 8.3 7.0    Liver Function Tests: Recent Labs  Lab 06/09/21 0854  AST 20  ALT 11  ALKPHOS 76  BILITOT 1.1  PROT 7.3  ALBUMIN 3.8   No results for input(s): LIPASE, AMYLASE in the last 168 hours. No results for input(s): AMMONIA in the last 168 hours.  ABG    Component Value Date/Time   PHART 7.353 06/09/2021 1501   PCO2ART 46.2 06/09/2021 1501   PO2ART 129 (H) 06/09/2021 1501   HCO3 25.7 06/09/2021 1501   TCO2 27 06/09/2021 1501   O2SAT 99.0 06/09/2021 1501     Coagulation Profile: Recent Labs  Lab 06/09/21 0854  INR 1.0    Cardiac Enzymes: No results for input(s): CKTOTAL, CKMB, CKMBINDEX, TROPONINI in the last 168 hours.  HbA1C: HB A1C (BAYER DCA - WAIVED)  Date/Time Value Ref Range Status  01/05/2021 10:55 AM 5.4 <7.0 % Final    Comment:                                          Diabetic Adult            <7.0                                       Healthy Adult        4.3 - 5.7                                                            (  DCCT/NGSP) American Diabetes Association's Summary of Glycemic Recommendations for Adults with Diabetes: Hemoglobin A1c <7.0%. More stringent glycemic goals (A1c <6.0%) may further reduce complications at the cost of increased risk of hypoglycemia.    Hgb A1c MFr Bld  Date/Time Value Ref Range Status  06/10/2021 05:59 AM 5.3 4.8 - 5.6 % Final    Comment:    (NOTE) Pre diabetes:          5.7%-6.4%  Diabetes:              >6.4%  Glycemic control for   <7.0% adults with diabetes   03/30/2021 05:15 AM 5.5 4.8 - 5.6 % Final    Comment:    (NOTE)         Prediabetes: 5.7 - 6.4         Diabetes: >6.4         Glycemic control for adults with diabetes: <7.0     CBG: Recent Labs  Lab 06/09/21 0852 06/09/21 1047  GLUCAP 82 75    Total critical care time: 32 minutes  Performed by: Magdalen Spatz, AGACNP-BC   Critical care was time spent personally by me on the following activities: development of treatment plan with patient and/or surrogate as well as nursing, discussions with consultants, evaluation of patient's response to treatment, examination of patient, obtaining history from patient or surrogate, ordering and performing treatments and interventions, ordering and review of laboratory studies, ordering and review of radiographic studies, pulse oximetry and re-evaluation of patient's condition.   Magdalen Spatz, MSN, AGACNP-BC [place] for personal pager PCCM on call pager [phone] >> Hospital Use only After 7pm, Please call E-link [phone] Hospital use only 06/12/2021 9:11 AM

## 2021-06-12 NOTE — Anesthesia Postprocedure Evaluation (Signed)
Anesthesia Post Note  Patient: [name]  Procedure(s) Performed: IR WITH ANESTHESIA     Patient location during evaluation: SICU Anesthesia Type: General Level of consciousness: sedated and patient remains intubated per anesthesia plan Pain management: pain level controlled Vital Signs Assessment: post-procedure vital signs reviewed and stable Respiratory status: patient on ventilator - see flowsheet for VS Cardiovascular status: stable Anesthetic complications: no   No notable events documented.  Last Vitals:  Vitals:   06/12/21 0700 06/12/21 0800  BP: (!) 145/71 (!) 158/90  Pulse: 61 63  Resp: 15 15  Temp:  37.2 C  SpO2: 98% 100%    Last Pain:  Vitals:   06/12/21 0800  TempSrc: Oral  PainSc: [place]

## 2021-06-12 NOTE — Progress Notes (Signed)
  Speech Language Pathology Treatment: Cognitive-Linquistic  Patient Details Name: [name] MRN: [id number] DOB: [birth year] Today's Date: 06/12/2021 Time: [id number] SLP Time Calculation (min) (ACUTE ONLY): 12 min  Assessment / Plan / Recommendation Clinical Impression  Pt's communication is better than when initially evaluated on 8/07.  She demonstrated much improved ability to follow one-step commands (90%); was able to produce automatic sequences and engage in conversation about her family growing up (unable to determine accuracy, but ability to handle the reciprocity of communication was improved). She named basic objects with 70% accuracy.  Sentences are fluent; however, content is marked by issues related to short and long-term memory. No family present - it will be helpful to establish cognitive function PTA. SLP will follow.   HPI HPI: [name] is a 80 y.o. female with PMH significant for A. fib, recently taken off Xarelto for gross hematuria, hypertension, dementia (sundowning and requiring 24 hour supervision), multiple prior ischemic stroke with most recent on 5/26 who presented to [facility] emergency department with acute onset aphasia and right-sided weakness. CTA was obtained with left MCA M2 occlusion with 51 cc penumbra. Patient was transferred to [facility] for thrombectomy.  MRI of the brain was showing multifocal acute ischemia within the right internal capsule, left frontal white matter and left insula as well as old left occipital and frontal infarcts.  She was intubated briefly.  Per RN family reported onset of cognitive issues in May.  She was scheduled to see a neurologist.      [place] with current plan of care       Recommendations                   Oral Care Recommendations: Oral care BID Follow up Recommendations: Inpatient Rehab SLP Visit Diagnosis: Cognitive communication deficit [id number]) Plan: Continue with current plan of  care       GO              [name] L. [name], [place] Office number [phone] Pager [phone]   [name] 06/12/2021, 2:08 PM

## 2021-06-12 NOTE — Progress Notes (Signed)
Foley catheter removed by the patient.  Ruta Hinds, NP notified.  Will not immediately replace, monitor UOP and straight cath as necessary.

## 2021-06-12 NOTE — Progress Notes (Signed)
Inpatient Rehab Admissions Coordinator:   Met with patient at bedside.  She is pleasant and answers questions, though question accuracy as she states she hadn't worked with PT yet (she had).  I was able to speak to her daughter in law, [name], regarding CIR recommendations.  Per [name], pt has been living with her and her husband (pt's son) for the last month since she left [facility].  They had developed a routine, which pt did well with.  Pt does have significant cognitive deficits at baseline, mobilizing with RW and supervision, and assistance for all ADLs.  Plan is for pt to return to [name]'s home at discharge from rehab with 24/7 care.  Between her, pt's son, 2 daughters, and hired sitters pt had 24/7 care at baseline.  I reviewed that [facility] requires prior authorization and if pt is admitted to CIR, the only option is to d/c home (not to SNF).  [name] verbalizes understanding and wishes to proceed. I will start insurance auth today.    Shann Medal, PT, DPT Admissions Coordinator [phone] 06/12/21  2:37 PM

## 2021-06-12 NOTE — Progress Notes (Addendum)
Physical Therapy Treatment Patient Details Name: [name] MRN: [id number] DOB: 09-03-1941 Today's Date: 06/12/2021    History of Present Illness This 80 y.o. female admitted with acute onset Rt sided weakness and aphasia.  CTA showed acute L M2 oacclusion > thrombectomy.  MRI showed:  Multifocal acute ischemia within the right internal capsule, left frontal white matter and left insula. 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  PMH includes:  cat bite with cellulitis Lt Lower leg, HTN, h/o CVAs, mild dementia    PT Comments    Pt much more calm and cooperative during therapy session today; follows the majority of one step commands. Pt requiring min assist (+2 safety) for functional mobility. Ambulating to bathroom with a walker where she was able to participate in brushing teeth and combing hair with set up assist. Pt reporting some dizziness when directly asked. Assisted back to recliner; BP 89/70 (75). Reclined chair, with subsequent BP reading of 104/47 (64). Notified RN. Continue to recommend comprehensive inpatient rehab (CIR) for post-acute therapy needs.   Follow Up Recommendations  CIR;Supervision/Assistance - 24 hour     Equipment Recommendations  Rolling walker with 5" wheels;3in1 (PT);Wheelchair (measurements PT);Wheelchair cushion (measurements PT)    Recommendations for Other Services Rehab consult     Precautions / Restrictions Precautions Precautions: Fall Precaution Comments: sundowns, posey, watch BP Restrictions Weight Bearing Restrictions: No    Mobility  Bed Mobility Overal bed mobility: Needs Assistance Bed Mobility: Supine to Sit     Supine to sit: Min assist     General bed mobility comments: Light minA for trunk guidance to upright, cues for scooting forward to edge of bed    Transfers Overall transfer level: Needs assistance Equipment used: Rolling walker (2 wheeled) Transfers: Sit to/from Stand Sit to Stand: Min assist          General transfer comment: MinA to boost up to standing position  Ambulation/Gait Ambulation/Gait assistance: Min assist;+2 safety/equipment Gait Distance (Feet): 30 Feet (~10 ft to bathroom, ~20 ft in room) Assistive device: Rolling walker (2 wheeled) Gait Pattern/deviations: Step-to pattern;Decreased stride length;Trunk flexed Gait velocity: decreased Gait velocity interpretation: <1.8 ft/sec, indicate of risk for recurrent falls General Gait Details: Pt with dynamic instability, requiring minA for balance, poor proximity to RW and cues provided for sequencing/direction   Stairs             Wheelchair Mobility    Modified Rankin (Stroke Patients Only) Modified Rankin (Stroke Patients Only) Pre-Morbid Rankin Score: No symptoms Modified Rankin: Moderate disability     Balance Overall balance assessment: Needs assistance Sitting-balance support: Feet supported Sitting balance-[name]: Fair     Standing balance support: Single extremity supported;During functional activity Standing balance-[name]: Poor Standing balance comment: Requiring min guard with single UE support at sink, minA with no UE support at sink when applying toothpaste to toothbrush. Mild left lateral lean                            Cognition Arousal/Alertness: Awake/alert Behavior During Therapy: WFL for tasks assessed/performed Overall Cognitive Status: Impaired/Different from baseline Area of Impairment: Orientation;Memory;Attention;Following commands;Safety/judgement;Awareness                 Orientation Level: Disoriented to;Place;Time;Situation Current Attention Level: Sustained Memory: Decreased short-term memory Following Commands: Follows one step commands inconsistently Safety/Judgement: Decreased awareness of safety;Decreased awareness of deficits Awareness: Intellectual Problem Solving: Difficulty sequencing;Requires verbal cues General Comments: Pt much  more  calm and cooperative today, follows 1 step commands fairly consistently with occasional repetition and set up for tasks such as brushing teeth at sink and combing hair. Decreased orientation and short term memory recall. Unable to verbalize symptoms such as dizziness in setting of hypotension.      Exercises      General Comments        Pertinent Vitals/Pain Pain Assessment: Faces Faces Pain Scale: No hurt    Home Living                      Prior Function            PT Goals (current goals can now be found in the care plan section) Acute Rehab PT Goals Patient Stated Goal: for her to return to baseline PT Goal Formulation: Patient unable to participate in goal setting Time For Goal Achievement: 06/24/21 Potential to Achieve Goals: Good Progress towards PT goals: Progressing toward goals    Frequency    Min 4X/week      PT Plan Current plan remains appropriate    Co-evaluation PT/OT/SLP Co-Evaluation/Treatment: Yes Reason for Co-Treatment: Necessary to address cognition/behavior during functional activity;For patient/therapist safety;Other (comment);To address functional/ADL transfers (in setting of combativeness yesterday 8/8) PT goals addressed during session: Mobility/safety with mobility;Balance        AM-PAC PT "6 Clicks" Mobility   Outcome Measure  Help needed turning from your back to your side while in a flat bed without using bedrails?: A Little Help needed moving from lying on your back to sitting on the side of a flat bed without using bedrails?: A Little Help needed moving to and from a bed to a chair (including a wheelchair)?: A Little Help needed standing up from a chair using your arms (e.g., wheelchair or bedside chair)?: A Little Help needed to walk in hospital room?: A Little Help needed climbing 3-5 steps with a railing? : A Lot 6 Click Score: 17    End of Session Equipment Utilized During Treatment: Gait belt Activity Tolerance:  Patient tolerated treatment well Patient left: with call bell/phone within reach;with restraints reapplied;in chair;with chair alarm set Nurse Communication: Mobility status PT Visit Diagnosis: Unsteadiness on feet (R26.81);Other abnormalities of gait and mobility (R26.89);Difficulty in walking, not elsewhere classified (R26.2);Other symptoms and signs involving the nervous system (R29.898)     Time: [id number] PT Time Calculation (min) (ACUTE ONLY): 28 min  Charges:  $Therapeutic Activity: 8-22 mins                     Wyona Almas, PT, DPT Acute Rehabilitation Services Pager [phone] Office [phone]    Deno Etienne 06/12/2021, 12:11 PM

## 2021-06-13 DIAGNOSIS — I6602 Occlusion and stenosis of left middle cerebral artery: Secondary | ICD-10-CM | POA: Diagnosis not present

## 2021-06-13 DIAGNOSIS — F0391 Unspecified dementia with behavioral disturbance: Secondary | ICD-10-CM | POA: Diagnosis not present

## 2021-06-13 DIAGNOSIS — I63512 Cerebral infarction due to unspecified occlusion or stenosis of left middle cerebral artery: Secondary | ICD-10-CM | POA: Diagnosis not present

## 2021-06-13 LAB — CBC WITH DIFFERENTIAL/PLATELET
Abs Immature Granulocytes: 0.03 10*3/uL (ref 0.00–0.07)
Basophils Absolute: 0 10*3/uL (ref 0.0–0.1)
Basophils Relative: 0 %
Eosinophils Absolute: 0.2 10*3/uL (ref 0.0–0.5)
Eosinophils Relative: 2 %
HCT: 37.9 % (ref 36.0–46.0)
Hemoglobin: 12.5 g/dL (ref 12.0–15.0)
Immature Granulocytes: 0 %
Lymphocytes Relative: 21 %
Lymphs Abs: 1.6 10*3/uL (ref 0.7–4.0)
MCH: 30 pg (ref 26.0–34.0)
MCHC: 33 g/dL (ref 30.0–36.0)
MCV: 91.1 fL (ref 80.0–100.0)
Monocytes Absolute: 0.7 10*3/uL (ref 0.1–1.0)
Monocytes Relative: 8 %
Neutro Abs: 5.5 10*3/uL (ref 1.7–7.7)
Neutrophils Relative %: 69 %
Platelets: 308 10*3/uL (ref 150–400)
RBC: 4.16 MIL/uL (ref 3.87–5.11)
RDW: 14.9 % (ref 11.5–15.5)
WBC: 7.9 10*3/uL (ref 4.0–10.5)
nRBC: 0 % (ref 0.0–0.2)

## 2021-06-13 LAB — COMPREHENSIVE METABOLIC PANEL
ALT: 15 U/L (ref 0–44)
AST: 23 U/L (ref 15–41)
Albumin: 3 g/dL — ABNORMAL LOW (ref 3.5–5.0)
Alkaline Phosphatase: 58 U/L (ref 38–126)
Anion gap: 8 (ref 5–15)
BUN: 17 mg/dL (ref 8–23)
CO2: 26 mmol/L (ref 22–32)
Calcium: 8.6 mg/dL — ABNORMAL LOW (ref 8.9–10.3)
Chloride: 105 mmol/L (ref 98–111)
Creatinine, Ser: 0.81 mg/dL (ref 0.44–1.00)
GFR, Estimated: >60 mL/min (ref >60)
Glucose, Bld: 111 mg/dL — ABNORMAL HIGH (ref 70–99)
Potassium: 3.7 mmol/L (ref 3.5–5.1)
Sodium: 139 mmol/L (ref 135–145)
Total Bilirubin: 0.6 mg/dL (ref 0.3–1.2)
Total Protein: 5.9 g/dL — ABNORMAL LOW (ref 6.5–8.1)

## 2021-06-13 LAB — MAGNESIUM: Magnesium: 1.8 mg/dL (ref 1.7–2.4)

## 2021-06-13 MED ORDER — QUETIAPINE FUMARATE 25 MG PO TABS
25.0000 mg | ORAL_TABLET | Freq: Every morning | ORAL | Status: DC
  Administered 2021-06-14: 25 mg via ORAL
  Filled 2021-06-13: qty 1

## 2021-06-13 MED ORDER — QUETIAPINE FUMARATE 25 MG PO TABS: 25.0000 mg | ORAL_TABLET | Freq: Every day | ORAL | Status: DC

## 2021-06-13 MED ORDER — QUETIAPINE FUMARATE 25 MG PO TABS: 25.0000 mg | ORAL_TABLET | Freq: Once | ORAL | Status: DC

## 2021-06-13 MED ORDER — QUETIAPINE FUMARATE 25 MG PO TABS: 25.0000 mg | ORAL_TABLET | Freq: Every morning | ORAL | Status: DC

## 2021-06-13 MED ORDER — QUETIAPINE FUMARATE 25 MG PO TABS
25.0000 mg | ORAL_TABLET | Freq: Every day | ORAL | Status: DC
  Administered 2021-06-13 – 2021-06-14 (×2): 25 mg via ORAL
  Filled 2021-06-13: qty 1

## 2021-06-13 MED ORDER — LORAZEPAM 2 MG/ML IJ SOLN
1.0000 mg | Freq: Four times a day (QID) | INTRAMUSCULAR | Status: DC | PRN
  Administered 2021-06-13 – 2021-06-22 (×7): 1 mg via INTRAVENOUS
  Filled 2021-06-13 (×9): qty 1

## 2021-06-13 NOTE — Progress Notes (Signed)
Physical Therapy Treatment Patient Details Name: [name] MRN: [id number] DOB: November 02, 1941 Today's Date: 06/13/2021    History of Present Illness This 80 y.o. female admitted with acute onset Rt sided weakness and aphasia.  CTA showed acute L M2 oacclusion > thrombectomy.  MRI showed:  Multifocal acute ischemia within the right internal capsule, left frontal white matter and left insula. 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  PMH includes:  cat bite with cellulitis Lt Lower leg, HTN, h/o CVAs, mild dementia    PT Comments    Pt pleasantly confused; requires increased, multimodal cues for redirection today. Pt requiring two person moderate assist for transfers and room ambulation using a walker. Attempted to encourage her to empty her bladder by sitting on toilet, however, pt did not seem aware of her surroundings. Pt needing manual assist for steering walker and negotiating environment/obstacles. Continue to recommend comprehensive inpatient rehab (CIR) for post-acute therapy needs.     Follow Up Recommendations  CIR;Supervision/Assistance - 24 hour     Equipment Recommendations  Rolling walker with 5" wheels;3in1 (PT);Wheelchair (measurements PT);Wheelchair cushion (measurements PT)    Recommendations for Other Services Rehab consult     Precautions / Restrictions Precautions Precautions: Fall Precaution Comments: posey, wrist restraints, watch BP Restrictions Weight Bearing Restrictions: No    Mobility  Bed Mobility Overal bed mobility: Needs Assistance Bed Mobility: Supine to Sit     Supine to sit: Min assist     General bed mobility comments: Pt already with BLE's off left side of bed, assisted trunk to upright position    Transfers Overall transfer level: Needs assistance Equipment used: Rolling walker (2 wheeled) Transfers: Sit to/from Stand Sit to Stand: Mod assist;+2 physical assistance         General transfer comment: ModA + 2 to boost up to  standing position from edge of bed and toilet  Ambulation/Gait Ambulation/Gait assistance: +2 safety/equipment;Mod assist Gait Distance (Feet): 30 Feet Assistive device: Rolling walker (2 wheeled) Gait Pattern/deviations: Decreased stride length;Trunk flexed;Step-through pattern;Shuffle Gait velocity: decreased Gait velocity interpretation: <1.8 ft/sec, indicate of risk for recurrent falls General Gait Details: Pt with shuffling gait, decreased bilateral foot clearance, poor proximity to RW. Requiring modA + 2 for balance, cues for sequencing/direction, environmental negotiation   Stairs             Wheelchair Mobility    Modified Rankin (Stroke Patients Only) Modified Rankin (Stroke Patients Only) Pre-Morbid Rankin Score: No symptoms Modified Rankin: Moderately severe disability     Balance Overall balance assessment: Needs assistance Sitting-balance support: Feet supported Sitting balance-[name]: Fair     Standing balance support: Single extremity supported;During functional activity Standing balance-[name]: Poor Standing balance comment: Requiring at least single UE support                            Cognition Arousal/Alertness: Awake/alert Behavior During Therapy: WFL for tasks assessed/performed Overall Cognitive Status: Impaired/Different from baseline Area of Impairment: Orientation;Memory;Attention;Following commands;Safety/judgement;Awareness                 Orientation Level: Disoriented to;Place;Time;Situation Current Attention Level: Focused;Sustained Memory: Decreased short-term memory Following Commands: Follows one step commands inconsistently Safety/Judgement: Decreased awareness of safety;Decreased awareness of deficits Awareness: Intellectual Problem Solving: Difficulty sequencing;Requires verbal cues General Comments: Pt attempting to get out of bed upon arrival. No agitation or combativeness noted, however, decreased  command following noted in comparison to yesterday. Pt very easily distracted,  pulling at call bell on wall in bathroom consistently and requires max multimodal cues for environmental negotiation      Exercises      General Comments        Pertinent Vitals/Pain Pain Assessment: Faces Faces Pain Scale: No hurt    Home Living                      Prior Function            PT Goals (current goals can now be found in the care plan section) Acute Rehab PT Goals Patient Stated Goal: for her to return to baseline PT Goal Formulation: Patient unable to participate in goal setting Time For Goal Achievement: 06/24/21 Potential to Achieve Goals: Good Progress towards PT goals: Progressing toward goals    Frequency    Min 4X/week      PT Plan Current plan remains appropriate    Co-evaluation              AM-PAC PT "6 Clicks" Mobility   Outcome Measure  Help needed turning from your back to your side while in a flat bed without using bedrails?: A Little Help needed moving from lying on your back to sitting on the side of a flat bed without using bedrails?: A Little Help needed moving to and from a bed to a chair (including a wheelchair)?: A Lot Help needed standing up from a chair using your arms (e.g., wheelchair or bedside chair)?: A Lot Help needed to walk in hospital room?: A Lot Help needed climbing 3-5 steps with a railing? : A Lot 6 Click Score: 14    End of Session Equipment Utilized During Treatment: Gait belt Activity Tolerance: Patient tolerated treatment well Patient left: with call bell/phone within reach;with restraints reapplied;in chair;with chair alarm set Nurse Communication: Mobility status PT Visit Diagnosis: Unsteadiness on feet (R26.81);Other abnormalities of gait and mobility (R26.89);Difficulty in walking, not elsewhere classified (R26.2);Other symptoms and signs involving the nervous system (R29.898)     Time: [id number] PT Time  Calculation (min) (ACUTE ONLY): 19 min  Charges:  $Therapeutic Activity: 8-22 mins                     Wyona Almas, PT, DPT Acute Rehabilitation Services Pager [phone] Office [phone]     Deno Etienne 06/13/2021, 4:21 PM

## 2021-06-13 NOTE — Progress Notes (Addendum)
STROKE TEAM PROGRESS NOTE   SUBJECTIVE (INTERVAL HISTORY)  Per nursing, she slept well overnight. She is slightly agitated this morning.   Have added a morning dose of Seroquel per Psychiatry recommendations.   Repeat EKG ordered for tomorrow morning to follow up on QTC.   Will continue to monitor urine output given she removed her foley on 8/10.   OBJECTIVE Temp:  [98.6 F (37 C)-99.3 F (37.4 C)] 99.3 F (37.4 C) (08/10 0400) Pulse Rate:  [56-68] 64 (08/10 0000) Cardiac Rhythm: Atrial fibrillation (08/09 2000) Resp:  [15-26] 23 (08/10 0600) BP: (74-175)/(48-100) 141/77 (08/10 0600) SpO2:  [95 %-100 %] 100 % (08/10 0400)  Recent Labs  Lab 06/09/21 0852 06/09/21 1047  GLUCAP 82 75   Recent Labs  Lab 06/09/21 0854 06/09/21 1456 06/09/21 1501 06/09/21 2354 06/11/21 0846 06/12/21 0339 06/13/21 0350  NA 142  --  141 136 142 139 139  K 4.0  --  3.6 3.6 3.3* 3.8 3.7  CL 107  --   --  105 109 110 105  CO2 28  --   --  21* '26 23 26  '$ GLUCOSE 94  --   --  133* 81 99 111*  BUN 12  --   --  '11 13 15 17  '$ CREATININE 0.88 0.74  --  0.58 0.78 0.79 0.81  CALCIUM 9.3  --   --  8.7* 8.7* 8.5* 8.6*  MG  --   --   --   --  1.8  --  1.8  PHOS  --   --   --   --  2.3*  --   --    Recent Labs  Lab 06/09/21 0854 06/13/21 0350  AST 20 23  ALT 11 15  ALKPHOS 76 58  BILITOT 1.1 0.6  PROT 7.3 5.9*  ALBUMIN 3.8 3.0*   Recent Labs  Lab 06/09/21 0854 06/09/21 1456 06/09/21 1501 06/10/21 0559 06/11/21 0846 06/12/21 0339 06/13/21 0350  WBC 5.4 7.4  --  6.2 8.3 7.0 7.9  NEUTROABS 3.2  --   --  5.0  --   --  5.5  HGB 13.8 15.2* 13.6 12.3 12.5 11.5* 12.5  HCT 40.8 45.8 40.0 37.4 37.4 34.7* 37.9  MCV 90.5 90.2  --  90.1 90.3 90.6 91.1  PLT 317 293  --  314 319 289 308   No results for input(s): CKTOTAL, CKMB, CKMBINDEX, TROPONINI in the last 168 hours. No results for input(s): LABPROT, INR in the last 72 hours.  No results for input(s): COLORURINE, LABSPEC, Brent, GLUCOSEU,  HGBUR, BILIRUBINUR, KETONESUR, PROTEINUR, UROBILINOGEN, NITRITE, LEUKOCYTESUR in the last 72 hours.  Invalid input(s): APPERANCEUR     Component Value Date/Time   CHOL 86 06/10/2021 0559   CHOL 168 01/05/2021 1056   TRIG 49 06/10/2021 0600   HDL 48 06/10/2021 0559   HDL 72 01/05/2021 1056   CHOLHDL 1.8 06/10/2021 0559   VLDL 8 06/10/2021 0559   LDLCALC 30 06/10/2021 0559   LDLCALC 80 01/05/2021 1056   Lab Results  Component Value Date   HGBA1C 5.3 06/10/2021   No results found for: LABOPIA, COCAINSCRNUR, LABBENZ, AMPHETMU, THCU, LABBARB  No results for input(s): ETH in the last 168 hours.  I have personally reviewed the radiological images below and agree with the radiology interpretations.  MR BRAIN WO CONTRAST  Result Date: 06/10/2021 CLINICAL DATA:  Neuro deficit, acute, stroke suspected EXAM: MRI HEAD WITHOUT CONTRAST TECHNIQUE: Multiplanar, multiecho pulse sequences of the brain  and surrounding structures were obtained without intravenous contrast. COMPARISON:  None. FINDINGS: Brain: Multifocal acute ischemia within the right internal capsule, left frontal white matter and left insula. Old left occipital infarct and old left frontal infarct. Bilateral confluent hyperintense T2-weighted signal in the white matter. Normal volume of CSF spaces. No chronic microhemorrhage. Normal midline structures. Vascular: Major flow voids are preserved. Skull and upper cervical spine: Normal calvarium and skull base. Visualized upper cervical spine and soft tissues are normal. Sinuses/Orbits:No paranasal sinus fluid levels or advanced mucosal thickening. No mastoid or middle ear effusion. Normal orbits. IMPRESSION: 1. Multifocal acute ischemia within the right internal capsule, left frontal white matter and left insula. No hemorrhage or mass effect. 2. 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 Electronically Signed   By: Ulyses Jarred M.D.   On: 06/10/2021 01:52   IR CT Head Ltd  Result Date:  06/12/2021 INDICATION: New onset of global aphasia. EXAM: 1. EMERGENT LARGE VESSEL OCCLUSION THROMBOLYSIS (anterior CIRCULATION) COMPARISON:  CT angiogram of head and neck of June 09, 2021. MEDICATIONS: Ancef 2 g IV antibiotic was administered within 1 hour of the procedure. ANESTHESIA/SEDATION: General anesthesia. CONTRAST:  Omnipaque 300 approximately 100 mL. FLUOROSCOPY TIME:  Fluoroscopy Time: 64 minutes 50 seconds (1384 mGy). COMPLICATIONS: None immediate. TECHNIQUE: Following a full explanation of the procedure along with the potential associated complications, an informed witnessed consent was obtained. The risks of intracranial hemorrhage of 10%, worsening neurological deficit, ventilator dependency, death and inability to revascularize were all reviewed in detail with the patient's daughter. The patient was then put under general anesthesia by the Department of Anesthesiology at [facility]. The right groin was prepped and draped in the usual sterile fashion. Thereafter using modified Seldinger technique, transfemoral access into the right common femoral artery was obtained without difficulty. Over a 0.035 inch guidewire an 8 [place] Pinnacle 25 cm sheath was inserted. Through this, and also over a 0.035 inch guidewire a 5.5 [place] Simmons 2 support catheter inside of an 088 TracStar guide catheter combination was advanced to the aortic arch region. A Simmons 2 support catheter was then advanced over the guidewire to the mid cervical left ICA by the TracStar guide catheter. The support catheter and the wire were removed. Good aspiration was obtained from the hub of the TracStar guide catheter. FINDINGS: A gentle control arteriogram performed through this demonstrated no evidence of dissections or spasm, or of intraluminal filling defects. More distally, the left internal carotid artery in the petrous, cavernous and supraclinoid segments demonstrate wide patency. The left anterior cerebral artery  opacifies into the capillary and venous phases. The left middle cerebral artery M1 segment demonstrates wide patency. Complete angiographic occlusion was seen of the dominant inferior division in the mid M2 segment. The superior division remained widely patent into the capillary and venous phases. PROCEDURE: Over a 0.014 inch standard Synchro micro guidewire with a J-tip configuration, a 162 cm 021 Trevo trak inside of a 55 132 cm Zoom aspiration catheter combination was advanced to the supraclinoid left ICA. The micro guidewire was then gently advanced through the occluded inferior division into the M2 M3 region followed by the microcatheter. The guidewire was removed. Good aspiration obtained from the hub of the microcatheter. A gentle control arteriogram performed through the microcatheter demonstrated good antegrade flow. This was then connected to continuous heparinized saline infusion. A 4 mm x 40 mm Solitaire X retrieval device was then advanced to the distal end of the microcatheter. The O ring on  the delivery microcatheter was loosened. With slight forward gentle traction with the right hand on the delivery micro guidewire with the left hand the microcatheter was retrieved deploying the retrieval device proximal and just proximal to the occluded inferior division. At this time the 055 Zoom aspiration catheter was advanced and engaged in the proximal inferior division. Thereafter, constant aspiration was applied with a 20 mL syringe at the hub of an 088 Zoom TrakStar catheter, and with Penumbra aspiration device at the hub of the Zoom aspiration catheter for approximately 2-1/2 minutes. Thereafter, the combination of the microcatheter retrieval device, and the Zoom aspiration catheter were retrieved and removed. A control arteriogram performed through 088 guide catheter in the left internal carotid artery demonstrated revascularization of the previously occluded inferior division with patency of the superior  division maintained. A TICI 3 revascularization was obtained. The left anterior cerebral artery circulation demonstrated wide patency. The Zoom 088 catheter was retrieved into the common carotid artery. A control arteriogram performed through this demonstrated patency of the left external carotid artery and its major branches. The left internal carotid artery at the bulb and extracranially appeared widely patent without evidence of intraluminal filling defects or of occlusions or dissections. The 088 Zoom catheter was removed. Hemostasis at the right groin puncture site was sealed with a 6 French Angio-Seal closure device. Distal pulses remained Dopplerable in both feet unchanged. CT of the brain demonstrated no evidence of intracranial hemorrhage or mass effect or midline shift. Patient was left intubated awaiting the COVID test. Patient was transferred to neuro ICU for post revascularization care. IMPRESSION: Status post endovascular complete revascularization of occluded dominant inferior division of the left middle cerebral artery with 1 pass with a 4 mm x 40 mm Solitaire X retrieval device, and contact aspiration achieving a TICI 3 revascularization. PLAN: Follow up as per referring MD. Electronically Signed   By: Luanne Bras M.D.   On: 06/11/2021 11:18   DG CHEST PORT 1 VIEW  Result Date: 06/09/2021 CLINICAL DATA:  ET tube placement EXAM: PORTABLE CHEST 1 VIEW COMPARISON:  03/31/2021 FINDINGS: Endotracheal tube is 2 cm above the carina. NG tube enters the stomach. Cardiomegaly. Lungs clear. No effusions or edema. Aortic atherosclerosis. No acute bony abnormality. IMPRESSION: ET tube 2 cm above the carina. Cardiomegaly.  Aortic atherosclerosis. No active disease. Electronically Signed   By: Rolm Baptise M.D.   On: 06/09/2021 22:00   VAS [place] GROIN PSEUDOANEURYSM  Result Date: 06/10/2021  ARTERIAL PSEUDOANEURYSM  Patient Name:  GLYNNIS QUALLS Veron  Date of Exam:   06/10/2021 Medical Rec #: [id number]      Accession #:    [id number] Date of Birth: 1941/02/20      Patient Gender: F Patient Age:   10 years Exam Location:  [facility] Procedure:      VAS [place] Gloriajean Dell Referring Phys: Durenda Guthrie --------------------------------------------------------------------------------  Exam: Right groin Indications: Patient complains of "mild hardening" noted under RT CFA puncture site per MD. History: S/p IR procedure. Comparison Study: no previous exams Performing Technologist: Jody Hill RVT, RDMS  Examination Guidelines: A complete evaluation includes B-mode imaging, spectral Doppler, color Doppler, and power Doppler as needed of all accessible portions of each vessel. Bilateral testing is considered an integral part of a complete examination. Limited examinations for reoccurring indications may be performed as noted. +------------+----------+--------+------+----------+ Right DuplexPSV (cm/s)WaveformPlaqueComment(s) +------------+----------+--------+------+----------+ CFA            139    biphasic                 +------------+----------+--------+------+----------+  PFA            132    biphasic                 +------------+----------+--------+------+----------+ Prox SFA       139    biphasic                 +------------+----------+--------+------+----------+  Summary: No evidence of pseudoaneurysm, AVF or DVT  Diagnosing physician: Jamelle Haring Electronically signed by Jamelle Haring on 06/10/2021 at 2:04:52 PM.   --------------------------------------------------------------------------------    Final    ECHOCARDIOGRAM COMPLETE  Result Date: 06/10/2021    ECHOCARDIOGRAM REPORT   Patient Name:   [facility] A Spinney Date of Exam: 06/10/2021 Medical Rec #:  [id number]    Height:       65.0 in Accession #:    [id number]   Weight:       203.5 lb Date of Birth:  06/28/1941     BSA:          1.993 m Patient Age:    87 years     BP:           134/59 mmHg Patient Gender: F            HR:           77  bpm. Exam Location:  Inpatient Procedure: 2D Echo, Cardiac Doppler, Color Doppler and 3D Echo Indications:    Stroke I63.9  History:        Patient has prior history of Echocardiogram examinations, most                 recent 03/31/2021. Signs/Symptoms:Altered Mental Status.  Sonographer:    Merrie Roof RDCS Referring Phys: [id number] [place]  1. Left ventricular ejection fraction, by estimation, is 65 to 70%. The left ventricle has normal function. The left ventricle has no regional wall motion abnormalities. There is mild eccentric left ventricular hypertrophy of the posterior-lateral segment. Left ventricular diastolic parameters are indeterminate.  2. Right ventricular systolic function is normal. The right ventricular size is normal. There is normal pulmonary artery systolic pressure. The estimated right ventricular systolic pressure is 99991111 mmHg.  3. Left atrial size was severely dilated.  4. Right atrial size was moderately dilated.  5. The mitral valve is degenerative. Mild to moderate mitral valve regurgitation. No evidence of mitral stenosis. Moderate mitral annular calcification.  6. The aortic valve is grossly normal. There is mild calcification of the aortic valve. Aortic valve regurgitation is not visualized. Mild aortic valve sclerosis is present, with no evidence of aortic valve stenosis.  7. The inferior vena cava is normal in size with greater than 50% respiratory variability, suggesting right atrial pressure of 3 mmHg. Conclusion(s)/Recommendation(s): No intracardiac source of embolism detected on this transthoracic study. A transesophageal echocardiogram can be considered to exclude cardiac source of embolism if clinically indicated. FINDINGS  Left Ventricle: Left ventricular ejection fraction, by estimation, is 65 to 70%. The left ventricle has normal function. The left ventricle has no regional wall motion abnormalities. 3D left ventricular ejection fraction analysis performed  but not reported based on interpreter judgement due to suboptimal quality. The left ventricular internal cavity size was normal in size. There is mild eccentric left ventricular hypertrophy of the posterior-lateral segment. Left ventricular diastolic parameters are indeterminate. Right Ventricle: The right ventricular size is normal. No increase in right ventricular wall thickness. Right ventricular systolic function is normal. There is normal pulmonary artery systolic pressure.  The tricuspid regurgitant velocity is 2.65 m/s, and  with an assumed right atrial pressure of 3 mmHg, the estimated right ventricular systolic pressure is 99991111 mmHg. Left Atrium: Left atrial size was severely dilated. Right Atrium: Right atrial size was moderately dilated. Pericardium: There is no evidence of pericardial effusion. Mitral Valve: The mitral valve is degenerative in appearance. Moderate mitral annular calcification. Mild to moderate mitral valve regurgitation. No evidence of mitral valve stenosis. The mean mitral valve gradient is 2.3 mmHg with average heart rate of 64 bpm. Tricuspid Valve: The tricuspid valve is normal in structure. Tricuspid valve regurgitation is mild . No evidence of tricuspid stenosis. Aortic Valve: The aortic valve is grossly normal. There is mild calcification of the aortic valve. Aortic valve regurgitation is not visualized. Mild aortic valve sclerosis is present, with no evidence of aortic valve stenosis. Aortic valve mean gradient  measures 8.0 mmHg. Aortic valve peak gradient measures 15.2 mmHg. Aortic valve area, by VTI measures 2.28 cm. Pulmonic Valve: The pulmonic valve was normal in structure. Pulmonic valve regurgitation is trivial. No evidence of pulmonic stenosis. Aorta: The aortic root is normal in size and structure. Venous: The inferior vena cava is normal in size with greater than 50% respiratory variability, suggesting right atrial pressure of 3 mmHg. IAS/Shunts: The interatrial septum  was not well visualized.  LEFT VENTRICLE PLAX 2D LVIDd:         6.40 cm LVIDs:         4.00 cm LV PW:         1.20 cm LV IVS:        0.80 cm LVOT diam:     2.00 cm  3D Volume EF: LV SV:         91       3D EF:        71 % LV SV Index:   46       LV EDV:       142 ml LVOT Area:     3.14 cm LV ESV:       41 ml                         LV SV:        100 ml RIGHT VENTRICLE RV Basal diam:  3.90 cm LEFT ATRIUM              Index        RIGHT ATRIUM           Index LA diam:        6.20 cm  3.11 cm/m   RA Area:     27.20 cm LA Vol (A2C):   175.0 ml 87.82 ml/m  RA Volume:   73.60 ml  36.93 ml/m LA Vol (A4C):   252.0 ml 126.46 ml/m LA Biplane Vol: 220.0 ml 110.40 ml/m  AORTIC VALVE AV Area (Vmax):    2.17 cm AV Area (Vmean):   2.25 cm AV Area (VTI):     2.28 cm AV Vmax:           195.00 cm/s AV Vmean:          128.000 cm/s AV VTI:            0.401 m AV Peak Grad:      15.2 mmHg AV Mean Grad:      8.0 mmHg LVOT Vmax:         135.00 cm/s LVOT Vmean:  91.800 cm/s LVOT VTI:          0.291 m LVOT/AV VTI ratio: 0.73  AORTA Ao Root diam: 3.30 cm MITRAL VALVE           TRICUSPID VALVE MV Mean grad: 2.3 mmHg TR Peak grad:   28.1 mmHg                        TR Vmax:        265.00 cm/s                         SHUNTS                        Systemic VTI:  0.29 m                        Systemic Diam: 2.00 cm Cherlynn Kaiser MD Electronically signed by Cherlynn Kaiser MD Signature Date/Time: 06/10/2021/4:27:03 PM    Final    IR PERCUTANEOUS ART THROMBECTOMY/INFUSION INTRACRANIAL INC DIAG ANGIO  Result Date: 06/12/2021 INDICATION: New onset of global aphasia. EXAM: 1. EMERGENT LARGE VESSEL OCCLUSION THROMBOLYSIS (anterior CIRCULATION) COMPARISON:  CT angiogram of head and neck of June 09, 2021. MEDICATIONS: Ancef 2 g IV antibiotic was administered within 1 hour of the procedure. ANESTHESIA/SEDATION: General anesthesia. CONTRAST:  Omnipaque 300 approximately 100 mL. FLUOROSCOPY TIME:  Fluoroscopy Time: 64 minutes 50 seconds  (1384 mGy). COMPLICATIONS: None immediate. TECHNIQUE: Following a full explanation of the procedure along with the potential associated complications, an informed witnessed consent was obtained. The risks of intracranial hemorrhage of 10%, worsening neurological deficit, ventilator dependency, death and inability to revascularize were all reviewed in detail with the patient's daughter. The patient was then put under general anesthesia by the Department of Anesthesiology at [facility]. The right groin was prepped and draped in the usual sterile fashion. Thereafter using modified Seldinger technique, transfemoral access into the right common femoral artery was obtained without difficulty. Over a 0.035 inch guidewire an 8 [place] Pinnacle 25 cm sheath was inserted. Through this, and also over a 0.035 inch guidewire a 5.5 [place] Simmons 2 support catheter inside of an 088 TracStar guide catheter combination was advanced to the aortic arch region. A Simmons 2 support catheter was then advanced over the guidewire to the mid cervical left ICA by the TracStar guide catheter. The support catheter and the wire were removed. Good aspiration was obtained from the hub of the TracStar guide catheter. FINDINGS: A gentle control arteriogram performed through this demonstrated no evidence of dissections or spasm, or of intraluminal filling defects. More distally, the left internal carotid artery in the petrous, cavernous and supraclinoid segments demonstrate wide patency. The left anterior cerebral artery opacifies into the capillary and venous phases. The left middle cerebral artery M1 segment demonstrates wide patency. Complete angiographic occlusion was seen of the dominant inferior division in the mid M2 segment. The superior division remained widely patent into the capillary and venous phases. PROCEDURE: Over a 0.014 inch standard Synchro micro guidewire with a J-tip configuration, a 162 cm 021 Trevo trak inside of a 55  132 cm Zoom aspiration catheter combination was advanced to the supraclinoid left ICA. The micro guidewire was then gently advanced through the occluded inferior division into the M2 M3 region followed by the microcatheter. The guidewire was removed. Good aspiration obtained from the hub of the microcatheter.  A gentle control arteriogram performed through the microcatheter demonstrated good antegrade flow. This was then connected to continuous heparinized saline infusion. A 4 mm x 40 mm Solitaire X retrieval device was then advanced to the distal end of the microcatheter. The O ring on the delivery microcatheter was loosened. With slight forward gentle traction with the right hand on the delivery micro guidewire with the left hand the microcatheter was retrieved deploying the retrieval device proximal and just proximal to the occluded inferior division. At this time the 055 Zoom aspiration catheter was advanced and engaged in the proximal inferior division. Thereafter, constant aspiration was applied with a 20 mL syringe at the hub of an 088 Zoom TrakStar catheter, and with Penumbra aspiration device at the hub of the Zoom aspiration catheter for approximately 2-1/2 minutes. Thereafter, the combination of the microcatheter retrieval device, and the Zoom aspiration catheter were retrieved and removed. A control arteriogram performed through 088 guide catheter in the left internal carotid artery demonstrated revascularization of the previously occluded inferior division with patency of the superior division maintained. A TICI 3 revascularization was obtained. The left anterior cerebral artery circulation demonstrated wide patency. The Zoom 088 catheter was retrieved into the common carotid artery. A control arteriogram performed through this demonstrated patency of the left external carotid artery and its major branches. The left internal carotid artery at the bulb and extracranially appeared widely patent without  evidence of intraluminal filling defects or of occlusions or dissections. The 088 Zoom catheter was removed. Hemostasis at the right groin puncture site was sealed with a 6 French Angio-Seal closure device. Distal pulses remained Dopplerable in both feet unchanged. CT of the brain demonstrated no evidence of intracranial hemorrhage or mass effect or midline shift. Patient was left intubated awaiting the COVID test. Patient was transferred to neuro ICU for post revascularization care. IMPRESSION: Status post endovascular complete revascularization of occluded dominant inferior division of the left middle cerebral artery with 1 pass with a 4 mm x 40 mm Solitaire X retrieval device, and contact aspiration achieving a TICI 3 revascularization. PLAN: Follow up as per referring MD. Electronically Signed   By: Luanne Bras M.D.   On: 06/11/2021 11:18   CT HEAD CODE STROKE WO CONTRAST  Result Date: 06/09/2021 CLINICAL DATA:  Code stroke. Right-sided weakness and slurred speech EXAM: CT HEAD WITHOUT CONTRAST TECHNIQUE: Contiguous axial images were obtained from the base of the skull through the vertex without intravenous contrast. COMPARISON:  03/30/2021 brain MRI FINDINGS: Brain: No evidence of acute infarction, hemorrhage, hydrocephalus, extra-axial collection or mass lesion/mass effect. Remote left occipital and lateral frontal cortex and white matter infarcts, moderate in size. Confluent chronic small vessel ischemia in the hemispheric white matter. No visible acute infarct. Stable sulcal calcification along the right frontal convexity. Vascular: Best seen on reformats is a branching hyperdensity in the left M2 region. CTA is pending. Atherosclerosis Skull: No acute finding Sinuses/Orbits: No acute finding Other: These results were communicated to Dr Quinn Axe at 9:09 am on 06/09/2021 by text page via the  [employer] messaging system. IMPRESSION: 1. Hyperdense left M2 branch with no hemorrhage or visible acute infarct. 2.  Chronic small vessel ischemia with remote left MCA and PCA branch infarcts. Electronically Signed   By: Monte Fantasia M.D.   On: 06/09/2021 09:10   CT ANGIO HEAD NECK W WO CM W PERF (CODE STROKE)  Addendum Date: 06/09/2021   ADDENDUM REPORT: 06/09/2021 09:32 ADDENDUM: Critical Value/emergent results were called by telephone at the time  of interpretation on 06/09/2021 at 9:32 am to provider [facility] , who verbally acknowledged these results. Electronically Signed   By: Monte Fantasia M.D.   On: 06/09/2021 09:32   Result Date: 06/09/2021 CLINICAL DATA:  Stroke suspected. EXAM: CT ANGIOGRAPHY HEAD AND NECK CT PERFUSION BRAIN TECHNIQUE: Multidetector CT imaging of the head and neck was performed using the standard protocol during bolus administration of intravenous contrast. Multiplanar CT image reconstructions and MIPs were obtained to evaluate the vascular anatomy. Carotid stenosis measurements (when applicable) are obtained utilizing NASCET criteria, using the distal internal carotid diameter as the denominator. Multiphase CT imaging of the brain was performed following IV bolus contrast injection. Subsequent parametric perfusion maps were calculated using RAPID software. CONTRAST:  Dose is not currently none on this in progress study. COMPARISON:  Head CT from earlier the same day FINDINGS: CTA NECK FINDINGS Aortic arch: Atheromatous plaque.  Three vessel branching. Right carotid system: Mild plaque at the bifurcation without stenosis or ulceration. Left carotid system: Mild calcified plaque at the bifurcation without stenosis or ulceration Vertebral arteries: No proximal subclavian atherosclerosis. Codominant vertebral arteries that are tortuous but widely patent to the dura. Skeleton: Advanced cervical spine degeneration with particularly severe right-sided C1-2 facet degeneration with bulky spurring impinging on the right C2 nerve root. C2-3 and C3-4 facet ankylosis. Other neck: No acute finding Upper  chest: No acute finding Review of the MIP images confirms the above findings CTA HEAD FINDINGS Anterior circulation: Atheromatous plaque at the bilateral carotid siphon. Distal left M2 branch cut off with some downstream reconstitution. No additional branch occlusion is seen. Negative for aneurysm Posterior circulation: Vertebral and basilar arteries are smoothly contoured and widely patent. No branch occlusion, beading, or aneurysm. Venous sinuses: Diffusely patent Anatomic variants: None significant Review of the MIP images confirms the above findings CT Brain Perfusion Findings: ASPECTS: 10 when accounting for chronic changes CBF (<30%) Volume: 22m Perfusion (Tmax>6.0s) volume: 543mMismatch Volume: 515m page and call has been placed to neurology. IMPRESSION: 1. Emergent left M2 branch occlusion with penumbra pattern on CT perfusion involving a 51 cc area. 2. Mild for age atherosclerosis without flow limiting stenosis or visible embolic source. Electronically Signed: By: JonMonte FantasiaD. On: 06/09/2021 09:28     PHYSICAL EXAM  Temp:  [98.6 F (37 C)-99.3 F (37.4 C)] 99.3 F (37.4 C) (08/10 0400) Pulse Rate:  [56-68] 64 (08/10 0000) Resp:  [15-26] 23 (08/10 0600) BP: (74-175)/(48-100) 141/77 (08/10 0600) SpO2:  [95 %-100 %] 100 % (08/10 0400)  General - Well nourished, well developed, agitated and combative, trying to get out of bed, in chest posey restraint  Ophthalmologic - fundi not visualized due to noncooperation.  Cardiovascular - irregularly irregular heart rate and rhythm.  Neuro - MS: Oriented to self only, follows commands with coaching Speech: States her name and speaks in short sentences. Does not participate with naming and repetition  CN: EOMI, VFF, Face symmetric, Tongue midline Motor: Normal bulk and tone. Antigravity throughout. Does not participate in strength testing.  Sensation: Appears intact to light touch throughout Coordination: UTA  Gait: Deferred    ASSESSMENT/PLAN [name] is a 80 73o. female with history of afib off Xarelto due to recent hematuria, HTN, dementia, stroke in 03/2021, NMS with haldol admitted for aphasia and right sided weakness. No tPA given due to recent hematuria and stroke within 3 months.    Stroke:  right IC, left insular cortex and left frontal infarcts, embolic  pattern, secondary to AFib off Xarelto due to hematuria CT head no acute finding CTA head and neck left M2 occlusion CTP 0/51, positive for penumbra MRI Right IC, left insular cortex and left frontal infarcts 2D Echo EF 65-70% LDL 30 HgbA1c 5.3 lovenox for VTE prophylaxis Xarelto (rivaroxaban) daily (off due to hematuria) prior to admission, have resumed this admission  Patient family counseled to be compliant with her antithrombotic medications Ongoing aggressive stroke risk factor management Therapy recommendations:  pending Disposition:  pending   Hematuria  Per family, only lasted 2 days with dark urine PCP put Xarelto on hold until sees urologist Urologist saw her last week, put on Cipro for 3 days and recommend to resume Xarelto after cipro course finished Pt about to start Xarelto today per original schedule Resumed Xarelto 8/8  Hx of stroke 03/2021 left MCA scattered punctate infarcts, MRA and CUS neg, EF 60-65%, LDL 73, A1C 5.5. stroke etiology was due to noncompliance of Xarelto which was resumed on discharge.  Developed NMS after haldol use during 03/2021 admission. Had ICU monitoring, ativan PRN and no dantrolene needed.   Afib Off home Xarelto due to recent hematuria Urologist plan to start Xarelto today Now on ASA 81 -> Xarelto  Dementia  No official diagnosis yet due to frequent cancellation/rescheduling of neurology appointment Currently pending appointment in 07/2021 Cognitive impairment at home with sundowning, needing 24/7 supervision. Needs help for bathing, but able to eat and dress by herself but slow and sometimes  needs help. Now on posey restrain, agitated and combative Continue Seroquel 25 mg 0700, 1200 and 50 mg HS  Follow up QTC on EKG ordered for 06/14/21 Psychiatry consulted, please view their note on 06/11/21   Hypertension Morning on 8/10 SBP fluctuating in the 140 to 170 range  On hydralazine 25 BID currently, will adjust if necessary based on pressure trends  Currently blood pressure goal is normotensive   Hyperlipidemia Home meds:  lipitor 40  LDL 30, goal < 70 On lipitor 40 Continue statin at discharge  #Urinary Retention #Foley Catheter She had a foley catheter placed this admission that she pulled on 06/12/21. Are trending urine output  - Trend UOP, Bladder Scan Q4 ordered  - Straight cath for bladder scan > 300 ml    Other Stroke Risk Factors Advanced age   Ruta Hinds, NP  Stroke Service Nurse Practitioner Patient seen and discussed with attending physician Dr. Princess Bruins day # 4  ATTENDING NOTE: I reviewed above note and agree with the assessment and plan. Pt was seen and examined.   No family at bedside.  Patient lying in bed, still in restraints.  Per RN, patient slept well overnight, however wake up this morning still has more agitation, combative, still needed chest Posey and bilateral wrist soft restraint.  No neuro changes, refuse medication.  Will increase Seroquel frequency to 25 AM, 25 PM, 50 nightly.  Ativan IV as needed.  PT/OT recommend CIR, rehab coordinator is working for placement.  Repeat EKG in a.m.  For detailed assessment and plan, please refer to above as I have made changes wherever appropriate.   Rosalin Hawking, MD PhD Stroke Neurology 06/13/2021 7:47 AM  This patient is critically ill due to stroke status post thrombectomy, A. fib, dementia with behavioral disturbance and at significant risk of neurological worsening, death form recurrent stroke, hemorrhagic conversion, seizure, in hospital delirium, heart failure. This patient's care requires  constant monitoring of vital signs, hemodynamics, respiratory and cardiac monitoring,  review of multiple databases, neurological assessment, discussion with family, other specialists and medical decision making of high complexity. I spent 35 minutes of neurocritical care time in the care of this patient.     To contact Stroke Continuity provider, please refer to [email or web address] After hours, contact General Neurology

## 2021-06-13 NOTE — Progress Notes (Signed)
PROGRESS NOTE    [name]   [id number]  DOB: 01/03/1941 (80 years old)  PCP: [name], DO    DOA: 06/09/2021 LOS: 4   Assessment & Plan   Active Problems:   Stroke ([place])   Acute ischemic left MCA stroke ([place])   Middle cerebral artery embolism, left   Agitation due to dementia ([place])   Acute left MCA stroke status post mechanical thrombectomy with TICI 3 -cardioembolic etiology due to being off Xarelto in the setting of hematuria Chronic A. Fib -resumed on Xarelto Hypertension Hypokalemia /hypomagnesemia Bradycardia -resolved CT head without acute findings. CTA head and neck showed left M2 occlusion MRI brain with right ICA, left insular cortex and left frontal infarcts Echo shows EF 65 to 70% LDL 38 A1c 5.3% --Stroke team following his primary --Continue Xarelto and lovastatin --Frequent reorientation and delirium precautions --Monitor and replace electrolytes  Hemoglobin decline -possibly dilutional.  No signs of active bleeding. --Monitor CBC --Monitor for signs of bleeding with Xarelto --transfuse if hemoglobin less than 7  Acute hyperactive delirium versus dementia with sundowning --Started on nightly Seroquel, continue  Generalized weakness and immobility -  --Plan is for discharge to CIR. --Pulmonary hygiene, OOB to chair, PT and OT  Hematuria -prior to admission and resolved.  Xarelto has been resumed 8/8, had been on hold per PCP until patient saw urology. Seen by urology last week and given 3-day Cipro and told to resume Xarelto after Cipro course was finished.  History of stroke -in May of this year, left MCA scattered punctate infarcts.  MRA and carotid ultrasound negative.  EF was 60 to 65%, LDL 73, A1c was 5.5%.  Stroke etiology was deemed secondary to noncompliance with Xarelto.    History of NMS  - patient developed NMS after Haldol use during admission in May, dantrolene was not required was monitored closely in ICU and treated with as needed  Ativan. --Avoid Haldol --Monitor on Seroquel  A. Fib -rate controlled. --Resumed on Xarelto  Acute delirium with underlying dementia with behavioral disturbance-no prior formal diagnosis due to multiple cancellations and rescheduling of neurology appointments for evaluation of this. --Appointment pending in September --Baseline cognitive impairment in the home setting with sundowning, requires 24/7 supervision and assistance for bathing.  Able to eat and dress independently but slow and sometimes needs assistance. --Requiring Posey restraint due to agitation and combativeness and for safety --Continue Seroquel --Monitor QTC --Psychiatry consulted, see their recommendations  Hypertension -BP fluctuates.  Currently normotensive on hydralazine 25 mg twice a day.  Hyperlipidemia -continue Lipitor 40 mg daily  Urinary retention Foley catheter -patient pulled out on 06/12/2021 --Monitor urine output closely --Bladder scans --And out cath for retention over 300 cc   Patient BMI: Body mass index is 33.86 kg/m.   DVT prophylaxis: SCD's Start: 06/09/21 1143 rivaroxaban (XARELTO) tablet 20 mg   Diet:  Diet Orders (From admission, onward)     Start     Ordered   06/10/21 1135  Diet Heart Room service appropriate? Yes; Fluid consistency: Thin  Diet effective now       Question Answer Comment  Room service appropriate? Yes   Fluid consistency: Thin      06/10/21 1135              Code Status: Full Code   Brief Narrative / Hospital Course to Date:      Subjective 06/13/21    Patient (80 years old) seen at bedside today.  She has Posey restraint in place and no  mitten on her left hand.  With the right hand she is fidgeting with the buttons on the side of the bed.  She is pleasantly confused, not seemingly agitated or combative at this time.  She is vocal but confused and not answering clinical questions appropriately.  She does deny pain however.   Disposition Plan & Communication    Status is: Inpatient  Remains inpatient appropriate because:Inpatient level of care appropriate due to severity of illness, with stroke status post thrombectomy.  Requires CIR rehab on discharge, evaluation pending  Dispo: The patient is from: Home              Anticipated d/c is to: CIR              Patient currently is not medically stable to d/c.   Difficult to place patient No     Consults, Procedures, Significant Events   Consultants:  Neurology PCCM  Procedures:  Mechanical thrombectomy 8/6 Intubation 8/6 Extubation 8/7  Antimicrobials:  Anti-infectives (From admission, onward)    None         Micro    Objective   Vitals:   06/13/21 0800 06/13/21 0900 06/13/21 1000 06/13/21 1200  BP: 125/71   (!) 145/65  Pulse: 90 85 91 83  Resp: (!) 21 20    Temp:      TempSrc:      SpO2: 95% 94% 98% 99%  Weight:      Height:        Intake/Output Summary (Last 24 hours) at 06/13/2021 1529 Last data filed at 06/13/2021 1200 Gross per 24 hour  Intake 300 ml  Output 850 ml  Net -550 ml   Filed Weights   06/12/21 0743  Weight: 92.3 kg    Physical Exam:  General exam: awake, alert, no acute distress, confused and fidgety HEENT: atraumatic, clear conjunctiva, anicteric sclera, moist mucus membranes Respiratory system: CTAB, no wheezes, rales or rhonchi, normal respiratory effort. Cardiovascular system: normal S1/S2, RRR, no pedal edema.   Gastrointestinal system: soft, NT, ND, no HSM felt, +bowel sounds. Central nervous system: no gross focal neurologic deficits on exam limited by patient's dementia and inability to follow commands, normal speech Extremities: Mitten restraints on left hand, no edema, normal tone Psychiatry: normal mood, congruent affect, judgement and insight appear normal  Labs   Data Reviewed: I have personally reviewed following labs and imaging studies  CBC: Recent Labs  Lab 06/09/21 0854 06/09/21 1456 06/09/21 1501 06/10/21 0559  06/11/21 0846 06/12/21 0339 06/13/21 0350  WBC 5.4 7.4  --  6.2 8.3 7.0 7.9  NEUTROABS 3.2  --   --  5.0  --   --  5.5  HGB 13.8 15.2* 13.6 12.3 12.5 11.5* 12.5  HCT 40.8 45.8 40.0 37.4 37.4 34.7* 37.9  MCV 90.5 90.2  --  90.1 90.3 90.6 91.1  PLT 317 293  --  314 319 289 [id number]   Basic Metabolic Panel: Recent Labs  Lab 06/09/21 0854 06/09/21 1456 06/09/21 1501 06/09/21 2354 06/11/21 0846 06/12/21 0339 06/13/21 0350  NA 142  --  141 136 142 139 139  K 4.0  --  3.6 3.6 3.3* 3.8 3.7  CL 107  --   --  105 109 110 105  CO2 28  --   --  21* '26 23 26  '$ GLUCOSE 94  --   --  133* 81 99 111*  BUN 12  --   --  '11 13 15 '$ 17  CREATININE 0.88 0.74  --  0.58 0.78 0.79 0.81  CALCIUM 9.3  --   --  8.7* 8.7* 8.5* 8.6*  MG  --   --   --   --  1.8  --  1.8  PHOS  --   --   --   --  2.3*  --   --    GFR: Estimated Creatinine Clearance: 62.2 mL/min (by C-G formula based on SCr of 0.81 mg/dL). Liver Function Tests: Recent Labs  Lab 06/09/21 0854 06/13/21 0350  AST 20 23  ALT 11 15  ALKPHOS 76 58  BILITOT 1.1 0.6  PROT 7.3 5.9*  ALBUMIN 3.8 3.0*   No results for input(s): LIPASE, AMYLASE in the last 168 hours. No results for input(s): AMMONIA in the last 168 hours. Coagulation Profile: Recent Labs  Lab 06/09/21 0854  INR 1.0   Cardiac Enzymes: No results for input(s): CKTOTAL, CKMB, CKMBINDEX, TROPONINI in the last 168 hours. BNP (last 3 results) No results for input(s): PROBNP in the last 8760 hours. HbA1C: No results for input(s): HGBA1C in the last 72 hours. CBG: Recent Labs  Lab 06/09/21 0852 06/09/21 1047  GLUCAP 82 75   Lipid Profile: No results for input(s): CHOL, HDL, LDLCALC, TRIG, CHOLHDL, LDLDIRECT in the last 72 hours. Thyroid Function Tests: No results for input(s): TSH, T4TOTAL, FREET4, T3FREE, THYROIDAB in the last 72 hours. Anemia Panel: No results for input(s): VITAMINB12, FOLATE, FERRITIN, TIBC, IRON, RETICCTPCT in the last 72 hours. Sepsis Labs: No  results for input(s): PROCALCITON, LATICACIDVEN in the last 168 hours.  Recent Results (from the past 240 hour(s))  Microscopic Examination     Status: Abnormal   Collection Time: 06/06/21 11:30 AM   Urine  Result Value Ref Range Status   WBC, UA 11-30 (A) 0 - 5 /hpf Final   RBC 0-2 0 - 2 /hpf Final   Epithelial Cells (non renal) 0-10 0 - 10 /hpf Final   Renal Epithel, UA 0-10 (A) None seen /hpf Final   Bacteria, UA Many (A) None seen/Few Final  CULTURE, URINE COMPREHENSIVE     Status: Abnormal   Collection Time: 06/06/21 12:55 PM   Specimen: Urine   UR  Result Value Ref Range Status   Urine Culture, Comprehensive Final report (A)  Final   Organism ID, Bacteria Escherichia coli (A)  Final    Comment: Cefazolin <=4 ug/mL Cefazolin with an MIC <=16 predicts susceptibility to the oral agents cefaclor, cefdinir, cefpodoxime, cefprozil, cefuroxime, cephalexin, and loracarbef when used for therapy of uncomplicated urinary tract infections due to E. coli, Klebsiella pneumoniae, and Proteus mirabilis. Greater than 100,000 colony forming units per mL    ANTIMICROBIAL SUSCEPTIBILITY Comment  Final    Comment:       ** S = Susceptible; I = Intermediate; R = Resistant **                    P = Positive; N = Negative             MICS are expressed in micrograms per mL    Antibiotic                 RSLT#1    RSLT#2    RSLT#3    RSLT#4 Amoxicillin/Clavulanic Acid    S Ampicillin                     S Cefepime  S Ceftriaxone                    S Cefuroxime                     S Ciprofloxacin                  S Ertapenem                      S Gentamicin                     R Imipenem                       S Levofloxacin                   S Meropenem                      S Nitrofurantoin                 S Piperacillin/Tazobactam        S Tetracycline                   R Tobramycin                     S Trimethoprim/Sulfa             S   Resp Panel by RT-PCR (Flu  A&B, Covid) Nasopharyngeal Swab     Status: None   Collection Time: 06/09/21 11:35 AM   Specimen: Nasopharyngeal Swab; Nasopharyngeal(NP) swabs in vial transport medium  Result Value Ref Range Status   SARS Coronavirus 2 by RT PCR NEGATIVE NEGATIVE Final    Comment: (NOTE) SARS-CoV-2 target nucleic acids are NOT DETECTED.  The SARS-CoV-2 RNA is generally detectable in upper respiratory specimens during the acute phase of infection. The lowest concentration of SARS-CoV-2 viral copies this assay can detect is 138 copies/mL. A negative result does not preclude SARS-Cov-2 infection and should not be used as the sole basis for treatment or other patient management decisions. A negative result may occur with  improper specimen collection/handling, submission of specimen other than nasopharyngeal swab, presence of viral mutation(s) within the areas targeted by this assay, and inadequate number of viral copies(<138 copies/mL). A negative result must be combined with clinical observations, patient history, and epidemiological information. The expected result is Negative.  Fact Sheet for Patients:  EntrepreneurPulse.com.au  Fact Sheet for Healthcare Providers:  IncredibleEmployment.be  This test is no t yet approved or cleared by the [place] FDA and  has been authorized for detection and/or diagnosis of SARS-CoV-2 by FDA under an Emergency Use Authorization (EUA). This EUA will remain  in effect (meaning this test can be used) for the duration of the COVID-19 declaration under Section 564(b)(1) of the Act, 21 U.S.C.section 360bbb-3(b)(1), unless the authorization is terminated  or revoked sooner.       Influenza A by PCR NEGATIVE NEGATIVE Final   Influenza B by PCR NEGATIVE NEGATIVE Final    Comment: (NOTE) The Xpert Xpress SARS-CoV-2/FLU/RSV plus assay is intended as an aid in the diagnosis of influenza from Nasopharyngeal swab specimens  and should not be used as a sole basis for treatment. Nasal washings and aspirates are unacceptable for Xpert Xpress SARS-CoV-2/FLU/RSV testing.  Fact Sheet for Patients: EntrepreneurPulse.com.au  Fact Sheet for Healthcare  Providers: IncredibleEmployment.be  This test is not yet approved or cleared by the [place] and has been authorized for detection and/or diagnosis of SARS-CoV-2 by FDA under an Emergency Use Authorization (EUA). This EUA will remain in effect (meaning this test can be used) for the duration of the COVID-19 declaration under Section 564(b)(1) of the Act, 21 U.S.C. section 360bbb-3(b)(1), unless the authorization is terminated or revoked.  Performed at [facility], [place] [address], [place], [place] [address]   MRSA Next Gen by PCR, Nasal     Status: None   Collection Time: 06/09/21  1:54 PM   Specimen: Nasal Mucosa; Nasal Swab  Result Value Ref Range Status   MRSA by PCR Next Gen NOT DETECTED NOT DETECTED Final    Comment: (NOTE) The GeneXpert MRSA Assay (FDA approved for NASAL specimens only), is one component of a comprehensive MRSA colonization surveillance program. It is not intended to diagnose MRSA infection nor to guide or monitor treatment for MRSA infections. Test performance is not FDA approved in patients less than 49 years old. Performed at [facility], [place] [address] [place],  [address]       Imaging Studies   No results found.   Medications   Scheduled Meds:  atorvastatin  40 mg Oral QHS   Chlorhexidine Gluconate Cloth  6 each Topical Daily   hydrALAZINE  25 mg Oral BID   [START ON 06/14/2021] QUEtiapine  25 mg Oral q AM   QUEtiapine  25 mg Oral Q1200   QUEtiapine  50 mg Oral QHS   rivaroxaban  20 mg Oral Q supper   Continuous Infusions:  sodium chloride         LOS: 4 days    Time spent: 30 minutes    Ezekiel Slocumb, DO [facility]  06/13/2021, 3:29 PM      If 7PM-7AM, please contact night-coverage. How to contact the [facility] Attending or Consulting provider [place] or covering provider during after hours [place], for this patient?    Check the care team in [facility] and look for a) attending/consulting [facility] provider listed and b) the [facility] team listed Log into [email or web address] and use [place]'s universal password to access. If you do not have the password, please contact the hospital operator. Locate the [facility] provider you are looking for under [facility] and page to a number that you can be directly reached. If you still have difficulty reaching the provider, please page the [facility] (Director on Call) for the Hospitalists listed on amion for assistance.

## 2021-06-13 NOTE — Progress Notes (Signed)
Inpatient Rehab Admissions Coordinator:   Awaiting determination from [facility] regarding CIR prior auth.    Shann Medal, PT, DPT Admissions Coordinator [phone] 06/13/21  10:34 AM

## 2021-06-14 DIAGNOSIS — I63412 Cerebral infarction due to embolism of left middle cerebral artery: Secondary | ICD-10-CM | POA: Diagnosis not present

## 2021-06-14 DIAGNOSIS — G210 Malignant neuroleptic syndrome: Secondary | ICD-10-CM | POA: Diagnosis not present

## 2021-06-14 DIAGNOSIS — F0391 Unspecified dementia with behavioral disturbance: Secondary | ICD-10-CM | POA: Diagnosis not present

## 2021-06-14 DIAGNOSIS — I6602 Occlusion and stenosis of left middle cerebral artery: Secondary | ICD-10-CM | POA: Diagnosis not present

## 2021-06-14 DIAGNOSIS — I63512 Cerebral infarction due to unspecified occlusion or stenosis of left middle cerebral artery: Secondary | ICD-10-CM | POA: Diagnosis not present

## 2021-06-14 DIAGNOSIS — Z006 Encounter for examination for normal comparison and control in clinical research program: Secondary | ICD-10-CM | POA: Diagnosis not present

## 2021-06-14 DIAGNOSIS — Z20822 Contact with and (suspected) exposure to covid-19: Secondary | ICD-10-CM | POA: Diagnosis not present

## 2021-06-14 LAB — BASIC METABOLIC PANEL
Anion gap: 9 (ref 5–15)
BUN: 12 mg/dL (ref 8–23)
CO2: 26 mmol/L (ref 22–32)
Calcium: 8.8 mg/dL — ABNORMAL LOW (ref 8.9–10.3)
Chloride: 105 mmol/L (ref 98–111)
Creatinine, Ser: 0.69 mg/dL (ref 0.44–1.00)
GFR, Estimated: >60 mL/min (ref >60)
Glucose, Bld: 102 mg/dL — ABNORMAL HIGH (ref 70–99)
Potassium: 3.5 mmol/L (ref 3.5–5.1)
Sodium: 140 mmol/L (ref 135–145)

## 2021-06-14 LAB — CBC
HCT: 34.8 % — ABNORMAL LOW (ref 36.0–46.0)
Hemoglobin: 11.2 g/dL — ABNORMAL LOW (ref 12.0–15.0)
MCH: 29.7 pg (ref 26.0–34.0)
MCHC: 32.2 g/dL (ref 30.0–36.0)
MCV: 92.3 fL (ref 80.0–100.0)
Platelets: 278 10*3/uL (ref 150–400)
RBC: 3.77 MIL/uL — ABNORMAL LOW (ref 3.87–5.11)
RDW: 14.8 % (ref 11.5–15.5)
WBC: 7.4 10*3/uL (ref 4.0–10.5)
nRBC: 0 % (ref 0.0–0.2)

## 2021-06-14 LAB — CALCIUM, IONIZED: Calcium, Ionized, Serum: 4.8 mg/dL (ref 4.5–5.6)

## 2021-06-14 MED ORDER — QUETIAPINE FUMARATE 25 MG PO TABS
25.0000 mg | ORAL_TABLET | Freq: Every day | ORAL | Status: DC
  Administered 2021-06-15 – 2021-06-19 (×4): 25 mg via ORAL
  Filled 2021-06-14 (×8): qty 1

## 2021-06-14 MED ORDER — POTASSIUM CHLORIDE CRYS ER 20 MEQ PO TBCR: 40.0000 meq | EXTENDED_RELEASE_TABLET | Freq: Once | ORAL | Status: DC

## 2021-06-14 MED ORDER — QUETIAPINE FUMARATE 50 MG PO TABS
50.0000 mg | ORAL_TABLET | Freq: Every morning | ORAL | Status: DC
  Administered 2021-06-15 – 2021-06-19 (×4): 50 mg via ORAL
  Filled 2021-06-14 (×4): qty 1
  Filled 2021-06-14: qty 2

## 2021-06-14 NOTE — Progress Notes (Signed)
Inpatient Rehab Admissions Coordinator:   Awaiting determination from [facility] regarding request for CIR prior authorization.  Updated daughter in law, [name], over the phone.   Shann Medal, PT, DPT Admissions Coordinator [phone] 06/14/21  11:52 AM

## 2021-06-14 NOTE — TOC Initial Note (Signed)
Transition of Care [facility]) - Initial/Assessment Note    Patient Details  Name: [name] MRN: [id number] Date of Birth: Dec 19, 1940  Transition of Care [facility]) CM/SW Contact:    Ella Bodo, RN Phone Number: 06/14/2021, 5:05 PM  Clinical Narrative:                 This 80 y.o. female admitted with acute onset Rt sided weakness and aphasia.  CTA showed acute L M2 oacclusion > thrombectomy.  MRI showed:  Multifocal acute ischemia within the right internal capsule, left frontal white matter and left insula. 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 PTA, pt requires assistance with ADLs; ambulates with RW at baseline.  She is cared for by her daughter in law, and a family friend.  PT/OT recommending CIR, and insurance authorization has been initiated by rehab admissions liaison.  Will follow progress.   Expected Discharge Plan: IP Rehab Facility Barriers to Discharge: Continued Medical Work up        Expected Discharge Plan and Services Expected Discharge Plan: [place]   Discharge Planning Services: CM Consult   Living arrangements for the past 2 months: Single Family Home                                      Prior Living Arrangements/Services Living arrangements for the past 2 months: Single Family Home Lives with:: Adult Children Patient language and need for interpreter reviewed:: Yes Do you feel safe going back to the place where you live?: Yes      Need for Family Participation in Patient Care: Yes (Comment) Care giver support system in place?: Yes (comment)   Criminal Activity/Legal Involvement Pertinent to Current Situation/Hospitalization: No - Comment as needed  Activities of Daily Living Home Assistive Devices/Equipment: None ADL Screening (condition at time of admission) Patient's cognitive ability adequate to safely complete daily activities?: No Is the patient deaf or have difficulty hearing?: No Does the patient have difficulty seeing,  even when wearing glasses/contacts?: No Does the patient have difficulty concentrating, remembering, or making decisions?: Yes Patient able to express need for assistance with ADLs?: Yes Does the patient have difficulty dressing or bathing?: Yes Independently performs ADLs?: No Does the patient have difficulty walking or climbing stairs?: Yes Weakness of Legs: None Weakness of Arms/Hands: None                 Emotional Assessment Appearance:: Appears stated age Attitude/Demeanor/Rapport: Uncooperative Affect (typically observed): Agitated        Admission diagnosis:  Stroke [facility]) [I63.9] Acute ischemic left MCA stroke ([place]) [I63.512] Middle cerebral artery embolism, left [I66.02] Patient Active Problem List   Diagnosis Date Noted   Agitation due to dementia ([place]) 06/11/2021   Stroke ([place]) 06/09/2021   Acute ischemic left MCA stroke ([place]) 06/09/2021   Middle cerebral artery embolism, left 06/09/2021   Acute ischemic stroke ([place])    Memory loss 05/28/2021   Dementia without behavioral disturbance ([place]) 05/28/2021   Neuroleptic malignant syndrome 04/01/2021   History of CVA with residual deficit 03/30/2021   Acute confusion 03/29/2021   Delirium 03/29/2021   Cellulitis 03/29/2021   Chronic venous insufficiency 10/14/2016   Atrial fibrillation ([place]) 06/15/2015   Rash 06/15/2015   HTN (hypertension) 04/11/2015   Osteoarthritis 04/11/2015   PCP:  [name], DO Pharmacy:   [place], [place] [address] [place] [address] Phone: [phone] Fax: [phone]  [facility] [place], [place] - [place] [address] Phone: [phone] Fax: [phone]     Social Determinants of Health (SDOH) Interventions    Readmission Risk Interventions No flowsheet data found.  Reinaldo Raddle, RN, BSN  Trauma/Neuro ICU Case Manager [phone]

## 2021-06-14 NOTE — Progress Notes (Addendum)
STROKE TEAM PROGRESS NOTE   SUBJECTIVE (INTERVAL HISTORY) Patient lying in bed, still in restraints.  Per RN, patient slept well overnight, however wake up this morning agitated and at times combative.  Requiring chest Posey and bilateral wrist soft restraint.  No neuro changes.    EKG: QTC 457. Will increase Seroquel to 50 mg at  7AM, 25 at lunch, and 50 nightly.  Ativan IV as needed.    PT/OT recommend CIR, awaiting CIR prior authorization.    OBJECTIVE Temp:  [98.4 F (36.9 C)-99.9 F (37.7 C)] 98.4 F (36.9 C) (08/11 1200) Pulse Rate:  [62-100] 88 (08/11 1200) Cardiac Rhythm: Atrial fibrillation (08/11 1200) Resp:  [16-36] 20 (08/11 1200) BP: (127-171)/(66-114) 156/76 (08/11 1200) SpO2:  [91 %-100 %] 98 % (08/11 1200)  Recent Labs  Lab 06/09/21 0852 06/09/21 1047  GLUCAP 82 75   Recent Labs  Lab 06/09/21 2354 06/11/21 0846 06/12/21 0339 06/13/21 0350 06/14/21 0358  NA 136 142 139 139 140  K 3.6 3.3* 3.8 3.7 3.5  CL 105 109 110 105 105  CO2 21* '26 23 26 26  '$ GLUCOSE 133* 81 99 111* 102*  BUN '11 13 15 17 12  '$ CREATININE 0.58 0.78 0.79 0.81 0.69  CALCIUM 8.7* 8.7* 8.5* 8.6* 8.8*  MG  --  1.8  --  1.8  --   PHOS  --  2.3*  --   --   --    Recent Labs  Lab 06/09/21 0854 06/13/21 0350  AST 20 23  ALT 11 15  ALKPHOS 76 58  BILITOT 1.1 0.6  PROT 7.3 5.9*  ALBUMIN 3.8 3.0*   Recent Labs  Lab 06/09/21 0854 06/09/21 1456 06/10/21 0559 06/11/21 0846 06/12/21 0339 06/13/21 0350 06/14/21 0358  WBC 5.4   < > 6.2 8.3 7.0 7.9 7.4  NEUTROABS 3.2  --  5.0  --   --  5.5  --   HGB 13.8   < > 12.3 12.5 11.5* 12.5 11.2*  HCT 40.8   < > 37.4 37.4 34.7* 37.9 34.8*  MCV 90.5   < > 90.1 90.3 90.6 91.1 92.3  PLT 317   < > 314 319 289 308 278   < > = values in this interval not displayed.   No results for input(s): CKTOTAL, CKMB, CKMBINDEX, TROPONINI in the last 168 hours. No results for input(s): LABPROT, INR in the last 72 hours.  No results for input(s): COLORURINE,  LABSPEC, [place], GLUCOSEU, HGBUR, BILIRUBINUR, KETONESUR, PROTEINUR, UROBILINOGEN, NITRITE, LEUKOCYTESUR in the last 72 hours.  Invalid input(s): APPERANCEUR     Component Value Date/Time   CHOL 86 06/10/2021 0559   CHOL 168 01/05/2021 1056   TRIG 49 06/10/2021 0600   HDL 48 06/10/2021 0559   HDL 72 01/05/2021 1056   CHOLHDL 1.8 06/10/2021 0559   VLDL 8 06/10/2021 0559   LDLCALC 30 06/10/2021 0559   LDLCALC 80 01/05/2021 1056   Lab Results  Component Value Date   HGBA1C 5.3 06/10/2021   No results found for: LABOPIA, COCAINSCRNUR, LABBENZ, AMPHETMU, THCU, LABBARB  No results for input(s): ETH in the last 168 hours.  I have personally reviewed the radiological images below and agree with the radiology interpretations.  MR BRAIN WO CONTRAST  Result Date: 06/10/2021 IMPRESSION:  1. Multifocal acute ischemia within the right internal capsule, left frontal white matter and left insula. No hemorrhage or mass effect. 2. 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   [place]  Result Date: 06/12/2021 IMPRESSION: Status post endovascular complete revascularization of occluded dominant inferior division of the left middle cerebral artery with 1 pass with a 4 mm x 40 mm Solitaire X retrieval device, and contact aspiration achieving a TICI 3 revascularization.   DG CHEST PORT 1 VIEW  Result Date: 06/09/2021 IMPRESSION: ET tube 2 cm above the carina. Cardiomegaly.  Aortic atherosclerosis. No active disease. Electronically Signed   By: Rolm Baptise M.D.   On: 06/09/2021 22:00   VAS [place] GROIN PSEUDOANEURYSM  Result Date: 06/10/2021 Summary: No evidence of pseudoaneurysm, AVF or DVT    ECHOCARDIOGRAM COMPLETE  Result Date: 06/10/2021 IMPRESSIONS   1. Left ventricular ejection fraction, by estimation, is 65 to 70%. The left ventricle has normal function. The left ventricle has no regional wall motion abnormalities. There is mild eccentric left ventricular hypertrophy of the  posterior-lateral segment. Left ventricular diastolic parameters are indeterminate.   2. Right ventricular systolic function is normal. The right ventricular size is normal. There is normal pulmonary artery systolic pressure. The estimated right ventricular systolic pressure is 99991111 mmHg.   3. Left atrial size was severely dilated.   4. Right atrial size was moderately dilated.   5. The mitral valve is degenerative. Mild to moderate mitral valve regurgitation. No evidence of mitral stenosis. Moderate mitral annular calcification.   6. The aortic valve is grossly normal. There is mild calcification of the aortic valve. Aortic valve regurgitation is not visualized. Mild aortic valve sclerosis is present, with no evidence of aortic valve stenosis.   7. The inferior vena cava is normal in size with greater than 50% respiratory variability, suggesting right atrial pressure of 3 mmHg.   IR PERCUTANEOUS ART THROMBECTOMY/INFUSION INTRACRANIAL INC DIAG ANGIO  Result Date: 06/12/2021 INDICATION: New onset of global aphasia. EXAM: 1.  IMPRESSION: Status post endovascular complete revascularization of occluded dominant inferior division of the left middle cerebral artery with 1 pass with a 4 mm x 40 mm Solitaire X retrieval device, and contact aspiration achieving a TICI 3 revascularization. PLAN: Follow up as per referring MD. Electronically Signed   By: Luanne Bras M.D.   On: 06/11/2021 11:18   CT HEAD CODE STROKE WO CONTRAST  Result Date: 06/09/2021 IMPRESSION:  1. Hyperdense left M2 branch with no hemorrhage or visible acute infarct.  2. Chronic small vessel ischemia with remote left MCA and PCA branch infarcts.   CT ANGIO HEAD NECK W WO CM W PERF (CODE STROKE) Result Date: 06/09/2021 IMPRESSION: 1. Emergent left M2 branch occlusion with penumbra pattern on CT perfusion involving a 51 cc area. 2. Mild for age atherosclerosis without flow limiting stenosis or visible embolic source. Electronically  Signed: By: Monte Fantasia M.D. On: 06/09/2021 09:28     PHYSICAL EXAM  Temp:  [98.4 F (36.9 C)-99.9 F (37.7 C)] 98.4 F (36.9 C) (08/11 1200) Pulse Rate:  [62-100] 88 (08/11 1200) Resp:  [16-36] 20 (08/11 1200) BP: (127-171)/(66-114) 156/76 (08/11 1200) SpO2:  [91 %-100 %] 98 % (08/11 1200)  General - Well nourished, well developed, agitated, in chest posey restraint and bilateral soft wrist restraints  Ophthalmologic - fundi not visualized due to noncooperation.  Cardiovascular - irregularly irregular heart rate and rhythm.  Neuro - MS: Oriented to self only, follows commands with coaching Speech: States her name and speaks in short sentences. Does not participate with naming and repetition  CN: EOMI, VFF, Face symmetric, Tongue midline Motor: Normal bulk and tone. Antigravity throughout. Does not participate in  strength testing.  Sensation: Appears intact to light touch throughout Coordination: UTA  Gait: Deferred   ASSESSMENT/PLAN [name] is a 80 y.o. female with history of afib off Xarelto due to recent hematuria, HTN, dementia, stroke in 03/2021, NMS with haldol admitted for aphasia and right sided weakness. No tPA given due to recent hematuria and stroke within 3 months.    Stroke:  right IC, left insular cortex and left frontal infarcts, embolic pattern, secondary to AFib off Xarelto due to hematuria CT head no acute finding CTA head and neck left M2 occlusion CTP 0/51, positive for penumbra MRI Right IC, left insular cortex and left frontal infarcts 2D Echo EF 65-70% LDL 30 HgbA1c 5.3 lovenox for VTE prophylaxis Xarelto (rivaroxaban) daily Patient family counseled to be compliant with her antithrombotic medications Ongoing aggressive stroke risk factor management Therapy recommendations:  CIR Disposition:  pending   Hematuria  Per family, only lasted 2 days with dark urine PCP put Xarelto on hold until sees urologist Urologist saw her last week,  Cipro for 3 days and recommend to resume Xarelto after cipro course finished Resumed Xarelto 8/8  Hx of stroke 03/2021 left MCA scattered punctate infarcts, MRA and CUS neg, EF 60-65%, LDL 73, A1C 5.5. stroke etiology was due to noncompliance of Xarelto which was resumed on discharge.  Developed NMS after haldol use during 03/2021 admission. Had ICU monitoring, ativan PRN and no dantrolene needed.   Afib Rate controlled Xarelto resumed on 8/8  Dementia  No official diagnosis yet due to frequent cancellation/rescheduling of neurology appointment Currently pending appointment in 07/2021 Cognitive impairment at home with sundowning, needing 24/7 supervision. Needs help for bathing, but able to eat and dress by herself but slow and sometimes needs help. Now on posey restrain, agitated and combative at times Continue Seroquel 50 mg 0700, 25 mg at 1200 and 50 mg HS  Follow up QTC on EKG: 457 Psychiatry consulted, please view their note on 06/11/21   Hypertension On hydralazine 25 BID currently, will adjust if necessary based on pressure trends  Currently blood pressure goal is normotensive   Hyperlipidemia Home meds:  lipitor 40  LDL 30, goal < 70 On lipitor 40 Continue statin at discharge  #Urinary Retention - Trend UOP, Bladder Scan Q4  - Straight cath for bladder scan > 300 ml    Other Stroke Risk Factors Advanced age   Louretta Parma, NP  Stroke Service Nurse Practitioner Patient seen and discussed with attending physician Dr. Princess Bruins day # 5  ATTENDING NOTE: I reviewed above note and agree with the assessment and plan. Pt was seen and examined.   No family at bedside.  Patient lying in bed, still has chest Posey and bilateral wrist soft restraint.  Per RN, patient slept well last night however this morning again became mildly agitated and combative, trying to get out of the bed.  Otherwise no neuro changes.  EKG QTC 457.  Discussed with psychiatrist, will  increase morning dose Seroquel to 50 mg, continue noontime 25 and nighttime 50 mg.  Continue Xarelto and Lipitor.  PT/OT recommend CIR.  For detailed assessment and plan, please refer to above as I have made changes wherever appropriate.   Neurology will sign off. Please call with questions. Pt will follow up with stroke clinic NP at [facility] in about 4 weeks. Thanks for the consult.   Rosalin Hawking, MD PhD Stroke Neurology 06/14/2021 7:24 PM

## 2021-06-14 NOTE — Progress Notes (Signed)
Occupational Therapy Treatment Patient Details Name: [name] MRN: [id number] DOB: 1941/09/17 Today's Date: 06/14/2021    History of present illness This 80 y.o. female admitted with acute onset Rt sided weakness and aphasia.  CTA showed acute L M2 oacclusion > thrombectomy.  MRI showed:  Multifocal acute ischemia within the right internal capsule, left frontal white matter and left insula. 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  PMH includes:  cat bite with cellulitis Lt Lower leg, HTN, h/o CVAs, mild dementia   OT comments  Pt demonstrates increased confusion this date with poor attention and thus required up to max A to attend to familiar ADLs tasks.  She required max A for ADLs, and min A +2 for functional mobility.  Pt consistently manipulating things in hands and grabbing for items.  A busy mat was provided.  Continue to recommend CIR level rehab as a consistent, daily schedule would likely optimize her ability to participate.    Follow Up Recommendations  CIR    Equipment Recommendations  None recommended by OT    Recommendations for Other Services Rehab consult    Precautions / Restrictions Precautions Precautions: Fall Precaution Comments: posey, wrist restraints, watch BP       Mobility Bed Mobility Overal bed mobility: Needs Assistance Bed Mobility: Sit to Supine     Supine to sit: Mod assist;HOB elevated;+2 for physical assistance     General bed mobility comments: assist to initiate movement and assist for sequencing and problem solving    Transfers                      Balance Overall balance assessment: Needs assistance Sitting-balance support: Feet supported Sitting balance-[name]: Poor Sitting balance - Comments: pt required up to mod A due to posterior lean   Standing balance support: Single extremity supported Standing balance-[name]: Poor Standing balance comment: requiring UE support and up to mod A                            ADL either performed or assessed with clinical judgement   ADL Overall ADL's : Needs assistance/impaired   Eating/Feeding Details (indicate cue type and reason): attempted to work on self feeding with pt, however, pt took one bite and fell asleep.  Further feeding held Grooming: Brushing hair;Moderate assistance;Sitting Grooming Details (indicate cue type and reason): pt initiates combing hair, but neglects combing the back of her hair and required assist                 Toilet Transfer: Minimal assistance;+2 for physical assistance;+2 for safety/equipment;Ambulation;Comfort height toilet;Regular Toilet;BSC;Grab bars;RW [employer] forces technical [employer] Details (indicate cue type and reason): Pt requires max cues for attention and problem solving Toileting- Clothing Manipulation and Hygiene: Maximal assistance;Sit to/from stand       Functional mobility during ADLs: Minimal assistance;+2 for physical assistance;+2 for safety/equipment       Vision       Perception     Praxis      Cognition Arousal/Alertness: Awake/alert Behavior During Therapy: Impulsive;Restless Overall Cognitive Status: Impaired/Different from baseline Area of Impairment: Orientation;Attention;Memory;Following commands;Safety/judgement;Awareness;Problem solving                 Orientation Level: Disoriented to;Place;Time;Situation Current Attention Level: Sustained (with up to max cues) Memory: Decreased short-term memory Following Commands: Follows one step commands consistently Safety/Judgement: Decreased awareness of safety;Decreased awareness of deficits Awareness: Intellectual Problem Solving: Decreased  initiation;Requires verbal cues;Difficulty sequencing;Requires tactile cues General Comments: Pt very restless today, and very distractable requiring up to max cues to attend to familiar tasks        Exercises     Shoulder Instructions       General Comments VSS    Pertinent  Vitals/ Pain       Pain Assessment: Faces Faces Pain Scale: Hurts little more Pain Location: knee Pain Descriptors / Indicators: Grimacing;Guarding;Restless Pain Intervention(s): Monitored during session;Limited activity within patient's tolerance;Repositioned  Home Living                                          Prior Functioning/Environment              Frequency  Min 2X/week        Progress Toward Goals  OT Goals(current goals can now be found in the care plan section)  Progress towards OT goals: Not progressing toward goals - comment (increased confusion this date)     Plan Discharge plan remains appropriate    Co-evaluation    PT/OT/SLP Co-Evaluation/Treatment: Yes Reason for Co-Treatment: Necessary to address cognition/behavior during functional activity;To address functional/ADL transfers;For patient/therapist safety   OT goals addressed during session: ADL's and self-care      AM-PAC OT "6 Clicks" Daily Activity     Outcome Measure   Help from another person eating meals?: A Lot Help from another person taking care of personal grooming?: A Lot Help from another person toileting, which includes using toliet, bedpan, or urinal?: A Lot Help from another person bathing (including washing, rinsing, drying)?: A Lot Help from another person to put on and taking off regular upper body clothing?: A Lot Help from another person to put on and taking off regular lower body clothing?: A Lot 6 Click Score: 12    End of Session Equipment Utilized During Treatment: Gait belt;Rolling walker  OT Visit Diagnosis: Unsteadiness on feet (R26.81);Cognitive communication deficit (R41.841) Symptoms and signs involving cognitive functions: Cerebral infarction   Activity Tolerance Patient limited by fatigue   Patient Left in chair;with call bell/phone within reach;with chair alarm set;with restraints reapplied   Nurse Communication Mobility status         Time: [id number] OT Time Calculation (min): 27 min  Charges: OT General Charges $OT Visit: 1 Visit OT Treatments $Therapeutic Activity: 8-22 mins  Nilsa Nutting., OTR/L Acute Rehabilitation Services Pager [phone] Office [phone]    Lucille Passy M 06/14/2021, 3:31 PM

## 2021-06-14 NOTE — Progress Notes (Signed)
PROGRESS NOTE    [name]   [id number]  DOB: 1941/10/05  PCP: [name], DO    DOA: 06/09/2021 LOS: 5   Assessment & Plan   Active Problems:   Stroke ([place])   Acute ischemic left MCA stroke ([place])   Middle cerebral artery embolism, left   Agitation due to dementia ([place])   Acute left MCA stroke status post mechanical thrombectomy with TICI 3 -cardioembolic etiology due to being off Xarelto in the setting of hematuria Chronic A. Fib -resumed on Xarelto Hypertension Hypokalemia /hypomagnesemia Bradycardia -resolved CT head without acute findings. CTA head and neck showed left M2 occlusion MRI brain with right ICA, left insular cortex and left frontal infarcts Echo shows EF 65 to 70% LDL 38 A1c 5.3% --Stroke team following his primary --Continue Xarelto and lovastatin --Frequent reorientation and delirium precautions --Monitor and replace electrolytes --CIR recommended for rehab, awaiting authorization  Hemoglobin decline -possibly dilutional.  No signs of active bleeding. --Monitor CBC --Monitor for signs of bleeding with Xarelto --transfuse if hemoglobin less than 7  Acute hyperactive delirium versus dementia with sundowning --Started on nightly Seroquel, continue  Generalized weakness and immobility -  --Plan is for discharge to CIR. authorization pending --Pulmonary hygiene, OOB to chair, PT and OT  Hematuria -prior to admission and resolved.  Xarelto has been resumed 8/8, had been on hold per PCP until patient saw urology. Seen by urology last week and given 3-day Cipro and told to resume Xarelto after Cipro course was finished.  History of stroke -in May of this year, left MCA scattered punctate infarcts.  MRA and carotid ultrasound negative.  EF was 60 to 65%, LDL 73, A1c was 5.5%.  Stroke etiology was deemed secondary to noncompliance with Xarelto.    History of NMS  - patient developed NMS after Haldol use during admission in May, dantrolene was  not required was monitored closely in ICU and treated with as needed Ativan. --Avoid Haldol --Monitor on Seroquel  A. Fib -rate controlled. --Resumed on Xarelto  Acute delirium with underlying dementia with behavioral disturbance-no prior formal diagnosis due to multiple cancellations and rescheduling of neurology appointments for evaluation of this. --Appointment pending in September --Baseline cognitive impairment in the home setting with sundowning, requires 24/7 supervision and assistance for bathing.  Able to eat and dress independently but slow and sometimes needs assistance. --Requiring Posey restraint due to agitation and combativeness and for safety --Continue Seroquel, dosing per psych --Monitor QTC --Psychiatry consulted  Hypertension -BP fluctuates.  Controlled on hydralazine 25 mg BID.  Hyperlipidemia -continue Lipitor 40 mg daily  Urinary retention Foley catheter -patient pulled out on 06/12/2021 --Monitor urine output closely --Bladder scans --And out cath for retention over 300 cc   Patient BMI: Body mass index is 33.86 kg/m.   DVT prophylaxis: SCD's Start: 06/09/21 1143 rivaroxaban (XARELTO) tablet 20 mg   Diet:  Diet Orders (From admission, onward)     Start     Ordered   06/10/21 1135  Diet Heart Room service appropriate? Yes; Fluid consistency: Thin  Diet effective now       Question Answer Comment  Room service appropriate? Yes   Fluid consistency: Thin      06/10/21 1135              Code Status: Full Code   Brief Narrative / Hospital Course to Date:    80 year old female with past history of chronic A. fib on Xarelto which was recently held due  to hematuria, dementia who presented to [facility] with acute onset aphasia and right-sided weakness.  Code stroke was called.  Noncontrast head CT was negative.  CTAs of head and neck showed a left M2  occlusion.  Patient was not given tPA due to recent stroke and gross materia.  She underwent mechanical  thrombectomy on 8/6 with TICI 3 revascularization.  Post procedure patient remained intubated in ICU and was admitted to [facility] service.  She was extubated on 8/7.  Required Precedex in the ICU for another 2 days, off since 8/9.  Respiratory status has been stable on room air since extubation.  TRH assumed care on 8/10.  Psychiatry is consulted and assisting with management of patient's behavioral disturbances and she has been started on Seroquel with dose adjustments being made per psych.  Subjective 06/14/21    Patient seen at bedside today.  She has Posey restraint in place but no mittens on hands today.  She is pleasant and confused per her baseline.  Nursing reports her agitation worse in AM (sun-up), unlike typical sundowning.  Pt denies pain, trouble breathing, or other complaints.    Disposition Plan & Communication   Status is: Inpatient  Remains inpatient appropriate because:Inpatient level of care appropriate due to severity of illness, with stroke status post thrombectomy.  Requires CIR rehab on discharge, evaluation pending  Dispo: The patient is from: Home              Anticipated d/c is to: CIR              Patient currently is not medically stable to d/c.   Difficult to place patient No     Consults, Procedures, Significant Events   Consultants:  Neurology PCCM  Procedures:  Mechanical thrombectomy 8/6 Intubation 8/6 Extubation 8/7  Antimicrobials:  Anti-infectives (From admission, onward)    None         Micro    Objective   Vitals:   06/14/21 1500 06/14/21 1600 06/14/21 1700 06/14/21 1800  BP:  138/77 108/84 117/72  Pulse: 95 82 92 83  Resp: (!) 28 16 (!) 28   Temp:  98.4 F (36.9 C)    TempSrc:  Axillary    SpO2: 98% 95% 97% 97%  Weight:      Height:        Intake/Output Summary (Last 24 hours) at 06/14/2021 1912 Last data filed at 06/14/2021 1430 Gross per 24 hour  Intake --  Output 450 ml  Net -450 ml   Filed Weights   06/12/21  0743  Weight: 92.3 kg    Physical Exam:  General exam: awake, alert, no acute distress, confused and restless / fidgeting in bed with posie restraint belt in place Respiratory system: CTAB, no wheezes, rales or rhonchi, normal respiratory effort. Cardiovascular system: normal S1/S2, RRR, no pedal edema.   Gastrointestinal system: soft, NT, ND Central nervous system: no gross focal neurologic deficits on exam limited by patient's dementia and inability to follow commands, normal speech Extremities: moves all, normal tone, no cyanosis Psychiatry: confused and fidgeting in bed, otherwise normal mood, abnormal judgement and insight due to dementia  Labs   Data Reviewed: I have personally reviewed following labs and imaging studies  CBC: Recent Labs  Lab 06/09/21 0854 06/09/21 1456 06/10/21 0559 06/11/21 0846 06/12/21 0339 06/13/21 0350 06/14/21 0358  WBC 5.4   < > 6.2 8.3 7.0 7.9 7.4  NEUTROABS 3.2  --  5.0  --   --  5.5  --  HGB 13.8   < > 12.3 12.5 11.5* 12.5 11.2*  HCT 40.8   < > 37.4 37.4 34.7* 37.9 34.8*  MCV 90.5   < > 90.1 90.3 90.6 91.1 92.3  PLT 317   < > 314 319 289 308 278   < > = values in this interval not displayed.   Basic Metabolic Panel: Recent Labs  Lab 06/09/21 2354 06/11/21 0846 06/12/21 0339 06/13/21 0350 06/14/21 0358  NA 136 142 139 139 140  K 3.6 3.3* 3.8 3.7 3.5  CL 105 109 110 105 105  CO2 21* '26 23 26 26  '$ GLUCOSE 133* 81 99 111* 102*  BUN '11 13 15 17 12  '$ CREATININE 0.58 0.78 0.79 0.81 0.69  CALCIUM 8.7* 8.7* 8.5* 8.6* 8.8*  MG  --  1.8  --  1.8  --   PHOS  --  2.3*  --   --   --    GFR: Estimated Creatinine Clearance: 63 mL/min (by C-G formula based on SCr of 0.69 mg/dL). Liver Function Tests: Recent Labs  Lab 06/09/21 0854 06/13/21 0350  AST 20 23  ALT 11 15  ALKPHOS 76 58  BILITOT 1.1 0.6  PROT 7.3 5.9*  ALBUMIN 3.8 3.0*   No results for input(s): LIPASE, AMYLASE in the last 168 hours. No results for input(s): AMMONIA in  the last 168 hours. Coagulation Profile: Recent Labs  Lab 06/09/21 0854  INR 1.0   Cardiac Enzymes: No results for input(s): CKTOTAL, CKMB, CKMBINDEX, TROPONINI in the last 168 hours. BNP (last 3 results) No results for input(s): PROBNP in the last 8760 hours. HbA1C: No results for input(s): HGBA1C in the last 72 hours. CBG: Recent Labs  Lab 06/09/21 0852 06/09/21 1047  GLUCAP 82 75   Lipid Profile: No results for input(s): CHOL, HDL, LDLCALC, TRIG, CHOLHDL, LDLDIRECT in the last 72 hours. Thyroid Function Tests: No results for input(s): TSH, T4TOTAL, FREET4, T3FREE, THYROIDAB in the last 72 hours. Anemia Panel: No results for input(s): VITAMINB12, FOLATE, FERRITIN, TIBC, IRON, RETICCTPCT in the last 72 hours. Sepsis Labs: No results for input(s): PROCALCITON, LATICACIDVEN in the last 168 hours.  Recent Results (from the past 240 hour(s))  Microscopic Examination     Status: Abnormal   Collection Time: 06/06/21 11:30 AM   Urine  Result Value Ref Range Status   WBC, UA 11-30 (A) 0 - 5 /hpf Final   RBC 0-2 0 - 2 /hpf Final   Epithelial Cells (non renal) 0-10 0 - 10 /hpf Final   Renal Epithel, UA 0-10 (A) None seen /hpf Final   Bacteria, UA Many (A) None seen/Few Final  CULTURE, URINE COMPREHENSIVE     Status: Abnormal   Collection Time: 06/06/21 12:55 PM   Specimen: Urine   UR  Result Value Ref Range Status   Urine Culture, Comprehensive Final report (A)  Final   Organism ID, Bacteria Escherichia coli (A)  Final    Comment: Cefazolin <=4 ug/mL Cefazolin with an MIC <=16 predicts susceptibility to the oral agents cefaclor, cefdinir, cefpodoxime, cefprozil, cefuroxime, cephalexin, and loracarbef when used for therapy of uncomplicated urinary tract infections due to E. coli, Klebsiella pneumoniae, and Proteus mirabilis. Greater than 100,000 colony forming units per mL    ANTIMICROBIAL SUSCEPTIBILITY Comment  Final    Comment:       ** S = Susceptible; I =  Intermediate; R = Resistant **  P = Positive; N = Negative             MICS are expressed in micrograms per mL    Antibiotic                 RSLT#1    RSLT#2    RSLT#3    RSLT#4 Amoxicillin/Clavulanic Acid    S Ampicillin                     S Cefepime                       S Ceftriaxone                    S Cefuroxime                     S Ciprofloxacin                  S Ertapenem                      S Gentamicin                     R Imipenem                       S Levofloxacin                   S Meropenem                      S Nitrofurantoin                 S Piperacillin/Tazobactam        S Tetracycline                   R Tobramycin                     S Trimethoprim/Sulfa             S   Resp Panel by RT-PCR (Flu A&B, Covid) Nasopharyngeal Swab     Status: None   Collection Time: 06/09/21 11:35 AM   Specimen: Nasopharyngeal Swab; Nasopharyngeal(NP) swabs in vial transport medium  Result Value Ref Range Status   SARS Coronavirus 2 by RT PCR NEGATIVE NEGATIVE Final    Comment: (NOTE) SARS-CoV-2 target nucleic acids are NOT DETECTED.  The SARS-CoV-2 RNA is generally detectable in upper respiratory specimens during the acute phase of infection. The lowest concentration of SARS-CoV-2 viral copies this assay can detect is 138 copies/mL. A negative result does not preclude SARS-Cov-2 infection and should not be used as the sole basis for treatment or other patient management decisions. A negative result may occur with  improper specimen collection/handling, submission of specimen other than nasopharyngeal swab, presence of viral mutation(s) within the areas targeted by this assay, and inadequate number of viral copies(<138 copies/mL). A negative result must be combined with clinical observations, patient history, and epidemiological information. The expected result is Negative.  Fact Sheet for Patients:   EntrepreneurPulse.com.au  Fact Sheet for Healthcare Providers:  IncredibleEmployment.be  This test is no t yet approved or cleared by the [place] FDA and  has been authorized for detection and/or diagnosis of SARS-CoV-2 by FDA under an Emergency Use Authorization (EUA). This EUA will remain  in effect (meaning this test  can be used) for the duration of the COVID-19 declaration under Section 564(b)(1) of the Act, 21 U.S.C.section 360bbb-3(b)(1), unless the authorization is terminated  or revoked sooner.       Influenza A by PCR NEGATIVE NEGATIVE Final   Influenza B by PCR NEGATIVE NEGATIVE Final    Comment: (NOTE) The Xpert Xpress SARS-CoV-2/FLU/RSV plus assay is intended as an aid in the diagnosis of influenza from Nasopharyngeal swab specimens and should not be used as a sole basis for treatment. Nasal washings and aspirates are unacceptable for Xpert Xpress SARS-CoV-2/FLU/RSV testing.  Fact Sheet for Patients: EntrepreneurPulse.com.au  Fact Sheet for Healthcare Providers: IncredibleEmployment.be  This test is not yet approved or cleared by the [place] FDA and has been authorized for detection and/or diagnosis of SARS-CoV-2 by FDA under an Emergency Use Authorization (EUA). This EUA will remain in effect (meaning this test can be used) for the duration of the COVID-19 declaration under Section 564(b)(1) of the Act, 21 U.S.C. section 360bbb-3(b)(1), unless the authorization is terminated or revoked.  Performed at [facility], [place] [address], [place], [place] [address]   MRSA Next Gen by PCR, Nasal     Status: None   Collection Time: 06/09/21  1:54 PM   Specimen: Nasal Mucosa; Nasal Swab  Result Value Ref Range Status   MRSA by PCR Next Gen NOT DETECTED NOT DETECTED Final    Comment: (NOTE) The GeneXpert MRSA Assay (FDA approved for NASAL specimens only), is one component of a  comprehensive MRSA colonization surveillance program. It is not intended to diagnose MRSA infection nor to guide or monitor treatment for MRSA infections. Test performance is not FDA approved in patients less than 58 years old. Performed at [facility], [place] [address], [place], [place] [address]       Imaging Studies   No results found.   Medications   Scheduled Meds:  atorvastatin  40 mg Oral QHS   Chlorhexidine Gluconate Cloth  6 each Topical Daily   hydrALAZINE  25 mg Oral BID   [START ON 06/15/2021] QUEtiapine  25 mg Oral Q1200   QUEtiapine  50 mg Oral QHS   [START ON 06/15/2021] QUEtiapine  50 mg Oral q AM   rivaroxaban  20 mg Oral Q supper   Continuous Infusions:  sodium chloride         LOS: 5 days    Time spent: 25 minutes      Ezekiel Slocumb, DO [facility]  06/14/2021, 7:12 PM      If 7PM-7AM, please contact night-coverage. How to contact the [facility] Attending or Consulting provider [place] or covering provider during after hours [place], for this patient?    Check the care team in [facility] and look for a) attending/consulting [facility] provider listed and b) the [facility] team listed Log into [email or web address] and use [place] universal password to access. If you do not have the password, please contact the hospital operator. Locate the [facility] provider you are looking for under [facility] and page to a number that you can be directly reached. If you still have difficulty reaching the provider, please page the [facility] (Director on Call) for the Hospitalists listed on amion for assistance.

## 2021-06-14 NOTE — Progress Notes (Signed)
Physical Therapy Treatment Patient Details Name: [name] MRN: [id number] DOB: 11/08/1940 Today's Date: 06/14/2021    History of Present Illness This 80 y.o. female admitted with acute onset Rt sided weakness and aphasia.  CTA showed acute L M2 oacclusion > thrombectomy.  MRI showed:  Multifocal acute ischemia within the right internal capsule, left frontal white matter and left insula. 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  PMH includes:  cat bite with cellulitis Lt Lower leg, HTN, h/o CVAs, mild dementia    PT Comments    The pt was seen for continued progression of OOB mobility and stability this morning. She continues to require assist of +2 to manage sit-stand transfers and short bout of ambulation in the room due to pain, and deficits in balance and strength. The pt will continue to benefit from skilled PT to progress ambulation endurance and to maintain strength for OOB mobility. Continue to recommend CIR level therapies at d/c.     Follow Up Recommendations  CIR;Supervision/Assistance - 24 hour     Equipment Recommendations  Rolling walker with 5" wheels;3in1 (PT);Wheelchair (measurements PT);Wheelchair cushion (measurements PT)    Recommendations for Other Services       Precautions / Restrictions Precautions Precautions: Fall Precaution Comments: posey, wrist restraints, watch BP Restrictions Weight Bearing Restrictions: No    Mobility  Bed Mobility Overal bed mobility: Needs Assistance Bed Mobility: Sit to Supine     Supine to sit: Mod assist;HOB elevated;+2 for physical assistance     General bed mobility comments: assist to initiate movement and assist for sequencing and problem solving    Transfers Overall transfer level: Needs assistance Equipment used: Rolling walker (2 wheeled) Transfers: Sit to/from Stand Sit to Stand: Mod assist;+2 physical assistance         General transfer comment: modA to power up for initial stand, posterior  bias  Ambulation/Gait Ambulation/Gait assistance: +2 safety/equipment;Mod assist Gait Distance (Feet): 20 Feet Assistive device: Rolling walker (2 wheeled) Gait Pattern/deviations: Decreased stride length;Trunk flexed;Step-through pattern;Shuffle Gait velocity: decreased Gait velocity interpretation: <1.31 ft/sec, indicative of household ambulator General Gait Details: Pt with shuffling gait, decreased bilateral foot clearance, poor proximity to RW. Requiring modA + 2 for balance, cues for sequencing/direction, environmental negotiation. decreased wt bearing on RLE due to c/o R knee pain    Modified Rankin (Stroke Patients Only) Modified Rankin (Stroke Patients Only) Pre-Morbid Rankin Score: No symptoms Modified Rankin: Moderately severe disability     Balance Overall balance assessment: Needs assistance Sitting-balance support: Feet supported Sitting balance-[name]: Poor Sitting balance - Comments: pt required up to mod A due to posterior lean Postural control: Posterior lean Standing balance support: Single extremity supported Standing balance-[name]: Poor Standing balance comment: requiring UE support and up to mod A                            Cognition Arousal/Alertness: Awake/alert Behavior During Therapy: Impulsive;Restless Overall Cognitive Status: Impaired/Different from baseline Area of Impairment: Orientation;Attention;Memory;Following commands;Safety/judgement;Awareness;Problem solving                 Orientation Level: Disoriented to;Place;Time;Situation Current Attention Level: Sustained Memory: Decreased short-term memory Following Commands: Follows one step commands consistently Safety/Judgement: Decreased awareness of safety;Decreased awareness of deficits Awareness: Intellectual Problem Solving: Decreased initiation;Requires verbal cues;Difficulty sequencing;Requires tactile cues General Comments: Pt very restless today, and very  distractable requiring up to max cues to attend to familiar tasks  Exercises      General Comments General comments (skin integrity, edema, etc.): VSS on RA      Pertinent Vitals/Pain Pain Assessment: No/denies pain Faces Pain Scale: Hurts little more Pain Location: knee Pain Descriptors / Indicators: Grimacing;Guarding;Restless Pain Intervention(s): Limited activity within patient's tolerance;Monitored during session;Repositioned     PT Goals (current goals can now be found in the care plan section) Acute Rehab PT Goals Patient Stated Goal: per family: for her to return to baseline PT Goal Formulation: Patient unable to participate in goal setting Time For Goal Achievement: 06/24/21 Potential to Achieve Goals: Good Progress towards PT goals: Progressing toward goals    Frequency    Min 4X/week      PT Plan Current plan remains appropriate    Co-evaluation PT/OT/SLP Co-Evaluation/Treatment: Yes Reason for Co-Treatment: Complexity of the patient's impairments (multi-system involvement);Necessary to address cognition/behavior during functional activity;For patient/therapist safety;To address functional/ADL transfers PT goals addressed during session: Balance;Mobility/safety with mobility;Proper use of DME;Strengthening/ROM OT goals addressed during session: ADL's and self-care      AM-PAC PT "6 Clicks" Mobility   Outcome Measure  Help needed turning from your back to your side while in a flat bed without using bedrails?: A Little Help needed moving from lying on your back to sitting on the side of a flat bed without using bedrails?: A Little Help needed moving to and from a bed to a chair (including a wheelchair)?: A Lot Help needed standing up from a chair using your arms (e.g., wheelchair or bedside chair)?: A Lot Help needed to walk in hospital room?: A Lot Help needed climbing 3-5 steps with a railing? : A Lot 6 Click Score: 14    End of Session Equipment  Utilized During Treatment: Gait belt Activity Tolerance: Patient tolerated treatment well Patient left: with call bell/phone within reach;with restraints reapplied;in chair;with chair alarm set (with fidget belt) Nurse Communication: Mobility status PT Visit Diagnosis: Unsteadiness on feet (R26.81);Other abnormalities of gait and mobility (R26.89);Difficulty in walking, not elsewhere classified (R26.2);Other symptoms and signs involving the nervous system (R29.898)     Time: [id number] PT Time Calculation (min) (ACUTE ONLY): 27 min  Charges:  $Gait Training: 8-22 mins                     Inocencio Homes, PT, DPT   Acute Rehabilitation Department Pager #: [phone]   Sandra Cockayne 06/14/2021, 6:21 PM

## 2021-06-15 ENCOUNTER — Telehealth: Payer: Self-pay | Admitting: Family Medicine

## 2021-06-15 DIAGNOSIS — F0391 Unspecified dementia with behavioral disturbance: Secondary | ICD-10-CM | POA: Diagnosis not present

## 2021-06-15 LAB — BASIC METABOLIC PANEL
Anion gap: 10 (ref 5–15)
BUN: 16 mg/dL (ref 8–23)
CO2: 22 mmol/L (ref 22–32)
Calcium: 8.8 mg/dL — ABNORMAL LOW (ref 8.9–10.3)
Chloride: 107 mmol/L (ref 98–111)
Creatinine, Ser: 0.64 mg/dL (ref 0.44–1.00)
GFR, Estimated: >60 mL/min (ref >60)
Glucose, Bld: 84 mg/dL (ref 70–99)
Potassium: 3.9 mmol/L (ref 3.5–5.1)
Sodium: 139 mmol/L (ref 135–145)

## 2021-06-15 MED ORDER — LACTATED RINGERS IV SOLN: INTRAVENOUS | Status: AC

## 2021-06-15 NOTE — NC FL2 (Signed)
[place] MEDICAID FL2 LEVEL OF CARE SCREENING TOOL     IDENTIFICATION  Patient Name: [name] Birthdate: October 26, 1941 Sex: female Admission Date (Current Location): 06/09/2021  [facility] and [place] Number:  Engineering geologist and Address:  [facility], [place] [address], [place], [place] [address]      Provider Number: [id number]  Attending Physician Name and Address:  Ezekiel Slocumb, DO  Relative Name and Phone Number:       Current Level of Care: Hospital Recommended Level of Care: [place] Prior Approval Number:    Date Approved/Denied:   PASRR Number: [id number]  Discharge Plan: SNF    Current Diagnoses: Patient Active Problem List   Diagnosis Date Noted   Agitation due to dementia ([place]) 06/11/2021   Stroke ([place]) 06/09/2021   Acute ischemic left MCA stroke ([place]) 06/09/2021   Middle cerebral artery embolism, left 06/09/2021   Acute ischemic stroke [facility])    Memory loss 05/28/2021   Dementia without behavioral disturbance ([place]) 05/28/2021   Neuroleptic malignant syndrome 04/01/2021   History of CVA with residual deficit 03/30/2021   Acute confusion 03/29/2021   Delirium 03/29/2021   Cellulitis 03/29/2021   Chronic venous insufficiency 10/14/2016   Atrial fibrillation ([place]) 06/15/2015   Rash 06/15/2015   HTN (hypertension) 04/11/2015   Osteoarthritis 04/11/2015    Orientation RESPIRATION BLADDER Height & Weight     Self  Normal Incontinent Weight: 203 lb 7.8 oz (92.3 kg) Height:  '5\' 5"'$  (165.1 cm)  BEHAVIORAL SYMPTOMS/MOOD NEUROLOGICAL BOWEL NUTRITION STATUS      Continent Diet (heart healthy)  AMBULATORY STATUS COMMUNICATION OF NEEDS Skin   Extensive Assist   Surgical wounds (closed right groin, no dressing)                       Personal Care Assistance Level of Assistance  Bathing, Feeding, Dressing Bathing Assistance: Limited assistance Feeding assistance: Limited assistance Dressing Assistance:  Limited assistance     Functional Limitations Info             [place]  PT (By licensed PT), OT (By licensed OT)     PT Frequency: 5x/wk OT Frequency: 5x/wk            Contractures Contractures Info: Not present    Additional Factors Info  Allergies, Code Status, Psychotropic Code Status Info: Full Allergies Info: Amlodipine, Haldol (Haloperidol Lactate), Hctz (Hydrochlorothiazide) Psychotropic Info: Seroquel '50mg'$  every morning and at bedtime, Seroquel '25mg'$  daily         Current Medications (06/15/2021):  This is the current hospital active medication list Current Facility-Administered Medications  Medication Dose Route Frequency Provider Last Rate Last Admin   0.9 %  sodium chloride infusion  250 mL Intravenous Continuous Donnetta Simpers, MD       acetaminophen (TYLENOL) tablet 650 mg  650 mg Oral Q4H PRN Deveshwar, Willaim Rayas, MD       Or   acetaminophen (TYLENOL) 160 MG/5ML solution 650 mg  650 mg Per Tube Q4H PRN Deveshwar, Willaim Rayas, MD       Or   acetaminophen (TYLENOL) suppository 650 mg  650 mg Rectal Q4H PRN Deveshwar, Sanjeev, MD       atorvastatin (LIPITOR) tablet 40 mg  40 mg Oral QHS Rosalin Hawking, MD   40 mg at 06/14/21 2111   Chlorhexidine Gluconate Cloth 2 % PADS 6 each  6 each Topical Daily Luanne Bras, MD  6 each at 06/15/21 1056   hydrALAZINE (APRESOLINE) injection 10 mg  10 mg Intravenous Q6H PRN Margaretmary Lombard, MD   10 mg at 06/13/21 0549   hydrALAZINE (APRESOLINE) tablet 25 mg  25 mg Oral BID Anders Simmonds, MD   25 mg at 06/15/21 1048   lactated ringers infusion   Intravenous Continuous Nicole Kindred A, DO 75 mL/hr at 06/15/21 1055 New Bag at 06/15/21 1055   LORazepam (ATIVAN) injection 1 mg  1 mg Intravenous Q6H PRN Rosalin Hawking, MD   1 mg at 06/14/21 2111   QUEtiapine (SEROQUEL) tablet 25 mg  25 mg Oral Q1200 Rosalin Hawking, MD   25 mg at 06/15/21 1100   QUEtiapine (SEROQUEL) tablet 50 mg  50 mg Oral QHS Rosalin Hawking, MD    50 mg at 06/14/21 2111   QUEtiapine (SEROQUEL) tablet 50 mg  50 mg Oral q AM Rosalin Hawking, MD   50 mg at 06/15/21 0731   rivaroxaban (XARELTO) tablet 20 mg  20 mg Oral Q supper Rosalin Hawking, MD   20 mg at 06/14/21 1656   senna-docusate (Senokot-S) tablet 1 tablet  1 tablet Oral QHS PRN Donnetta Simpers, MD         Discharge Medications: Please see discharge summary for a list of discharge medications.  Relevant Imaging Results:  Relevant Lab Results:   Additional Information SS#: [id number]  Geralynn Ochs, [name]

## 2021-06-15 NOTE — Progress Notes (Signed)
Inpatient Rehab Admissions Coordinator:   Received notice from [facility] of intent to deny request for CIR.  Dr. Naaman Plummer reviewed case and felt that it would be hard to argue in favor of CIR.  Pt will need to seek f/u therapy in a lower level of care. I let her daughter in law know and updated TOC team and Dr. Arbutus Ped.    Shann Medal, PT, DPT Admissions Coordinator [phone] 06/15/21  2:50 PM

## 2021-06-15 NOTE — Consult Note (Signed)
   [facility] CM Inpatient Consult   06/15/2021  Kylenn Kuchta Brannigan [birth year] [id number]  [place] [facility] [ACO] Patient:   Primary Care Provider:  [name], DO  does the [facility] [place], an Embedded provider with a Chronic Care Management team and program   Patient screened for hospitalization with noted high risk score for unplanned readmission risk and to assess for potential [place] Management service needs for post hospital transition.  Review of patient's medical record reveals patient is being recommended for an inpatient rehab   Plan:  Continue to follow progress and disposition to assess for post hospital care management needs.    For questions contact:   Natividad Brood, RN BSN [facility] Liaison  [phone] business mobile phone Toll free office [phone]  Fax number: [phone] [email or web address]

## 2021-06-15 NOTE — Progress Notes (Signed)
Inpatient Rehab Admissions Coordinator:   Still awaiting determination from [facility].  Anticipate response today.   Shann Medal, PT, DPT Admissions Coordinator [phone] 06/15/21  1:55 PM

## 2021-06-15 NOTE — Progress Notes (Signed)
PROGRESS NOTE    [name]   [id number]  DOB: 19-May-1941  PCP: Lisa Roys, DO    DOA: 06/09/2021 LOS: 6   Assessment & Plan   Active Problems:   Stroke ([place])   Acute ischemic left MCA stroke ([place])   Middle cerebral artery embolism, left   Agitation due to dementia ([place])   Acute left MCA stroke status post mechanical thrombectomy with TICI 3 -cardioembolic etiology due to being off Xarelto in the setting of hematuria Chronic A. Fib -resumed on Xarelto Hypertension Hypokalemia /hypomagnesemia Bradycardia -resolved CT head without acute findings. CTA head and neck showed left M2 occlusion MRI brain with right ICA, left insular cortex and left frontal infarcts Echo shows EF 65 to 70% LDL 38 A1c 5.3% --Stroke team following his primary --Continue Xarelto and lovastatin --Frequent reorientation and delirium precautions --Monitor and replace electrolytes --CIR recommended for rehab, awaiting authorization  Hemoglobin decline -possibly dilutional.  No signs of active bleeding. --Monitor CBC --Monitor for signs of bleeding with Xarelto --transfuse if hemoglobin less than 7  Acute hyperactive delirium versus dementia with sundowning --Started on nightly Seroquel, continue  Generalized weakness and immobility -  -- Insurance declined authorization for CIR.  -- SNF placement pending, TOC following --Pulmonary hygiene, OOB to chair, PT and OT  Hematuria -prior to admission and resolved.  Xarelto has been resumed 8/8, had been on hold per PCP until patient saw urology.  Seen by urology last week (PTA), given 3-day Cipro, told to resume Xarelto after Cipro course was finished.  History of stroke -in May of this year, left MCA scattered punctate infarcts.  MRA and carotid ultrasound negative.  EF was 60 to 65%, LDL 73, A1c was 5.5%.  Stroke etiology was deemed secondary to noncompliance with Xarelto.    History of NMS  - patient developed NMS after Haldol use during  admission in May, dantrolene was not required was monitored closely in ICU and treated with as needed Ativan. --Avoid Haldol --Monitor on Seroquel  A. Fib -rate controlled. --Resumed on Xarelto  Acute delirium with underlying dementia with behavioral disturbance-no prior formal diagnosis due to multiple cancellations and rescheduling of neurology appointments for evaluation of this. --Appointment pending in September --Baseline cognitive impairment in the home setting with sundowning, requires 24/7 supervision and assistance for bathing.  Able to eat and dress independently but slow and sometimes needs assistance. --Requiring Posey restraint due to agitation and combativeness and for safety --Continue Seroquel, dosing per psych --Monitor QTC --Psychiatry consulted  Hypertension -BP fluctuates.  Controlled on hydralazine 25 mg BID.  Hyperlipidemia -continue Lipitor 40 mg daily  Urinary retention Foley catheter -patient pulled out on 06/12/2021 --Monitor urine output closely --Bladder scans --And out cath for retention over 300 cc   Patient BMI: Body mass index is 33.86 kg/m.   DVT prophylaxis: SCD's Start: 06/09/21 1143 rivaroxaban (XARELTO) tablet 20 mg   Diet:  Diet Orders (From admission, onward)     Start     Ordered   06/10/21 1135  Diet Heart Room service appropriate? Yes; Fluid consistency: Thin  Diet effective now       Question Answer Comment  Room service appropriate? Yes   Fluid consistency: Thin      06/10/21 1135              Code Status: Full Code   Brief Narrative / Hospital Course to Date:    80 year old female with past history of chronic A. fib on Xarelto  which was recently held due to hematuria, dementia who presented to [facility] with acute onset aphasia and right-sided weakness.  Code stroke was called.  Noncontrast head CT was negative.  CTAs of head and neck showed a left M2  occlusion.  Patient was not given tPA due to recent stroke and gross  materia.  She underwent mechanical thrombectomy on 8/6 with TICI 3 revascularization.  Post procedure patient remained intubated in ICU and was admitted to [facility] service.  She was extubated on 8/7.  Required Precedex in the ICU for another 2 days, off since 8/9.  Respiratory status has been stable on room air since extubation.  TRH assumed care on 8/10.  Psychiatry is consulted and assisting with management of patient's behavioral disturbances and she has been started on Seroquel with dose adjustments being made per psych.  Subjective 06/15/21    Patient seen with bedside RN present this morning.  Patient is finally sleeping.  Reportedly has slept very little and been awake all night every night and agitated when awake.  No acute events reported.   Disposition Plan & Communication   Status is: Inpatient  Remains inpatient appropriate because:Inpatient level of care appropriate due to severity of illness, with stroke status post thrombectomy.  Requires CIR rehab on discharge, evaluation pending  Dispo: The patient is from: Home              Anticipated d/c is to: SNF              Patient currently is not medically stable to d/c.   Difficult to place patient No   Consults, Procedures, Significant Events   Consultants:  Neurology PCCM  Procedures:  Mechanical thrombectomy 8/6 Intubation 8/6 Extubation 8/7  Antimicrobials:  Anti-infectives (From admission, onward)    None         Micro    Objective   Vitals:   06/15/21 1200 06/15/21 1300 06/15/21 1400 06/15/21 1500  BP: (!) 153/138 (!) 156/140 (!) 156/126 (!) 152/122  Pulse: 80 (!) 105 (!) 111 (!) 104  Resp: (!) 23   (!) 27  Temp: 97.9 F (36.6 C)     TempSrc: Oral     SpO2: 99% 99% 94% 97%  Weight:      Height:        Intake/Output Summary (Last 24 hours) at 06/15/2021 1548 Last data filed at 06/15/2021 1341 Gross per 24 hour  Intake 5.55 ml  Output 550 ml  Net -544.45 ml   Filed Weights   06/12/21 0743   Weight: 92.3 kg    Physical Exam:  General exam: sleeping comfortably, no acute distress, occasionally fidgeting in her sleep Respiratory system: normal respiratory effort, symmetric chest rise. Cardiovascular system: RRR, no pedal edema.   Central nervous system: unable to evaluate, pt somnolent Extremities: no edema, no cyanosis  Labs   Data Reviewed: I have personally reviewed following labs and imaging studies  CBC: Recent Labs  Lab 06/09/21 0854 06/09/21 1456 06/10/21 0559 06/11/21 0846 06/12/21 0339 06/13/21 0350 06/14/21 0358  WBC 5.4   < > 6.2 8.3 7.0 7.9 7.4  NEUTROABS 3.2  --  5.0  --   --  5.5  --   HGB 13.8   < > 12.3 12.5 11.5* 12.5 11.2*  HCT 40.8   < > 37.4 37.4 34.7* 37.9 34.8*  MCV 90.5   < > 90.1 90.3 90.6 91.1 92.3  PLT 317   < > 314 319 289 308 278   < > =  values in this interval not displayed.   Basic Metabolic Panel: Recent Labs  Lab 06/11/21 0846 06/12/21 0339 06/13/21 0350 06/14/21 0358 06/15/21 1040  NA 142 139 139 140 139  K 3.3* 3.8 3.7 3.5 3.9  CL 109 110 105 105 107  CO2 '26 23 26 26 22  '$ GLUCOSE 81 99 111* 102* 84  BUN '13 15 17 12 16  '$ CREATININE 0.78 0.79 0.81 0.69 0.64  CALCIUM 8.7* 8.5* 8.6* 8.8* 8.8*  MG 1.8  --  1.8  --   --   PHOS 2.3*  --   --   --   --    GFR: Estimated Creatinine Clearance: 63 mL/min (by C-G formula based on SCr of 0.64 mg/dL). Liver Function Tests: Recent Labs  Lab 06/09/21 0854 06/13/21 0350  AST 20 23  ALT 11 15  ALKPHOS 76 58  BILITOT 1.1 0.6  PROT 7.3 5.9*  ALBUMIN 3.8 3.0*   No results for input(s): LIPASE, AMYLASE in the last 168 hours. No results for input(s): AMMONIA in the last 168 hours. Coagulation Profile: Recent Labs  Lab 06/09/21 0854  INR 1.0   Cardiac Enzymes: No results for input(s): CKTOTAL, CKMB, CKMBINDEX, TROPONINI in the last 168 hours. BNP (last 3 results) No results for input(s): PROBNP in the last 8760 hours. HbA1C: No results for input(s): HGBA1C in the last  72 hours. CBG: Recent Labs  Lab 06/09/21 0852 06/09/21 1047  GLUCAP 82 75   Lipid Profile: No results for input(s): CHOL, HDL, LDLCALC, TRIG, CHOLHDL, LDLDIRECT in the last 72 hours. Thyroid Function Tests: No results for input(s): TSH, T4TOTAL, FREET4, T3FREE, THYROIDAB in the last 72 hours. Anemia Panel: No results for input(s): VITAMINB12, FOLATE, FERRITIN, TIBC, IRON, RETICCTPCT in the last 72 hours. Sepsis Labs: No results for input(s): PROCALCITON, LATICACIDVEN in the last 168 hours.  Recent Results (from the past 240 hour(s))  Microscopic Examination     Status: Abnormal   Collection Time: 06/06/21 11:30 AM   Urine  Result Value Ref Range Status   WBC, UA 11-30 (A) 0 - 5 /hpf Final   RBC 0-2 0 - 2 /hpf Final   Epithelial Cells (non renal) 0-10 0 - 10 /hpf Final   Renal Epithel, UA 0-10 (A) None seen /hpf Final   Bacteria, UA Many (A) None seen/Few Final  CULTURE, URINE COMPREHENSIVE     Status: Abnormal   Collection Time: 06/06/21 12:55 PM   Specimen: Urine   UR  Result Value Ref Range Status   Urine Culture, Comprehensive Final report (A)  Final   Organism ID, Bacteria Escherichia coli (A)  Final    Comment: Cefazolin <=4 ug/mL Cefazolin with an MIC <=16 predicts susceptibility to the oral agents cefaclor, cefdinir, cefpodoxime, cefprozil, cefuroxime, cephalexin, and loracarbef when used for therapy of uncomplicated urinary tract infections due to E. coli, Klebsiella pneumoniae, and Proteus mirabilis. Greater than 100,000 colony forming units per mL    ANTIMICROBIAL SUSCEPTIBILITY Comment  Final    Comment:       ** S = Susceptible; I = Intermediate; R = Resistant **                    P = Positive; N = Negative             MICS are expressed in micrograms per mL    Antibiotic                 RSLT#1  RSLT#2    RSLT#3    RSLT#4 Amoxicillin/Clavulanic Acid    S Ampicillin                     S Cefepime                       S Ceftriaxone                     S Cefuroxime                     S Ciprofloxacin                  S Ertapenem                      S Gentamicin                     R Imipenem                       S Levofloxacin                   S Meropenem                      S Nitrofurantoin                 S Piperacillin/Tazobactam        S Tetracycline                   R Tobramycin                     S Trimethoprim/Sulfa             S   Resp Panel by RT-PCR (Flu A&B, Covid) Nasopharyngeal Swab     Status: None   Collection Time: 06/09/21 11:35 AM   Specimen: Nasopharyngeal Swab; Nasopharyngeal(NP) swabs in vial transport medium  Result Value Ref Range Status   SARS Coronavirus 2 by RT PCR NEGATIVE NEGATIVE Final    Comment: (NOTE) SARS-CoV-2 target nucleic acids are NOT DETECTED.  The SARS-CoV-2 RNA is generally detectable in upper respiratory specimens during the acute phase of infection. The lowest concentration of SARS-CoV-2 viral copies this assay can detect is 138 copies/mL. A negative result does not preclude SARS-Cov-2 infection and should not be used as the sole basis for treatment or other patient management decisions. A negative result may occur with  improper specimen collection/handling, submission of specimen other than nasopharyngeal swab, presence of viral mutation(s) within the areas targeted by this assay, and inadequate number of viral copies(<138 copies/mL). A negative result must be combined with clinical observations, patient history, and epidemiological information. The expected result is Negative.  Fact Sheet for Patients:  EntrepreneurPulse.com.au  Fact Sheet for Healthcare Providers:  IncredibleEmployment.be  This test is no t yet approved or cleared by the [place] FDA and  has been authorized for detection and/or diagnosis of SARS-CoV-2 by FDA under an Emergency Use Authorization (EUA). This EUA will remain  in effect (meaning this test can be  used) for the duration of the COVID-19 declaration under Section 564(b)(1) of the Act, 21 U.S.C.section 360bbb-3(b)(1), unless the authorization is terminated  or revoked sooner.       Influenza A by PCR NEGATIVE NEGATIVE Final   Influenza B by PCR NEGATIVE  NEGATIVE Final    Comment: (NOTE) The Xpert Xpress SARS-CoV-2/FLU/RSV plus assay is intended as an aid in the diagnosis of influenza from Nasopharyngeal swab specimens and should not be used as a sole basis for treatment. Nasal washings and aspirates are unacceptable for Xpert Xpress SARS-CoV-2/FLU/RSV testing.  Fact Sheet for Patients: EntrepreneurPulse.com.au  Fact Sheet for Healthcare Providers: IncredibleEmployment.be  This test is not yet approved or cleared by the [place] FDA and has been authorized for detection and/or diagnosis of SARS-CoV-2 by FDA under an Emergency Use Authorization (EUA). This EUA will remain in effect (meaning this test can be used) for the duration of the COVID-19 declaration under Section 564(b)(1) of the Act, 21 U.S.C. section 360bbb-3(b)(1), unless the authorization is terminated or revoked.  Performed at [facility], [place] [address], [place], [place] [address]   MRSA Next Gen by PCR, Nasal     Status: None   Collection Time: 06/09/21  1:54 PM   Specimen: Nasal Mucosa; Nasal Swab  Result Value Ref Range Status   MRSA by PCR Next Gen NOT DETECTED NOT DETECTED Final    Comment: (NOTE) The GeneXpert MRSA Assay (FDA approved for NASAL specimens only), is one component of a comprehensive MRSA colonization surveillance program. It is not intended to diagnose MRSA infection nor to guide or monitor treatment for MRSA infections. Test performance is not FDA approved in patients less than 3 years old. Performed at [facility], [place] [address], [place], [place] [address]       Imaging Studies   No results found.   Medications    Scheduled Meds:  atorvastatin  40 mg Oral QHS   Chlorhexidine Gluconate Cloth  6 each Topical Daily   hydrALAZINE  25 mg Oral BID   QUEtiapine  25 mg Oral Q1200   QUEtiapine  50 mg Oral QHS   QUEtiapine  50 mg Oral q AM   rivaroxaban  20 mg Oral Q supper   Continuous Infusions:  sodium chloride     lactated ringers 75 mL/hr at 06/15/21 1055       LOS: 6 days    Time spent: 25 minutes with > 50% spent at bedside and in coordination of care      Ezekiel Slocumb, DO [facility]  06/15/2021, 3:48 PM      If 7PM-7AM, please contact night-coverage. How to contact the [facility] Attending or Consulting provider [place] or covering provider during after hours [place], for this patient?    Check the care team in [facility] and look for a) attending/consulting TRH provider listed and b) the [facility] team listed Log into [email or web address] and use [place]'s universal password to access. If you do not have the password, please contact the hospital operator. Locate the [facility] provider you are looking for under [facility] and page to a number that you can be directly reached. If you still have difficulty reaching the provider, please page the [facility] (Director on Call) for the Hospitalists listed on amion for assistance.

## 2021-06-15 NOTE — TOC Progression Note (Signed)
Transition of Care [facility]) - Progression Note    Patient Details  Name: [name] MRN: [id number] Date of Birth: January 25, 1941  Transition of Care [facility]) CM/SW [place], [place] Phone Number: 06/15/2021, 3:03 PM  Clinical Narrative:   CSW notified by Rehab Admissions that [facility] has issued a denial for CIR, and family agreeable to SNF, preference for [facility]. CSW completed referral and sent to [facility] for review. CSW to follow.    Expected Discharge Plan: [place] Barriers to Discharge: Continued Medical Work up, Ship broker  Expected Discharge Plan and Services Expected Discharge Plan: [place]   Discharge Planning Services: CM Consult   Living arrangements for the past 2 months: Single Family Home                                       Social Determinants of Health (SDOH) Interventions    Readmission Risk Interventions No flowsheet data found.

## 2021-06-15 NOTE — Telephone Encounter (Signed)
Copied from [place] [id number]. Topic: Medicare AWV >> Jun 15, 2021  4:02 PM Lavonia Drafts wrote: Left message for patient to call back and schedule the Medicare Annual Wellness Visit (AWV) virtually or by telephone.  Last AWV 08/30/19  Please schedule at anytime with [facility]FP-Nurse Health Advisor.  45 minute appointment  Any questions, please call me at [phone]  Reason for CRM:

## 2021-06-15 NOTE — Plan of Care (Signed)
  Problem: Safety: Goal: Non-violent Restraint(s) Outcome: Progressing   Problem: Education: Goal: Knowledge of disease or condition will improve Outcome: Progressing Goal: Knowledge of secondary prevention will improve Outcome: Progressing Goal: Knowledge of patient specific risk factors addressed and post discharge goals established will improve Outcome: Progressing Goal: Individualized Educational Video(s) Outcome: Progressing   Problem: Coping: Goal: Will identify appropriate support needs Outcome: Progressing   Problem: Health Behavior/Discharge Planning: Goal: Ability to manage health-related needs will improve Outcome: Progressing   Problem: Self-Care: Goal: Ability to participate in self-care as condition permits will improve Outcome: Progressing Goal: Verbalization of feelings and concerns over difficulty with self-care will improve Outcome: Progressing Goal: Ability to communicate needs accurately will improve Outcome: Progressing   Problem: Nutrition: Goal: Risk of aspiration will decrease Outcome: Progressing Goal: Dietary intake will improve Outcome: Progressing   Problem: Ischemic Stroke/TIA Tissue Perfusion: Goal: Complications of ischemic stroke/TIA will be minimized Outcome: Progressing   Problem: Education: Goal: Knowledge of General Education information will improve Description: Including pain rating scale, medication(s)/side effects and non-pharmacologic comfort measures Outcome: Progressing   Problem: Health Behavior/Discharge Planning: Goal: Ability to manage health-related needs will improve Outcome: Progressing   Problem: Clinical Measurements: Goal: Ability to maintain clinical measurements within normal limits will improve Outcome: Progressing Goal: Will remain free from infection Outcome: Progressing Goal: Diagnostic test results will improve Outcome: Progressing Goal: Respiratory complications will improve Outcome:  Progressing Goal: Cardiovascular complication will be avoided Outcome: Progressing   Problem: Activity: Goal: Risk for activity intolerance will decrease Outcome: Progressing   Problem: Nutrition: Goal: Adequate nutrition will be maintained Outcome: Progressing   Problem: Coping: Goal: Level of anxiety will decrease Outcome: Progressing   Problem: Elimination: Goal: Will not experience complications related to bowel motility Outcome: Progressing Goal: Will not experience complications related to urinary retention Outcome: Progressing   Problem: Pain Managment: Goal: General experience of comfort will improve Outcome: Progressing   Problem: Safety: Goal: Ability to remain free from injury will improve Outcome: Progressing   Problem: Skin Integrity: Goal: Risk for impaired skin integrity will decrease Outcome: Progressing

## 2021-06-15 NOTE — Progress Notes (Signed)
  Speech Language Pathology Treatment: Cognitive-Linquistic  Patient Details Name: Shaniquia Dougal Krouse MRN: [id number] DOB: [birth year] Today's Date: 06/15/2021 Time: [id number] SLP Time Calculation (min) (ACUTE ONLY): 25 min  Assessment / Plan / Recommendation Clinical Impression  Pt seen for aphasia tx with pt able to follow simple 1-step commands with min verbal cues, but breakdown occurred when 2 or more step directives required during functional tasks.  Pt unable to name simple objects when asked given mod semantic/phrase completion/visual cues with 20% accuracy achieved overall.  Pt answered personal information questions with 30% accuracy and was oriented to name only.  Pt agitated and potentially experiencing hospital delirium with compromised cognitive abilities related to memory/attention within simple tasks. Family contacted (Daughter-in-law, [name]) who reported pt was able to follow 2-3 step commands prior to this hospitalization and pt could communicate effectively.  Memory compromised prior as daughter-in-law reported pt's dementia symptoms waxed and wanted from "mild-mod depending on the day."  ST will continue to f/u for cog/linguistic deficits while in acute setting and caregiver education.  HPI HPI: Freya Radermacher Delgreco is a 80 y.o. female with PMH significant for A. fib, recently taken off Xarelto for gross hematuria, hypertension, dementia (sundowning and requiring 24 hour supervision), multiple prior ischemic stroke with most recent on 5/26 who presented to [facility] emergency department with acute onset aphasia and right-sided weakness. CTA was obtained with left MCA M2 occlusion with 51 cc penumbra. Patient was transferred to [facility] for thrombectomy.  MRI of the brain was showing multifocal acute ischemia within the right internal capsule, left frontal white matter and left insula as well as old left occipital and frontal infarcts.  She was intubated briefly.  Per RN family reported  onset of cognitive issues in May.  She was scheduled to see a neurologist.      [place] with current plan of care       Recommendations   SNF                SLP Visit Diagnosis: Aphasia (R47.01);Cognitive communication deficit [id number]) Plan: Continue with current plan of care                       Elvina Sidle, M.S., CCC-SLP 06/15/2021, 6:20 PM

## 2021-06-16 DIAGNOSIS — F0391 Unspecified dementia with behavioral disturbance: Secondary | ICD-10-CM | POA: Diagnosis not present

## 2021-06-16 MED ORDER — LACTATED RINGERS IV SOLN: INTRAVENOUS | Status: AC

## 2021-06-16 MED ORDER — TAMSULOSIN HCL 0.4 MG PO CAPS
0.4000 mg | ORAL_CAPSULE | Freq: Every day | ORAL | Status: DC
  Administered 2021-06-16 – 2021-07-03 (×16): 0.4 mg via ORAL
  Filled 2021-06-16 (×19): qty 1

## 2021-06-16 NOTE — Progress Notes (Signed)
PROGRESS NOTE    Manjot Peiffer Lattin   [id number]  DOB: 01-08-1941  PCP: [name], DO    DOA: 06/09/2021 LOS: 7   Assessment & Plan   Active Problems:   Stroke ([place])   Acute ischemic left MCA stroke ([place])   Middle cerebral artery embolism, left   Agitation due to dementia ([place])   Acute left MCA stroke status post mechanical thrombectomy with TICI 3 -cardioembolic etiology due to being off Xarelto in the setting of hematuria Chronic A. Fib -resumed on Xarelto Hypertension Hypokalemia /hypomagnesemia Bradycardia -resolved CT head without acute findings. CTA head and neck showed left M2 occlusion MRI brain with right ICA, left insular cortex and left frontal infarcts Echo shows EF 65 to 70% LDL 38 A1c 5.3% --Stroke team following his primary --Continue Xarelto and lovastatin --Frequent reorientation and delirium precautions --Monitor and replace electrolytes --CIR recommended for rehab, awaiting authorization  Hemoglobin decline -possibly dilutional.  No signs of active bleeding. --Monitor CBC --Monitor for signs of bleeding with Xarelto --transfuse if hemoglobin less than 7  Acute hyperactive delirium versus dementia with sundowning --Started on nightly Seroquel, continue  Generalized weakness and immobility -  -- Insurance declined authorization for CIR.  -- SNF placement pending, TOC following --Pulmonary hygiene, OOB to chair, PT and OT  Hematuria -prior to admission and resolved.  Xarelto has been resumed 8/8, had been on hold per PCP until patient saw urology.  Seen by urology last week (PTA), given 3-day Cipro, told to resume Xarelto after Cipro course was finished.  History of stroke -in May of this year, left MCA scattered punctate infarcts.  MRA and carotid ultrasound negative.  EF was 60 to 65%, LDL 73, A1c was 5.5%.  Stroke etiology was deemed secondary to noncompliance with Xarelto.    History of NMS  - patient developed NMS after Haldol use during  admission in May, dantrolene was not required was monitored closely in ICU and treated with as needed Ativan. --Avoid Haldol --Monitor on Seroquel  A. Fib -rate controlled. --Resumed on Xarelto  Acute delirium with underlying dementia with behavioral disturbance-no prior formal diagnosis due to multiple cancellations and rescheduling of neurology appointments for evaluation of this. --Appointment pending in September --Baseline cognitive impairment in the home setting with sundowning, requires 24/7 supervision and assistance for bathing.  Able to eat and dress independently but slow and sometimes needs assistance. --Requiring Posey restraint due to agitation and combativeness and for safety --Continue Seroquel, dosing per psych --Monitor QTC --Psychiatry consulted  Hypertension -BP fluctuates.  Controlled on hydralazine 25 mg BID.  Hyperlipidemia -continue Lipitor 40 mg daily  Urinary retention Foley catheter -patient pulled out on 06/12/2021 --Monitor urine output closely --Bladder scans --And out cath for retention over 300 cc   Patient BMI: Body mass index is 33.86 kg/m.   DVT prophylaxis: SCD's Start: 06/09/21 1143 rivaroxaban (XARELTO) tablet 20 mg   Diet:  Diet Orders (From admission, onward)     Start     Ordered   06/10/21 1135  Diet Heart Room service appropriate? Yes; Fluid consistency: Thin  Diet effective now       Question Answer Comment  Room service appropriate? Yes   Fluid consistency: Thin      06/10/21 1135              Code Status: Full Code   Brief Narrative / Hospital Course to Date:    80 year old female with past history of chronic A. fib on Xarelto  which was recently held due to hematuria, dementia who presented to [facility] with acute onset aphasia and right-sided weakness.  Code stroke was called.  Noncontrast head CT was negative.  CTAs of head and neck showed a left M2  occlusion.  Patient was not given tPA due to recent stroke and gross  materia.  She underwent mechanical thrombectomy on 8/6 with TICI 3 revascularization.  Post procedure patient remained intubated in ICU and was admitted to [facility] service.  She was extubated on 8/7.  Required Precedex in the ICU for another 2 days, off since 8/9.  Respiratory status has been stable on room air since extubation.  TRH assumed care on 8/10.  Psychiatry is consulted and assisting with management of patient's behavioral disturbances and she has been started on Seroquel with dose adjustments being made per psych.  Subjective 06/16/21    Patient awake sitting up in bed when seen today.  She appears less restless but still a bit fidgety.  Answers questions appropriately today.  She denies pain, trouble breathing or anything else bothering her at this time.  No acute events reported.     Disposition Plan & Communication   Status is: Inpatient  Remains inpatient appropriate because:Inpatient level of care appropriate due to severity of illness, with stroke status post thrombectomy.  Requires SNF rehab on discharge, placement pending  Dispo: The patient is from: Home              Anticipated d/c is to: SNF              Patient currently is medically stable for discharge   Difficult to place patient No   Consults, Procedures, Significant Events   Consultants:  Neurology PCCM  Procedures:  Mechanical thrombectomy 8/6 Intubation 8/6 Extubation 8/7  Antimicrobials:  Anti-infectives (From admission, onward)    None         Micro    Objective   Vitals:   06/15/21 2329 06/16/21 0300 06/16/21 0819 06/16/21 1127  BP: (!) 126/58 135/77 127/65 128/70  Pulse: 72 80 60 75  Resp: '20 18 16 18  '$ Temp: 99.1 F (37.3 C) 98.6 F (37 C) 99.6 F (37.6 C) 99.5 F (37.5 C)  TempSrc: Oral Oral Oral Oral  SpO2: 96% 99% 99% 97%  Weight:      Height:        Intake/Output Summary (Last 24 hours) at 06/16/2021 1433 Last data filed at 06/16/2021 [id number] Gross per 24 hour  Intake --   Output 500 ml  Net -500 ml   Filed Weights   06/12/21 0743  Weight: 92.3 kg    Physical Exam:  General exam: awake and alert, no acute distress, appears mildly restless but less so than previous days Respiratory system: CTAB, normal respiratory effort, symmetric chest rise. GI: soft and non-tender, +BS Cardiovascular system: RRR, no pedal edema.   Central nervous system: follows commands, normal speech Extremities: no edema, normal tone  Labs   Data Reviewed: I have personally reviewed following labs and imaging studies  CBC: Recent Labs  Lab 06/10/21 0559 06/11/21 0846 06/12/21 0339 06/13/21 0350 06/14/21 0358  WBC 6.2 8.3 7.0 7.9 7.4  NEUTROABS 5.0  --   --  5.5  --   HGB 12.3 12.5 11.5* 12.5 11.2*  HCT 37.4 37.4 34.7* 37.9 34.8*  MCV 90.1 90.3 90.6 91.1 92.3  PLT 314 319 289 308 0000000   Basic Metabolic Panel: Recent Labs  Lab 06/11/21 0846 06/12/21 0339 06/13/21 0350  06/14/21 0358 06/15/21 1040  NA 142 139 139 140 139  K 3.3* 3.8 3.7 3.5 3.9  CL 109 110 105 105 107  CO2 '26 23 26 26 22  '$ GLUCOSE 81 99 111* 102* 84  BUN '13 15 17 12 16  '$ CREATININE 0.78 0.79 0.81 0.69 0.64  CALCIUM 8.7* 8.5* 8.6* 8.8* 8.8*  MG 1.8  --  1.8  --   --   PHOS 2.3*  --   --   --   --    GFR: Estimated Creatinine Clearance: 63 mL/min (by C-G formula based on SCr of 0.64 mg/dL). Liver Function Tests: Recent Labs  Lab 06/13/21 0350  AST 23  ALT 15  ALKPHOS 58  BILITOT 0.6  PROT 5.9*  ALBUMIN 3.0*   No results for input(s): LIPASE, AMYLASE in the last 168 hours. No results for input(s): AMMONIA in the last 168 hours. Coagulation Profile: No results for input(s): INR, PROTIME in the last 168 hours.  Cardiac Enzymes: No results for input(s): CKTOTAL, CKMB, CKMBINDEX, TROPONINI in the last 168 hours. BNP (last 3 results) No results for input(s): PROBNP in the last 8760 hours. HbA1C: No results for input(s): HGBA1C in the last 72 hours. CBG: No results for input(s):  GLUCAP in the last 168 hours.  Lipid Profile: No results for input(s): CHOL, HDL, LDLCALC, TRIG, CHOLHDL, LDLDIRECT in the last 72 hours. Thyroid Function Tests: No results for input(s): TSH, T4TOTAL, FREET4, T3FREE, THYROIDAB in the last 72 hours. Anemia Panel: No results for input(s): VITAMINB12, FOLATE, FERRITIN, TIBC, IRON, RETICCTPCT in the last 72 hours. Sepsis Labs: No results for input(s): PROCALCITON, LATICACIDVEN in the last 168 hours.  Recent Results (from the past 240 hour(s))  Resp Panel by RT-PCR (Flu A&B, Covid) Nasopharyngeal Swab     Status: None   Collection Time: 06/09/21 11:35 AM   Specimen: Nasopharyngeal Swab; Nasopharyngeal(NP) swabs in vial transport medium  Result Value Ref Range Status   SARS Coronavirus 2 by RT PCR NEGATIVE NEGATIVE Final    Comment: (NOTE) SARS-CoV-2 target nucleic acids are NOT DETECTED.  The SARS-CoV-2 RNA is generally detectable in upper respiratory specimens during the acute phase of infection. The lowest concentration of SARS-CoV-2 viral copies this assay can detect is 138 copies/mL. A negative result does not preclude SARS-Cov-2 infection and should not be used as the sole basis for treatment or other patient management decisions. A negative result may occur with  improper specimen collection/handling, submission of specimen other than nasopharyngeal swab, presence of viral mutation(s) within the areas targeted by this assay, and inadequate number of viral copies(<138 copies/mL). A negative result must be combined with clinical observations, patient history, and epidemiological information. The expected result is Negative.  Fact Sheet for Patients:  EntrepreneurPulse.com.au  Fact Sheet for Healthcare Providers:  IncredibleEmployment.be  This test is no t yet approved or cleared by the [place] FDA and  has been authorized for detection and/or diagnosis of SARS-CoV-2 by FDA under an  Emergency Use Authorization (EUA). This EUA will remain  in effect (meaning this test can be used) for the duration of the COVID-19 declaration under Section 564(b)(1) of the Act, 21 U.S.C.section 360bbb-3(b)(1), unless the authorization is terminated  or revoked sooner.       Influenza A by PCR NEGATIVE NEGATIVE Final   Influenza B by PCR NEGATIVE NEGATIVE Final    Comment: (NOTE) The Xpert Xpress SARS-CoV-2/FLU/RSV plus assay is intended as an aid in the diagnosis of influenza from Nasopharyngeal swab  specimens and should not be used as a sole basis for treatment. Nasal washings and aspirates are unacceptable for Xpert Xpress SARS-CoV-2/FLU/RSV testing.  Fact Sheet for Patients: EntrepreneurPulse.com.au  Fact Sheet for Healthcare Providers: IncredibleEmployment.be  This test is not yet approved or cleared by the [place] FDA and has been authorized for detection and/or diagnosis of SARS-CoV-2 by FDA under an Emergency Use Authorization (EUA). This EUA will remain in effect (meaning this test can be used) for the duration of the COVID-19 declaration under Section 564(b)(1) of the Act, 21 U.S.C. section 360bbb-3(b)(1), unless the authorization is terminated or revoked.  Performed at [facility], [place] [address] [place], [place] [address]   MRSA Next Gen by PCR, Nasal     Status: None   Collection Time: 06/09/21  1:54 PM   Specimen: Nasal Mucosa; Nasal Swab  Result Value Ref Range Status   MRSA by PCR Next Gen NOT DETECTED NOT DETECTED Final    Comment: (NOTE) The GeneXpert MRSA Assay (FDA approved for NASAL specimens only), is one component of a comprehensive MRSA colonization surveillance program. It is not intended to diagnose MRSA infection nor to guide or monitor treatment for MRSA infections. Test performance is not FDA approved in patients less than 22 years old. Performed at [facility], [place] [address],  [place], [place] [address]       Imaging Studies   No results found.   Medications   Scheduled Meds:  atorvastatin  40 mg Oral QHS   Chlorhexidine Gluconate Cloth  6 each Topical Daily   hydrALAZINE  25 mg Oral BID   QUEtiapine  25 mg Oral Q1200   QUEtiapine  50 mg Oral QHS   QUEtiapine  50 mg Oral q AM   rivaroxaban  20 mg Oral Q supper   Continuous Infusions:  sodium chloride     lactated ringers 75 mL/hr at 06/16/21 1400       LOS: 7 days    Time spent: 20 minutes      Ezekiel Slocumb, DO [facility]  06/16/2021, 2:33 PM      If 7PM-7AM, please contact night-coverage. How to contact the [facility] Attending or Consulting provider [place] or covering provider during after hours [place], for this patient?    Check the care team in [facility] and look for a) attending/consulting [facility] provider listed and b) the [facility] team listed Log into [email or web address] and use [place]'s universal password to access. If you do not have the password, please contact the hospital operator. Locate the [facility] provider you are looking for under [facility] and page to a number that you can be directly reached. If you still have difficulty reaching the provider, please page the [facility] (Director on Call) for the Hospitalists listed on amion for assistance.

## 2021-06-16 NOTE — Progress Notes (Addendum)
Physical Therapy Treatment Patient Details Name: [name] MRN: [id number] DOB: 05-Nov-1940 Today's Date: 06/16/2021    History of Present Illness This 80 y.o. female admitted with acute onset Rt sided weakness and aphasia.  CTA showed acute L M2 oacclusion > thrombectomy.  MRI showed:  Multifocal acute ischemia within the right internal capsule, left frontal white matter and left insula. 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  PMH includes:  cat bite with cellulitis Lt Lower leg, HTN, h/o CVAs, mild dementia    PT Comments    Pt seems more cognitively aware this session and was able to increase ambulation distance. She continues to be limited by decreased strength, activity tolerance, and balance and would benefit from SNF placement at d/c. Follow up recommendations and frequency updated as pt was denied CIR placement. Will continue to follow acutely.    Follow Up Recommendations  Supervision/Assistance - 24 hour;SNF     Equipment Recommendations  Rolling walker with 5" wheels;3in1 (PT);Wheelchair (measurements PT);Wheelchair cushion (measurements PT)    Recommendations for Other Services       Precautions / Restrictions Precautions Precautions: Fall Precaution Comments: posey, wrist restraints, watch BP    Mobility  Bed Mobility Overal bed mobility: Needs Assistance Bed Mobility: Sit to Supine;Supine to Sit     Supine to sit: Mod assist;HOB elevated;+2 for physical assistance Sit to supine: Mod assist;+2 for physical assistance   General bed mobility comments: Assist for trunk elevating out of bed. Assist at both trunk and LEs to progress back into bed.    Transfers Overall transfer level: Needs assistance Equipment used: Rolling walker (2 wheeled) Transfers: Sit to/from Stand Sit to Stand: +2 physical assistance;Min assist;Mod assist         General transfer comment: pt initially requiring mod A to power up from elevated surface, however after each  subsequent attempt pt improved, progressing to min A +1 by end of session. Pt does well with counting 1-2-3 to initiate standing.  Ambulation/Gait Ambulation/Gait assistance: +2 safety/equipment;Mod assist (chair to follow) Gait Distance (Feet): 70 Feet (35f, 363f Assistive device: Rolling walker (2 wheeled) Gait Pattern/deviations: Decreased stride length;Trunk flexed;Step-through pattern;Shuffle Gait velocity: decreased   General Gait Details: Pt with shuffling gait, decreased bilateral foot clearance, poor proximity to RW. Physicall assist required for stability. Chair to follow as pt fatigues quickly. Multimodal cues for RW proximity.   Stairs             Wheelchair Mobility    Modified Rankin (Stroke Patients Only) Modified Rankin (Stroke Patients Only) Pre-Morbid Rankin Score: No symptoms Modified Rankin: Moderately severe disability     Balance Overall balance assessment: Needs assistance Sitting-balance support: Feet supported Sitting balance-[name]: Poor Sitting balance - Comments: pt required up to mod A due to posterior lean Postural control: Posterior lean Standing balance support: Single extremity supported Standing balance-[name]: Poor Standing balance comment: requiring UE support and up to mod A                            Cognition Arousal/Alertness: Awake/alert Behavior During Therapy: Impulsive;Restless Overall Cognitive Status: Impaired/Different from baseline Area of Impairment: Orientation;Attention;Memory;Following commands;Safety/judgement;Awareness;Problem solving                 Orientation Level: Disoriented to;Place;Time;Situation Current Attention Level: Sustained Memory: Decreased short-term memory Following Commands: Follows one step commands consistently Safety/Judgement: Decreased awareness of safety;Decreased awareness of deficits Awareness: Intellectual Problem Solving: Decreased initiation;Requires  verbal cues;Difficulty sequencing;Requires tactile cues General Comments: Attending better today. Founding counting to 3 assists with initiating tasks.      Exercises      General Comments        Pertinent Vitals/Pain Pain Assessment: Faces Faces Pain Scale: Hurts little more Pain Location: L hand with gripping Pain Descriptors / Indicators: Cramping;Discomfort;Grimacing;Guarding Pain Intervention(s): Monitored during session;Limited activity within patient's tolerance    Home Living                      Prior Function            PT Goals (current goals can now be found in the care plan section) Acute Rehab PT Goals Patient Stated Goal: per family: for her to return to baseline PT Goal Formulation: Patient unable to participate in goal setting Time For Goal Achievement: 06/24/21 Potential to Achieve Goals: Good Progress towards PT goals: Progressing toward goals    Frequency    Min 3X/week      PT Plan Discharge plan needs to be updated;Frequency needs to be updated    Co-evaluation              AM-PAC PT "6 Clicks" Mobility   Outcome Measure  Help needed turning from your back to your side while in a flat bed without using bedrails?: A Little Help needed moving from lying on your back to sitting on the side of a flat bed without using bedrails?: A Little Help needed moving to and from a bed to a chair (including a wheelchair)?: A Lot Help needed standing up from a chair using your arms (e.g., wheelchair or bedside chair)?: A Lot Help needed to walk in hospital room?: A Lot Help needed climbing 3-5 steps with a railing? : A Lot 6 Click Score: 14    End of Session Equipment Utilized During Treatment: Gait belt Activity Tolerance: Patient tolerated treatment well Patient left: with call bell/phone within reach;with restraints reapplied;in bed;with bed alarm set (posy belt, in bed at RN request for pt safety.) Nurse Communication: Mobility  status PT Visit Diagnosis: Unsteadiness on feet (R26.81);Other abnormalities of gait and mobility (R26.89);Difficulty in walking, not elsewhere classified (R26.2);Other symptoms and signs involving the nervous system (R29.898)     Time: [id number] PT Time Calculation (min) (ACUTE ONLY): 26 min  Charges:  $Gait Training: 8-22 mins $Therapeutic Activity: 8-22 mins                     Benjiman Core, [place] Pager [phone] Acute Rehab   Allena Katz 06/16/2021, 4:14 PM

## 2021-06-16 NOTE — TOC Progression Note (Signed)
Transition of Care [facility]) - Progression Note    Patient Details  Name: [name] MRN: [id number] Date of Birth: Sep 08, 1941  Transition of Care [facility] CM/SW Contact  Coralee Pesa, [place] Phone Number: 06/16/2021, 2:14 PM  Clinical Narrative:    CSW followed up with pt's son and let him know that [employer] had declined at this time. He noted [facility] would be his second choice, they have not responded yet. He agreed to being faxed out further for more options. TOC will continue to follow for DC planning.   Expected Discharge Plan: [place] Barriers to Discharge: Continued Medical Work up, Ship broker  Expected Discharge Plan and Services Expected Discharge Plan: [place]   Discharge Planning Services: CM Consult   Living arrangements for the past 2 months: Single Family Home                                       Social Determinants of Health (SDOH) Interventions    Readmission Risk Interventions No flowsheet data found.

## 2021-06-16 NOTE — Plan of Care (Signed)
  Problem: Safety: Goal: Non-violent Restraint(s) Outcome: Progressing   Problem: Education: Goal: Knowledge of disease or condition will improve Outcome: Progressing Goal: Knowledge of secondary prevention will improve Outcome: Progressing Goal: Knowledge of patient specific risk factors addressed and post discharge goals established will improve Outcome: Progressing Goal: Individualized Educational Video(s) Outcome: Progressing   Problem: Coping: Goal: Will identify appropriate support needs Outcome: Progressing   Problem: Health Behavior/Discharge Planning: Goal: Ability to manage health-related needs will improve Outcome: Progressing   Problem: Self-Care: Goal: Ability to participate in self-care as condition permits will improve Outcome: Progressing Goal: Verbalization of feelings and concerns over difficulty with self-care will improve Outcome: Progressing Goal: Ability to communicate needs accurately will improve Outcome: Progressing   Problem: Nutrition: Goal: Risk of aspiration will decrease Outcome: Progressing Goal: Dietary intake will improve Outcome: Progressing   Problem: Ischemic Stroke/TIA Tissue Perfusion: Goal: Complications of ischemic stroke/TIA will be minimized Outcome: Progressing   Problem: Education: Goal: Knowledge of General Education information will improve Description: Including pain rating scale, medication(s)/side effects and non-pharmacologic comfort measures Outcome: Progressing   Problem: Health Behavior/Discharge Planning: Goal: Ability to manage health-related needs will improve Outcome: Progressing   Problem: Clinical Measurements: Goal: Ability to maintain clinical measurements within normal limits will improve Outcome: Progressing Goal: Will remain free from infection Outcome: Progressing Goal: Diagnostic test results will improve Outcome: Progressing Goal: Respiratory complications will improve Outcome:  Progressing Goal: Cardiovascular complication will be avoided Outcome: Progressing   Problem: Activity: Goal: Risk for activity intolerance will decrease Outcome: Progressing   Problem: Nutrition: Goal: Adequate nutrition will be maintained Outcome: Progressing   Problem: Coping: Goal: Level of anxiety will decrease Outcome: Progressing   Problem: Elimination: Goal: Will not experience complications related to bowel motility Outcome: Progressing Goal: Will not experience complications related to urinary retention Outcome: Progressing   Problem: Pain Managment: Goal: General experience of comfort will improve Outcome: Progressing   Problem: Safety: Goal: Ability to remain free from injury will improve Outcome: Progressing   Problem: Skin Integrity: Goal: Risk for impaired skin integrity will decrease Outcome: Progressing

## 2021-06-17 ENCOUNTER — Inpatient Hospital Stay (HOSPITAL_COMMUNITY): Payer: Medicare HMO

## 2021-06-17 DIAGNOSIS — F0391 Unspecified dementia with behavioral disturbance: Secondary | ICD-10-CM | POA: Diagnosis not present

## 2021-06-17 LAB — CBC
HCT: 35.9 % — ABNORMAL LOW (ref 36.0–46.0)
Hemoglobin: 11.7 g/dL — ABNORMAL LOW (ref 12.0–15.0)
MCH: 29.7 pg (ref 26.0–34.0)
MCHC: 32.6 g/dL (ref 30.0–36.0)
MCV: 91.1 fL (ref 80.0–100.0)
Platelets: 303 10*3/uL (ref 150–400)
RBC: 3.94 MIL/uL (ref 3.87–5.11)
RDW: 14.4 % (ref 11.5–15.5)
WBC: 6.7 10*3/uL (ref 4.0–10.5)
nRBC: 0 % (ref 0.0–0.2)

## 2021-06-17 IMAGING — DX DG CHEST 1V PORT
1 series · 1 of 1 positions shown · non-contrast
Comparison: Chest radiograph dated [DATE].

CLINICAL DATA: Fever, recent extubation.

EXAM:
PORTABLE CHEST 1 VIEW

[chest]
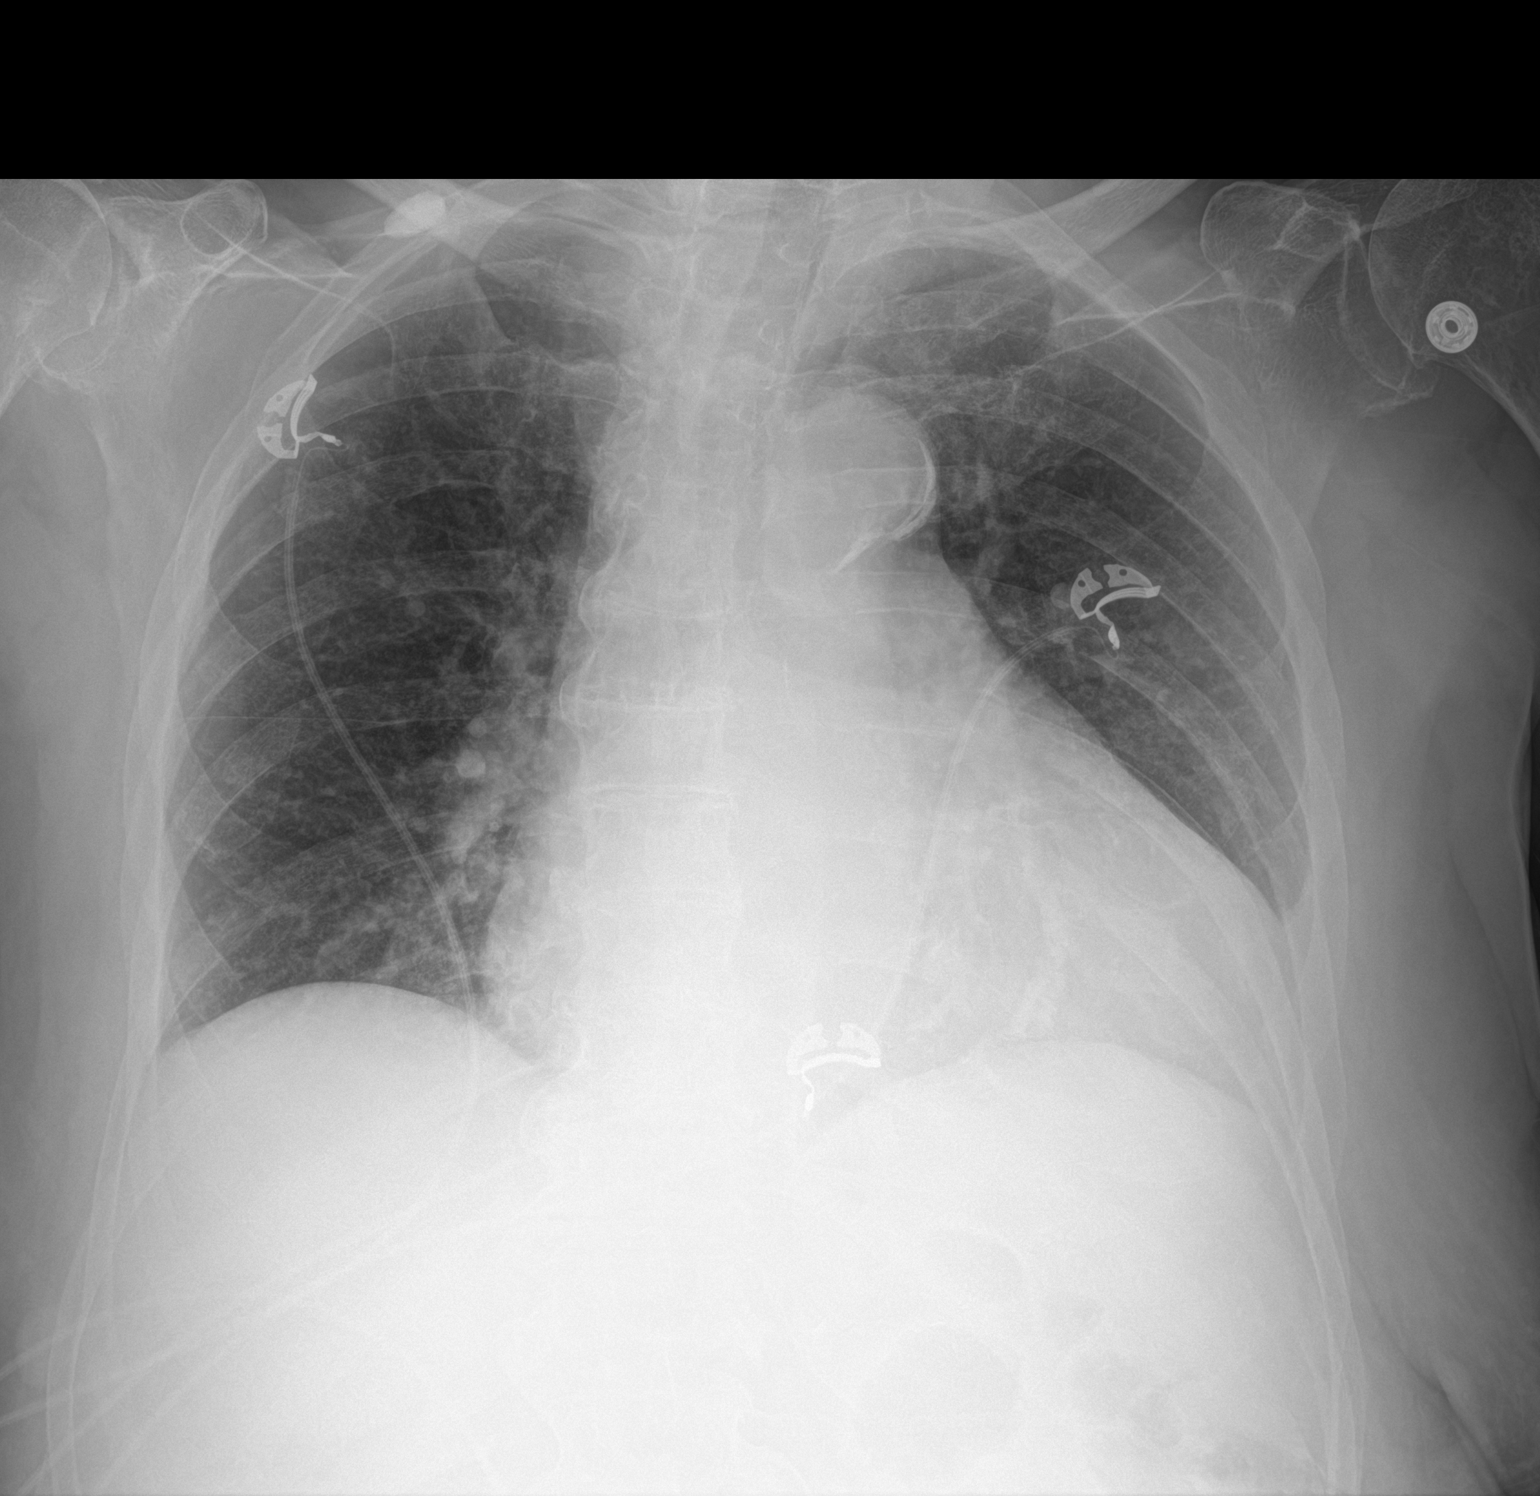

[1 of 1 positions shown; findings below may reference images not displayed]

FINDINGS: The heart is enlarged. Vascular calcifications are seen in the
aortic arch. The lungs are clear. There is no pleural effusion or
pneumothorax. Degenerative changes are seen in the spine.
IMPRESSION: No active pulmonary disease.  Cardiomegaly.

Aortic Atherosclerosis ([M7]-[M7]).

## 2021-06-17 NOTE — Plan of Care (Signed)
  Problem: Safety: Goal: Non-violent Restraint(s) Outcome: Progressing   Problem: Education: Goal: Knowledge of disease or condition will improve Outcome: Progressing Goal: Knowledge of secondary prevention will improve Outcome: Progressing Goal: Knowledge of patient specific risk factors addressed and post discharge goals established will improve Outcome: Progressing Goal: Individualized Educational Video(s) Outcome: Progressing   Problem: Coping: Goal: Will identify appropriate support needs Outcome: Progressing   Problem: Health Behavior/Discharge Planning: Goal: Ability to manage health-related needs will improve Outcome: Progressing   Problem: Self-Care: Goal: Ability to participate in self-care as condition permits will improve Outcome: Progressing Goal: Verbalization of feelings and concerns over difficulty with self-care will improve Outcome: Progressing Goal: Ability to communicate needs accurately will improve Outcome: Progressing   Problem: Nutrition: Goal: Risk of aspiration will decrease Outcome: Progressing Goal: Dietary intake will improve Outcome: Progressing   Problem: Ischemic Stroke/TIA Tissue Perfusion: Goal: Complications of ischemic stroke/TIA will be minimized Outcome: Progressing   Problem: Education: Goal: Knowledge of General Education information will improve Description: Including pain rating scale, medication(s)/side effects and non-pharmacologic comfort measures Outcome: Progressing   Problem: Health Behavior/Discharge Planning: Goal: Ability to manage health-related needs will improve Outcome: Progressing   Problem: Clinical Measurements: Goal: Ability to maintain clinical measurements within normal limits will improve Outcome: Progressing Goal: Will remain free from infection Outcome: Progressing Goal: Diagnostic test results will improve Outcome: Progressing Goal: Respiratory complications will improve Outcome:  Progressing Goal: Cardiovascular complication will be avoided Outcome: Progressing   Problem: Activity: Goal: Risk for activity intolerance will decrease Outcome: Progressing   Problem: Nutrition: Goal: Adequate nutrition will be maintained Outcome: Progressing   Problem: Coping: Goal: Level of anxiety will decrease Outcome: Progressing   Problem: Elimination: Goal: Will not experience complications related to bowel motility Outcome: Progressing Goal: Will not experience complications related to urinary retention Outcome: Progressing   Problem: Pain Managment: Goal: General experience of comfort will improve Outcome: Progressing   Problem: Safety: Goal: Ability to remain free from injury will improve Outcome: Progressing   Problem: Skin Integrity: Goal: Risk for impaired skin integrity will decrease Outcome: Progressing

## 2021-06-17 NOTE — Progress Notes (Addendum)
PROGRESS NOTE    [name]   [id number]  DOB: 01-Jul-1941  PCP: [name], DO    DOA: 06/09/2021 LOS: 8   Assessment & Plan   Active Problems:   Stroke ([place])   Acute ischemic left MCA stroke ([place])   Middle cerebral artery embolism, left   Agitation due to dementia ([place])   Acute left MCA stroke status post mechanical thrombectomy with TICI 3 -cardioembolic etiology due to being off Xarelto in the setting of hematuria Chronic A. Fib -resumed on Xarelto Hypertension Hypokalemia /hypomagnesemia Bradycardia -resolved CT head without acute findings. CTA head and neck showed left M2 occlusion MRI brain with right ICA, left insular cortex and left frontal infarcts Echo shows EF 65 to 70% LDL 38 A1c 5.3% --Stroke team following his primary --Continue Xarelto and lovastatin --Frequent reorientation and delirium precautions --Monitor and replace electrolytes --CIR recommended for rehab, awaiting authorization  Hemoglobin decline -possibly dilutional.  No signs of active bleeding. --Monitor CBC --Monitor for signs of bleeding with Xarelto --transfuse if hemoglobin less than 7  Acute hyperactive delirium versus dementia with sundowning --Started on nightly Seroquel, continue  Generalized weakness and immobility -  -- Insurance declined authorization for CIR.  -- SNF placement pending, TOC following --Pulmonary hygiene, OOB to chair, PT and OT  Hematuria -prior to admission and resolved.  Xarelto has been resumed 8/8, had been on hold per PCP until patient saw urology.  Seen by urology last week (PTA), given 3-day Cipro, told to resume Xarelto after Cipro course was finished.  History of stroke -in May of this year, left MCA scattered punctate infarcts.  MRA and carotid ultrasound negative.  EF was 60 to 65%, LDL 73, A1c was 5.5%.  Stroke etiology was deemed secondary to noncompliance with Xarelto.    History of NMS  - patient developed NMS after Haldol use during  admission in May, dantrolene was not required was monitored closely in ICU and treated with as needed Ativan. --Avoid Haldol --Monitor on Seroquel  A. Fib -rate controlled. --Resumed on Xarelto  Acute delirium with underlying dementia with behavioral disturbance-no prior formal diagnosis due to multiple cancellations and rescheduling of neurology appointments for evaluation of this. --Appointment pending in September --Baseline cognitive impairment in the home setting with sundowning, requires 24/7 supervision and assistance for bathing.  Able to eat and dress independently but slow and sometimes needs assistance. --Requiring Posey restraint due to agitation and combativeness and for safety --Continue Seroquel, dosing per psych --Monitor QTC --Psychiatry consulted  Hypertension -BP fluctuates.  Controlled on hydralazine 25 mg BID.  Hyperlipidemia -continue Lipitor 40 mg daily  Urinary retention Foley catheter -patient pulled out on 06/12/2021 8/12-14 requiring multiple in/out caths --Replace Foley --Monitor urine output closely Until Foley placed, continue --Bladder scans with in/out cath for retention > 300 cc   Patient BMI: Body mass index is 33.86 kg/m.   DVT prophylaxis: SCD's Start: 06/09/21 1143 rivaroxaban (XARELTO) tablet 20 mg   Diet:  Diet Orders (From admission, onward)     Start     Ordered   06/10/21 1135  Diet Heart Room service appropriate? Yes; Fluid consistency: Thin  Diet effective now       Question Answer Comment  Room service appropriate? Yes   Fluid consistency: Thin      06/10/21 1135              Code Status: Full Code   Brief Narrative / Hospital Course to Date:  80 year old female with past history of chronic A. fib on Xarelto which was recently held due to hematuria, dementia who presented to [facility] with acute onset aphasia and right-sided weakness.  Code stroke was called.  Noncontrast head CT was negative.  CTAs of head and neck showed  a left M2  occlusion.  Patient was not given tPA due to recent stroke and gross materia.  She underwent mechanical thrombectomy on 8/6 with TICI 3 revascularization.  Post procedure patient remained intubated in ICU and was admitted to [facility] service.  She was extubated on 8/7.  Required Precedex in the ICU for another 2 days, off since 8/9.  Respiratory status has been stable on room air since extubation.  TRH assumed care on 8/10.  Psychiatry is consulted and assisting with management of patient's behavioral disturbances and she has been started on Seroquel with dose adjustments being made per psych.  Subjective 06/17/21    Patient refused her AM oral meds today per RN.  Bedside RN also reports patient more easily agitated today, suspicious/paranoid.  Bladder not emptying and required multiple in/out caths so plan to place Foley.  No acute events reported.  Pt initially resistant to physical exam but became cooperative. She denies pain or other acute complaints.     Disposition Plan & Communication   Status is: Inpatient  Remains inpatient appropriate because:Inpatient level of care appropriate due to severity of illness, with stroke status post thrombectomy.  Requires SNF rehab on discharge, placement pending  Dispo: The patient is from: Home              Anticipated d/c is to: SNF              Patient currently is medically stable for discharge   Difficult to place patient No   Consults, Procedures, Significant Events   Consultants:  Neurology PCCM  Procedures:  Mechanical thrombectomy 8/6 Intubation 8/6 Extubation 8/7  Antimicrobials:  Anti-infectives (From admission, onward)    None         Micro    Objective   Vitals:   06/17/21 0249 06/17/21 0720 06/17/21 1111 06/17/21 1510  BP: (!) 144/79 (!) 148/73 (!) 146/87 119/83  Pulse: (!) 57 65 92 71  Resp: '18 18 18 18  '$ Temp: 97.8 F (36.6 C) 98.2 F (36.8 C) 99.4 F (37.4 C) 98 F (36.7 C)  TempSrc: Oral  Oral    SpO2: 98% 100%  95%  Weight:      Height:        Intake/Output Summary (Last 24 hours) at 06/17/2021 1832 Last data filed at 06/17/2021 0759 Gross per 24 hour  Intake 240 ml  Output --  Net 240 ml   Filed Weights   06/12/21 0743  Weight: 92.3 kg    Physical Exam:  General exam: awake and alert, no acute distress Respiratory system: CTAB, normal respiratory effort, on room air GI: soft and non-tender Cardiovascular system: RRR, no pedal edema.   Central nervous system: follows commands, normal speech Psychiatric: normal mood, more paranoid/suspicious and easily agitated today  Labs   Data Reviewed: I have personally reviewed following labs and imaging studies  CBC: Recent Labs  Lab 06/11/21 0846 06/12/21 0339 06/13/21 0350 06/14/21 0358 06/17/21 0907  WBC 8.3 7.0 7.9 7.4 6.7  NEUTROABS  --   --  5.5  --   --   HGB 12.5 11.5* 12.5 11.2* 11.7*  HCT 37.4 34.7* 37.9 34.8* 35.9*  MCV 90.3 90.6 91.1 92.3  91.1  PLT 319 289 308 278 [id number]   Basic Metabolic Panel: Recent Labs  Lab 06/11/21 0846 06/12/21 0339 06/13/21 0350 06/14/21 0358 06/15/21 1040  NA 142 139 139 140 139  K 3.3* 3.8 3.7 3.5 3.9  CL 109 110 105 105 107  CO2 '26 23 26 26 22  '$ GLUCOSE 81 99 111* 102* 84  BUN '13 15 17 12 16  '$ CREATININE 0.78 0.79 0.81 0.69 0.64  CALCIUM 8.7* 8.5* 8.6* 8.8* 8.8*  MG 1.8  --  1.8  --   --   PHOS 2.3*  --   --   --   --    GFR: Estimated Creatinine Clearance: 63 mL/min (by C-G formula based on SCr of 0.64 mg/dL). Liver Function Tests: Recent Labs  Lab 06/13/21 0350  AST 23  ALT 15  ALKPHOS 58  BILITOT 0.6  PROT 5.9*  ALBUMIN 3.0*   No results for input(s): LIPASE, AMYLASE in the last 168 hours. No results for input(s): AMMONIA in the last 168 hours. Coagulation Profile: No results for input(s): INR, PROTIME in the last 168 hours.  Cardiac Enzymes: No results for input(s): CKTOTAL, CKMB, CKMBINDEX, TROPONINI in the last 168 hours. BNP (last 3 results) No  results for input(s): PROBNP in the last 8760 hours. HbA1C: No results for input(s): HGBA1C in the last 72 hours. CBG: No results for input(s): GLUCAP in the last 168 hours.  Lipid Profile: No results for input(s): CHOL, HDL, LDLCALC, TRIG, CHOLHDL, LDLDIRECT in the last 72 hours. Thyroid Function Tests: No results for input(s): TSH, T4TOTAL, FREET4, T3FREE, THYROIDAB in the last 72 hours. Anemia Panel: No results for input(s): VITAMINB12, FOLATE, FERRITIN, TIBC, IRON, RETICCTPCT in the last 72 hours. Sepsis Labs: No results for input(s): PROCALCITON, LATICACIDVEN in the last 168 hours.  Recent Results (from the past 240 hour(s))  Resp Panel by RT-PCR (Flu A&B, Covid) Nasopharyngeal Swab     Status: None   Collection Time: 06/09/21 11:35 AM   Specimen: Nasopharyngeal Swab; Nasopharyngeal(NP) swabs in vial transport medium  Result Value Ref Range Status   SARS Coronavirus 2 by RT PCR NEGATIVE NEGATIVE Final    Comment: (NOTE) SARS-CoV-2 target nucleic acids are NOT DETECTED.  The SARS-CoV-2 RNA is generally detectable in upper respiratory specimens during the acute phase of infection. The lowest concentration of SARS-CoV-2 viral copies this assay can detect is 138 copies/mL. A negative result does not preclude SARS-Cov-2 infection and should not be used as the sole basis for treatment or other patient management decisions. A negative result may occur with  improper specimen collection/handling, submission of specimen other than nasopharyngeal swab, presence of viral mutation(s) within the areas targeted by this assay, and inadequate number of viral copies(<138 copies/mL). A negative result must be combined with clinical observations, patient history, and epidemiological information. The expected result is Negative.  Fact Sheet for Patients:  EntrepreneurPulse.com.au  Fact Sheet for Healthcare Providers:  IncredibleEmployment.be  This test  is no t yet approved or cleared by the [place] FDA and  has been authorized for detection and/or diagnosis of SARS-CoV-2 by FDA under an Emergency Use Authorization (EUA). This EUA will remain  in effect (meaning this test can be used) for the duration of the COVID-19 declaration under Section 564(b)(1) of the Act, 21 U.S.C.section 360bbb-3(b)(1), unless the authorization is terminated  or revoked sooner.       Influenza A by PCR NEGATIVE NEGATIVE Final   Influenza B by PCR NEGATIVE NEGATIVE Final  Comment: (NOTE) The Xpert Xpress SARS-CoV-2/FLU/RSV plus assay is intended as an aid in the diagnosis of influenza from Nasopharyngeal swab specimens and should not be used as a sole basis for treatment. Nasal washings and aspirates are unacceptable for Xpert Xpress SARS-CoV-2/FLU/RSV testing.  Fact Sheet for Patients: EntrepreneurPulse.com.au  Fact Sheet for Healthcare Providers: IncredibleEmployment.be  This test is not yet approved or cleared by the [place] FDA and has been authorized for detection and/or diagnosis of SARS-CoV-2 by FDA under an Emergency Use Authorization (EUA). This EUA will remain in effect (meaning this test can be used) for the duration of the COVID-19 declaration under Section 564(b)(1) of the Act, 21 U.S.C. section 360bbb-3(b)(1), unless the authorization is terminated or revoked.  Performed at [facility], [place] [address], [place], [place] [address]   MRSA Next Gen by PCR, Nasal     Status: None   Collection Time: 06/09/21  1:54 PM   Specimen: Nasal Mucosa; Nasal Swab  Result Value Ref Range Status   MRSA by PCR Next Gen NOT DETECTED NOT DETECTED Final    Comment: (NOTE) The GeneXpert MRSA Assay (FDA approved for NASAL specimens only), is one component of a comprehensive MRSA colonization surveillance program. It is not intended to diagnose MRSA infection nor to guide or monitor treatment for MRSA  infections. Test performance is not FDA approved in patients less than 48 years old. Performed at [facility], [place] [address], [place], [place] [address]       Imaging Studies   DG CHEST PORT 1 VIEW  Result Date: 06/17/2021 CLINICAL DATA:  Fever, recent extubation. EXAM: PORTABLE CHEST 1 VIEW COMPARISON:  Chest radiograph dated 06/09/2021. FINDINGS: The heart is enlarged. Vascular calcifications are seen in the aortic arch. The lungs are clear. There is no pleural effusion or pneumothorax. Degenerative changes are seen in the spine. IMPRESSION: No active pulmonary disease.  Cardiomegaly. Aortic Atherosclerosis (ICD10-I70.0). Electronically Signed   By: Zerita Boers M.D.   On: 06/17/2021 10:35     Medications   Scheduled Meds:  atorvastatin  40 mg Oral QHS   Chlorhexidine Gluconate Cloth  6 each Topical Daily   hydrALAZINE  25 mg Oral BID   QUEtiapine  25 mg Oral Q1200   QUEtiapine  50 mg Oral QHS   QUEtiapine  50 mg Oral q AM   rivaroxaban  20 mg Oral Q supper   tamsulosin  0.4 mg Oral QPC supper   Continuous Infusions:  sodium chloride         LOS: 8 days    Time spent: 25 minutes with > 50% spent at bedside and in coordination of care       Ezekiel Slocumb, DO [facility]  06/17/2021, 6:32 PM      If 7PM-7AM, please contact night-coverage. How to contact the [facility] Attending or Consulting provider [place] or covering provider during after hours [place], for this patient?    Check the care team in [facility] and look for a) attending/consulting [facility] provider listed and b) the [facility] team listed Log into [email or web address] and use [place]'s universal password to access. If you do not have the password, please contact the hospital operator. Locate the [facility] provider you are looking for under [facility] and page to a number that you can be directly reached. If you still have difficulty reaching the provider, please page the [facility] (Director on Call) for the  Hospitalists listed on amion for assistance.

## 2021-06-18 DIAGNOSIS — F0391 Unspecified dementia with behavioral disturbance: Secondary | ICD-10-CM | POA: Diagnosis not present

## 2021-06-18 NOTE — Progress Notes (Signed)
Physical Therapy Treatment Patient Details Name: [name] MRN: [id number] DOB: November 05, 1940 Today's Date: 06/18/2021    History of Present Illness This 80 y.o. female admitted with acute onset Rt sided weakness and aphasia.  CTA showed acute L M2 oacclusion > thrombectomy.  MRI showed:  Multifocal acute ischemia within the right internal capsule, left frontal white matter and left insula. 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  PMH includes:  cat bite with cellulitis Lt Lower leg, HTN, h/o CVAs, mild dementia    PT Comments    Upon arrival, pt in bed very restless. Posey belt and bilat mitts in place with pt attempting to scoot down in bed, her feet up on top of footboard. Posey belt released and multiple attempts made to redirect attention for participation in therapy. Pt very internally distracted, not following simple commands. Pt very active and wiggling around in bed but unable to get her to focus for purposeful mobility.  Bed placed in Trendelenburg to pull pt up in bed then returned to neutral with HOB elevated. Posey belt and mitts in place.   Follow Up Recommendations  Supervision/Assistance - 24 hour;SNF     Equipment Recommendations  Rolling walker with 5" wheels;3in1 (PT);Wheelchair (measurements PT);Wheelchair cushion (measurements PT)    Recommendations for Other Services       Precautions / Restrictions Precautions Precautions: Fall;Other (comment) Precaution Comments: posey belt, bilat mitts    Mobility  Bed Mobility Overal bed mobility: Needs Assistance             General bed mobility comments: Pt resisting all attempts at mobility, even rolling.    Transfers                    Ambulation/Gait                 Stairs             Wheelchair Mobility    Modified Rankin (Stroke Patients Only)       Balance                                            Cognition Arousal/Alertness:  Awake/alert Behavior During Therapy: Impulsive;Restless Overall Cognitive Status: Impaired/Different from baseline Area of Impairment: Orientation;Attention;Memory;Following commands;Safety/judgement;Awareness;Problem solving                 Orientation Level: Disoriented to;Place;Time;Situation Current Attention Level: Focused Memory: Decreased short-term memory Following Commands: Follows one step commands inconsistently Safety/Judgement: Decreased awareness of safety;Decreased awareness of deficits Awareness: Intellectual Problem Solving: Decreased initiation;Requires verbal cues;Difficulty sequencing;Requires tactile cues;Slow processing General Comments: unable to stay on task today, very nervous, not following commands, tangential speech      Exercises      General Comments        Pertinent Vitals/Pain Pain Assessment: Faces Faces Pain Scale: No hurt    Home Living                      Prior Function            PT Goals (current goals can now be found in the care plan section) Progress towards PT goals: Not progressing toward goals - comment (unable to fully participate today due to cognitive status)    Frequency    Min 3X/week      PT Plan Current plan  remains appropriate    Co-evaluation              AM-PAC PT "6 Clicks" Mobility   Outcome Measure  Help needed turning from your back to your side while in a flat bed without using bedrails?: A Little Help needed moving from lying on your back to sitting on the side of a flat bed without using bedrails?: A Lot Help needed moving to and from a bed to a chair (including a wheelchair)?: A Lot Help needed standing up from a chair using your arms (e.g., wheelchair or bedside chair)?: A Lot Help needed to walk in hospital room?: A Lot Help needed climbing 3-5 steps with a railing? : A Lot 6 Click Score: 13    End of Session   Activity Tolerance: Other (comment) (nervous, restless, not  following commands) Patient left: in bed;with call bell/phone within reach;with restraints reapplied;with bed alarm set   PT Visit Diagnosis: Unsteadiness on feet (R26.81);Other abnormalities of gait and mobility (R26.89);Difficulty in walking, not elsewhere classified (R26.2);Other symptoms and signs involving the nervous system (R29.898)     Time: [id number] PT Time Calculation (min) (ACUTE ONLY): 13 min  Charges:  $Therapeutic Activity: 8-22 mins                     Lorrin Goodell, PT  Office # [phone] Pager [phone]    Lorriane Shire 06/18/2021, 11:52 AM

## 2021-06-18 NOTE — Progress Notes (Signed)
PROGRESS NOTE    [name]   [id number]  DOB: [birth year]  PCP: [name], DO    DOA: 06/09/2021 LOS: 9   Assessment & Plan   Active Problems:   Stroke ([place])   Acute ischemic left MCA stroke ([place])   Middle cerebral artery embolism, left   Agitation due to dementia ([place])   Acute left MCA stroke status post mechanical thrombectomy with TICI 3 -cardioembolic etiology due to being off Xarelto in the setting of hematuria Hypertension Hypokalemia /hypomagnesemia Bradycardia -resolved CT head without acute findings. CTA head and neck showed left M2 occlusion MRI brain with right ICA, left insular cortex and left frontal infarcts Echo shows EF 65 to 70% LDL 38 A1c 5.3% --Stroke team following --Continue Xarelto and lovastatin --Frequent reorientation and delirium precautions --Monitor and replace electrolytes --CIR recommended for rehab, awaiting authorization  Hemoglobin decline -possibly dilutional.  No signs of active bleeding. --Monitor CBC --Monitor for signs of bleeding with Xarelto --transfuse if hemoglobin less than 7  Acute hyperactive delirium versus dementia with sundowning --Started on nightly Seroquel, continue  Generalized weakness and immobility -  -- Insurance declined authorization for CIR.  -- SNF placement pending, TOC following --Pulmonary hygiene, OOB to chair, PT and OT  Hematuria -prior to admission and resolved.  Xarelto has been resumed 8/8, had been on hold per PCP until patient saw urology.  Seen by urology last week (PTA), given 3-day Cipro, told to resume Xarelto after Cipro course was finished.  History of stroke -in May of this year, left MCA scattered punctate infarcts.  MRA and carotid ultrasound negative.  EF was 60 to 65%, LDL 73, A1c was 5.5%.  Stroke etiology was deemed secondary to noncompliance with Xarelto.    History of NMS  - patient developed NMS after Haldol use during admission in May, dantrolene was not required was  monitored closely in ICU and treated with as needed Ativan. --Avoid Haldol --Monitor on Seroquel  Chronic A. Fib -rate controlled. --Resumed on Xarelto  Acute delirium with underlying dementia with behavioral disturbance-no prior formal diagnosis due to multiple cancellations and rescheduling of neurology appointments for evaluation of this. --Appointment pending in September --Baseline cognitive impairment in the home setting with sundowning, requires 24/7 supervision and assistance for bathing.  Able to eat and dress independently but slow and sometimes needs assistance. --Requiring Posey restraint due to agitation and combativeness and for safety --Continue Seroquel, dosing per psych --Monitor QTC --Psychiatry consulted  Hypertension -BP fluctuates.  Controlled on hydralazine 25 mg BID.  Hyperlipidemia -continue Lipitor 40 mg daily  Urinary retention Foley catheter -patient pulled out on 06/12/2021 8/12-14 requiring multiple in/out caths --Replace Foley --Monitor urine output closely Until Foley placed, continue --Bladder scans with in/out cath for retention > 300 cc   Patient BMI: Body mass index is 33.86 kg/m.   DVT prophylaxis: SCD's Start: 06/09/21 1143 rivaroxaban (XARELTO) tablet 20 mg   Diet:  Diet Orders (From admission, onward)     Start     Ordered   06/10/21 1135  Diet Heart Room service appropriate? Yes; Fluid consistency: Thin  Diet effective now       Question Answer Comment  Room service appropriate? Yes   Fluid consistency: Thin      06/10/21 1135              Code Status: Full Code   Brief Narrative / Hospital Course to Date:    80 year old female with past history of chronic  A. fib on Xarelto which was recently held due to hematuria, dementia who presented to [facility] with acute onset aphasia and right-sided weakness.  Code stroke was called.  Noncontrast head CT was negative.  CTAs of head and neck showed a left M2  occlusion.  Patient was not  given tPA due to recent stroke and gross materia.  She underwent mechanical thrombectomy on 8/6 with TICI 3 revascularization.  Post procedure patient remained intubated in ICU and was admitted to [facility] service.  She was extubated on 8/7.  Required Precedex in the ICU for another 2 days, off since 8/9.  Respiratory status has been stable on room air since extubation.  TRH assumed care on 8/10.  Psychiatry is consulted and assisting with management of patient's behavioral disturbances and she has been started on Seroquel with dose adjustments being made per psych.  Subjective 06/18/21    Patient seen this morning at bedside, awake and alert.  She is restless and fidgeting as she often does.  She says I do not like this pointing to the belt around her waist.  She denies being in pain, having any trouble breathing or other complaints.  No acute events reported.   Disposition Plan & Communication   Status is: Inpatient  Remains inpatient appropriate because: Requires SNF rehab on discharge, placement pending.    Dispo: The patient is from: Home              Anticipated d/c is to: SNF              Medically stable for discharge? Yes   Difficult to place patient Yes   Consults, Procedures, Significant Events   Consultants:  Neurology PCCM  Procedures:  Mechanical thrombectomy 8/6 Intubation 8/6 Extubation 8/7  Antimicrobials:  Anti-infectives (From admission, onward)    None         Micro    Objective   Vitals:   06/18/21 0400 06/18/21 0856 06/18/21 1139 06/18/21 1532  BP: (!) 158/74 121/77 (!) 164/92 (!) 148/70  Pulse: 71 62 78 94  Resp: '18  19 18  '$ Temp: 97.6 F (36.4 C) 99 F (37.2 C) 98.2 F (36.8 C) 98.2 F (36.8 C)  TempSrc: Axillary Oral Oral Oral  SpO2: 96% 98% 98%   Weight:      Height:        Intake/Output Summary (Last 24 hours) at 06/18/2021 1657 Last data filed at 06/18/2021 1432 Gross per 24 hour  Intake 120 ml  Output 800 ml  Net -680 ml    Filed Weights   06/12/21 0743  Weight: 92.3 kg    Physical Exam:  General exam: Restless and fidgeting in bed, mittens and Posey belt in place, awake and alert, no acute distress Respiratory system: CTAB, normal respiratory effort, on room air Cardiovascular system: Irregular rhythm, regular rate, no pedal edema, systolic murmur.   Central nervous system: follows commands, normal speech Extremities: Mittens on hands, trace bilateral distal lower extremity edema Psychiatric: normal mood, restless but not agitated  Labs   Data Reviewed: I have personally reviewed following labs and imaging studies  CBC: Recent Labs  Lab 06/12/21 0339 06/13/21 0350 06/14/21 0358 06/17/21 0907  WBC 7.0 7.9 7.4 6.7  NEUTROABS  --  5.5  --   --   HGB 11.5* 12.5 11.2* 11.7*  HCT 34.7* 37.9 34.8* 35.9*  MCV 90.6 91.1 92.3 91.1  PLT 289 308 278 [id number]   Basic Metabolic Panel: Recent Labs  Lab 06/12/21  [id number] 06/13/21 0350 06/14/21 0358 06/15/21 1040  NA 139 139 140 139  K 3.8 3.7 3.5 3.9  CL 110 105 105 107  CO2 '23 26 26 22  '$ GLUCOSE 99 111* 102* 84  BUN '15 17 12 16  '$ CREATININE 0.79 0.81 0.69 0.64  CALCIUM 8.5* 8.6* 8.8* 8.8*  MG  --  1.8  --   --    GFR: Estimated Creatinine Clearance: 63 mL/min (by C-G formula based on SCr of 0.64 mg/dL). Liver Function Tests: Recent Labs  Lab 06/13/21 0350  AST 23  ALT 15  ALKPHOS 58  BILITOT 0.6  PROT 5.9*  ALBUMIN 3.0*   No results for input(s): LIPASE, AMYLASE in the last 168 hours. No results for input(s): AMMONIA in the last 168 hours. Coagulation Profile: No results for input(s): INR, PROTIME in the last 168 hours.  Cardiac Enzymes: No results for input(s): CKTOTAL, CKMB, CKMBINDEX, TROPONINI in the last 168 hours. BNP (last 3 results) No results for input(s): PROBNP in the last 8760 hours. HbA1C: No results for input(s): HGBA1C in the last 72 hours. CBG: No results for input(s): GLUCAP in the last 168 hours.  Lipid  Profile: No results for input(s): CHOL, HDL, LDLCALC, TRIG, CHOLHDL, LDLDIRECT in the last 72 hours. Thyroid Function Tests: No results for input(s): TSH, T4TOTAL, FREET4, T3FREE, THYROIDAB in the last 72 hours. Anemia Panel: No results for input(s): VITAMINB12, FOLATE, FERRITIN, TIBC, IRON, RETICCTPCT in the last 72 hours. Sepsis Labs: No results for input(s): PROCALCITON, LATICACIDVEN in the last 168 hours.  Recent Results (from the past 240 hour(s))  Resp Panel by RT-PCR (Flu A&B, Covid) Nasopharyngeal Swab     Status: None   Collection Time: 06/09/21 11:35 AM   Specimen: Nasopharyngeal Swab; Nasopharyngeal(NP) swabs in vial transport medium  Result Value Ref Range Status   SARS Coronavirus 2 by RT PCR NEGATIVE NEGATIVE Final    Comment: (NOTE) SARS-CoV-2 target nucleic acids are NOT DETECTED.  The SARS-CoV-2 RNA is generally detectable in upper respiratory specimens during the acute phase of infection. The lowest concentration of SARS-CoV-2 viral copies this assay can detect is 138 copies/mL. A negative result does not preclude SARS-Cov-2 infection and should not be used as the sole basis for treatment or other patient management decisions. A negative result may occur with  improper specimen collection/handling, submission of specimen other than nasopharyngeal swab, presence of viral mutation(s) within the areas targeted by this assay, and inadequate number of viral copies(<138 copies/mL). A negative result must be combined with clinical observations, patient history, and epidemiological information. The expected result is Negative.  Fact Sheet for Patients:  EntrepreneurPulse.com.au  Fact Sheet for Healthcare Providers:  IncredibleEmployment.be  This test is no t yet approved or cleared by the [place] FDA and  has been authorized for detection and/or diagnosis of SARS-CoV-2 by FDA under an Emergency Use Authorization (EUA). This EUA  will remain  in effect (meaning this test can be used) for the duration of the COVID-19 declaration under Section 564(b)(1) of the Act, 21 U.S.C.section 360bbb-3(b)(1), unless the authorization is terminated  or revoked sooner.       Influenza A by PCR NEGATIVE NEGATIVE Final   Influenza B by PCR NEGATIVE NEGATIVE Final    Comment: (NOTE) The Xpert Xpress SARS-CoV-2/FLU/RSV plus assay is intended as an aid in the diagnosis of influenza from Nasopharyngeal swab specimens and should not be used as a sole basis for treatment. Nasal washings and aspirates are unacceptable for Xpert Xpress  SARS-CoV-2/FLU/RSV testing.  Fact Sheet for Patients: EntrepreneurPulse.com.au  Fact Sheet for Healthcare Providers: IncredibleEmployment.be  This test is not yet approved or cleared by the [place] FDA and has been authorized for detection and/or diagnosis of SARS-CoV-2 by FDA under an Emergency Use Authorization (EUA). This EUA will remain in effect (meaning this test can be used) for the duration of the COVID-19 declaration under Section 564(b)(1) of the Act, 21 U.S.C. section 360bbb-3(b)(1), unless the authorization is terminated or revoked.  Performed at [facility], [place] [address] [place], [place] [address]   MRSA Next Gen by PCR, Nasal     Status: None   Collection Time: 06/09/21  1:54 PM   Specimen: Nasal Mucosa; Nasal Swab  Result Value Ref Range Status   MRSA by PCR Next Gen NOT DETECTED NOT DETECTED Final    Comment: (NOTE) The GeneXpert MRSA Assay (FDA approved for NASAL specimens only), is one component of a comprehensive MRSA colonization surveillance program. It is not intended to diagnose MRSA infection nor to guide or monitor treatment for MRSA infections. Test performance is not FDA approved in patients less than 47 years old. Performed at [facility], [place] [address] [place], [place] [address]   Culture, blood  (single)     Status: None (Preliminary result)   Collection Time: 06/17/21  9:00 AM   Specimen: BLOOD LEFT HAND  Result Value Ref Range Status   Specimen Description BLOOD LEFT HAND  Final   Special Requests   Final    BOTTLES DRAWN AEROBIC ONLY Blood Culture adequate volume   Culture   Final    NO GROWTH < 24 HOURS Performed at [facility], [place] [address], [place], [place] [address]    Report Status PENDING  Incomplete      Imaging Studies   DG CHEST PORT 1 VIEW  Result Date: 06/17/2021 CLINICAL DATA:  Fever, recent extubation. EXAM: PORTABLE CHEST 1 VIEW COMPARISON:  Chest radiograph dated 06/09/2021. FINDINGS: The heart is enlarged. Vascular calcifications are seen in the aortic arch. The lungs are clear. There is no pleural effusion or pneumothorax. Degenerative changes are seen in the spine. IMPRESSION: No active pulmonary disease.  Cardiomegaly. Aortic Atherosclerosis (ICD10-I70.0). Electronically Signed   By: Zerita Boers M.D.   On: 06/17/2021 10:35     Medications   Scheduled Meds:  atorvastatin  40 mg Oral QHS   Chlorhexidine Gluconate Cloth  6 each Topical Daily   hydrALAZINE  25 mg Oral BID   QUEtiapine  25 mg Oral Q1200   QUEtiapine  50 mg Oral QHS   QUEtiapine  50 mg Oral q AM   rivaroxaban  20 mg Oral Q supper   tamsulosin  0.4 mg Oral QPC supper   Continuous Infusions:  sodium chloride         LOS: 9 days    Time spent: 20 minutes       Ezekiel Slocumb, DO [facility]  06/18/2021, 4:57 PM      If 7PM-7AM, please contact night-coverage. How to contact the [facility] Attending or Consulting provider [place] or covering provider during after hours [place], for this patient?    Check the care team in [facility] and look for a) attending/consulting [facility] provider listed and b) the [facility] team listed Log into [email or web address] and use [place]'s universal password to access. If you do not have the password, please contact the hospital operator. Locate  the [facility] provider  you are looking for under [facility] and page to a number that you can be directly reached. If you still have difficulty reaching the provider, please page the [facility] (Director on Call) for the Hospitalists listed on amion for assistance.

## 2021-06-19 DIAGNOSIS — Z20822 Contact with and (suspected) exposure to covid-19: Secondary | ICD-10-CM | POA: Diagnosis not present

## 2021-06-19 DIAGNOSIS — I63412 Cerebral infarction due to embolism of left middle cerebral artery: Secondary | ICD-10-CM | POA: Diagnosis not present

## 2021-06-19 DIAGNOSIS — G210 Malignant neuroleptic syndrome: Secondary | ICD-10-CM | POA: Diagnosis not present

## 2021-06-19 DIAGNOSIS — F0391 Unspecified dementia with behavioral disturbance: Secondary | ICD-10-CM | POA: Diagnosis not present

## 2021-06-19 DIAGNOSIS — Z006 Encounter for examination for normal comparison and control in clinical research program: Secondary | ICD-10-CM | POA: Diagnosis not present

## 2021-06-19 LAB — COMPREHENSIVE METABOLIC PANEL
ALT: 13 U/L (ref 0–44)
AST: 21 U/L (ref 15–41)
Albumin: 2.6 g/dL — ABNORMAL LOW (ref 3.5–5.0)
Alkaline Phosphatase: 65 U/L (ref 38–126)
Anion gap: 6 (ref 5–15)
BUN: 13 mg/dL (ref 8–23)
CO2: 29 mmol/L (ref 22–32)
Calcium: 8.5 mg/dL — ABNORMAL LOW (ref 8.9–10.3)
Chloride: 103 mmol/L (ref 98–111)
Creatinine, Ser: 0.79 mg/dL (ref 0.44–1.00)
GFR, Estimated: >60 mL/min (ref >60)
Glucose, Bld: 111 mg/dL — ABNORMAL HIGH (ref 70–99)
Potassium: 3.9 mmol/L (ref 3.5–5.1)
Sodium: 138 mmol/L (ref 135–145)
Total Bilirubin: 1.1 mg/dL (ref 0.3–1.2)
Total Protein: 6 g/dL — ABNORMAL LOW (ref 6.5–8.1)

## 2021-06-19 LAB — CK TOTAL AND CKMB (NOT AT ARMC)
CK, MB: 1.3 ng/mL (ref 0.5–5.0)
Relative Index: INVALID (ref 0.0–2.5)
Total CK: 43 U/L (ref 38–234)

## 2021-06-19 LAB — CBC WITH DIFFERENTIAL/PLATELET
Abs Immature Granulocytes: 0.03 10*3/uL (ref 0.00–0.07)
Basophils Absolute: 0 10*3/uL (ref 0.0–0.1)
Basophils Relative: 0 %
Eosinophils Absolute: 0.1 10*3/uL (ref 0.0–0.5)
Eosinophils Relative: 2 %
HCT: 35 % — ABNORMAL LOW (ref 36.0–46.0)
Hemoglobin: 11.7 g/dL — ABNORMAL LOW (ref 12.0–15.0)
Immature Granulocytes: 0 %
Lymphocytes Relative: 14 %
Lymphs Abs: 1.1 10*3/uL (ref 0.7–4.0)
MCH: 29.8 pg (ref 26.0–34.0)
MCHC: 33.4 g/dL (ref 30.0–36.0)
MCV: 89.3 fL (ref 80.0–100.0)
Monocytes Absolute: 0.7 10*3/uL (ref 0.1–1.0)
Monocytes Relative: 8 %
Neutro Abs: 6.3 10*3/uL (ref 1.7–7.7)
Neutrophils Relative %: 76 %
Platelets: 408 10*3/uL — ABNORMAL HIGH (ref 150–400)
RBC: 3.92 MIL/uL (ref 3.87–5.11)
RDW: 14 % (ref 11.5–15.5)
WBC: 8.2 10*3/uL (ref 4.0–10.5)
nRBC: 0 % (ref 0.0–0.2)

## 2021-06-19 MED ORDER — DIVALPROEX SODIUM 125 MG PO CSDR
125.0000 mg | DELAYED_RELEASE_CAPSULE | Freq: Two times a day (BID) | ORAL | Status: DC
  Administered 2021-06-19 – 2021-06-27 (×17): 125 mg via ORAL
  Filled 2021-06-19 (×17): qty 1

## 2021-06-19 MED ORDER — QUETIAPINE FUMARATE 50 MG PO TABS
50.0000 mg | ORAL_TABLET | Freq: Three times a day (TID) | ORAL | Status: DC
  Administered 2021-06-19 – 2021-07-04 (×45): 50 mg via ORAL
  Filled 2021-06-19 (×45): qty 1

## 2021-06-19 MED ORDER — QUETIAPINE FUMARATE 25 MG PO TABS: 25.0000 mg | ORAL_TABLET | Freq: Two times a day (BID) | ORAL | Status: DC

## 2021-06-19 NOTE — Consult Note (Signed)
  80 y.o. female admitted with acute onset Rt sided weakness and aphasia.  CTA showed acute L M2 oacclusion > thrombectomy.  MRI showed:  Multifocal acute ischemia within the right internal capsule, left frontal white matter and left insula. 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  PMH includes:  cat bite with cellulitis Lt Lower leg, HTN, h/o CVAs, mild dementia  Psych consult placed for worsening behaviors and difficulty to place in rehab. Patient continues to have symptoms consistent with delirium vs post stroke mood disturbance, in the setting of dementia. She does not appear to be responding to current psychotropic medication. There is NO  nursing documentation to describe her behaviors in the past 7 days.   Careful management of pharmacological therapy, space-time reorientation, early mobilization, minimization of restraint use, and adequate sleep hygiene is the most recommended option for preventing delirium in hospitalized patients. Ideally using these techniques, managing comfort, promoting a relaxing environment and assessing for discomfort are optimal ways to control aggression in a demented patient. It is with hopes due to her extended length of stay that she will be placed soon in order to reduce and minimize use of multiple pharmacologic agents and restraints.   Suspect Post Stroke Emotional Disturbance: -Will increase Seroquel '50mg'$  po TID.  Although it is clear she does have underlying conditions of neurocognitive disorder on top of stroke, the benefit of using antipsychotics outweighs the risk of increased mortality.   -Will add Depakote for mood dysregulation. Depakote level ordered for Friday 06/22/2021.  - We will recheck LFT to rule out hepatotoxicity.   -Will check EKG to rule out QTc prolongation.  . -Recommend 1:1 Air cabin crew. It is important we make our best attempt to reduce restraints in order to reduce her level of agitation and aggression.  Safety sitters offer  supportive interaction with patient, and can easily identify patient needs, reorient, and address environmental factors that contribute to the delirium.    -patient at high risk for developing NMS/EPS like symptoms. Will obtain cbc and ck at this time.  New orders placed.    -Will continue to monitor to this patient under psychiatric services, although these behaviors are expected.

## 2021-06-19 NOTE — Progress Notes (Signed)
Occupational Therapy Treatment Patient Details Name: LEZLY TAWNEY MRN: [id number] DOB: May 13, 1941 Today's Date: 06/19/2021    History of present illness 80 y.o. female admitted with acute onset Rt sided weakness and aphasia.  CTA showed acute L M2 oacclusion > thrombectomy.  MRI showed:  Multifocal acute ischemia within the right internal capsule, left frontal white matter and left insula. 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  PMH includes:  cat bite with cellulitis Lt Lower leg, HTN, h/o CVAs, mild dementia   OT comments  Pt demonstrating increased engagement and participation this session. Upon arrival, pt supine in bed having just finished bath with NT. Pt performing functional mobility in her room with Min Guard-Min A and RW. Pt performing grooming at sink with Min Guard A for balance with Min-Max cues for sequencing and terminating. Ending session in chair with alarm on and waist restraint in place; RN aware. Continue to recommend dc to post-acute rehab and will continue to follow acutely as admitted.   Follow Up Recommendations  CIR    Equipment Recommendations  None recommended by OT    Recommendations for Other Services Rehab consult    Precautions / Restrictions Precautions Precautions: Fall;Other (comment) Precaution Comments: posey belt       Mobility Bed Mobility Overal bed mobility: Needs Assistance Bed Mobility: Supine to Sit     Supine to sit: Min assist;HOB elevated     General bed mobility comments: Min A for trunk support    Transfers Overall transfer level: Needs assistance Equipment used: Rolling walker (2 wheeled) Transfers: Sit to/from Stand Sit to Stand: Min assist         General transfer comment: Min A for initiating power up and gaining balance in standing    Balance Overall balance assessment: Needs assistance Sitting-balance support: Feet supported;No upper extremity supported Sitting balance-[name]: Fair     Standing  balance support: No upper extremity supported;During functional activity Standing balance-[name]: Fair Standing balance comment: Maintaining statuc standing at sink with MIn Guard A for safety                           ADL either performed or assessed with clinical judgement   ADL Overall ADL's : Needs assistance/impaired     Grooming: Brushing hair;Oral care;Min guard;Cueing for sequencing;Maximal assistance;Standing Grooming Details (indicate cue type and reason): Pt performing grooming tasks at sink with Min Guard A for standing balance. Pt brushing her hair with Min cues for intiation and then Max cues to terminate task. Pt requiring Max cues for sequencing oral care; once started and brushing, pt able to maintain automatic and habitual sequencing. Cues for terminating oral care                 Toilet Transfer: Minimal assistance;Ambulation;RW (simulated to recliner) Toilet Transfer Details (indicate cue type and reason): Pt requires max cues for attention and problem solving         Functional mobility during ADLs: Minimal assistance;Rolling walker General ADL Comments: Pt performing grooming at sink adn then functional mobility to recliner. Demonstrating increased activity tolerance.     Vision       Perception     Praxis      Cognition Arousal/Alertness: Awake/alert Behavior During Therapy: Impulsive;Restless Overall Cognitive Status: Impaired/Different from baseline Area of Impairment: Orientation;Attention;Memory;Following commands;Safety/judgement;Awareness;Problem solving                 Orientation Level: Disoriented to;Place;Time;Situation  Current Attention Level: Focused Memory: Decreased short-term memory Following Commands: Follows one step commands inconsistently Safety/Judgement: Decreased awareness of safety;Decreased awareness of deficits Awareness: Intellectual Problem Solving: Decreased initiation;Requires verbal  cues;Difficulty sequencing;Requires tactile cues;Slow processing General Comments: Pt agreeable to therapy        Exercises     Shoulder Instructions       General Comments      Pertinent Vitals/ Pain       Pain Assessment: Faces Faces Pain Scale: No hurt Pain Location: L hand with gripping Pain Descriptors / Indicators: Cramping;Discomfort;Grimacing;Guarding Pain Intervention(s): Monitored during session;Limited activity within patient's tolerance;Repositioned  Home Living                                          Prior Functioning/Environment              Frequency  Min 2X/week        Progress Toward Goals  OT Goals(current goals can now be found in the care plan section)  Progress towards OT goals: Progressing toward goals  Acute Rehab OT Goals Patient Stated Goal: per family: for her to return to baseline OT Goal Formulation: Patient unable to participate in goal setting Time For Goal Achievement: 06/24/21 Potential to Achieve Goals: Good ADL Goals Pt Will Perform Eating: with set-up;with supervision;sitting Pt Will Perform Grooming: with min guard assist;standing Pt Will Perform Upper Body Bathing: with min assist;sitting Pt Will Perform Lower Body Bathing: with min assist;sit to/from stand Pt Will Perform Upper Body Dressing: with min assist;sitting Pt Will Perform Lower Body Dressing: with min assist;sit to/from stand Pt Will Transfer to Toilet: with min guard assist;ambulating;regular height toilet;bedside commode;grab bars Pt Will Perform Toileting - Clothing Manipulation and hygiene: with min guard assist;sit to/from stand  Plan Discharge plan remains appropriate    Co-evaluation    PT/OT/SLP Co-Evaluation/Treatment: Yes Reason for Co-Treatment: For patient/therapist safety;To address functional/ADL transfers   OT goals addressed during session: ADL's and self-care      AM-PAC OT "6 Clicks" Daily Activity     Outcome  Measure   Help from another person eating meals?: A Lot Help from another person taking care of personal grooming?: A Lot Help from another person toileting, which includes using toliet, bedpan, or urinal?: A Lot Help from another person bathing (including washing, rinsing, drying)?: A Lot Help from another person to put on and taking off regular upper body clothing?: A Lot Help from another person to put on and taking off regular lower body clothing?: A Lot 6 Click Score: 12    End of Session Equipment Utilized During Treatment: Gait belt;Rolling walker  OT Visit Diagnosis: Unsteadiness on feet (R26.81);Cognitive communication deficit (R41.841) Symptoms and signs involving cognitive functions: Cerebral infarction   Activity Tolerance Patient tolerated treatment well   Patient Left in chair;with call bell/phone within reach;with chair alarm set;with restraints reapplied   Nurse Communication Mobility status        Time: [id number] OT Time Calculation (min): 30 min  Charges: OT General Charges $OT Visit: 1 Visit OT Treatments $Self Care/Home Management : 23-37 mins  Brady, OTR/L Acute Rehab Pager: [phone] Office: [place] 06/19/2021, 1:12 PM

## 2021-06-19 NOTE — TOC Progression Note (Signed)
Transition of Care [facility]) - Progression Note    Patient Details  Name: [name] MRN: [id number] Date of Birth: [birth year]  Transition of Care [facility]) CM/SW [place], [place] Phone Number: 06/19/2021, 3:39 PM  Clinical Narrative:   CSW spoke with patient's daughter in law, [name], to discuss barriers to SNF placement, including that the patient continues to be in restraints, agitated, and not following commands. [name] indicated understanding and appreciated the update, asked what could be done. CSW to send a message to MD about what can be done to help with getting the patient out of restraints. Patient has no bed offers at this time due to restraints. CSW also discussed with [name] concerns that the patient may end up long term care if she isn't able to improve in rehab. CSW sent a message to MD about barrier to SNF placement being restraints and agitation, and MD placed a psych consult. CSW to continue to follow.    Expected Discharge Plan: Skilled Nursing Facility Barriers to Discharge: Continued Medical Work up, [place] will not accept until restraint criteria met  Expected Discharge Plan and Services Expected Discharge Plan: [place]   Discharge Planning Services: CM Consult   Living arrangements for the past 2 months: Single Family Home                                       Social Determinants of Health (SDOH) Interventions    Readmission Risk Interventions No flowsheet data found.

## 2021-06-19 NOTE — Progress Notes (Signed)
PROGRESS NOTE    [name]   [id number]  DOB: 09-28-1941  PCP: Lisa Roys, DO    DOA: 06/09/2021 LOS: 10   Assessment & Plan   Active Problems:   Stroke ([place])   Acute ischemic left MCA stroke ([place])   Middle cerebral artery embolism, left   Agitation due to dementia ([place])   Acute left MCA stroke status post mechanical thrombectomy with TICI 3 -cardioembolic etiology due to being off Xarelto in the setting of hematuria Hypertension Hypokalemia /hypomagnesemia Bradycardia -resolved CT head without acute findings. CTA head and neck showed left M2 occlusion MRI brain with right ICA, left insular cortex and left frontal infarcts Echo shows EF 65 to 70% LDL 38 A1c 5.3% --Stroke team following --Continue Xarelto and lovastatin --Frequent reorientation and delirium precautions --Monitor and replace electrolytes --CIR recommended for rehab, awaiting authorization  Hemoglobin decline -possibly dilutional.  No signs of active bleeding. --Monitor CBC --Monitor for signs of bleeding with Xarelto --transfuse if hemoglobin less than 7  Acute hyperactive delirium versus dementia with sundowning --Started on nightly Seroquel, continue  Generalized weakness and immobility -  -- Insurance declined authorization for CIR.  -- SNF placement pending, TOC following --Pulmonary hygiene, OOB to chair, PT and OT  Hematuria -prior to admission and resolved.  Xarelto has been resumed 8/8, had been on hold per PCP until patient saw urology.  Seen by urology last week (PTA), given 3-day Cipro.  History of stroke -in May of this year, left MCA scattered punctate infarcts.  MRA and carotid ultrasound negative.  EF was 60 to 65%, LDL 73, A1c was 5.5%.  Stroke etiology was deemed secondary to noncompliance with Xarelto.    History of NMS  - patient developed NMS after Haldol use during admission in May, dantrolene was not required was monitored closely in ICU and treated with as needed  Ativan. --Avoid Haldol --Monitor on Seroquel  Chronic A. Fib -rate controlled. --Resumed on Xarelto  Acute delirium with underlying dementia with behavioral disturbance-no prior formal diagnosis due to multiple cancellations and rescheduling of neurology appointments for evaluation of this. --Appointment pending in September --Baseline cognitive impairment in the home setting with sundowning, requires 24/7 supervision and assistance for bathing.  Able to eat and dress independently but slow and sometimes needs assistance. --Psychiatry consulted --Requiring Posey restraint due to agitation and combativeness and for safety --Continue Seroquel, dosing per psych, increased to TID today --Depakote added today --Monitor QTC   Hypertension -BP fluctuates.  Controlled on hydralazine 25 mg BID.  Hyperlipidemia -continue Lipitor 40 mg daily  Urinary retention Foley catheter -patient pulled out on 06/12/2021 8/12-14 requiring multiple in/out caths --Replace Foley --Monitor urine output closely --Re-attempt voiding trial  --after Foley out continue bladder scans and in/out cath for retention > 300 cc   Patient BMI: Body mass index is 33.86 kg/m.   DVT prophylaxis: SCD's Start: 06/09/21 1143 rivaroxaban (XARELTO) tablet 20 mg   Diet:  Diet Orders (From admission, onward)     Start     Ordered   06/10/21 1135  Diet Heart Room service appropriate? Yes; Fluid consistency: Thin  Diet effective now       Question Answer Comment  Room service appropriate? Yes   Fluid consistency: Thin      06/10/21 1135              Code Status: Full Code   Brief Narrative / Hospital Course to Date:    80 year old female with past  history of chronic A. fib on Xarelto which was recently held due to hematuria, dementia who presented to [facility] with acute onset aphasia and right-sided weakness.  Code stroke was called.  Noncontrast head CT was negative.  CTAs of head and neck showed a left M2  occlusion.   Patient was not given tPA due to recent stroke and gross materia.  She underwent mechanical thrombectomy on 8/6 with TICI 3 revascularization.  Post procedure patient remained intubated in ICU and was admitted to [facility] service.  She was extubated on 8/7.  Required Precedex in the ICU for another 2 days, off since 8/9.  Respiratory status has been stable on room air since extubation.  TR[facility] assumed care on 8/10.  Psychiatry is consulted and assisting with management of patient's behavioral disturbances and she has been started on Seroquel with dose adjustments being made per psych.  Subjective 06/19/21    Patient has had ongoing agitation and restlessness, constantly requires posey belt for safety.  She says she feels fine. Denies any pain, trouble breathing or anything bother her except she points to the belt on her waist.   Disposition Plan & Communication   Status is: Inpatient  Remains inpatient appropriate because: Requires SNF rehab on discharge, placement pending.    Dispo: The patient is from: Home              Anticipated d/c is to: SNF              Medically stable for discharge? Yes   Difficult to place patient Yes   Consults, Procedures, Significant Events   Consultants:  Neurology PCCM  Procedures:  Mechanical thrombectomy 8/6 Intubation 8/6 Extubation 8/7  Antimicrobials:  Anti-infectives (From admission, onward)    None         Micro    Objective   Vitals:   06/19/21 0428 06/19/21 0831 06/19/21 1226 06/19/21 1722  BP: 131/85 (!) 160/91 (!) 140/94 (!) 148/77  Pulse: 66 83 78 79  Resp: '18 17 18 20  '$ Temp: 98 F (36.7 C) 98 F (36.7 C) 99.8 F (37.7 C) 98.8 F (37.1 C)  TempSrc: Axillary Oral Oral Oral  SpO2: 98% 97% 98% 97%  Weight:      Height:        Intake/Output Summary (Last 24 hours) at 06/19/2021 1946 Last data filed at 06/19/2021 1737 Gross per 24 hour  Intake 60 ml  Output 1025 ml  Net -965 ml   Filed Weights   06/12/21 0743   Weight: 92.3 kg    Physical Exam:  General exam: sitting up in bed, more calm right now, Posey belt in place, awake and alert, no acute distress Respiratory system: CTAB, normal respiratory effort, on room air Cardiovascular system: Irregular rhythm, regular rate, no pedal edema, systolic murmur.   Central nervous system: follows commands, normal speech Psychiatric: normal mood, calmer demeanor today  Labs   Data Reviewed: I have personally reviewed following labs and imaging studies  CBC: Recent Labs  Lab 06/13/21 0350 06/14/21 0358 06/17/21 0907 06/19/21 1631  WBC 7.9 7.4 6.7 8.2  NEUTROABS 5.5  --   --  6.3  HGB 12.5 11.2* 11.7* 11.7*  HCT 37.9 34.8* 35.9* 35.0*  MCV 91.1 92.3 91.1 89.3  PLT 308 278 303 [id number]*   Basic Metabolic Panel: Recent Labs  Lab 06/13/21 0350 06/14/21 0358 06/15/21 1040 06/19/21 1631  NA 139 140 139 138  K 3.7 3.5 3.9 3.9  CL 105 105 107  103  CO2 '26 26 22 29  '$ GLUCOSE 111* 102* 84 111*  BUN '17 12 16 13  '$ CREATININE 0.81 0.69 0.64 0.79  CALCIUM 8.6* 8.8* 8.8* 8.5*  MG 1.8  --   --   --    GFR: Estimated Creatinine Clearance: 63 mL/min (by C-G formula based on SCr of 0.79 mg/dL). Liver Function Tests: Recent Labs  Lab 06/13/21 0350 06/19/21 1631  AST 23 21  ALT 15 13  ALKPHOS 58 65  BILITOT 0.6 1.1  PROT 5.9* 6.0*  ALBUMIN 3.0* 2.6*   No results for input(s): LIPASE, AMYLASE in the last 168 hours. No results for input(s): AMMONIA in the last 168 hours. Coagulation Profile: No results for input(s): INR, PROTIME in the last 168 hours.  Cardiac Enzymes: Recent Labs  Lab 06/19/21 1631  CKTOTAL 43  CKMB 1.3   BNP (last 3 results) No results for input(s): PROBNP in the last 8760 hours. HbA1C: No results for input(s): HGBA1C in the last 72 hours. CBG: No results for input(s): GLUCAP in the last 168 hours.  Lipid Profile: No results for input(s): CHOL, HDL, LDLCALC, TRIG, CHOLHDL, LDLDIRECT in the last 72 hours. Thyroid  Function Tests: No results for input(s): TSH, T4TOTAL, FREET4, T3FREE, THYROIDAB in the last 72 hours. Anemia Panel: No results for input(s): VITAMINB12, FOLATE, FERRITIN, TIBC, IRON, RETICCTPCT in the last 72 hours. Sepsis Labs: No results for input(s): PROCALCITON, LATICACIDVEN in the last 168 hours.  Recent Results (from the past 240 hour(s))  Culture, blood (single)     Status: None (Preliminary result)   Collection Time: 06/17/21  9:00 AM   Specimen: BLOOD LEFT HAND  Result Value Ref Range Status   Specimen Description BLOOD LEFT HAND  Final   Special Requests   Final    BOTTLES DRAWN AEROBIC ONLY Blood Culture adequate volume   Culture   Final    NO GROWTH 2 DAYS Performed at [facility], [address], [place], [place] [address]    Report Status PENDING  Incomplete      Imaging Studies   No results found.   Medications   Scheduled Meds:  atorvastatin  40 mg Oral QHS   Chlorhexidine Gluconate Cloth  6 each Topical Daily   divalproex  125 mg Oral Q12H   hydrALAZINE  25 mg Oral BID   QUEtiapine  50 mg Oral TID   rivaroxaban  20 mg Oral Q supper   tamsulosin  0.4 mg Oral QPC supper   Continuous Infusions:  sodium chloride         LOS: 10 days    Time spent: 30 minutes with > 50% spent at bedside and in coordination of care        Ezekiel Slocumb, DO [facility]  06/19/2021, 7:46 PM      If 7PM-7AM, please contact night-coverage. How to contact the [facility] Attending or Consulting provider [place] or covering provider during after hours [place], for this patient?    Check the care team in [facility] and look for a) attending/consulting [facility] provider listed and b) the [facility] team listed Log into [email or web address] and use [place]'s universal password to access. If you do not have the password, please contact the hospital operator. Locate the [facility] provider you are looking for under [facility] and page to a number that you can be directly  reached. If you still have difficulty reaching the provider, please page the [facility] (Director on Call) for  the Hospitalists listed on amion for assistance.

## 2021-06-19 NOTE — Care Management Important Message (Signed)
Important Message  Patient Details  Name: [name] MRN: [id number] Date of Birth: [birth year]   Medicare Important Message Given:  Yes  Signed on 06/18/2021   Orbie Pyo 06/19/2021, 11:23 AM

## 2021-06-20 DIAGNOSIS — F0391 Unspecified dementia with behavioral disturbance: Secondary | ICD-10-CM | POA: Diagnosis not present

## 2021-06-20 NOTE — Consult Note (Signed)
Psychiatry Consultation Progress Note  Subjective: [name] is an 80 year old female admitted for code stroke and re-consulted for medication management of agitation in the setting of dementia. On interview, she is alert, pleasant, and cooperative. She says that her mood is "good," and she has been eating and sleeping well. She is only oriented to person, saying that she is currently in [place], at home with her parents who are 85 and 72. She laughed and smiled before stating the ages of her parents.    Treatment Plan:  No agitation is observed on exam; patient was started on Seroquel 50 mg TID and Depakote sprinkles 125 mg BID, which she appears to be tolerating well. Qtc 470. At this time, no changes to medications will be made.    Patient is determined to be psychiatrically stable at this time. Psychiatry will sign off. Please do not hesitate to call back if questions arise. Thank you for this consult.     Rosezetta Schlatter, MD PGY-1 06/20/2021 [place] Department of Psychiatry

## 2021-06-20 NOTE — Progress Notes (Signed)
  Speech Language Pathology Treatment:    Patient Details Name: [name] MRN: [id number] DOB: 1940/11/30 Today's Date: 06/20/2021 Time: [id number] SLP Time Calculation (min) (ACUTE ONLY): 20 min  Assessment / Plan / Recommendation Clinical Impression  Pt seen for aphasia tx with mod verbal cues provided  for sentence completion, semantic/phonemic cues for naming tasks with 50% achieved with divergent naming task; confrontational naming with 70% accuracy with perseveration/repetition observed.  Word fluency task improved from prior session with pt generating 8 objects with category cues provided (mod).  Oriented to self/place only, but pt's overall mentation improved as she was less restless/confused this session.  Pt able to follow 2-step directives with min verbal cues and 80% accuracy obtained for simple functional tasks.  SImple memory tasks attempted with LTM improved for personal information as pt was able to name son's names this session, but STM impaired at baseline and persisting during acute stay.  ST will continue to f/u while in acute setting for aphasia/cognition.  HPI HPI: [name] is a 80 y.o. female with PMH significant for A. fib, recently taken off Xarelto for gross hematuria, hypertension, dementia (sundowning and requiring 24 hour supervision), multiple prior ischemic stroke with most recent on 5/26 who presented to [facility] emergency department with acute onset aphasia and right-sided weakness. CTA was obtained with left MCA M2 occlusion with 51 cc penumbra. Patient was transferred to [facility] for thrombectomy.  MRI of the brain was showing multifocal acute ischemia within the right internal capsule, left frontal white matter and left insula as well as old left occipital and frontal infarcts.  She was intubated briefly.  Per RN family reported onset of cognitive issues in May.  She was scheduled to see a neurologist.      [place] with current plan of  care       Recommendations   Reg/thin                Follow up Recommendations: Other (comment) (TBD) SLP Visit Diagnosis: Aphasia (R47.01);Cognitive communication deficit (R41.841) Plan: Continue with current plan of care                       Elvina Sidle, M.S., CCC-SLP 06/20/2021, 11:14 AM

## 2021-06-20 NOTE — Progress Notes (Signed)
Physical Therapy Treatment Patient Details Name: [name] MRN: [id number] DOB: [birth year] Today's Date: 06/20/2021    History of Present Illness 80 y.o. female admitted with acute onset Rt sided weakness and aphasia.  CTA showed acute L M2 oacclusion > thrombectomy.  MRI showed:  Multifocal acute ischemia within the right internal capsule, left frontal white matter and left insula. 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  PMH includes:  cat bite with cellulitis Lt Lower leg, HTN, h/o CVAs, mild dementia    PT Comments    Patient continues to be oriented only to self. Patient requires minA for bed mobility and sit to stand transfer with RW. Patient ambulated 66' with RW and modA for balance and RW management. Patient requires frequent cues to attend to task due to impaired cognition. Continue to recommend SNF for ongoing Physical Therapy.       Follow Up Recommendations  Supervision/Assistance - 24 hour;SNF     Equipment Recommendations  Rolling [name] with 5" wheels;3in1 (PT);Wheelchair (measurements PT);Wheelchair cushion (measurements PT)    Recommendations for Other Services       Precautions / Restrictions Precautions Precautions: Fall;Other (comment) Precaution Comments: posey belt Restrictions Weight Bearing Restrictions: No    Mobility  Bed Mobility Overal bed mobility: Needs Assistance Bed Mobility: Supine to Sit;Sit to Supine     Supine to sit: Min assist;HOB elevated Sit to supine: Min assist   General bed mobility comments: minA for trunk elevation and returning LEs into bed    Transfers Overall transfer level: Needs assistance Equipment used: Rolling [name] (2 wheeled) Transfers: Sit to/from Stand Sit to Stand: Min assist         General transfer comment: minA to rise and steady. Cues for hand placement and waiting for therapist to be ready  Ambulation/Gait Ambulation/Gait assistance: Mod assist Gait Distance (Feet): 30 Feet Assistive  device: Rolling [name] (2 wheeled) Gait Pattern/deviations: Decreased stride length;Trunk flexed;Step-through pattern;Shuffle Gait velocity: decreased   General Gait Details: Shuffling gait with poor Rw management requiring cues and assistance to maintain close proximity. Assist for balance and patient losing balance laterally to L   Stairs             Wheelchair Mobility    Modified Rankin (Stroke Patients Only) Modified Rankin (Stroke Patients Only) Pre-Morbid Rankin Score: No symptoms Modified Rankin: Moderately severe disability     Balance Overall balance assessment: Needs assistance Sitting-balance support: Feet supported;No upper extremity supported Sitting balance-[name]: Fair     Standing balance support: No upper extremity supported;During functional activity Standing balance-[name]: Fair Standing balance comment: able to statically stand at EOB with min guard                            Cognition Arousal/Alertness: Awake/alert Behavior During Therapy: Impulsive;Restless Overall Cognitive Status: Impaired/Different from baseline Area of Impairment: Orientation;Attention;Memory;Following commands;Safety/judgement;Awareness;Problem solving                 Orientation Level: Disoriented to;Place;Time;Situation Current Attention Level: Focused Memory: Decreased short-term memory Following Commands: Follows one step commands inconsistently Safety/Judgement: Decreased awareness of safety;Decreased awareness of deficits Awareness: Intellectual Problem Solving: Decreased initiation;Requires verbal cues;Difficulty sequencing;Requires tactile cues;Slow processing General Comments: agreeable to therapy but impulsive and following commands inconsistently.      Exercises General Exercises - Lower Extremity Long Arc Quad: Both;10 reps;Seated Hip Flexion/Marching: Both;5 reps;Seated    General Comments        Pertinent  Vitals/Pain  Pain Assessment: Faces Faces Pain Scale: No hurt Pain Intervention(s): Monitored during session    Home Living                      Prior Function            PT Goals (current goals can now be found in the care plan section) Acute Rehab PT Goals Patient Stated Goal: per family: for her to return to baseline PT Goal Formulation: Patient unable to participate in goal setting Time For Goal Achievement: 06/24/21 Potential to Achieve Goals: Good Progress towards PT goals: Progressing toward goals    Frequency    Min 3X/week      PT Plan Current plan remains appropriate    Co-evaluation              AM-PAC PT "6 Clicks" Mobility   Outcome Measure  Help needed turning from your back to your side while in a flat bed without using bedrails?: A Little Help needed moving from lying on your back to sitting on the side of a flat bed without using bedrails?: A Little Help needed moving to and from a bed to a chair (including a wheelchair)?: A Little Help needed standing up from a chair using your arms (e.g., wheelchair or bedside chair)?: A Little Help needed to walk in hospital room?: A Lot Help needed climbing 3-5 steps with a railing? : A Lot 6 Click Score: 16    End of Session Equipment Utilized During Treatment: Gait belt Activity Tolerance: Patient tolerated treatment well Patient left: in bed;with call bell/phone within reach;with restraints reapplied;with bed alarm set Nurse Communication: Mobility status PT Visit Diagnosis: Unsteadiness on feet (R26.81);Other abnormalities of gait and mobility (R26.89);Difficulty in walking, not elsewhere classified (R26.2);Other symptoms and signs involving the nervous system (R29.898)     Time: [id number] PT Time Calculation (min) (ACUTE ONLY): 20 min  Charges:  $Gait Training: 8-22 mins                     [name] A. Gilford Rile PT, DPT Acute Rehabilitation Services Pager [phone] Office [phone]    [name] 06/20/2021, 3:08 PM

## 2021-06-20 NOTE — Progress Notes (Signed)
PROGRESS NOTE  [name]  [id number] DOB: 05/25/1941 DOA: 06/09/2021 PCP: [name], DO   Brief Narrative: 80 year old female with past history of chronic A. fib on Xarelto which was recently held due to hematuria, dementia who presented to [facility] with acute onset aphasia and right-sided weakness.  Code stroke was called.  Noncontrast head CT was negative.  CTAs of head and neck showed a left M2  occlusion.  Patient was not given tPA due to recent stroke and gross materia.  She underwent mechanical thrombectomy on 8/6 with TICI 3 revascularization.  Post procedure patient remained intubated in ICU and was admitted to [facility] service.  She was extubated on 8/7.  Required Precedex in the ICU for another 2 days, off since 8/9.  Respiratory status has been stable on room air since extubation.   TRH assumed care on 8/10.  Psychiatry is consulted and assisting with management of patient's behavioral disturbances and she has been started on Seroquel with dose adjustments being made per psych.  Assessment & Plan: Active Problems:   Stroke [facility])   Acute ischemic left MCA stroke (HCC)   Middle cerebral artery embolism, left   Agitation due to dementia [facility])  Acute left MCA stroke status post mechanical thrombectomy with TICI 3 -cardioembolic etiology due to being off Xarelto in the setting of hematuria Hypertension Hypokalemia /hypomagnesemia Bradycardia -resolved CT head without acute findings. CTA head and neck showed left M2 occlusion MRI brain with right ICA, left insular cortex and left frontal infarcts Echo shows EF 65 to 70% LDL 38 A1c 5.3% --Stroke team following --Continue Xarelto and lovastatin --Frequent reorientation and delirium precautions --Monitor and replace electrolytes - Needs rehabilitation at SNF vs. CIR.   Generalized weakness and immobility -  -- Insurance declined authorization for CIR.  -- SNF placement pending, TOC following --Pulmonary hygiene, OOB to chair,  PT and OT   Hematuria -prior to admission and resolved.  Xarelto has been resumed 8/8, had been on hold per PCP until patient saw urology.  Seen by urology last week (PTA), given 3-day Cipro. - Recheck as outpatient.   History of stroke -in May of this year, left MCA scattered punctate infarcts.  MRA and carotid ultrasound negative.  EF was 60 to 65%, LDL 73, A1c was 5.5%.  Stroke etiology was deemed secondary to noncompliance with Xarelto.     History of NMS  - patient developed NMS after Haldol use during admission in May, dantrolene was not required was monitored closely in ICU. - Use as needed Ativan. - Avoid haldol, using caution with seroquel   Chronic A. Fib -rate controlled. - Continue xarelto   Acute delirium with underlying dementia with behavioral disturbance-no prior formal diagnosis due to multiple cancellations and rescheduling of neurology appointments for evaluation of this. --Appointment pending in September --Baseline cognitive impairment in the home setting with sundowning, requires 24/7 supervision and assistance for bathing.  Able to eat and dress independently but slow and sometimes needs assistance. - Renew posey belt restraint to be used if needed. - Continue seroquel, depakote per psychiatry - Monitor QTc intermittently. Will DC continuous telemetry as this causes significant agitation and is not reliably on the patient anyway.   Hypertension:  - Continue hydralazine 25 mg BID.   Hyperlipidemia:  - Continue atorvastatin '40mg'$    Urinary retention: Most recently placed foley was 8/14.  - Plan voiding trial soon.   Obesity: Estimated body mass index is 33.86 kg/m as calculated from the following:  Height as of this encounter: '5\' 5"'$  (1.651 m).   Weight as of this encounter: 92.3 kg.  DVT prophylaxis: Xarelto Code Status: Full Family Communication: None at bedside Disposition Plan:  Status is: Inpatient  Remains inpatient appropriate because:Altered mental  status and Unsafe d/c plan  Dispo: The patient is from: Home              Anticipated d/c is to: SNF vs. CIR. Treating agitation aggressively which is primary barrier at this time.              Patient currently is medically stable to d/c.   Difficult to place patient Yes  Subjective: Confused but interactive. Denies pain. Has been very agitated at times in the past few hours per RN.  Objective: Vitals:   06/20/21 0407 06/20/21 0844 06/20/21 1247 06/20/21 1634  BP: (!) 105/54 124/78 115/66 128/60  Pulse: 62 (!) 59 64 60  Resp: '20 18 20 20  '$ Temp: 98 F (36.7 C) 97.7 F (36.5 C) 98.1 F (36.7 C) 98 F (36.7 C)  TempSrc:  Oral Oral Oral  SpO2: 95%  98% 96%  Weight:      Height:        Intake/Output Summary (Last 24 hours) at 06/20/2021 1650 Last data filed at 06/20/2021 1512 Gross per 24 hour  Intake --  Output 1850 ml  Net -1850 ml   Filed Weights   06/12/21 0743  Weight: 92.3 kg    Gen: Elderly female in no distress Pulm: Non-labored breathing room air. Clear to auscultation bilaterally.  CV: Regular rate and rhythm. No murmur, rub, or gallop. No JVD, no pitting pedal edema. GI: Abdomen soft, non-tender, non-distended, with normoactive bowel sounds. No organomegaly or masses felt. Loose lap belt in place. Ext: Warm, no deformities Skin: No acute rashes, lesions or ulcers Neuro: Alert and oriented only to person. Not cooperative with exam.  Psych: UTD  Data Reviewed: I have personally reviewed following labs and imaging studies  CBC: Recent Labs  Lab 06/14/21 0358 06/17/21 0907 06/19/21 1631  WBC 7.4 6.7 8.2  NEUTROABS  --   --  6.3  HGB 11.2* 11.7* 11.7*  HCT 34.8* 35.9* 35.0*  MCV 92.3 91.1 89.3  PLT 278 303 [id number]*   Basic Metabolic Panel: Recent Labs  Lab 06/14/21 0358 06/15/21 1040 06/19/21 1631  NA 140 139 138  K 3.5 3.9 3.9  CL 105 107 103  CO2 '26 22 29  '$ GLUCOSE 102* 84 111*  BUN '12 16 13  '$ CREATININE 0.69 0.64 0.79  CALCIUM 8.8* 8.8* 8.5*    GFR: Estimated Creatinine Clearance: 63 mL/min (by C-G formula based on SCr of 0.79 mg/dL). Liver Function Tests: Recent Labs  Lab 06/19/21 1631  AST 21  ALT 13  ALKPHOS 65  BILITOT 1.1  PROT 6.0*  ALBUMIN 2.6*   No results for input(s): LIPASE, AMYLASE in the last 168 hours. No results for input(s): AMMONIA in the last 168 hours. Coagulation Profile: No results for input(s): INR, PROTIME in the last 168 hours. Cardiac Enzymes: Recent Labs  Lab 06/19/21 1631  CKTOTAL 43  CKMB 1.3   BNP (last 3 results) No results for input(s): PROBNP in the last 8760 hours. HbA1C: No results for input(s): HGBA1C in the last 72 hours. CBG: No results for input(s): GLUCAP in the last 168 hours. Lipid Profile: No results for input(s): CHOL, HDL, LDLCALC, TRIG, CHOLHDL, LDLDIRECT in the last 72 hours. Thyroid Function Tests: No results for input(s):  TSH, T4TOTAL, FREET4, T3FREE, THYROIDAB in the last 72 hours. Anemia Panel: No results for input(s): VITAMINB12, FOLATE, FERRITIN, TIBC, IRON, RETICCTPCT in the last 72 hours. Urine analysis:    Component Value Date/Time   COLORURINE AMBER (A) 03/31/2021 1702   APPEARANCEUR Clear 06/06/2021 1130   LABSPEC 1.040 (H) 03/31/2021 1702   PHURINE 5.0 03/31/2021 1702   GLUCOSEU Negative 06/06/2021 1130   HGBUR SMALL (A) 03/31/2021 1702   BILIRUBINUR Negative 06/06/2021 1130   KETONESUR 5 (A) 03/31/2021 1702   PROTEINUR Negative 06/06/2021 1130   PROTEINUR 100 (A) 03/31/2021 1702   NITRITE Negative 06/06/2021 1130   NITRITE NEGATIVE 03/31/2021 1702   LEUKOCYTESUR 1+ (A) 06/06/2021 1130   LEUKOCYTESUR SMALL (A) 03/31/2021 1702   Recent Results (from the past 240 hour(s))  Culture, blood (single)     Status: None (Preliminary result)   Collection Time: 06/17/21  9:00 AM   Specimen: BLOOD LEFT HAND  Result Value Ref Range Status   Specimen Description BLOOD LEFT HAND  Final   Special Requests   Final    BOTTLES DRAWN AEROBIC ONLY Blood  Culture adequate volume   Culture   Final    NO GROWTH 3 DAYS Performed at [facility], [address], [place], [place] [address]    Report Status PENDING  Incomplete      Radiology Studies: No results found.  Scheduled Meds:  atorvastatin  40 mg Oral QHS   Chlorhexidine Gluconate Cloth  6 each Topical Daily   divalproex  125 mg Oral Q12H   hydrALAZINE  25 mg Oral BID   QUEtiapine  50 mg Oral TID   rivaroxaban  20 mg Oral Q supper   tamsulosin  0.4 mg Oral QPC supper   Continuous Infusions:  sodium chloride       LOS: 11 days   Time spent: 25 minutes.  Patrecia Pour, MD [facility] [email or web address] 06/20/2021, 4:50 PM

## 2021-06-21 DIAGNOSIS — F0391 Unspecified dementia with behavioral disturbance: Secondary | ICD-10-CM | POA: Diagnosis not present

## 2021-06-21 NOTE — Progress Notes (Signed)
Occupational Therapy Treatment Patient Details Name: [name] MRN: [id number] DOB: 1940-12-31 Today's Date: 06/21/2021    History of present illness 80 y.o. female admitted with acute onset Rt sided weakness and aphasia.  CTA showed acute L M2 oacclusion > thrombectomy.  MRI showed:  Multifocal acute ischemia within the right internal capsule, left frontal white matter and left insula. 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  PMH includes:  cat bite with cellulitis Lt Lower leg, HTN, h/o CVAs, mild dementia   OT comments  Pt progressing towards established OT goals. Continue to present with decreased cognition, balance, and safety. Pt performing functional mobility with Min A and RW. Pt completing oral care with Min guard A while standing at sink. Pt with bowel urgency and requiring Max cues to safely walking to toilet and performing toileting. Min A for toilet transfer and Max A for peri care. Update dc recommendation for SNF to increase duration of rehab and will continue to follow acutely as admitted.   Follow Up Recommendations  SNF    Equipment Recommendations  None recommended by OT    Recommendations for Other Services Rehab consult    Precautions / Restrictions Precautions Precautions: Fall;Other (comment) Precaution Comments: posey belt       Mobility Bed Mobility Overal bed mobility: Needs Assistance Bed Mobility: Supine to Sit     Supine to sit: Min assist;HOB elevated     General bed mobility comments: minA for trunk elevation and returning LEs into bed    Transfers Overall transfer level: Needs assistance Equipment used: Rolling walker (2 wheeled) Transfers: Sit to/from Stand Sit to Stand: Min assist         General transfer comment: Min A for safe descent due to urgency. Min A for initating power up    Balance Overall balance assessment: Needs assistance Sitting-balance support: Feet supported;No upper extremity supported Sitting  balance-[name]: Fair     Standing balance support: No upper extremity supported;During functional activity Standing balance-[name]: Fair                             ADL either performed or assessed with clinical judgement   ADL Overall ADL's : Needs assistance/impaired     Grooming: Oral care;Min guard;Standing Grooming Details (indicate cue type and reason): Pt performing oral care at sink with Min Guard A for safety. No cues for initating                 Toilet Transfer: Minimal assistance;Ambulation;Regular Toilet;Grab bars;RW Armed forces technical officer Details (indicate cue type and reason): Pt requires max cues for attention and problem solving Toileting- Clothing Manipulation and Hygiene: Maximal assistance;Sit to/from stand       Functional mobility during ADLs: Minimal assistance;Rolling walker;Cueing for sequencing;Cueing for safety General ADL Comments: pt presenting with poor balance, cognition, and safety     Vision       Perception     Praxis      Cognition Arousal/Alertness: Awake/alert Behavior During Therapy: Impulsive;Restless Overall Cognitive Status: Impaired/Different from baseline Area of Impairment: Orientation;Attention;Memory;Following commands;Safety/judgement;Awareness;Problem solving                 Orientation Level: Disoriented to;Place;Time;Situation Current Attention Level: Focused Memory: Decreased short-term memory Following Commands: Follows one step commands inconsistently Safety/Judgement: Decreased awareness of safety;Decreased awareness of deficits Awareness: Intellectual Problem Solving: Decreased initiation;Requires verbal cues;Difficulty sequencing;Requires tactile cues;Slow processing General Comments: More conversation. Becoming impulsive and rushed  with bowel urgency.        Exercises     Shoulder Instructions       General Comments BM during session; notified RN    Pertinent Vitals/  Pain       Pain Assessment: Faces Faces Pain Scale: No hurt Pain Location: L knee Pain Descriptors / Indicators: Discomfort;Grimacing Pain Intervention(s): Monitored during session;Limited activity within patient's tolerance;Repositioned  Home Living                                          Prior Functioning/Environment              Frequency  Min 2X/week        Progress Toward Goals  OT Goals(current goals can now be found in the care plan section)  Progress towards OT goals: Progressing toward goals  Acute Rehab OT Goals Patient Stated Goal: per family: for her to return to baseline OT Goal Formulation: Patient unable to participate in goal setting Time For Goal Achievement: 06/24/21 Potential to Achieve Goals: Good ADL Goals Pt Will Perform Eating: with set-up;with supervision;sitting Pt Will Perform Grooming: with min guard assist;standing Pt Will Perform Upper Body Bathing: with min assist;sitting Pt Will Perform Lower Body Bathing: with min assist;sit to/from stand Pt Will Perform Upper Body Dressing: with min assist;sitting Pt Will Perform Lower Body Dressing: with min assist;sit to/from stand Pt Will Transfer to Toilet: with min guard assist;ambulating;regular height toilet;bedside commode;grab bars Pt Will Perform Toileting - Clothing Manipulation and hygiene: with min guard assist;sit to/from stand  Plan Discharge plan remains appropriate    Co-evaluation    PT/OT/SLP Co-Evaluation/Treatment: Yes Reason for Co-Treatment: For patient/therapist safety;To address functional/ADL transfers   OT goals addressed during session: ADL's and self-care      AM-PAC OT "6 Clicks" Daily Activity     Outcome Measure   Help from another person eating meals?: A Lot Help from another person taking care of personal grooming?: A Lot Help from another person toileting, which includes using toliet, bedpan, or urinal?: A Lot Help from another person  bathing (including washing, rinsing, drying)?: A Lot Help from another person to put on and taking off regular upper body clothing?: A Lot Help from another person to put on and taking off regular lower body clothing?: A Lot 6 Click Score: 12    End of Session Equipment Utilized During Treatment: Gait belt;Rolling walker  OT Visit Diagnosis: Unsteadiness on feet (R26.81);Cognitive communication deficit (R41.841) Symptoms and signs involving cognitive functions: Cerebral infarction   Activity Tolerance Patient tolerated treatment well   Patient Left in chair;with call bell/phone within reach;with chair alarm set;with restraints reapplied   Nurse Communication Mobility status        Time: 0920-0950 OT Time Calculation (min): 30 min  Charges: OT General Charges $OT Visit: 1 Visit OT Treatments $Self Care/Home Management : 23-37 mins  [place], OTR/L Acute Rehab Pager: [phone] Office: [place] 06/21/2021, 10:06 AM

## 2021-06-21 NOTE — Progress Notes (Signed)
PROGRESS NOTE    [name]  [id number] DOB: 1941/04/28 DOA: 06/09/2021 PCP: [name], DO   Chief Complain: Aphasia, right-sided weakness  Brief Narrative: Patient is a 80 year old female with history of chronic A. fib on Xarelto, dementia who presented to [facility] with acute onset of aphasia, right-sided weakness.  Code stroke was called.  Head CT did not show any changes.  CTA head and neck showed left M2 occlusion.  She underwent mechanical thrombectomy on 8/6 with TICI 3 for revascularization.Postprocedure, patient remained intubated in ICU and was admitted under PCCM service.  She was extubated on 8/7, required Precedex drip which was discontinued on 8/9.  Currently respiratory status is stable.  Psychiatry was also consulted for behavioral disturbances and was started on Seroquel.  Psychiatry has signed off.  Patient is being planned for skilled nursing facility placement but not possible currently because of continued agitation/behavioral disturbance/need for restraints.  Assessment & Plan:   Active Problems:   Stroke [facility])   Acute ischemic left MCA stroke (HCC)   Middle cerebral artery embolism, left   Agitation due to dementia [facility])   Acute left MCA stroke: Status post mechanical thrombectomy with TICI 3.  Stroke thought to be cardioembolic in etiology due to being off on Xarelto in the setting of hematuria.  Neurology was following.  CT head did not show any acute findings.  CTA head and neck showed left M2 occlusion.  MRI of the brain showed right ICA/left insular cortex/left frontal infarcts. Stroke work-up completed.  Echo showed EF of 65 to 70%.  LDL 38.  Hemoglobin A1c 5.3. Currently on Xarelto, atorvastatin. PT recommended skilled nursing facility on discharge.  Behavioral disturbance/agitation/acute delirium/suspected underlying dementia: No formal diagnosis of dementia made due to multiple cancellation, rescheduling of neurology appointments.  She has baseline  cognitive impairment with history of sundowning, requires 24/7 supervision/assistance for ADLs.  Able to eat and dress independently but slow.  She was persistently agitated, has behavioral disturbances here.  Psychiatry was consulted.  Started on Seroquel, Depakote. She is a still agitated, incoherent.  On restraints. She has history of neuroleptic malignant syndrome after Haldol use on admission in May.  Avoid Haldol, use Ativan as needed for severe agitation only. We will try to get her off restrains today and see  Chronic A. fib: Currently rate is controlled.  On Xarelto for anticoagulation.  Hypertension: Currently on hydralazine.  Hyperlipidemia: On Lipitor 40 mg daily  Urinary retention: Foley was placed on 8/14.  We will give a voiding trial.   She was having hematuria prior to admission and was seen by urology, treated with 3 days course of ciprofloxacin.  She is to follow-up with urology as an outpatient.hematuria has resolved  Obesity: BMI 33.8.  Deconditioning/weakness/debility: PT/OT recommended skilled nursing facility discharge.  Currently unable to discharge because of persistent agitation, need to be in restraints.TOC aware.  Will discharge to skilled nursing facility as soon as her mental status stabilizes         DVT prophylaxis:Xarelto Code Status: Full Family Communication: Called and discussed with daughter on phone on 06/21/21 Status is: Inpatient  Remains inpatient appropriate because:Unsafe d/c plan  Dispo: The patient is from: Home              Anticipated d/c is to: SNF              Patient currently is not medically stable to d/c.   Difficult to place patient No    Consultants:  Neurology, psychiatry  Procedures: Thrombectomy  Antimicrobials:  Anti-infectives (From admission, onward)    None       Subjective:  Patient seen and examined at the bedside this morning.  She was hemodynamically stable during my evaluation.  She looks pleasant,  smiling, agitated and frequently moving his body, trying to get out.  She was in restraints.  She was moving her extremities quite well.  She follows commands but not oriented.Did not speak  Objective: Vitals:   06/20/21 2146 06/21/21 0219 06/21/21 0415 06/21/21 0849  BP: (!) 143/75 (!) 142/95 (!) 146/74 (!) 126/95  Pulse: 69 65 65 71  Resp: '20 18 18 16  '$ Temp: 98.5 F (36.9 C) 98.5 F (36.9 C) 98.4 F (36.9 C) 97.7 F (36.5 C)  TempSrc: Oral Oral Oral Oral  SpO2: 97% 96% 98% 98%  Weight:      Height:        Intake/Output Summary (Last 24 hours) at 06/21/2021 1133 Last data filed at 06/21/2021 0430 Gross per 24 hour  Intake --  Output 1350 ml  Net -1350 ml   Filed Weights   06/12/21 0743  Weight: 92.3 kg    Examination:  General exam: Mildly agitated, deconditioned, debilitated, pleasantly confused,on waist restrain HEENT: PERRL Respiratory system:  no wheezes or crackles  Cardiovascular system: Irregularly irregular rhythm Gastrointestinal system: Abdomen is nondistended, soft and nontender. Central nervous system: Alert and awake but not oriented,obeys commands Extremities: No edema, no clubbing ,no cyanosis Skin: No rashes, no ulcers,no icterus      Data Reviewed: I have personally reviewed following labs and imaging studies  CBC: Recent Labs  Lab 06/17/21 0907 06/19/21 1631  WBC 6.7 8.2  NEUTROABS  --  6.3  HGB 11.7* 11.7*  HCT 35.9* 35.0*  MCV 91.1 89.3  PLT 303 [id number]*   Basic Metabolic Panel: Recent Labs  Lab 06/15/21 1040 06/19/21 1631  NA 139 138  K 3.9 3.9  CL 107 103  CO2 22 29  GLUCOSE 84 111*  BUN 16 13  CREATININE 0.64 0.79  CALCIUM 8.8* 8.5*   GFR: Estimated Creatinine Clearance: 63 mL/min (by C-G formula based on SCr of 0.79 mg/dL). Liver Function Tests: Recent Labs  Lab 06/19/21 1631  AST 21  ALT 13  ALKPHOS 65  BILITOT 1.1  PROT 6.0*  ALBUMIN 2.6*   No results for input(s): LIPASE, AMYLASE in the last 168 hours. No  results for input(s): AMMONIA in the last 168 hours. Coagulation Profile: No results for input(s): INR, PROTIME in the last 168 hours. Cardiac Enzymes: Recent Labs  Lab 06/19/21 1631  CKTOTAL 43  CKMB 1.3   BNP (last 3 results) No results for input(s): PROBNP in the last 8760 hours. HbA1C: No results for input(s): HGBA1C in the last 72 hours. CBG: No results for input(s): GLUCAP in the last 168 hours. Lipid Profile: No results for input(s): CHOL, HDL, LDLCALC, TRIG, CHOLHDL, LDLDIRECT in the last 72 hours. Thyroid Function Tests: No results for input(s): TSH, T4TOTAL, FREET4, T3FREE, THYROIDAB in the last 72 hours. Anemia Panel: No results for input(s): VITAMINB12, FOLATE, FERRITIN, TIBC, IRON, RETICCTPCT in the last 72 hours. Sepsis Labs: No results for input(s): PROCALCITON, LATICACIDVEN in the last 168 hours.  Recent Results (from the past 240 hour(s))  Culture, blood (single)     Status: None (Preliminary result)   Collection Time: 06/17/21  9:00 AM   Specimen: BLOOD LEFT HAND  Result Value Ref Range Status   Specimen Description BLOOD  LEFT HAND  Final   Special Requests   Final    BOTTLES DRAWN AEROBIC ONLY Blood Culture adequate volume   Culture   Final    NO GROWTH 3 DAYS Performed at [facility], [address], [place], [place] [address]    Report Status PENDING  Incomplete         Radiology Studies: No results found.      Scheduled Meds:  atorvastatin  40 mg Oral QHS   Chlorhexidine Gluconate Cloth  6 each Topical Daily   divalproex  125 mg Oral Q12H   hydrALAZINE  25 mg Oral BID   QUEtiapine  50 mg Oral TID   rivaroxaban  20 mg Oral Q supper   tamsulosin  0.4 mg Oral QPC supper   Continuous Infusions:  sodium chloride       LOS: 12 days    Time spent: 35 mins.More than 50% of that time was spent in counseling and/or coordination of care.      Shelly Coss, MD [facility] P8/18/2022, 11:33 AM

## 2021-06-22 DIAGNOSIS — F0391 Unspecified dementia with behavioral disturbance: Secondary | ICD-10-CM | POA: Diagnosis not present

## 2021-06-22 LAB — CULTURE, BLOOD (SINGLE)
Culture: NO GROWTH
Special Requests: ADEQUATE

## 2021-06-22 LAB — VALPROIC ACID LEVEL: Valproic Acid Lvl: 19 ug/mL — ABNORMAL LOW (ref 50.0–100.0)

## 2021-06-22 NOTE — Plan of Care (Signed)
  Problem: Safety: Goal: Non-violent Restraint(s) Outcome: Progressing   Problem: Education: Goal: Knowledge of disease or condition will improve Outcome: Progressing Goal: Knowledge of secondary prevention will improve Outcome: Progressing Goal: Knowledge of patient specific risk factors addressed and post discharge goals established will improve Outcome: Progressing Goal: Individualized Educational Video(s) Outcome: Progressing   Problem: Coping: Goal: Will identify appropriate support needs Outcome: Progressing   Problem: Health Behavior/Discharge Planning: Goal: Ability to manage health-related needs will improve Outcome: Progressing   Problem: Self-Care: Goal: Ability to participate in self-care as condition permits will improve Outcome: Progressing Goal: Verbalization of feelings and concerns over difficulty with self-care will improve Outcome: Progressing Goal: Ability to communicate needs accurately will improve Outcome: Progressing   Problem: Nutrition: Goal: Risk of aspiration will decrease Outcome: Progressing Goal: Dietary intake will improve Outcome: Progressing   Problem: Ischemic Stroke/TIA Tissue Perfusion: Goal: Complications of ischemic stroke/TIA will be minimized Outcome: Progressing   Problem: Education: Goal: Knowledge of General Education information will improve Description: Including pain rating scale, medication(s)/side effects and non-pharmacologic comfort measures Outcome: Progressing   Problem: Health Behavior/Discharge Planning: Goal: Ability to manage health-related needs will improve Outcome: Progressing   Problem: Clinical Measurements: Goal: Ability to maintain clinical measurements within normal limits will improve Outcome: Progressing Goal: Will remain free from infection Outcome: Progressing Goal: Diagnostic test results will improve Outcome: Progressing Goal: Respiratory complications will improve Outcome:  Progressing Goal: Cardiovascular complication will be avoided Outcome: Progressing   Problem: Activity: Goal: Risk for activity intolerance will decrease Outcome: Progressing   Problem: Nutrition: Goal: Adequate nutrition will be maintained Outcome: Progressing   Problem: Coping: Goal: Level of anxiety will decrease Outcome: Progressing   Problem: Elimination: Goal: Will not experience complications related to bowel motility Outcome: Progressing Goal: Will not experience complications related to urinary retention Outcome: Progressing   Problem: Pain Managment: Goal: General experience of comfort will improve Outcome: Progressing   Problem: Safety: Goal: Ability to remain free from injury will improve Outcome: Progressing   Problem: Skin Integrity: Goal: Risk for impaired skin integrity will decrease Outcome: Progressing

## 2021-06-22 NOTE — Consult Note (Signed)
   [facility] CM Inpatient Consult   06/22/2021  [name] [birth year] [id number]  [facility] [facility] [[facility]] Patient: [name] Medicare  Updated:  Disposition  Patient is now being recommended for a skilled nursing facility level of care.  Plan:  Continue to follow for ongoing needs.  Natividad Brood, RN BSN [facility] Liaison  [phone] business mobile phone Toll free office [phone]  Fax number: [phone] [email or web address]

## 2021-06-22 NOTE — Progress Notes (Signed)
PROGRESS NOTE  [name]  [id number] DOB: 1941-09-16 DOA: 06/09/2021 PCP: [name], DO   Brief Narrative: 80 year old female with past history of chronic A. fib on Xarelto which was recently held due to hematuria, dementia who presented to [facility] with acute onset aphasia and right-sided weakness.  Code stroke was called.  Noncontrast head CT was negative.  CTAs of head and neck showed a left M2  occlusion.  Patient was not given tPA due to recent stroke and gross materia.  She underwent mechanical thrombectomy on 8/6 with TICI 3 revascularization.  Post procedure patient remained intubated in ICU and was admitted to [facility] service.  She was extubated on 8/7.  Required Precedex in the ICU for another 2 days, off since 8/9.  Respiratory status has been stable on room air since extubation.   [facility] assumed care on 8/10.  Psychiatry is consulted and assisting with management of patient's behavioral disturbances and she has been started on Seroquel with dose adjustments being made per psych.  06/22/2021: Patient seen.  No new changes.  Patient is awaiting placement.  Assessment & Plan: Active Problems:   Stroke [facility])   Acute ischemic left MCA stroke (HCC)   Middle cerebral artery embolism, left   Agitation due to dementia [facility])  Acute left MCA stroke status post mechanical thrombectomy with TICI 3 -cardioembolic etiology due to being off Xarelto in the setting of hematuria Hypertension Hypokalemia /hypomagnesemia Bradycardia -resolved CT head without acute findings. CTA head and neck showed left M2 occlusion MRI brain with right ICA, left insular cortex and left frontal infarcts Echo shows EF 65 to 70% LDL 38 A1c 5.3% --Stroke team following --Continue Xarelto and lovastatin --Frequent reorientation and delirium precautions --Monitor and replace electrolytes - Needs rehabilitation at SNF vs. CIR.   Generalized weakness and immobility -  -- Insurance declined authorization for CIR.   -- SNF placement pending, TOC following --Pulmonary hygiene, OOB to chair, PT and OT   Hematuria -prior to admission and resolved.  Xarelto has been resumed 8/8, had been on hold per PCP until patient saw urology.  Seen by urology last week (PTA), given 3-day Cipro. - Recheck as outpatient.   History of stroke -in May of this year, left MCA scattered punctate infarcts.  MRA and carotid ultrasound negative.  EF was 60 to 65%, LDL 73, A1c was 5.5%.  Stroke etiology was deemed secondary to noncompliance with Xarelto.     History of NMS  - patient developed NMS after Haldol use during admission in May, dantrolene was not required was monitored closely in ICU. - Use as needed Ativan. - Avoid haldol, using caution with seroquel   Chronic A. Fib -rate controlled. - Continue xarelto   Acute delirium with underlying dementia with behavioral disturbance-no prior formal diagnosis due to multiple cancellations and rescheduling of neurology appointments for evaluation of this. --Appointment pending in September --Baseline cognitive impairment in the home setting with sundowning, requires 24/7 supervision and assistance for bathing.  Able to eat and dress independently but slow and sometimes needs assistance. - Renew posey belt restraint to be used if needed. - Continue seroquel, depakote per psychiatry - Monitor QTc intermittently. Will DC continuous telemetry as this causes significant agitation and is not reliably on the patient anyway.   Hypertension:  - Continue hydralazine 25 mg BID.   Hyperlipidemia:  - Continue atorvastatin '40mg'$    Urinary retention: Most recently placed foley was 8/14.  - Plan voiding trial soon.   Obesity:  Estimated body mass index is 33.86 kg/m as calculated from the following:   Height as of this encounter: '5\' 5"'$  (1.651 m).   Weight as of this encounter: 92.3 kg.  DVT prophylaxis: Xarelto Code Status: Full Family Communication: None at bedside Disposition Plan:   Status is: Inpatient  Remains inpatient appropriate because:Altered mental status and Unsafe d/c plan  Dispo: The patient is from: Home              Anticipated d/c is to: SNF vs. CIR. Treating agitation aggressively which is primary barrier at this time.              Patient currently is medically stable to d/c.   Difficult to place patient Yes  Subjective: No new changes.  Objective: Vitals:   06/22/21 0434 06/22/21 0856 06/22/21 1207 06/22/21 1522  BP: (!) 142/83 (!) 154/91 (!) 148/45 (!) 159/85  Pulse: 64 76 73 70  Resp: '16 14 16   '$ Temp: 98.6 F (37 C) 98.2 F (36.8 C) 98.1 F (36.7 C) 98.4 F (36.9 C)  TempSrc: Oral Oral Oral Oral  SpO2: 100% 98%  100%  Weight:      Height:        Intake/Output Summary (Last 24 hours) at 06/22/2021 1700 Last data filed at 06/22/2021 0900 Gross per 24 hour  Intake 240 ml  Output --  Net 240 ml    Filed Weights   06/12/21 0743  Weight: 92.3 kg    Gen: Elderly female in no distress Pulm: Non-labored breathing room air. Clear to auscultation bilaterally.  CV: Regular rate and rhythm. No murmur, rub, or gallop. No JVD, no pitting pedal edema. GI: Abdomen soft, non-tender, non-distended, with normoactive bowel sounds. No organomegaly or masses felt. Loose lap belt in place. Ext: Warm, no deformities Skin: No acute rashes, lesions or ulcers Neuro: Alert and oriented only to person. Not cooperative with exam.  Psych: UTD  Data Reviewed: I have personally reviewed following labs and imaging studies  CBC: Recent Labs  Lab 06/17/21 0907 06/19/21 1631  WBC 6.7 8.2  NEUTROABS  --  6.3  HGB 11.7* 11.7*  HCT 35.9* 35.0*  MCV 91.1 89.3  PLT 303 408*    Basic Metabolic Panel: Recent Labs  Lab 06/19/21 1631  NA 138  K 3.9  CL 103  CO2 29  GLUCOSE 111*  BUN 13  CREATININE 0.79  CALCIUM 8.5*    GFR: Estimated Creatinine Clearance: 63 mL/min (by C-G formula based on SCr of 0.79 mg/dL). Liver Function Tests: Recent  Labs  Lab 06/19/21 1631  AST 21  ALT 13  ALKPHOS 65  BILITOT 1.1  PROT 6.0*  ALBUMIN 2.6*    No results for input(s): LIPASE, AMYLASE in the last 168 hours. No results for input(s): AMMONIA in the last 168 hours. Coagulation Profile: No results for input(s): INR, PROTIME in the last 168 hours. Cardiac Enzymes: Recent Labs  Lab 06/19/21 1631  CKTOTAL 43  CKMB 1.3    BNP (last 3 results) No results for input(s): PROBNP in the last 8760 hours. HbA1C: No results for input(s): HGBA1C in the last 72 hours. CBG: No results for input(s): GLUCAP in the last 168 hours. Lipid Profile: No results for input(s): CHOL, HDL, LDLCALC, TRIG, CHOLHDL, LDLDIRECT in the last 72 hours. Thyroid Function Tests: No results for input(s): TSH, T4TOTAL, FREET4, T3FREE, THYROIDAB in the last 72 hours. Anemia Panel: No results for input(s): VITAMINB12, FOLATE, FERRITIN, TIBC, IRON, RETICCTPCT in the  last 72 hours. Urine analysis:    Component Value Date/Time   COLORURINE AMBER (A) 03/31/2021 1702   APPEARANCEUR Clear 06/06/2021 1130   LABSPEC 1.040 (H) 03/31/2021 1702   PHURINE 5.0 03/31/2021 1702   GLUCOSEU Negative 06/06/2021 1130   HGBUR SMALL (A) 03/31/2021 1702   BILIRUBINUR Negative 06/06/2021 1130   KETONESUR 5 (A) 03/31/2021 1702   PROTEINUR Negative 06/06/2021 1130   PROTEINUR 100 (A) 03/31/2021 1702   NITRITE Negative 06/06/2021 1130   NITRITE NEGATIVE 03/31/2021 1702   LEUKOCYTESUR 1+ (A) 06/06/2021 1130   LEUKOCYTESUR SMALL (A) 03/31/2021 1702   Recent Results (from the past 240 hour(s))  Culture, blood (single)     Status: None   Collection Time: 06/17/21  9:00 AM   Specimen: BLOOD LEFT HAND  Result Value Ref Range Status   Specimen Description BLOOD LEFT HAND  Final   Special Requests   Final    BOTTLES DRAWN AEROBIC ONLY Blood Culture adequate volume   Culture   Final    NO GROWTH 5 DAYS Performed at [facility], [address] [place], [place] [address]     Report Status 06/22/2021 FINAL  Final      Radiology Studies: No results found.  Scheduled Meds:  atorvastatin  40 mg Oral QHS   Chlorhexidine Gluconate Cloth  6 each Topical Daily   divalproex  125 mg Oral Q12H   hydrALAZINE  25 mg Oral BID   QUEtiapine  50 mg Oral TID   rivaroxaban  20 mg Oral Q supper   tamsulosin  0.4 mg Oral QPC supper   Continuous Infusions:  sodium chloride       LOS: 13 days   Time spent: 25 minutes.  Bonnell Public, MD [facility] [email or web address] 06/22/2021, 5:00 PM

## 2021-06-22 NOTE — Progress Notes (Signed)
PT Cancellation Note  Patient Details Name: [name] MRN: [id number] DOB: Feb 06, 1941   Cancelled Treatment:    Reason Eval/Treat Not Completed: Other (comment) Patient recently put in restraints due to agitation. Patient not following commands due to agitation. PT will re-attempt as time allows.   Shailen Thielen A. Gilford Rile PT, DPT Acute Rehabilitation Services Pager [phone] Office [phone]    [name] 06/22/2021, 3:26 PM

## 2021-06-22 NOTE — Plan of Care (Signed)
Problem: Safety: Goal: Non-violent Restraint(s) 06/22/2021 1808 by Drucie Ip I, RN Outcome: Progressing 06/22/2021 1126 by Drucie Ip I, RN Outcome: Progressing   Problem: Education: Goal: Knowledge of disease or condition will improve 06/22/2021 1808 by Drucie Ip I, RN Outcome: Progressing 06/22/2021 1126 by Drucie Ip I, RN Outcome: Progressing Goal: Knowledge of secondary prevention will improve 06/22/2021 1808 by Drucie Ip I, RN Outcome: Progressing 06/22/2021 1126 by Drucie Ip I, RN Outcome: Progressing Goal: Knowledge of patient specific risk factors addressed and post discharge goals established will improve 06/22/2021 1808 by Drucie Ip I, RN Outcome: Progressing 06/22/2021 1126 by Drucie Ip I, RN Outcome: Progressing Goal: Individualized Educational Video(s) 06/22/2021 1808 by Drucie Ip I, RN Outcome: Progressing 06/22/2021 1126 by Drucie Ip I, RN Outcome: Progressing   Problem: Coping: Goal: Will identify appropriate support needs 06/22/2021 1808 by Drucie Ip I, RN Outcome: Progressing 06/22/2021 1126 by Drucie Ip I, RN Outcome: Progressing   Problem: Health Behavior/Discharge Planning: Goal: Ability to manage health-related needs will improve 06/22/2021 1808 by Drucie Ip I, RN Outcome: Progressing 06/22/2021 1126 by Drucie Ip I, RN Outcome: Progressing   Problem: Self-Care: Goal: Ability to participate in self-care as condition permits will improve 06/22/2021 1808 by Drucie Ip I, RN Outcome: Progressing 06/22/2021 1126 by Drucie Ip I, RN Outcome: Progressing Goal: Verbalization of feelings and concerns over difficulty with self-care will improve 06/22/2021 1808 by Drucie Ip I, RN Outcome: Progressing 06/22/2021 1126 by Drucie Ip I, RN Outcome: Progressing Goal: Ability to communicate needs accurately will improve 06/22/2021 1808  by Drucie Ip I, RN Outcome: Progressing 06/22/2021 1126 by Drucie Ip I, RN Outcome: Progressing   Problem: Nutrition: Goal: Risk of aspiration will decrease 06/22/2021 1808 by Drucie Ip I, RN Outcome: Progressing 06/22/2021 1126 by Drucie Ip I, RN Outcome: Progressing Goal: Dietary intake will improve 06/22/2021 1808 by Drucie Ip I, RN Outcome: Progressing 06/22/2021 1126 by Drucie Ip I, RN Outcome: Progressing   Problem: Ischemic Stroke/TIA Tissue Perfusion: Goal: Complications of ischemic stroke/TIA will be minimized 06/22/2021 1808 by Drucie Ip I, RN Outcome: Progressing 06/22/2021 1126 by Drucie Ip I, RN Outcome: Progressing   Problem: Education: Goal: Knowledge of General Education information will improve Description: Including pain rating scale, medication(s)/side effects and non-pharmacologic comfort measures 06/22/2021 1808 by Drucie Ip I, RN Outcome: Progressing 06/22/2021 1126 by Drucie Ip I, RN Outcome: Progressing   Problem: Health Behavior/Discharge Planning: Goal: Ability to manage health-related needs will improve 06/22/2021 1808 by Drucie Ip I, RN Outcome: Progressing 06/22/2021 1126 by Drucie Ip I, RN Outcome: Progressing   Problem: Clinical Measurements: Goal: Ability to maintain clinical measurements within normal limits will improve 06/22/2021 1808 by Drucie Ip I, RN Outcome: Progressing 06/22/2021 1126 by Drucie Ip I, RN Outcome: Progressing Goal: Will remain free from infection 06/22/2021 1808 by Drucie Ip I, RN Outcome: Progressing 06/22/2021 1126 by Drucie Ip I, RN Outcome: Progressing Goal: Diagnostic test results will improve 06/22/2021 1808 by Drucie Ip I, RN Outcome: Progressing 06/22/2021 1126 by Drucie Ip I, RN Outcome: Progressing Goal: Respiratory complications will improve 06/22/2021 1808 by Drucie Ip I, RN Outcome: Progressing 06/22/2021 1126 by Drucie Ip I, RN Outcome: Progressing Goal: Cardiovascular complication will be avoided 06/22/2021 1808 by Drucie Ip I, RN Outcome: Progressing 06/22/2021 1126 by Drucie Ip I, RN Outcome: Progressing   Problem: Activity: Goal: Risk for activity intolerance will decrease 06/22/2021 1808 by Drucie Ip I, RN Outcome: Progressing 06/22/2021 1126 by Drucie Ip I, RN Outcome: Progressing  Problem: Nutrition: Goal: Adequate nutrition will be maintained 06/22/2021 1808 by Drucie Ip I, RN Outcome: Progressing 06/22/2021 1126 by Drucie Ip I, RN Outcome: Progressing   Problem: Coping: Goal: Level of anxiety will decrease 06/22/2021 1808 by Drucie Ip I, RN Outcome: Progressing 06/22/2021 1126 by Drucie Ip I, RN Outcome: Progressing   Problem: Elimination: Goal: Will not experience complications related to bowel motility 06/22/2021 1808 by Drucie Ip I, RN Outcome: Progressing 06/22/2021 1126 by Drucie Ip I, RN Outcome: Progressing Goal: Will not experience complications related to urinary retention 06/22/2021 1808 by Drucie Ip I, RN Outcome: Progressing 06/22/2021 1126 by Drucie Ip I, RN Outcome: Progressing   Problem: Pain Managment: Goal: General experience of comfort will improve 06/22/2021 1808 by Drucie Ip I, RN Outcome: Progressing 06/22/2021 1126 by Drucie Ip I, RN Outcome: Progressing   Problem: Safety: Goal: Ability to remain free from injury will improve 06/22/2021 1808 by Drucie Ip I, RN Outcome: Progressing 06/22/2021 1126 by Drucie Ip I, RN Outcome: Progressing   Problem: Skin Integrity: Goal: Risk for impaired skin integrity will decrease 06/22/2021 1808 by Drucie Ip I, RN Outcome: Progressing 06/22/2021 1126 by Drucie Ip I, RN Outcome: Progressing

## 2021-06-23 DIAGNOSIS — F0391 Unspecified dementia with behavioral disturbance: Secondary | ICD-10-CM | POA: Diagnosis not present

## 2021-06-23 NOTE — Progress Notes (Signed)
PROGRESS NOTE  [name]  [id number] DOB: 07-04-1941 DOA: 06/09/2021 PCP: [name], DO   Brief Narrative: 80 year old female with past history of chronic A. fib on Xarelto which was recently held due to hematuria, dementia who presented to [facility] with acute onset aphasia and right-sided weakness.  Code stroke was called.  Noncontrast head CT was negative.  CTAs of head and neck showed a left M2  occlusion.  Patient was not given tPA due to recent stroke and gross materia.  She underwent mechanical thrombectomy on 8/6 with TICI 3 revascularization.  Post procedure patient remained intubated in ICU and was admitted to [facility] service.  She was extubated on 8/7.  Required Precedex in the ICU for another 2 days, off since 8/9.  Respiratory status has been stable on room air since extubation.   TRH assumed care on 8/10.  Psychiatry is consulted and assisting with management of patient's behavioral disturbances and she has been started on Seroquel with dose adjustments being made per psych.  06/23/2021: Patient seen.  No new changes.  Patient is awaiting placement.  Assessment & Plan: Active Problems:   Stroke [facility])   Acute ischemic left MCA stroke (HCC)   Middle cerebral artery embolism, left   Agitation due to dementia [facility])  Acute left MCA stroke status post mechanical thrombectomy with TICI 3 -cardioembolic etiology due to being off Xarelto in the setting of hematuria Hypertension Hypokalemia /hypomagnesemia Bradycardia -resolved CT head without acute findings. CTA head and neck showed left M2 occlusion MRI brain with right ICA, left insular cortex and left frontal infarcts Echo shows EF 65 to 70% LDL 38 A1c 5.3% --Stroke team following --Continue Xarelto and lovastatin --Frequent reorientation and delirium precautions --Monitor and replace electrolytes - Needs rehabilitation at SNF vs. CIR.   Generalized weakness and immobility -  -- Insurance declined authorization for CIR.   -- SNF placement pending, TOC following --Pulmonary hygiene, OOB to chair, PT and OT   Hematuria -prior to admission and resolved.  Xarelto has been resumed 8/8, had been on hold per PCP until patient saw urology.  Seen by urology last week (PTA), given 3-day Cipro. - Recheck as outpatient.   History of stroke -in May of this year, left MCA scattered punctate infarcts.  MRA and carotid ultrasound negative.  EF was 60 to 65%, LDL 73, A1c was 5.5%.  Stroke etiology was deemed secondary to noncompliance with Xarelto.     History of NMS  - patient developed NMS after Haldol use during admission in May, dantrolene was not required was monitored closely in ICU. - Use as needed Ativan. - Avoid haldol, using caution with seroquel   Chronic A. Fib -rate controlled. - Continue xarelto   Acute delirium with underlying dementia with behavioral disturbance-no prior formal diagnosis due to multiple cancellations and rescheduling of neurology appointments for evaluation of this. --Appointment pending in September --Baseline cognitive impairment in the home setting with sundowning, requires 24/7 supervision and assistance for bathing.  Able to eat and dress independently but slow and sometimes needs assistance. - Renew posey belt restraint to be used if needed. - Continue seroquel, depakote per psychiatry - Monitor QTc intermittently. Will DC continuous telemetry as this causes significant agitation and is not reliably on the patient anyway.   Hypertension:  - Continue hydralazine 25 mg BID.   Hyperlipidemia:  - Continue atorvastatin '40mg'$    Urinary retention: Most recently placed foley was 8/14.  - Plan voiding trial soon.   Obesity:  Estimated body mass index is 33.86 kg/m as calculated from the following:   Height as of this encounter: '5\' 5"'$  (1.651 m).   Weight as of this encounter: 92.3 kg.  DVT prophylaxis: Xarelto Code Status: Full Family Communication: None at bedside Disposition Plan:   Status is: Inpatient  Remains inpatient appropriate because:Altered mental status and Unsafe d/c plan  Dispo: The patient is from: Home              Anticipated d/c is to: SNF vs. CIR. Treating agitation aggressively which is primary barrier at this time.              Patient currently is medically stable to d/c.   Difficult to place patient Yes  Subjective: No new changes.  Objective: Vitals:   06/22/21 1207 06/22/21 1522 06/22/21 1933 06/23/21 0809  BP: (!) 148/45 (!) 159/85 (!) 144/76 (!) 143/96  Pulse: 73 70 70 70  Resp: '16 14 16 14  '$ Temp: 98.1 F (36.7 C) 98.4 F (36.9 C) 99.1 F (37.3 C) 98.1 F (36.7 C)  TempSrc: Oral Oral Oral Oral  SpO2:  100% 96% 99%  Weight:      Height:        Intake/Output Summary (Last 24 hours) at 06/23/2021 1123 Last data filed at 06/22/2021 1700 Gross per 24 hour  Intake 360 ml  Output 600 ml  Net -240 ml    Filed Weights   06/12/21 0743  Weight: 92.3 kg    Gen: Elderly female in no distress Pulm: Non-labored breathing room air. Clear to auscultation bilaterally.  CV: Regular rate and rhythm. No murmur, rub, or gallop. No JVD, no pitting pedal edema. GI: Abdomen soft, non-tender, non-distended, with normoactive bowel sounds. No organomegaly or masses felt. Loose lap belt in place. Ext: Warm, no deformities Skin: No acute rashes, lesions or ulcers Neuro: Alert and oriented only to person. Not cooperative with exam.  Psych: UTD  Data Reviewed: I have personally reviewed following labs and imaging studies  CBC: Recent Labs  Lab 06/17/21 0907 06/19/21 1631  WBC 6.7 8.2  NEUTROABS  --  6.3  HGB 11.7* 11.7*  HCT 35.9* 35.0*  MCV 91.1 89.3  PLT 303 408*    Basic Metabolic Panel: Recent Labs  Lab 06/19/21 1631  NA 138  K 3.9  CL 103  CO2 29  GLUCOSE 111*  BUN 13  CREATININE 0.79  CALCIUM 8.5*    GFR: Estimated Creatinine Clearance: 63 mL/min (by C-G formula based on SCr of 0.79 mg/dL). Liver Function  Tests: Recent Labs  Lab 06/19/21 1631  AST 21  ALT 13  ALKPHOS 65  BILITOT 1.1  PROT 6.0*  ALBUMIN 2.6*    No results for input(s): LIPASE, AMYLASE in the last 168 hours. No results for input(s): AMMONIA in the last 168 hours. Coagulation Profile: No results for input(s): INR, PROTIME in the last 168 hours. Cardiac Enzymes: Recent Labs  Lab 06/19/21 1631  CKTOTAL 43  CKMB 1.3    BNP (last 3 results) No results for input(s): PROBNP in the last 8760 hours. HbA1C: No results for input(s): HGBA1C in the last 72 hours. CBG: No results for input(s): GLUCAP in the last 168 hours. Lipid Profile: No results for input(s): CHOL, HDL, LDLCALC, TRIG, CHOLHDL, LDLDIRECT in the last 72 hours. Thyroid Function Tests: No results for input(s): TSH, T4TOTAL, FREET4, T3FREE, THYROIDAB in the last 72 hours. Anemia Panel: No results for input(s): VITAMINB12, FOLATE, FERRITIN, TIBC, IRON, RETICCTPCT in  the last 72 hours. Urine analysis:    Component Value Date/Time   COLORURINE AMBER (A) 03/31/2021 1702   APPEARANCEUR Clear 06/06/2021 1130   LABSPEC 1.040 (H) 03/31/2021 1702   PHURINE 5.0 03/31/2021 1702   GLUCOSEU Negative 06/06/2021 1130   HGBUR SMALL (A) 03/31/2021 1702   BILIRUBINUR Negative 06/06/2021 1130   KETONESUR 5 (A) 03/31/2021 1702   PROTEINUR Negative 06/06/2021 1130   PROTEINUR 100 (A) 03/31/2021 1702   NITRITE Negative 06/06/2021 1130   NITRITE NEGATIVE 03/31/2021 1702   LEUKOCYTESUR 1+ (A) 06/06/2021 1130   LEUKOCYTESUR SMALL (A) 03/31/2021 1702   Recent Results (from the past 240 hour(s))  Culture, blood (single)     Status: None   Collection Time: 06/17/21  9:00 AM   Specimen: BLOOD LEFT HAND  Result Value Ref Range Status   Specimen Description BLOOD LEFT HAND  Final   Special Requests   Final    BOTTLES DRAWN AEROBIC ONLY Blood Culture adequate volume   Culture   Final    NO GROWTH 5 DAYS Performed at [facility], [address], [place],  [place] [address]    Report Status 06/22/2021 FINAL  Final      Radiology Studies: No results found.  Scheduled Meds:  atorvastatin  40 mg Oral QHS   Chlorhexidine Gluconate Cloth  6 each Topical Daily   divalproex  125 mg Oral Q12H   hydrALAZINE  25 mg Oral BID   QUEtiapine  50 mg Oral TID   rivaroxaban  20 mg Oral Q supper   tamsulosin  0.4 mg Oral QPC supper   Continuous Infusions:  sodium chloride       LOS: 14 days   Time spent: 25 minutes.  Bonnell Public, MD [facility] [email or web address] 06/23/2021, 11:23 AM

## 2021-06-23 NOTE — Plan of Care (Signed)
  Problem: Safety: Goal: Non-violent Restraint(s) Outcome: Progressing   Problem: Education: Goal: Knowledge of disease or condition will improve Outcome: Progressing Goal: Knowledge of secondary prevention will improve Outcome: Progressing Goal: Knowledge of patient specific risk factors addressed and post discharge goals established will improve Outcome: Progressing Goal: Individualized Educational Video(s) Outcome: Progressing   Problem: Coping: Goal: Will identify appropriate support needs Outcome: Progressing   Problem: Health Behavior/Discharge Planning: Goal: Ability to manage health-related needs will improve Outcome: Progressing   Problem: Self-Care: Goal: Ability to participate in self-care as condition permits will improve Outcome: Progressing Goal: Verbalization of feelings and concerns over difficulty with self-care will improve Outcome: Progressing Goal: Ability to communicate needs accurately will improve Outcome: Progressing   Problem: Nutrition: Goal: Risk of aspiration will decrease Outcome: Progressing Goal: Dietary intake will improve Outcome: Progressing   Problem: Ischemic Stroke/TIA Tissue Perfusion: Goal: Complications of ischemic stroke/TIA will be minimized Outcome: Progressing   Problem: Education: Goal: Knowledge of General Education information will improve Description: Including pain rating scale, medication(s)/side effects and non-pharmacologic comfort measures Outcome: Progressing   Problem: Health Behavior/Discharge Planning: Goal: Ability to manage health-related needs will improve Outcome: Progressing   Problem: Clinical Measurements: Goal: Ability to maintain clinical measurements within normal limits will improve Outcome: Progressing Goal: Will remain free from infection Outcome: Progressing Goal: Diagnostic test results will improve Outcome: Progressing Goal: Respiratory complications will improve Outcome:  Progressing Goal: Cardiovascular complication will be avoided Outcome: Progressing   Problem: Activity: Goal: Risk for activity intolerance will decrease Outcome: Progressing   Problem: Nutrition: Goal: Adequate nutrition will be maintained Outcome: Progressing   Problem: Coping: Goal: Level of anxiety will decrease Outcome: Progressing   Problem: Elimination: Goal: Will not experience complications related to bowel motility Outcome: Progressing Goal: Will not experience complications related to urinary retention Outcome: Progressing   Problem: Pain Managment: Goal: General experience of comfort will improve Outcome: Progressing   Problem: Safety: Goal: Ability to remain free from injury will improve Outcome: Progressing   Problem: Skin Integrity: Goal: Risk for impaired skin integrity will decrease Outcome: Progressing

## 2021-06-24 DIAGNOSIS — F0391 Unspecified dementia with behavioral disturbance: Secondary | ICD-10-CM | POA: Diagnosis not present

## 2021-06-24 MED ORDER — DEXTROSE 10 % IV SOLN: INTRAVENOUS | Status: DC

## 2021-06-24 NOTE — Progress Notes (Signed)
Bilateral redness and flakiness around scalp hairline noted this afternoon, MD is aware and assessed. Per MD instructions, please refrain from using any soap products on her scalp or face.

## 2021-06-24 NOTE — Plan of Care (Signed)
  Problem: Safety: Goal: Non-violent Restraint(s) Outcome: Progressing   Problem: Education: Goal: Knowledge of disease or condition will improve Outcome: Progressing Goal: Knowledge of secondary prevention will improve Outcome: Progressing Goal: Knowledge of patient specific risk factors addressed and post discharge goals established will improve Outcome: Progressing Goal: Individualized Educational Video(s) Outcome: Progressing   Problem: Coping: Goal: Will identify appropriate support needs Outcome: Progressing   Problem: Health Behavior/Discharge Planning: Goal: Ability to manage health-related needs will improve Outcome: Progressing   Problem: Self-Care: Goal: Ability to participate in self-care as condition permits will improve Outcome: Progressing Goal: Verbalization of feelings and concerns over difficulty with self-care will improve Outcome: Progressing Goal: Ability to communicate needs accurately will improve Outcome: Progressing   Problem: Nutrition: Goal: Risk of aspiration will decrease Outcome: Progressing Goal: Dietary intake will improve Outcome: Progressing   Problem: Ischemic Stroke/TIA Tissue Perfusion: Goal: Complications of ischemic stroke/TIA will be minimized Outcome: Progressing   Problem: Education: Goal: Knowledge of General Education information will improve Description: Including pain rating scale, medication(s)/side effects and non-pharmacologic comfort measures Outcome: Progressing   Problem: Health Behavior/Discharge Planning: Goal: Ability to manage health-related needs will improve Outcome: Progressing   Problem: Clinical Measurements: Goal: Ability to maintain clinical measurements within normal limits will improve Outcome: Progressing Goal: Will remain free from infection Outcome: Progressing Goal: Diagnostic test results will improve Outcome: Progressing Goal: Respiratory complications will improve Outcome:  Progressing Goal: Cardiovascular complication will be avoided Outcome: Progressing   Problem: Coping: Goal: Level of anxiety will decrease Outcome: Progressing   Problem: Nutrition: Goal: Adequate nutrition will be maintained Outcome: Progressing   Problem: Elimination: Goal: Will not experience complications related to bowel motility Outcome: Progressing Goal: Will not experience complications related to urinary retention Outcome: Progressing

## 2021-06-24 NOTE — Progress Notes (Signed)
PROGRESS NOTE  [name]  [id number] DOB: [birth year] DOA: 06/09/2021 PCP: [name], DO   Brief Narrative: 80 year old female with past history of chronic A. fib on Xarelto which was recently held due to hematuria, dementia who presented to [facility] with acute onset aphasia and right-sided weakness.  Code stroke was called.  Noncontrast head CT was negative.  CTAs of head and neck showed a left M2  occlusion.  Patient was not given tPA due to recent stroke and gross materia.  She underwent mechanical thrombectomy on 8/6 with TICI 3 revascularization.  Post procedure patient remained intubated in ICU and was admitted to [facility] service.  She was extubated on 8/7.  Required Precedex in the ICU for another 2 days, off since 8/9.  Respiratory status has been stable on room air since extubation.   TRH assumed care on 8/10.  Psychiatry is consulted and assisting with management of patient's behavioral disturbances and she has been started on Seroquel with dose adjustments being made per psych.  06/24/2021: Patient seen.  No new changes.  Patient is awaiting placement.  Discussed with the daughter-in-law extensively.  Apparently, daughter-in-law is at the point of attorney for healthcare.   Assessment & Plan: Active Problems:   Stroke [facility])   Acute ischemic left MCA stroke (HCC)   Middle cerebral artery embolism, left   Agitation due to dementia [facility])  Acute left MCA stroke status post mechanical thrombectomy with TICI 3 -cardioembolic etiology due to being off Xarelto in the setting of hematuria Hypertension Hypokalemia /hypomagnesemia Bradycardia -resolved CT head without acute findings. CTA head and neck showed left M2 occlusion MRI brain with right ICA, left insular cortex and left frontal infarcts Echo shows EF 65 to 70% LDL 38 A1c 5.3% --Stroke team following --Continue Xarelto and lovastatin --Frequent reorientation and delirium precautions --Monitor and replace electrolytes -  Needs rehabilitation at SNF vs. CIR.   Generalized weakness and immobility -  -- Insurance declined authorization for CIR.  -- SNF placement pending, TOC following --Pulmonary hygiene, OOB to chair, PT and OT   Hematuria -prior to admission and resolved.  Xarelto has been resumed 8/8, had been on hold per PCP until patient saw urology.  Seen by urology last week (PTA), given 3-day Cipro. - Recheck as outpatient.   History of stroke -in May of this year, left MCA scattered punctate infarcts.  MRA and carotid ultrasound negative.  EF was 60 to 65%, LDL 73, A1c was 5.5%.  Stroke etiology was deemed secondary to noncompliance with Xarelto.     History of NMS  - patient developed NMS after Haldol use during admission in May, dantrolene was not required was monitored closely in ICU. - Use as needed Ativan. - Avoid haldol, using caution with seroquel   Chronic A. Fib -rate controlled. - Continue xarelto   Acute delirium with underlying dementia with behavioral disturbance-no prior formal diagnosis due to multiple cancellations and rescheduling of neurology appointments for evaluation of this. --Appointment pending in September --Baseline cognitive impairment in the home setting with sundowning, requires 24/7 supervision and assistance for bathing.  Able to eat and dress independently but slow and sometimes needs assistance. - Renew posey belt restraint to be used if needed. - Continue seroquel, depakote per psychiatry - Monitor QTc intermittently. Will DC continuous telemetry as this causes significant agitation and is not reliably on the patient anyway.   Hypertension:  - Continue hydralazine 25 mg BID.   Hyperlipidemia:  - Continue atorvastatin '40mg'$   Urinary retention: Most recently placed foley was 8/14.  - Plan voiding trial soon.   Obesity: Estimated body mass index is 33.86 kg/m as calculated from the following:   Height as of this encounter: '5\' 5"'$  (1.651 m).   Weight as of this  encounter: 92.3 kg.  DVT prophylaxis: Xarelto Code Status: Full Family Communication: None at bedside Disposition Plan:  Status is: Inpatient  Remains inpatient appropriate because:Altered mental status and Unsafe d/c plan  Dispo: The patient is from: Home              Anticipated d/c is to: SNF vs. CIR. Treating agitation aggressively which is primary barrier at this time.              Patient currently is medically stable to d/c.   Difficult to place patient Yes  Subjective: No new changes.  Objective: Vitals:   06/24/21 0724 06/24/21 0830 06/24/21 1109 06/24/21 1515  BP: (!) 185/77 (!) 165/97 129/83 (!) 154/80  Pulse: 65  94 70  Resp: (!) '22 20 18 16  '$ Temp: (!) 97.5 F (36.4 C)  98 F (36.7 C) 98 F (36.7 C)  TempSrc: Oral  Oral Oral  SpO2: 99%  95% 92%  Weight:      Height:        Intake/Output Summary (Last 24 hours) at 06/24/2021 1609 Last data filed at 06/24/2021 0919 Gross per 24 hour  Intake 118 ml  Output --  Net 118 ml    Filed Weights   06/12/21 0743  Weight: 92.3 kg    Gen: Elderly female in no distress Pulm: Non-labored breathing room air. Clear to auscultation bilaterally.  CV: Regular rate and rhythm. No murmur, rub, or gallop. No JVD, no pitting pedal edema. GI: Abdomen soft, non-tender, non-distended, with normoactive bowel sounds. No organomegaly or masses felt. Loose lap belt in place. Ext: Warm, no deformities Skin: No acute rashes, lesions or ulcers Neuro: Alert and oriented only to person. Not cooperative with exam.  Psych: UTD  Data Reviewed: I have personally reviewed following labs and imaging studies  CBC: Recent Labs  Lab 06/19/21 1631  WBC 8.2  NEUTROABS 6.3  HGB 11.7*  HCT 35.0*  MCV 89.3  PLT 408*    Basic Metabolic Panel: Recent Labs  Lab 06/19/21 1631  NA 138  K 3.9  CL 103  CO2 29  GLUCOSE 111*  BUN 13  CREATININE 0.79  CALCIUM 8.5*    GFR: Estimated Creatinine Clearance: 63 mL/min (by C-G formula  based on SCr of 0.79 mg/dL). Liver Function Tests: Recent Labs  Lab 06/19/21 1631  AST 21  ALT 13  ALKPHOS 65  BILITOT 1.1  PROT 6.0*  ALBUMIN 2.6*    No results for input(s): LIPASE, AMYLASE in the last 168 hours. No results for input(s): AMMONIA in the last 168 hours. Coagulation Profile: No results for input(s): INR, PROTIME in the last 168 hours. Cardiac Enzymes: Recent Labs  Lab 06/19/21 1631  CKTOTAL 43  CKMB 1.3    BNP (last 3 results) No results for input(s): PROBNP in the last 8760 hours. HbA1C: No results for input(s): HGBA1C in the last 72 hours. CBG: No results for input(s): GLUCAP in the last 168 hours. Lipid Profile: No results for input(s): CHOL, HDL, LDLCALC, TRIG, CHOLHDL, LDLDIRECT in the last 72 hours. Thyroid Function Tests: No results for input(s): TSH, T4TOTAL, FREET4, T3FREE, THYROIDAB in the last 72 hours. Anemia Panel: No results for input(s): VITAMINB12, FOLATE, FERRITIN,  TIBC, IRON, RETICCTPCT in the last 72 hours. Urine analysis:    Component Value Date/Time   COLORURINE AMBER (A) 03/31/2021 1702   APPEARANCEUR Clear 06/06/2021 1130   LABSPEC 1.040 (H) 03/31/2021 1702   PHURINE 5.0 03/31/2021 1702   GLUCOSEU Negative 06/06/2021 1130   HGBUR SMALL (A) 03/31/2021 1702   BILIRUBINUR Negative 06/06/2021 1130   KETONESUR 5 (A) 03/31/2021 1702   PROTEINUR Negative 06/06/2021 1130   PROTEINUR 100 (A) 03/31/2021 1702   NITRITE Negative 06/06/2021 1130   NITRITE NEGATIVE 03/31/2021 1702   LEUKOCYTESUR 1+ (A) 06/06/2021 1130   LEUKOCYTESUR SMALL (A) 03/31/2021 1702   Recent Results (from the past 240 hour(s))  Culture, blood (single)     Status: None   Collection Time: 06/17/21  9:00 AM   Specimen: BLOOD LEFT HAND  Result Value Ref Range Status   Specimen Description BLOOD LEFT HAND  Final   Special Requests   Final    BOTTLES DRAWN AEROBIC ONLY Blood Culture adequate volume   Culture   Final    NO GROWTH 5 DAYS Performed at [facility], [address] [place], [place] [address]    Report Status 06/22/2021 FINAL  Final      Radiology Studies: No results found.  Scheduled Meds:  atorvastatin  40 mg Oral QHS   divalproex  125 mg Oral Q12H   hydrALAZINE  25 mg Oral BID   QUEtiapine  50 mg Oral TID   rivaroxaban  20 mg Oral Q supper   tamsulosin  0.4 mg Oral QPC supper   Continuous Infusions:  sodium chloride       LOS: 15 days   Time spent: 25 minutes.  Bonnell Public, MD [facility] [email or web address] 06/24/2021, 4:09 PM

## 2021-06-25 DIAGNOSIS — F0391 Unspecified dementia with behavioral disturbance: Secondary | ICD-10-CM | POA: Diagnosis not present

## 2021-06-25 MED ORDER — PROPRANOLOL HCL 10 MG PO TABS
10.0000 mg | ORAL_TABLET | Freq: Three times a day (TID) | ORAL | Status: DC
  Administered 2021-06-25 – 2021-07-04 (×23): 10 mg via ORAL
  Filled 2021-06-25 (×29): qty 1

## 2021-06-25 MED ORDER — DEXTROMETHORPHAN-QUINIDINE 20-10 MG PO CAPS
1.0000 | ORAL_CAPSULE | Freq: Two times a day (BID) | ORAL | Status: DC
  Administered 2021-06-25 – 2021-07-01 (×14): 1 via ORAL
  Filled 2021-06-25 (×16): qty 1

## 2021-06-25 NOTE — TOC Progression Note (Signed)
Transition of Care [facility]) - Progression Note    Patient Details  Name: SUZZETTE GASPARRO MRN: [id number] Date of Birth: 28-Sep-1941  Transition of Care [facility]) CM/SW [place], [place] Phone Number: 06/25/2021, 4:12 PM  Clinical Narrative:   CSW spoke with patient's daughter in law, [name], about SNF now that patient has been out of restraints for over 24 hours. If no SNF can be found in [place], then [facility] would be OK with looking in [place] as her grandmother is in [place] in a facility. CSW reached out to [facility] to ask them to review the patient's referral, awaiting response. CSW also sent referral out to [facility] in case. Patient continues to have no bed offers at this time.    Expected Discharge Plan: Skilled Nursing Facility Barriers to Discharge: Continued Medical Work up, [facility] will not accept until restraint criteria met  Expected Discharge Plan and Services Expected Discharge Plan: [place]   Discharge Planning Services: CM Consult   Living arrangements for the past 2 months: Single Family Home                                       Social Determinants of Health (SDOH) Interventions    Readmission Risk Interventions No flowsheet data found.

## 2021-06-25 NOTE — Progress Notes (Signed)
[facility] PROGRESS NOTE    [name]  [id number] DOB: 1941-08-12 DOA: 06/09/2021 PCP: Lisa Roys, DO      Brief Narrative:  [name] is a 80 y.o. F with A. fib on Xarelto, dementia who presented with acute aphasia and right-sided weakness.  In the ER, CT angiogram showed a left M2 occlusion and she underwent mechanical thrombectomy with TICI 3 revascularization.          Assessment & Plan:  Acute ischemic stroke -Continue atorvastatin, Xarelto   Delirium superimposed on dementia -Resume Nuedexta, propranolol - Continue quetiapine  Atrial fibrillation -Continue Xarelto, propranolol  History of neuroleptic malignant syndrome Avoid neuroleptics  Hematuria Follow-up with urology  Hypertension -Continue propranolol  Urinary retention Foley 8/14, needs voiding trial -Continue Flomax   Obesity BMI 33     Disposition: Status is: Inpatient  Remains inpatient appropriate because:Unsafe d/c plan  Dispo: The patient is from: Home              Anticipated d/c is to: SNF              Patient currently is medically stable to d/c.   Difficult to place patient No       Level of care: Med-Surg       MDM: The below labs and imaging reports were reviewed and summarized above.  Medication management as above.    DVT prophylaxis: SCD's Start: 06/09/21 1143 rivaroxaban (XARELTO) tablet 20 mg  Code Status: Full code Family Communication:             Subjective: Patient somewhat agitated today.  Objective: Vitals:   06/25/21 0739 06/25/21 1238 06/25/21 1542 06/25/21 1931  BP: (!) 144/93 110/70 125/74 127/76  Pulse: 60 60 62 63  Resp: '16 18 16 18  '$ Temp: 98.6 F (37 C) 97.6 F (36.4 C) 97.8 F (36.6 C) 98.6 F (37 C)  TempSrc: Oral Oral Oral Oral  SpO2: 98% 98% 98% 97%  Weight:      Height:       No intake or output data in the 24 hours ending 06/25/21 2022 Filed Weights   06/12/21 0743  Weight:  92.3 kg    Examination: General appearance:  adult female, alert and in no obvious distress.   HEENT: Anicteric, conjunctiva pink, lids and lashes normal. No nasal deformity, discharge, epistaxis.  Lips moist, edentulous.   Skin: Warm and dry.  no jaundice.  No suspicious rashes or lesions. Cardiac: RRR, nl S1-S2, no murmurs appreciated.  Capillary refill is brisk.  JVP normal  No LE edema.  Radial  pulses 2+ and symmetric. Respiratory: Normal respiratory rate and rhythm.  CTAB without rales or wheezes. Abdomen: Abdomen soft.  no TTP. No ascites, distension, hepatosplenomegaly.   MSK: No deformities or effusions. Neuro: Awake and makes eye contact, aphasic, moves extremities with generalized weakness, Psych: Makes eye contact but densely aphasic and makes no intelligible responses to questions    Data Reviewed: I have personally reviewed following labs and imaging studies:  CBC: Recent Labs  Lab 06/19/21 1631  WBC 8.2  NEUTROABS 6.3  HGB 11.7*  HCT 35.0*  MCV 89.3  PLT 123XX123*   Basic Metabolic Panel: Recent Labs  Lab 06/19/21 1631  NA 138  K 3.9  CL 103  CO2 29  GLUCOSE 111*  BUN 13  CREATININE 0.79  CALCIUM 8.5*   GFR: Estimated Creatinine Clearance: 63 mL/min (by C-G formula based on SCr of 0.79 mg/dL).  Liver Function Tests: Recent Labs  Lab 06/19/21 1631  AST 21  ALT 13  ALKPHOS 65  BILITOT 1.1  PROT 6.0*  ALBUMIN 2.6*   No results for input(s): LIPASE, AMYLASE in the last 168 hours. No results for input(s): AMMONIA in the last 168 hours. Coagulation Profile: No results for input(s): INR, PROTIME in the last 168 hours. Cardiac Enzymes: Recent Labs  Lab 06/19/21 1631  CKTOTAL 43  CKMB 1.3   BNP (last 3 results) No results for input(s): PROBNP in the last 8760 hours. HbA1C: No results for input(s): HGBA1C in the last 72 hours. CBG: No results for input(s): GLUCAP in the last 168 hours. Lipid Profile: No results for input(s): CHOL, HDL, LDLCALC,  TRIG, CHOLHDL, LDLDIRECT in the last 72 hours. Thyroid Function Tests: No results for input(s): TSH, T4TOTAL, FREET4, T3FREE, THYROIDAB in the last 72 hours. Anemia Panel: No results for input(s): VITAMINB12, FOLATE, FERRITIN, TIBC, IRON, RETICCTPCT in the last 72 hours. Urine analysis:    Component Value Date/Time   COLORURINE AMBER (A) 03/31/2021 1702   APPEARANCEUR Clear 06/06/2021 1130   LABSPEC 1.040 (H) 03/31/2021 1702   PHURINE 5.0 03/31/2021 1702   GLUCOSEU Negative 06/06/2021 1130   HGBUR SMALL (A) 03/31/2021 1702   BILIRUBINUR Negative 06/06/2021 1130   KETONESUR 5 (A) 03/31/2021 1702   PROTEINUR Negative 06/06/2021 1130   PROTEINUR 100 (A) 03/31/2021 1702   NITRITE Negative 06/06/2021 1130   NITRITE NEGATIVE 03/31/2021 1702   LEUKOCYTESUR 1+ (A) 06/06/2021 1130   LEUKOCYTESUR SMALL (A) 03/31/2021 1702   Sepsis Labs: '@LABRCNTIP'$ (procalcitonin:4,lacticacidven:4)  ) Recent Results (from the past 240 hour(s))  Culture, blood (single)     Status: None   Collection Time: 06/17/21  9:00 AM   Specimen: BLOOD LEFT HAND  Result Value Ref Range Status   Specimen Description BLOOD LEFT HAND  Final   Special Requests   Final    BOTTLES DRAWN AEROBIC ONLY Blood Culture adequate volume   Culture   Final    NO GROWTH 5 DAYS Performed at [facility], [place] [address], [place], [place] [address]    Report Status 06/22/2021 FINAL  Final         Radiology Studies: No results found.      Scheduled Meds:  atorvastatin  40 mg Oral QHS   Dextromethorphan-quiNIDine  1 capsule Oral BID   divalproex  125 mg Oral Q12H   propranolol  10 mg Oral TID   QUEtiapine  50 mg Oral TID   rivaroxaban  20 mg Oral Q supper   tamsulosin  0.4 mg Oral QPC supper   Continuous Infusions:  sodium chloride       LOS: 16 days    Time spent: 35 minutes    Lisa Dada, MD [facility] 06/25/2021, 8:22 PM     Please page though [place] or Epic secure chat:  For  [employer], Adult nurse

## 2021-06-26 DIAGNOSIS — F0391 Unspecified dementia with behavioral disturbance: Secondary | ICD-10-CM | POA: Diagnosis not present

## 2021-06-26 NOTE — Plan of Care (Signed)
  Problem: Self-Care: Goal: Verbalization of feelings and concerns over difficulty with self-care will improve Outcome: Progressing   Problem: Nutrition: Goal: Risk of aspiration will decrease Outcome: Progressing Goal: Dietary intake will improve Outcome: Progressing   Problem: Ischemic Stroke/TIA Tissue Perfusion: Goal: Complications of ischemic stroke/TIA will be minimized Outcome: Progressing   Problem: Education: Goal: Knowledge of disease or condition will improve Outcome: Not Progressing Goal: Knowledge of secondary prevention will improve Outcome: Not Progressing Goal: Knowledge of patient specific risk factors addressed and post discharge goals established will improve Outcome: Not Progressing Goal: Individualized Educational Video(s) Outcome: Not Progressing   Problem: Coping: Goal: Will identify appropriate support needs Outcome: Not Progressing   Problem: Health Behavior/Discharge Planning: Goal: Ability to manage health-related needs will improve Outcome: Not Progressing   Problem: Self-Care: Goal: Ability to participate in self-care as condition permits will improve Outcome: Not Progressing Goal: Ability to communicate needs accurately will improve Outcome: Not Progressing   Problem: Education: Goal: Knowledge of General Education information will improve Description: Including pain rating scale, medication(s)/side effects and non-pharmacologic comfort measures Outcome: Not Progressing   Problem: Health Behavior/Discharge Planning: Goal: Ability to manage health-related needs will improve Outcome: Not Progressing   Problem: Clinical Measurements: Goal: Ability to maintain clinical measurements within normal limits will improve Outcome: Not Progressing Goal: Will remain free from infection Outcome: Not Progressing Goal: Diagnostic test results will improve Outcome: Not Progressing Goal: Respiratory complications will improve Outcome: Not  Progressing Goal: Cardiovascular complication will be avoided Outcome: Not Progressing   Problem: Activity: Goal: Risk for activity intolerance will decrease Outcome: Not Progressing   Problem: Nutrition: Goal: Adequate nutrition will be maintained Outcome: Not Progressing   Problem: Coping: Goal: Level of anxiety will decrease Outcome: Not Progressing   Problem: Elimination: Goal: Will not experience complications related to bowel motility Outcome: Not Progressing Goal: Will not experience complications related to urinary retention Outcome: Not Progressing   Problem: Pain Managment: Goal: General experience of comfort will improve Outcome: Not Progressing   Problem: Safety: Goal: Ability to remain free from injury will improve Outcome: Not Progressing   Problem: Skin Integrity: Goal: Risk for impaired skin integrity will decrease Outcome: Not Progressing

## 2021-06-26 NOTE — Progress Notes (Signed)
  Speech Language Pathology Treatment: Cognitive-Linquistic  Patient Details Name: [name] MRN: [id number] DOB: 12-21-1940 Today's Date: 06/26/2021 Time: [id number] SLP Time Calculation (min) (ACUTE ONLY): 11 min  Assessment / Plan / Recommendation Clinical Impression  [name] was slightly irritable today, but redirectable and participatory for short session.  She demonstrates ongoing paraphasias, fluent output. Engaged in naming tasks with 80% accuracy for confrontational naming; followed two step commands within predictable context with 80% accuracy. Demonstrated intermittent perseverations (motor and verbal) but demonstrated ability to cease the response pattern on two occasions with verbal/visual cues.  Recommend ongoing SLP f/u for communication/cognition.   HPI HPI: [name] is a 80 y.o. female with PMH significant for A. fib, recently taken off Xarelto for gross hematuria, hypertension, dementia (sundowning and requiring 24 hour supervision), multiple prior ischemic stroke with most recent on 5/26 who presented to [facility] emergency department with acute onset aphasia and right-sided weakness. CTA was obtained with left MCA M2 occlusion with 51 cc penumbra. Patient was transferred to [facility] for thrombectomy.  MRI of the brain was showing multifocal acute ischemia within the right internal capsule, left frontal white matter and left insula as well as old left occipital and frontal infarcts.  She was intubated briefly.  Per RN family reported onset of cognitive issues in May.  She was scheduled to see a neurologist.      [place] with current plan of care       Recommendations   SLP at SNF                Oral Care Recommendations: Oral care BID Follow up Recommendations: Skilled Nursing facility SLP Visit Diagnosis: Cognitive communication deficit [id number] Plan: Continue with current plan of care       GO              Ryane Canavan L. Tivis Ringer,  [place] CCC/SLP Acute Rehabilitation Services Office number [phone] Pager [phone]   Assunta Curtis 06/26/2021, 3:19 PM

## 2021-06-26 NOTE — Progress Notes (Signed)
[facility] PROGRESS NOTE    Matalin Preister Paone  [id number] DOB: [birth year] DOA: 06/09/2021 PCP: [name], DO      Brief Narrative:  [name] is a 80 y.o. F with A. fib on Xarelto, dementia who presented with acute aphasia and right-sided weakness.  In the ER, CT angiogram showed a left M2 occlusion and she underwent mechanical thrombectomy with TICI 3 revascularization.          Assessment & Plan:  Acute ischemic stroke -  Continue atorvastatin, Xarelto  Admitted with aphasia and right-sided weakness.  MRI showed multifocal acute ischemia in the right internal capsule, left frontal white matter and left insula.  CT angiography showed emergent left M2 branch occlusion with penumbra  To the Cath Lab immediately for endovascular revascularization by Dr. Estanislado Pandy  -Echocardiogram showed no cardiogenic source of embolism -Carotid imaging unremarkable   -Lipids ordered    -Atrial fibrillation: previouslly known -Dysphagia screen ordered in ER -PT eval ordered: recommended SNF -Smoking cessation: not pertinent   Delirium superimposed on dementia This was an issue at her previous hospitalization, but resolved fairly well with propranolol and Nuedexta - Continue Nuedexta, propranolol - Continue quetiapine which is new   Chronic atrial fibrillation Rates controlled - Continue Xarelto, propranolol  History of neuroleptic malignant syndrome Avoid neuroleptics  Hematuria No hematuria observed. -Follow-up with urology as an outpatient  Hypertension Blood pressure controlled - Continue propranolol  Urinary retention, resolved Foley removed - Continue Flomax   Obesity BMI 33     Disposition: Status is: Inpatient  Remains inpatient appropriate because:Unsafe d/c plan  Dispo: The patient is from: Home              Anticipated d/c is to: SNF              Patient currently is medically stable to d/c.   Difficult to place patient  No       Level of care: Med-Surg       MDM: The below labs and imaging reports were reviewed and summarized above.  Medication management as above.    DVT prophylaxis: SCD's Start: 06/09/21 1143 rivaroxaban (XARELTO) tablet 20 mg  Code Status: Full code Family Communication:             Subjective: Patient placid.  She has no complaints.  She has had no fever, no respiratory distress, her appetite is good.  Objective: Vitals:   06/25/21 1542 06/25/21 1931 06/25/21 2339 06/26/21 0336  BP: 125/74 127/76 109/63 130/64  Pulse: 62 63 (!) 51 (!) 51  Resp: '16 18 18 17  '$ Temp: 97.8 F (36.6 C) 98.6 F (37 C) 97.8 F (36.6 C) 98.1 F (36.7 C)  TempSrc: Oral Oral Oral Oral  SpO2: 98% 97% 97% 96%  Weight:      Height:       No intake or output data in the 24 hours ending 06/26/21 0926 Filed Weights   06/12/21 0743  Weight: 92.3 kg    Examination: General appearance: Adult female, sitting in the edge of the bed, folding her sheet like laundry HEENT: Anicteric, conjunctiva pink, lids and lashes normal. No nasal deformity, discharge, epistaxis.  Lips moist, edentulous.   Skin: Warm and dry.  no jaundice.  No suspicious rashes or lesions. Cardiac: Regular rate and rhythm, no murmurs, no lower extremity edema. Respiratory: Normal respiratory rate and rhythm, lungs clear without rales or wheezes Abdomen: Abdomen soft without tenderness palpation or guarding, no ascites  or distention.   MSK: No deformities or effusions. Neuro: Awake and on makes eye contact, makes some responses, unable to tell if she is aphasic or just has dementia.  Movements seem relatively symmetric, she seems imbalanced, speech seems fluent when she does speak Psych: Oriented to self only   Data Reviewed: I have personally reviewed following labs and imaging studies:  CBC: Recent Labs  Lab 06/19/21 1631  WBC 8.2  NEUTROABS 6.3  HGB 11.7*  HCT 35.0*  MCV 89.3  PLT 408*   Basic  Metabolic Panel: Recent Labs  Lab 06/19/21 1631  NA 138  K 3.9  CL 103  CO2 29  GLUCOSE 111*  BUN 13  CREATININE 0.79  CALCIUM 8.5*   GFR: Estimated Creatinine Clearance: 63 mL/min (by C-G formula based on SCr of 0.79 mg/dL). Liver Function Tests: Recent Labs  Lab 06/19/21 1631  AST 21  ALT 13  ALKPHOS 65  BILITOT 1.1  PROT 6.0*  ALBUMIN 2.6*   No results for input(s): LIPASE, AMYLASE in the last 168 hours. No results for input(s): AMMONIA in the last 168 hours. Coagulation Profile: No results for input(s): INR, PROTIME in the last 168 hours. Cardiac Enzymes: Recent Labs  Lab 06/19/21 1631  CKTOTAL 43  CKMB 1.3   BNP (last 3 results) No results for input(s): PROBNP in the last 8760 hours. HbA1C: No results for input(s): HGBA1C in the last 72 hours. CBG: No results for input(s): GLUCAP in the last 168 hours. Lipid Profile: No results for input(s): CHOL, HDL, LDLCALC, TRIG, CHOLHDL, LDLDIRECT in the last 72 hours. Thyroid Function Tests: No results for input(s): TSH, T4TOTAL, FREET4, T3FREE, THYROIDAB in the last 72 hours. Anemia Panel: No results for input(s): VITAMINB12, FOLATE, FERRITIN, TIBC, IRON, RETICCTPCT in the last 72 hours. Urine analysis:    Component Value Date/Time   COLORURINE AMBER (A) 03/31/2021 1702   APPEARANCEUR Clear 06/06/2021 1130   LABSPEC 1.040 (H) 03/31/2021 1702   PHURINE 5.0 03/31/2021 1702   GLUCOSEU Negative 06/06/2021 1130   HGBUR SMALL (A) 03/31/2021 1702   BILIRUBINUR Negative 06/06/2021 1130   KETONESUR 5 (A) 03/31/2021 1702   PROTEINUR Negative 06/06/2021 1130   PROTEINUR 100 (A) 03/31/2021 1702   NITRITE Negative 06/06/2021 1130   NITRITE NEGATIVE 03/31/2021 1702   LEUKOCYTESUR 1+ (A) 06/06/2021 1130   LEUKOCYTESUR SMALL (A) 03/31/2021 1702   Sepsis Labs: '@LABRCNTIP'$ (procalcitonin:4,lacticacidven:4)  ) Recent Results (from the past 240 hour(s))  Culture, blood (single)     Status: None   Collection Time:  06/17/21  9:00 AM   Specimen: BLOOD LEFT HAND  Result Value Ref Range Status   Specimen Description BLOOD LEFT HAND  Final   Special Requests   Final    BOTTLES DRAWN AEROBIC ONLY Blood Culture adequate volume   Culture   Final    NO GROWTH 5 DAYS Performed at [facility], [place] [address], , [place] [address]    Report Status 06/22/2021 FINAL  Final         Radiology Studies: No results found.      Scheduled Meds:  atorvastatin  40 mg Oral QHS   Dextromethorphan-quiNIDine  1 capsule Oral BID   divalproex  125 mg Oral Q12H   propranolol  10 mg Oral TID   QUEtiapine  50 mg Oral TID   rivaroxaban  20 mg Oral Q supper   tamsulosin  0.4 mg Oral QPC supper   Continuous Infusions:  sodium chloride  LOS: 17 days    Time spent: 25 minutes    Edwin Dada, MD [facility] 06/26/2021, 9:26 AM     Please page though [place] or Epic secure chat:  For [employer], Adult nurse

## 2021-06-27 DIAGNOSIS — F0391 Unspecified dementia with behavioral disturbance: Secondary | ICD-10-CM | POA: Diagnosis not present

## 2021-06-27 MED ORDER — DIVALPROEX SODIUM 125 MG PO CSDR
250.0000 mg | DELAYED_RELEASE_CAPSULE | Freq: Two times a day (BID) | ORAL | Status: DC
  Administered 2021-06-27 – 2021-07-04 (×14): 250 mg via ORAL
  Filled 2021-06-27 (×14): qty 2

## 2021-06-27 NOTE — Progress Notes (Signed)
Physical Therapy Treatment Patient Details Name: [name] MRN: [id number] DOB: 1940-12-15 Today's Date: 06/27/2021    History of Present Illness Pt is 80 y.o. female admitted with acute onset Rt sided weakness and aphasia on 06/09/21.  CTA showed acute L M2 oacclusion > thrombectomy.  MRI showed:  Multifocal acute ischemia within the right internal capsule, left frontal white matter and left insula. 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  PMH includes:  cat bite with cellulitis Lt Lower leg, HTN, h/o CVAs, mild dementia    PT Comments    Pt very confused and with limited participation today.  Attempted to transition pt to EOB, but she was completely disoriented to position and felt that she was falling so resisted any effort to move to EOB.  Attempted multiple methods to transition and orient to position, but unable. Returned to bed and positioned with HOB elevated to 35 degrees - pt resting comfortably and quietly .    Follow Up Recommendations  Supervision/Assistance - 24 hour;SNF     Equipment Recommendations  Rolling walker with 5" wheels;3in1 (PT);Wheelchair (measurements PT);Wheelchair cushion (measurements PT)    Recommendations for Other Services       Precautions / Restrictions Precautions Precautions: Fall    Mobility  Bed Mobility Overal bed mobility: Needs Assistance             General bed mobility comments: Attempted supine to sit in multiple methods but unable due to pt resisting and pulling back.  Attempted by raising HOB and coming to EOB.  Pt very disoriented to position and kept scooting toward foot of bed to get feet on foot board.  Tried to come of end of bed by removing foot board but pt still resisting and fearful of falling.  Tried to get pt to calm and focus on therapist to reorient but she was unable.  Tried putting feet on floor and have her feel floor, but did not improve orientation. Unsafe to continue to attempt due to pt just keeps scooting  but toward edge of bed.  Had to [place] bed and slide pt back up. Replaced foot board    Transfers                    Ambulation/Gait                 Stairs             Wheelchair Mobility    Modified Rankin (Stroke Patients Only) Modified Rankin (Stroke Patients Only) Pre-Morbid Rankin Score: No symptoms Modified Rankin: Severe disability     Balance                                            Cognition Arousal/Alertness: Awake/alert Behavior During Therapy: Anxious Overall Cognitive Status: Impaired/Different from baseline Area of Impairment: Orientation;Attention;Memory;Following commands;Safety/judgement;Awareness;Problem solving                 Orientation Level: Disoriented to;Place;Time;Situation Current Attention Level: Focused Memory: Decreased short-term memory Following Commands: Follows one step commands inconsistently Safety/Judgement: Decreased awareness of safety;Decreased awareness of deficits Awareness: Intellectual Problem Solving: Decreased initiation;Requires verbal cues;Difficulty sequencing;Requires tactile cues;Slow processing General Comments: Pt yelling "help me" continually prior to session (nursing had tried to address but pt confused).  Pt kept trying to scoot down in bed to get feet on foot board stating  it was the floor.  She was completely disoriented to even position and felt that she was falling and laying on floor. Pt not following commands and unable to redirect.  At end of session, pt with sudden change to calm and sleeping.      Exercises      General Comments        Pertinent Vitals/Pain Pain Assessment: No/denies pain    Home Living                      Prior Function            PT Goals (current goals can now be found in the care plan section) Progress towards PT goals: Not progressing toward goals - comment (limited today due to confusion)    Frequency     Min 3X/week      PT Plan Current plan remains appropriate    Co-evaluation PT/OT/SLP Co-Evaluation/Treatment: Yes Reason for Co-Treatment: For patient/therapist safety PT goals addressed during session: Mobility/safety with mobility        AM-PAC PT "6 Clicks" Mobility   Outcome Measure  Help needed turning from your back to your side while in a flat bed without using bedrails?: Total Help needed moving from lying on your back to sitting on the side of a flat bed without using bedrails?: Total Help needed moving to and from a bed to a chair (including a wheelchair)?: Total Help needed standing up from a chair using your arms (e.g., wheelchair or bedside chair)?: Total Help needed to walk in hospital room?: Total Help needed climbing 3-5 steps with a railing? : Total 6 Click Score: 6    End of Session   Activity Tolerance: Other (comment) (Pt limited due to confusion) Patient left: with call bell/phone within reach;in bed;with bed alarm set Nurse Communication: Mobility status (limited participation today) PT Visit Diagnosis: Unsteadiness on feet (R26.81);Other abnormalities of gait and mobility (R26.89);Difficulty in walking, not elsewhere classified (R26.2);Other symptoms and signs involving the nervous system (R29.898)     Time: [id number] PT Time Calculation (min) (ACUTE ONLY): 9 min  Charges:  $Therapeutic Activity: 8-22 mins                     Abran Richard, PT Acute Rehab Services Pager [phone] [facility] [place] 06/27/2021, 12:55 PM

## 2021-06-27 NOTE — Plan of Care (Signed)
  Problem: Education: Goal: Knowledge of disease or condition will improve Outcome: Not Progressing Goal: Knowledge of secondary prevention will improve Outcome: Not Progressing Goal: Knowledge of patient specific risk factors addressed and post discharge goals established will improve Outcome: Not Progressing Goal: Individualized Educational Video(s) Outcome: Not Progressing   Problem: Coping: Goal: Will identify appropriate support needs Outcome: Not Progressing   Problem: Health Behavior/Discharge Planning: Goal: Ability to manage health-related needs will improve Outcome: Not Progressing   Problem: Self-Care: Goal: Ability to participate in self-care as condition permits will improve Outcome: Not Progressing Goal: Verbalization of feelings and concerns over difficulty with self-care will improve Outcome: Not Progressing Goal: Ability to communicate needs accurately will improve Outcome: Not Progressing   Problem: Nutrition: Goal: Risk of aspiration will decrease Outcome: Not Progressing Goal: Dietary intake will improve Outcome: Not Progressing   Problem: Ischemic Stroke/TIA Tissue Perfusion: Goal: Complications of ischemic stroke/TIA will be minimized Outcome: Not Progressing   Problem: Education: Goal: Knowledge of General Education information will improve Description: Including pain rating scale, medication(s)/side effects and non-pharmacologic comfort measures Outcome: Not Progressing   Problem: Health Behavior/Discharge Planning: Goal: Ability to manage health-related needs will improve Outcome: Not Progressing   Problem: Clinical Measurements: Goal: Ability to maintain clinical measurements within normal limits will improve Outcome: Not Progressing Goal: Will remain free from infection Outcome: Not Progressing Goal: Diagnostic test results will improve Outcome: Not Progressing Goal: Respiratory complications will improve Outcome: Not Progressing Goal:  Cardiovascular complication will be avoided Outcome: Not Progressing   Problem: Activity: Goal: Risk for activity intolerance will decrease Outcome: Not Progressing   Problem: Nutrition: Goal: Adequate nutrition will be maintained Outcome: Not Progressing   Problem: Coping: Goal: Level of anxiety will decrease Outcome: Not Progressing   Problem: Pain Managment: Goal: General experience of comfort will improve Outcome: Not Progressing   Problem: Safety: Goal: Ability to remain free from injury will improve Outcome: Not Progressing   Problem: Skin Integrity: Goal: Risk for impaired skin integrity will decrease Outcome: Not Progressing

## 2021-06-27 NOTE — Progress Notes (Signed)
[facility] PROGRESS NOTE    [name]  [id number] DOB: June 25, 1941 DOA: 06/09/2021 PCP: [name], DO      Brief Narrative:  [name] is a 80 y.o. F with A. fib on Xarelto, dementia who presented with acute aphasia and right-sided weakness.  In the ER, CT angiogram showed a left M2 occlusion and she underwent mechanical thrombectomy with TICI 3 revascularization.          Assessment & Plan:  Acute ischemic stroke -  Continue atorvastatin, Xarelto  Admitted with aphasia and right-sided weakness.  MRI showed multifocal acute ischemia in the right internal capsule, left frontal white matter and left insula.  CT angiography showed emergent left M2 branch occlusion with penumbra  To the Cath Lab immediately for endovascular revascularization by Dr. Estanislado Pandy  -Echocardiogram showed no cardiogenic source of embolism -Carotid imaging unremarkable   -Lipids ordered    -Atrial fibrillation: previouslly known -Dysphagia screen ordered in ER -PT eval ordered: recommended SNF -Smoking cessation: not pertinent   Delirium superimposed on dementia This was an issue at her previous hospitalization, but resolved fairly well with propranolol and Nuedexta - Continue Nuedexta, propranolol - Continue quetiapine which is new, will avoid further up titration of this medication given history of neuroleptic malignant syndrome. Increase Depakote dose.  Chronic atrial fibrillation Rates controlled - Continue Xarelto, propranolol  History of neuroleptic malignant syndrome Avoid neuroleptics  Hematuria No hematuria observed. -Follow-up with urology as an outpatient  Hypertension Blood pressure controlled - Continue propranolol  Urinary retention, resolved Foley removed - Continue Flomax   Obesity BMI 33     Disposition: Status is: Inpatient  Remains inpatient appropriate because:Unsafe d/c plan  Dispo: The patient is from: Home               Anticipated d/c is to: SNF              Patient currently not stable given her ongoing agitation.   Difficult to place patient No       Level of care: Med-Surg       MDM: The below labs and imaging reports were reviewed and summarized above.  Medication management as above.    DVT prophylaxis: SCD's Start: 06/09/21 1143 rivaroxaban (XARELTO) tablet 20 mg  Code Status: Full code Family Communication:  No family at bedside.  Subjective: No complaints in the morning.  No nausea no vomiting.  Later in the afternoon trying to get out of the room in bed.  Objective: Vitals:   06/27/21 1215 06/27/21 1601 06/27/21 1810 06/27/21 1948  BP: 108/69 114/68  107/82  Pulse: (!) 45 (!) 47 (!) 57 (!) 49  Resp: 18   18  Temp: 98.3 F (36.8 C)   97.7 F (36.5 C)  TempSrc: Oral   Oral  SpO2: 95% 97%  99%  Weight:      Height:       No intake or output data in the 24 hours ending 06/27/21 2029 Filed Weights   06/12/21 0743  Weight: 92.3 kg    Examination: General: Appear in mild distress, no Rash; Oral Mucosa Clear, moist. no Abnormal Neck Mass Or lumps, Conjunctiva normal  Cardiovascular: S1 and S2 Present, no Murmur, Respiratory: good respiratory effort, Bilateral Air entry present and CTA, no Crackles, no wheezes Abdomen: Bowel Sound present, Soft and no tenderness Extremities: no Pedal edema Neurology: alert and oriented to time, place, and person affect appropriate. no new focal deficit Gait not  checked due to patient safety concerns    Data Reviewed: I have personally reviewed following labs and imaging studies:  CBC: No results for input(s): WBC, NEUTROABS, HGB, HCT, MCV, PLT in the last 168 hours.  Basic Metabolic Panel: No results for input(s): NA, K, CL, CO2, GLUCOSE, BUN, CREATININE, CALCIUM, MG, PHOS in the last 168 hours.  GFR: Estimated Creatinine Clearance: 63 mL/min (by C-G formula based on SCr of 0.79 mg/dL). Liver Function Tests: No results for  input(s): AST, ALT, ALKPHOS, BILITOT, PROT, ALBUMIN in the last 168 hours.  No results for input(s): LIPASE, AMYLASE in the last 168 hours. No results for input(s): AMMONIA in the last 168 hours. Coagulation Profile: No results for input(s): INR, PROTIME in the last 168 hours. Cardiac Enzymes: No results for input(s): CKTOTAL, CKMB, CKMBINDEX, TROPONINI in the last 168 hours.  BNP (last 3 results) No results for input(s): PROBNP in the last 8760 hours. HbA1C: No results for input(s): HGBA1C in the last 72 hours. CBG: No results for input(s): GLUCAP in the last 168 hours. Lipid Profile: No results for input(s): CHOL, HDL, LDLCALC, TRIG, CHOLHDL, LDLDIRECT in the last 72 hours. Thyroid Function Tests: No results for input(s): TSH, T4TOTAL, FREET4, T3FREE, THYROIDAB in the last 72 hours. Anemia Panel: No results for input(s): VITAMINB12, FOLATE, FERRITIN, TIBC, IRON, RETICCTPCT in the last 72 hours. Urine analysis:    Component Value Date/Time   COLORURINE AMBER (A) 03/31/2021 1702   APPEARANCEUR Clear 06/06/2021 1130   LABSPEC 1.040 (H) 03/31/2021 1702   PHURINE 5.0 03/31/2021 1702   GLUCOSEU Negative 06/06/2021 1130   HGBUR SMALL (A) 03/31/2021 1702   BILIRUBINUR Negative 06/06/2021 1130   KETONESUR 5 (A) 03/31/2021 1702   PROTEINUR Negative 06/06/2021 1130   PROTEINUR 100 (A) 03/31/2021 1702   NITRITE Negative 06/06/2021 1130   NITRITE NEGATIVE 03/31/2021 1702   LEUKOCYTESUR 1+ (A) 06/06/2021 1130   LEUKOCYTESUR SMALL (A) 03/31/2021 1702   Sepsis Labs: '@LABRCNTIP'$ (procalcitonin:4,lacticacidven:4)  ) No results found for this or any previous visit (from the past 240 hour(s)).        Radiology Studies: No results found.      Scheduled Meds:  atorvastatin  40 mg Oral QHS   Dextromethorphan-quiNIDine  1 capsule Oral BID   divalproex  250 mg Oral Q12H   propranolol  10 mg Oral TID   QUEtiapine  50 mg Oral TID   rivaroxaban  20 mg Oral Q supper   tamsulosin   0.4 mg Oral QPC supper   Continuous Infusions:  sodium chloride       LOS: 18 days    Time spent: 25 minutes    Berle Mull, MD [facility] 06/27/2021, 8:29 PM     Please page though AMION or Epic secure chat:  For [employer], Adult nurse

## 2021-06-27 NOTE — Progress Notes (Signed)
Occupational Therapy Treatment Patient Details Name: ZALEYAH ROOKSTOOL MRN: [id number] DOB: [birth year] Today's Date: 06/27/2021    History of present illness Pt is 80 y.o. female admitted with acute onset Rt sided weakness and aphasia on 06/09/21.  CTA showed acute L M2 oacclusion > thrombectomy.  MRI showed:  Multifocal acute ischemia within the right internal capsule, left frontal white matter and left insula. 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  PMH includes:  cat bite with cellulitis Lt Lower leg, HTN, h/o CVAs, mild dementia   OT comments  Upon arrival, pt very confused and yelling out "help me". Pt in bed and continued to slide toward foot board, stating it was "the wall". Attempted to progress to EOB, but pt resisting any assistance and movement. Unsuccessfully attempted to re-orient pt to situation. Returned pt to safe position in bed, in fowlers position. Pt then calm and closed her eyes, resting comfortably. Pt will continue to benefit from skilled OT services to maximize safety and independence with ADL/IADL and functional mobility. Will continue to follow acutely and progress as tolerated.   Follow Up Recommendations  SNF    Equipment Recommendations  None recommended by OT    Recommendations for Other Services      Precautions / Restrictions Precautions Precautions: Fall Restrictions Weight Bearing Restrictions: No       Mobility Bed Mobility Overal bed mobility: Needs Assistance             General bed mobility comments: Attempted supine to sit in multiple methods but unable due to pt resisting and pulling back. Pt then proceeded to continue to slide forward. Attempted by raising HOB and coming to EOB.  Pt very disoriented to position and kept scooting toward foot of bed to get feet on foot board.  Tried to come of end of bed by removing foot board but pt still resisting and fearful of falling.  Tried to get pt to calm and focus on therapist to reorient but she was  unable.  Tried putting feet on floor and have her feel floor, but did not improve orientation. Unsafe to continue to attempt due to pt just keeps scooting but toward edge of bed.  Had to [place] bed and slide pt back up. Replaced foot board    Transfers                 General transfer comment: unable to assess    Balance                                           ADL either performed or assessed with clinical judgement   ADL Overall ADL's : Needs assistance/impaired                                       General ADL Comments: session significantly limited secondary to pt's cognitive status;unable to reorient pt;     Vision       Perception     Praxis      Cognition Arousal/Alertness: Awake/alert Behavior During Therapy: Anxious Overall Cognitive Status: Impaired/Different from baseline Area of Impairment: Orientation;Attention;Memory;Following commands;Safety/judgement;Awareness;Problem solving                 Orientation Level: Disoriented to;Place;Time;Situation Current Attention Level: Focused Memory: Decreased short-term memory Following  Commands: Follows one step commands inconsistently Safety/Judgement: Decreased awareness of safety;Decreased awareness of deficits Awareness: Intellectual Problem Solving: Decreased initiation;Requires verbal cues;Difficulty sequencing;Requires tactile cues;Slow processing General Comments: Pt yelling "help me" continually prior to session (nursing had tried to address but pt confused).  Pt kept trying to scoot down in bed to get feet on foot board stating it was the floor.  She was completely disoriented to even position and felt that she was falling and laying on floor. Pt not following commands and unable to redirect.  At end of session, pt with sudden change to calm and sleeping.        Exercises     Shoulder Instructions       General Comments      Pertinent Vitals/  Pain       Pain Assessment: Faces Faces Pain Scale: Hurts a little bit Pain Location: pt endorses pain in her hands Pain Intervention(s): Monitored during session;Limited activity within patient's tolerance  Home Living                                          Prior Functioning/Environment              Frequency  Min 2X/week        Progress Toward Goals  OT Goals(current goals can now be found in the care plan section)  Progress towards OT goals: Not progressing toward goals - comment (limited by agitation and cognition)  Acute Rehab OT Goals Patient Stated Goal: pt did not state OT Goal Formulation: Patient unable to participate in goal setting Time For Goal Achievement: 07/11/21 Potential to Achieve Goals: Fair ADL Goals Pt Will Perform Eating: with set-up;with supervision;sitting Pt Will Perform Grooming: with set-up;sitting Pt Will Perform Upper Body Bathing: with min assist;sitting Pt Will Perform Lower Body Bathing: with min assist;sit to/from stand Pt Will Perform Upper Body Dressing: with min assist;sitting Pt Will Perform Lower Body Dressing: with min assist;sit to/from stand Pt Will Transfer to Toilet: with min guard assist;ambulating Pt Will Perform Toileting - Clothing Manipulation and hygiene: with min guard assist;sit to/from stand  Plan Discharge plan remains appropriate    Co-evaluation    PT/OT/SLP Co-Evaluation/Treatment: Yes Reason for Co-Treatment: Complexity of the patient's impairments (multi-system involvement);For patient/therapist safety;To address functional/ADL transfers PT goals addressed during session: Mobility/safety with mobility OT goals addressed during session: ADL's and self-care      AM-PAC OT "6 Clicks" Daily Activity     Outcome Measure   Help from another person eating meals?: A Lot Help from another person taking care of personal grooming?: A Lot Help from another person toileting, which includes using  toliet, bedpan, or urinal?: A Lot Help from another person bathing (including washing, rinsing, drying)?: A Lot Help from another person to put on and taking off regular upper body clothing?: A Lot Help from another person to put on and taking off regular lower body clothing?: A Lot 6 Click Score: 12    End of Session    OT Visit Diagnosis: Unsteadiness on feet (R26.81);Cognitive communication deficit (R41.841) Symptoms and signs involving cognitive functions: Cerebral infarction   Activity Tolerance Treatment limited secondary to agitation   Patient Left in bed;with bed alarm set;with call bell/phone within reach   Nurse Communication Mobility status        Time: [id number] OT Time Calculation (min): 10 min  Charges: OT  General Charges $OT Visit: 1 Visit OT Treatments $Self Care/Home Management : 8-22 mins  Helene Kelp OTR/L Acute Rehabilitation Services Office: [place] 06/27/2021, 2:59 PM

## 2021-06-28 DIAGNOSIS — F0391 Unspecified dementia with behavioral disturbance: Secondary | ICD-10-CM | POA: Diagnosis not present

## 2021-06-28 LAB — BASIC METABOLIC PANEL
Anion gap: 7 (ref 5–15)
BUN: 18 mg/dL (ref 8–23)
CO2: 24 mmol/L (ref 22–32)
Calcium: 8.8 mg/dL — ABNORMAL LOW (ref 8.9–10.3)
Chloride: 108 mmol/L (ref 98–111)
Creatinine, Ser: 0.66 mg/dL (ref 0.44–1.00)
GFR, Estimated: >60 mL/min (ref >60)
Glucose, Bld: 93 mg/dL (ref 70–99)
Potassium: 3.9 mmol/L (ref 3.5–5.1)
Sodium: 139 mmol/L (ref 135–145)

## 2021-06-28 LAB — CBC
HCT: 40.5 % (ref 36.0–46.0)
Hemoglobin: 12.9 g/dL (ref 12.0–15.0)
MCH: 29.3 pg (ref 26.0–34.0)
MCHC: 31.9 g/dL (ref 30.0–36.0)
MCV: 92 fL (ref 80.0–100.0)
Platelets: 453 10*3/uL — ABNORMAL HIGH (ref 150–400)
RBC: 4.4 MIL/uL (ref 3.87–5.11)
RDW: 14.2 % (ref 11.5–15.5)
WBC: 5.2 10*3/uL (ref 4.0–10.5)
nRBC: 0 % (ref 0.0–0.2)

## 2021-06-28 NOTE — TOC Progression Note (Signed)
Transition of Care [facility]) - Progression Note    Patient Details  Name: [name] MRN: [id number] Date of Birth: February 07, 1941  Transition of Care [facility]) CM/SW [place], [place] Phone Number: 06/28/2021, 4:01 PM  Clinical Narrative:   CSW spoke with MD earlier today about barriers to discharge, and MD plans to change some medications to try to decrease patient's agitation. CSW spoke with daughter in law to discuss, and how CSW will reach out to [facility] tomorrow for assistance with the patient's placement as she continues to have no bed offers. Daughter in law was appreciative of update and understanding about barriers to discharge. Daughter in law discussed how the patient does much better out of the hospital and knows that she'll be more cooperative once she's in the rehab setting, it's just getting her to that point. CSW to continue to follow.    Expected Discharge Plan: Skilled Nursing Facility Barriers to Discharge: Continued Medical Work up, [place] will not accept until restraint criteria met  Expected Discharge Plan and Services Expected Discharge Plan: [place]   Discharge Planning Services: CM Consult   Living arrangements for the past 2 months: Single Family Home                                       Social Determinants of Health (SDOH) Interventions    Readmission Risk Interventions No flowsheet data found.

## 2021-06-28 NOTE — TOC Progression Note (Signed)
Transition of Care [facility]) - Progression Note    Patient Details  Name: [name] MRN: [id number] Date of Birth: October 29, 1941  Transition of Care [facility]) CM/SW [place], [place] Phone Number: 06/28/2021, 10:50 AM  Clinical Narrative:   CSW following for discharge to SNF. Patient continues to have no bed offers at this time. CSW alerted by RN that patient was increasingly agitated today and had to be put back in restraints. CSW received a call from daughter in law to discuss, and asked about other options. CSW to discuss with [facility] Supervisor about how to move forward with SNF placement for patient, as there are no bed offers for the patient. CSW to follow.    Expected Discharge Plan: Skilled Nursing Facility Barriers to Discharge: Continued Medical Work up, [place] will not accept until restraint criteria met  Expected Discharge Plan and Services Expected Discharge Plan: [place]   Discharge Planning Services: CM Consult   Living arrangements for the past 2 months: Single Family Home                                       Social Determinants of Health (SDOH) Interventions    Readmission Risk Interventions No flowsheet data found.

## 2021-06-28 NOTE — Plan of Care (Signed)
  Problem: Education: Goal: Knowledge of disease or condition will improve Outcome: Not Progressing Goal: Knowledge of secondary prevention will improve Outcome: Not Progressing Goal: Knowledge of patient specific risk factors addressed and post discharge goals established will improve Outcome: Not Progressing Goal: Individualized Educational Video(s) Outcome: Not Progressing   Problem: Coping: Goal: Will identify appropriate support needs Outcome: Not Progressing   Problem: Health Behavior/Discharge Planning: Goal: Ability to manage health-related needs will improve Outcome: Not Progressing   Problem: Self-Care: Goal: Ability to participate in self-care as condition permits will improve Outcome: Not Progressing Goal: Verbalization of feelings and concerns over difficulty with self-care will improve Outcome: Not Progressing Goal: Ability to communicate needs accurately will improve Outcome: Not Progressing   Problem: Nutrition: Goal: Risk of aspiration will decrease Outcome: Not Progressing Goal: Dietary intake will improve Outcome: Not Progressing   Problem: Ischemic Stroke/TIA Tissue Perfusion: Goal: Complications of ischemic stroke/TIA will be minimized Outcome: Not Progressing   Problem: Education: Goal: Knowledge of General Education information will improve Description: Including pain rating scale, medication(s)/side effects and non-pharmacologic comfort measures Outcome: Not Progressing   Problem: Health Behavior/Discharge Planning: Goal: Ability to manage health-related needs will improve Outcome: Not Progressing   Problem: Clinical Measurements: Goal: Ability to maintain clinical measurements within normal limits will improve Outcome: Not Progressing Goal: Will remain free from infection Outcome: Not Progressing Goal: Diagnostic test results will improve Outcome: Not Progressing Goal: Respiratory complications will improve Outcome: Not Progressing Goal:  Cardiovascular complication will be avoided Outcome: Not Progressing   Problem: Activity: Goal: Risk for activity intolerance will decrease Outcome: Not Progressing   Problem: Nutrition: Goal: Adequate nutrition will be maintained Outcome: Not Progressing   Problem: Coping: Goal: Level of anxiety will decrease Outcome: Not Progressing   Problem: Pain Managment: Goal: General experience of comfort will improve Outcome: Not Progressing

## 2021-06-28 NOTE — Progress Notes (Signed)
[facility] Progress Note  Patient: [name]    [id number]  DOA: 06/09/2021     Date of Service: the patient was seen and examined on 06/28/2021  Brief hospital course: Past medical history of A. fib on Xarelto, dementia.  Presents with complaints of aphasia and right-sided weakness.  Found to have left M2 occlusion acute stroke. Now appears to have agitation secondary to delirium.  8/6-8/7 CT with hyperdense left M2 branch, MRI multiple acute ischemia.  Taken to Cath Lab SP endovascular revascularization of left MCA 8/16 psychiatry consulted due to ongoing confusion and delirium. Currently plan is to treat delirium.  Subjective: Remains agitated intermittently.  No acute events.  No nausea no vomiting.  Able to stay without restriction throughout the day.  Assessment and Plan: 1.  Acute right ICA, left insular and left frontal infarct embolic pattern Secondary to embolization due to A. fib Patient was off of Xarelto due to hematuria. SP intervention by neuro IR for revascularization of left MCA. Echocardiogram shows 65 to 70% EF. LDL 30 prevention hemoglobin A1c 5.3. Resumed on Xarelto. On Lipitor 80 mg as well.  2.  Dementia with delirium and agitation History of neuroleptic malignant syndrome May 2022 Does not have official diagnosis but suspect based on the imaging that patient is suffering from advanced dementia. Ongoing sundowning issues. Patient was started on Depakote and Seroquel.  Also continue Nuedexta and propranolol. Dose gradually uptitrated due to ongoing agitation. Patient has history of neuroleptic malignant syndrome in May 22 and therefore will be avoiding further up titration of Seroquel. Continue to monitor.  3.  Chronic A. fib HTN Currently rate controlled. Resumed on Xarelto. On Inderal.  Blood pressure stable.  Monitor. Monitor.  4.  Hematuria Patient has a hematuria for which Xarelto was on hold. Currently no hematuria here in the  hospital. Monitor.  5.  HLD LDL 30.  On Lipitor 40.  Continue.  6.  Intermittent urinary retention. Currently resolved. Monitor. Continue Flomax.  7.  Obesity. Placing the patient at high risk of poor outcome.  Scheduled Meds:  atorvastatin  40 mg Oral QHS   Dextromethorphan-quiNIDine  1 capsule Oral BID   divalproex  250 mg Oral Q12H   propranolol  10 mg Oral TID   QUEtiapine  50 mg Oral TID   rivaroxaban  20 mg Oral Q supper   tamsulosin  0.4 mg Oral QPC supper   Continuous Infusions:  sodium chloride     PRN Meds: acetaminophen **OR** acetaminophen (TYLENOL) oral liquid 160 mg/5 mL **OR** acetaminophen, senna-docusate  Body mass index is 33.86 kg/m.        DVT Prophylaxis:   SCD's Start: 06/09/21 1143 rivaroxaban (XARELTO) tablet 20 mg    Advance goals of care discussion: Pt is Full code.  Family Communication: no family was present at bedside, at the time of interview.   Data Reviewed: I have personally reviewed and interpreted daily labs, tele strips, imaging. Sodium 139, electrolytes stable.  Serum creatinine stable.  CBC stable.  Physical Exam:  General: Appear in mild distress, no Rash; Oral Mucosa Clear, moist. no Abnormal Neck Mass Or lumps, Conjunctiva normal  Cardiovascular: S1 and S2 Present, no Murmur, Respiratory: good respiratory effort, Bilateral Air entry present and CTA, no Crackles, no wheezes Abdomen: Bowel Sound present, Soft and no tenderness Extremities: no Pedal edema Neurology: alert and oriented to time, place, and person affect flat in affect. no new focal deficit Gait not checked due to patient safety concerns  Vitals:   06/28/21 0816 06/28/21 0947 06/28/21 1148 06/28/21 1612  BP: (!) 168/88 (!) 126/115 105/72 128/69  Pulse: 80 74 69 (!) 55  Resp: '18  16 18  '$ Temp: 98.7 F (37.1 C)  98.1 F (36.7 C) 98.2 F (36.8 C)  TempSrc: Oral  Axillary Oral  SpO2: 97%  98% 99%  Weight:      Height:        Disposition:  Status is:  Inpatient  Remains inpatient appropriate because:Altered mental status  Dispo: The patient is from: Home              Anticipated d/c is to: SNF              Patient currently is not medically stable to d/c.   Difficult to place patient No        Time spent: 35 minutes. I reviewed all nursing notes, pharmacy notes, vitals, pertinent old records. I have discussed plan of care as described above with RN.  Author: Berle Mull, MD [facility] 06/28/2021 7:02 PM  To reach On-call, see care teams to locate the attending and reach out via [email or web address]. Between 7PM-7AM, please contact night-coverage If you still have difficulty reaching the attending provider, please page the [facility] (Director on Call) for [facility] on amion for assistance.

## 2021-06-28 NOTE — TOC Progression Note (Signed)
Transition of Care [facility]) - Progression Note    Patient Details  Name: [name] MRN: [id number] Date of Birth: 23-Sep-1941  Transition of Care [facility]) CM/SW [place], [place] Phone Number: 06/28/2021, 10:41 AM  Clinical Narrative:   CSW following for discharge to SNF. Patient continues to remain out of restraints, but no bed offers at this time. CSW received update from [facility] that they are unable to accept patient. Patient has received denials from all of the facilities in [place], as well. CSW faxed referral out further to try to locate bed offer for the patient. CSW to continue to follow.    Expected Discharge Plan: Skilled Nursing Facility Barriers to Discharge: [facility], [place] will not accept until restraint criteria met  Expected Discharge Plan and Services Expected Discharge Plan: [place]   Discharge Planning Services: CM Consult   Living arrangements for the past 2 months: Single Family Home                                       Social Determinants of Health (SDOH) Interventions    Readmission Risk Interventions No flowsheet data found.

## 2021-06-29 DIAGNOSIS — F0280 Dementia in other diseases classified elsewhere without behavioral disturbance: Secondary | ICD-10-CM

## 2021-06-29 DIAGNOSIS — R262 Difficulty in walking, not elsewhere classified: Secondary | ICD-10-CM | POA: Diagnosis not present

## 2021-06-29 DIAGNOSIS — F0391 Unspecified dementia with behavioral disturbance: Secondary | ICD-10-CM | POA: Diagnosis not present

## 2021-06-29 LAB — HEPATIC FUNCTION PANEL
ALT: 10 U/L (ref 0–44)
AST: 22 U/L (ref 15–41)
Albumin: 2.9 g/dL — ABNORMAL LOW (ref 3.5–5.0)
Alkaline Phosphatase: 68 U/L (ref 38–126)
Bilirubin, Direct: 0.3 mg/dL — ABNORMAL HIGH (ref 0.0–0.2)
Indirect Bilirubin: 0.4 mg/dL (ref 0.3–0.9)
Total Bilirubin: 0.7 mg/dL (ref 0.3–1.2)
Total Protein: 6.2 g/dL — ABNORMAL LOW (ref 6.5–8.1)

## 2021-06-29 LAB — VALPROIC ACID LEVEL: Valproic Acid Lvl: 37 ug/mL — ABNORMAL LOW (ref 50.0–100.0)

## 2021-06-29 LAB — AMMONIA: Ammonia: 22 umol/L (ref 9–35)

## 2021-06-29 MED ORDER — CLONAZEPAM 0.125 MG PO TBDP
0.1250 mg | ORAL_TABLET | Freq: Two times a day (BID) | ORAL | Status: DC
  Administered 2021-06-29 – 2021-07-04 (×11): 0.125 mg via ORAL
  Filled 2021-06-29 (×11): qty 1

## 2021-06-29 NOTE — TOC Progression Note (Addendum)
Transition of Care [facility]) - Progression Note    Patient Details  Name: [name] MRN: [id number] Date of Birth: Nov 04, 1941  Transition of Care [facility]) CM/SW [place], RN Phone Number: 06/29/2021, 2:36 PM  Clinical Narrative:    CM spoke with Tressa Busman, [facility] at [place] and the facility is going to review the patient for possible admission to the facility.  Therapy notes were refaxed to the facility and I updated them and clarified that the patient's medications were adjusted, patient was no requiring soft wrist restraints and more appropriate for rehabilitation at this time.  Patient's clinicals were refaxed in the hub to [facility], [place], [place], [place] and [facility] for [facility] placement.  I also left a text message with [facility], [facility], [place] and [facility] for clinical review for admission.   Expected Discharge Plan: Skilled Nursing Facility Barriers to Discharge: [facility], [place] will not accept until restraint criteria met  Expected Discharge Plan and Services Expected Discharge Plan: [place]   Discharge Planning Services: CM Consult   Living arrangements for the past 2 months: Single Family Home                                       Social Determinants of Health (SDOH) Interventions    Readmission Risk Interventions No flowsheet data found.

## 2021-06-29 NOTE — Progress Notes (Signed)
Physical Therapy Treatment Patient Details Name: [name] MRN: [id number] DOB: October 25, 1941 Today's Date: 06/29/2021    History of Present Illness Pt is 80 y.o. female admitted with acute onset Rt sided weakness and aphasia on 06/09/21.  CTA showed acute L M2 oacclusion > thrombectomy.  MRI showed:  Multifocal acute ischemia within the right internal capsule, left frontal white matter and left insula. 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  PMH includes:  cat bite with cellulitis Lt Lower leg, HTN, h/o CVAs, mild dementia    PT Comments    Patient continues to be limited by impaired cognition. Patient requires minA for safety for OOB mobility. Patient difficult to redirect and requires max encouragement to perform mobility. Patient easily agitated with cues or directions. Continue to recommend SNF for ongoing Physical Therapy.       Follow Up Recommendations  Supervision/Assistance - 24 hour;SNF     Equipment Recommendations  Rolling Michell Giuliano with 5" wheels;3in1 (PT);Wheelchair (measurements PT);Wheelchair cushion (measurements PT)    Recommendations for Other Services       Precautions / Restrictions Precautions Precautions: Fall Precaution Comments: agitation Restrictions Weight Bearing Restrictions: No    Mobility  Bed Mobility Overal bed mobility: Needs Assistance Bed Mobility: Supine to Sit     Supine to sit: Min assist     General bed mobility comments: minA for trunk elevation and max encouragement    Transfers Overall transfer level: Needs assistance Equipment used: Rolling Genie Mirabal (2 wheeled) Transfers: Sit to/from Stand Sit to Stand: Mod assist         General transfer comment: modA to rise and steady  Ambulation/Gait Ambulation/Gait assistance: Min assist Gait Distance (Feet): 20 Feet (x5') Assistive device: Rolling Amiee Wiley (2 wheeled);None Gait Pattern/deviations: Decreased stride length;Trunk flexed;Step-through pattern;Shuffle Gait velocity:  decreased   General Gait Details: Using RW at first for 20' with poor awareness of body to RW. On return to room, patient leaves RW to side and ambulates within room with no AD and reaching for objects. Patient not receptive to cues or instructions. Patient becomes agitated with cueing   Stairs             Wheelchair Mobility    Modified Rankin (Stroke Patients Only) Modified Rankin (Stroke Patients Only) Pre-Morbid Rankin Score: No symptoms Modified Rankin: Moderately severe disability     Balance Overall balance assessment: Needs assistance Sitting-balance support: Feet supported;No upper extremity supported Sitting balance-[name]: Fair     Standing balance support: No upper extremity supported;During functional activity Standing balance-[name]: Poor Standing balance comment: min A needed for safety in standing due to decreased patient awareness of safety deficits                            Cognition Arousal/Alertness: Awake/alert Behavior During Therapy: Agitated Overall Cognitive Status: Impaired/Different from baseline Area of Impairment: Orientation;Attention;Memory;Following commands;Safety/judgement;Awareness;Problem solving                 Orientation Level: Disoriented to;Place;Time;Situation Current Attention Level: Focused Memory: Decreased short-term memory Following Commands: Follows one step commands inconsistently Safety/Judgement: Decreased awareness of safety;Decreased awareness of deficits Awareness: Intellectual Problem Solving: Decreased initiation;Requires verbal cues;Difficulty sequencing;Requires tactile cues;Slow processing General Comments: difficult to redirect. Agreeable with therapy with use of food as reward. Not following commands      Exercises      General Comments        Pertinent Vitals/Pain Pain Assessment: Faces Faces Pain  Scale: No hurt Pain Intervention(s): Monitored during session    Home  Living                      Prior Function            PT Goals (current goals can now be found in the care plan section) Acute Rehab PT Goals Patient Stated Goal: pt did not state PT Goal Formulation: Patient unable to participate in goal setting Time For Goal Achievement: 07/08/21 Potential to Achieve Goals: Good Progress towards PT goals: Progressing toward goals    Frequency    Min 2X/week      PT Plan Frequency needs to be updated    Co-evaluation              AM-PAC PT "6 Clicks" Mobility   Outcome Measure  Help needed turning from your back to your side while in a flat bed without using bedrails?: A Little Help needed moving from lying on your back to sitting on the side of a flat bed without using bedrails?: A Little Help needed moving to and from a bed to a chair (including a wheelchair)?: A Little Help needed standing up from a chair using your arms (e.g., wheelchair or bedside chair)?: A Little Help needed to walk in hospital room?: A Little Help needed climbing 3-5 steps with a railing? : A Lot 6 Click Score: 17    End of Session Equipment Utilized During Treatment: Gait belt Activity Tolerance: Patient tolerated treatment well Patient left:  (seated EOB with NT present) Nurse Communication: Mobility status PT Visit Diagnosis: Unsteadiness on feet (R26.81);Other abnormalities of gait and mobility (R26.89);Difficulty in walking, not elsewhere classified (R26.2);Other symptoms and signs involving the nervous system [id number])     Time: [id number] PT Time Calculation (min) (ACUTE ONLY): 24 min  Charges:  $Gait Training: 8-22 mins $Therapeutic Activity: 8-22 mins                     Ramelo Oetken A. Gilford Rile PT, DPT Acute Rehabilitation Services Pager [phone] Office [phone]    [name] 06/29/2021, 5:23 PM

## 2021-06-29 NOTE — Progress Notes (Signed)
  Speech Language Pathology Treatment: Cognitive-Linquistic  Patient Details Name: [name] MRN: [id number] DOB: April 19, 1941 Today's Date: 06/29/2021 Time: [id number] SLP Time Calculation (min) (ACUTE ONLY): 23 min  Assessment / Plan / Recommendation Clinical Impression  Pt seen for cog/linguistic tx with naming confrontational items 50% accurate with mod semantic/phonemic cues given paired with sentence completion.  Pt able to state name and place with F:2 choices, but unable to determine other orientation questions.  Pt read simple information (ie: environmental signs) given min visual cues with 80% accuracy.  1-step commands 85% accurate with min cues with perseveration noted.  2-step commands unable to be completed.  Pt's demeanor much calmer than prior tx session with this SLP with mitts not utilized during session.  Pt encouraged to utilize environmental signs for orientation purposes, but pt's memory deficits impacting ability to follow through with implementation, so visual reminders may be helpful placed on pt tray table or in the room.  Family unavailable to educate re: pt's cog/linguistic needs.  Pt with continued cognitive decline with decreased awareness of deficits and safety awareness/deconditioning impacting her ability to return to PLOF/situation. Consider memory care facility for safety of pt/recent decline recommended if family/tx team agrees.  ST will continue tx efforts while in acute setting.  HPI HPI: [name] is a 80 y.o. female with PMH significant for A. fib, recently taken off Xarelto for gross hematuria, hypertension, dementia (sundowning and requiring 24 hour supervision), multiple prior ischemic stroke with most recent on 5/26 who presented to [facility] emergency department with acute onset aphasia and right-sided weakness. CTA was obtained with left MCA M2 occlusion with 51 cc penumbra. Patient was transferred to [facility] for thrombectomy.  MRI of the brain was  showing multifocal acute ischemia within the right internal capsule, left frontal white matter and left insula as well as old left occipital and frontal infarcts.  She was intubated briefly.  Per RN family reported onset of cognitive issues in May.  She was scheduled to see a neurologist.      [place] with current plan of care       Recommendations                   Follow up Recommendations: Skilled Nursing facility SLP Visit Diagnosis: Cognitive communication deficit [id number]) Plan: Continue with current plan of care                       [name], M.S., [place] 06/29/2021, 12:40 PM

## 2021-06-29 NOTE — Plan of Care (Signed)
  Problem: Education: Goal: Knowledge of disease or condition will improve Outcome: Progressing Goal: Knowledge of secondary prevention will improve Outcome: Progressing Goal: Knowledge of patient specific risk factors addressed and post discharge goals established will improve Outcome: Progressing Goal: Individualized Educational Video(s) Outcome: Progressing   Problem: Coping: Goal: Will identify appropriate support needs Outcome: Progressing   Problem: Ischemic Stroke/TIA Tissue Perfusion: Goal: Complications of ischemic stroke/TIA will be minimized Outcome: Progressing

## 2021-06-29 NOTE — Progress Notes (Addendum)
[facility] PROGRESS NOTE  [name] [id number] DOB: February 05, 1941 DOA: 06/09/2021 PCP: [name], DO  Status:  Remains inpatient appropriate because:Altered mental status and Unsafe d/c plan  Dispo: The patient is from: Home              Anticipated d/c is to: SNF              Patient currently is medically stable to d/c.   Difficult to place patient Yes      Level of care: Med-Surg  Code Status: Full Family Communication:  DVT prophylaxis: Xarelto COVID vaccination status: Unknown    HPI: 80 year old female with past medical history of A. fib on Xarelto, dementia.  Presented with report of aphasia and right-sided weakness.  Found to have left M2 occlusion acute stroke.  Hospital course has been complicated by acute delirium and agitation with suspected underlying dementia that was not diagnosed prior to admission.  8/6-8/7 CT with hyperdense left M2 branch, MRI multiple acute ischemia.  Taken to Cath Lab SP endovascular revascularization of left MCA.  Postprocedure she was briefly intubated and admitted to the ICU where PCCM assisted with ventilator management.  8/16 psychiatry consulted due to ongoing confusion and delirium. Currently plan is to treat delirium.  Treatment options somewhat limited by history of neuroleptic malignant syndrome.  Subjective: Entered room with staff trying to get patient repositioned in bed.  He had been sitting on side of the bed and they stood her to try to reposition her closer to the head of the bed.  Patient was very unsteady on her feet despite 2+ max assist and was very fearful she would fall.  Objective: Vitals:   06/29/21 0020 06/29/21 0405  BP: (!) 155/91 (!) 157/60  Pulse: (!) 58 (!) 53  Resp: 16 18  Temp: 97.8 F (36.6 C) 98 F (36.7 C)  SpO2: 100% 98%    Intake/Output Summary (Last 24 hours) at 06/29/2021 0744 Last data filed at 06/29/2021 0054 Gross per 24 hour  Intake 30 ml  Output 300 ml  Net -270 ml    Filed Weights   06/12/21 0743  Weight: 92.3 kg    Exam:  Constitutional: NAD, mildly anxious especially with attempts to stand.  Pleasantly confused. Respiratory: clear to auscultation bilaterally, no wheezing, no crackles. Normal respiratory effort. No accessory muscle use.  Room air Cardiovascular: Regular rate and rhythm, no murmurs / rubs / gallops. No extremity edema.  Mildly hypertensive Abdomen: no tenderness, no masses palpated. Bowel sounds positive. LBM 8/24 Neurologic: CN 2-12 grossly intact. Sensation intact, Strength 3/5 x all 4 extremities.  Notable disequilibrium even with 2+ assist while standing. Psychiatric: Alert.  Oriented to name only.  Very confused.  I introduced myself to her noting this is the first time I have taken care of her.  Her initial response was "yes I remember you from yesterday".   Assessment/Plan: Acute problems: Acute right ICA, left insular and left frontal infarct embolic pattern Secondary to embolization due to A. Fib (Patient was off of Xarelto due to hematuria). SP intervention by neuro IR for revascularization of left MCA. Echocardiogram shows 65 to 70% EF. LDL 30 prevention hemoglobin A1c 5.3. Continue Xarelto and max dose Lipitor   Dementia with delirium and agitation/ History of neuroleptic malignant syndrome May 2022 Does not have official diagnosis of dementia but is inconsistent with that diagnosis Ongoing sundowning issues. Continue Depakote, Seroquel, Nuedexta and propranolol.  Psychiatry assisted with medication selection and dosing.  Dose gradually uptitrated due to ongoing agitation. Patient has history of neuroleptic malignant syndrome in May 22 and therefore will be avoiding further up titration of Seroquel.  Physical deconditioning after CVA Patient having a difficult time with participating with PT secondary to confusion and sensation she was falling PT recommends SNF.   Chronic A. fib HTN Currently rate controlled on  Inderal Continue Xarelto for stroke prophylaxis Echocardiogram with hyperdynamic features with mild eccentric LVH without apparent diastolic dysfunction  Has significant biatrial dilatation   Hematuria Has not recurred during current hospitalization or with resumption of Xarelto   HLD Continue max dose Lipitor   Intermittent urinary retention. Resolved after initiation of Flomax   Obesity/Nutrition Placing the patient at high risk of poor outcome.  Data Reviewed: Basic Metabolic Panel: Recent Labs  Lab 06/28/21 0425  NA 139  K 3.9  CL 108  CO2 24  GLUCOSE 93  BUN 18  CREATININE 0.66  CALCIUM 8.8*   Liver Function Tests: Recent Labs  Lab 06/29/21 0047  AST 22  ALT 10  ALKPHOS 68  BILITOT 0.7  PROT 6.2*  ALBUMIN 2.9*   No results for input(s): LIPASE, AMYLASE in the last 168 hours. Recent Labs  Lab 06/29/21 0047  AMMONIA 22   CBC: Recent Labs  Lab 06/28/21 0425  WBC 5.2  HGB 12.9  HCT 40.5  MCV 92.0  PLT 453*   Cardiac Enzymes: No results for input(s): CKTOTAL, CKMB, CKMBINDEX, TROPONINI in the last 168 hours. BNP (last 3 results) No results for input(s): BNP in the last 8760 hours.  ProBNP (last 3 results) No results for input(s): PROBNP in the last 8760 hours.  CBG: No results for input(s): GLUCAP in the last 168 hours.  No results found for this or any previous visit (from the past 240 hour(s)).   Studies: No results found.  Scheduled Meds:  atorvastatin  40 mg Oral QHS   Dextromethorphan-quiNIDine  1 capsule Oral BID   divalproex  250 mg Oral Q12H   propranolol  10 mg Oral TID   QUEtiapine  50 mg Oral TID   rivaroxaban  20 mg Oral Q supper   tamsulosin  0.4 mg Oral QPC supper   Continuous Infusions:  sodium chloride      Active Problems:   Stroke ([place])   Acute ischemic left MCA stroke ([place])   Middle cerebral artery embolism, left   Agitation due to dementia [facility])   Consultants: Interventional  radiology Neurology Psychiatry PCCM  Procedures: Echocardiogram Percutaneous arterial thrombectomy  Antibiotics:    Time spent: 85 minutes    Erin Hearing ANP  [facility] 7 am - 330 pm/M-F for direct patient care and secure chat Please refer to Amion for contact info 20  days

## 2021-06-30 DIAGNOSIS — F0391 Unspecified dementia with behavioral disturbance: Secondary | ICD-10-CM | POA: Diagnosis not present

## 2021-06-30 LAB — CBC
HCT: 39.5 % (ref 36.0–46.0)
Hemoglobin: 12.7 g/dL (ref 12.0–15.0)
MCH: 29.7 pg (ref 26.0–34.0)
MCHC: 32.2 g/dL (ref 30.0–36.0)
MCV: 92.5 fL (ref 80.0–100.0)
Platelets: 556 10*3/uL — ABNORMAL HIGH (ref 150–400)
RBC: 4.27 MIL/uL (ref 3.87–5.11)
RDW: 14.5 % (ref 11.5–15.5)
WBC: 6.8 10*3/uL (ref 4.0–10.5)
nRBC: 0 % (ref 0.0–0.2)

## 2021-06-30 LAB — BASIC METABOLIC PANEL
Anion gap: 8 (ref 5–15)
BUN: 24 mg/dL — ABNORMAL HIGH (ref 8–23)
CO2: 27 mmol/L (ref 22–32)
Calcium: 9.2 mg/dL (ref 8.9–10.3)
Chloride: 108 mmol/L (ref 98–111)
Creatinine, Ser: 0.82 mg/dL (ref 0.44–1.00)
GFR, Estimated: >60 mL/min (ref >60)
Glucose, Bld: 104 mg/dL — ABNORMAL HIGH (ref 70–99)
Potassium: 4 mmol/L (ref 3.5–5.1)
Sodium: 143 mmol/L (ref 135–145)

## 2021-06-30 LAB — GLUCOSE, CAPILLARY: Glucose-Capillary: 92 mg/dL (ref 70–99)

## 2021-06-30 NOTE — Plan of Care (Signed)
  Problem: Education: Goal: Knowledge of disease or condition will improve Outcome: Progressing Goal: Knowledge of secondary prevention will improve Outcome: Progressing Goal: Knowledge of patient specific risk factors addressed and post discharge goals established will improve Outcome: Progressing Goal: Individualized Educational Video(s) Outcome: Progressing   Problem: Coping: Goal: Will identify appropriate support needs Outcome: Progressing   Problem: Ischemic Stroke/TIA Tissue Perfusion: Goal: Complications of ischemic stroke/TIA will be minimized Outcome: Progressing

## 2021-06-30 NOTE — Progress Notes (Signed)
[facility] PROGRESS NOTE  Patient: [name] [id number]   PCP: [name], DO DOB: 1940-11-23   DOA: 06/09/2021   DOS: 06/30/2021    Subjective: Remains confused and agitated.  No nausea no vomiting.  No acute events overnight.  Objective:  Vitals:   06/30/21 1325 06/30/21 1923  BP: 132/74 127/71  Pulse: (!) 59 (!) 50  Resp: 17 17  Temp: 97.9 F (36.6 C) 98.3 F (36.8 C)  SpO2: 96% 100%    S1-S2 present. Clear to auscultation. Bowel sound present.  Assessment and plan: Agitation. Ongoing issue. On Depakote Seroquel Propranolol. Psychiatry was consulted earlier as well. Continue current measures if no improvement, will change medication tomorrow.  Author: Berle Mull, MD [facility] 06/30/2021 7:24 PM   If 7PM-7AM, please contact night-coverage at [email or web address]

## 2021-07-01 DIAGNOSIS — F0391 Unspecified dementia with behavioral disturbance: Secondary | ICD-10-CM | POA: Diagnosis not present

## 2021-07-01 NOTE — Progress Notes (Signed)
[facility] PROGRESS NOTE  Patient: [name] [id number]   PCP: [name], DO DOB: [birth year]   DOA: 06/09/2021   DOS: 07/01/2021    Subjective: Remained without restraint throughout the day.  No nausea no vomiting.  Still remains confused though.  Oral intake improving.  Objective:  Vitals:   07/01/21 1113 07/01/21 1518  BP: 139/87 121/88  Pulse: 68 62  Resp: 16 17  Temp: 98.1 F (36.7 C) 98.2 F (36.8 C)  SpO2: 96% 95%    S1-S2 present.  Clear to auscultation.  No new focal deficit.  Follows on the lumbar  Assessment and plan: Agitation. Seems to be controlled with Klonopin. Will monitor for now.  Author: Berle Mull, MD [facility] 07/01/2021 6:45 PM   If 7PM-7AM, please contact night-coverage at [email or web address]

## 2021-07-02 DIAGNOSIS — R262 Difficulty in walking, not elsewhere classified: Secondary | ICD-10-CM | POA: Diagnosis not present

## 2021-07-02 DIAGNOSIS — F0391 Unspecified dementia with behavioral disturbance: Secondary | ICD-10-CM | POA: Diagnosis not present

## 2021-07-02 LAB — CBC
HCT: 41.1 % (ref 36.0–46.0)
Hemoglobin: 13.6 g/dL (ref 12.0–15.0)
MCH: 30.3 pg (ref 26.0–34.0)
MCHC: 33.1 g/dL (ref 30.0–36.0)
MCV: 91.5 fL (ref 80.0–100.0)
Platelets: 502 10*3/uL — ABNORMAL HIGH (ref 150–400)
RBC: 4.49 MIL/uL (ref 3.87–5.11)
RDW: 14.5 % (ref 11.5–15.5)
WBC: 8.6 10*3/uL (ref 4.0–10.5)
nRBC: 0 % (ref 0.0–0.2)

## 2021-07-02 LAB — BASIC METABOLIC PANEL
Anion gap: 8 (ref 5–15)
BUN: 28 mg/dL — ABNORMAL HIGH (ref 8–23)
CO2: 28 mmol/L (ref 22–32)
Calcium: 8.9 mg/dL (ref 8.9–10.3)
Chloride: 105 mmol/L (ref 98–111)
Creatinine, Ser: 0.82 mg/dL (ref 0.44–1.00)
GFR, Estimated: >60 mL/min (ref >60)
Glucose, Bld: 92 mg/dL (ref 70–99)
Potassium: 4.1 mmol/L (ref 3.5–5.1)
Sodium: 141 mmol/L (ref 135–145)

## 2021-07-02 MED ORDER — DEXTROMETHORPHAN-QUINIDINE 20-10 MG PO CAPS
1.0000 | ORAL_CAPSULE | Freq: Every day | ORAL | Status: DC
  Administered 2021-07-02 – 2021-07-03 (×2): 1 via ORAL
  Filled 2021-07-02 (×2): qty 1

## 2021-07-02 NOTE — Progress Notes (Signed)
[facility] PROGRESS NOTE  [name] [id number] DOB: 12/10/1940 DOA: 06/09/2021 PCP: [name], DO  Status:  Remains inpatient appropriate because:Altered mental status and Unsafe d/c plan  Dispo: The patient is from: Home              Anticipated d/c is to: SNF              Patient currently is medically stable to d/c.   Difficult to place patient Yes      Level of care: Med-Surg  Code Status: Full Family Communication:  DVT prophylaxis: Xarelto COVID vaccination status: Unknown    HPI: 80 year old female with past medical history of A. fib on Xarelto, dementia.  Presented with report of aphasia and right-sided weakness.  Found to have left M2 occlusion acute stroke.  Hospital course has been complicated by acute delirium and agitation with suspected underlying dementia that was not diagnosed prior to admission.  8/6-8/7 CT with hyperdense left M2 branch, MRI multiple acute ischemia.  Taken to Cath Lab SP endovascular revascularization of left MCA.  Postprocedure she was briefly intubated and admitted to the ICU where PCCM assisted with ventilator management.  8/16 psychiatry consulted due to ongoing confusion and delirium. Currently plan is to treat delirium.  Treatment options somewhat limited by history of neuroleptic malignant syndrome.  Subjective: Awakened.  No specific complaints when asked.  Objective: Vitals:   07/02/21 0030 07/02/21 0758  BP: 102/63 (!) 158/85  Pulse: (!) 53 62  Resp: 18 14  Temp: 98.7 F (37.1 C) 98 F (36.7 C)  SpO2: 97% 99%    Intake/Output Summary (Last 24 hours) at 07/02/2021 0827 Last data filed at 07/01/2021 1900 Gross per 24 hour  Intake 240 ml  Output 400 ml  Net -160 ml   Filed Weights   06/12/21 0743  Weight: 92.3 kg    Exam:  Constitutional: NAD, calm Respiratory: Stable on room air, no increased work of breathing when supine.  Clear to auscultation Cardiovascular: Regular rate and rhythm, no  murmurs / rubs / gallops.  Warm and dry. Abdomen: no tenderness, no masses palpated. Bowel sounds positive. LBM 8/24 Neurologic: CN 2-12 grossly intact. Sensation intact, Strength 3/5 x all 4 extremities.  Documented disequilibrium even with 2+ assist while standing. Psychiatric: Alert.  Oriented to name only.   Assessment/Plan: Acute problems: Acute right ICA, left insular and left frontal infarct embolic pattern Secondary to embolization due to A. Fib (Patient was off of Xarelto due to hematuria). SP intervention by neuro IR for revascularization of left MCA. Echocardiogram shows 65 to 70% EF. LDL 30 prevention hemoglobin A1c 5.3. Continue Xarelto and max dose Lipitor   Dementia with delirium and agitation/ History of neuroleptic malignant syndrome May 2022 Does not have official diagnosis of dementia but is inconsistent with that diagnosis Ongoing sundowning issues. Continue Depakote, Seroquel, and propranolol.  Psychiatry assisted with medication selection and dosing.   Of note, prior to admission patient was not taking her Nuedexta due to cost.  This medication was prescribed for pseudobulbar affect issues after stroke.  This medication is also on backorder and SNF will not be able to obtain.  I have been informed this medication cost $50 per pill as well.  Discussed with pharmacist and plan is to decrease to daily dosing today and administer for the next 3 days and then discontinue. If has increased behavioral issues after Nuedexta discontinued can increase frequency or dose of Depakote.  Like to avoid increasing  dose or frequency of benzodiazepine. Patient has history of neuroleptic malignant syndrome in May 22 and therefore will be avoiding further up titration of Seroquel.  Physical deconditioning after CVA Patient having a difficult time with participating with PT secondary to confusion and sensation she was falling PT recommends SNF.   Chronic A. fib HTN Currently rate  controlled on Inderal Continue Xarelto for stroke prophylaxis Echocardiogram with hyperdynamic features with mild eccentric LVH without apparent diastolic dysfunction  Has significant biatrial dilatation   Hematuria Has not recurred during current hospitalization or with resumption of Xarelto   HLD Continue max dose Lipitor   Intermittent urinary retention. Resolved after initiation of Flomax   Obesity/Nutrition Placing the patient at high risk of poor outcome.  Data Reviewed: Basic Metabolic Panel: Recent Labs  Lab 06/28/21 0425 06/30/21 0531 07/02/21 0710  NA 139 143 141  K 3.9 4.0 4.1  CL 108 108 105  CO2 '24 27 28  '$ GLUCOSE 93 104* 92  BUN 18 24* 28*  CREATININE 0.66 0.82 0.82  CALCIUM 8.8* 9.2 8.9   Liver Function Tests: Recent Labs  Lab 06/29/21 0047  AST 22  ALT 10  ALKPHOS 68  BILITOT 0.7  PROT 6.2*  ALBUMIN 2.9*   No results for input(s): LIPASE, AMYLASE in the last 168 hours. Recent Labs  Lab 06/29/21 0047  AMMONIA 22   CBC: Recent Labs  Lab 06/28/21 0425 06/30/21 0531 07/02/21 0710  WBC 5.2 6.8 8.6  HGB 12.9 12.7 13.6  HCT 40.5 39.5 41.1  MCV 92.0 92.5 91.5  PLT 453* 556* 502*   Cardiac Enzymes: No results for input(s): CKTOTAL, CKMB, CKMBINDEX, TROPONINI in the last 168 hours. BNP (last 3 results) No results for input(s): BNP in the last 8760 hours.  ProBNP (last 3 results) No results for input(s): PROBNP in the last 8760 hours.  CBG: Recent Labs  Lab 06/30/21 2130  GLUCAP 92    No results found for this or any previous visit (from the past 240 hour(s)).   Studies: No results found.  Scheduled Meds:  atorvastatin  40 mg Oral QHS   clonazepam  0.125 mg Oral BID   Dextromethorphan-quiNIDine  1 capsule Oral QHS   divalproex  250 mg Oral Q12H   propranolol  10 mg Oral TID   QUEtiapine  50 mg Oral TID   rivaroxaban  20 mg Oral Q supper   tamsulosin  0.4 mg Oral QPC supper   Continuous Infusions:  sodium chloride       Active Problems:   Stroke ([place])   Acute ischemic left MCA stroke ([place])   Middle cerebral artery embolism, left   Agitation due to dementia [facility])   Ambulatory dysfunction   Consultants: Interventional radiology Neurology Psychiatry PCCM  Procedures: Echocardiogram Percutaneous arterial thrombectomy  Antibiotics:    Time spent: 85 minutes    Erin Hearing ANP  [facility] 7 am - 330 pm/M-F for direct patient care and secure chat Please refer to Amion for contact info 23  days

## 2021-07-02 NOTE — TOC Progression Note (Addendum)
Transition of Care [facility]) - Progression Note    Patient Details  Name: TAUNIA FRASCO MRN: [id number] Date of Birth: 1941-02-15  Transition of Care [facility]) CM/SW [place], RN Phone Number: 07/02/2021, 8:01 AM  Clinical Narrative:    Case management spoke with [name], CM with [facility] and the facility is able to accept the patient as long as substitute could be found to replace the patient's Nuedexta.  [facility] SNF is unable to obtain the medication for administration since it is on back order from the supplier.  Erin Hearing, NP is aware and will speak to the pharmacist to find a viable solution.  CM called [name], CM at [facility] facility and insurance authorization will be started at the facility for admission.  07/02/2021 7116- CM called and left a message with the daughter-in-law, [name] Demir to update her on the bed offer to [facility] for the patient.   Expected Discharge Plan: Skilled Nursing Facility Barriers to Discharge: Continued Medical Work up, [place] will not accept until restraint criteria met  Expected Discharge Plan and Services Expected Discharge Plan: [place]   Discharge Planning Services: CM Consult   Living arrangements for the past 2 months: Single Family Home                                       Social Determinants of Health (SDOH) Interventions    Readmission Risk Interventions No flowsheet data found.

## 2021-07-03 ENCOUNTER — Ambulatory Visit: Payer: Medicare HMO | Admitting: Cardiovascular Disease

## 2021-07-03 DIAGNOSIS — F0391 Unspecified dementia with behavioral disturbance: Secondary | ICD-10-CM | POA: Diagnosis not present

## 2021-07-03 NOTE — Progress Notes (Signed)
[facility] PROGRESS NOTE  Patient: [name] [id number]   PCP: [name], DO DOB: 25-Nov-1940   DOA: 06/09/2021   DOS: 07/03/2021    Subjective: Confused.  No nausea or vomiting.  No acute event overnight.  Worked with physical therapy today.  Objective:  Vitals:   07/03/21 1128 07/03/21 1625  BP: 93/66 127/76  Pulse: 80 70  Resp: 14 14  Temp: 98 F (36.7 C) 98.2 F (36.8 C)  SpO2: 100% 99%    S1-S2 present. Clear to auscultation. Bowel sound present. No new focal deficit.  Assessment and plan: Agitation. Improving.  Off of restraints for last many days.  Social worker following.  Author: Berle Mull, MD [facility] 07/03/2021 7:27 PM   If 7PM-7AM, please contact night-coverage at [email or web address]

## 2021-07-03 NOTE — Progress Notes (Addendum)
Occupational Therapy Treatment Patient Details Name: [name] MRN: [id number] DOB: 06-Apr-1941 Today's Date: 07/03/2021    History of present illness Pt is 80 y.o. female admitted with acute onset Rt sided weakness and aphasia on 06/09/21.  CTA showed acute L M2 oacclusion > thrombectomy.  MRI showed:  Multifocal acute ischemia within the right internal capsule, left frontal white matter and left insula. 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  PMH includes:  cat bite with cellulitis Lt Lower leg, HTN, h/o CVAs, mild dementia   OT comments  Upon arrival, pt supine in bed with slightly restless; engaging in conversation and introducing herself. Pt presenting with poor attention and following of commands. Requiring Mod-Max A +2 for bed mobility. Pt with increased tendency for extension. Once at EOB, pt performing grooming tasks with Min A for safe sitting balance; blocking of bil knees. Continue to recommend dc to SNF and will continue to follow acutely as admitted.   Follow Up Recommendations  SNF    Equipment Recommendations  None recommended by OT    Recommendations for Other Services Rehab consult    Precautions / Restrictions Precautions Precautions: Fall Precaution Comments: restless       Mobility Bed Mobility Overal bed mobility: Needs Assistance Bed Mobility: Supine to Sit;Sit to Supine     Supine to sit: Max assist;+2 for physical assistance Sit to supine: Mod assist;+2 for physical assistance;+2 for safety/equipment   General bed mobility comments: Pt with decreased awareness and orientation thus having difficulty engaging with cues. Pt requiring Max A to elevate trunk into upright posture with tactile cues to prevent extension at knee and hips. Pt requiring Mod A to facilitate trunk back into sidlying and then elevate BLEs.    Transfers                 General transfer comment: Defer for safety    Balance Overall balance assessment: Needs  assistance Sitting-balance support: No upper extremity supported;Feet supported Sitting balance-[name]: Poor Sitting balance - Comments: Pt with tendency this session for pushing back into posterior lean. Postural control: Posterior lean                                 ADL either performed or assessed with clinical judgement   ADL Overall ADL's : Needs assistance/impaired     Grooming: Wash/dry face;Supervision/safety;Set up;Bed level;Minimal assistance;Sitting Grooming Details (indicate cue type and reason): Pt washing her face while supine in bed. Then brushing her hair while seated ar EOB with Min A +2 for safety in sitting                               General ADL Comments: Due to decreased safety and tendency for posterior lean; differed OOB activity for safety.     Vision       Perception     Praxis      Cognition Arousal/Alertness: Awake/alert Behavior During Therapy: Restless Overall Cognitive Status: Impaired/Different from baseline Area of Impairment: Orientation;Attention;Memory;Following commands;Safety/judgement;Awareness;Problem solving                 Orientation Level: Disoriented to;Place;Situation;Time Current Attention Level: Focused Memory: Decreased short-term memory Following Commands: Follows one step commands inconsistently Safety/Judgement: Decreased awareness of safety;Decreased awareness of deficits Awareness: Intellectual Problem Solving: Decreased initiation;Requires verbal cues;Difficulty sequencing;Requires tactile cues;Slow processing General Comments: Baseline dementia. Decreased  orientation today. Poor following of commands. More axious today and quickly pushing back into extension with any time of movement.        Exercises     Shoulder Instructions       General Comments      Pertinent Vitals/ Pain       Pain Assessment: Faces Pain Intervention(s): Monitored during session  Home Living                                           Prior Functioning/Environment              Frequency  Min 2X/week        Progress Toward Goals  OT Goals(current goals can now be found in the care plan section)  Progress towards OT goals: Not progressing toward goals - comment  Acute Rehab OT Goals Patient Stated Goal: pt did not state OT Goal Formulation: Patient unable to participate in goal setting Time For Goal Achievement: 07/11/21 Potential to Achieve Goals: Fair ADL Goals Pt Will Perform Eating: with set-up;with supervision;sitting Pt Will Perform Grooming: with set-up;sitting Pt Will Perform Upper Body Bathing: with min assist;sitting Pt Will Perform Lower Body Bathing: with min assist;sit to/from stand Pt Will Perform Upper Body Dressing: with min assist;sitting Pt Will Perform Lower Body Dressing: with min assist;sit to/from stand Pt Will Transfer to Toilet: with min guard assist;ambulating Pt Will Perform Toileting - Clothing Manipulation and hygiene: with min guard assist;sit to/from stand Additional ADL Goal #1: Pt will follow 1-2 step commands with minimal cues. Additional ADL Goal #2: Pt will progress to EOB with minguard assistance in preparation for ADL.  Plan Discharge plan remains appropriate    Co-evaluation                 AM-PAC OT "6 Clicks" Daily Activity     Outcome Measure   Help from another person eating meals?: A Lot Help from another person taking care of personal grooming?: A Lot Help from another person toileting, which includes using toliet, bedpan, or urinal?: A Lot Help from another person bathing (including washing, rinsing, drying)?: A Lot Help from another person to put on and taking off regular upper body clothing?: A Lot Help from another person to put on and taking off regular lower body clothing?: A Lot 6 Click Score: 12    End of Session    OT Visit Diagnosis: Unsteadiness on feet (R26.81);Cognitive  communication deficit (R41.841) Symptoms and signs involving cognitive functions: Cerebral infarction   Activity Tolerance Treatment limited secondary to agitation   Patient Left in bed;with bed alarm set;with call bell/phone within reach   Nurse Communication Mobility status        Time: [id number] OT Time Calculation (min): 26 min  Charges: OT General Charges $OT Visit: 1 Visit OT Treatments $Self Care/Home Management : 23-37 mins  [place], OTR/L Acute Rehab Pager: [phone] Office: [place] 07/03/2021, 12:50 PM

## 2021-07-03 NOTE — Plan of Care (Signed)
  Problem: Education: Goal: Knowledge of disease or condition will improve Outcome: Progressing Goal: Knowledge of secondary prevention will improve Outcome: Progressing Goal: Knowledge of patient specific risk factors addressed and post discharge goals established will improve Outcome: Progressing Goal: Individualized Educational Video(s) Outcome: Progressing   Problem: Coping: Goal: Will identify appropriate support needs Outcome: Progressing   Problem: Health Behavior/Discharge Planning: Goal: Ability to manage health-related needs will improve Outcome: Progressing   Problem: Self-Care: Goal: Ability to participate in self-care as condition permits will improve Outcome: Progressing Goal: Verbalization of feelings and concerns over difficulty with self-care will improve Outcome: Progressing Goal: Ability to communicate needs accurately will improve Outcome: Progressing   Problem: Nutrition: Goal: Risk of aspiration will decrease Outcome: Progressing Goal: Dietary intake will improve Outcome: Progressing   Problem: Ischemic Stroke/TIA Tissue Perfusion: Goal: Complications of ischemic stroke/TIA will be minimized Outcome: Progressing   Problem: Education: Goal: Knowledge of General Education information will improve Description: Including pain rating scale, medication(s)/side effects and non-pharmacologic comfort measures Outcome: Progressing   Problem: Health Behavior/Discharge Planning: Goal: Ability to manage health-related needs will improve Outcome: Progressing   Problem: Clinical Measurements: Goal: Ability to maintain clinical measurements within normal limits will improve Outcome: Progressing Goal: Will remain free from infection Outcome: Progressing Goal: Diagnostic test results will improve Outcome: Progressing Goal: Respiratory complications will improve Outcome: Progressing Goal: Cardiovascular complication will be avoided Outcome: Progressing    Problem: Activity: Goal: Risk for activity intolerance will decrease Outcome: Progressing   Problem: Nutrition: Goal: Adequate nutrition will be maintained Outcome: Progressing   Problem: Coping: Goal: Level of anxiety will decrease Outcome: Progressing   Problem: Elimination: Goal: Will not experience complications related to bowel motility Outcome: Progressing Goal: Will not experience complications related to urinary retention Outcome: Progressing   Problem: Pain Managment: Goal: General experience of comfort will improve Outcome: Progressing   Problem: Safety: Goal: Ability to remain free from injury will improve Outcome: Progressing   Problem: Skin Integrity: Goal: Risk for impaired skin integrity will decrease Outcome: Progressing

## 2021-07-03 NOTE — TOC Progression Note (Signed)
Transition of Care [facility]) - Progression Note    Patient Details  Name: [name] MRN: [id number] Date of Birth: July 06, 1941  [facility]) CM/SW [place], RN Phone Number: 07/03/2021, 10:56 AM  Clinical Narrative:    Case management left a message with [name], CM at [facility] and asked if she would notify me when the patient received insurance authorization for placement.  Patient has [employer] and it may take a few days for authorization and approval.  CM will continue to follow the patient for [facility] placement.   Expected Discharge Plan: Skilled Nursing Facility Barriers to Discharge: Continued Medical Work up, [place] will not accept until restraint criteria met  Expected Discharge Plan and Services Expected Discharge Plan: [place]   Discharge Planning Services: CM Consult   Living arrangements for the past 2 months: Single Family Home                                       Social Determinants of Health (SDOH) Interventions    Readmission Risk Interventions No flowsheet data found.

## 2021-07-03 NOTE — Progress Notes (Signed)
Physical Therapy Treatment Patient Details Name: [name] MRN: [id number] DOB: 1940-12-20 Today's Date: 07/03/2021    History of Present Illness This 80 y.o. female admitted with acute onset Rt sided weakness and aphasia.  CTA showed acute L M2 oacclusion > thrombectomy.  MRI showed:  Multifocal acute ischemia within the right internal capsule, left frontal white matter and left insula. 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 Taken to [facility] endovascular revascularization of left MCA. Postprocedure she was briefly intubated and admitted to the ICU where PCCM assisted with ventilator management. 8/16 psychiatry consulted due to ongoing confusion and delirium. Delirium treatment options somewhat limited by history of neuroleptic malignant syndrome. PMH includes:  Afib (on xarelto), cat bite with cellulitis Lt Lower leg, HTN, h/o CVAs, mild dementia    PT Comments    Pt received in supine, oriented to self (but not to year of birth), with good participation and tolerance for supine/seated therapeutic exercises, transfer and gait training. Pt continues to need max multimodal cues and increased time/repetition of cues to safely sequence and perform mobility tasks. Pt able to progress gait distance to short household distances with RW and increased time with min to modA. Pt continues to benefit from PT services to progress toward functional mobility goals.    Follow Up Recommendations  Supervision/Assistance - 24 hour;SNF     Equipment Recommendations  Rolling walker with 5" wheels;3in1 (PT);Wheelchair (measurements PT);Wheelchair cushion (measurements PT)    Recommendations for Other Services       Precautions / Restrictions Precautions Precautions: Fall Precaution Comments: restless Restrictions Weight Bearing Restrictions: No    Mobility  Bed Mobility Overal bed mobility: Needs Assistance Bed Mobility: Rolling;Sidelying to Sit;Sit to Sidelying Rolling: Mod  assist Sidelying to sit: Max assist Supine to sit: Max assist;+2 for physical assistance Sit to supine: Mod assist   General bed mobility comments: Log roll to R EOB and pt requiring Max A to elevate trunk into upright posture with tactile cues to prevent extension at knee and hips. Pt requiring Mod A to facilitate trunk back into sidelying and then elevate BLEs.    Transfers Overall transfer level: Needs assistance Equipment used: Rolling walker (2 wheeled) Transfers: Sit to/from Stand Sit to Stand: Mod assist         General transfer comment: from EOB<>RW x3 reps total, needs increased time (~2 attempts to achieve upright each successful rep) and heavy use of momentum strategy and modA lift assist to achieve upright.  Ambulation/Gait Ambulation/Gait assistance: Min assist;Mod assist Gait Distance (Feet): 60 Feet Assistive device: Rolling walker (2 wheeled);None Gait Pattern/deviations: Decreased stride length;Trunk flexed;Shuffle;Festinating;Narrow base of support;Step-to pattern;Decreased dorsiflexion - right;Decreased dorsiflexion - left Gait velocity: decreased Gait velocity interpretation: <1.8 ft/sec, indicate of risk for recurrent falls General Gait Details: pt tending to hold RW too far outside BOS but does better with RW for support, pt slightly improves step length with cues but tending to festinating/shuffled steps with narrow BOS, needs mostly minA for anterior steps but up to modA when turning for stability/RW directional navigation       Balance Overall balance assessment: Needs assistance Sitting-balance support: No upper extremity supported;Feet supported Sitting balance-[name]: Poor Sitting balance - Comments: Pt with tendency this session for pushing back into posterior lean, needs increased time and tactile cues for foot flat/anterior lean; progressed to min guard/Supervision after ~3 mins seated Postural control: Posterior lean Standing balance support:  Bilateral upper extremity supported Standing balance-[name]: Poor Standing balance comment:  min A needed for safety in standing due to decreased patient awareness of safety deficits                 Cognition Arousal/Alertness: Awake/alert Behavior During Therapy: Restless;Flat affect Overall Cognitive Status: Impaired/Different from baseline Area of Impairment: Orientation;Attention;Memory;Following commands;Safety/judgement;Awareness;Problem solving          Orientation Level: Disoriented to;Place;Situation;Time Current Attention Level: Focused Memory: Decreased short-term memory Following Commands: Follows one step commands inconsistently Safety/Judgement: Decreased awareness of safety;Decreased awareness of deficits Awareness: Intellectual Problem Solving: Decreased initiation;Requires verbal cues;Difficulty sequencing;Requires tactile cues;Slow processing General Comments: Baseline dementia. Decreased orientation today. Slow processing of commands, needs multiple repeated multimodal cues to respond.      Exercises General Exercises - Lower Extremity Ankle Circles/Pumps: AROM;Both;10 reps;Supine Long Arc Quad: AROM;Both;10 reps;Seated Hip Flexion/Marching: AROM;Both;5 reps;Seated    General Comments General comments (skin integrity, edema, etc.): HR 73 bpm and SpO2 96% on RA      Pertinent Vitals/Pain Pain Assessment: Faces Pain Score: 0-No pain Pain Intervention(s): Monitored during session;Repositioned           PT Goals (current goals can now be found in the care plan section) Acute Rehab PT Goals Patient Stated Goal: pt did not state PT Goal Formulation: Patient unable to participate in goal setting Time For Goal Achievement: 07/08/21 Progress towards PT goals: Progressing toward goals    Frequency    Min 2X/week      PT Plan Current plan remains appropriate       AM-PAC PT "6 Clicks" Mobility   Outcome Measure  Help needed turning from  your back to your side while in a flat bed without using bedrails?: A Lot Help needed moving from lying on your back to sitting on the side of a flat bed without using bedrails?: A Lot Help needed moving to and from a bed to a chair (including a wheelchair)?: A Lot Help needed standing up from a chair using your arms (e.g., wheelchair or bedside chair)?: A Lot Help needed to walk in hospital room?: A Lot Help needed climbing 3-5 steps with a railing? : Total 6 Click Score: 11    End of Session Equipment Utilized During Treatment: Gait belt Activity Tolerance: Patient tolerated treatment well Patient left: with call bell/phone within reach;in bed;with bed alarm set Nurse Communication: Mobility status PT Visit Diagnosis: Unsteadiness on feet (R26.81);Other abnormalities of gait and mobility (R26.89);Difficulty in walking, not elsewhere classified (R26.2);Other symptoms and signs involving the nervous system (R29.898)     Time: [id number] PT Time Calculation (min) (ACUTE ONLY): 27 min  Charges:  $Gait Training: 8-22 mins $Therapeutic Exercise: 8-22 mins                     Shermon Bozzi P., PTA Acute Rehabilitation Services Pager: [phone] Office: [place] 07/03/2021, 2:53 PM

## 2021-07-04 DIAGNOSIS — Z743 Need for continuous supervision: Secondary | ICD-10-CM | POA: Diagnosis not present

## 2021-07-04 DIAGNOSIS — R69 Illness, unspecified: Secondary | ICD-10-CM | POA: Diagnosis not present

## 2021-07-04 DIAGNOSIS — I872 Venous insufficiency (chronic) (peripheral): Secondary | ICD-10-CM | POA: Diagnosis not present

## 2021-07-04 DIAGNOSIS — N319 Neuromuscular dysfunction of bladder, unspecified: Secondary | ICD-10-CM | POA: Diagnosis not present

## 2021-07-04 DIAGNOSIS — I63512 Cerebral infarction due to unspecified occlusion or stenosis of left middle cerebral artery: Secondary | ICD-10-CM | POA: Diagnosis not present

## 2021-07-04 DIAGNOSIS — G309 Alzheimer's disease, unspecified: Secondary | ICD-10-CM | POA: Diagnosis not present

## 2021-07-04 DIAGNOSIS — F0150 Vascular dementia without behavioral disturbance: Secondary | ICD-10-CM | POA: Diagnosis not present

## 2021-07-04 DIAGNOSIS — F0280 Dementia in other diseases classified elsewhere without behavioral disturbance: Secondary | ICD-10-CM | POA: Diagnosis not present

## 2021-07-04 DIAGNOSIS — R4182 Altered mental status, unspecified: Secondary | ICD-10-CM | POA: Diagnosis not present

## 2021-07-04 DIAGNOSIS — M6281 Muscle weakness (generalized): Secondary | ICD-10-CM | POA: Diagnosis not present

## 2021-07-04 DIAGNOSIS — G210 Malignant neuroleptic syndrome: Secondary | ICD-10-CM | POA: Diagnosis not present

## 2021-07-04 DIAGNOSIS — Z8673 Personal history of transient ischemic attack (TIA), and cerebral infarction without residual deficits: Secondary | ICD-10-CM | POA: Diagnosis not present

## 2021-07-04 DIAGNOSIS — E785 Hyperlipidemia, unspecified: Secondary | ICD-10-CM | POA: Diagnosis not present

## 2021-07-04 DIAGNOSIS — R410 Disorientation, unspecified: Secondary | ICD-10-CM | POA: Diagnosis not present

## 2021-07-04 DIAGNOSIS — G459 Transient cerebral ischemic attack, unspecified: Secondary | ICD-10-CM | POA: Diagnosis not present

## 2021-07-04 DIAGNOSIS — I69398 Other sequelae of cerebral infarction: Secondary | ICD-10-CM | POA: Diagnosis not present

## 2021-07-04 DIAGNOSIS — M810 Age-related osteoporosis without current pathological fracture: Secondary | ICD-10-CM | POA: Diagnosis not present

## 2021-07-04 LAB — BASIC METABOLIC PANEL
Anion gap: 8 (ref 5–15)
BUN: 23 mg/dL (ref 8–23)
CO2: 25 mmol/L (ref 22–32)
Calcium: 8.8 mg/dL — ABNORMAL LOW (ref 8.9–10.3)
Chloride: 106 mmol/L (ref 98–111)
Creatinine, Ser: 0.68 mg/dL (ref 0.44–1.00)
GFR, Estimated: >60 mL/min (ref >60)
Glucose, Bld: 90 mg/dL (ref 70–99)
Potassium: 4.3 mmol/L (ref 3.5–5.1)
Sodium: 139 mmol/L (ref 135–145)

## 2021-07-04 LAB — CBC
HCT: 41 % (ref 36.0–46.0)
Hemoglobin: 13.3 g/dL (ref 12.0–15.0)
MCH: 29.4 pg (ref 26.0–34.0)
MCHC: 32.4 g/dL (ref 30.0–36.0)
MCV: 90.7 fL (ref 80.0–100.0)
Platelets: 392 10*3/uL (ref 150–400)
RBC: 4.52 MIL/uL (ref 3.87–5.11)
RDW: 14.2 % (ref 11.5–15.5)
WBC: 8.5 10*3/uL (ref 4.0–10.5)
nRBC: 0 % (ref 0.0–0.2)

## 2021-07-04 LAB — RESP PANEL BY RT-PCR (FLU A&B, COVID) ARPGX2
Influenza A by PCR: NEGATIVE
Influenza B by PCR: NEGATIVE
SARS Coronavirus 2 by RT PCR: NEGATIVE

## 2021-07-04 MED ORDER — SENNOSIDES-DOCUSATE SODIUM 8.6-50 MG PO TABS: 1.0000 | ORAL_TABLET | Freq: Every evening | ORAL | Status: AC | PRN

## 2021-07-04 MED ORDER — RIVAROXABAN 20 MG PO TABS: 20.0000 mg | ORAL_TABLET | Freq: Every day | ORAL | Status: DC

## 2021-07-04 MED ORDER — PROPRANOLOL HCL 10 MG PO TABS: 10.0000 mg | ORAL_TABLET | Freq: Three times a day (TID) | ORAL | Status: DC

## 2021-07-04 MED ORDER — CLONAZEPAM 0.125 MG PO TBDP: 0.1250 mg | ORAL_TABLET | Freq: Two times a day (BID) | ORAL | 0 refills | Status: DC

## 2021-07-04 MED ORDER — TAMSULOSIN HCL 0.4 MG PO CAPS: 0.4000 mg | ORAL_CAPSULE | Freq: Every day | ORAL | Status: AC

## 2021-07-04 MED ORDER — QUETIAPINE FUMARATE 50 MG PO TABS: 50.0000 mg | ORAL_TABLET | Freq: Three times a day (TID) | ORAL | Status: DC

## 2021-07-04 MED ORDER — DIVALPROEX SODIUM 125 MG PO CSDR: 250.0000 mg | DELAYED_RELEASE_CAPSULE | Freq: Two times a day (BID) | ORAL | Status: DC

## 2021-07-04 MED ORDER — ACETAMINOPHEN 325 MG PO TABS: 650.0000 mg | ORAL_TABLET | ORAL | Status: AC | PRN

## 2021-07-04 NOTE — Progress Notes (Signed)
Report given to [facility]. All questions were answered.

## 2021-07-04 NOTE — Discharge Summary (Signed)
Physician Discharge Summary  [name] [id number] DOB: [birth year] DOA: 06/09/2021  PCP: [name], DO  Admit date: 06/09/2021 Discharge date: 07/04/2021  Time spent: 35 minutes  Recommendations for Outpatient Follow-up:  Will need to follow-up with [facility] after discharge.  Ambulatory referral has been sent and the office will notify facility of appointment date and time [facility] Continue PT and OT services to assist patient in improving with gait and ambulation.  Patient also needs to be encouraged to participate in facility-based activities to allow for appropriate socialization and increase opportunities for mobility. Please note patient definitely requires assistance with meals and regarding to set up tray.  For the most part she does require feeding although occasionally can independently participate with feeding but when left to her own devices only eats about 15% of her meals.   Discharge Diagnoses:  Active Problems:   Stroke [facility])   Acute ischemic left MCA stroke (HCC)   Middle cerebral artery embolism, left   Agitation due to dementia [facility])   Ambulatory dysfunction    Discharge Condition: Stable  Diet recommendation: Regular  Filed Weights   06/12/21 0743  Weight: 92.3 kg    History of present illness:  80 year old female with past medical history of A. fib on Xarelto, dementia.  Presented with report of aphasia and right-sided weakness.  Found to have left M2 occlusion acute stroke.  Hospital course has been complicated by acute delirium and agitation with suspected underlying dementia that was not diagnosed prior to admission.   8/6-8/7 CT with hyperdense left M2 branch, MRI multiple acute ischemia.  Taken to Cath Lab SP endovascular revascularization of left MCA.  Postprocedure she was briefly intubated and admitted to the ICU where PCCM assisted with ventilator management.  8/16 psychiatry consulted due to ongoing confusion  and delirium. Currently plan is to treat delirium.  Treatment options somewhat limited by history of neuroleptic malignant syndrome.  Hospital Course:  Acute right ICA, left insular and left frontal infarct embolic pattern Secondary to embolization due to A. Fib (Patient was off of Xarelto due to hematuria). SP intervention by neuro IR for revascularization of left MCA. Echocardiogram: EF 65 to 70%  LDL 30 - hemoglobin A1c 5.3. Continue Xarelto and max dose Lipitor   Dementia with delirium and agitation/ History of neuroleptic malignant syndrome May 2022 Does not have official diagnosis of dementia but is inconsistent with that diagnosis Ongoing sundowning issues. Continue Depakote, Seroquel, and propranolol. Patient has history of neuroleptic malignant syndrome in May 22 and therefore will be avoiding further up titration of Seroquel.   Physical deconditioning after CVA Patient having a difficult time with participating with PT secondary to confusion and sensation she was falling PT recommends SNF.   Chronic A. fib HTN Currently rate controlled on Inderal Continue Xarelto for stroke prophylaxis Echocardiogram with hyperdynamic features with mild eccentric LVH without apparent diastolic dysfunction  Has significant biatrial dilatation   Hematuria Has not recurred during current hospitalization or with resumption of Xarelto   HLD Continue max dose Lipitor   Intermittent urinary retention. Resolved after initiation of Flomax   Obesity/Nutrition Placing the patient at high risk of poor outcome.    Procedures: Echocardiogram Percutaneous arterial thrombectomy  Consultations: Interventional radiology Neurology Psychiatry PCCM  Discharge Exam: Vitals:   07/04/21 0333 07/04/21 0736  BP: 128/80 (!) (P) 174/74  Pulse: (!) 55 (P) 60  Resp: 18 (P) 18  Temp: 98.3 F (36.8 C) (P) 97.7 F (36.5  C)  SpO2: 95%    Constitutional: NAD, calm Respiratory: Stable on room  air, no increased work of breathing when supine.  Clear to auscultation Cardiovascular: Regular rate and rhythm, no murmurs / rubs / gallops.  Warm and dry. Abdomen: no tenderness, no masses palpated. Bowel sounds positive. LBM 8/24 Neurologic: CN 2-12 grossly intact. Sensation intact, Strength 3/5 x all 4 extremities.  Documented disequilibrium even with 2+ assist while standing. Psychiatric: Alert.  Oriented to name only.   Discharge Instructions   Discharge Instructions     Ambulatory referral to Neurology   Complete by: As directed    Follow up with stroke clinic NP (Jessica Vanschaick or Cecille Rubin, if both not available, consider Zachery Dauer, or Ahern) at [facility] in about 4 weeks. Thanks.  Patient is discharging to [facility].  These notify that facility of appointment date and time.   Diet general   Complete by: As directed    Patient requires assistance with feeding.  Most of the time she is a total feed but occasionally can participate independently.   Discharge instructions   Complete by: As directed    Continue physical therapy and Occupational Therapy as outlined in the discharge summary.   Increase activity slowly   Complete by: As directed    No wound care   Complete by: As directed       Allergies as of 07/04/2021       Reactions   Amlodipine Other (See Comments)   Leg swelling   Haldol [haloperidol Lactate] Other (See Comments)   Neuroleptic Malignant syndrome   Hctz [hydrochlorothiazide] Nausea Only, Rash        Medication List     STOP taking these medications    ciprofloxacin 500 MG tablet Commonly known as: CIPRO   Dextromethorphan-quiNIDine 20-10 MG capsule Commonly known as: NUEDEXTA   diclofenac Sodium 1 % Gel Commonly known as: Voltaren   hydrALAZINE 10 MG tablet Commonly known as: APRESOLINE   hydrALAZINE 25 MG tablet Commonly known as: APRESOLINE   nystatin 100000 UNIT/ML suspension Commonly known as: MYCOSTATIN        TAKE these medications    acetaminophen 325 MG tablet Commonly known as: TYLENOL Take 2 tablets (650 mg total) by mouth every 4 (four) hours as needed for mild pain (or temp > 37.5 C (99.5 F)).   atorvastatin 40 MG tablet Commonly known as: LIPITOR Take 1 tablet (40 mg total) by mouth at bedtime.   clonazepam 0.125 MG disintegrating tablet Commonly known as: KLONOPIN Take 1 tablet (0.125 mg total) by mouth 2 (two) times daily.   divalproex 125 MG capsule Commonly known as: DEPAKOTE SPRINKLE Take 2 capsules (250 mg total) by mouth every 12 (twelve) hours.   propranolol 10 MG tablet Commonly known as: INDERAL Take 1 tablet (10 mg total) by mouth 3 (three) times daily. What changed: when to take this   QUEtiapine 50 MG tablet Commonly known as: SEROQUEL Take 1 tablet (50 mg total) by mouth 3 (three) times daily.   rivaroxaban 20 MG Tabs tablet Commonly known as: Xarelto Take 1 tablet (20 mg total) by mouth daily with supper.   senna-docusate 8.6-50 MG tablet Commonly known as: Senokot-S Take 1 tablet by mouth at bedtime as needed for moderate constipation or mild constipation.   tamsulosin 0.4 MG Caps capsule Commonly known as: FLOMAX Take 1 capsule (0.4 mg total) by mouth daily after supper.       Allergies  Allergen Reactions  Amlodipine Other (See Comments)    Leg swelling   Haldol [Haloperidol Lactate] Other (See Comments)    Neuroleptic Malignant syndrome    Hctz [Hydrochlorothiazide] Nausea Only and Rash    Follow-up Information     [facility]. Schedule an appointment as soon as possible for a visit in 1 month(s).   Specialty: Neurology Why: stroke clinic Contact information: [address] [place] [phone]                 The results of significant diagnostics from this hospitalization (including imaging, microbiology, ancillary and laboratory) are listed below for reference.     Significant Diagnostic Studies: MR BRAIN WO CONTRAST  Result Date: 06/10/2021 CLINICAL DATA:  Neuro deficit, acute, stroke suspected EXAM: MRI HEAD WITHOUT CONTRAST TECHNIQUE: Multiplanar, multiecho pulse sequences of the brain and surrounding structures were obtained without intravenous contrast. COMPARISON:  None. FINDINGS: Brain: Multifocal acute ischemia within the right internal capsule, left frontal white matter and left insula. Old left occipital infarct and old left frontal infarct. Bilateral confluent hyperintense T2-weighted signal in the white matter. Normal volume of CSF spaces. No chronic microhemorrhage. Normal midline structures. Vascular: Major flow voids are preserved. Skull and upper cervical spine: Normal calvarium and skull base. Visualized upper cervical spine and soft tissues are normal. Sinuses/Orbits:No paranasal sinus fluid levels or advanced mucosal thickening. No mastoid or middle ear effusion. Normal orbits. IMPRESSION: 1. Multifocal acute ischemia within the right internal capsule, left frontal white matter and left insula. No hemorrhage or mass effect. 2. 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 Electronically Signed   By: Ulyses Jarred M.D.   On: 06/10/2021 01:52   IR CT Head Ltd  Result Date: 06/12/2021 INDICATION: New onset of global aphasia. EXAM: 1. EMERGENT LARGE VESSEL OCCLUSION THROMBOLYSIS (anterior CIRCULATION) COMPARISON:  CT angiogram of head and neck of June 09, 2021. MEDICATIONS: Ancef 2 g IV antibiotic was administered within 1 hour of the procedure. ANESTHESIA/SEDATION: General anesthesia. CONTRAST:  Omnipaque 300 approximately 100 mL. FLUOROSCOPY TIME:  Fluoroscopy Time: 64 minutes 50 seconds (1384 mGy). COMPLICATIONS: None immediate. TECHNIQUE: Following a full explanation of the procedure along with the potential associated complications, an informed witnessed consent was obtained. The risks of intracranial hemorrhage of 10%, worsening neurological  deficit, ventilator dependency, death and inability to revascularize were all reviewed in detail with the patient's daughter. The patient was then put under general anesthesia by the Department of Anesthesiology at [facility]. The right groin was prepped and draped in the usual sterile fashion. Thereafter using modified Seldinger technique, transfemoral access into the right common femoral artery was obtained without difficulty. Over a 0.035 inch guidewire an 8 [place] Pinnacle 25 cm sheath was inserted. Through this, and also over a 0.035 inch guidewire a 5.5 [place] Simmons 2 support catheter inside of an 088 TracStar guide catheter combination was advanced to the aortic arch region. A Simmons 2 support catheter was then advanced over the guidewire to the mid cervical left ICA by the TracStar guide catheter. The support catheter and the wire were removed. Good aspiration was obtained from the hub of the TracStar guide catheter. FINDINGS: A gentle control arteriogram performed through this demonstrated no evidence of dissections or spasm, or of intraluminal filling defects. More distally, the left internal carotid artery in the petrous, cavernous and supraclinoid segments demonstrate wide patency. The left anterior cerebral artery opacifies into the capillary and venous phases. The left middle cerebral artery M1  segment demonstrates wide patency. Complete angiographic occlusion was seen of the dominant inferior division in the mid M2 segment. The superior division remained widely patent into the capillary and venous phases. PROCEDURE: Over a 0.014 inch standard Synchro micro guidewire with a J-tip configuration, a 162 cm 021 Trevo trak inside of a 55 132 cm Zoom aspiration catheter combination was advanced to the supraclinoid left ICA. The micro guidewire was then gently advanced through the occluded inferior division into the M2 M3 region followed by the microcatheter. The guidewire was removed. Good  aspiration obtained from the hub of the microcatheter. A gentle control arteriogram performed through the microcatheter demonstrated good antegrade flow. This was then connected to continuous heparinized saline infusion. A 4 mm x 40 mm Solitaire X retrieval device was then advanced to the distal end of the microcatheter. The O ring on the delivery microcatheter was loosened. With slight forward gentle traction with the right hand on the delivery micro guidewire with the left hand the microcatheter was retrieved deploying the retrieval device proximal and just proximal to the occluded inferior division. At this time the 055 Zoom aspiration catheter was advanced and engaged in the proximal inferior division. Thereafter, constant aspiration was applied with a 20 mL syringe at the hub of an 088 Zoom TrakStar catheter, and with Penumbra aspiration device at the hub of the Zoom aspiration catheter for approximately 2-1/2 minutes. Thereafter, the combination of the microcatheter retrieval device, and the Zoom aspiration catheter were retrieved and removed. A control arteriogram performed through 088 guide catheter in the left internal carotid artery demonstrated revascularization of the previously occluded inferior division with patency of the superior division maintained. A TICI 3 revascularization was obtained. The left anterior cerebral artery circulation demonstrated wide patency. The Zoom 088 catheter was retrieved into the common carotid artery. A control arteriogram performed through this demonstrated patency of the left external carotid artery and its major branches. The left internal carotid artery at the bulb and extracranially appeared widely patent without evidence of intraluminal filling defects or of occlusions or dissections. The 088 Zoom catheter was removed. Hemostasis at the right groin puncture site was sealed with a 6 French Angio-Seal closure device. Distal pulses remained Dopplerable in both feet  unchanged. CT of the brain demonstrated no evidence of intracranial hemorrhage or mass effect or midline shift. Patient was left intubated awaiting the COVID test. Patient was transferred to neuro ICU for post revascularization care. IMPRESSION: Status post endovascular complete revascularization of occluded dominant inferior division of the left middle cerebral artery with 1 pass with a 4 mm x 40 mm Solitaire X retrieval device, and contact aspiration achieving a TICI 3 revascularization. PLAN: Follow up as per referring MD. Electronically Signed   By: Luanne Bras M.D.   On: 06/11/2021 11:18   DG CHEST PORT 1 VIEW  Result Date: 06/17/2021 CLINICAL DATA:  Fever, recent extubation. EXAM: PORTABLE CHEST 1 VIEW COMPARISON:  Chest radiograph dated 06/09/2021. FINDINGS: The heart is enlarged. Vascular calcifications are seen in the aortic arch. The lungs are clear. There is no pleural effusion or pneumothorax. Degenerative changes are seen in the spine. IMPRESSION: No active pulmonary disease.  Cardiomegaly. Aortic Atherosclerosis (ICD10-I70.0). Electronically Signed   By: Zerita Boers M.D.   On: 06/17/2021 10:35   DG CHEST PORT 1 VIEW  Result Date: 06/09/2021 CLINICAL DATA:  ET tube placement EXAM: PORTABLE CHEST 1 VIEW COMPARISON:  03/31/2021 FINDINGS: Endotracheal tube is 2 cm above the carina. NG tube enters the  stomach. Cardiomegaly. Lungs clear. No effusions or edema. Aortic atherosclerosis. No acute bony abnormality. IMPRESSION: ET tube 2 cm above the carina. Cardiomegaly.  Aortic atherosclerosis. No active disease. Electronically Signed   By: Rolm Baptise M.D.   On: 06/09/2021 22:00   VAS [place] GROIN PSEUDOANEURYSM  Result Date: 06/10/2021  ARTERIAL PSEUDOANEURYSM  Patient Name:  EDRINA ARAYA Konen  Date of Exam:   06/10/2021 Medical Rec #: [id number]     Accession #:    [id number] Date of Birth: 11-13-1940      Patient Gender: F Patient Age:   80 years Exam Location:  [facility] Procedure:      VAS  [place] Gloriajean Dell Referring Phys: Durenda Guthrie --------------------------------------------------------------------------------  Exam: Right groin Indications: Patient complains of "mild hardening" noted under RT CFA puncture site per MD. History: S/p IR procedure. Comparison Study: no previous exams Performing Technologist: Jody Hill RVT, RDMS  Examination Guidelines: A complete evaluation includes B-mode imaging, spectral Doppler, color Doppler, and power Doppler as needed of all accessible portions of each vessel. Bilateral testing is considered an integral part of a complete examination. Limited examinations for reoccurring indications may be performed as noted. +------------+----------+--------+------+----------+ Right DuplexPSV (cm/s)WaveformPlaqueComment(s) +------------+----------+--------+------+----------+ CFA            139    biphasic                 +------------+----------+--------+------+----------+ PFA            132    biphasic                 +------------+----------+--------+------+----------+ Prox SFA       139    biphasic                 +------------+----------+--------+------+----------+  Summary: No evidence of pseudoaneurysm, AVF or DVT  Diagnosing physician: Jamelle Haring Electronically signed by Jamelle Haring on 06/10/2021 at 2:04:52 PM.   --------------------------------------------------------------------------------    Final    ECHOCARDIOGRAM COMPLETE  Result Date: 06/10/2021    ECHOCARDIOGRAM REPORT   Patient Name:   [facility] A Mccarron Date of Exam: 06/10/2021 Medical Rec #:  [id number]    Height:       65.0 in Accession #:    [id number]   Weight:       203.5 lb Date of Birth:  [birth year]     BSA:          1.993 m Patient Age:    81 years     BP:           134/59 mmHg Patient Gender: F            HR:           77 bpm. Exam Location:  Inpatient Procedure: 2D Echo, Cardiac Doppler, Color Doppler and 3D Echo Indications:    Stroke I63.9  History:        Patient has prior  history of Echocardiogram examinations, most                 recent 03/31/2021. Signs/Symptoms:Altered Mental Status.  Sonographer:    Merrie Roof RDCS Referring Phys: [id number] [place]  1. Left ventricular ejection fraction, by estimation, is 65 to 70%. The left ventricle has normal function. The left ventricle has no regional wall motion abnormalities. There is mild eccentric left ventricular hypertrophy of the posterior-lateral segment. Left ventricular diastolic parameters are indeterminate.  2. Right ventricular systolic function is normal. The right ventricular size is  normal. There is normal pulmonary artery systolic pressure. The estimated right ventricular systolic pressure is 99991111 mmHg.  3. Left atrial size was severely dilated.  4. Right atrial size was moderately dilated.  5. The mitral valve is degenerative. Mild to moderate mitral valve regurgitation. No evidence of mitral stenosis. Moderate mitral annular calcification.  6. The aortic valve is grossly normal. There is mild calcification of the aortic valve. Aortic valve regurgitation is not visualized. Mild aortic valve sclerosis is present, with no evidence of aortic valve stenosis.  7. The inferior vena cava is normal in size with greater than 50% respiratory variability, suggesting right atrial pressure of 3 mmHg. Conclusion(s)/Recommendation(s): No intracardiac source of embolism detected on this transthoracic study. A transesophageal echocardiogram can be considered to exclude cardiac source of embolism if clinically indicated. FINDINGS  Left Ventricle: Left ventricular ejection fraction, by estimation, is 65 to 70%. The left ventricle has normal function. The left ventricle has no regional wall motion abnormalities. 3D left ventricular ejection fraction analysis performed but not reported based on interpreter judgement due to suboptimal quality. The left ventricular internal cavity size was normal in size. There is mild  eccentric left ventricular hypertrophy of the posterior-lateral segment. Left ventricular diastolic parameters are indeterminate. Right Ventricle: The right ventricular size is normal. No increase in right ventricular wall thickness. Right ventricular systolic function is normal. There is normal pulmonary artery systolic pressure. The tricuspid regurgitant velocity is 2.65 m/s, and  with an assumed right atrial pressure of 3 mmHg, the estimated right ventricular systolic pressure is 99991111 mmHg. Left Atrium: Left atrial size was severely dilated. Right Atrium: Right atrial size was moderately dilated. Pericardium: There is no evidence of pericardial effusion. Mitral Valve: The mitral valve is degenerative in appearance. Moderate mitral annular calcification. Mild to moderate mitral valve regurgitation. No evidence of mitral valve stenosis. The mean mitral valve gradient is 2.3 mmHg with average heart rate of 64 bpm. Tricuspid Valve: The tricuspid valve is normal in structure. Tricuspid valve regurgitation is mild . No evidence of tricuspid stenosis. Aortic Valve: The aortic valve is grossly normal. There is mild calcification of the aortic valve. Aortic valve regurgitation is not visualized. Mild aortic valve sclerosis is present, with no evidence of aortic valve stenosis. Aortic valve mean gradient  measures 8.0 mmHg. Aortic valve peak gradient measures 15.2 mmHg. Aortic valve area, by VTI measures 2.28 cm. Pulmonic Valve: The pulmonic valve was normal in structure. Pulmonic valve regurgitation is trivial. No evidence of pulmonic stenosis. Aorta: The aortic root is normal in size and structure. Venous: The inferior vena cava is normal in size with greater than 50% respiratory variability, suggesting right atrial pressure of 3 mmHg. IAS/Shunts: The interatrial septum was not well visualized.  LEFT VENTRICLE PLAX 2D LVIDd:         6.40 cm LVIDs:         4.00 cm LV PW:         1.20 cm LV IVS:        0.80 cm LVOT diam:      2.00 cm  3D Volume EF: LV SV:         91       3D EF:        71 % LV SV Index:   46       LV EDV:       142 ml LVOT Area:     3.14 cm LV ESV:       41  ml                         LV SV:        100 ml RIGHT VENTRICLE RV Basal diam:  3.90 cm LEFT ATRIUM              Index        RIGHT ATRIUM           Index LA diam:        6.20 cm  3.11 cm/m   RA Area:     27.20 cm LA Vol (A2C):   175.0 ml 87.82 ml/m  RA Volume:   73.60 ml  36.93 ml/m LA Vol (A4C):   252.0 ml 126.46 ml/m LA Biplane Vol: 220.0 ml 110.40 ml/m  AORTIC VALVE AV Area (Vmax):    2.17 cm AV Area (Vmean):   2.25 cm AV Area (VTI):     2.28 cm AV Vmax:           195.00 cm/s AV Vmean:          128.000 cm/s AV VTI:            0.401 m AV Peak Grad:      15.2 mmHg AV Mean Grad:      8.0 mmHg LVOT Vmax:         135.00 cm/s LVOT Vmean:        91.800 cm/s LVOT VTI:          0.291 m LVOT/AV VTI ratio: 0.73  AORTA Ao Root diam: 3.30 cm MITRAL VALVE           TRICUSPID VALVE MV Mean grad: 2.3 mmHg TR Peak grad:   28.1 mmHg                        TR Vmax:        265.00 cm/s                         SHUNTS                        Systemic VTI:  0.29 m                        Systemic Diam: 2.00 cm Cherlynn Kaiser MD Electronically signed by Cherlynn Kaiser MD Signature Date/Time: 06/10/2021/4:27:03 PM    Final    IR PERCUTANEOUS ART THROMBECTOMY/INFUSION INTRACRANIAL INC DIAG ANGIO  Result Date: 06/12/2021 INDICATION: New onset of global aphasia. EXAM: 1. EMERGENT LARGE VESSEL OCCLUSION THROMBOLYSIS (anterior CIRCULATION) COMPARISON:  CT angiogram of head and neck of June 09, 2021. MEDICATIONS: Ancef 2 g IV antibiotic was administered within 1 hour of the procedure. ANESTHESIA/SEDATION: General anesthesia. CONTRAST:  Omnipaque 300 approximately 100 mL. FLUOROSCOPY TIME:  Fluoroscopy Time: 64 minutes 50 seconds (1384 mGy). COMPLICATIONS: None immediate. TECHNIQUE: Following a full explanation of the procedure along with the potential associated complications, an  informed witnessed consent was obtained. The risks of intracranial hemorrhage of 10%, worsening neurological deficit, ventilator dependency, death and inability to revascularize were all reviewed in detail with the patient's daughter. The patient was then put under general anesthesia by the Department of Anesthesiology at [facility]. The right groin was prepped and draped in the usual sterile fashion. Thereafter using modified Seldinger technique, transfemoral access into the right common femoral  artery was obtained without difficulty. Over a 0.035 inch guidewire an 8 [place] Pinnacle 25 cm sheath was inserted. Through this, and also over a 0.035 inch guidewire a 5.5 [place] Simmons 2 support catheter inside of an 088 TracStar guide catheter combination was advanced to the aortic arch region. A Simmons 2 support catheter was then advanced over the guidewire to the mid cervical left ICA by the TracStar guide catheter. The support catheter and the wire were removed. Good aspiration was obtained from the hub of the TracStar guide catheter. FINDINGS: A gentle control arteriogram performed through this demonstrated no evidence of dissections or spasm, or of intraluminal filling defects. More distally, the left internal carotid artery in the petrous, cavernous and supraclinoid segments demonstrate wide patency. The left anterior cerebral artery opacifies into the capillary and venous phases. The left middle cerebral artery M1 segment demonstrates wide patency. Complete angiographic occlusion was seen of the dominant inferior division in the mid M2 segment. The superior division remained widely patent into the capillary and venous phases. PROCEDURE: Over a 0.014 inch standard Synchro micro guidewire with a J-tip configuration, a 162 cm 021 Trevo trak inside of a 55 132 cm Zoom aspiration catheter combination was advanced to the supraclinoid left ICA. The micro guidewire was then gently advanced through the occluded  inferior division into the M2 M3 region followed by the microcatheter. The guidewire was removed. Good aspiration obtained from the hub of the microcatheter. A gentle control arteriogram performed through the microcatheter demonstrated good antegrade flow. This was then connected to continuous heparinized saline infusion. A 4 mm x 40 mm Solitaire X retrieval device was then advanced to the distal end of the microcatheter. The O ring on the delivery microcatheter was loosened. With slight forward gentle traction with the right hand on the delivery micro guidewire with the left hand the microcatheter was retrieved deploying the retrieval device proximal and just proximal to the occluded inferior division. At this time the 055 Zoom aspiration catheter was advanced and engaged in the proximal inferior division. Thereafter, constant aspiration was applied with a 20 mL syringe at the hub of an 088 Zoom TrakStar catheter, and with Penumbra aspiration device at the hub of the Zoom aspiration catheter for approximately 2-1/2 minutes. Thereafter, the combination of the microcatheter retrieval device, and the Zoom aspiration catheter were retrieved and removed. A control arteriogram performed through 088 guide catheter in the left internal carotid artery demonstrated revascularization of the previously occluded inferior division with patency of the superior division maintained. A TICI 3 revascularization was obtained. The left anterior cerebral artery circulation demonstrated wide patency. The Zoom 088 catheter was retrieved into the common carotid artery. A control arteriogram performed through this demonstrated patency of the left external carotid artery and its major branches. The left internal carotid artery at the bulb and extracranially appeared widely patent without evidence of intraluminal filling defects or of occlusions or dissections. The 088 Zoom catheter was removed. Hemostasis at the right groin puncture site was  sealed with a 6 French Angio-Seal closure device. Distal pulses remained Dopplerable in both feet unchanged. CT of the brain demonstrated no evidence of intracranial hemorrhage or mass effect or midline shift. Patient was left intubated awaiting the COVID test. Patient was transferred to neuro ICU for post revascularization care. IMPRESSION: Status post endovascular complete revascularization of occluded dominant inferior division of the left middle cerebral artery with 1 pass with a 4 mm x 40 mm Solitaire X retrieval device, and contact aspiration  achieving a TICI 3 revascularization. PLAN: Follow up as per referring MD. Electronically Signed   By: Luanne Bras M.D.   On: 06/11/2021 11:18   CT HEAD CODE STROKE WO CONTRAST  Result Date: 06/09/2021 CLINICAL DATA:  Code stroke. Right-sided weakness and slurred speech EXAM: CT HEAD WITHOUT CONTRAST TECHNIQUE: Contiguous axial images were obtained from the base of the skull through the vertex without intravenous contrast. COMPARISON:  03/30/2021 brain MRI FINDINGS: Brain: No evidence of acute infarction, hemorrhage, hydrocephalus, extra-axial collection or mass lesion/mass effect. Remote left occipital and lateral frontal cortex and white matter infarcts, moderate in size. Confluent chronic small vessel ischemia in the hemispheric white matter. No visible acute infarct. Stable sulcal calcification along the right frontal convexity. Vascular: Best seen on reformats is a branching hyperdensity in the left M2 region. CTA is pending. Atherosclerosis Skull: No acute finding Sinuses/Orbits: No acute finding Other: These results were communicated to Dr Quinn Axe at 9:09 am on 06/09/2021 by text page via the [facility] messaging system. IMPRESSION: 1. Hyperdense left M2 branch with no hemorrhage or visible acute infarct. 2. Chronic small vessel ischemia with remote left MCA and PCA branch infarcts. Electronically Signed   By: Monte Fantasia M.D.   On: 06/09/2021 09:10   CT  ANGIO HEAD NECK W WO CM W PERF (CODE STROKE)  Addendum Date: 06/09/2021   ADDENDUM REPORT: 06/09/2021 09:32 ADDENDUM: Critical Value/emergent results were called by telephone at the time of interpretation on 06/09/2021 at 9:32 am to provider [facility] , who verbally acknowledged these results. Electronically Signed   By: Monte Fantasia M.D.   On: 06/09/2021 09:32   Result Date: 06/09/2021 CLINICAL DATA:  Stroke suspected. EXAM: CT ANGIOGRAPHY HEAD AND NECK CT PERFUSION BRAIN TECHNIQUE: Multidetector CT imaging of the head and neck was performed using the standard protocol during bolus administration of intravenous contrast. Multiplanar CT image reconstructions and MIPs were obtained to evaluate the vascular anatomy. Carotid stenosis measurements (when applicable) are obtained utilizing NASCET criteria, using the distal internal carotid diameter as the denominator. Multiphase CT imaging of the brain was performed following IV bolus contrast injection. Subsequent parametric perfusion maps were calculated using RAPID software. CONTRAST:  Dose is not currently none on this in progress study. COMPARISON:  Head CT from earlier the same day FINDINGS: CTA NECK FINDINGS Aortic arch: Atheromatous plaque.  Three vessel branching. Right carotid system: Mild plaque at the bifurcation without stenosis or ulceration. Left carotid system: Mild calcified plaque at the bifurcation without stenosis or ulceration Vertebral arteries: No proximal subclavian atherosclerosis. Codominant vertebral arteries that are tortuous but widely patent to the dura. Skeleton: Advanced cervical spine degeneration with particularly severe right-sided C1-2 facet degeneration with bulky spurring impinging on the right C2 nerve root. C2-3 and C3-4 facet ankylosis. Other neck: No acute finding Upper chest: No acute finding Review of the MIP images confirms the above findings CTA HEAD FINDINGS Anterior circulation: Atheromatous plaque at the bilateral  carotid siphon. Distal left M2 branch cut off with some downstream reconstitution. No additional branch occlusion is seen. Negative for aneurysm Posterior circulation: Vertebral and basilar arteries are smoothly contoured and widely patent. No branch occlusion, beading, or aneurysm. Venous sinuses: Diffusely patent Anatomic variants: None significant Review of the MIP images confirms the above findings CT Brain Perfusion Findings: ASPECTS: 10 when accounting for chronic changes CBF (<30%) Volume: 23m Perfusion (Tmax>6.0s) volume: 554mMismatch Volume: 5164m page and call has been placed to neurology. IMPRESSION: 1. Emergent left M2 branch  occlusion with penumbra pattern on CT perfusion involving a 51 cc area. 2. Mild for age atherosclerosis without flow limiting stenosis or visible embolic source. Electronically Signed: By: Monte Fantasia M.D. On: 06/09/2021 09:28    Microbiology: Recent Results (from the past 240 hour(s))  Resp Panel by RT-PCR (Flu A&B, Covid) Nasopharyngeal Swab     Status: None   Collection Time: 07/04/21  8:21 AM   Specimen: Nasopharyngeal Swab; Nasopharyngeal(NP) swabs in vial transport medium  Result Value Ref Range Status   SARS Coronavirus 2 by RT PCR NEGATIVE NEGATIVE Final    Comment: (NOTE) SARS-CoV-2 target nucleic acids are NOT DETECTED.  The SARS-CoV-2 RNA is generally detectable in upper respiratory specimens during the acute phase of infection. The lowest concentration of SARS-CoV-2 viral copies this assay can detect is 138 copies/mL. A negative result does not preclude SARS-Cov-2 infection and should not be used as the sole basis for treatment or other patient management decisions. A negative result may occur with  improper specimen collection/handling, submission of specimen other than nasopharyngeal swab, presence of viral mutation(s) within the areas targeted by this assay, and inadequate number of viral copies(<138 copies/mL). A negative result must be  combined with clinical observations, patient history, and epidemiological information. The expected result is Negative.  Fact Sheet for Patients:  EntrepreneurPulse.com.au  Fact Sheet for Healthcare Providers:  IncredibleEmployment.be  This test is no t yet approved or cleared by the [place] FDA and  has been authorized for detection and/or diagnosis of SARS-CoV-2 by FDA under an Emergency Use Authorization (EUA). This EUA will remain  in effect (meaning this test can be used) for the duration of the COVID-19 declaration under Section 564(b)(1) of the Act, 21 U.S.C.section 360bbb-3(b)(1), unless the authorization is terminated  or revoked sooner.       Influenza A by PCR NEGATIVE NEGATIVE Final   Influenza B by PCR NEGATIVE NEGATIVE Final    Comment: (NOTE) The Xpert Xpress SARS-CoV-2/FLU/RSV plus assay is intended as an aid in the diagnosis of influenza from Nasopharyngeal swab specimens and should not be used as a sole basis for treatment. Nasal washings and aspirates are unacceptable for Xpert Xpress SARS-CoV-2/FLU/RSV testing.  Fact Sheet for Patients: EntrepreneurPulse.com.au  Fact Sheet for Healthcare Providers: IncredibleEmployment.be  This test is not yet approved or cleared by the [place] FDA and has been authorized for detection and/or diagnosis of SARS-CoV-2 by FDA under an Emergency Use Authorization (EUA). This EUA will remain in effect (meaning this test can be used) for the duration of the COVID-19 declaration under Section 564(b)(1) of the Act, 21 U.S.C. section 360bbb-3(b)(1), unless the authorization is terminated or revoked.  Performed at [facility], [place] [address] [place], [place] [address]      Labs: Basic Metabolic Panel: Recent Labs  Lab 06/28/21 0425 06/30/21 0531 07/02/21 0710 07/04/21 0416  NA 139 143 141 139  K 3.9 4.0 4.1 4.3  CL 108 108 105  106  CO2 '24 27 28 25  '$ GLUCOSE 93 104* 92 90  BUN 18 24* 28* 23  CREATININE 0.66 0.82 0.82 0.68  CALCIUM 8.8* 9.2 8.9 8.8*   Liver Function Tests: Recent Labs  Lab 06/29/21 0047  AST 22  ALT 10  ALKPHOS 68  BILITOT 0.7  PROT 6.2*  ALBUMIN 2.9*   No results for input(s): LIPASE, AMYLASE in the last 168 hours. Recent Labs  Lab 06/29/21 0047  AMMONIA 22   CBC: Recent Labs  Lab 06/28/21  0425 06/30/21 0531 07/02/21 0710 07/04/21 0416  WBC 5.2 6.8 8.6 8.5  HGB 12.9 12.7 13.6 13.3  HCT 40.5 39.5 41.1 41.0  MCV 92.0 92.5 91.5 90.7  PLT 453* 556* 502* 392   Cardiac Enzymes: No results for input(s): CKTOTAL, CKMB, CKMBINDEX, TROPONINI in the last 168 hours. BNP: BNP (last 3 results) No results for input(s): BNP in the last 8760 hours.  ProBNP (last 3 results) No results for input(s): PROBNP in the last 8760 hours.  CBG: Recent Labs  Lab 06/30/21 2130  GLUCAP 92       Signed:  Erin Hearing ANP [facility] 07/04/2021, 10:30 AM

## 2021-07-04 NOTE — TOC Transition Note (Addendum)
Transition of Care [facility]) - CM/SW Discharge Note   Patient Details  Name: [name] MRN: [id number] Date of Birth: [birth year]  [facility] CM/SW Contact:  [name], RN Phone Number: 07/04/2021, 8:11 AM   Clinical Narrative:    Case management spoke with [name], CM at [facility] and the facility received insurance authorization approval on 07/03/2021.  COVID screen was placed in the orders and bedside nursing to collect this morning.  The facility is expecting the patient for admission at the facility before lunch today by [facility] transport.  I called and spoke with the patient's daughter-in-law, [name], and she is aware that the patient has been accepted for admission and will be transferred to the facility by [facility] today.   Bedside nursing can call report to [facility] today [phone].  [facility] will be arranged this morning after communicating with the facility and obtaining bed placement and room number.  [facility] was called and arranged for transport to [facility] and the family is aware.  Discharge summary was uploaded in the hub for the receiving facility.   Final next level of care: Skilled Nursing Facility Barriers to Discharge: No Barriers Identified   Patient Goals and CMS Choice Patient states their goals for this hospitalization and ongoing recovery are:: Patient and family are agreeable to SNF placement for rehabilitation. CMS Medicare.gov Compare Post Acute Care list provided to:: Patient Represenative (must comment) ([name] - daughter-in-law) Choice offered to / list presented to : [name] / [name]  Discharge Placement                       Discharge Plan and Services   Discharge Planning Services: CM Consult                                 Social Determinants of Health (SDOH) Interventions     Readmission Risk Interventions No flowsheet data found.

## 2021-07-06 ENCOUNTER — Telehealth: Payer: Self-pay | Admitting: Urology

## 2021-07-06 NOTE — Telephone Encounter (Signed)
[name] would like a phone call back to let her know if she should keep [name] appt scheduled for Wednesday Sept 7, as she has been in the hospital for the last 20 days. She said they have been treating her, but she didn't specify what they have been treating her for.

## 2021-07-06 NOTE — Telephone Encounter (Signed)
Called pt's caregiver back advised her that we will postpone [name] appointment as she is hospitalized and is currently being moved to rehab. Caregiver voiced understanding.

## 2021-07-09 DIAGNOSIS — E785 Hyperlipidemia, unspecified: Secondary | ICD-10-CM | POA: Diagnosis not present

## 2021-07-09 DIAGNOSIS — Z8673 Personal history of transient ischemic attack (TIA), and cerebral infarction without residual deficits: Secondary | ICD-10-CM | POA: Diagnosis not present

## 2021-07-09 DIAGNOSIS — N319 Neuromuscular dysfunction of bladder, unspecified: Secondary | ICD-10-CM | POA: Diagnosis not present

## 2021-07-09 DIAGNOSIS — R69 Illness, unspecified: Secondary | ICD-10-CM | POA: Diagnosis not present

## 2021-07-10 ENCOUNTER — Ambulatory Visit: Payer: Medicare HMO | Admitting: Neurology

## 2021-07-11 ENCOUNTER — Ambulatory Visit: Payer: Self-pay | Admitting: Urology

## 2021-07-11 DIAGNOSIS — Z8673 Personal history of transient ischemic attack (TIA), and cerebral infarction without residual deficits: Secondary | ICD-10-CM | POA: Diagnosis not present

## 2021-07-11 DIAGNOSIS — R69 Illness, unspecified: Secondary | ICD-10-CM | POA: Diagnosis not present

## 2021-07-11 DIAGNOSIS — M810 Age-related osteoporosis without current pathological fracture: Secondary | ICD-10-CM | POA: Diagnosis not present

## 2021-07-11 DIAGNOSIS — G309 Alzheimer's disease, unspecified: Secondary | ICD-10-CM | POA: Diagnosis not present

## 2021-07-16 DIAGNOSIS — R69 Illness, unspecified: Secondary | ICD-10-CM | POA: Diagnosis not present

## 2021-07-16 DIAGNOSIS — Z8673 Personal history of transient ischemic attack (TIA), and cerebral infarction without residual deficits: Secondary | ICD-10-CM | POA: Diagnosis not present

## 2021-07-16 DIAGNOSIS — N319 Neuromuscular dysfunction of bladder, unspecified: Secondary | ICD-10-CM | POA: Diagnosis not present

## 2021-07-22 DIAGNOSIS — M6281 Muscle weakness (generalized): Secondary | ICD-10-CM | POA: Diagnosis not present

## 2021-07-23 ENCOUNTER — Ambulatory Visit (INDEPENDENT_AMBULATORY_CARE_PROVIDER_SITE_OTHER): Payer: Medicare HMO | Admitting: Licensed Clinical Social Worker

## 2021-07-23 DIAGNOSIS — F0390 Unspecified dementia without behavioral disturbance: Secondary | ICD-10-CM

## 2021-07-23 DIAGNOSIS — I10 Essential (primary) hypertension: Secondary | ICD-10-CM

## 2021-07-23 DIAGNOSIS — I4821 Permanent atrial fibrillation: Secondary | ICD-10-CM

## 2021-07-23 DIAGNOSIS — R413 Other amnesia: Secondary | ICD-10-CM

## 2021-07-23 NOTE — Patient Instructions (Addendum)
Visit Information   Goals Addressed   None     Patient verbalizes understanding of instructions provided today and agrees to view in [place].   Telephone follow up appointment with care management team member scheduled for:09/17/21  Christa See, MSW, [email or web address] Phone [phone] 1:14 PM

## 2021-07-25 NOTE — Chronic Care Management (AMB) (Signed)
Chronic Care Management    Clinical Social Work Note  07/25/2021 Name: [name] MRN: [id number] DOB: 02-25-1941  [name] is a 80 y.o. year old female who is a primary care patient of Lisa Roys, DO. The CCM team was consulted to assist the patient with chronic disease management and/or care coordination needs related to: Level of Care Concerns and Caregiver Stress.   Engaged with patient's daughter in law by telephone for follow up visit in response to provider referral for social work chronic care management and care coordination services.   Consent to Services:  The patient was given information about Chronic Care Management services, agreed to services, and gave verbal consent prior to initiation of services.  Please see initial visit note for detailed documentation.   Patient agreed to services and consent obtained.   Consent to Services:  The patient was given information about Care Management services, agreed to services, and gave verbal consent prior to initiation of services.  Please see initial visit note for detailed documentation.   Patient agreed to services today and consent obtained.   Assessment: Engaged with patient's daughter in law by phone in response to provider referral for social work care coordination services: Level of Care Concerns and Caregiver Stress.    Patient continues to maintain positive progress with care plan goals. Due to recent stroke resulting in emergency surgery, patient has been placed at [name]; however, she was discharged from services 07/21/21. Family are paying out of pocket and are actively searching for [facility] with [name]. Family continues to visit daily. See Care Plan below for interventions and patient self-care actives.  Recent life changes or stressors: Placement in a SNF  Recommendation: Patient may benefit from, and is in agreement work with [name] to address care coordination needs and will  continue to work with the clinical team to address health care and disease management related needs.   Follow up Plan: Patient would like continued follow-up from CCM [name] .  per patient's request will follow up in 09/17/21.  Will call office if needed prior to next encounter.     SDOH (Social Determinants of Health) assessments and interventions performed:    Advanced Directives Status: Not addressed in this encounter.  CCM Care Plan  Allergies  Allergen Reactions   Amlodipine Other (See Comments)    Leg swelling   Haldol [Haloperidol Lactate] Other (See Comments)    Neuroleptic Malignant syndrome    Hctz [Hydrochlorothiazide] Nausea Only and Rash    Outpatient Encounter Medications as of 07/23/2021  Medication Sig   acetaminophen (TYLENOL) 325 MG tablet Take 2 tablets (650 mg total) by mouth every 4 (four) hours as needed for mild pain (or temp > 37.5 C (99.5 F)).   atorvastatin (LIPITOR) 40 MG tablet Take 1 tablet (40 mg total) by mouth at bedtime.   clonazepam (KLONOPIN) 0.125 MG disintegrating tablet Take 1 tablet (0.125 mg total) by mouth 2 (two) times daily.   divalproex (DEPAKOTE SPRINKLE) 125 MG capsule Take 2 capsules (250 mg total) by mouth every 12 (twelve) hours.   propranolol (INDERAL) 10 MG tablet Take 1 tablet (10 mg total) by mouth 3 (three) times daily.   QUEtiapine (SEROQUEL) 50 MG tablet Take 1 tablet (50 mg total) by mouth 3 (three) times daily.   rivaroxaban (XARELTO) 20 MG TABS tablet Take 1 tablet (20 mg total) by mouth daily with supper.   senna-docusate (SENOKOT-S) 8.6-50 MG tablet Take 1 tablet  by mouth at bedtime as needed for moderate constipation or mild constipation.   tamsulosin (FLOMAX) 0.4 MG CAPS capsule Take 1 capsule (0.4 mg total) by mouth daily after supper.   No facility-administered encounter medications on file as of 07/23/2021.    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   Acute ischemic stroke [facility])    Memory loss 05/28/2021   Dementia without behavioral disturbance ([place]) 05/28/2021   Neuroleptic malignant syndrome 04/01/2021   History of CVA with residual deficit 03/30/2021   Acute confusion 03/29/2021   Delirium 03/29/2021   Cellulitis 03/29/2021   Chronic venous insufficiency 10/14/2016   Atrial fibrillation ([place]) 06/15/2015   Rash 06/15/2015   HTN (hypertension) 04/11/2015   Osteoarthritis 04/11/2015    Conditions to be addressed/monitored: Dementia; Level of care concerns, Cognitive Deficits, Memory Deficits, Inability to perform ADL's independently, and Inability to perform IADL's independently  Care Plan : General Social Work (Adult)  Updates made by Lisa Chesterfield, [name] since 07/25/2021 12:00 AM     Problem: Quality of Life (General Plan of Care)      Goal: Quality of Life Maintained   Start Date: 04/26/2021  This Visit's Progress: On track  Recent Progress: On track  Priority: High  Note:   Current barriers:    Level of care concerns, ADL IADL limitations, Family and relationship dysfunction, Memory Deficits, Inability to perform ADL's independently, and Inability to perform IADL's independently Clinical Goals: Patient will work with CCM [name] to address needs related to strengthening support to assist with unmet needs Clinical Interventions:  Assessment of needs, barriers , agencies contacted, as well as how impacting health Patient's daughter in law, [name] provided all hx during call Patient had another stroke resulting in emergency surgery. She has been dismissed her from rehab at [name] on the 07/21/21 due to patient's cognitive abilities not improving, in addition, to concerns that she doesn't follow commands. Family shared that they have not observed any improvement while  patient has been a resident and they are not happy about services provided at facility  Family are currently hoping that patient is transferred to [name] or [facility]. Once transferred, they will request patient to be re-assessed for PT. Daughter in law shared that their goal is to have patient return home, when she becomes stronger. DIL will follow up with [name] today regarding requested paperwork and [name] (won't hear anything until open bed)  Family visits patient daily CCM [name] commended family on continuing to advocate for patient to ensure she receives appropriate services and care Patient was discharged from [facility] this afternoon. She will remain with her son and daughter in law; however, she doesn't fully understand due to cognitive impairment. Per daughter in law, sometimes patient retains and understands information and other times not. There are times when patient becomes upset easily so family has been mindful of how to relay information to her Patient was unable to attend Neurologist appointment scheduled on 05/08/21 with [facility] due to [place]. DIL shared that [name]'s transportation service did not bring patient to appointment timely, resulting in cancellation. She has called approximately 4 different Neurologists; however, the soonest appointments were scheduled with [facility] for 07/30/21 and [facility] 07/10/21 Patient is on the wait-lists for both specialists, in case of  any cancellations Family continues to re-direct patient often by changing subject. They try not confront patient's delusions Patient has been enjoying watching tv and spending time with family Family are working on obtaining a sitter to assist with supervision of patient while DIL works and/or runs errands. CCM [name] provided options through [employer], provided through email. Family will also reach out to family and friends for assistance and additional support CCM [name] discussed strategies to promote patient  safety while in their home. Family plans on getting alarms on door to assist with monitoring patient to prevent wandering Patient's eldest son and [name] went to court on 04/24/21 to file for Guardianship for concerns regarding patient's care under the supervision of her daughter. Per [name], patient was not taking medications and her medical needs were not made a priority, which resulted in recent hospitalization. They were encouraged to obtain a bond regarding patient's assets. Once obtained, they will provide paperwork to court to finalize Guardianship. Patient's son will be th guardian of estate and he and [name] will be co-guardians of patient's medical care. Per the agreement, patient's son will have to update his siblings weekly and provide them with patient's medical records CCM [name] reviewed upcoming appointments Collaboration with Lisa Roys, DO regarding development and update of comprehensive plan of care as evidenced by provider attestation and co-signature Inter-disciplinary care team collaboration (see longitudinal plan of care) Discussed plans with patient for ongoing care management follow up and provided patient with direct contact information for care management team Clinical interventions provided:Solution-Focused Strategies, Active listening / Reflection utilized , Emotional Supportive Provided, Caregiver stress acknowledged , Verbalization of feelings encouraged , Discussed Guardianship and reviewed process , and [place] of Attorney  Patient Goals/Self-Care Activities: Over the next 120 days Attend all scheduled appointments with provider Contact clinic with any questions or concerns Utilize resources provided          Christa See, MSW, [email or web address] Phone [phone] 8:18 AM

## 2021-07-29 DIAGNOSIS — E785 Hyperlipidemia, unspecified: Secondary | ICD-10-CM | POA: Diagnosis not present

## 2021-07-29 DIAGNOSIS — R69 Illness, unspecified: Secondary | ICD-10-CM | POA: Diagnosis not present

## 2021-07-29 DIAGNOSIS — N319 Neuromuscular dysfunction of bladder, unspecified: Secondary | ICD-10-CM | POA: Diagnosis not present

## 2021-07-29 DIAGNOSIS — Z8673 Personal history of transient ischemic attack (TIA), and cerebral infarction without residual deficits: Secondary | ICD-10-CM | POA: Diagnosis not present

## 2021-07-29 DIAGNOSIS — I10 Essential (primary) hypertension: Secondary | ICD-10-CM | POA: Diagnosis not present

## 2021-07-30 ENCOUNTER — Ambulatory Visit: Payer: Medicare HMO | Admitting: Neurology

## 2021-08-03 DIAGNOSIS — I4821 Permanent atrial fibrillation: Secondary | ICD-10-CM

## 2021-08-03 DIAGNOSIS — I10 Essential (primary) hypertension: Secondary | ICD-10-CM | POA: Diagnosis not present

## 2021-08-03 DIAGNOSIS — F0390 Unspecified dementia without behavioral disturbance: Secondary | ICD-10-CM

## 2021-08-03 DIAGNOSIS — R69 Illness, unspecified: Secondary | ICD-10-CM | POA: Diagnosis not present

## 2021-08-29 ENCOUNTER — Ambulatory Visit: Payer: Medicare HMO | Admitting: Family Medicine

## 2021-09-11 ENCOUNTER — Ambulatory Visit (INDEPENDENT_AMBULATORY_CARE_PROVIDER_SITE_OTHER): Payer: Medicare HMO | Admitting: Licensed Clinical Social Worker

## 2021-09-11 DIAGNOSIS — I872 Venous insufficiency (chronic) (peripheral): Secondary | ICD-10-CM | POA: Diagnosis not present

## 2021-09-11 DIAGNOSIS — Z8673 Personal history of transient ischemic attack (TIA), and cerebral infarction without residual deficits: Secondary | ICD-10-CM | POA: Diagnosis not present

## 2021-09-11 DIAGNOSIS — F0390 Unspecified dementia without behavioral disturbance: Secondary | ICD-10-CM

## 2021-09-11 DIAGNOSIS — R413 Other amnesia: Secondary | ICD-10-CM

## 2021-09-11 DIAGNOSIS — I10 Essential (primary) hypertension: Secondary | ICD-10-CM

## 2021-09-11 DIAGNOSIS — N319 Neuromuscular dysfunction of bladder, unspecified: Secondary | ICD-10-CM | POA: Diagnosis not present

## 2021-09-11 DIAGNOSIS — R69 Illness, unspecified: Secondary | ICD-10-CM | POA: Diagnosis not present

## 2021-09-11 DIAGNOSIS — I4821 Permanent atrial fibrillation: Secondary | ICD-10-CM

## 2021-09-12 ENCOUNTER — Ambulatory Visit: Payer: Medicare HMO | Admitting: Podiatry

## 2021-09-12 DIAGNOSIS — Z8673 Personal history of transient ischemic attack (TIA), and cerebral infarction without residual deficits: Secondary | ICD-10-CM | POA: Diagnosis not present

## 2021-09-12 DIAGNOSIS — R69 Illness, unspecified: Secondary | ICD-10-CM | POA: Diagnosis not present

## 2021-09-12 DIAGNOSIS — G309 Alzheimer's disease, unspecified: Secondary | ICD-10-CM | POA: Diagnosis not present

## 2021-09-12 NOTE — Patient Instructions (Addendum)
Visit Information  Patient Goals/Self-Care Activities: Over the next 120 days Attend all scheduled appointments with provider Contact clinic with any questions or concerns Utilize resources provided  Patient verbalizes understanding of instructions provided today and agrees to view in [place].   Telephone follow up appointment with care management team member scheduled for:09/17/21  Christa See, MSW, [email or web address] Phone [phone] 2:35 PM

## 2021-09-12 NOTE — Chronic Care Management (AMB) (Signed)
Chronic Care Management    Clinical Social Work Note  09/12/2021 Name: [name] MRN: [id number] DOB: [birth year]  [name] is a 80 y.o. year old female who is a primary care patient of Lisa Roys, DO. The CCM team was consulted to assist the patient with chronic disease management and/or care coordination needs related to: Level of Care Concerns, Mental Health Counseling and Resources, and Caregiver Stress.   Engaged with patient's daughter in law, [name], by telephone for follow up visit in response to provider referral for social work chronic care management and care coordination services.   Consent to Services:  The patient was given information about Chronic Care Management services, agreed to services, and gave verbal consent prior to initiation of services.  Please see initial visit note for detailed documentation.   Patient agreed to services and consent obtained.   Consent to Services:  The patient was given information about Care Management services, agreed to services, and gave verbal consent prior to initiation of services.  Please see initial visit note for detailed documentation.   Patient agreed to services today and consent obtained.  Engaged with patient by phone in response to provider referral for social work care coordination services:  Assessment/Interventions: Patient's daughter in law provided all information during this encounter. She shared that patient is still placed at [facility]; however, family aren't satisfied with services provided and are paying out of pocket. [name] will schedule follow up appt with PCP to further discuss home care or palliative care services.  See Care Plan below for interventions and patient self-care activities.  Recent life changes or stressors: Management of health conditions  Recommendation: Patient may benefit from, and is in agreement work with [name] to address care coordination needs and will continue to work with the  clinical team to address health care and disease management related needs.   Follow up Plan: Patient would like continued follow-up from CCM [name] .  per patient's request will follow up in 09/17/21.  Will call office if needed prior to next encounter.  SDOH (Social Determinants of Health) assessments and interventions performed:    Advanced Directives Status: Not addressed in this encounter.  CCM Care Plan  Allergies  Allergen Reactions   Amlodipine Other (See Comments)    Leg swelling   Haldol [Haloperidol Lactate] Other (See Comments)    Neuroleptic Malignant syndrome    Hctz [Hydrochlorothiazide] Nausea Only and Rash    Outpatient Encounter Medications as of 09/11/2021  Medication Sig   acetaminophen (TYLENOL) 325 MG tablet Take 2 tablets (650 mg total) by mouth every 4 (four) hours as needed for mild pain (or temp > 37.5 C (99.5 F)).   atorvastatin (LIPITOR) 40 MG tablet Take 1 tablet (40 mg total) by mouth at bedtime.   clonazepam (KLONOPIN) 0.125 MG disintegrating tablet Take 1 tablet (0.125 mg total) by mouth 2 (two) times daily.   divalproex (DEPAKOTE SPRINKLE) 125 MG capsule Take 2 capsules (250 mg total) by mouth every 12 (twelve) hours.   propranolol (INDERAL) 10 MG tablet Take 1 tablet (10 mg total) by mouth 3 (three) times daily.   QUEtiapine (SEROQUEL) 50 MG tablet Take 1 tablet (50 mg total) by mouth 3 (three) times daily.   rivaroxaban (XARELTO) 20 MG TABS tablet Take 1 tablet (20 mg total) by mouth daily with supper.   senna-docusate (SENOKOT-S) 8.6-50 MG tablet Take 1 tablet by mouth at bedtime as needed for moderate constipation or mild constipation.  tamsulosin (FLOMAX) 0.4 MG CAPS capsule Take 1 capsule (0.4 mg total) by mouth daily after supper.   No facility-administered encounter medications on file as of 09/11/2021.    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   Acute ischemic stroke [facility])    Memory loss 05/28/2021   Dementia without behavioral disturbance ([place]) 05/28/2021   Neuroleptic malignant syndrome 04/01/2021   History of CVA with residual deficit 03/30/2021   Acute confusion 03/29/2021   Delirium 03/29/2021   Cellulitis 03/29/2021   Chronic venous insufficiency 10/14/2016   Atrial fibrillation ([place]) 06/15/2015   Rash 06/15/2015   HTN (hypertension) 04/11/2015   Osteoarthritis 04/11/2015    Conditions to be addressed/monitored: Dementia; Level of care concerns, Memory Deficits, Inability to perform ADL's independently, and Inability to perform IADL's independently  Care Plan : General Social Work (Adult)  Updates made by Rebekah Chesterfield, [name] since 09/12/2021 12:00 AM     Problem: Quality of Life (General Plan of Care)      Goal: Quality of Life Maintained   Start Date: 04/26/2021  This Visit's Progress: On track  Recent Progress: On track  Priority: High  Note:   Current barriers:    Level of care concerns, ADL IADL limitations, Family and relationship dysfunction, Memory Deficits, Inability to perform ADL's independently, and Inability to perform IADL's independently Clinical Goals: Patient will work with CCM [name] to address needs related to strengthening support to assist with unmet needs Clinical Interventions:  Assessment of needs, barriers , agencies contacted, as well as how impacting health Patient's daughter in law, [name] provided all hx during call Patient had another stroke resulting in emergency surgery. She has been dismissed her from rehab at [facility] on the 07/21/21 due to patient's cognitive abilities not improving, in addition, to concerns that she doesn't follow commands. Family shared that they have not observed any improvement while patient has been a resident and they are not happy about  services provided at facility 11/08: Patient is still admitted at [facility]; however, insurance is not covering expenses due to reports that patient's progress is not improving. Family are not satisfied with the services provided in the facility and are thinking about discharging patient home with home care. [name] has obtained information on Palliative Care through [employer]  Patient's daughter in law visits every Monday. [name] has a friend who is a nurse visit patient on Tuesdays and Thursdays to work with improving mobility. [name] plans to schedule a follow up appt with PCP to discuss next steps Family are currently hoping that patient is transferred to [facility] or [facility]. Once transferred, they will request patient to be re-assessed for PT. Daughter in law shared that their goal is to have patient return home, when she becomes stronger. DIL will follow up with [facility] today regarding requested paperwork and [facility] (won't hear anything until open bed) 11/8: Other facilities will not admit patient due to high level of care. Patient would need to be capable of transitioning legs to the side of the bed Family visits patient daily CCM [name] commended family on continuing to advocate for patient to ensure she receives appropriate services and care Patient was discharged from [facility] this afternoon. She will remain with her son and daughter in law; however,  she doesn't fully understand due to cognitive impairment. Per daughter in law, sometimes patient retains and understands information and other times not. There are times when patient becomes upset easily so family has been mindful of how to relay information to her Patient was unable to attend Neurologist appointment scheduled on [facility]/08/21 with [facility] due to [place]. DIL shared that [facility]'s transportation service did not bring patient to appointment timely, resulting in cancellation. She has called approximately 4 different Neurologists;  however, the soonest appointments were scheduled with [facility] for 07/30/21 and [facility] 07/10/21 Patient is on the wait-lists for both specialists, in case of any cancellations Family continues to re-direct patient often by changing subject. They try not confront patient's delusions Patient has been enjoying watching tv and spending time with family Family are working on obtaining a sitter to assist with supervision of patient while DIL works and/or runs errands. CCM [name] provided options through [employer], provided through email. Family will also reach out to family and friends for assistance and additional support CCM [name] discussed strategies to promote patient safety while in their home. Family plans on getting alarms on door to assist with monitoring patient to prevent wandering Patient's eldest son and [name] went to court on 04/24/21 to file for Guardianship for concerns regarding patient's care under the supervision of her daughter. Per [name], patient was not taking medications and her medical needs were not made a priority, which resulted in recent hospitalization. They were encouraged to obtain a bond regarding patient's assets. Once obtained, they will provide paperwork to court to finalize Guardianship. Patient's son will be th guardian of estate and he and [name] will be co-guardians of patient's medical care. Per the agreement, patient's son will have to update his siblings weekly and provide them with patient's medical records CCM [name] reviewed upcoming appointments Collaboration with Lisa Roys, DO regarding development and update of comprehensive plan of care as evidenced by provider attestation and co-signature Inter-disciplinary care team collaboration (see longitudinal plan of care) Discussed plans with patient for ongoing care management follow up and provided patient with direct contact information for care management team Clinical interventions  provided:Solution-Focused Strategies, Active listening / Reflection utilized , Emotional Supportive Provided, Caregiver stress acknowledged , Verbalization of feelings encouraged , Discussed Guardianship and reviewed process , and [place] of Attorney  Patient Goals/Self-Care Activities: Over the next 120 days Attend all scheduled appointments with provider Contact clinic with any questions or concerns Utilize resources provided         Christa See, MSW, [email or web address] Phone [phone] 2:49 PM

## 2021-09-13 DIAGNOSIS — E785 Hyperlipidemia, unspecified: Secondary | ICD-10-CM | POA: Diagnosis not present

## 2021-09-13 DIAGNOSIS — I10 Essential (primary) hypertension: Secondary | ICD-10-CM | POA: Diagnosis not present

## 2021-09-13 DIAGNOSIS — R339 Retention of urine, unspecified: Secondary | ICD-10-CM | POA: Diagnosis not present

## 2021-09-13 DIAGNOSIS — R69 Illness, unspecified: Secondary | ICD-10-CM | POA: Diagnosis not present

## 2021-09-13 DIAGNOSIS — I872 Venous insufficiency (chronic) (peripheral): Secondary | ICD-10-CM | POA: Diagnosis not present

## 2021-09-13 DIAGNOSIS — M25562 Pain in left knee: Secondary | ICD-10-CM | POA: Diagnosis not present

## 2021-09-13 DIAGNOSIS — I69398 Other sequelae of cerebral infarction: Secondary | ICD-10-CM | POA: Diagnosis not present

## 2021-09-13 DIAGNOSIS — G210 Malignant neuroleptic syndrome: Secondary | ICD-10-CM | POA: Diagnosis not present

## 2021-09-13 DIAGNOSIS — I4891 Unspecified atrial fibrillation: Secondary | ICD-10-CM | POA: Diagnosis not present

## 2021-09-13 DIAGNOSIS — M1990 Unspecified osteoarthritis, unspecified site: Secondary | ICD-10-CM | POA: Diagnosis not present

## 2021-09-17 ENCOUNTER — Ambulatory Visit: Payer: Medicare HMO | Admitting: Licensed Clinical Social Worker

## 2021-09-17 DIAGNOSIS — I10 Essential (primary) hypertension: Secondary | ICD-10-CM

## 2021-09-17 DIAGNOSIS — I4821 Permanent atrial fibrillation: Secondary | ICD-10-CM

## 2021-09-17 DIAGNOSIS — R413 Other amnesia: Secondary | ICD-10-CM

## 2021-09-17 DIAGNOSIS — F0390 Unspecified dementia without behavioral disturbance: Secondary | ICD-10-CM

## 2021-09-17 DIAGNOSIS — I6930 Unspecified sequelae of cerebral infarction: Secondary | ICD-10-CM

## 2021-09-17 NOTE — Patient Instructions (Signed)
Visit Information  Patient Goals/Self-Care Activities: Over the next 120 days Attend all scheduled appointments with provider Contact clinic with any questions or concerns Utilize resources provided  Patient verbalizes understanding of instructions provided today and agrees to view in [place].   Telephone follow up appointment with care management team member scheduled for:10/15/21  Christa See, MSW, [email or web address] Phone [phone] 3:33 PM

## 2021-09-17 NOTE — Chronic Care Management (AMB) (Signed)
Chronic Care Management    Clinical Social Work Note  09/17/2021 Name: [name] MRN: [id number] DOB: 31-Jul-1941  [name] is a 80 y.o. year old female who is a primary care patient of [name], DO. The CCM team was consulted to assist the patient with chronic disease management and/or care coordination needs related to: Mental Health Counseling and Resources and Caregiver Stress.   Engaged with patient's daughter in law by telephone for follow up visit in response to provider referral for social work chronic care management and care coordination services.   Consent to Services:  The patient was given information about Chronic Care Management services, agreed to services, and gave verbal consent prior to initiation of services.  Please see initial visit note for detailed documentation.   Patient agreed to services and consent obtained.   Consent to Services:  The patient was given information about Care Management services, agreed to services, and gave verbal consent prior to initiation of services.  Please see initial visit note for detailed documentation.   Patient agreed to services today and consent obtained.  Engaged with patient's daughter in law, [name] by phone in response to provider referral for social work care coordination services:  Assessment/Interventions:  Patient continues to maintain positive progress with care plan goals. This week, patient has increased mobility and may be ready for physical therapy. CCM [name] discussed various in and out of home options to assist with providing care. Family will speak with facility's SW to discuss referrals.  See Care Plan below for interventions and patient self-care activites.  Recent life changes or stressors: Management of health conditions  Recommendation: Patient may benefit from, and is in agreement work with [name] to address care coordination needs and will continue to work with the clinical team to address health  care and disease management related needs.   Follow up Plan: Patient would like continued follow-up from CCM [name] .  per patient's request will follow up in 12/12/2.  Will call office if needed prior to next encounter.     SDOH (Social Determinants of Health) assessments and interventions performed:    Advanced Directives Status: Not addressed in this encounter.  CCM Care Plan  Allergies  Allergen Reactions   Amlodipine Other (See Comments)    Leg swelling   Haldol [Haloperidol Lactate] Other (See Comments)    Neuroleptic Malignant syndrome    Hctz [Hydrochlorothiazide] Nausea Only and Rash    Outpatient Encounter Medications as of 09/17/2021  Medication Sig   acetaminophen (TYLENOL) 325 MG tablet Take 2 tablets (650 mg total) by mouth every 4 (four) hours as needed for mild pain (or temp > 37.5 C (99.5 F)).   atorvastatin (LIPITOR) 40 MG tablet Take 1 tablet (40 mg total) by mouth at bedtime.   clonazepam (KLONOPIN) 0.125 MG disintegrating tablet Take 1 tablet (0.125 mg total) by mouth 2 (two) times daily.   divalproex (DEPAKOTE SPRINKLE) 125 MG capsule Take 2 capsules (250 mg total) by mouth every 12 (twelve) hours.   propranolol (INDERAL) 10 MG tablet Take 1 tablet (10 mg total) by mouth 3 (three) times daily.   QUEtiapine (SEROQUEL) 50 MG tablet Take 1 tablet (50 mg total) by mouth 3 (three) times daily.   rivaroxaban (XARELTO) 20 MG TABS tablet Take 1 tablet (20 mg total) by mouth daily with supper.   senna-docusate (SENOKOT-S) 8.6-50 MG tablet Take 1 tablet by mouth at bedtime as needed for moderate constipation or mild constipation.  tamsulosin (FLOMAX) 0.4 MG CAPS capsule Take 1 capsule (0.4 mg total) by mouth daily after supper.   No facility-administered encounter medications on file as of 09/17/2021.    Patient Active Problem List   Diagnosis Date Noted   Ambulatory dysfunction    Agitation due to dementia 06/11/2021   Stroke ([place]) 06/09/2021   Acute ischemic left  MCA stroke ([place]) 06/09/2021   Middle cerebral artery embolism, left 06/09/2021   Acute ischemic stroke [facility])    Memory loss 05/28/2021   Dementia without behavioral disturbance ([place]) 05/28/2021   Neuroleptic malignant syndrome 04/01/2021   History of CVA with residual deficit 03/30/2021   Acute confusion 03/29/2021   Delirium 03/29/2021   Cellulitis 03/29/2021   Chronic venous insufficiency 10/14/2016   Atrial fibrillation ([place]) 06/15/2015   Rash 06/15/2015   HTN (hypertension) 04/11/2015   Osteoarthritis 04/11/2015    Conditions to be addressed/monitored: Dementia  Care Plan : General Social Work (Adult)  Updates made by [name], [name] since 09/17/2021 12:00 AM     Problem: Quality of Life (General Plan of Care)      Goal: Quality of Life Maintained   Start Date: 04/26/2021  This Visit's Progress: On track  Recent Progress: On track  Priority: High  Note:   Current barriers:    Level of care concerns, ADL IADL limitations, Family and relationship dysfunction, Memory Deficits, Inability to perform ADL's independently, and Inability to perform IADL's independently Clinical Goals: Patient will work with CCM [name] to address needs related to strengthening support to assist with unmet needs Clinical Interventions:  Assessment of needs, barriers , agencies contacted, as well as how impacting health Patient's daughter in law, [name] provided all hx during call Patient had another stroke resulting in emergency surgery. She has been dismissed her from rehab at [facility] on the 07/21/21 due to patient's cognitive abilities not improving, in addition, to concerns that she doesn't follow commands. Family shared that they have not observed any improvement while patient has been a resident and they are not happy about services provided at facility 11/08: Patient is still admitted at [facility]; however, insurance is not covering expenses due to reports that patient's progress is  not improving. Family are not satisfied with the services provided in the facility and are thinking about discharging patient home with home care. [name] has obtained information on Palliative Care through [employer] 11/14: Patient currently has increased her mobility and strength in her core, legs, and arms. Patient's motivation increased to participate in physical therapy and has increased time spent outside the room. Her personality has re-surfaced Patient's daughter in law visits every Monday. [name] has a friend who is a nurse visit patient on Tuesdays and Thursdays to work with improving mobility. [name] plans to schedule a follow up appt with PCP to discuss next steps 11/14: Pt's DIL will follow up with the social worker at [facility] regarding referral to palliative care and re-assessment of physical therapy Family are currently hoping that patient is transferred to [facility] or [facility]. Once transferred, they will request patient to be re-assessed for PT. Daughter in law shared that their goal is to have patient return home, when she becomes stronger. DIL will follow up with [place] today regarding requested paperwork and [facility] (won't hear anything until open bed) 11/8: Other facilities will not admit patient due to high level of care. Patient would need to be capable of transitioning legs to the side of the bed  Family visits patient daily CCM [name] commended family on continuing to advocate for patient to ensure she receives appropriate services and care Patient was discharged from [place] [facility] this afternoon. She will remain with her son and daughter in law; however, she doesn't fully understand due to cognitive impairment. Per daughter in law, sometimes patient retains and understands information and other times not. There are times when patient becomes upset easily so family has been mindful of how to relay information to her Patient was unable to attend Neurologist appointment  scheduled on [facility]/08/21 with [facility] due to [place]. DIL shared that [facility]'s transportation service did not bring patient to appointment timely, resulting in cancellation. She has called approximately 4 different Neurologists; however, the soonest appointments were scheduled with [facility] for 07/30/21 and [facility] 07/10/21 Patient is on the wait-lists for both specialists, in case of any cancellations Family continues to re-direct patient often by changing subject. They try not confront patient's delusions Patient has been enjoying watching tv and spending time with family Family are working on obtaining a sitter to assist with supervision of patient while DIL works and/or runs errands. CCM [name] provided options through [employer], provided through email. Family will also reach out to family and friends for assistance and additional support CCM [name] discussed strategies to promote patient safety while in their home. Family plans on getting alarms on door to assist with monitoring patient to prevent wandering Patient's eldest son and [name] went to court on 04/24/21 to file for Guardianship for concerns regarding patient's care under the supervision of her daughter. Per [name], patient was not taking medications and her medical needs were not made a priority, which resulted in recent hospitalization. They were encouraged to obtain a bond regarding patient's assets. Once obtained, they will provide paperwork to court to finalize Guardianship. Patient's son will be th guardian of estate and he and [name] will be co-guardians of patient's medical care. Per the agreement, patient's son will have to update his siblings weekly and provide them with patient's medical records CCM [name] reviewed upcoming appointments Collaboration with [name], DO regarding development and update of comprehensive plan of care as evidenced by provider attestation and co-signature Inter-disciplinary care team collaboration  (see longitudinal plan of care) Discussed plans with patient for ongoing care management follow up and provided patient with direct contact information for care management team Clinical interventions provided:Solution-Focused Strategies, Active listening / Reflection utilized , Emotional Supportive Provided, Caregiver stress acknowledged , Verbalization of feelings encouraged , Discussed Guardianship and reviewed process , and [place] of Attorney  Patient Goals/Self-Care Activities: Over the next 120 days Attend all scheduled appointments with provider Contact clinic with any questions or concerns Utilize resources provided          Christa See, MSW, [email or web address] Phone [phone] 3:29 PM

## 2021-09-19 DIAGNOSIS — M1990 Unspecified osteoarthritis, unspecified site: Secondary | ICD-10-CM | POA: Diagnosis not present

## 2021-09-19 DIAGNOSIS — R69 Illness, unspecified: Secondary | ICD-10-CM | POA: Diagnosis not present

## 2021-09-19 DIAGNOSIS — E785 Hyperlipidemia, unspecified: Secondary | ICD-10-CM | POA: Diagnosis not present

## 2021-09-19 DIAGNOSIS — I4891 Unspecified atrial fibrillation: Secondary | ICD-10-CM | POA: Diagnosis not present

## 2021-09-19 DIAGNOSIS — I10 Essential (primary) hypertension: Secondary | ICD-10-CM | POA: Diagnosis not present

## 2021-09-19 DIAGNOSIS — I872 Venous insufficiency (chronic) (peripheral): Secondary | ICD-10-CM | POA: Diagnosis not present

## 2021-09-21 DIAGNOSIS — M6281 Muscle weakness (generalized): Secondary | ICD-10-CM | POA: Diagnosis not present

## 2021-09-24 DIAGNOSIS — M6281 Muscle weakness (generalized): Secondary | ICD-10-CM | POA: Diagnosis not present

## 2021-09-25 DIAGNOSIS — M6281 Muscle weakness (generalized): Secondary | ICD-10-CM | POA: Diagnosis not present

## 2021-09-26 DIAGNOSIS — M6281 Muscle weakness (generalized): Secondary | ICD-10-CM | POA: Diagnosis not present

## 2021-09-27 DIAGNOSIS — M6281 Muscle weakness (generalized): Secondary | ICD-10-CM | POA: Diagnosis not present

## 2021-09-28 DIAGNOSIS — M6281 Muscle weakness (generalized): Secondary | ICD-10-CM | POA: Diagnosis not present

## 2021-10-01 DIAGNOSIS — M6281 Muscle weakness (generalized): Secondary | ICD-10-CM | POA: Diagnosis not present

## 2021-10-03 DIAGNOSIS — I4821 Permanent atrial fibrillation: Secondary | ICD-10-CM

## 2021-10-03 DIAGNOSIS — F0390 Unspecified dementia without behavioral disturbance: Secondary | ICD-10-CM

## 2021-10-03 DIAGNOSIS — I10 Essential (primary) hypertension: Secondary | ICD-10-CM

## 2021-10-03 DIAGNOSIS — M6281 Muscle weakness (generalized): Secondary | ICD-10-CM | POA: Diagnosis not present

## 2021-10-04 DIAGNOSIS — R768 Other specified abnormal immunological findings in serum: Secondary | ICD-10-CM | POA: Diagnosis not present

## 2021-10-04 DIAGNOSIS — R69 Illness, unspecified: Secondary | ICD-10-CM | POA: Diagnosis not present

## 2021-10-04 DIAGNOSIS — M6281 Muscle weakness (generalized): Secondary | ICD-10-CM | POA: Diagnosis not present

## 2021-10-05 DIAGNOSIS — M6281 Muscle weakness (generalized): Secondary | ICD-10-CM | POA: Diagnosis not present

## 2021-10-08 ENCOUNTER — Other Ambulatory Visit: Payer: Self-pay

## 2021-10-08 DIAGNOSIS — R768 Other specified abnormal immunological findings in serum: Secondary | ICD-10-CM | POA: Diagnosis not present

## 2021-10-08 DIAGNOSIS — M1990 Unspecified osteoarthritis, unspecified site: Secondary | ICD-10-CM | POA: Diagnosis not present

## 2021-10-08 DIAGNOSIS — M6281 Muscle weakness (generalized): Secondary | ICD-10-CM | POA: Diagnosis not present

## 2021-10-08 NOTE — Patient Outreach (Signed)
[place] [facility]) Care Management  10/08/2021  Jemila Camille Lamartina [birth year] [phone]   First telephone outreach attempt to obtain mRS. No answer. Left message for returned call.  Philmore Pali [facility] Management Assistant [phone]

## 2021-10-09 DIAGNOSIS — M6281 Muscle weakness (generalized): Secondary | ICD-10-CM | POA: Diagnosis not present

## 2021-10-10 DIAGNOSIS — M6281 Muscle weakness (generalized): Secondary | ICD-10-CM | POA: Diagnosis not present

## 2021-10-11 ENCOUNTER — Other Ambulatory Visit: Payer: Self-pay

## 2021-10-11 DIAGNOSIS — M6281 Muscle weakness (generalized): Secondary | ICD-10-CM | POA: Diagnosis not present

## 2021-10-11 NOTE — Patient Outreach (Signed)
[facility]) Care Management  10/11/2021  Marillyn Goren Patnaude [birth year] [id number]   Telephone outreach to patient's daughter in law to obtain mRS was successfully completed. MRS= [place] Care Management Assistant

## 2021-10-11 NOTE — Patient Outreach (Signed)
[place] [facility]) Care Management  10/11/2021  [name] 11-28-1940 [id number]   Second telephone outreach attempt to obtain mRS. No answer. Left message for returned call.  Philmore Pali [facility] Management Assistant [phone]

## 2021-10-12 DIAGNOSIS — M6281 Muscle weakness (generalized): Secondary | ICD-10-CM | POA: Diagnosis not present

## 2021-10-15 ENCOUNTER — Ambulatory Visit: Payer: Medicare HMO | Admitting: Licensed Clinical Social Worker

## 2021-10-15 DIAGNOSIS — I10 Essential (primary) hypertension: Secondary | ICD-10-CM

## 2021-10-15 DIAGNOSIS — F0390 Unspecified dementia without behavioral disturbance: Secondary | ICD-10-CM

## 2021-10-15 DIAGNOSIS — M159 Polyosteoarthritis, unspecified: Secondary | ICD-10-CM

## 2021-10-15 DIAGNOSIS — R413 Other amnesia: Secondary | ICD-10-CM

## 2021-10-15 DIAGNOSIS — M6281 Muscle weakness (generalized): Secondary | ICD-10-CM | POA: Diagnosis not present

## 2021-10-15 DIAGNOSIS — I4821 Permanent atrial fibrillation: Secondary | ICD-10-CM

## 2021-10-16 DIAGNOSIS — M6281 Muscle weakness (generalized): Secondary | ICD-10-CM | POA: Diagnosis not present

## 2021-10-16 NOTE — Patient Instructions (Signed)
Visit Information  Thank you for taking time to visit with me today. Please don't hesitate to contact me if I can be of assistance to you before our next scheduled telephone appointment.  Following are the goals we discussed today:  (Copy and paste patient goals from clinical care plan here)  Our next appointment is by telephone on 01/14/22 at 3:00 PM  Please call the care guide team at [phone] if you need to cancel or reschedule your appointment.   If you are experiencing a Mental Health or [place] or need someone to talk to, please call 911   Patient verbalizes understanding of instructions provided today and agrees to view in [place].   Christa See, MSW, [email or web address] Phone [phone] 12:19 PM

## 2021-10-16 NOTE — Chronic Care Management (AMB) (Signed)
Chronic Care Management    Clinical Social Work Note  10/16/2021 Name: Laniece Hornbaker Spradley MRN: [id number] DOB: 13-Sep-1941  [name] A Peggs is a 80 y.o. year old female who is a primary care patient of [name], DO. The CCM team was consulted to assist the patient with chronic disease management and/or care coordination needs related to: Mental Health Counseling and Resources and Caregiver Stress.   Engaged with patient's daughter in law by telephone for follow up visit in response to provider referral for social work chronic care management and care coordination services.   Consent to Services:  The patient was given information about Chronic Care Management services, agreed to services, and gave verbal consent prior to initiation of services.  Please see initial visit note for detailed documentation.   Patient agreed to services and consent obtained.   Consent to Services:  The patient was given information about Care Management services, agreed to services, and gave verbal consent prior to initiation of services.  Please see initial visit note for detailed documentation.   Patient agreed to services today and consent obtained.  Engaged with patient's daughter in law by phone in response to provider referral for social work care coordination services:  Assessment/Interventions:  Patient continues to maintain positive progress with care plan goals. Patient's daughter in law states pt is "doing pretty darn good" She visited her yesterday at [facility]. Patient was observed to be receptive to instructions, eating well, has remained active despite feeling ill for the last few days. Family will follow up with facility about Occupational Therapy prior to PT. See Care Plan below for interventions and patient self-care activities.  Recent life changes or stressors: Management of health conditions  Recommendation: Patient may benefit from, and is in agreement work with [name] to address care  coordination needs and will continue to work with the clinical team to address health care and disease management related needs.  Follow up Plan: Patient would like continued follow-up from CCM [name].  per patient's request will follow up in 01/14/22.  Will call office if needed prior to next encounter.    SDOH (Social Determinants of Health) assessments and interventions performed:    Advanced Directives Status: Not addressed in this encounter.  CCM Care Plan  Allergies  Allergen Reactions   Amlodipine Other (See Comments)    Leg swelling   Haldol [Haloperidol Lactate] Other (See Comments)    Neuroleptic Malignant syndrome    Hctz [Hydrochlorothiazide] Nausea Only and Rash    Outpatient Encounter Medications as of 10/15/2021  Medication Sig   acetaminophen (TYLENOL) 325 MG tablet Take 2 tablets (650 mg total) by mouth every 4 (four) hours as needed for mild pain (or temp > 37.5 C (99.5 F)).   atorvastatin (LIPITOR) 40 MG tablet Take 1 tablet (40 mg total) by mouth at bedtime.   clonazepam (KLONOPIN) 0.125 MG disintegrating tablet Take 1 tablet (0.125 mg total) by mouth 2 (two) times daily.   divalproex (DEPAKOTE SPRINKLE) 125 MG capsule Take 2 capsules (250 mg total) by mouth every 12 (twelve) hours.   propranolol (INDERAL) 10 MG tablet Take 1 tablet (10 mg total) by mouth 3 (three) times daily.   QUEtiapine (SEROQUEL) 50 MG tablet Take 1 tablet (50 mg total) by mouth 3 (three) times daily.   rivaroxaban (XARELTO) 20 MG TABS tablet Take 1 tablet (20 mg total) by mouth daily with supper.   senna-docusate (SENOKOT-S) 8.6-50 MG tablet Take 1 tablet by mouth at bedtime as  needed for moderate constipation or mild constipation.   tamsulosin (FLOMAX) 0.4 MG CAPS capsule Take 1 capsule (0.4 mg total) by mouth daily after supper.   No facility-administered encounter medications on file as of 10/15/2021.    Patient Active Problem List   Diagnosis Date Noted   Ambulatory dysfunction     Agitation due to dementia 06/11/2021   Stroke ([place]) 06/09/2021   Acute ischemic left MCA stroke ([place]) 06/09/2021   Middle cerebral artery embolism, left 06/09/2021   Acute ischemic stroke [facility])    Memory loss 05/28/2021   Dementia without behavioral disturbance ([place]) 05/28/2021   Neuroleptic malignant syndrome 04/01/2021   History of CVA with residual deficit 03/30/2021   Acute confusion 03/29/2021   Delirium 03/29/2021   Cellulitis 03/29/2021   Chronic venous insufficiency 10/14/2016   Atrial fibrillation ([place]) 06/15/2015   Rash 06/15/2015   HTN (hypertension) 04/11/2015   Osteoarthritis 04/11/2015    Conditions to be addressed/monitored: Dementia  Care Plan : General Social Work (Adult)  Updates made by Rebekah Chesterfield, [name] since 10/16/2021 12:00 AM     Problem: Quality of Life (General Plan of Care)      Long-Range Goal: Quality of Life Maintained   Start Date: 04/26/2021  This Visit's Progress: On track  Recent Progress: On track  Priority: High  Note:   Current barriers:    Level of care concerns, ADL IADL limitations, Family and relationship dysfunction, Memory Deficits, Inability to perform ADL's independently, and Inability to perform IADL's independently Clinical Goals: Patient will work with CCM [name] to address needs related to strengthening support to assist with unmet needs Clinical Interventions:  Assessment of needs, barriers , agencies contacted, as well as how impacting health Patient's daughter in law, [name] provided all hx during call Patient had another stroke resulting in emergency surgery. She has been dismissed her from rehab at [facility] on the 07/21/21 due to patient's cognitive abilities not improving, in addition, to concerns that she doesn't follow commands. Family shared that they have not observed any improvement while patient has been a resident and they are not happy about services provided at facility 11/08: Patient is still admitted at  [facility]; however, insurance is not covering expenses due to reports that patient's progress is not improving. Family are not satisfied with the services provided in the facility and are thinking about discharging patient home with home care. [name] has obtained information on Palliative Care through [employer] 11/14: Patient currently has increased her mobility and strength in her core, legs, and arms. Patient's motivation increased to participate in physical therapy and has increased time spent outside the room. Her personality has re-surfaced 12/12: Patient's daughter in law states pt is "doing pretty darn good" She visited her yesterday at [facility]. Patient was observed to be receptive to instructions, eating well, has remained active despite feeling ill for the last few days.  Patient's daughter in law visits every Monday. [name] has a friend who is a nurse visit patient on Tuesdays and Thursdays to work with improving mobility. [name] plans to schedule a follow up appt with PCP to discuss next steps 11/14: Pt's DIL will follow up with the social worker at [facility] regarding referral to palliative care and re-assessment of physical therapy 12/12: Family will follow up with facility about Occupational Therapy prior to PT Family are currently hoping that patient is transferred to [facility] or [facility]. Once transferred, they will request patient to be re-assessed for PT.  Daughter in law shared that their goal is to have patient return home, when she becomes stronger. DIL will follow up with [place] today regarding requested paperwork and [facility] (won't hear anything until open bed) 11/8: Other facilities will not admit patient due to high level of care. Patient would need to be capable of transitioning legs to the side of the bed Family visits patient daily CCM [name] commended family on continuing to advocate for patient to ensure she receives appropriate services and care Patient was  discharged from [facility] this afternoon. She will remain with her son and daughter in law; however, she doesn't fully understand due to cognitive impairment. Per daughter in law, sometimes patient retains and understands information and other times not. There are times when patient becomes upset easily so family has been mindful of how to relay information to her Patient was unable to attend Neurologist appointment scheduled on [facility]08/21 with [facility] due to [place]. DIL shared that [facility]'s transportation service did not bring patient to appointment timely, resulting in cancellation. She has called approximately 4 different Neurologists; however, the soonest appointments were scheduled with [facility] for 07/30/21 and [facility] 07/10/21 Patient is on the wait-lists for both specialists, in case of any cancellations Family continues to re-direct patient often by changing subject. They try not confront patient's delusions Patient has been enjoying watching tv and spending time with family Family are working on obtaining a sitter to assist with supervision of patient while DIL works and/or runs errands. CCM [name] provided options through [employer], provided through email. Family will also reach out to family and friends for assistance and additional support CCM [name] discussed strategies to promote patient safety while in their home. Family plans on getting alarms on door to assist with monitoring patient to prevent wandering Patient's eldest son and [name] went to court on 04/24/21 to file for Guardianship for concerns regarding patient's care under the supervision of her daughter. Per [name], patient was not taking medications and her medical needs were not made a priority, which resulted in recent hospitalization. They were encouraged to obtain a bond regarding patient's assets. Once obtained, they will provide paperwork to court to finalize Guardianship. Patient's son will be th guardian of estate and he and  [name] will be co-guardians of patient's medical care. Per the agreement, patient's son will have to update his siblings weekly and provide them with patient's medical records CCM [name] reviewed upcoming appointments Collaboration with [name], DO regarding development and update of comprehensive plan of care as evidenced by provider attestation and co-signature Inter-disciplinary care team collaboration (see longitudinal plan of care) Discussed plans with patient for ongoing care management follow up and provided patient with direct contact information for care management team Clinical interventions provided:Solution-Focused Strategies, Active listening / Reflection utilized , Emotional Supportive Provided, Caregiver stress acknowledged , Verbalization of feelings encouraged , Discussed Guardianship and reviewed process , and [place] of Attorney  Patient Goals/Self-Care Activities: Over the next 120 days Attend all scheduled appointments with provider Contact clinic with any questions or concerns Utilize resources provided           Christa See, MSW, [email or web address] Phone [phone] 12:13 PM

## 2021-10-17 DIAGNOSIS — M6281 Muscle weakness (generalized): Secondary | ICD-10-CM | POA: Diagnosis not present

## 2021-10-18 DIAGNOSIS — M6281 Muscle weakness (generalized): Secondary | ICD-10-CM | POA: Diagnosis not present

## 2021-10-19 DIAGNOSIS — M6281 Muscle weakness (generalized): Secondary | ICD-10-CM | POA: Diagnosis not present

## 2021-10-31 DIAGNOSIS — I83214 Varicose veins of right lower extremity with both ulcer of heel and midfoot and inflammation: Secondary | ICD-10-CM | POA: Diagnosis not present

## 2021-11-16 DIAGNOSIS — I10 Essential (primary) hypertension: Secondary | ICD-10-CM | POA: Diagnosis not present

## 2021-11-16 DIAGNOSIS — E119 Type 2 diabetes mellitus without complications: Secondary | ICD-10-CM | POA: Diagnosis not present

## 2021-12-03 DIAGNOSIS — I83214 Varicose veins of right lower extremity with both ulcer of heel and midfoot and inflammation: Secondary | ICD-10-CM | POA: Diagnosis not present

## 2021-12-25 DIAGNOSIS — R279 Unspecified lack of coordination: Secondary | ICD-10-CM | POA: Diagnosis not present

## 2021-12-25 DIAGNOSIS — M6281 Muscle weakness (generalized): Secondary | ICD-10-CM | POA: Diagnosis not present

## 2021-12-26 DIAGNOSIS — R279 Unspecified lack of coordination: Secondary | ICD-10-CM | POA: Diagnosis not present

## 2021-12-26 DIAGNOSIS — M6281 Muscle weakness (generalized): Secondary | ICD-10-CM | POA: Diagnosis not present

## 2021-12-26 DIAGNOSIS — I83214 Varicose veins of right lower extremity with both ulcer of heel and midfoot and inflammation: Secondary | ICD-10-CM | POA: Diagnosis not present

## 2021-12-27 DIAGNOSIS — M6281 Muscle weakness (generalized): Secondary | ICD-10-CM | POA: Diagnosis not present

## 2021-12-27 DIAGNOSIS — R279 Unspecified lack of coordination: Secondary | ICD-10-CM | POA: Diagnosis not present

## 2022-01-02 DIAGNOSIS — L97319 Non-pressure chronic ulcer of right ankle with unspecified severity: Secondary | ICD-10-CM | POA: Diagnosis not present

## 2022-01-09 DIAGNOSIS — L97319 Non-pressure chronic ulcer of right ankle with unspecified severity: Secondary | ICD-10-CM | POA: Diagnosis not present

## 2022-01-14 ENCOUNTER — Ambulatory Visit (INDEPENDENT_AMBULATORY_CARE_PROVIDER_SITE_OTHER): Payer: Medicare HMO | Admitting: Licensed Clinical Social Worker

## 2022-01-14 DIAGNOSIS — I10 Essential (primary) hypertension: Secondary | ICD-10-CM

## 2022-01-14 DIAGNOSIS — F0390 Unspecified dementia without behavioral disturbance: Secondary | ICD-10-CM

## 2022-01-14 NOTE — Patient Instructions (Signed)
Visit Information ? ?Thank you for taking time to visit with me today. Please don't hesitate to contact me if I can be of assistance to you before our next scheduled telephone appointment. ? ?Following are the goals we discussed today:  ?Patient Goals/Self-Care Activities: Over the next 120 days ?Attend all scheduled appointments with provider ?Contact clinic with any questions or concerns ?Utilize resources provided ? ?Our next appointment is by telephone on 03/27/22 at 3:00 PM ? ?Please call the care guide team at [phone] if you need to cancel or reschedule your appointment.  ? ?If you are experiencing a Mental Health or [place] or need someone to talk to, please call 911  ? ?Patient verbalizes understanding of instructions and care plan provided today and agrees to view in [place]. Active MyChart status confirmed with patient.   ? ?Christa See, MSW, [name] ?[facility] ?[facility] ?[email or web address] ?Phone [phone] ?5:57 PM ? ?

## 2022-01-14 NOTE — Chronic Care Management (AMB) (Cosign Needed)
Chronic Care Management    Clinical Social Work Note  01/14/2022 Name: [name] MRN: [id number] DOB: 03-05-1941  [name] is a 81 y.o. year old female who is a primary care patient of Lisa Roys, DO. The CCM team was consulted to assist the patient with chronic disease management and/or care coordination needs related to: Level of Care Concerns.   Engaged with patient's DIL by telephone for follow up visit in response to provider referral for social work chronic care management and care coordination services.   Consent to Services:  The patient was given information about Chronic Care Management services, agreed to services, and gave verbal consent prior to initiation of services.  Please see initial visit note for detailed documentation.   Patient agreed to services and consent obtained.   Assessment: Review of patient past medical history, allergies, medications, and health status, including review of relevant consultants reports was performed today as part of a comprehensive evaluation and provision of chronic care management and care coordination services.     SDOH (Social Determinants of Health) assessments and interventions performed:    Advanced Directives Status: Not addressed in this encounter.  CCM Care Plan  Allergies  Allergen Reactions   Amlodipine Other (See Comments)    Leg swelling   Haldol [Haloperidol Lactate] Other (See Comments)    Neuroleptic Malignant syndrome    Hctz [Hydrochlorothiazide] Nausea Only and Rash    Outpatient Encounter Medications as of 01/14/2022  Medication Sig   acetaminophen (TYLENOL) 325 MG tablet Take 2 tablets (650 mg total) by mouth every 4 (four) hours as needed for mild pain (or temp > 37.5 C (99.5 F)).   atorvastatin (LIPITOR) 40 MG tablet Take 1 tablet (40 mg total) by mouth at bedtime.   clonazepam (KLONOPIN) 0.125 MG disintegrating tablet Take 1 tablet (0.125 mg total) by mouth 2 (two) times daily.   divalproex  (DEPAKOTE SPRINKLE) 125 MG capsule Take 2 capsules (250 mg total) by mouth every 12 (twelve) hours.   propranolol (INDERAL) 10 MG tablet Take 1 tablet (10 mg total) by mouth 3 (three) times daily.   QUEtiapine (SEROQUEL) 50 MG tablet Take 1 tablet (50 mg total) by mouth 3 (three) times daily.   rivaroxaban (XARELTO) 20 MG TABS tablet Take 1 tablet (20 mg total) by mouth daily with supper.   senna-docusate (SENOKOT-S) 8.6-50 MG tablet Take 1 tablet by mouth at bedtime as needed for moderate constipation or mild constipation.   tamsulosin (FLOMAX) 0.4 MG CAPS capsule Take 1 capsule (0.4 mg total) by mouth daily after supper.   No facility-administered encounter medications on file as of 01/14/2022.    Patient Active Problem List   Diagnosis Date Noted   Ambulatory dysfunction    Agitation due to dementia 06/11/2021   Stroke ([place]) 06/09/2021   Acute ischemic left MCA stroke ([place]) 06/09/2021   Middle cerebral artery embolism, left 06/09/2021   Acute ischemic stroke [facility])    Memory loss 05/28/2021   Dementia without behavioral disturbance ([place]) 05/28/2021   Neuroleptic malignant syndrome 04/01/2021   History of CVA with residual deficit 03/30/2021   Acute confusion 03/29/2021   Delirium 03/29/2021   Cellulitis 03/29/2021   Chronic venous insufficiency 10/14/2016   Atrial fibrillation ([place]) 06/15/2015   Rash 06/15/2015   HTN (hypertension) 04/11/2015   Osteoarthritis 04/11/2015    Conditions to be addressed/monitored: Dementia  Care Plan : General Social Work (Adult)  Updates made by [name], [name] since 01/14/2022 12:00  AM     Problem: Quality of Life (General Plan of Care)      Long-Range Goal: Quality of Life Maintained   Start Date: 04/26/2021  This Visit's Progress: On track  Recent Progress: On track  Priority: High  Note:   Current barriers:    Level of care concerns, ADL IADL limitations, Family and relationship dysfunction, Memory Deficits, Inability to perform  ADL's independently, and Inability to perform IADL's independently Clinical Goals: Patient will work with CCM [name] to address needs related to strengthening support to assist with unmet needs Clinical Interventions:  Assessment of needs, barriers , agencies contacted, as well as how impacting health Patient's daughter in law, [name] provided all hx during call Patient had another stroke resulting in emergency surgery. She has been dismissed her from rehab at [facility] on the 07/21/21 due to patient's cognitive abilities not improving, in addition, to concerns that she doesn't follow commands. Family shared that they have not observed any improvement while patient has been a resident and they are not happy about services provided at facility 11/08: Patient is still admitted at [facility]; however, insurance is not covering expenses due to reports that patient's progress is not improving. Family are not satisfied with the services provided in the facility and are thinking about discharging patient home with home care. [name] has obtained information on Palliative Care through [employer] 11/14: Patient currently has increased her mobility and strength in her core, legs, and arms. Patient's motivation increased to participate in physical therapy and has increased time spent outside the room. Her personality has re-surfaced 12/12: Patient's daughter in law states pt is "doing pretty darn good" She visited her yesterday at [facility]. Patient was observed to be receptive to instructions, eating well, has remained active despite feeling ill for the last few days 3/13: Patient's family is scheduling a Plan of Care meeting with facility to advocate for additional PT. Family would love for pt to transfer to assisted living, if able Patient's daughter in law visits every Monday. [name] has a friend who is a nurse visit patient on Tuesdays and Thursdays to work with improving mobility. [name] plans to  schedule a follow up appt with PCP to discuss next steps 11/14: Pt's DIL will follow up with the social worker at [facility] regarding referral to palliative care and re-assessment of physical therapy 12/12: Family will follow up with facility about Occupational Therapy prior to PT Family are currently hoping that patient is transferred to [facility] or [facility]. Once transferred, they will request patient to be re-assessed for PT. Daughter in law shared that their goal is to have patient return home, when she becomes stronger. DIL will follow up with [facility] today regarding requested paperwork and [facility] (won't hear anything until open bed) 11/8: Other facilities will not admit patient due to high level of care. Patient would need to be capable of transitioning legs to the side of the bed Family visits patient daily CCM [name] commended family on continuing to advocate for patient to ensure she receives appropriate services and care Patient was discharged from [facility] this afternoon. She will remain with her son and daughter in law; however, she doesn't fully understand due to cognitive impairment. Per daughter in law, sometimes patient retains and understands information and other times not. There are times when patient becomes upset easily so family has been mindful of how to relay information to her Patient was unable to attend Neurologist appointment scheduled on  05/08/21 with [facility] due to [place]. DIL shared that [facility]'s transportation service did not bring patient to appointment timely, resulting in cancellation. She has called approximately 4 different Neurologists; however, the soonest appointments were scheduled with [facility] for 07/30/21 and [facility] 07/10/21 Patient is on the wait-lists for both specialists, in case of any cancellations Family continues to re-direct patient often by changing subject. They try not confront patient's delusions Patient has been enjoying watching tv and spending time with  family 3/13: Patient is observed to be more active in the facility, utilizing a wheelchair Family are working on obtaining a sitter to assist with supervision of patient while DIL works and/or runs errands. CCM [name] provided options through [employer], provided through email. Family will also reach out to family and friends for assistance and additional support CCM [name] discussed strategies to promote patient safety while in their home. Family plans on getting alarms on door to assist with monitoring patient to prevent wandering Patient's eldest son and [name] went to court on 04/24/21 to file for Guardianship for concerns regarding patient's care under the supervision of her daughter. Per [name], patient was not taking medications and her medical needs were not made a priority, which resulted in recent hospitalization. They were encouraged to obtain a bond regarding patient's assets. Once obtained, they will provide paperwork to court to finalize Guardianship. Patient's son will be th guardian of estate and he and [name] will be co-guardians of patient's medical care. Per the agreement, patient's son will have to update his siblings weekly and provide them with patient's medical records CCM [name] reviewed upcoming appointments Collaboration with Lisa Roys, DO regarding development and update of comprehensive plan of care as evidenced by provider attestation and co-signature Inter-disciplinary care team collaboration (see longitudinal plan of care) Discussed plans with patient for ongoing care management follow up and provided patient with direct contact information for care management team Clinical interventions provided:Solution-Focused Strategies, Active listening / Reflection utilized , Emotional Supportive Provided, Caregiver stress acknowledged , Verbalization of feelings encouraged , Discussed Guardianship and reviewed process , and [place] of Attorney   Patient Goals/Self-Care Activities: Over the next 120 days Attend all scheduled appointments with provider Contact clinic with any questions or concerns Utilize resources provided         Christa See, MSW, [email or web address] Phone [phone] 5:56 PM

## 2022-01-23 DIAGNOSIS — L97319 Non-pressure chronic ulcer of right ankle with unspecified severity: Secondary | ICD-10-CM | POA: Diagnosis not present

## 2022-01-30 DIAGNOSIS — R339 Retention of urine, unspecified: Secondary | ICD-10-CM | POA: Diagnosis not present

## 2022-01-30 DIAGNOSIS — L97319 Non-pressure chronic ulcer of right ankle with unspecified severity: Secondary | ICD-10-CM | POA: Diagnosis not present

## 2022-01-30 DIAGNOSIS — M25562 Pain in left knee: Secondary | ICD-10-CM | POA: Diagnosis not present

## 2022-01-30 DIAGNOSIS — I639 Cerebral infarction, unspecified: Secondary | ICD-10-CM | POA: Diagnosis not present

## 2022-01-30 DIAGNOSIS — I4891 Unspecified atrial fibrillation: Secondary | ICD-10-CM | POA: Diagnosis not present

## 2022-01-30 DIAGNOSIS — E785 Hyperlipidemia, unspecified: Secondary | ICD-10-CM | POA: Diagnosis not present

## 2022-02-01 DIAGNOSIS — F0390 Unspecified dementia without behavioral disturbance: Secondary | ICD-10-CM

## 2022-02-06 DIAGNOSIS — L89893 Pressure ulcer of other site, stage 3: Secondary | ICD-10-CM | POA: Diagnosis not present

## 2022-02-14 DIAGNOSIS — I639 Cerebral infarction, unspecified: Secondary | ICD-10-CM | POA: Diagnosis not present

## 2022-02-14 DIAGNOSIS — R339 Retention of urine, unspecified: Secondary | ICD-10-CM | POA: Diagnosis not present

## 2022-02-14 DIAGNOSIS — R69 Illness, unspecified: Secondary | ICD-10-CM | POA: Diagnosis not present

## 2022-02-14 DIAGNOSIS — I4891 Unspecified atrial fibrillation: Secondary | ICD-10-CM | POA: Diagnosis not present

## 2022-02-14 DIAGNOSIS — M25562 Pain in left knee: Secondary | ICD-10-CM | POA: Diagnosis not present

## 2022-02-14 DIAGNOSIS — E785 Hyperlipidemia, unspecified: Secondary | ICD-10-CM | POA: Diagnosis not present

## 2022-02-20 DIAGNOSIS — L97519 Non-pressure chronic ulcer of other part of right foot with unspecified severity: Secondary | ICD-10-CM | POA: Diagnosis not present

## 2022-02-27 DIAGNOSIS — L97319 Non-pressure chronic ulcer of right ankle with unspecified severity: Secondary | ICD-10-CM | POA: Diagnosis not present

## 2022-03-06 DIAGNOSIS — L89893 Pressure ulcer of other site, stage 3: Secondary | ICD-10-CM | POA: Diagnosis not present

## 2022-03-13 DIAGNOSIS — L97519 Non-pressure chronic ulcer of other part of right foot with unspecified severity: Secondary | ICD-10-CM | POA: Diagnosis not present

## 2022-03-20 DIAGNOSIS — L97519 Non-pressure chronic ulcer of other part of right foot with unspecified severity: Secondary | ICD-10-CM | POA: Diagnosis not present

## 2022-03-27 ENCOUNTER — Ambulatory Visit: Payer: Medicare HMO | Admitting: Licensed Clinical Social Worker

## 2022-03-27 DIAGNOSIS — I10 Essential (primary) hypertension: Secondary | ICD-10-CM

## 2022-03-27 DIAGNOSIS — I4821 Permanent atrial fibrillation: Secondary | ICD-10-CM

## 2022-03-27 DIAGNOSIS — L97519 Non-pressure chronic ulcer of other part of right foot with unspecified severity: Secondary | ICD-10-CM | POA: Diagnosis not present

## 2022-03-27 DIAGNOSIS — F0390 Unspecified dementia without behavioral disturbance: Secondary | ICD-10-CM

## 2022-03-28 ENCOUNTER — Ambulatory Visit (INDEPENDENT_AMBULATORY_CARE_PROVIDER_SITE_OTHER): Payer: Medicare HMO | Admitting: Licensed Clinical Social Worker

## 2022-03-28 DIAGNOSIS — M159 Polyosteoarthritis, unspecified: Secondary | ICD-10-CM

## 2022-03-28 DIAGNOSIS — F0390 Unspecified dementia without behavioral disturbance: Secondary | ICD-10-CM

## 2022-03-28 NOTE — Chronic Care Management (AMB) (Signed)
    Clinical Social Work  Care Management   Phone Outreach    03/28/2022 Name: [name] MRN: [id number] DOB: [birth year]  [name] is a 81 y.o. year old female who is a primary care patient of [name], DO .   Reason for referral: Level of Care Concerns.    F/U phone call today to assess needs, progress and barriers with care plan goals.   Pt's Daughter in Lanny Cramp was unable to keep phone appointment today and requested a call back 03/28/22 at 9:00 AM  Plan:Appointment was rescheduled with CCM [name]  Review of patient status, including review of consultants reports, relevant laboratory and other test results, and collaboration with appropriate care team members and the patient's provider was performed as part of comprehensive patient evaluation and provision of care management services.    Christa See, MSW, [email or web address] Phone [phone] 8:45 AM

## 2022-04-01 DIAGNOSIS — M6281 Muscle weakness (generalized): Secondary | ICD-10-CM | POA: Diagnosis not present

## 2022-04-01 DIAGNOSIS — R279 Unspecified lack of coordination: Secondary | ICD-10-CM | POA: Diagnosis not present

## 2022-04-01 DIAGNOSIS — R2681 Unsteadiness on feet: Secondary | ICD-10-CM | POA: Diagnosis not present

## 2022-04-02 DIAGNOSIS — R2681 Unsteadiness on feet: Secondary | ICD-10-CM | POA: Diagnosis not present

## 2022-04-02 DIAGNOSIS — M6281 Muscle weakness (generalized): Secondary | ICD-10-CM | POA: Diagnosis not present

## 2022-04-02 DIAGNOSIS — R279 Unspecified lack of coordination: Secondary | ICD-10-CM | POA: Diagnosis not present

## 2022-04-03 DIAGNOSIS — R279 Unspecified lack of coordination: Secondary | ICD-10-CM | POA: Diagnosis not present

## 2022-04-03 DIAGNOSIS — M6281 Muscle weakness (generalized): Secondary | ICD-10-CM | POA: Diagnosis not present

## 2022-04-03 DIAGNOSIS — R2681 Unsteadiness on feet: Secondary | ICD-10-CM | POA: Diagnosis not present

## 2022-04-03 DIAGNOSIS — F02A0 Dementia in other diseases classified elsewhere, mild, without behavioral disturbance, psychotic disturbance, mood disturbance, and anxiety: Secondary | ICD-10-CM

## 2022-04-04 DIAGNOSIS — R279 Unspecified lack of coordination: Secondary | ICD-10-CM | POA: Diagnosis not present

## 2022-04-04 DIAGNOSIS — M6281 Muscle weakness (generalized): Secondary | ICD-10-CM | POA: Diagnosis not present

## 2022-04-04 DIAGNOSIS — R2681 Unsteadiness on feet: Secondary | ICD-10-CM | POA: Diagnosis not present

## 2022-04-05 DIAGNOSIS — R2681 Unsteadiness on feet: Secondary | ICD-10-CM | POA: Diagnosis not present

## 2022-04-05 DIAGNOSIS — R279 Unspecified lack of coordination: Secondary | ICD-10-CM | POA: Diagnosis not present

## 2022-04-05 DIAGNOSIS — M6281 Muscle weakness (generalized): Secondary | ICD-10-CM | POA: Diagnosis not present

## 2022-04-08 DIAGNOSIS — R279 Unspecified lack of coordination: Secondary | ICD-10-CM | POA: Diagnosis not present

## 2022-04-08 DIAGNOSIS — M6281 Muscle weakness (generalized): Secondary | ICD-10-CM | POA: Diagnosis not present

## 2022-04-08 DIAGNOSIS — R2681 Unsteadiness on feet: Secondary | ICD-10-CM | POA: Diagnosis not present

## 2022-04-08 DIAGNOSIS — E785 Hyperlipidemia, unspecified: Secondary | ICD-10-CM | POA: Diagnosis not present

## 2022-04-08 DIAGNOSIS — I639 Cerebral infarction, unspecified: Secondary | ICD-10-CM | POA: Diagnosis not present

## 2022-04-08 DIAGNOSIS — I4891 Unspecified atrial fibrillation: Secondary | ICD-10-CM | POA: Diagnosis not present

## 2022-04-08 DIAGNOSIS — M1990 Unspecified osteoarthritis, unspecified site: Secondary | ICD-10-CM | POA: Diagnosis not present

## 2022-04-08 DIAGNOSIS — R69 Illness, unspecified: Secondary | ICD-10-CM | POA: Diagnosis not present

## 2022-04-09 DIAGNOSIS — R2681 Unsteadiness on feet: Secondary | ICD-10-CM | POA: Diagnosis not present

## 2022-04-09 DIAGNOSIS — M6281 Muscle weakness (generalized): Secondary | ICD-10-CM | POA: Diagnosis not present

## 2022-04-09 DIAGNOSIS — R279 Unspecified lack of coordination: Secondary | ICD-10-CM | POA: Diagnosis not present

## 2022-04-10 DIAGNOSIS — R279 Unspecified lack of coordination: Secondary | ICD-10-CM | POA: Diagnosis not present

## 2022-04-10 DIAGNOSIS — R2681 Unsteadiness on feet: Secondary | ICD-10-CM | POA: Diagnosis not present

## 2022-04-10 DIAGNOSIS — M6281 Muscle weakness (generalized): Secondary | ICD-10-CM | POA: Diagnosis not present

## 2022-04-11 DIAGNOSIS — I639 Cerebral infarction, unspecified: Secondary | ICD-10-CM | POA: Diagnosis not present

## 2022-04-11 DIAGNOSIS — Z79899 Other long term (current) drug therapy: Secondary | ICD-10-CM | POA: Diagnosis not present

## 2022-04-11 DIAGNOSIS — F419 Anxiety disorder, unspecified: Secondary | ICD-10-CM | POA: Diagnosis not present

## 2022-04-11 DIAGNOSIS — M1990 Unspecified osteoarthritis, unspecified site: Secondary | ICD-10-CM | POA: Diagnosis not present

## 2022-04-11 DIAGNOSIS — R69 Illness, unspecified: Secondary | ICD-10-CM | POA: Diagnosis not present

## 2022-04-11 DIAGNOSIS — E785 Hyperlipidemia, unspecified: Secondary | ICD-10-CM | POA: Diagnosis not present

## 2022-04-11 DIAGNOSIS — I4891 Unspecified atrial fibrillation: Secondary | ICD-10-CM | POA: Diagnosis not present

## 2022-04-12 DIAGNOSIS — R279 Unspecified lack of coordination: Secondary | ICD-10-CM | POA: Diagnosis not present

## 2022-04-12 DIAGNOSIS — E785 Hyperlipidemia, unspecified: Secondary | ICD-10-CM | POA: Diagnosis not present

## 2022-04-12 DIAGNOSIS — M6281 Muscle weakness (generalized): Secondary | ICD-10-CM | POA: Diagnosis not present

## 2022-04-12 DIAGNOSIS — R2681 Unsteadiness on feet: Secondary | ICD-10-CM | POA: Diagnosis not present

## 2022-04-12 DIAGNOSIS — F0150 Vascular dementia without behavioral disturbance: Secondary | ICD-10-CM | POA: Diagnosis not present

## 2022-04-12 DIAGNOSIS — R69 Illness, unspecified: Secondary | ICD-10-CM | POA: Diagnosis not present

## 2022-04-14 DIAGNOSIS — R279 Unspecified lack of coordination: Secondary | ICD-10-CM | POA: Diagnosis not present

## 2022-04-14 DIAGNOSIS — M6281 Muscle weakness (generalized): Secondary | ICD-10-CM | POA: Diagnosis not present

## 2022-04-14 DIAGNOSIS — R2681 Unsteadiness on feet: Secondary | ICD-10-CM | POA: Diagnosis not present

## 2022-04-15 DIAGNOSIS — M6281 Muscle weakness (generalized): Secondary | ICD-10-CM | POA: Diagnosis not present

## 2022-04-15 DIAGNOSIS — R2681 Unsteadiness on feet: Secondary | ICD-10-CM | POA: Diagnosis not present

## 2022-04-15 DIAGNOSIS — R279 Unspecified lack of coordination: Secondary | ICD-10-CM | POA: Diagnosis not present

## 2022-04-16 DIAGNOSIS — E785 Hyperlipidemia, unspecified: Secondary | ICD-10-CM | POA: Diagnosis not present

## 2022-04-16 DIAGNOSIS — I639 Cerebral infarction, unspecified: Secondary | ICD-10-CM | POA: Diagnosis not present

## 2022-04-16 DIAGNOSIS — I4891 Unspecified atrial fibrillation: Secondary | ICD-10-CM | POA: Diagnosis not present

## 2022-04-16 DIAGNOSIS — R2681 Unsteadiness on feet: Secondary | ICD-10-CM | POA: Diagnosis not present

## 2022-04-16 DIAGNOSIS — M6281 Muscle weakness (generalized): Secondary | ICD-10-CM | POA: Diagnosis not present

## 2022-04-16 DIAGNOSIS — Z79899 Other long term (current) drug therapy: Secondary | ICD-10-CM | POA: Diagnosis not present

## 2022-04-16 DIAGNOSIS — R339 Retention of urine, unspecified: Secondary | ICD-10-CM | POA: Diagnosis not present

## 2022-04-16 DIAGNOSIS — M1990 Unspecified osteoarthritis, unspecified site: Secondary | ICD-10-CM | POA: Diagnosis not present

## 2022-04-16 DIAGNOSIS — R279 Unspecified lack of coordination: Secondary | ICD-10-CM | POA: Diagnosis not present

## 2022-04-16 DIAGNOSIS — R69 Illness, unspecified: Secondary | ICD-10-CM | POA: Diagnosis not present

## 2022-04-17 DIAGNOSIS — R2681 Unsteadiness on feet: Secondary | ICD-10-CM | POA: Diagnosis not present

## 2022-04-17 DIAGNOSIS — R279 Unspecified lack of coordination: Secondary | ICD-10-CM | POA: Diagnosis not present

## 2022-04-17 DIAGNOSIS — M6281 Muscle weakness (generalized): Secondary | ICD-10-CM | POA: Diagnosis not present

## 2022-04-18 DIAGNOSIS — R2681 Unsteadiness on feet: Secondary | ICD-10-CM | POA: Diagnosis not present

## 2022-04-18 DIAGNOSIS — R279 Unspecified lack of coordination: Secondary | ICD-10-CM | POA: Diagnosis not present

## 2022-04-18 DIAGNOSIS — M6281 Muscle weakness (generalized): Secondary | ICD-10-CM | POA: Diagnosis not present

## 2022-04-18 NOTE — Chronic Care Management (AMB) (Signed)
Chronic Care Management    Clinical Social Work Note  04/18/2022 Name: [name] MRN: [id number] DOB: 05/28/1941  [name] is a 81 y.o. year old female who is a primary care patient of [name], DO. The CCM team was consulted to assist the patient with chronic disease management and/or care coordination needs related to: Caregiver Stress.   Engaged with patient's daughter in law by telephone for follow up visit in response to provider referral for social work chronic care management and care coordination services.   Consent to Services:  The patient was given information about Chronic Care Management services, agreed to services, and gave verbal consent prior to initiation of services.  Please see initial visit note for detailed documentation.   Patient agreed to services and consent obtained.   Assessment: Review of patient past medical history, allergies, medications, and health status, including review of relevant consultants reports was performed today as part of a comprehensive evaluation and provision of chronic care management and care coordination services.     SDOH (Social Determinants of Health) assessments and interventions performed:    Advanced Directives Status: Not addressed in this encounter.  CCM Care Plan  Allergies  Allergen Reactions   Amlodipine Other (See Comments)    Leg swelling   Haldol [Haloperidol Lactate] Other (See Comments)    Neuroleptic Malignant syndrome    Hctz [Hydrochlorothiazide] Nausea Only and Rash    Outpatient Encounter Medications as of 03/28/2022  Medication Sig   acetaminophen (TYLENOL) 325 MG tablet Take 2 tablets (650 mg total) by mouth every 4 (four) hours as needed for mild pain (or temp > 37.5 C (99.5 F)).   atorvastatin (LIPITOR) 40 MG tablet Take 1 tablet (40 mg total) by mouth at bedtime.   clonazepam (KLONOPIN) 0.125 MG disintegrating tablet Take 1 tablet (0.125 mg total) by mouth 2 (two) times daily.   divalproex  (DEPAKOTE SPRINKLE) 125 MG capsule Take 2 capsules (250 mg total) by mouth every 12 (twelve) hours.   propranolol (INDERAL) 10 MG tablet Take 1 tablet (10 mg total) by mouth 3 (three) times daily.   QUEtiapine (SEROQUEL) 50 MG tablet Take 1 tablet (50 mg total) by mouth 3 (three) times daily.   rivaroxaban (XARELTO) 20 MG TABS tablet Take 1 tablet (20 mg total) by mouth daily with supper.   senna-docusate (SENOKOT-S) 8.6-50 MG tablet Take 1 tablet by mouth at bedtime as needed for moderate constipation or mild constipation.   tamsulosin (FLOMAX) 0.4 MG CAPS capsule Take 1 capsule (0.4 mg total) by mouth daily after supper.   No facility-administered encounter medications on file as of 03/28/2022.    Patient Active Problem List   Diagnosis Date Noted   Ambulatory dysfunction    Agitation due to dementia ([place]) 06/11/2021   Stroke ([place]) 06/09/2021   Acute ischemic left MCA stroke ([place]) 06/09/2021   Middle cerebral artery embolism, left 06/09/2021   Acute ischemic stroke [facility])    Memory loss 05/28/2021   Dementia without behavioral disturbance ([place]) 05/28/2021   Neuroleptic malignant syndrome 04/01/2021   History of CVA with residual deficit 03/30/2021   Acute confusion 03/29/2021   Delirium 03/29/2021   Cellulitis 03/29/2021   Chronic venous insufficiency 10/14/2016   Atrial fibrillation ([place]) 06/15/2015   Rash 06/15/2015   HTN (hypertension) 04/11/2015   Osteoarthritis 04/11/2015    Conditions to be addressed/monitored: Dementia; Caregiver Stress  Care Plan : General Social Work (Adult)  Updates made by Rebekah [place], [name]  since 04/18/2022 12:00 AM     Problem: Quality of Life (General Plan of Care)      Long-Range Goal: Quality of Life Maintained Completed 03/28/2022  Start Date: 04/26/2021  This Visit's Progress: On track  Recent Progress: On track  Priority: High  Note:   Current barriers:    Level of care concerns, ADL IADL limitations, Family and relationship  dysfunction, Memory Deficits, Inability to perform ADL's independently, and Inability to perform IADL's independently Clinical Goals: Patient will work with CCM [name] to address needs related to strengthening support to assist with unmet needs Clinical Interventions:  Assessment of needs, barriers , agencies contacted, as well as how impacting health. Patient's daughter in law, [name] provided all hx during call Patient continues to remain in facility and family are advocating for additional services to strengthen pt's legs. Their goal is to have her successfully transfer. [name] agreed to request a team meeting with facility staff to establish new goals for patient CCM [name] informed family of when it would be appropriate to report safety concerns with local Ombudsman  CCM [name] commended family on continuing to advocate for patient to ensure she receives appropriate services and care CCM [name] reviewed upcoming appointments Collaboration with [name], DO regarding development and update of comprehensive plan of care as evidenced by provider attestation and co-signature Inter-disciplinary care team collaboration (see longitudinal plan of care) Discussed plans with patient for ongoing care management follow up and provided patient with direct contact information for care management team Clinical interventions provided:Solution-Focused Strategies, Active listening / Reflection utilized , Emotional Supportive Provided, Caregiver stress acknowledged , Verbalization of feelings encouraged  Patient Goals/Self-Care Activities: Over the next 120 days Attend all scheduled appointments with provider Contact clinic with any questions or concerns Utilize resources provided          Follow Up Plan: No follow up required. Pt has completed all care plan goals associated with CCM [name]      Christa See, MSW, [email or web address] Phone [phone] 9:35 AM

## 2022-04-18 NOTE — Patient Instructions (Signed)
Visit Information  Thank you for taking time to visit with me today. Please don't hesitate to contact me if I can be of assistance to you before our next scheduled telephone appointment.  Following are the goals we discussed today:  Patient Goals/Self-Care Activities: Over the next 120 days Attend all scheduled appointments with provider Contact clinic with any questions or concerns Utilize resources provided   No follow up required. Pt has completed all goals associated with CCM [name]  If you are experiencing a Mental Health or [place] or need someone to talk to, please call 911   Patient verbalizes understanding of instructions and care plan provided today and agrees to view in [place]. Active MyChart status and patient understanding of how to access instructions and care plan via MyChart confirmed with patient.     Christa See, MSW, [email or web address] Phone [phone] 9:36 AM

## 2022-04-19 DIAGNOSIS — R279 Unspecified lack of coordination: Secondary | ICD-10-CM | POA: Diagnosis not present

## 2022-04-19 DIAGNOSIS — M6281 Muscle weakness (generalized): Secondary | ICD-10-CM | POA: Diagnosis not present

## 2022-04-19 DIAGNOSIS — R2681 Unsteadiness on feet: Secondary | ICD-10-CM | POA: Diagnosis not present

## 2022-04-22 DIAGNOSIS — M6281 Muscle weakness (generalized): Secondary | ICD-10-CM | POA: Diagnosis not present

## 2022-04-22 DIAGNOSIS — R279 Unspecified lack of coordination: Secondary | ICD-10-CM | POA: Diagnosis not present

## 2022-04-22 DIAGNOSIS — R2681 Unsteadiness on feet: Secondary | ICD-10-CM | POA: Diagnosis not present

## 2022-04-23 DIAGNOSIS — M6281 Muscle weakness (generalized): Secondary | ICD-10-CM | POA: Diagnosis not present

## 2022-04-23 DIAGNOSIS — R2681 Unsteadiness on feet: Secondary | ICD-10-CM | POA: Diagnosis not present

## 2022-04-23 DIAGNOSIS — R279 Unspecified lack of coordination: Secondary | ICD-10-CM | POA: Diagnosis not present

## 2022-04-24 DIAGNOSIS — R2681 Unsteadiness on feet: Secondary | ICD-10-CM | POA: Diagnosis not present

## 2022-04-24 DIAGNOSIS — M6281 Muscle weakness (generalized): Secondary | ICD-10-CM | POA: Diagnosis not present

## 2022-04-24 DIAGNOSIS — R279 Unspecified lack of coordination: Secondary | ICD-10-CM | POA: Diagnosis not present

## 2022-04-25 DIAGNOSIS — R279 Unspecified lack of coordination: Secondary | ICD-10-CM | POA: Diagnosis not present

## 2022-04-25 DIAGNOSIS — M6281 Muscle weakness (generalized): Secondary | ICD-10-CM | POA: Diagnosis not present

## 2022-04-25 DIAGNOSIS — R2681 Unsteadiness on feet: Secondary | ICD-10-CM | POA: Diagnosis not present

## 2022-04-26 DIAGNOSIS — R2681 Unsteadiness on feet: Secondary | ICD-10-CM | POA: Diagnosis not present

## 2022-04-26 DIAGNOSIS — R279 Unspecified lack of coordination: Secondary | ICD-10-CM | POA: Diagnosis not present

## 2022-04-26 DIAGNOSIS — M6281 Muscle weakness (generalized): Secondary | ICD-10-CM | POA: Diagnosis not present

## 2022-04-29 DIAGNOSIS — R2681 Unsteadiness on feet: Secondary | ICD-10-CM | POA: Diagnosis not present

## 2022-04-29 DIAGNOSIS — M6281 Muscle weakness (generalized): Secondary | ICD-10-CM | POA: Diagnosis not present

## 2022-04-29 DIAGNOSIS — R279 Unspecified lack of coordination: Secondary | ICD-10-CM | POA: Diagnosis not present

## 2022-04-30 DIAGNOSIS — M6281 Muscle weakness (generalized): Secondary | ICD-10-CM | POA: Diagnosis not present

## 2022-04-30 DIAGNOSIS — R2681 Unsteadiness on feet: Secondary | ICD-10-CM | POA: Diagnosis not present

## 2022-04-30 DIAGNOSIS — R279 Unspecified lack of coordination: Secondary | ICD-10-CM | POA: Diagnosis not present

## 2022-05-01 DIAGNOSIS — R2681 Unsteadiness on feet: Secondary | ICD-10-CM | POA: Diagnosis not present

## 2022-05-01 DIAGNOSIS — M6281 Muscle weakness (generalized): Secondary | ICD-10-CM | POA: Diagnosis not present

## 2022-05-01 DIAGNOSIS — R279 Unspecified lack of coordination: Secondary | ICD-10-CM | POA: Diagnosis not present

## 2022-05-02 DIAGNOSIS — M6281 Muscle weakness (generalized): Secondary | ICD-10-CM | POA: Diagnosis not present

## 2022-05-02 DIAGNOSIS — R279 Unspecified lack of coordination: Secondary | ICD-10-CM | POA: Diagnosis not present

## 2022-05-02 DIAGNOSIS — R2681 Unsteadiness on feet: Secondary | ICD-10-CM | POA: Diagnosis not present

## 2022-05-03 DIAGNOSIS — M6281 Muscle weakness (generalized): Secondary | ICD-10-CM | POA: Diagnosis not present

## 2022-05-03 DIAGNOSIS — R2681 Unsteadiness on feet: Secondary | ICD-10-CM | POA: Diagnosis not present

## 2022-05-03 DIAGNOSIS — R279 Unspecified lack of coordination: Secondary | ICD-10-CM | POA: Diagnosis not present

## 2022-05-06 DIAGNOSIS — M6281 Muscle weakness (generalized): Secondary | ICD-10-CM | POA: Diagnosis not present

## 2022-05-06 DIAGNOSIS — R279 Unspecified lack of coordination: Secondary | ICD-10-CM | POA: Diagnosis not present

## 2022-05-06 DIAGNOSIS — R2681 Unsteadiness on feet: Secondary | ICD-10-CM | POA: Diagnosis not present

## 2022-05-07 DIAGNOSIS — M6281 Muscle weakness (generalized): Secondary | ICD-10-CM | POA: Diagnosis not present

## 2022-05-07 DIAGNOSIS — R2681 Unsteadiness on feet: Secondary | ICD-10-CM | POA: Diagnosis not present

## 2022-05-07 DIAGNOSIS — R279 Unspecified lack of coordination: Secondary | ICD-10-CM | POA: Diagnosis not present

## 2022-05-08 DIAGNOSIS — R279 Unspecified lack of coordination: Secondary | ICD-10-CM | POA: Diagnosis not present

## 2022-05-08 DIAGNOSIS — M6281 Muscle weakness (generalized): Secondary | ICD-10-CM | POA: Diagnosis not present

## 2022-05-08 DIAGNOSIS — R2681 Unsteadiness on feet: Secondary | ICD-10-CM | POA: Diagnosis not present

## 2022-05-09 DIAGNOSIS — R2681 Unsteadiness on feet: Secondary | ICD-10-CM | POA: Diagnosis not present

## 2022-05-09 DIAGNOSIS — M6281 Muscle weakness (generalized): Secondary | ICD-10-CM | POA: Diagnosis not present

## 2022-05-09 DIAGNOSIS — R279 Unspecified lack of coordination: Secondary | ICD-10-CM | POA: Diagnosis not present

## 2022-05-10 DIAGNOSIS — M6281 Muscle weakness (generalized): Secondary | ICD-10-CM | POA: Diagnosis not present

## 2022-05-10 DIAGNOSIS — R279 Unspecified lack of coordination: Secondary | ICD-10-CM | POA: Diagnosis not present

## 2022-05-10 DIAGNOSIS — R2681 Unsteadiness on feet: Secondary | ICD-10-CM | POA: Diagnosis not present

## 2022-05-13 DIAGNOSIS — R279 Unspecified lack of coordination: Secondary | ICD-10-CM | POA: Diagnosis not present

## 2022-05-13 DIAGNOSIS — M6281 Muscle weakness (generalized): Secondary | ICD-10-CM | POA: Diagnosis not present

## 2022-05-13 DIAGNOSIS — R2681 Unsteadiness on feet: Secondary | ICD-10-CM | POA: Diagnosis not present

## 2022-05-14 DIAGNOSIS — M6281 Muscle weakness (generalized): Secondary | ICD-10-CM | POA: Diagnosis not present

## 2022-05-14 DIAGNOSIS — R279 Unspecified lack of coordination: Secondary | ICD-10-CM | POA: Diagnosis not present

## 2022-05-14 DIAGNOSIS — R2681 Unsteadiness on feet: Secondary | ICD-10-CM | POA: Diagnosis not present

## 2022-05-15 DIAGNOSIS — R2681 Unsteadiness on feet: Secondary | ICD-10-CM | POA: Diagnosis not present

## 2022-05-15 DIAGNOSIS — M6281 Muscle weakness (generalized): Secondary | ICD-10-CM | POA: Diagnosis not present

## 2022-05-15 DIAGNOSIS — R279 Unspecified lack of coordination: Secondary | ICD-10-CM | POA: Diagnosis not present

## 2022-05-17 DIAGNOSIS — M6281 Muscle weakness (generalized): Secondary | ICD-10-CM | POA: Diagnosis not present

## 2022-05-17 DIAGNOSIS — R279 Unspecified lack of coordination: Secondary | ICD-10-CM | POA: Diagnosis not present

## 2022-05-17 DIAGNOSIS — R2681 Unsteadiness on feet: Secondary | ICD-10-CM | POA: Diagnosis not present

## 2022-05-18 DIAGNOSIS — R2681 Unsteadiness on feet: Secondary | ICD-10-CM | POA: Diagnosis not present

## 2022-05-18 DIAGNOSIS — M6281 Muscle weakness (generalized): Secondary | ICD-10-CM | POA: Diagnosis not present

## 2022-05-18 DIAGNOSIS — R279 Unspecified lack of coordination: Secondary | ICD-10-CM | POA: Diagnosis not present

## 2022-05-20 DIAGNOSIS — R279 Unspecified lack of coordination: Secondary | ICD-10-CM | POA: Diagnosis not present

## 2022-05-20 DIAGNOSIS — R2681 Unsteadiness on feet: Secondary | ICD-10-CM | POA: Diagnosis not present

## 2022-05-20 DIAGNOSIS — M6281 Muscle weakness (generalized): Secondary | ICD-10-CM | POA: Diagnosis not present

## 2022-05-21 DIAGNOSIS — M6281 Muscle weakness (generalized): Secondary | ICD-10-CM | POA: Diagnosis not present

## 2022-05-21 DIAGNOSIS — R279 Unspecified lack of coordination: Secondary | ICD-10-CM | POA: Diagnosis not present

## 2022-05-21 DIAGNOSIS — R2681 Unsteadiness on feet: Secondary | ICD-10-CM | POA: Diagnosis not present

## 2022-05-22 DIAGNOSIS — R279 Unspecified lack of coordination: Secondary | ICD-10-CM | POA: Diagnosis not present

## 2022-05-22 DIAGNOSIS — R2681 Unsteadiness on feet: Secondary | ICD-10-CM | POA: Diagnosis not present

## 2022-05-22 DIAGNOSIS — M6281 Muscle weakness (generalized): Secondary | ICD-10-CM | POA: Diagnosis not present

## 2022-05-23 DIAGNOSIS — R279 Unspecified lack of coordination: Secondary | ICD-10-CM | POA: Diagnosis not present

## 2022-05-23 DIAGNOSIS — M6281 Muscle weakness (generalized): Secondary | ICD-10-CM | POA: Diagnosis not present

## 2022-05-23 DIAGNOSIS — R2681 Unsteadiness on feet: Secondary | ICD-10-CM | POA: Diagnosis not present

## 2022-05-24 DIAGNOSIS — R2681 Unsteadiness on feet: Secondary | ICD-10-CM | POA: Diagnosis not present

## 2022-05-24 DIAGNOSIS — M6281 Muscle weakness (generalized): Secondary | ICD-10-CM | POA: Diagnosis not present

## 2022-05-24 DIAGNOSIS — R279 Unspecified lack of coordination: Secondary | ICD-10-CM | POA: Diagnosis not present

## 2022-05-27 DIAGNOSIS — M6281 Muscle weakness (generalized): Secondary | ICD-10-CM | POA: Diagnosis not present

## 2022-05-27 DIAGNOSIS — R2681 Unsteadiness on feet: Secondary | ICD-10-CM | POA: Diagnosis not present

## 2022-05-27 DIAGNOSIS — R279 Unspecified lack of coordination: Secondary | ICD-10-CM | POA: Diagnosis not present

## 2022-05-28 DIAGNOSIS — R2681 Unsteadiness on feet: Secondary | ICD-10-CM | POA: Diagnosis not present

## 2022-05-28 DIAGNOSIS — R279 Unspecified lack of coordination: Secondary | ICD-10-CM | POA: Diagnosis not present

## 2022-05-28 DIAGNOSIS — M6281 Muscle weakness (generalized): Secondary | ICD-10-CM | POA: Diagnosis not present

## 2022-05-29 DIAGNOSIS — R279 Unspecified lack of coordination: Secondary | ICD-10-CM | POA: Diagnosis not present

## 2022-05-29 DIAGNOSIS — M6281 Muscle weakness (generalized): Secondary | ICD-10-CM | POA: Diagnosis not present

## 2022-05-29 DIAGNOSIS — R2681 Unsteadiness on feet: Secondary | ICD-10-CM | POA: Diagnosis not present

## 2022-05-30 DIAGNOSIS — R279 Unspecified lack of coordination: Secondary | ICD-10-CM | POA: Diagnosis not present

## 2022-05-30 DIAGNOSIS — M6281 Muscle weakness (generalized): Secondary | ICD-10-CM | POA: Diagnosis not present

## 2022-05-30 DIAGNOSIS — R2681 Unsteadiness on feet: Secondary | ICD-10-CM | POA: Diagnosis not present

## 2022-05-31 DIAGNOSIS — R279 Unspecified lack of coordination: Secondary | ICD-10-CM | POA: Diagnosis not present

## 2022-05-31 DIAGNOSIS — M6281 Muscle weakness (generalized): Secondary | ICD-10-CM | POA: Diagnosis not present

## 2022-05-31 DIAGNOSIS — R2681 Unsteadiness on feet: Secondary | ICD-10-CM | POA: Diagnosis not present

## 2022-06-03 DIAGNOSIS — R2681 Unsteadiness on feet: Secondary | ICD-10-CM | POA: Diagnosis not present

## 2022-06-03 DIAGNOSIS — R279 Unspecified lack of coordination: Secondary | ICD-10-CM | POA: Diagnosis not present

## 2022-06-03 DIAGNOSIS — M6281 Muscle weakness (generalized): Secondary | ICD-10-CM | POA: Diagnosis not present

## 2022-06-12 DIAGNOSIS — I4891 Unspecified atrial fibrillation: Secondary | ICD-10-CM | POA: Diagnosis not present

## 2022-06-12 DIAGNOSIS — I10 Essential (primary) hypertension: Secondary | ICD-10-CM | POA: Diagnosis not present

## 2022-06-12 DIAGNOSIS — E785 Hyperlipidemia, unspecified: Secondary | ICD-10-CM | POA: Diagnosis not present

## 2022-06-12 DIAGNOSIS — M1990 Unspecified osteoarthritis, unspecified site: Secondary | ICD-10-CM | POA: Diagnosis not present

## 2022-06-12 DIAGNOSIS — I639 Cerebral infarction, unspecified: Secondary | ICD-10-CM | POA: Diagnosis not present

## 2022-06-27 ENCOUNTER — Emergency Department: Payer: Medicare HMO

## 2022-06-27 ENCOUNTER — Emergency Department
Admission: EM | Admit: 2022-06-27 | Discharge: 2022-06-27 | Disposition: A | Discharge status: Home / Self Care | Payer: Medicare HMO | Attending: Emergency Medicine | Admitting: Emergency Medicine

## 2022-06-27 ENCOUNTER — Other Ambulatory Visit: Payer: Self-pay

## 2022-06-27 DIAGNOSIS — F0390 Unspecified dementia without behavioral disturbance: Secondary | ICD-10-CM | POA: Insufficient documentation

## 2022-06-27 DIAGNOSIS — I10 Essential (primary) hypertension: Secondary | ICD-10-CM | POA: Diagnosis not present

## 2022-06-27 DIAGNOSIS — R531 Weakness: Secondary | ICD-10-CM | POA: Diagnosis not present

## 2022-06-27 DIAGNOSIS — R404 Transient alteration of awareness: Secondary | ICD-10-CM | POA: Diagnosis not present

## 2022-06-27 DIAGNOSIS — Z20822 Contact with and (suspected) exposure to covid-19: Secondary | ICD-10-CM | POA: Diagnosis not present

## 2022-06-27 DIAGNOSIS — G8190 Hemiplegia, unspecified affecting unspecified side: Secondary | ICD-10-CM | POA: Diagnosis not present

## 2022-06-27 DIAGNOSIS — R69 Illness, unspecified: Secondary | ICD-10-CM | POA: Diagnosis not present

## 2022-06-27 DIAGNOSIS — N390 Urinary tract infection, site not specified: Secondary | ICD-10-CM

## 2022-06-27 DIAGNOSIS — R4182 Altered mental status, unspecified: Secondary | ICD-10-CM | POA: Diagnosis not present

## 2022-06-27 DIAGNOSIS — Z743 Need for continuous supervision: Secondary | ICD-10-CM | POA: Diagnosis not present

## 2022-06-27 LAB — CBC
HCT: 41.1 % (ref 36.0–46.0)
Hemoglobin: 13.3 g/dL (ref 12.0–15.0)
MCH: 28.9 pg (ref 26.0–34.0)
MCHC: 32.4 g/dL (ref 30.0–36.0)
MCV: 89.3 fL (ref 80.0–100.0)
Platelets: 337 10*3/uL (ref 150–400)
RBC: 4.6 MIL/uL (ref 3.87–5.11)
RDW: 14.2 % (ref 11.5–15.5)
WBC: 8.1 10*3/uL (ref 4.0–10.5)
nRBC: 0 % (ref 0.0–0.2)

## 2022-06-27 LAB — COMPREHENSIVE METABOLIC PANEL
ALT: 9 U/L (ref 0–44)
AST: 18 U/L (ref 15–41)
Albumin: 3.7 g/dL (ref 3.5–5.0)
Alkaline Phosphatase: 86 U/L (ref 38–126)
Anion gap: 6 (ref 5–15)
BUN: 21 mg/dL (ref 8–23)
CO2: 27 mmol/L (ref 22–32)
Calcium: 9.3 mg/dL (ref 8.9–10.3)
Chloride: 110 mmol/L (ref 98–111)
Creatinine, Ser: 0.68 mg/dL (ref 0.44–1.00)
GFR, Estimated: >60 mL/min (ref >60)
Glucose, Bld: 83 mg/dL (ref 70–99)
Potassium: 3.9 mmol/L (ref 3.5–5.1)
Sodium: 143 mmol/L (ref 135–145)
Total Bilirubin: 1 mg/dL (ref 0.3–1.2)
Total Protein: 6.9 g/dL (ref 6.5–8.1)

## 2022-06-27 LAB — URINALYSIS, COMPLETE (UACMP) WITH MICROSCOPIC
Bilirubin Urine: NEGATIVE
Glucose, UA: NEGATIVE mg/dL
Hgb urine dipstick: NEGATIVE
Ketones, ur: NEGATIVE mg/dL
Nitrite: POSITIVE — AB
Protein, ur: NEGATIVE mg/dL
Specific Gravity, Urine: 1.014 (ref 1.005–1.030)
pH: 7 (ref 5.0–8.0)

## 2022-06-27 LAB — SARS CORONAVIRUS 2 BY RT PCR: SARS Coronavirus 2 by RT PCR: NEGATIVE

## 2022-06-27 LAB — TROPONIN I (HIGH SENSITIVITY): Troponin I (High Sensitivity): 11 ng/L (ref <18)

## 2022-06-27 MED ORDER — FENTANYL CITRATE PF 50 MCG/ML IJ SOSY
50.0000 ug | PREFILLED_SYRINGE | Freq: Once | INTRAMUSCULAR | Status: AC
  Administered 2022-06-27: 50 ug via INTRAVENOUS
  Filled 2022-06-27: qty 1

## 2022-06-27 MED ORDER — ACETAMINOPHEN 500 MG PO TABS
1000.0000 mg | ORAL_TABLET | Freq: Once | ORAL | Status: AC
  Administered 2022-06-27: 1000 mg via ORAL
  Filled 2022-06-27: qty 2

## 2022-06-27 MED ORDER — CEPHALEXIN 500 MG PO CAPS: 500.0000 mg | ORAL_CAPSULE | Freq: Two times a day (BID) | ORAL | 0 refills | Status: DC

## 2022-06-27 MED ORDER — SODIUM CHLORIDE 0.9 % IV SOLN
1.0000 g | Freq: Once | INTRAVENOUS | Status: AC
  Administered 2022-06-27: 1 g via INTRAVENOUS
  Filled 2022-06-27: qty 10

## 2022-06-27 NOTE — ED Provider Notes (Signed)
[facility] Provider Note    Event Date/Time   First MD Initiated Contact with Patient 06/27/22 1028     (approximate)  History   Chief Complaint: Altered Mental Status  HPI  [name] is a 81 y.o. female with a past medical history of hypertension, dementia, presents to the emergency department for altered mental status.  According to the family member who is here with the patient patient lives at a nursing facility and she was called this morning for altered mental status.  According to the nursing report they state that the patient was less responsive this morning, was not answering questions when asked and normally would do so.  Family member states the patient is normally confused and gets agitated at times such as she is currently in the emergency department.  Daughter states last time the patient was not responding to people she ended up having a stroke which is what she is concerned about today although states that the patient is acting close to her baseline currently.  Physical Exam   Triage Vital Signs: ED Triage Vitals [06/27/22 1006]  Enc Vitals Group     BP (!) 132/108     Pulse Rate (!) 51     Resp 20     Temp (!) 96.9 F (36.1 C)     Temp Source Axillary     SpO2 95 %     Weight      Height      Head Circumference      [facility] Flow      Pain Score      Pain Loc      Pain Edu?      Excl. in [place]?     Most recent vital signs: Vitals:   06/27/22 1006  BP: (!) 132/108  Pulse: (!) 51  Resp: 20  Temp: (!) 96.9 F (36.1 C)  SpO2: 95%    General: Awake, patient pulling at wires for her ECG leads, etc.  She responds to me able to tell me her name when I asked her.  Was not able to provide any meaningful history however.  Family who is here states this is baseline for the patient. CV:  Good peripheral perfusion.  Regular rate and rhythm  Resp:  Normal effort.  Equal breath sounds bilaterally.  Abd:  No distention.  Soft, nontender.  No  rebound or guarding. Other:  Moving all extremities.  ED Results / Procedures / Treatments   EKG  EKG viewed and interpreted by myself appear to show atrial fibrillation around 56 bpm with a slightly widened QRS, normal axis, normal intervals nonspecific ST changes without ST elevation.  RADIOLOGY  I have reviewed and interpreted the chest x-ray.  No obvious consolidation or edema noted on my evaluation. Radiology is read the x-ray is cardiomegaly but no acute process. CT scan head is negative for acute infarct but does show several areas of more remote infarct.   MEDICATIONS ORDERED IN ED: Medications  fentaNYL (SUBLIMAZE) injection 50 mcg (50 mcg Intravenous Given 06/27/22 1116)     IMPRESSION / MDM / ASSESSMENT AND PLAN / ED COURSE  I reviewed the triage vital signs and the nursing notes.  Patient's presentation is most consistent with acute presentation with potential threat to life or bodily function.  Patient presents emergency department with worsening confusion/altered mental state this morning.  Currently patient appears well, somewhat agitated at times however family member states this is fairly typical for  her.  Appears to be largely at her baseline currently.  Reassuring vitals, reassuring physical exam no obvious deficits.  Blood work is largely nonrevealing with a normal CBC and a normal chemistry.  We will obtain a urine sample as well as a CT scan of the head and chest x-ray to evaluate for any causes of confusion.  Family member agreeable to plan of care.  Patient's CT scan shows multiple old infarcts but no obvious acute infarct.  No bleed.  Patient is already on anticoagulation I do not believe further MRI is necessarily warranted as the patient is very hard to keep still and would likely need to be significantly sedated prior to MRI and I do not believe the MRI findings would change the outcome or treatment plan for the patient.  Patient's urinalysis has resulted  showing a urinary tract infection which could be contributing to the patient's presentation today.  We will treat with antibiotics.  I have updated the family member they are agreeable to this plan and to have the patient return to the nursing facility.  They will return or follow-up with her doctor in the next several days if the patient does not improve although currently the patient appears well.  FINAL CLINICAL IMPRESSION(S) / ED DIAGNOSES   Altered mental status Dementia   Note:  This document was prepared using Dragon voice recognition software and may include unintentional dictation errors.   Harvest Dark, MD 06/27/22 [phone]

## 2022-06-27 NOTE — ED Notes (Signed)
Patient refused to have discharge vitals taken

## 2022-06-27 NOTE — ED Triage Notes (Signed)
Payient brought in via ems from [facility]. EMS called for patient not following commands and altered. In triage patient moving in bed not following commands. Ems denies report of falls. Patient hx of dementia and on blood thinners

## 2022-06-27 NOTE — ED Notes (Signed)
Patient uncooperative when attempting vitals. MD approved to not upset patient with tasks

## 2022-07-01 DIAGNOSIS — Z79899 Other long term (current) drug therapy: Secondary | ICD-10-CM | POA: Diagnosis not present

## 2022-07-01 DIAGNOSIS — E785 Hyperlipidemia, unspecified: Secondary | ICD-10-CM | POA: Diagnosis not present

## 2022-07-01 DIAGNOSIS — R69 Illness, unspecified: Secondary | ICD-10-CM | POA: Diagnosis not present

## 2022-07-01 DIAGNOSIS — R4182 Altered mental status, unspecified: Secondary | ICD-10-CM | POA: Diagnosis not present

## 2022-07-01 DIAGNOSIS — I10 Essential (primary) hypertension: Secondary | ICD-10-CM | POA: Diagnosis not present

## 2022-07-01 DIAGNOSIS — I4891 Unspecified atrial fibrillation: Secondary | ICD-10-CM | POA: Diagnosis not present

## 2022-07-01 DIAGNOSIS — N390 Urinary tract infection, site not specified: Secondary | ICD-10-CM | POA: Diagnosis not present

## 2022-07-01 DIAGNOSIS — I639 Cerebral infarction, unspecified: Secondary | ICD-10-CM | POA: Diagnosis not present

## 2022-08-13 DIAGNOSIS — M25562 Pain in left knee: Secondary | ICD-10-CM | POA: Diagnosis not present

## 2022-08-13 DIAGNOSIS — I4891 Unspecified atrial fibrillation: Secondary | ICD-10-CM | POA: Diagnosis not present

## 2022-08-13 DIAGNOSIS — I10 Essential (primary) hypertension: Secondary | ICD-10-CM | POA: Diagnosis not present

## 2022-08-13 DIAGNOSIS — E785 Hyperlipidemia, unspecified: Secondary | ICD-10-CM | POA: Diagnosis not present

## 2022-09-02 ENCOUNTER — Encounter (INDEPENDENT_AMBULATORY_CARE_PROVIDER_SITE_OTHER): Payer: Self-pay

## 2022-10-15 DIAGNOSIS — E785 Hyperlipidemia, unspecified: Secondary | ICD-10-CM | POA: Diagnosis not present

## 2022-10-15 DIAGNOSIS — I10 Essential (primary) hypertension: Secondary | ICD-10-CM | POA: Diagnosis not present

## 2022-10-15 DIAGNOSIS — M1990 Unspecified osteoarthritis, unspecified site: Secondary | ICD-10-CM | POA: Diagnosis not present

## 2022-10-15 DIAGNOSIS — I639 Cerebral infarction, unspecified: Secondary | ICD-10-CM | POA: Diagnosis not present

## 2022-10-15 DIAGNOSIS — R339 Retention of urine, unspecified: Secondary | ICD-10-CM | POA: Diagnosis not present

## 2022-10-15 DIAGNOSIS — I4891 Unspecified atrial fibrillation: Secondary | ICD-10-CM | POA: Diagnosis not present

## 2022-10-17 DIAGNOSIS — L602 Onychogryphosis: Secondary | ICD-10-CM | POA: Diagnosis not present

## 2022-10-30 DIAGNOSIS — R059 Cough, unspecified: Secondary | ICD-10-CM | POA: Diagnosis not present

## 2022-11-12 DIAGNOSIS — Z7901 Long term (current) use of anticoagulants: Secondary | ICD-10-CM | POA: Diagnosis not present

## 2022-11-12 DIAGNOSIS — E785 Hyperlipidemia, unspecified: Secondary | ICD-10-CM | POA: Diagnosis not present

## 2022-11-14 DIAGNOSIS — E785 Hyperlipidemia, unspecified: Secondary | ICD-10-CM | POA: Diagnosis not present

## 2022-11-15 DIAGNOSIS — Z7901 Long term (current) use of anticoagulants: Secondary | ICD-10-CM | POA: Diagnosis not present

## 2022-12-11 DIAGNOSIS — Z7901 Long term (current) use of anticoagulants: Secondary | ICD-10-CM | POA: Diagnosis not present

## 2022-12-16 DIAGNOSIS — I10 Essential (primary) hypertension: Secondary | ICD-10-CM | POA: Diagnosis not present

## 2022-12-16 DIAGNOSIS — R339 Retention of urine, unspecified: Secondary | ICD-10-CM | POA: Diagnosis not present

## 2022-12-16 DIAGNOSIS — E785 Hyperlipidemia, unspecified: Secondary | ICD-10-CM | POA: Diagnosis not present

## 2022-12-16 DIAGNOSIS — M25562 Pain in left knee: Secondary | ICD-10-CM | POA: Diagnosis not present

## 2022-12-16 DIAGNOSIS — I4891 Unspecified atrial fibrillation: Secondary | ICD-10-CM | POA: Diagnosis not present

## 2022-12-16 DIAGNOSIS — I639 Cerebral infarction, unspecified: Secondary | ICD-10-CM | POA: Diagnosis not present

## 2023-01-09 DIAGNOSIS — R69 Illness, unspecified: Secondary | ICD-10-CM | POA: Diagnosis not present

## 2023-01-09 DIAGNOSIS — R7401 Elevation of levels of liver transaminase levels: Secondary | ICD-10-CM | POA: Diagnosis not present

## 2023-01-09 DIAGNOSIS — Z7901 Long term (current) use of anticoagulants: Secondary | ICD-10-CM | POA: Diagnosis not present

## 2023-01-09 DIAGNOSIS — E785 Hyperlipidemia, unspecified: Secondary | ICD-10-CM | POA: Diagnosis not present

## 2023-01-09 DIAGNOSIS — I10 Essential (primary) hypertension: Secondary | ICD-10-CM | POA: Diagnosis not present

## 2023-01-13 DIAGNOSIS — E785 Hyperlipidemia, unspecified: Secondary | ICD-10-CM | POA: Diagnosis not present

## 2023-02-19 DIAGNOSIS — E785 Hyperlipidemia, unspecified: Secondary | ICD-10-CM | POA: Diagnosis not present

## 2023-02-19 DIAGNOSIS — I10 Essential (primary) hypertension: Secondary | ICD-10-CM | POA: Diagnosis not present

## 2023-02-19 DIAGNOSIS — R69 Illness, unspecified: Secondary | ICD-10-CM | POA: Diagnosis not present

## 2023-02-19 DIAGNOSIS — R339 Retention of urine, unspecified: Secondary | ICD-10-CM | POA: Diagnosis not present

## 2023-02-19 DIAGNOSIS — I639 Cerebral infarction, unspecified: Secondary | ICD-10-CM | POA: Diagnosis not present

## 2023-02-19 DIAGNOSIS — M1990 Unspecified osteoarthritis, unspecified site: Secondary | ICD-10-CM | POA: Diagnosis not present

## 2023-02-19 DIAGNOSIS — I4891 Unspecified atrial fibrillation: Secondary | ICD-10-CM | POA: Diagnosis not present

## 2023-03-11 DIAGNOSIS — N950 Postmenopausal bleeding: Secondary | ICD-10-CM | POA: Diagnosis not present

## 2023-03-12 DIAGNOSIS — I10 Essential (primary) hypertension: Secondary | ICD-10-CM | POA: Diagnosis not present

## 2023-03-12 DIAGNOSIS — N950 Postmenopausal bleeding: Secondary | ICD-10-CM | POA: Diagnosis not present

## 2023-03-13 ENCOUNTER — Ambulatory Visit (INDEPENDENT_AMBULATORY_CARE_PROVIDER_SITE_OTHER): Payer: Medicare HMO | Admitting: Obstetrics and Gynecology

## 2023-03-13 ENCOUNTER — Encounter: Payer: Self-pay | Admitting: Obstetrics and Gynecology

## 2023-03-13 ENCOUNTER — Encounter: Payer: Medicare HMO | Admitting: Obstetrics and Gynecology

## 2023-03-13 ENCOUNTER — Other Ambulatory Visit: Payer: Self-pay | Admitting: Obstetrics and Gynecology

## 2023-03-13 ENCOUNTER — Ambulatory Visit (INDEPENDENT_AMBULATORY_CARE_PROVIDER_SITE_OTHER): Payer: Medicare HMO

## 2023-03-13 VITALS — BP 125/66 | HR 84 | Ht 68.0 in | Wt 180.0 lb

## 2023-03-13 DIAGNOSIS — N939 Abnormal uterine and vaginal bleeding, unspecified: Secondary | ICD-10-CM | POA: Diagnosis not present

## 2023-03-13 DIAGNOSIS — Z7689 Persons encountering health services in other specified circumstances: Secondary | ICD-10-CM | POA: Diagnosis not present

## 2023-03-13 DIAGNOSIS — N950 Postmenopausal bleeding: Secondary | ICD-10-CM

## 2023-03-13 DIAGNOSIS — N390 Urinary tract infection, site not specified: Secondary | ICD-10-CM | POA: Diagnosis not present

## 2023-03-13 NOTE — Progress Notes (Signed)
GYNECOLOGY PROGRESS NOTE  Subjective:    Patient ID: [name], female    DOB: 03-May-1941, 82 y.o.   MRN: [id number]  HPI  Patient is a 82 y.o. G3P3 female who presents for vaginal bleeding.  She was referred by her PCP due to postmenopausal bleeding for the past 3 days. Bleeding is not heavy but was associated with few small clots. Patient currently lives in a elderly care facility ([facility]).  Patient is a poor historian.  Per caregiver, patient was evaluated for rectal bleeding, and had UA performed this morning at the facility to test for UTI.     Past Medical History:  Diagnosis Date   Cat bite of right lower leg [id number]   Cellulitis of lower leg 12/07/2014   Hypertension     Family History  Problem Relation Age of Onset   Hypertension Mother    Alcohol abuse Father    Heart disease Father    Hypertension Father    Hyperlipidemia Sister    Hypertension Sister    Alcohol abuse Brother    Hypertension Brother     Past Surgical History:  Procedure Laterality Date   IR CT HEAD LTD  06/09/2021   IR PERCUTANEOUS ART THROMBECTOMY/INFUSION INTRACRANIAL INC DIAG ANGIO  06/09/2021   RADIOLOGY WITH ANESTHESIA N/A 06/09/2021   Procedure: IR WITH ANESTHESIA;  Surgeon: Radiologist, Medication, MD;  Location: [facility] OR;  Service: Radiology;  Laterality: N/A;    Social History   Socioeconomic History   Marital status: Widowed    Spouse name: Not on file   Number of children: Not on file   Years of education: Not on file   Highest education level: High school graduate  Occupational History   Occupation: retired   Tobacco Use   Smoking status: Former    Types: Cigarettes    Quit date: 11/04/1978    Years since quitting: 44.3   Smokeless tobacco: Never  Vaping Use   Vaping Use: Never used  Substance and Sexual Activity   Alcohol use: No   Drug use: No   Sexual activity: Never  Other Topics Concern   Not on file  Social History Narrative   Not on file    Social Determinants of Health   Financial Resource Strain: Low Risk  (08/27/2018)   Overall Financial Resource Strain (CARDIA)    Difficulty of Paying Living Expenses: Not hard at all  Food Insecurity: No Food Insecurity (04/26/2021)   Hunger Vital Sign    Worried About Running Out of Food in the Last Year: Never true    Ran Out of Food in the Last Year: Never true  Transportation Needs: No Transportation Needs (04/26/2021)   PRAPARE - Administrator, Civil Service (Medical): No    Lack of Transportation (Non-Medical): No  Physical Activity: Inactive (08/27/2018)   Exercise Vital Sign    Days of Exercise per Week: 0 days    Minutes of Exercise per Session: 0 min  Stress: No Stress Concern Present (08/27/2018)   [employer] of Occupational Health - Occupational Stress Questionnaire    Feeling of Stress : Not at all  Social Connections: Moderately Isolated (08/27/2018)   Social Connection and Isolation Panel [NHANES]    Frequency of Communication with Friends and Family: Once a week    Frequency of Social Gatherings with Friends and Family: Once a week    Attends Religious Services: More than 4 times per year  Active Member of Clubs or Organizations: No    Attends Banker Meetings: Never    Marital Status: Widowed  Intimate Partner Violence: Not At Risk (08/27/2018)   Humiliation, Afraid, Rape, and Kick questionnaire    Fear of Current or Ex-Partner: No    Emotionally Abused: No    Physically Abused: No    Sexually Abused: No    Current Outpatient Medications on File Prior to Visit  Medication Sig Dispense Refill   acetaminophen (TYLENOL) 325 MG tablet Take 2 tablets (650 mg total) by mouth every 4 (four) hours as needed for mild pain (or temp > 37.5 C (99.5 F)).     atorvastatin (LIPITOR) 20 MG tablet Take 20 mg by mouth daily.     atorvastatin (LIPITOR) 40 MG tablet Take 1 tablet (40 mg total) by mouth at bedtime. 90 tablet 1   cephALEXin  (KEFLEX) 500 MG capsule Take 1 capsule (500 mg total) by mouth 2 (two) times daily. 14 capsule 0   clonazepam (KLONOPIN) 0.125 MG disintegrating tablet Take 1 tablet (0.125 mg total) by mouth 2 (two) times daily. 60 tablet 0   divalproex (DEPAKOTE SPRINKLE) 125 MG capsule Take 2 capsules (250 mg total) by mouth every 12 (twelve) hours.     propranolol (INDERAL) 10 MG tablet Take 1 tablet (10 mg total) by mouth 3 (three) times daily.     QUEtiapine (SEROQUEL) 50 MG tablet Take 1 tablet (50 mg total) by mouth 3 (three) times daily.     rivaroxaban (XARELTO) 20 MG TABS tablet Take 1 tablet (20 mg total) by mouth daily with supper. 30 tablet    senna-docusate (SENOKOT-S) 8.6-50 MG tablet Take 1 tablet by mouth at bedtime as needed for moderate constipation or mild constipation.     tamsulosin (FLOMAX) 0.4 MG CAPS capsule Take 1 capsule (0.4 mg total) by mouth daily after supper. 30 capsule    No current facility-administered medications on file prior to visit.    Allergies  Allergen Reactions   Amlodipine Other (See Comments)    Leg swelling   Haldol [Haloperidol Lactate] Other (See Comments)    Neuroleptic Malignant syndrome    Hctz [Hydrochlorothiazide] Nausea Only and Rash     Review of Systems Pertinent items are noted in HPI.   Objective:   Blood pressure 125/66, pulse 84, height 5\' 8"  (1.727 m), weight 180 lb (81.6 kg).  Body mass index is 27.37 kg/m. General appearance: alert, cooperative, and no distress Abdomen: soft, non-tender; bowel sounds normal; no masses,  no organomegaly Pelvic: external genitalia normal, rectovaginal septum normal.  Vagina without discharge. Slightly difficult exam due to patient's limited mobility and mild cognitive impairment.  Cervix? flushed to vaginal wall, difficult to tell if it is actually present.  Unable to palpate uterus or adnexae, but if present is not enlarged. No tenderness.  Extremities: extremities normal, atraumatic, no cyanosis or  edema Neurologic: Grossly normal   Assessment:   1. Establishing care with new doctor, encounter for   2. PMB (postmenopausal bleeding)      Plan:   Discussed etiologies of postmenopausal bleeding, concern about precancerous/hyperplasia or cancerous etiology (5 to 10% percent of cases). Endometrial atrophy and endometrial polyps are the most common causes of postmenopausal bleeding.  Uterine bleeding in postmenopausal women is usually light and self-limited. The primary goal in the diagnostic evaluation of postmenopausal women with uterine bleeding is to exclude malignancy; this can include endometrial biopsy and pelvic ultrasound.   Further diagnostic evaluation  is indicated for recurrent or persistent bleeding.  Ultrasound ordered for after today's visit.     Hildred Laser, MD [facility]

## 2023-03-17 ENCOUNTER — Telehealth: Payer: Self-pay

## 2023-03-17 DIAGNOSIS — R319 Hematuria, unspecified: Secondary | ICD-10-CM | POA: Diagnosis not present

## 2023-03-17 DIAGNOSIS — N390 Urinary tract infection, site not specified: Secondary | ICD-10-CM | POA: Diagnosis not present

## 2023-03-17 NOTE — Telephone Encounter (Signed)
Pts daughter in law calling stating [facility] was needing two papers faxed to them [phone]. It will go to her nurse, so no cover sheet needed. The papers was left last Thursday at the time of visit. [facility] was expecting the papers faxed by Friday. Pts daughter in law aware Valentino Saxon and her Nurse are both out of the office today.

## 2023-03-22 NOTE — Telephone Encounter (Signed)
I completed the forms that were provided by [facility] regarding her visit at the time that she was there (a progress note with recommendations).  There were no other papers that I am aware of. If they could fax any other required forms to our office I will be sure to have them completed

## 2023-03-24 NOTE — Telephone Encounter (Signed)
Called patient daughter to let her know about forms per Dr. Valentino Saxon, there was no answer, I left a vm.

## 2023-04-14 ENCOUNTER — Other Ambulatory Visit: Payer: Self-pay

## 2023-04-14 DIAGNOSIS — R310 Gross hematuria: Secondary | ICD-10-CM

## 2023-04-15 ENCOUNTER — Encounter: Payer: Self-pay | Admitting: Urology

## 2023-04-15 ENCOUNTER — Ambulatory Visit (INDEPENDENT_AMBULATORY_CARE_PROVIDER_SITE_OTHER): Payer: Medicare HMO | Admitting: Urology

## 2023-04-15 DIAGNOSIS — R310 Gross hematuria: Secondary | ICD-10-CM

## 2023-04-15 NOTE — Progress Notes (Signed)
   04/15/2023 9:00 AM   [name] 01/09/1941 [id number]  Reason for visit: Follow up gross hematuria, possible UTI  HPI: I saw [name] and her daughter-in-law for follow-up of possible UTI and gross hematuria.  The history is obtained entirely from her daughter-in-law, patient is minimally verbal secondary to stroke.  I saw them last in August 2022 when she had gross hematuria and questionable UTI, and we opted to treat the UTI with follow-up for repeat UA to consider CT and cystoscopy if persistent hematuria.  It sounds like she had a recurrent stroke shortly after that visit with a prolonged hospitalization, and has been in rehab since that time.  They have noticed some suspected recurrent gross hematuria.  She really denies any urinary complaints aside from incontinence.  She is unable to give a urinalysis today.  She was evaluated by OB/GYN in early May 2024 for possible vaginal bleeding, and pelvic ultrasound at that time showed a thickened bladder wall but was otherwise benign.  We discussed common possible etiologies of hematuria including malignancy, urolithiasis, medical renal disease, and idiopathic. Standard workup recommended by the AUA includes imaging with CT urogram to assess the upper tracts, and cystoscopy. Cytology is performed on patient's with gross hematuria to look for malignant cells in the urine.  Using shared decision making, they would like to defer cystoscopy at this time but are amenable to CT urogram.  We discussed possible need for additional testing or workup pending CT findings.  CT urogram, will call daughter-in-law with results   Sondra Come, MD  [facility] [address] [place], [place] [address] [phone]

## 2023-04-21 DIAGNOSIS — R319 Hematuria, unspecified: Secondary | ICD-10-CM | POA: Diagnosis not present

## 2023-04-21 DIAGNOSIS — R339 Retention of urine, unspecified: Secondary | ICD-10-CM | POA: Diagnosis not present

## 2023-04-21 DIAGNOSIS — I4891 Unspecified atrial fibrillation: Secondary | ICD-10-CM | POA: Diagnosis not present

## 2023-04-21 DIAGNOSIS — E785 Hyperlipidemia, unspecified: Secondary | ICD-10-CM | POA: Diagnosis not present

## 2023-04-24 ENCOUNTER — Ambulatory Visit
Admission: RE | Admit: 2023-04-24 | Discharge: 2023-04-24 | Disposition: A | Discharge status: Home / Self Care | Payer: Medicare HMO | Source: Ambulatory Visit | Attending: Urology | Admitting: Urology

## 2023-04-24 DIAGNOSIS — R310 Gross hematuria: Secondary | ICD-10-CM | POA: Insufficient documentation

## 2023-04-24 DIAGNOSIS — R319 Hematuria, unspecified: Secondary | ICD-10-CM | POA: Diagnosis not present

## 2023-04-24 DIAGNOSIS — K5730 Diverticulosis of large intestine without perforation or abscess without bleeding: Secondary | ICD-10-CM | POA: Diagnosis not present

## 2023-04-24 LAB — POCT I-STAT CREATININE: Creatinine, Ser: 0.6 mg/dL (ref 0.44–1.00)

## 2023-04-24 MED ORDER — IOHEXOL 300 MG/ML  SOLN
100.0000 mL | Freq: Once | INTRAMUSCULAR | Status: AC | PRN
  Administered 2023-04-24: 100 mL via INTRAVENOUS

## 2023-05-01 DIAGNOSIS — R319 Hematuria, unspecified: Secondary | ICD-10-CM | POA: Diagnosis not present

## 2023-05-01 DIAGNOSIS — Z7189 Other specified counseling: Secondary | ICD-10-CM | POA: Diagnosis not present

## 2023-05-01 DIAGNOSIS — N2889 Other specified disorders of kidney and ureter: Secondary | ICD-10-CM | POA: Diagnosis not present

## 2023-05-01 DIAGNOSIS — R9389 Abnormal findings on diagnostic imaging of other specified body structures: Secondary | ICD-10-CM | POA: Diagnosis not present

## 2023-05-05 ENCOUNTER — Other Ambulatory Visit: Payer: Self-pay

## 2023-05-05 ENCOUNTER — Telehealth: Payer: Self-pay | Admitting: Urology

## 2023-05-05 DIAGNOSIS — N2889 Other specified disorders of kidney and ureter: Secondary | ICD-10-CM

## 2023-05-05 NOTE — Telephone Encounter (Signed)
UROLOGY TELEPHONE NOTE  82 year old comorbid female with history of stroke, minimally verbal and history obtained entirely from her daughter-in-law Cristie Hem, see last clinic note 04/15/2023 for full history  Seen for questionable gross hematuria, patient unable to give urinalysis, question if this is vaginal bleeding versus hematuria.  Using shared decision making they opted for a CT urogram, but deferred cystoscopy and cytology.  I personally viewed and interpreted the CT, this shows a Bosniak 4 left-sided renal mass measuring 3.5 cm that is exophytic.  We had a long conversation about the natural history of small renal masses, especially in older patients with significant comorbidities and I strongly recommended active surveillance with a repeat CT in 6 months.  Based on the location of this lesion I do not feel this is contributing to gross hematuria.  Also seen on CT is air extending into the vagina and endometrium, and would defer to GYN for further evaluation or workup, already had a benign pelvic ultrasound with Dr. Valentino Saxon.  We discussed possibility of missing additional pathology by deferring cystoscopy and cytology, and they would like to defer any procedures at this time.  Return precautions were discussed at length.  RTC 6 months CT prior for active surveillance of left-sided renal mass  Legrand Rams, MD 05/05/2023

## 2023-06-02 DIAGNOSIS — R319 Hematuria, unspecified: Secondary | ICD-10-CM | POA: Diagnosis not present

## 2023-06-03 ENCOUNTER — Other Ambulatory Visit: Payer: Self-pay

## 2023-06-03 ENCOUNTER — Telehealth: Payer: Self-pay

## 2023-06-03 ENCOUNTER — Emergency Department
Admission: EM | Admit: 2023-06-03 | Discharge: 2023-06-03 | Disposition: A | Discharge status: Home / Self Care | Payer: Medicare HMO | Attending: Emergency Medicine | Admitting: Emergency Medicine

## 2023-06-03 DIAGNOSIS — Z743 Need for continuous supervision: Secondary | ICD-10-CM | POA: Diagnosis not present

## 2023-06-03 DIAGNOSIS — R310 Gross hematuria: Secondary | ICD-10-CM | POA: Diagnosis not present

## 2023-06-03 DIAGNOSIS — R58 Hemorrhage, not elsewhere classified: Secondary | ICD-10-CM | POA: Diagnosis not present

## 2023-06-03 DIAGNOSIS — N939 Abnormal uterine and vaginal bleeding, unspecified: Secondary | ICD-10-CM | POA: Diagnosis not present

## 2023-06-03 DIAGNOSIS — Z7401 Bed confinement status: Secondary | ICD-10-CM | POA: Diagnosis not present

## 2023-06-03 DIAGNOSIS — N390 Urinary tract infection, site not specified: Secondary | ICD-10-CM | POA: Diagnosis not present

## 2023-06-03 DIAGNOSIS — I10 Essential (primary) hypertension: Secondary | ICD-10-CM | POA: Diagnosis not present

## 2023-06-03 DIAGNOSIS — F0390 Unspecified dementia without behavioral disturbance: Secondary | ICD-10-CM | POA: Diagnosis not present

## 2023-06-03 LAB — CBC WITH DIFFERENTIAL/PLATELET
Abs Immature Granulocytes: 0.02 10*3/uL (ref 0.00–0.07)
Basophils Absolute: 0 10*3/uL (ref 0.0–0.1)
Basophils Relative: 1 %
Eosinophils Absolute: 0.4 10*3/uL (ref 0.0–0.5)
Eosinophils Relative: 7 %
HCT: 35.4 % — ABNORMAL LOW (ref 36.0–46.0)
Hemoglobin: 11.6 g/dL — ABNORMAL LOW (ref 12.0–15.0)
Immature Granulocytes: 0 %
Lymphocytes Relative: 37 %
Lymphs Abs: 2.4 10*3/uL (ref 0.7–4.0)
MCH: 29.9 pg (ref 26.0–34.0)
MCHC: 32.8 g/dL (ref 30.0–36.0)
MCV: 91.2 fL (ref 80.0–100.0)
Monocytes Absolute: 0.4 10*3/uL (ref 0.1–1.0)
Monocytes Relative: 7 %
Neutro Abs: 3.1 10*3/uL (ref 1.7–7.7)
Neutrophils Relative %: 48 %
Platelets: 326 10*3/uL (ref 150–400)
RBC: 3.88 MIL/uL (ref 3.87–5.11)
RDW: 13.2 % (ref 11.5–15.5)
WBC: 6.4 10*3/uL (ref 4.0–10.5)
nRBC: 0 % (ref 0.0–0.2)

## 2023-06-03 LAB — COMPREHENSIVE METABOLIC PANEL
ALT: 9 U/L (ref 0–44)
AST: 16 U/L (ref 15–41)
Albumin: 3.2 g/dL — ABNORMAL LOW (ref 3.5–5.0)
Alkaline Phosphatase: 78 U/L (ref 38–126)
Anion gap: 7 (ref 5–15)
BUN: 18 mg/dL (ref 8–23)
CO2: 24 mmol/L (ref 22–32)
Calcium: 8.6 mg/dL — ABNORMAL LOW (ref 8.9–10.3)
Chloride: 109 mmol/L (ref 98–111)
Creatinine, Ser: 0.67 mg/dL (ref 0.44–1.00)
GFR, Estimated: >60 mL/min (ref >60)
Glucose, Bld: 94 mg/dL (ref 70–99)
Potassium: 3.7 mmol/L (ref 3.5–5.1)
Sodium: 140 mmol/L (ref 135–145)
Total Bilirubin: 0.6 mg/dL (ref 0.3–1.2)
Total Protein: 6.2 g/dL — ABNORMAL LOW (ref 6.5–8.1)

## 2023-06-03 LAB — URINALYSIS, ROUTINE W REFLEX MICROSCOPIC
Bacteria, UA: NONE SEEN
RBC / HPF: >50 RBC/hpf (ref 0–5)
Squamous Epithelial / HPF: NONE SEEN /HPF (ref 0–5)
WBC, UA: >50 WBC/hpf (ref 0–5)

## 2023-06-03 LAB — PROTIME-INR
INR: 2.5 — ABNORMAL HIGH (ref 0.8–1.2)
Prothrombin Time: 27.6 seconds — ABNORMAL HIGH (ref 11.4–15.2)

## 2023-06-03 LAB — TYPE AND SCREEN
ABO/RH(D): O POS
Antibody Screen: NEGATIVE

## 2023-06-03 LAB — APTT: aPTT: 44 seconds — ABNORMAL HIGH (ref 24–36)

## 2023-06-03 NOTE — ED Notes (Signed)
Pt set up with breakfast tray as she waits for transport back to facility.

## 2023-06-03 NOTE — ED Triage Notes (Signed)
Pt to ED via [facility] c/o vaginal bleeding. Per nurse at [facility], pt has been bleeding bright red blood out of vagina for 24hrs. Denies any pain. Pt has MOST and DNR form. Pt has hx of dementia A&Ox1.  52HR 114/88

## 2023-06-03 NOTE — Discharge Instructions (Signed)
Although [name] has blood, it appears to be coming from her bladder or kidney rather than from the vagina or from her rectum.  This is similar to her prior issue, even though we understand that the amount of blood seems to be more.  However her blood count level is appropriate and there is no other intervention that is needed at this time.  Please follow-up with her regular doctor and/or with her urologist at the next available opportunity.  Return to the emergency department if you develop new or worsening symptoms that concern you.

## 2023-06-03 NOTE — ED Notes (Signed)
[facility]  CALLED  FOR  TRANSPORT  TO  [facility] 

## 2023-06-03 NOTE — ED Provider Notes (Signed)
[facility] Provider Note    Event Date/Time   First MD Initiated Contact with Patient 06/03/23 [phone]     (approximate)   History   Vaginal Bleeding  Level 5 caveat:  history/ROS limited by chronic dementia  HPI [name] is a 82 y.o. female who presents for evaluation of vaginal bleeding.  She lives at a memory care unit facility and they said that she has had an increase in the amount of vaginal bleeding recently which is similar to a medium menstrual cycle.  The patient has been having intermittent bleeding for several months and there has been an extensive outpatient evaluation to try to determine the source of the bleeding (renal versus bladder versus GYN).  No specific source was previous identified, but recently she had a CT and urology consult that I found out a renal mass and possibly that was the source.  She also had a previously generally reassuring evaluation by Dr. Valentino Saxon with OB/GYN including an ultrasound.  The patient has dementia but is awake and alert.  She is able to answer questions and she reports having no pain.  She does not know why she is here but is pleasant and interactive with ED staff and with her daughter-in-law who is at bedside.     Physical Exam   Triage Vital Signs: ED Triage Vitals  Encounter Vitals Group     BP 06/03/23 0313 (!) 163/93     Systolic BP Percentile --      Diastolic BP Percentile --      Pulse Rate 06/03/23 0313 (!) 48     Resp 06/03/23 0313 14     Temp 06/03/23 0313 98.3 F (36.8 C)     Temp Source 06/03/23 0313 Oral     SpO2 06/03/23 0313 100 %     Weight 06/03/23 0316 80.1 kg (176 lb 9.4 oz)     Height --      Head Circumference --      Peak Flow --      Pain Score 06/03/23 0316 0     Pain Loc --      Pain Education --      Exclude from Growth Chart --     Most recent vital signs: Vitals:   06/03/23 0630 06/03/23 0700  BP: 137/72 126/76  Pulse: (!) 48 (!) 43  Resp: 17 17  Temp: 98 F  (36.7 C)   SpO2: 100%     General: Awake, no distress.  CV:  Good peripheral perfusion.  Bradycardia with regular rhythm. Resp:  Normal effort. Speaking easily and comfortably, no accessory muscle usage nor intercostal retractions.  Lungs clear to auscultation. Abd:  No distention.  No tenderness to palpation of the abdomen. GU:  Patient had a small amount of blood at the vaginal introitus but this was seems to be coming from her urethra given the presence of gross hematuria upon In-and-Out catheterization.   ED Results / Procedures / Treatments   Labs (all labs ordered are listed, but only abnormal results are displayed) Labs Reviewed  CBC WITH DIFFERENTIAL/PLATELET - Abnormal; Notable for the following components:      Result Value   Hemoglobin 11.6 (*)    HCT 35.4 (*)    All other components within normal limits  COMPREHENSIVE METABOLIC PANEL - Abnormal; Notable for the following components:   Calcium 8.6 (*)    Total Protein 6.2 (*)    Albumin 3.2 (*)  All other components within normal limits  PROTIME-INR - Abnormal; Notable for the following components:   Prothrombin Time 27.6 (*)    INR 2.5 (*)    All other components within normal limits  APTT - Abnormal; Notable for the following components:   aPTT 44 (*)    All other components within normal limits  URINALYSIS, ROUTINE W REFLEX MICROSCOPIC - Abnormal; Notable for the following components:   Color, Urine RED (*)    APPearance CLOUDY (*)    Glucose, UA   (*)    Value: TEST NOT REPORTED DUE TO COLOR INTERFERENCE OF URINE PIGMENT   Hgb urine dipstick   (*)    Value: TEST NOT REPORTED DUE TO COLOR INTERFERENCE OF URINE PIGMENT   Bilirubin Urine   (*)    Value: TEST NOT REPORTED DUE TO COLOR INTERFERENCE OF URINE PIGMENT   Ketones, ur   (*)    Value: TEST NOT REPORTED DUE TO COLOR INTERFERENCE OF URINE PIGMENT   Protein, ur   (*)    Value: TEST NOT REPORTED DUE TO COLOR INTERFERENCE OF URINE PIGMENT   Nitrite    (*)    Value: TEST NOT REPORTED DUE TO COLOR INTERFERENCE OF URINE PIGMENT   Leukocytes,Ua   (*)    Value: TEST NOT REPORTED DUE TO COLOR INTERFERENCE OF URINE PIGMENT   All other components within normal limits  TYPE AND SCREEN     EKG  ED ECG REPORT I, Loleta Rose, the attending physician, personally viewed and interpreted this ECG.  Date: 06/03/2023 EKG Time: 3:31 AM Rate: 45 Rhythm: atrial fibrillation QRS Axis: normal Intervals: Abnormal due to A-fib, right bundle branch block and left posterior fascicular block ST/T Wave abnormalities: Non-specific ST segment / T-wave changes, but no clear evidence of acute ischemia. Narrative Interpretation: no definitive evidence of acute ischemia; does not meet STEMI criteria.      PROCEDURES:  Critical Care performed: No  Procedures    IMPRESSION / MDM / ASSESSMENT AND PLAN / ED COURSE  I reviewed the triage vital signs and the nursing notes.                              Differential diagnosis includes, but is not limited to, vaginal bleeding possibly from a neoplastic source, hematuria, GI bleed.  Patient's presentation is most consistent with acute presentation with potential threat to life or bodily function.  Labs/studies ordered: CBC with differential, EKG, CMP, pro time-INR, APTT, type and screen, urinalysis  Interventions/Medications given:  Medications - No data to display  (Note:  hospital course my include additional interventions and/or labs/studies not listed above.)   I reviewed the medical record including notes from Dr. Richardo Hanks (urology) regarding her hematuria and the renal mass that is the likely source of the hematuria.  She has had CT scans that were otherwise reassuring.  She has seen Dr. Valentino Saxon and they did not identify a GYN source of bleeding.  Based upon the evaluation today and her current physical exam with no evidence of symptomatic anemia, stable hemoglobin, and no symptoms or concerns from  the patient, I feel that this is an exacerbation of her chronic hematuria.  There is no indication for additional imaging at this time.  I talked it over with the patient's daughter-in-law who agrees.  The daughter-in-law also stated that she and her husband would not want to put the patient through uncomfortable or aggressive treatment  even if there was a specific source of bleeding identified which I think is very reasonable and appropriate.  Discharging patient back to facility for outpatient follow-up.  I gave my usual return precautions.         FINAL CLINICAL IMPRESSION(S) / ED DIAGNOSES   Final diagnoses:  Gross hematuria     Rx / DC Orders   ED Discharge Orders     None        Note:  This document was prepared using Dragon voice recognition software and may include unintentional dictation errors.   Loleta Rose, MD 06/03/23 [phone]

## 2023-06-03 NOTE — Telephone Encounter (Signed)
LVM for patient to call back [phone], or to call PCP office to schedule follow up apt. AS, CMA  

## 2023-06-05 DIAGNOSIS — R319 Hematuria, unspecified: Secondary | ICD-10-CM | POA: Diagnosis not present

## 2023-06-05 DIAGNOSIS — R52 Pain, unspecified: Secondary | ICD-10-CM | POA: Diagnosis not present

## 2023-06-09 DIAGNOSIS — A498 Other bacterial infections of unspecified site: Secondary | ICD-10-CM | POA: Diagnosis not present

## 2023-06-09 DIAGNOSIS — N390 Urinary tract infection, site not specified: Secondary | ICD-10-CM | POA: Diagnosis not present

## 2023-06-12 DIAGNOSIS — L602 Onychogryphosis: Secondary | ICD-10-CM | POA: Diagnosis not present

## 2023-06-17 ENCOUNTER — Ambulatory Visit: Payer: Medicare HMO | Admitting: Urology

## 2023-06-17 ENCOUNTER — Encounter: Payer: Self-pay | Admitting: Urology

## 2023-06-17 VITALS — Ht 68.0 in | Wt 176.0 lb

## 2023-06-17 DIAGNOSIS — N2889 Other specified disorders of kidney and ureter: Secondary | ICD-10-CM

## 2023-06-17 DIAGNOSIS — R310 Gross hematuria: Secondary | ICD-10-CM

## 2023-06-17 NOTE — Progress Notes (Signed)
   06/17/2023 1:14 PM   [name] Nov 07, 1940 [id number]  Reason for visit: Follow up vaginal bleeding versus gross hematuria, renal mass  HPI: 82 year old comorbid female who I saw in June 2024 for either gross hematuria or vaginal bleeding, history very challenging to obtain.  She ultimately had a pelvic ultrasound with GYN which was benign, and was amenable to a CT urogram but deferred cystoscopy.  CT urogram dated 04/24/2023 shows incidental finding of a 3.5 cm exophytic left-sided renal mass, this would be unlikely to be the cause of her gross hematuria.  Using shared decision making, her daughter-in-law Cristie Hem opted for active surveillance for the small renal mass with plan to return in 6 months for a repeat CT.  She was seen in the ER on 06/03/2023 with concern for vaginal bleeding, again was not clear if this was vaginal bleeding or gross hematuria.  Urinalysis at that time showed greater than 50 RBC and greater than 50 WBC, no bacteria, was not sent for culture.  Renal function was normal with creatinine 0.67, EGFR greater than 60, hematocrit 35.4.  She reportedly had significant gross hematuria on a catheterized urinalysis at that time.  History is obtained entirely from her daughter-in-law Cristie Hem again today.  She reports that a few days after being seen in the ER with gross hematuria she was tested again at her facility and urine culture was positive and she was treated with antibiotics, and hematuria resolved.  We again had a very long conversation about possible causes of gross hematuria including infection, fistula, malignancy, less likely related to small 3 cm exophytic left renal mass.  We reviewed limitations of CT for evaluating for bladder lesions or tumors, and that would require cystoscopy for further evaluation of possible cause of hematuria from the bladder or bladder tumor.  Again, using shared decision making, they opted to defer further workup, and they would like to avoid  any procedures or invasive workup/treatment.  We discussed the risk including recurrent bleeding, clot retention, repeat ER visits.  She is also on Xarelto which likely exacerbates any gross hematuria, they are unwilling to stop the Xarelto she has had strokes when holding this temporarily previously.  I think she has a good understanding of the risks and benefits, again we are happy to perform clinic cystoscopy in the future if family desires to evaluate for other causes of gross hematuria, challenging situation with frail elderly patient with severe dementia.  -Keep follow-up as scheduled for surveillance of small left renal mass -Family defers cystoscopy or further workup for gross hematuria   I spent 30 total minutes on the day of the encounter including pre-visit review of the medical record, face-to-face time with the patient, and post visit ordering of labs/imaging/tests.  Sondra Come, MD  [facility] [address] [place], [place] [address] [phone]

## 2023-06-18 DIAGNOSIS — G8929 Other chronic pain: Secondary | ICD-10-CM | POA: Diagnosis not present

## 2023-06-18 DIAGNOSIS — E785 Hyperlipidemia, unspecified: Secondary | ICD-10-CM | POA: Diagnosis not present

## 2023-06-18 DIAGNOSIS — I10 Essential (primary) hypertension: Secondary | ICD-10-CM | POA: Diagnosis not present

## 2023-06-18 DIAGNOSIS — I4891 Unspecified atrial fibrillation: Secondary | ICD-10-CM | POA: Diagnosis not present

## 2023-06-23 DIAGNOSIS — J8410 Pulmonary fibrosis, unspecified: Secondary | ICD-10-CM | POA: Diagnosis not present

## 2023-06-23 DIAGNOSIS — I10 Essential (primary) hypertension: Secondary | ICD-10-CM | POA: Diagnosis not present

## 2023-06-24 DIAGNOSIS — U071 COVID-19: Secondary | ICD-10-CM | POA: Diagnosis not present

## 2023-08-04 DIAGNOSIS — R109 Unspecified abdominal pain: Secondary | ICD-10-CM | POA: Diagnosis not present

## 2023-08-07 DIAGNOSIS — R140 Abdominal distension (gaseous): Secondary | ICD-10-CM | POA: Diagnosis not present

## 2023-08-11 DIAGNOSIS — R109 Unspecified abdominal pain: Secondary | ICD-10-CM | POA: Diagnosis not present

## 2023-08-21 DIAGNOSIS — I10 Essential (primary) hypertension: Secondary | ICD-10-CM | POA: Diagnosis not present

## 2023-08-21 DIAGNOSIS — E785 Hyperlipidemia, unspecified: Secondary | ICD-10-CM | POA: Diagnosis not present

## 2023-08-21 DIAGNOSIS — I4891 Unspecified atrial fibrillation: Secondary | ICD-10-CM | POA: Diagnosis not present

## 2023-10-15 DIAGNOSIS — K5900 Constipation, unspecified: Secondary | ICD-10-CM | POA: Diagnosis not present

## 2023-10-15 DIAGNOSIS — E785 Hyperlipidemia, unspecified: Secondary | ICD-10-CM | POA: Diagnosis not present

## 2023-10-15 DIAGNOSIS — I4891 Unspecified atrial fibrillation: Secondary | ICD-10-CM | POA: Diagnosis not present

## 2023-10-15 DIAGNOSIS — I10 Essential (primary) hypertension: Secondary | ICD-10-CM | POA: Diagnosis not present

## 2023-10-15 DIAGNOSIS — M1990 Unspecified osteoarthritis, unspecified site: Secondary | ICD-10-CM | POA: Diagnosis not present

## 2023-10-15 DIAGNOSIS — F0150 Vascular dementia without behavioral disturbance: Secondary | ICD-10-CM | POA: Diagnosis not present

## 2023-10-15 DIAGNOSIS — R339 Retention of urine, unspecified: Secondary | ICD-10-CM | POA: Diagnosis not present

## 2023-10-15 DIAGNOSIS — I639 Cerebral infarction, unspecified: Secondary | ICD-10-CM | POA: Diagnosis not present

## 2023-11-03 DIAGNOSIS — B351 Tinea unguium: Secondary | ICD-10-CM | POA: Diagnosis not present

## 2023-11-11 ENCOUNTER — Ambulatory Visit: Payer: Medicare HMO | Admitting: Urology

## 2023-11-28 DIAGNOSIS — K529 Noninfective gastroenteritis and colitis, unspecified: Secondary | ICD-10-CM | POA: Diagnosis not present

## 2023-12-11 DIAGNOSIS — E785 Hyperlipidemia, unspecified: Secondary | ICD-10-CM | POA: Diagnosis not present

## 2023-12-11 DIAGNOSIS — I4891 Unspecified atrial fibrillation: Secondary | ICD-10-CM | POA: Diagnosis not present

## 2023-12-11 DIAGNOSIS — I10 Essential (primary) hypertension: Secondary | ICD-10-CM | POA: Diagnosis not present

## 2023-12-23 DIAGNOSIS — M6281 Muscle weakness (generalized): Secondary | ICD-10-CM | POA: Diagnosis not present

## 2023-12-24 DIAGNOSIS — M6281 Muscle weakness (generalized): Secondary | ICD-10-CM | POA: Diagnosis not present

## 2023-12-25 DIAGNOSIS — M6281 Muscle weakness (generalized): Secondary | ICD-10-CM | POA: Diagnosis not present

## 2023-12-26 DIAGNOSIS — M6281 Muscle weakness (generalized): Secondary | ICD-10-CM | POA: Diagnosis not present

## 2023-12-29 DIAGNOSIS — M6281 Muscle weakness (generalized): Secondary | ICD-10-CM | POA: Diagnosis not present

## 2023-12-30 DIAGNOSIS — M6281 Muscle weakness (generalized): Secondary | ICD-10-CM | POA: Diagnosis not present

## 2024-01-01 DIAGNOSIS — M6281 Muscle weakness (generalized): Secondary | ICD-10-CM | POA: Diagnosis not present

## 2024-01-02 DIAGNOSIS — M6281 Muscle weakness (generalized): Secondary | ICD-10-CM | POA: Diagnosis not present

## 2024-01-03 DIAGNOSIS — M6281 Muscle weakness (generalized): Secondary | ICD-10-CM | POA: Diagnosis not present

## 2024-01-04 DIAGNOSIS — M6281 Muscle weakness (generalized): Secondary | ICD-10-CM | POA: Diagnosis not present

## 2024-01-06 DIAGNOSIS — M6281 Muscle weakness (generalized): Secondary | ICD-10-CM | POA: Diagnosis not present

## 2024-01-07 DIAGNOSIS — M6281 Muscle weakness (generalized): Secondary | ICD-10-CM | POA: Diagnosis not present

## 2024-01-08 DIAGNOSIS — M6281 Muscle weakness (generalized): Secondary | ICD-10-CM | POA: Diagnosis not present

## 2024-01-09 DIAGNOSIS — R7401 Elevation of levels of liver transaminase levels: Secondary | ICD-10-CM | POA: Diagnosis not present

## 2024-01-09 DIAGNOSIS — E785 Hyperlipidemia, unspecified: Secondary | ICD-10-CM | POA: Diagnosis not present

## 2024-01-09 DIAGNOSIS — I10 Essential (primary) hypertension: Secondary | ICD-10-CM | POA: Diagnosis not present

## 2024-01-10 DIAGNOSIS — M6281 Muscle weakness (generalized): Secondary | ICD-10-CM | POA: Diagnosis not present

## 2024-01-12 DIAGNOSIS — E785 Hyperlipidemia, unspecified: Secondary | ICD-10-CM | POA: Diagnosis not present

## 2024-01-13 DIAGNOSIS — M6281 Muscle weakness (generalized): Secondary | ICD-10-CM | POA: Diagnosis not present

## 2024-01-14 DIAGNOSIS — M6281 Muscle weakness (generalized): Secondary | ICD-10-CM | POA: Diagnosis not present

## 2024-01-15 DIAGNOSIS — M6281 Muscle weakness (generalized): Secondary | ICD-10-CM | POA: Diagnosis not present

## 2024-01-17 DIAGNOSIS — M6281 Muscle weakness (generalized): Secondary | ICD-10-CM | POA: Diagnosis not present

## 2024-01-19 DIAGNOSIS — M6281 Muscle weakness (generalized): Secondary | ICD-10-CM | POA: Diagnosis not present

## 2024-01-20 DIAGNOSIS — M6281 Muscle weakness (generalized): Secondary | ICD-10-CM | POA: Diagnosis not present

## 2024-01-21 DIAGNOSIS — R4182 Altered mental status, unspecified: Secondary | ICD-10-CM | POA: Diagnosis not present

## 2024-01-21 DIAGNOSIS — N390 Urinary tract infection, site not specified: Secondary | ICD-10-CM | POA: Diagnosis not present

## 2024-01-21 DIAGNOSIS — F0150 Vascular dementia without behavioral disturbance: Secondary | ICD-10-CM | POA: Diagnosis not present

## 2024-01-21 DIAGNOSIS — M6281 Muscle weakness (generalized): Secondary | ICD-10-CM | POA: Diagnosis not present

## 2024-01-22 DIAGNOSIS — M6281 Muscle weakness (generalized): Secondary | ICD-10-CM | POA: Diagnosis not present

## 2024-01-22 DIAGNOSIS — H6123 Impacted cerumen, bilateral: Secondary | ICD-10-CM | POA: Diagnosis not present

## 2024-01-22 DIAGNOSIS — G8929 Other chronic pain: Secondary | ICD-10-CM | POA: Diagnosis not present

## 2024-01-30 ENCOUNTER — Inpatient Hospital Stay
Admission: EM | Admit: 2024-01-30 | Discharge: 2024-02-02 | DRG: 378 | Disposition: A | Discharge status: Skilled Nursing Facility | Source: Skilled Nursing Facility | Attending: Internal Medicine | Admitting: Internal Medicine

## 2024-01-30 ENCOUNTER — Other Ambulatory Visit: Payer: Self-pay

## 2024-01-30 DIAGNOSIS — D62 Acute posthemorrhagic anemia: Secondary | ICD-10-CM | POA: Diagnosis present

## 2024-01-30 DIAGNOSIS — I10 Essential (primary) hypertension: Secondary | ICD-10-CM | POA: Diagnosis not present

## 2024-01-30 DIAGNOSIS — I6930 Unspecified sequelae of cerebral infarction: Secondary | ICD-10-CM | POA: Diagnosis not present

## 2024-01-30 DIAGNOSIS — I480 Paroxysmal atrial fibrillation: Secondary | ICD-10-CM | POA: Diagnosis present

## 2024-01-30 DIAGNOSIS — Z79899 Other long term (current) drug therapy: Secondary | ICD-10-CM

## 2024-01-30 DIAGNOSIS — R0902 Hypoxemia: Secondary | ICD-10-CM | POA: Diagnosis not present

## 2024-01-30 DIAGNOSIS — I4891 Unspecified atrial fibrillation: Secondary | ICD-10-CM | POA: Diagnosis present

## 2024-01-30 DIAGNOSIS — Z888 Allergy status to other drugs, medicaments and biological substances status: Secondary | ICD-10-CM

## 2024-01-30 DIAGNOSIS — Z743 Need for continuous supervision: Secondary | ICD-10-CM | POA: Diagnosis not present

## 2024-01-30 DIAGNOSIS — K625 Hemorrhage of anus and rectum: Secondary | ICD-10-CM | POA: Diagnosis present

## 2024-01-30 DIAGNOSIS — T68XXXA Hypothermia, initial encounter: Secondary | ICD-10-CM | POA: Diagnosis present

## 2024-01-30 DIAGNOSIS — Z993 Dependence on wheelchair: Secondary | ICD-10-CM

## 2024-01-30 DIAGNOSIS — Z87891 Personal history of nicotine dependence: Secondary | ICD-10-CM

## 2024-01-30 DIAGNOSIS — E7800 Pure hypercholesterolemia, unspecified: Secondary | ICD-10-CM | POA: Diagnosis present

## 2024-01-30 DIAGNOSIS — F03911 Unspecified dementia, unspecified severity, with agitation: Secondary | ICD-10-CM | POA: Diagnosis present

## 2024-01-30 DIAGNOSIS — E785 Hyperlipidemia, unspecified: Secondary | ICD-10-CM | POA: Diagnosis present

## 2024-01-30 DIAGNOSIS — K922 Gastrointestinal hemorrhage, unspecified: Secondary | ICD-10-CM | POA: Diagnosis not present

## 2024-01-30 DIAGNOSIS — R001 Bradycardia, unspecified: Secondary | ICD-10-CM | POA: Diagnosis not present

## 2024-01-30 DIAGNOSIS — Z66 Do not resuscitate: Secondary | ICD-10-CM | POA: Diagnosis present

## 2024-01-30 DIAGNOSIS — Z7901 Long term (current) use of anticoagulants: Secondary | ICD-10-CM

## 2024-01-30 DIAGNOSIS — F0390 Unspecified dementia without behavioral disturbance: Secondary | ICD-10-CM | POA: Diagnosis not present

## 2024-01-30 DIAGNOSIS — R404 Transient alteration of awareness: Secondary | ICD-10-CM | POA: Diagnosis not present

## 2024-01-30 DIAGNOSIS — Z885 Allergy status to narcotic agent status: Secondary | ICD-10-CM

## 2024-01-30 DIAGNOSIS — R58 Hemorrhage, not elsewhere classified: Secondary | ICD-10-CM | POA: Diagnosis not present

## 2024-01-30 DIAGNOSIS — Z8249 Family history of ischemic heart disease and other diseases of the circulatory system: Secondary | ICD-10-CM

## 2024-01-30 HISTORY — DX: Unspecified atrial fibrillation: I48.91

## 2024-01-30 HISTORY — DX: Pure hypercholesterolemia, unspecified: E78.00

## 2024-01-30 HISTORY — DX: Malignant neuroleptic syndrome: G21.0

## 2024-01-30 HISTORY — DX: Transient cerebral ischemic attack, unspecified: G45.9

## 2024-01-30 LAB — PROTIME-INR
INR: 1.2 (ref 0.8–1.2)
Prothrombin Time: 15.5 s — ABNORMAL HIGH (ref 11.4–15.2)

## 2024-01-30 LAB — CBC
HCT: 29.3 % — ABNORMAL LOW (ref 36.0–46.0)
Hemoglobin: 9.7 g/dL — ABNORMAL LOW (ref 12.0–15.0)
MCH: 29 pg (ref 26.0–34.0)
MCHC: 33.1 g/dL (ref 30.0–36.0)
MCV: 87.5 fL (ref 80.0–100.0)
Platelets: 359 10*3/uL (ref 150–400)
RBC: 3.35 MIL/uL — ABNORMAL LOW (ref 3.87–5.11)
RDW: 14.7 % (ref 11.5–15.5)
WBC: 6 10*3/uL (ref 4.0–10.5)
nRBC: 0 % (ref 0.0–0.2)

## 2024-01-30 LAB — CBC WITH DIFFERENTIAL/PLATELET
Abs Immature Granulocytes: 0.01 10*3/uL (ref 0.00–0.07)
Basophils Absolute: 0 10*3/uL (ref 0.0–0.1)
Basophils Relative: 1 %
Eosinophils Absolute: 0.4 10*3/uL (ref 0.0–0.5)
Eosinophils Relative: 7 %
HCT: 32.6 % — ABNORMAL LOW (ref 36.0–46.0)
Hemoglobin: 10.5 g/dL — ABNORMAL LOW (ref 12.0–15.0)
Immature Granulocytes: 0 %
Lymphocytes Relative: 34 %
Lymphs Abs: 2.1 10*3/uL (ref 0.7–4.0)
MCH: 28.8 pg (ref 26.0–34.0)
MCHC: 32.2 g/dL (ref 30.0–36.0)
MCV: 89.6 fL (ref 80.0–100.0)
Monocytes Absolute: 0.4 10*3/uL (ref 0.1–1.0)
Monocytes Relative: 6 %
Neutro Abs: 3.2 10*3/uL (ref 1.7–7.7)
Neutrophils Relative %: 52 %
Platelets: 371 10*3/uL (ref 150–400)
RBC: 3.64 MIL/uL — ABNORMAL LOW (ref 3.87–5.11)
RDW: 14.5 % (ref 11.5–15.5)
WBC: 6.2 10*3/uL (ref 4.0–10.5)
nRBC: 0 % (ref 0.0–0.2)

## 2024-01-30 LAB — TYPE AND SCREEN
ABO/RH(D): O POS
Antibody Screen: NEGATIVE

## 2024-01-30 LAB — COMPREHENSIVE METABOLIC PANEL WITH GFR
ALT: 10 U/L (ref 0–44)
AST: 14 U/L — ABNORMAL LOW (ref 15–41)
Albumin: 3.7 g/dL (ref 3.5–5.0)
Alkaline Phosphatase: 81 U/L (ref 38–126)
Anion gap: 7 (ref 5–15)
BUN: 24 mg/dL — ABNORMAL HIGH (ref 8–23)
CO2: 27 mmol/L (ref 22–32)
Calcium: 8.7 mg/dL — ABNORMAL LOW (ref 8.9–10.3)
Chloride: 109 mmol/L (ref 98–111)
Creatinine, Ser: 0.61 mg/dL (ref 0.44–1.00)
GFR, Estimated: >60 mL/min (ref >60)
Glucose, Bld: 93 mg/dL (ref 70–99)
Potassium: 4.4 mmol/L (ref 3.5–5.1)
Sodium: 143 mmol/L (ref 135–145)
Total Bilirubin: 0.5 mg/dL (ref 0.0–1.2)
Total Protein: 7.1 g/dL (ref 6.5–8.1)

## 2024-01-30 LAB — APTT: aPTT: 34 s (ref 24–36)

## 2024-01-30 MED ORDER — SODIUM CHLORIDE 0.9 % IV SOLN: INTRAVENOUS | Status: DC

## 2024-01-30 MED ORDER — PROPRANOLOL HCL 20 MG PO TABS: 10.0000 mg | ORAL_TABLET | Freq: Three times a day (TID) | ORAL | Status: DC

## 2024-01-30 MED ORDER — HYDRALAZINE HCL 20 MG/ML IJ SOLN
5.0000 mg | INTRAMUSCULAR | Status: DC | PRN
  Administered 2024-01-31: 5 mg via INTRAVENOUS
  Filled 2024-01-30: qty 1

## 2024-01-30 MED ORDER — ATORVASTATIN CALCIUM 10 MG PO TABS
10.0000 mg | ORAL_TABLET | Freq: Every day | ORAL | Status: DC
  Administered 2024-02-01 – 2024-02-02 (×2): 10 mg via ORAL
  Filled 2024-01-30 (×3): qty 1

## 2024-01-30 MED ORDER — PROPRANOLOL HCL 10 MG PO TABS
5.0000 mg | ORAL_TABLET | Freq: Three times a day (TID) | ORAL | Status: DC
  Administered 2024-01-31 – 2024-02-02 (×6): 5 mg via ORAL
  Filled 2024-01-30 (×9): qty 0.5

## 2024-01-30 MED ORDER — ACETAMINOPHEN 325 MG PO TABS: 650.0000 mg | ORAL_TABLET | Freq: Four times a day (QID) | ORAL | Status: DC | PRN

## 2024-01-30 MED ORDER — ONDANSETRON HCL 4 MG/2ML IJ SOLN: 4.0000 mg | Freq: Three times a day (TID) | INTRAMUSCULAR | Status: DC | PRN

## 2024-01-30 MED ORDER — TAMSULOSIN HCL 0.4 MG PO CAPS
0.4000 mg | ORAL_CAPSULE | Freq: Every day | ORAL | Status: DC
  Administered 2024-01-31 – 2024-02-02 (×3): 0.4 mg via ORAL
  Filled 2024-01-30 (×3): qty 1

## 2024-01-30 MED ORDER — PANTOPRAZOLE SODIUM 40 MG IV SOLR
40.0000 mg | Freq: Two times a day (BID) | INTRAVENOUS | Status: DC
  Administered 2024-01-30 – 2024-02-01 (×4): 40 mg via INTRAVENOUS
  Filled 2024-01-30 (×4): qty 10

## 2024-01-30 NOTE — ED Notes (Signed)
 Doctor Larinda Buttery in room, undressed patient and blood was found near rectum. Rectal exam performed and confirmed blood in rectum and not vagina.

## 2024-01-30 NOTE — ED Triage Notes (Addendum)
 EMS reports that [facility] called out for vaginal bleeding since this morning, patient has history of dementia.

## 2024-01-30 NOTE — ED Provider Notes (Signed)
 [facility] Provider Note    Event Date/Time   First MD Initiated Contact with Patient 01/30/24 1757     (approximate)   History   Chief Complaint Rectal Bleeding   HPI  [name] is a 83 y.o. female with past medical history of hypertension, atrial fibrillation on Xarelto, stroke, and dementia who presents to the ED complaining of rectal bleeding.  EMS was called by staff at patient's nursing facility, initially reported they were concerned she was bleeding from her vaginal area.  Staff had reported to EMS that they saw blood draining down her leg while she was walking at the facility.  On my assessment, patient is not sure why she is here and currently denies any complaints.     Physical Exam   Triage Vital Signs: ED Triage Vitals  Encounter Vitals Group     BP 01/30/24 1802 (!) 160/74     Systolic BP Percentile --      Diastolic BP Percentile --      Pulse Rate 01/30/24 1759 (!) 57     Resp 01/30/24 1759 20     Temp 01/30/24 1759 (!) 97.4 F (36.3 C)     Temp Source 01/30/24 1759 Oral     SpO2 01/30/24 1759 95 %     Weight --      Height --      Head Circumference --      Peak Flow --      Pain Score 01/30/24 1757 0     Pain Loc --      Pain Education --      Exclude from Growth Chart --     Most recent vital signs: Vitals:   01/30/24 1830 01/30/24 1916  BP: (!) 142/71   Pulse: (!) 57 (!) 56  Resp: 20 20  Temp:    SpO2: 100% 99%    Constitutional: Awake and alert. Eyes: Conjunctivae are normal. Head: Atraumatic. Nose: No congestion/rhinnorhea. Mouth/Throat: Mucous membranes are moist.  Cardiovascular: Normal rate, regular rhythm. Grossly normal heart sounds.  2+ radial pulses bilaterally. Respiratory: Normal respiratory effort.  No retractions. Lungs CTAB. Gastrointestinal: Soft and nontender. No distention.  Rectal exam with gross blood, no bleeding noted from the vaginal area. Musculoskeletal: No lower extremity  tenderness nor edema.  Neurologic:  Normal speech and language. No gross focal neurologic deficits are appreciated.    ED Results / Procedures / Treatments   Labs (all labs ordered are listed, but only abnormal results are displayed) Labs Reviewed  CBC WITH DIFFERENTIAL/PLATELET - Abnormal; Notable for the following components:      Result Value   RBC 3.64 (*)    Hemoglobin 10.5 (*)    HCT 32.6 (*)    All other components within normal limits  COMPREHENSIVE METABOLIC PANEL WITH GFR - Abnormal; Notable for the following components:   BUN 24 (*)    Calcium 8.7 (*)    AST 14 (*)    All other components within normal limits  PROTIME-INR - Abnormal; Notable for the following components:   Prothrombin Time 15.5 (*)    All other components within normal limits  APTT  TYPE AND SCREEN     EKG  ED ECG REPORT I, Chesley Noon, the attending physician, personally viewed and interpreted this ECG.   Date: 01/30/2024  EKG Time: 18:00  Rate: 56  Rhythm: normal sinus rhythm  Axis: Normal  Intervals:right bundle branch block  ST&T Change: None  PROCEDURES:  Critical Care performed: No  Procedures   MEDICATIONS ORDERED IN ED: Medications - No data to display   IMPRESSION / MDM / ASSESSMENT AND PLAN / ED COURSE  I reviewed the triage vital signs and the nursing notes.                              83 y.o. female with past medical history of hypertension, atrial fibrillation on Xarelto, stroke, dementia who presents to the ED for rectal bleeding starting earlier today.  Patient's presentation is most consistent with acute presentation with potential threat to life or bodily function.  Differential diagnosis includes, but is not limited to, upper GI bleed, lower GI bleed, vaginal bleeding, anemia, electrolyte abnormality, AKI.  Patient nontoxic-appearing and in no acute distress, vital signs remarkable for mild bradycardia but otherwise reassuring.  She has a benign  abdominal exam, rectal exam confirms that bleeding is coming from GI source rather than vaginal or urinary.  Labs with drop of hemoglobin by 1 point compared to last year, no significant leukocytosis, electrolyte abnormality, or AKI.  LFTs are unremarkable.  Given patient is anticoagulated, she would benefit from admission for observation.  Case discussed with hospitalist for admission.      FINAL CLINICAL IMPRESSION(S) / ED DIAGNOSES   Final diagnoses:  Lower GI bleed     Rx / DC Orders   ED Discharge Orders     None        Note:  This document was prepared using Dragon voice recognition software and may include unintentional dictation errors.   Chesley Noon, MD 01/30/24 [phone]

## 2024-01-30 NOTE — ED Notes (Signed)
Blankets and pillow given

## 2024-01-30 NOTE — H&P (Signed)
 History and Physical    [name]:[id number] DOB: [birth year] DOA: 01/30/2024  Referring MD/NP/PA:   PCP: [facility]   Patient coming from:  The patient is coming from SNF    Chief Complaint: rectal bleeding  HPI: [name] is a 83 y.o. female with medical history significant of A fib on Xarelto (last dose was 6:30 yesterday 01/29/24), HTN. H LD, stroke (wheelchair bound), dementia (can not use Haldol due to hx of neuroleptic malignant syndrome), who presents with rectal bleeding.   Per report, EMS was called by staff at patient's SNF, initially reported they were concerned she was bleeding from her vaginal area.  Staff had reported to EMS that they saw blood draining down her leg at the facility. Dr. Larinda Buttery of ED did examination, and found that the patient has rectal bleeding instead of vaginal bleeding. Per her son and daughter-in-law at the bedside, patient does not complain abdominal pain, no nausea, vomiting.  Patient does not seem to have chest pain, cough, SOB.  No respiratory distress.  No complaints of UTI.  Her mental status is at her baseline.  Patient is Xarelto, last dose was 6:30 yesterday.   Data reviewed independently and ED Course: pt was found to have hgb 11.6 on 7/39/24 --> 10.5 --> 9.7.  WBC 6.2, INR 1.2, PTT 34, GFR > 60, temperature 97.4, blood pressure 142/71, heart rate of 46- 50s, RR 20, oxygen saturation 95% on room air.  Patient is placed in telemetry bed for observation.  Dr. Norma Fredrickson of GI is consulted.  EKG: I have personally reviewed.  Seem to be A-fib, QTc 487, early R wave progression  Review of Systems: Could not be reviewed accurately due to dementia.  Allergy:  Allergies  Allergen Reactions   Amlodipine Other (See Comments)    Leg swelling   Haldol [Haloperidol Lactate] Other (See Comments)    Neuroleptic Malignant syndrome    Hctz [Hydrochlorothiazide] Nausea Only and Rash    Past Medical History:  Diagnosis Date   Atrial  fibrillation (HCC)    Cat bite of right lower leg [id number]   Cellulitis of lower leg 12/07/2014   High cholesterol    Hypertension    Malignant neuroleptic syndrome    TIA (transient ischemic attack)     Past Surgical History:  Procedure Laterality Date   IR CT HEAD LTD  06/09/2021   IR PERCUTANEOUS ART THROMBECTOMY/INFUSION INTRACRANIAL INC DIAG ANGIO  06/09/2021   RADIOLOGY WITH ANESTHESIA N/A 06/09/2021   Procedure: IR WITH ANESTHESIA;  Surgeon: Radiologist, Medication, MD;  Location: [facility] OR;  Service: Radiology;  Laterality: N/A;    Social History:  reports that she quit smoking about 45 years ago. Her smoking use included cigarettes. She has been exposed to tobacco smoke. She has never used smokeless tobacco. She reports that she does not drink alcohol and does not use drugs.  Family History:  Family History  Problem Relation Age of Onset   Hypertension Mother    Alcohol abuse Father    Heart disease Father    Hypertension Father    Hyperlipidemia Sister    Hypertension Sister    Alcohol abuse Brother    Hypertension Brother      Prior to Admission medications   Medication Sig Start Date End Date Taking? Authorizing Provider  acetaminophen (TYLENOL) 325 MG tablet Take 2 tablets (650 mg total) by mouth every 4 (four) hours as needed for mild pain (or temp > 37.5 C (99.5  F)). 07/04/21   Russella Dar, NP  atorvastatin (LIPITOR) 10 MG tablet Take 10 mg by mouth daily. 04/10/23   [provider]  diclofenac Sodium (VOLTAREN) 1 % GEL Apply 2 g topically 2 (two) times daily. 01/06/23   [provider]  propranolol (INDERAL) 10 MG tablet Take 1 tablet (10 mg total) by mouth 3 (three) times daily. 07/04/21   Russella Dar, NP  rivaroxaban (XARELTO) 20 MG TABS tablet Take 1 tablet (20 mg total) by mouth daily with supper. 07/04/21   Russella Dar, NP  senna-docusate (SENOKOT-S) 8.6-50 MG tablet Take 1 tablet by mouth at bedtime as needed for moderate constipation or  mild constipation. 07/04/21   Russella Dar, NP  tamsulosin (FLOMAX) 0.4 MG CAPS capsule Take 1 capsule (0.4 mg total) by mouth daily after supper. 07/04/21   Russella Dar, NP    Physical Exam: Vitals:   01/30/24 1916 01/30/24 2119 01/30/24 2126 01/30/24 2220  BP:   (!) 134/53 (!) 161/74  Pulse: (!) 56 (!) 55  (!) 46  Resp: 20 18  15   Temp:  98 F (36.7 C)  97.8 F (36.6 C)  TempSrc:    Axillary  SpO2: 99% 98%  100%   General: Not in acute distress HEENT:       Eyes: PERRL, EOMI, no jaundice       ENT: No discharge from the ears and nose.       Neck: No JVD, no bruit, no mass felt. Heme: No neck lymph node enlargement. Cardiac: S1/S2, RRR, No murmurs, No gallops or rubs. Respiratory: No rales, wheezing, rhonchi or rubs. GI: Soft, nondistended, nontender, no organomegaly, BS present. GU: No hematuria Ext: No pitting leg edema bilaterally. 1+DP/PT pulse bilaterally. Musculoskeletal: No joint deformities, No joint redness or warmth, no limitation of ROM in spin. Skin: No rashes.  Neuro: Alert, partially following command, mental status is at baseline (recognizes family members, not oriented to place and time), cranial nerves II-XII grossly intact, moves all extremities. Psych: Patient is not psychotic, no suicidal or hemocidal ideation.  Labs on Admission: I have personally reviewed following labs and imaging studies  CBC: Recent Labs  Lab 01/30/24 1808 01/30/24 2100  WBC 6.2 6.0  NEUTROABS 3.2  --   HGB 10.5* 9.7*  HCT 32.6* 29.3*  MCV 89.6 87.5  PLT 371 359   Basic Metabolic Panel: Recent Labs  Lab 01/30/24 1808  NA 143  K 4.4  CL 109  CO2 27  GLUCOSE 93  BUN 24*  CREATININE 0.61  CALCIUM 8.7*   GFR: CrCl cannot be calculated (Unknown ideal weight.). Liver Function Tests: Recent Labs  Lab 01/30/24 1808  AST 14*  ALT 10  ALKPHOS 81  BILITOT 0.5  PROT 7.1  ALBUMIN 3.7   No results for input(s): "LIPASE", "AMYLASE" in the last 168 hours. No  results for input(s): "AMMONIA" in the last 168 hours. Coagulation Profile: Recent Labs  Lab 01/30/24 1808  INR 1.2   Cardiac Enzymes: No results for input(s): "CKTOTAL", "CKMB", "CKMBINDEX", "TROPONINI" in the last 168 hours. BNP (last 3 results) No results for input(s): "PROBNP" in the last 8760 hours. HbA1C: No results for input(s): "HGBA1C" in the last 72 hours. CBG: No results for input(s): "GLUCAP" in the last 168 hours. Lipid Profile: No results for input(s): "CHOL", "HDL", "LDLCALC", "TRIG", "CHOLHDL", "LDLDIRECT" in the last 72 hours. Thyroid Function Tests: No results for input(s): "TSH", "T4TOTAL", "FREET4", "T3FREE", "THYROIDAB" in the last  72 hours. Anemia Panel: No results for input(s): "VITAMINB12", "FOLATE", "FERRITIN", "TIBC", "IRON", "RETICCTPCT" in the last 72 hours. Urine analysis:    Component Value Date/Time   COLORURINE RED (A) 06/03/2023 0601   APPEARANCEUR CLOUDY (A) 06/03/2023 0601   APPEARANCEUR Clear 06/06/2021 1130   LABSPEC  06/03/2023 0601    TEST NOT REPORTED DUE TO COLOR INTERFERENCE OF URINE PIGMENT   PHURINE  06/03/2023 0601    TEST NOT REPORTED DUE TO COLOR INTERFERENCE OF URINE PIGMENT   GLUCOSEU (A) 06/03/2023 0601    TEST NOT REPORTED DUE TO COLOR INTERFERENCE OF URINE PIGMENT   HGBUR (A) 06/03/2023 0601    TEST NOT REPORTED DUE TO COLOR INTERFERENCE OF URINE PIGMENT   BILIRUBINUR (A) 06/03/2023 0601    TEST NOT REPORTED DUE TO COLOR INTERFERENCE OF URINE PIGMENT   BILIRUBINUR Negative 06/06/2021 1130   KETONESUR (A) 06/03/2023 0601    TEST NOT REPORTED DUE TO COLOR INTERFERENCE OF URINE PIGMENT   PROTEINUR (A) 06/03/2023 0601    TEST NOT REPORTED DUE TO COLOR INTERFERENCE OF URINE PIGMENT   NITRITE (A) 06/03/2023 0601    TEST NOT REPORTED DUE TO COLOR INTERFERENCE OF URINE PIGMENT   LEUKOCYTESUR (A) 06/03/2023 0601    TEST NOT REPORTED DUE TO COLOR INTERFERENCE OF URINE PIGMENT   Sepsis  Labs: @LABRCNTIP (procalcitonin:4,lacticidven:4) )No results found for this or any previous visit (from the past 240 hours).   Radiological Exams on Admission:   Assessment/Plan Principal Problem:   Rectal bleeding Active Problems:   Acute blood loss anemia   Hypothermia   Atrial fibrillation (HCC)   HTN (hypertension)   HLD (hyperlipidemia)   History of CVA with residual deficit   Dementia without behavioral disturbance (HCC)   Assessment and Plan:   Rectal bleeding and acute blood loss anemia: Hgb 11.6 on 7/39/24 --> 10.5 --> 9.7. consulted Dr. Norma Fredrickson of GI.  - will place in tele bed for obs --Hold Xarelto - IVF: NS at 75 mL/hr - Start IV pantoprazole 40 mg bid - Zofran IV for nausea - Avoid NSAIDs and SQ heparin - Maintain IV access (2 large bore IVs if possible). - Monitor closely and follow q6h cbc, transfuse as necessary, if Hgb<7.0 - LaB: INR, PTT and type screen  Hypothermia -Bair hugger  Atrial fibrillation (HCC): HR 46-50s -Decrease propranolol dose from 10 to 5 mg 3 times daily, hold tonight's dose, restart tomorrow morning -Hold Xarelto  HTN (hypertension) -Propranolol -IV hydralazine as needed  HLD (hyperlipidemia) -Lipitor  History of CVA with residual deficit -Lipitor  Dementia without behavioral disturbance [facility]): Patient is, currently. Patient cannot tolerate Haldol due to history of malignant neuroleptic syndrome. -Fall precaution     DVT ppx: SCD  Code Status: DNR per her son  Family Communication:    Yes, patient's son and daughter-in-law   at bed side.        Disposition Plan:  Anticipate discharge back to previous environment, SNF  Consults called:  Dr. Norma Fredrickson of GI is consulted  Admission status and Level of care: Telemetry Medical:    for obs     Dispo: The patient is from: SNF              Anticipated d/c is to: SNF              Anticipated d/c date is: 1 day              Patient currently is not medically stable to  d/c.  Severity of Illness:  The appropriate patient status for this patient is OBSERVATION. Observation status is judged to be reasonable and necessary in order to provide the required intensity of service to ensure the patient's safety. The patient's presenting symptoms, physical exam findings, and initial radiographic and laboratory data in the context of their medical condition is felt to place them at decreased risk for further clinical deterioration. Furthermore, it is anticipated that the patient will be medically stable for discharge from the hospital within 2 midnights of admission.          Date of Service 01/30/2024    Lorretta Harp [facility]   If 7PM-7AM, please contact night-coverage [email or web address] 01/30/2024, 11:25 PM

## 2024-01-31 DIAGNOSIS — Z7401 Bed confinement status: Secondary | ICD-10-CM | POA: Diagnosis not present

## 2024-01-31 DIAGNOSIS — I480 Paroxysmal atrial fibrillation: Secondary | ICD-10-CM | POA: Diagnosis not present

## 2024-01-31 DIAGNOSIS — Z993 Dependence on wheelchair: Secondary | ICD-10-CM | POA: Diagnosis not present

## 2024-01-31 DIAGNOSIS — R4182 Altered mental status, unspecified: Secondary | ICD-10-CM | POA: Diagnosis not present

## 2024-01-31 DIAGNOSIS — Z79899 Other long term (current) drug therapy: Secondary | ICD-10-CM | POA: Diagnosis not present

## 2024-01-31 DIAGNOSIS — K625 Hemorrhage of anus and rectum: Secondary | ICD-10-CM | POA: Diagnosis not present

## 2024-01-31 DIAGNOSIS — Z743 Need for continuous supervision: Secondary | ICD-10-CM | POA: Diagnosis not present

## 2024-01-31 DIAGNOSIS — I6930 Unspecified sequelae of cerebral infarction: Secondary | ICD-10-CM | POA: Diagnosis not present

## 2024-01-31 DIAGNOSIS — Z66 Do not resuscitate: Secondary | ICD-10-CM | POA: Diagnosis not present

## 2024-01-31 DIAGNOSIS — Z888 Allergy status to other drugs, medicaments and biological substances status: Secondary | ICD-10-CM | POA: Diagnosis not present

## 2024-01-31 DIAGNOSIS — E7800 Pure hypercholesterolemia, unspecified: Secondary | ICD-10-CM | POA: Diagnosis not present

## 2024-01-31 DIAGNOSIS — Z7901 Long term (current) use of anticoagulants: Secondary | ICD-10-CM | POA: Diagnosis not present

## 2024-01-31 DIAGNOSIS — Z885 Allergy status to narcotic agent status: Secondary | ICD-10-CM | POA: Diagnosis not present

## 2024-01-31 DIAGNOSIS — K922 Gastrointestinal hemorrhage, unspecified: Secondary | ICD-10-CM | POA: Diagnosis not present

## 2024-01-31 DIAGNOSIS — Z87891 Personal history of nicotine dependence: Secondary | ICD-10-CM | POA: Diagnosis not present

## 2024-01-31 DIAGNOSIS — Z8249 Family history of ischemic heart disease and other diseases of the circulatory system: Secondary | ICD-10-CM | POA: Diagnosis not present

## 2024-01-31 DIAGNOSIS — I10 Essential (primary) hypertension: Secondary | ICD-10-CM | POA: Diagnosis not present

## 2024-01-31 DIAGNOSIS — D62 Acute posthemorrhagic anemia: Secondary | ICD-10-CM | POA: Diagnosis not present

## 2024-01-31 DIAGNOSIS — F03911 Unspecified dementia, unspecified severity, with agitation: Secondary | ICD-10-CM | POA: Diagnosis not present

## 2024-01-31 LAB — BASIC METABOLIC PANEL WITH GFR
Anion gap: 8 (ref 5–15)
BUN: 18 mg/dL (ref 8–23)
CO2: 24 mmol/L (ref 22–32)
Calcium: 8.7 mg/dL — ABNORMAL LOW (ref 8.9–10.3)
Chloride: 107 mmol/L (ref 98–111)
Creatinine, Ser: 0.66 mg/dL (ref 0.44–1.00)
GFR, Estimated: >60 mL/min (ref >60)
Glucose, Bld: 82 mg/dL (ref 70–99)
Potassium: 3.6 mmol/L (ref 3.5–5.1)
Sodium: 139 mmol/L (ref 135–145)

## 2024-01-31 LAB — CBC
HCT: 29.1 % — ABNORMAL LOW (ref 36.0–46.0)
HCT: 33.3 % — ABNORMAL LOW (ref 36.0–46.0)
HCT: 37.6 % (ref 36.0–46.0)
Hemoglobin: 9.5 g/dL — ABNORMAL LOW (ref 12.0–15.0)
Hemoglobin: 11 g/dL — ABNORMAL LOW (ref 12.0–15.0)
Hemoglobin: 12.2 g/dL (ref 12.0–15.0)
MCH: 28.6 pg (ref 26.0–34.0)
MCH: 28.8 pg (ref 26.0–34.0)
MCH: 28.6 pg (ref 26.0–34.0)
MCHC: 32.6 g/dL (ref 30.0–36.0)
MCHC: 33 g/dL (ref 30.0–36.0)
MCHC: 32.4 g/dL (ref 30.0–36.0)
MCV: 87.7 fL (ref 80.0–100.0)
MCV: 87.2 fL (ref 80.0–100.0)
MCV: 88.1 fL (ref 80.0–100.0)
Platelets: 323 10*3/uL (ref 150–400)
Platelets: 347 10*3/uL (ref 150–400)
Platelets: 366 10*3/uL (ref 150–400)
RBC: 3.32 MIL/uL — ABNORMAL LOW (ref 3.87–5.11)
RBC: 3.82 MIL/uL — ABNORMAL LOW (ref 3.87–5.11)
RBC: 4.27 MIL/uL (ref 3.87–5.11)
RDW: 14.5 % (ref 11.5–15.5)
RDW: 14.6 % (ref 11.5–15.5)
RDW: 14.5 % (ref 11.5–15.5)
WBC: 5.6 10*3/uL (ref 4.0–10.5)
WBC: 5.4 10*3/uL (ref 4.0–10.5)
WBC: 5.7 10*3/uL (ref 4.0–10.5)
nRBC: 0 % (ref 0.0–0.2)
nRBC: 0 % (ref 0.0–0.2)
nRBC: 0 % (ref 0.0–0.2)

## 2024-01-31 MED ORDER — OLANZAPINE 10 MG IM SOLR: 2.5000 mg | Freq: Four times a day (QID) | INTRAMUSCULAR | Status: DC | PRN

## 2024-01-31 MED ORDER — LORAZEPAM 2 MG/ML IJ SOLN
1.0000 mg | Freq: Once | INTRAMUSCULAR | Status: AC
  Administered 2024-01-31: 1 mg via INTRAVENOUS
  Filled 2024-01-31: qty 1

## 2024-01-31 MED ORDER — DICLOFENAC SODIUM 1 % EX GEL
2.0000 g | Freq: Two times a day (BID) | CUTANEOUS | Status: DC
  Administered 2024-01-31 – 2024-02-02 (×2): 2 g via TOPICAL
  Filled 2024-01-31: qty 100

## 2024-01-31 MED ORDER — HALOPERIDOL 2 MG PO TABS
2.0000 mg | ORAL_TABLET | Freq: Four times a day (QID) | ORAL | Status: DC | PRN
  Filled 2024-01-31: qty 1

## 2024-01-31 MED ORDER — ZINC OXIDE 20 % EX OINT
1.0000 | TOPICAL_OINTMENT | Freq: Three times a day (TID) | CUTANEOUS | Status: DC
  Administered 2024-01-31 – 2024-02-02 (×7): 1 via TOPICAL
  Filled 2024-01-31: qty 1

## 2024-01-31 MED ORDER — HYDRALAZINE HCL 20 MG/ML IJ SOLN
10.0000 mg | INTRAMUSCULAR | Status: DC | PRN
  Administered 2024-01-31: 10 mg via INTRAVENOUS
  Filled 2024-01-31: qty 1

## 2024-01-31 MED ORDER — LORAZEPAM 2 MG/ML IJ SOLN
1.0000 mg | INTRAMUSCULAR | Status: DC | PRN
  Administered 2024-01-31: 2 mg via INTRAVENOUS
  Filled 2024-01-31: qty 1

## 2024-01-31 MED ORDER — ACETAMINOPHEN 325 MG PO TABS: 650.0000 mg | ORAL_TABLET | ORAL | Status: DC | PRN

## 2024-01-31 MED ORDER — SENNOSIDES-DOCUSATE SODIUM 8.6-50 MG PO TABS: 1.0000 | ORAL_TABLET | Freq: Every evening | ORAL | Status: DC | PRN

## 2024-01-31 MED ORDER — HALOPERIDOL LACTATE 5 MG/ML IJ SOLN: 2.0000 mg | Freq: Four times a day (QID) | INTRAMUSCULAR | Status: DC | PRN

## 2024-01-31 NOTE — ED Notes (Signed)
 Pt still fidgeting with mitts and trying to pull off cardiac leads.

## 2024-01-31 NOTE — ED Notes (Signed)
 Pt attempting to pull at lines and remove soft mitts (after ativan).

## 2024-01-31 NOTE — ED Notes (Signed)
 Informed provider that pt again ripped out IV. See new order.

## 2024-01-31 NOTE — ED Notes (Signed)
 Pt had removed purewick (with mitts on) and whole bed was wet. Purewick replaced, bed changed.

## 2024-01-31 NOTE — ED Notes (Signed)
 Soft mitt placed on pt. Could only find 1 mitt at this time. Lab at bedside to recollect lt green tube.activity apron placed on pt.

## 2024-01-31 NOTE — ED Notes (Signed)
 Steward Drone, NP (Hospitalist) was made aware of heart rate between 37-56, sleeping and asymptomatic. Dr. Clyde Lundborg was aware of the bradycardia and is holding the beta blocker for that reason.

## 2024-01-31 NOTE — ED Notes (Signed)
 Pt picked cardiac leads off again.

## 2024-01-31 NOTE — ED Notes (Signed)
 Spoke w family member [name] and gave update. [name] states they would be fine with pt being discharged home to facility once ok.

## 2024-01-31 NOTE — ED Notes (Signed)
 Pt continues to pick at mitts and lines. Pt is alert.

## 2024-01-31 NOTE — Progress Notes (Addendum)
 Received second call from nursing asking for something to address the patient's agitation in the ED. She was given a one time dose of ativan 1 mg earlier this morning (7:30 am). She is now pulling out IV's and is agitated again. I initially wrote for zyprexa, but then saw that the patient has had previous neuroleptic malignant syndrome. I discussed the patient with the pharmacist. She recommended haldol, stating that previous NMS is not an absolute contraindication, and that haldol was more safe than zyprexa. I have written for haldol 2 mg IV or PO q 6. The patient will be monitored carefully for hypertension, tachycardia, or fevers. The haldol will be stopped immediately if any signs of NMS are seen. I advised nursing of the situation. They have gotten back to me and told me that this level of monitoring of the patient will be difficult in the ED, and asked if there were something else we could use of agitation. I have now discontinued the haldol and written for more frequent and higher dosed ativan.

## 2024-01-31 NOTE — Consult Note (Signed)
GI Inpatient Consult Note  Reason for Consult: Rectal bleeding    Attending Requesting Consult: Dr. Lorretta Harp, MD  History of Present Illness: [name] is a 83 y.o. female seen for evaluation of rectal bleeding at the request of admitting hospitalist - Dr. Lorretta Harp. Patient has a PMH of HTN, HLD, hx of CVA, dementia, PAF on chronic anticoagulation, and hx of malignant neuroleptic syndrome. She presented to the [facility] ED via EMS from SNF for chief complaint of rectal bleeding yesterday evening. Per chart review, EMS was called by staff at [facility] due to bleeding after staff saw blood draining down her leg at the facility. Evaluation in the ED by ED physician confirmed gross blood in rectal vault and no vaginal bleeding. Labs significant for hemoglobin 10.5 (11.6 eight months ago) which decreased to 9.7 yesterday evening. Repeat hemoglobin this morning 11.0. Last dose of Xarelto was reportedly around 6:30 PM on 3/27. Patient has been agitated in the ED and pulling out her IV. Patient unable to contribute anything to the history given baseline history of dementia. No obvious distress. No family in room at time of my encounter this morning. Patient resting in ED stretcher. No previous colonoscopy on file. No previous hx of GI bleeding.    Past Medical History:  Past Medical History:  Diagnosis Date   Atrial fibrillation (HCC)    Cat bite of right lower leg [id number]   Cellulitis of lower leg 12/07/2014   High cholesterol    Hypertension    Malignant neuroleptic syndrome    TIA (transient ischemic attack)     Problem List: Patient Active Problem List   Diagnosis Date Noted   Rectal bleeding 01/30/2024   Acute blood loss anemia 01/30/2024   Hypothermia 01/30/2024   HLD (hyperlipidemia) 01/30/2024   Ambulatory dysfunction    Agitation due to dementia (HCC) 06/11/2021   Stroke (HCC) 06/09/2021   Acute ischemic left MCA stroke (HCC) 06/09/2021   Middle cerebral artery embolism,  left 06/09/2021   Acute ischemic stroke (HCC)    Memory loss 05/28/2021   Dementia without behavioral disturbance (HCC) 05/28/2021   Neuroleptic malignant syndrome 04/01/2021   History of CVA with residual deficit 03/30/2021   Acute confusion 03/29/2021   Delirium 03/29/2021   Cellulitis 03/29/2021   Chronic venous insufficiency 10/14/2016   Atrial fibrillation (HCC) 06/15/2015   Rash 06/15/2015   HTN (hypertension) 04/11/2015   Osteoarthritis 04/11/2015    Past Surgical History: Past Surgical History:  Procedure Laterality Date   IR CT HEAD LTD  06/09/2021   IR PERCUTANEOUS ART THROMBECTOMY/INFUSION INTRACRANIAL INC DIAG ANGIO  06/09/2021   RADIOLOGY WITH ANESTHESIA N/A 06/09/2021   Procedure: IR WITH ANESTHESIA;  Surgeon: Radiologist, Medication, MD;  Location: [facility] OR;  Service: Radiology;  Laterality: N/A;    Allergies: Allergies  Allergen Reactions   Amlodipine Other (See Comments)    Leg swelling   Haldol [Haloperidol Lactate] Other (See Comments)    Neuroleptic Malignant syndrome    Hctz [Hydrochlorothiazide] Nausea Only and Rash    Home Medications: (Not in a hospital admission)  Home medication reconciliation was completed with the patient.   Scheduled Inpatient Medications:    atorvastatin  10 mg Oral Daily   pantoprazole (PROTONIX) IV  40 mg Intravenous Q12H   propranolol  5 mg Oral TID   tamsulosin  0.4 mg Oral QPC supper    Continuous Inpatient Infusions:    sodium chloride Stopped (01/31/24 0921)  PRN Inpatient Medications:  acetaminophen, hydrALAZINE, ondansetron (ZOFRAN) IV  Family History: family history includes Alcohol abuse in her brother and father; Heart disease in her father; Hyperlipidemia in her sister; Hypertension in her brother, father, mother, and sister.  The patient's family history is negative for inflammatory bowel disorders, GI malignancy, or solid organ transplantation.  Social History:   reports that she quit smoking about 45  years ago. Her smoking use included cigarettes. She has been exposed to tobacco smoke. She has never used smokeless tobacco. She reports that she does not drink alcohol and does not use drugs. The patient denies ETOH, tobacco, or drug use.   Review of Systems:  Unable to obtain 2/2 baseline dementia    Physical Examination: BP 123/82   Pulse 64   Temp 98.2 F (36.8 C) (Axillary)   Resp (!) 25   LMP  (LMP Unknown)   SpO2 96%  Gen: NAD, pleasantly confused HEENT: PEERLA, EOMI, Neck: supple, no JVD or thyromegaly Chest: CTA bilaterally, no wheezes, crackles, or other adventitious sounds CV: RRR, no m/g/c/r Abd: soft, NT, ND, +BS in all four quadrants; no HSM, guarding, ridigity, or rebound tenderness Ext: no edema, well perfused with 2+ pulses, Skin: no rash or lesions noted Lymph: no LAD  Data: Lab Results  Component Value Date   WBC 5.4 01/31/2024   HGB 11.0 (L) 01/31/2024   HCT 33.3 (L) 01/31/2024   MCV 87.2 01/31/2024   PLT 347 01/31/2024   Recent Labs  Lab 01/30/24 2100 01/31/24 0325 01/31/24 0801  HGB 9.7* 9.5* 11.0*   Lab Results  Component Value Date   NA 139 01/31/2024   K 3.6 01/31/2024   CL 107 01/31/2024   CO2 24 01/31/2024   BUN 18 01/31/2024   CREATININE 0.66 01/31/2024   Lab Results  Component Value Date   ALT 10 01/30/2024   AST 14 (L) 01/30/2024   ALKPHOS 81 01/30/2024   BILITOT 0.5 01/30/2024   Recent Labs  Lab 01/30/24 1808  APTT 34  INR 1.2    Assessment/Plan:  83 y/o Caucasian female with a PMH of HTN, HLD, hx of CVA, dementia, PAF on chronic anticoagulation, and hx of malignant neuroleptic syndrome presented to the [facility] ED 3/28 from SNF for chief complaint of rectal bleeding.  Rectal bleeding - hemodynamically stable currently with no overt GI bleeding, hemoglobin 11.0 this AM. DDx includes anal outlet etiology from internal hemorrhoids, colorectal neoplasm, colitis, AVMs, self limited diverticular bleed, ulcer, etc  Normocytic  anemia - trough 9.5, hemoglobin 11.0 this AM  PAF on chronic anticoagulation - held  Hx of CVA with residual deficit   Dementia   Recommendations:  - H&H stable with no overt GI bleeding - Continue to monitor for signs of bleeding - Supportive care per primary team - Continue to hold anticoagulation today. Can likely re-start tomorrow AM and monitor closely for hemodynamic changes and bleeding.  - Avoid NSAIDs - Transfuse as necessary for hemoglobin <7.0.  - Called and spoke with patient's daughter-in-law Cristie Hem) about plan of care. We reviewed potential invasive procedures such as colonoscopy. Family is not wanting to pursue invasive evaluation at this time which is reasonable given her dementia and hx of CVA. - If signs of acute drop in hemoglobin or hemodynamic instability, will reconsider - ADAT - GI will sign off at this time and follow peripherally   Thank you for the consult. Please call with questions or concerns.  Gilda Crease, PA-C [facility]  [phone]  

## 2024-01-31 NOTE — ED Notes (Signed)
 Pt still picking at lines. Purewick placed.

## 2024-01-31 NOTE — ED Notes (Signed)
 Pt continues to pull at soft mitts.

## 2024-01-31 NOTE — ED Notes (Signed)
 First encounter with pt, pt has picked off most of the EKG leads and also has pulled out IV. Pt has  clear speech, very confused.

## 2024-01-31 NOTE — Progress Notes (Signed)
 Progress Note   Patient: [name] ZOX:[id number] DOB: 1940/11/07 DOA: 01/30/2024     0 DOS: the patient was seen and examined on 01/31/2024   Brief hospital course: The patient is a 83 yr old woman who presents from [facility] via EMS due to staff concerns that she was having vaginal bleeding.   The patient has a significant medical history significant for f HTN, HLD, hx of CVA, dementia, PAF on chronic anticoagulation, and hx of malignant neuroleptic syndrome.   The patient was seen with blood dripping down her leg. Upon examination by EDP it was found that the patient was having rectal bleeding. Her hemoglobin upon arrival was 10.5 It has remained stable at 12.2 on the afternoon of 01/31/2024. GI was consulted. They have discussed the patient with the family. The family does not desire any invasive procedures. DDx per GI included internal hemorrhoids, colorectal neoplasm, colitis, AVM's, self limited diverticular bleed, ulcer, etc. The patient is on chronic anticoagulation for stroke prophylaxis due to atrial fibrillation.   GI recommends continuing to monitor the patient for signs of bleeding. Continue to hold anticoagulation until the morning of 02/01/2024. Avoid NSAIDS, They will reconsider invasive procedure such as colonoscopy should the patient experience an acute drop in hemoglobin. Her diet will be advanced as tolerated. GI has signed off.  While awaiting a bed upstairs the patient became acutely and severely agitated in the ED. She was given a one time dose of ativan 1 mg. While effective, this was short-lived. The patient has had a previous neuroleptic malignant syndrome reaction to antipsychotics. When the patient again became severely agitated. She will receive Ativan 1-2 mg IV q 4 hours prn agitation.   Assessment and Plan:  Rectal bleeding and acute blood loss anemia: Hgb 11.6 on 7/39/24 --> 10.5 --> 9.7. consulted Dr. Norma Fredrickson of GI.   - will place in tele bed. She is  inpatient. --Hold Xarelto --Her diet has been advanced to a clear liquid diet. Will advance as tolerated as per GI recommendations. - Speech eval. - Change IV pantoprazole to oral as possible. - Zofran IV for nausea - Avoid NSAIDs and SQ heparin - Maintain IV access (2 large bore IVs if possible). - Monitor closely and follow q6h cbc, transfuse as necessary, if Hgb<7.0 - LaB: INR, PTT and type screen --Stop IV fluids when patient is able to take PO.   Hypothermia -Resolved.    Atrial fibrillation (HCC): HR 55-83s -Propranolol dose was reduced from 10 to 5 mg 3 times daily. -Hold Xarelto until tomorrow am.   HTN (hypertension) -The patient is normotensive on propranolol and hydralazine.   HLD (hyperlipidemia) -Lipitor   History of CVA with residual deficit -Lipitor   Dementia without behavioral disturbance [facility]): Patient is, currently. Patient cannot tolerate Haldol due to history of malignant neuroleptic syndrome. --Use Ativan for agitation. -Fall precaution   I have seen and examined this patient myself. I have spent 34 minutes in her evaluation and care.     DVT ppx: SCD   Code Status: DNR per her son   Family Communication:    Yes, patient's son and daughter-in-law   at bed side.         Disposition Plan:  Anticipate discharge back to previous environment, SNF   Consults called:  Dr. Norma Fredrickson of GI is consulted      Subjective: The patient is somnolent following having been medicated with ativan.   Physical Exam: Vitals:   01/31/24 1130 01/31/24  1143 01/31/24 1200 01/31/24 1430  BP:   125/77   Pulse: 81  83 70  Resp:   20   Temp:  97.9 F (36.6 C)    TempSrc:  Axillary    SpO2: 100%  99% 98%   Exam:  Constitutional:  The patient is awake, alert, and oriented x 3. No acute distress. Respiratory:  No increased work of breathing. No wheezes, rales, or rhonchi No tactile fremitus Cardiovascular:  Regular rate and rhythm No murmurs, ectopy, or  gallups. No lateral PMI. No thrills. Abdomen:  Abdomen is soft, non-tender, non-distended No hernias, masses, or organomegaly Normoactive bowel sounds.  Musculoskeletal:  No cyanosis, clubbing, or edema Skin:  No rashes, lesions, ulcers palpation of skin: no induration or nodules Neurologic:  Unable to evaluate as the patient is unable to cooperate with exam. Psychiatric:  Unable to evaluate as the patient is unable to cooperate with exam.  Family Communication: None available  Disposition: Status is: Inpatient Remains inpatient appropriate because: Need for close monitoring of hemoglobin and vitals. Need for management of her severe agitation with IV benzodiazepines.  Planned Discharge Destination: Skilled nursing facility    Time spent: 38 minutes  Author: Arlanda Shiplett, DO 01/31/2024 5:15 PM  For on call review [email or web address].

## 2024-02-01 DIAGNOSIS — K625 Hemorrhage of anus and rectum: Secondary | ICD-10-CM | POA: Diagnosis not present

## 2024-02-01 LAB — BASIC METABOLIC PANEL WITH GFR
Anion gap: 10 (ref 5–15)
BUN: 13 mg/dL (ref 8–23)
CO2: 21 mmol/L — ABNORMAL LOW (ref 22–32)
Calcium: 8.9 mg/dL (ref 8.9–10.3)
Chloride: 107 mmol/L (ref 98–111)
Creatinine, Ser: 0.59 mg/dL (ref 0.44–1.00)
GFR, Estimated: >60 mL/min (ref >60)
Glucose, Bld: 78 mg/dL (ref 70–99)
Potassium: 3.9 mmol/L (ref 3.5–5.1)
Sodium: 138 mmol/L (ref 135–145)

## 2024-02-01 LAB — CBC
HCT: 39.3 % (ref 36.0–46.0)
Hemoglobin: 12.7 g/dL (ref 12.0–15.0)
MCH: 28.4 pg (ref 26.0–34.0)
MCHC: 32.3 g/dL (ref 30.0–36.0)
MCV: 87.9 fL (ref 80.0–100.0)
Platelets: 358 10*3/uL (ref 150–400)
RBC: 4.47 MIL/uL (ref 3.87–5.11)
RDW: 14.3 % (ref 11.5–15.5)
WBC: 4.9 10*3/uL (ref 4.0–10.5)
nRBC: 0 % (ref 0.0–0.2)

## 2024-02-01 MED ORDER — RIVAROXABAN 20 MG PO TABS
20.0000 mg | ORAL_TABLET | Freq: Every day | ORAL | Status: DC
  Administered 2024-02-01 – 2024-02-02 (×2): 20 mg via ORAL
  Filled 2024-02-01 (×2): qty 1

## 2024-02-01 MED ORDER — PANTOPRAZOLE SODIUM 40 MG PO TBEC
40.0000 mg | DELAYED_RELEASE_TABLET | Freq: Every day | ORAL | Status: DC
  Administered 2024-02-02: 40 mg via ORAL
  Filled 2024-02-01: qty 1

## 2024-02-01 NOTE — ED Notes (Signed)
 Pt takes her meds crush in applesauce per family member.

## 2024-02-01 NOTE — NC FL2 (Signed)
 [place] MEDICAID FL2 LEVEL OF CARE FORM     IDENTIFICATION  Patient Name: Denice Cardon Star Birthdate: 27-Sep-1941 Sex: female Admission Date (Current Location): 01/30/2024  [facility] and [place] Number:  Chiropodist and Address:  [address] [facility] [address], [address], [place], [place] [address]      Provider Number: [id number]  Attending Physician Name and Address:  Fran Lowes, DO  Relative Name and Phone Number:  Cabral,[name] (Other)  [phone] [facility] Phone)    Current Level of Care: Hospital Recommended Level of Care: Skilled Nursing Facility Prior Approval Number:    Date Approved/Denied:   PASRR Number: [id number]  Discharge Plan:      Current Diagnoses: Patient Active Problem List   Diagnosis Date Noted   Bright red blood per rectum 01/31/2024   Rectal bleeding 01/30/2024   Acute blood loss anemia 01/30/2024   Hypothermia 01/30/2024   HLD (hyperlipidemia) 01/30/2024   Ambulatory dysfunction    Agitation due to dementia (HCC) 06/11/2021   Stroke (HCC) 06/09/2021   Acute ischemic left MCA stroke (HCC) 06/09/2021   Middle cerebral artery embolism, left 06/09/2021   Acute ischemic stroke [facility])    Memory loss 05/28/2021   Dementia without behavioral disturbance (HCC) 05/28/2021   Neuroleptic malignant syndrome 04/01/2021   History of CVA with residual deficit 03/30/2021   Acute confusion 03/29/2021   Delirium 03/29/2021   Cellulitis 03/29/2021   Chronic venous insufficiency 10/14/2016   Atrial fibrillation (HCC) 06/15/2015   Rash 06/15/2015   HTN (hypertension) 04/11/2015   Osteoarthritis 04/11/2015    Orientation RESPIRATION BLADDER Height & Weight     Self  Normal External catheter Weight:   Height:     BEHAVIORAL SYMPTOMS/MOOD NEUROLOGICAL BOWEL NUTRITION STATUS        Diet  AMBULATORY STATUS COMMUNICATION OF NEEDS Skin   Limited Assist Verbally Skin abrasions                       Personal Care Assistance Level of  Assistance  Bathing, Feeding, Dressing Bathing Assistance: Limited assistance Feeding assistance: Limited assistance Dressing Assistance: Limited assistance     Functional Limitations Info             SPECIAL CARE FACTORS FREQUENCY                       Contractures      Additional Factors Info  Code Status, Allergies Code Status Info: Limited: Do not attempt resuscitation (DNR) -DNR-LIMITED -Do Not Intubate/DNI Allergies Info: Amlodipine, Haldol (Haloperidol Lactate), Hctz (Hydrochlorothiazide)           Current Medications (02/01/2024):  This is the current hospital active medication list Current Facility-Administered Medications  Medication Dose Route Frequency Provider Last Rate Last Admin   0.9 %  sodium chloride infusion   Intravenous Continuous Lorretta Harp, MD 75 mL/hr at 01/31/24 1433 Rate Verify at 01/31/24 1433   acetaminophen (TYLENOL) tablet 650 mg  650 mg Oral Q4H PRN Swayze, Ava, DO       atorvastatin (LIPITOR) tablet 10 mg  10 mg Oral Daily Lorretta Harp, MD   10 mg at 02/01/24 0841   diclofenac Sodium (VOLTAREN) 1 % topical gel 2 g  2 g Topical BID Swayze, Ava, DO   2 g at 01/31/24 2159   hydrALAZINE (APRESOLINE) injection 10 mg  10 mg Intravenous Q2H PRN Swayze, Ava, DO   10 mg at 01/31/24 1725   LORazepam (ATIVAN)  injection 1-2 mg  1-2 mg Intravenous Q4H PRN Swayze, Ava, DO   2 mg at 01/31/24 1026   ondansetron (ZOFRAN) injection 4 mg  4 mg Intravenous Q8H PRN Lorretta Harp, MD       pantoprazole (PROTONIX) injection 40 mg  40 mg Intravenous Q12H Lorretta Harp, MD   40 mg at 02/01/24 0845   propranolol (INDERAL) tablet 5 mg  5 mg Oral TID Lorretta Harp, MD   5 mg at 02/01/24 0841   senna-docusate (Senokot-S) tablet 1 tablet  1 tablet Oral QHS PRN Swayze, Ava, DO       tamsulosin (FLOMAX) capsule 0.4 mg  0.4 mg Oral QPC supper Lorretta Harp, MD   0.4 mg at 01/31/24 1722   zinc oxide 20 % ointment 1 Application  1 Application Topical TID Swayze, Ava, DO   1 Application  at 02/01/24 0981     Discharge Medications: Please see discharge summary for a list of discharge medications.  Relevant Imaging Results:  Relevant Lab Results:   Additional Information SS #: [id number]  Trace Wirick E Jaime Dome, [name]

## 2024-02-01 NOTE — TOC Initial Note (Signed)
 Transition of Care [facility]) - Initial/Assessment Note    Patient Details  Name: [name] MRN: [id number] Date of Birth: Mar 15, 1941  Transition of Care [facility]) CM/SW Contact:    [name], [name] Phone Number: 02/01/2024, 10:39 AM  Clinical Narrative:                 Patient is from [facility] long term care, [name] in Admissions confirmed that patient can return when medically ready.  Expected Discharge Plan: Skilled Nursing Facility Barriers to Discharge: Continued Medical Work up   Patient Goals and CMS Choice            Expected Discharge Plan and Services       Living arrangements for the past 2 months: Skilled Nursing Facility                                      Prior Living Arrangements/Services Living arrangements for the past 2 months: Skilled Nursing Facility Lives with:: Facility Resident                   Activities of Daily Living      Permission Sought/Granted                  Emotional Assessment              Admission diagnosis:  Bright red blood per rectum [K62.5] Rectal bleeding [K62.5] Lower GI bleed [K92.2] Patient Active Problem List   Diagnosis Date Noted   Bright red blood per rectum 01/31/2024   Rectal bleeding 01/30/2024   Acute blood loss anemia 01/30/2024   Hypothermia 01/30/2024   HLD (hyperlipidemia) 01/30/2024   Ambulatory dysfunction    Agitation due to dementia (HCC) 06/11/2021   Stroke (HCC) 06/09/2021   Acute ischemic left MCA stroke (HCC) 06/09/2021   Middle cerebral artery embolism, left 06/09/2021   Acute ischemic stroke (HCC)    Memory loss 05/28/2021   Dementia without behavioral disturbance (HCC) 05/28/2021   Neuroleptic malignant syndrome 04/01/2021   History of CVA with residual deficit 03/30/2021   Acute confusion 03/29/2021   Delirium 03/29/2021   Cellulitis 03/29/2021   Chronic venous insufficiency 10/14/2016   Atrial fibrillation (HCC) 06/15/2015   Rash 06/15/2015    HTN (hypertension) 04/11/2015   Osteoarthritis 04/11/2015   PCP:  Lurlean Nanny [facility]:   [facility] - [place], [place] - [address] [place] [place] [address] Phone: [phone] Fax: [phone]     Social Drivers of Health (SDOH) Social History: SDOH Screenings   Food Insecurity: No Food Insecurity (04/26/2021)  Housing: Low Risk  (04/26/2021)  Transportation Needs: No Transportation Needs (04/26/2021)  Depression (PHQ2-9): Low Risk  (05/24/2021)  Financial Resource Strain: Low Risk  (08/27/2018)  Physical Activity: Inactive (08/27/2018)  Social Connections: Moderately Isolated (08/27/2018)  Stress: No Stress Concern Present (08/27/2018)  Tobacco Use: Medium Risk (01/30/2024)   SDOH Interventions:     Readmission Risk Interventions     No data to display

## 2024-02-01 NOTE — Plan of Care (Signed)
  Problem: Clinical Measurements: Goal: Ability to maintain clinical measurements within normal limits will improve Outcome: Progressing Goal: Will remain free from infection Outcome: Progressing Goal: Diagnostic test results will improve Outcome: Progressing Goal: Respiratory complications will improve Outcome: Progressing Goal: Cardiovascular complication will be avoided Outcome: Progressing   Problem: Pain Managment: Goal: General experience of comfort will improve and/or be controlled 02/01/2024 0131 by Dorthula Nettles, RN Outcome: Progressing 02/01/2024 0130 by Dorthula Nettles, RN Outcome: Progressing   Problem: Safety: Goal: Ability to remain free from injury will improve Outcome: Progressing   Problem: Skin Integrity: Goal: Risk for impaired skin integrity will decrease Outcome: Progressing   Problem: Education: Goal: Knowledge of General Education information will improve Description: Including pain rating scale, medication(s)/side effects and non-pharmacologic comfort measures 02/01/2024 0131 by Dorthula Nettles, RN Outcome: Not Progressing 02/01/2024 0130 by Dorthula Nettles, RN Outcome: Progressing   Problem: Health Behavior/Discharge Planning: Goal: Ability to manage health-related needs will improve 02/01/2024 0131 by Dorthula Nettles, RN Outcome: Not Progressing 02/01/2024 0130 by Dorthula Nettles, RN Outcome: Progressing   Problem: Activity: Goal: Risk for activity intolerance will decrease 02/01/2024 0131 by Dorthula Nettles, RN Outcome: Not Progressing 02/01/2024 0130 by Dorthula Nettles, RN Outcome: Progressing   Problem: Nutrition: Goal: Adequate nutrition will be maintained 02/01/2024 0131 by Dorthula Nettles, RN Outcome: Not Progressing 02/01/2024 0130 by Dorthula Nettles, RN Outcome: Progressing   Problem: Coping: Goal: Level of anxiety will decrease 02/01/2024 0131 by Dorthula Nettles, RN Outcome: Not Progressing 02/01/2024 0130 by Dorthula Nettles, RN Outcome: Progressing    Problem: Elimination: Goal: Will not experience complications related to bowel motility 02/01/2024 0131 by Dorthula Nettles, RN Outcome: Not Progressing 02/01/2024 0130 by Dorthula Nettles, RN Outcome: Progressing Goal: Will not experience complications related to urinary retention 02/01/2024 0131 by Dorthula Nettles, RN Outcome: Not Progressing 02/01/2024 0130 by Dorthula Nettles, RN Outcome: Progressing

## 2024-02-01 NOTE — Progress Notes (Signed)
 Progress Note   Patient: [name] ZOX:[id number] DOB: 1940-11-23 DOA: 01/30/2024     1 DOS: the patient was seen and examined on 02/01/2024   Brief hospital course: The patient is a 83 yr old woman who presents from [facility] via EMS due to staff concerns that she was having vaginal bleeding.   The patient has a significant medical history significant for f HTN, HLD, hx of CVA, dementia, PAF on chronic anticoagulation, and hx of malignant neuroleptic syndrome.   The patient was seen with blood dripping down her leg. Upon examination by EDP it was found that the patient was having rectal bleeding. Her hemoglobin upon arrival was 10.5 It has remained stable at 12.2 on the afternoon of 01/31/2024. GI was consulted. They have discussed the patient with the family. The family does not desire any invasive procedures. DDx per GI included internal hemorrhoids, colorectal neoplasm, colitis, AVM's, self limited diverticular bleed, ulcer, etc. The patient is on chronic anticoagulation for stroke prophylaxis due to atrial fibrillation.   GI recommends continuing to monitor the patient for signs of bleeding. Continue to hold anticoagulation until the morning of 02/01/2024. Avoid NSAIDS, They will reconsider invasive procedure such as colonoscopy should the patient experience an acute drop in hemoglobin. Her diet will be advanced as tolerated. GI has signed off.  While awaiting a bed upstairs the patient became acutely and severely agitated in the ED. She was given a one time dose of ativan 1 mg. While effective, this was short-lived. The patient has had a previous neuroleptic malignant syndrome reaction to antipsychotics. When the patient again became severely agitated. She will receive Ativan 1-2 mg IV q 4 hours prn agitation.   [facility] is ready to take her back when medically ready. I anticipate that this will be tomorrow.  Assessment and Plan:  Rectal bleeding and acute blood loss anemia: Hgb  11.6 on 7/39/24 --> 10.5 --> 9.7. consulted Dr. Norma Fredrickson of GI.  - The patient is admitted to a telemetry bed as an inpatient. --Restart xarelto today as per GI recommendation. Monitor Hgb. --Her diet has been advanced to a full liquid diet. Will advance as tolerated as per GI recommendations. - Change IV pantoprazole to oral - Zofran IV for nausea - Avoid NSAIDs and SQ heparin - Maintain IV access (2 large bore IVs if possible). - Monitor closely and follow q6h cbc, transfuse as necessary, if Hgb<7.0 --Stop IV fluids .   Hypothermia -Resolved.    Atrial fibrillation (HCC): HR 55-83s -Propranolol dose was reduced from 10 to 5 mg 3 times daily. -Xarelto will be restarted.   HTN (hypertension) -The patient is normotensive on propranolol and hydralazine.   HLD (hyperlipidemia) -Lipitor   History of CVA with residual deficit -Lipitor   Dementia without behavioral disturbance [facility]): Patient is, currently. Patient cannot tolerate Haldol due to history of malignant neuroleptic syndrome. --Use Ativan for agitation. -Fall precaution   I have seen and examined this patient myself. I have spent 32 minutes in her evaluation and care.     DVT ppx: SCD   Code Status: DNR per her son   Family Communication:    None available.       Disposition Plan:  Anticipate discharge back to previous environment, SNF   Consults called:  Dr. Norma Fredrickson of GI is consulted      Subjective: The patient is somnolent following having been medicated with ativan.   Physical Exam: Vitals:   01/31/24 2155 02/01/24  0000 02/01/24 0109 02/01/24 0915  BP: (!) 143/70 (!) 155/73 (!) 158/89 (!) 155/118  Pulse: (!) 58 (!) 42 (!) 57 97  Resp: 14 18 18 16   Temp:  98 F (36.7 C) 98.2 F (36.8 C) 98.1 F (36.7 C)  TempSrc:  Axillary  Oral  SpO2: 98% 94%  95%   Exam:  Constitutional:  The patient is awake, alert, and confused.  Respiratory:  No increased work of breathing. No wheezes, rales, or  rhonchi No tactile fremitus Cardiovascular:  Regular rate and rhythm No murmurs, ectopy, or gallups. No lateral PMI. No thrills. Abdomen:  Abdomen is soft, non-tender, non-distended No hernias, masses, or organomegaly Normoactive bowel sounds.  Musculoskeletal:  No cyanosis, clubbing, or edema Skin:  No rashes, lesions, ulcers palpation of skin: no induration or nodules Neurologic:  Unable to evaluate as the patient is unable to cooperate with exam. Psychiatric:  Unable to evaluate as the patient is unable to cooperate with exam.  Family Communication: None available  Disposition: Status is: Inpatient Remains inpatient appropriate because: Need for close monitoring of hemoglobin and vitals. Need for management of her severe agitation with IV benzodiazepines.  Planned Discharge Destination: Skilled nursing facility    Time spent: 32 minutes  Author: Tyriana Helmkamp, DO 02/01/2024 1:42 PM  For on call review [email or web address].

## 2024-02-02 DIAGNOSIS — K625 Hemorrhage of anus and rectum: Secondary | ICD-10-CM | POA: Diagnosis not present

## 2024-02-02 LAB — CBC
HCT: 36.8 % (ref 36.0–46.0)
Hemoglobin: 12.5 g/dL (ref 12.0–15.0)
MCH: 28.5 pg (ref 26.0–34.0)
MCHC: 34 g/dL (ref 30.0–36.0)
MCV: 84 fL (ref 80.0–100.0)
Platelets: 373 10*3/uL (ref 150–400)
RBC: 4.38 MIL/uL (ref 3.87–5.11)
RDW: 14.3 % (ref 11.5–15.5)
WBC: 5.9 10*3/uL (ref 4.0–10.5)
nRBC: 0 % (ref 0.0–0.2)

## 2024-02-02 MED ORDER — PANTOPRAZOLE SODIUM 40 MG PO TBEC: 40.0000 mg | DELAYED_RELEASE_TABLET | Freq: Every day | ORAL | 0 refills | Status: AC

## 2024-02-02 MED ORDER — PROPRANOLOL HCL 10 MG PO TABS: 5.0000 mg | ORAL_TABLET | Freq: Three times a day (TID) | ORAL | 0 refills | Status: AC

## 2024-02-02 NOTE — Progress Notes (Signed)
 Pt going back to [facility]. The writer called and gave report to intake nurse ( Crystal, LPN). Pt denies any pain or discomfort at this time. Will continue to monitor until discharge.

## 2024-02-02 NOTE — TOC Progression Note (Signed)
 Transition of Care [facility]) - Progression Note    Patient Details  Name: [name] MRN: [id number] Date of Birth: Sep 24, 1941  [facility]) CM/SW Contact  Marlowe Sax, RN Phone Number: 02/02/2024, 3:29 PM  Clinical Narrative:    Attempted to reach son and deiree, unable to reach, the patient will return to [facility] room 213B,, EMS called   Expected Discharge Plan: Skilled Nursing Facility Barriers to Discharge: Continued Medical Work up  Expected Discharge Plan and Services       Living arrangements for the past 2 months: Skilled Nursing Facility Expected Discharge Date: 02/02/24                                     Social Determinants of Health (SDOH) Interventions SDOH Screenings   Food Insecurity: Patient Unable To Answer (02/01/2024)  Housing: Patient Unable To Answer (02/01/2024)  Transportation Needs: Patient Unable To Answer (02/01/2024)  Utilities: Patient Unable To Answer (02/01/2024)  Depression (PHQ2-9): Low Risk  (05/24/2021)  Financial Resource Strain: Low Risk  (08/27/2018)  Physical Activity: Inactive (08/27/2018)  Social Connections: Patient Unable To Answer (02/01/2024)  Stress: No Stress Concern Present (08/27/2018)  Tobacco Use: Medium Risk (01/30/2024)    Readmission Risk Interventions     No data to display

## 2024-02-02 NOTE — Plan of Care (Signed)

## 2024-02-02 NOTE — Plan of Care (Signed)
  Problem: Clinical Measurements: Goal: Will remain free from infection Outcome: Progressing   Problem: Education: Goal: Knowledge of General Education information will improve Description: Including pain rating scale, medication(s)/side effects and non-pharmacologic comfort measures Outcome: Not Applicable

## 2024-02-02 NOTE — Discharge Summary (Signed)
 Physician Discharge Summary   Patient: [name] MRN: [id number] DOB: 26-Sep-1941  Admit date:     01/30/2024  Discharge date: 02/02/24  Discharge Physician: Fran Lowes   PCP: Nicholes Rough [facility]   Recommendations at discharge:    Discharge to long term care. Follow up with psychiatry withing one month Follow up with PCP in 7-10 days.  Discharge Diagnoses: Principal Problem:   Rectal bleeding Active Problems:   Acute blood loss anemia   Hypothermia   Atrial fibrillation (HCC)   HTN (hypertension)   HLD (hyperlipidemia)   History of CVA with residual deficit   Dementia without behavioral disturbance (HCC)   Bright red blood per rectum  Resolved Problems:   * No resolved hospital problems. [facility] Course: The patient is a 83 yr old woman who presents from [facility] via EMS due to staff concerns that she was having vaginal bleeding.    The patient has a significant medical history significant for f HTN, HLD, hx of CVA, dementia, PAF on chronic anticoagulation, and hx of malignant neuroleptic syndrome.    The patient was seen with blood dripping down her leg. Upon examination by EDP it was found that the patient was having rectal bleeding. Her hemoglobin upon arrival was 10.5 It has remained stable at 12.2 on the afternoon of 01/31/2024. GI was consulted. They have discussed the patient with the family. The family does not desire any invasive procedures. DDx per GI included internal hemorrhoids, colorectal neoplasm, colitis, AVM's, self limited diverticular bleed, ulcer, etc. The patient is on chronic anticoagulation for stroke prophylaxis due to atrial fibrillation.    GI recommends continuing to monitor the patient for signs of bleeding. Continue to hold anticoagulation until the morning of 02/01/2024. Avoid NSAIDS, They will reconsider invasive procedure such as colonoscopy should the patient experience an acute drop in hemoglobin. Her diet will be advanced as  tolerated. GI has signed off.   While awaiting a bed upstairs the patient became acutely and severely agitated in the ED. She was given a one time dose of ativan 1 mg. While effective, this was short-lived. The patient has had a previous neuroleptic malignant syndrome reaction to antipsychotics. When the patient again became severely agitated. She will receive Ativan 1-2 mg IV q 4 hours prn agitation.    The patient is medically appropriate for discharge to [facility] today.  Assessment and Plan: Constitutional:  The patient is awake, alert, and confused.  Respiratory:  No increased work of breathing. No wheezes, rales, or rhonchi No tactile fremitus Cardiovascular:  Regular rate and rhythm No murmurs, ectopy, or gallups. No lateral PMI. No thrills. Abdomen:  Abdomen is soft, non-tender, non-distended No hernias, masses, or organomegaly Normoactive bowel sounds.  Musculoskeletal:  No cyanosis, clubbing, or edema Skin:  No rashes, lesions, ulcers palpation of skin: no induration or nodules Neurologic:  Unable to evaluate as the patient is unable to cooperate with exam. Psychiatric:  Unable to evaluate as the patient is unable to cooperate with exam.        Consultants: Psychiatry Procedures performed: None  Disposition: Long term care facility Diet recommendation:  Discharge Diet Orders (From admission, onward)     Start     Ordered   02/02/24 0000  Diet - low sodium heart healthy        02/02/24 1410           Cardiac diet DISCHARGE MEDICATION: Allergies as of 02/02/2024  Reactions   Amlodipine Other (See Comments)   Leg swelling   Haldol [haloperidol Lactate] Other (See Comments)   Neuroleptic Malignant syndrome   Hctz [hydrochlorothiazide] Nausea Only, Rash        Medication List     TAKE these medications    acetaminophen 325 MG tablet Commonly known as: TYLENOL Take 2 tablets (650 mg total) by mouth every 4 (four) hours as needed  for mild pain (or temp > 37.5 C (99.5 F)).   atorvastatin 10 MG tablet Commonly known as: LIPITOR Take 10 mg by mouth at bedtime.   diclofenac Sodium 1 % Gel Commonly known as: VOLTAREN Apply 2 g topically 2 (two) times daily.   guaiFENesin 100 MG/5ML liquid Commonly known as: ROBITUSSIN Take 10 mLs by mouth every 4 (four) hours as needed for cough or to loosen phlegm.   pantoprazole 40 MG tablet Commonly known as: PROTONIX Take 1 tablet (40 mg total) by mouth daily. Start taking on: February 03, 2024   propranolol 10 MG tablet Commonly known as: INDERAL Take 0.5 tablets (5 mg total) by mouth 3 (three) times daily. What changed: how much to take   rivaroxaban 20 MG Tabs tablet Commonly known as: Xarelto Take 1 tablet (20 mg total) by mouth daily with supper.   senna-docusate 8.6-50 MG tablet Commonly known as: Senokot-S Take 1 tablet by mouth at bedtime as needed for moderate constipation or mild constipation.   tamsulosin 0.4 MG Caps capsule Commonly known as: FLOMAX Take 1 capsule (0.4 mg total) by mouth daily after supper.   zinc oxide 20 % ointment Apply 1 Application topically 3 (three) times daily. Apply to buttocks and sacrum every shift.        Discharge Exam: Filed Weights   02/01/24 1400  Weight: 76.1 kg   Exam:  Constitutional:  The patient is awake, alert, and confused. No acute distress. Respiratory:  No increased work of breathing. No wheezes, rales, or rhonchi No tactile fremitus Cardiovascular:  Regular rate and rhythm No murmurs, ectopy, or gallups. No lateral PMI. No thrills. Abdomen:  Abdomen is soft, non-tender, non-distended No hernias, masses, or organomegaly Normoactive bowel sounds.  Musculoskeletal:  No cyanosis, clubbing, or edema Skin:  No rashes, lesions, ulcers palpation of skin: no induration or nodules Neurologic:  CN 2-12 intact Sensation all 4 extremities intact Psychiatric:  Mental status Mood, affect  appropriate Orientation to person, place, time  judgment and insight appear intact   Condition at discharge: fair  The results of significant diagnostics from this hospitalization (including imaging, microbiology, ancillary and laboratory) are listed below for reference.   Imaging Studies: No results found.  Microbiology: Results for orders placed or performed during the hospital encounter of 06/27/22  SARS Coronavirus 2 by RT PCR (hospital order, performed in [facility] hospital lab) *cepheid single result test* Anterior Nasal Swab     Status: None   Collection Time: 06/27/22 11:07 AM   Specimen: Anterior Nasal Swab  Result Value Ref Range Status   SARS Coronavirus 2 by RT PCR NEGATIVE NEGATIVE Final    Comment: (NOTE) SARS-CoV-2 target nucleic acids are NOT DETECTED.  The SARS-CoV-2 RNA is generally detectable in upper and lower respiratory specimens during the acute phase of infection. The lowest concentration of SARS-CoV-2 viral copies this assay can detect is 250 copies / mL. A negative result does not preclude SARS-CoV-2 infection and should not be used as the sole basis for treatment or other patient management decisions.  A negative  result may occur with improper specimen collection / handling, submission of specimen other than nasopharyngeal swab, presence of viral mutation(s) within the areas targeted by this assay, and inadequate number of viral copies (<250 copies / mL). A negative result must be combined with clinical observations, patient history, and epidemiological information.  Fact Sheet for Patients:   RoadLapTop.co.za  Fact Sheet for Healthcare Providers: http://kim-miller.com/  This test is not yet approved or  cleared by the [place] FDA and has been authorized for detection and/or diagnosis of SARS-CoV-2 by FDA under an Emergency Use Authorization (EUA).  This EUA will remain in effect (meaning this  test can be used) for the duration of the COVID-19 declaration under Section 564(b)(1) of the Act, 21 U.S.C. section 360bbb-3(b)(1), unless the authorization is terminated or revoked sooner.  Performed at [facility], [address] [place], [place] [address]     Labs: CBC: Recent Labs  Lab 01/30/24 1808 01/30/24 2100 01/31/24 0325 01/31/24 0801 01/31/24 1352 02/01/24 0421 02/02/24 0933  WBC 6.2   < > 5.6 5.4 5.7 4.9 5.9  NEUTROABS 3.2  --   --   --   --   --   --   HGB 10.5*   < > 9.5* 11.0* 12.2 12.7 12.5  HCT 32.6*   < > 29.1* 33.3* 37.6 39.3 36.8  MCV 89.6   < > 87.7 87.2 88.1 87.9 84.0  PLT 371   < > 323 347 366 358 373   < > = values in this interval not displayed.   Basic Metabolic Panel: Recent Labs  Lab 01/30/24 1808 01/31/24 0801 02/01/24 0421  NA 143 139 138  K 4.4 3.6 3.9  CL 109 107 107  CO2 27 24 21*  GLUCOSE 93 82 78  BUN 24* 18 13  CREATININE 0.61 0.66 0.59  CALCIUM 8.7* 8.7* 8.9   Liver Function Tests: Recent Labs  Lab 01/30/24 1808  AST 14*  ALT 10  ALKPHOS 81  BILITOT 0.5  PROT 7.1  ALBUMIN 3.7   CBG: No results for input(s): "GLUCAP" in the last 168 hours.  Discharge time spent: greater than 30 minutes.  Signed: Fran Lowes, DO [facility] 02/02/2024

## 2024-02-03 DIAGNOSIS — I4891 Unspecified atrial fibrillation: Secondary | ICD-10-CM | POA: Diagnosis not present

## 2024-02-03 DIAGNOSIS — K625 Hemorrhage of anus and rectum: Secondary | ICD-10-CM | POA: Diagnosis not present

## 2024-02-04 DIAGNOSIS — F0150 Vascular dementia without behavioral disturbance: Secondary | ICD-10-CM | POA: Diagnosis not present

## 2024-02-04 DIAGNOSIS — I10 Essential (primary) hypertension: Secondary | ICD-10-CM | POA: Diagnosis not present

## 2024-02-04 DIAGNOSIS — I639 Cerebral infarction, unspecified: Secondary | ICD-10-CM | POA: Diagnosis not present

## 2024-02-04 DIAGNOSIS — E785 Hyperlipidemia, unspecified: Secondary | ICD-10-CM | POA: Diagnosis not present

## 2024-02-04 DIAGNOSIS — K625 Hemorrhage of anus and rectum: Secondary | ICD-10-CM | POA: Diagnosis not present

## 2024-02-04 DIAGNOSIS — R339 Retention of urine, unspecified: Secondary | ICD-10-CM | POA: Diagnosis not present

## 2024-02-04 DIAGNOSIS — I4891 Unspecified atrial fibrillation: Secondary | ICD-10-CM | POA: Diagnosis not present

## 2024-02-09 DIAGNOSIS — J069 Acute upper respiratory infection, unspecified: Secondary | ICD-10-CM | POA: Diagnosis not present

## 2024-02-09 DIAGNOSIS — R0989 Other specified symptoms and signs involving the circulatory and respiratory systems: Secondary | ICD-10-CM | POA: Diagnosis not present

## 2024-02-09 DIAGNOSIS — J101 Influenza due to other identified influenza virus with other respiratory manifestations: Secondary | ICD-10-CM | POA: Diagnosis not present

## 2024-02-10 DIAGNOSIS — R051 Acute cough: Secondary | ICD-10-CM | POA: Diagnosis not present

## 2024-02-10 DIAGNOSIS — I517 Cardiomegaly: Secondary | ICD-10-CM | POA: Diagnosis not present

## 2024-02-16 DIAGNOSIS — J101 Influenza due to other identified influenza virus with other respiratory manifestations: Secondary | ICD-10-CM | POA: Diagnosis not present

## 2024-03-05 DIAGNOSIS — N390 Urinary tract infection, site not specified: Secondary | ICD-10-CM | POA: Diagnosis not present

## 2024-03-17 DIAGNOSIS — Z79899 Other long term (current) drug therapy: Secondary | ICD-10-CM | POA: Diagnosis not present

## 2024-04-28 DIAGNOSIS — E785 Hyperlipidemia, unspecified: Secondary | ICD-10-CM | POA: Diagnosis not present

## 2024-04-28 DIAGNOSIS — I10 Essential (primary) hypertension: Secondary | ICD-10-CM | POA: Diagnosis not present

## 2024-04-28 DIAGNOSIS — F0150 Vascular dementia without behavioral disturbance: Secondary | ICD-10-CM | POA: Diagnosis not present

## 2024-04-28 DIAGNOSIS — K5900 Constipation, unspecified: Secondary | ICD-10-CM | POA: Diagnosis not present

## 2024-04-28 DIAGNOSIS — I4891 Unspecified atrial fibrillation: Secondary | ICD-10-CM | POA: Diagnosis not present

## 2024-04-28 DIAGNOSIS — M1990 Unspecified osteoarthritis, unspecified site: Secondary | ICD-10-CM | POA: Diagnosis not present

## 2024-04-28 DIAGNOSIS — R339 Retention of urine, unspecified: Secondary | ICD-10-CM | POA: Diagnosis not present

## 2024-06-24 DIAGNOSIS — E785 Hyperlipidemia, unspecified: Secondary | ICD-10-CM | POA: Diagnosis not present

## 2024-06-24 DIAGNOSIS — I4891 Unspecified atrial fibrillation: Secondary | ICD-10-CM | POA: Diagnosis not present

## 2024-06-24 DIAGNOSIS — F0150 Vascular dementia without behavioral disturbance: Secondary | ICD-10-CM | POA: Diagnosis not present

## 2024-06-24 DIAGNOSIS — Z7189 Other specified counseling: Secondary | ICD-10-CM | POA: Diagnosis not present

## 2024-06-25 DIAGNOSIS — R4701 Aphasia: Secondary | ICD-10-CM | POA: Diagnosis not present

## 2024-06-25 DIAGNOSIS — R41841 Cognitive communication deficit: Secondary | ICD-10-CM | POA: Diagnosis not present

## 2024-06-28 DIAGNOSIS — R41841 Cognitive communication deficit: Secondary | ICD-10-CM | POA: Diagnosis not present

## 2024-06-28 DIAGNOSIS — R4701 Aphasia: Secondary | ICD-10-CM | POA: Diagnosis not present

## 2024-06-29 DIAGNOSIS — R41841 Cognitive communication deficit: Secondary | ICD-10-CM | POA: Diagnosis not present

## 2024-06-29 DIAGNOSIS — R4701 Aphasia: Secondary | ICD-10-CM | POA: Diagnosis not present

## 2024-06-30 DIAGNOSIS — R41841 Cognitive communication deficit: Secondary | ICD-10-CM | POA: Diagnosis not present

## 2024-06-30 DIAGNOSIS — R4701 Aphasia: Secondary | ICD-10-CM | POA: Diagnosis not present

## 2024-07-01 DIAGNOSIS — R4701 Aphasia: Secondary | ICD-10-CM | POA: Diagnosis not present

## 2024-07-01 DIAGNOSIS — R41841 Cognitive communication deficit: Secondary | ICD-10-CM | POA: Diagnosis not present

## 2024-07-02 DIAGNOSIS — R4701 Aphasia: Secondary | ICD-10-CM | POA: Diagnosis not present

## 2024-07-02 DIAGNOSIS — R41841 Cognitive communication deficit: Secondary | ICD-10-CM | POA: Diagnosis not present

## 2024-07-05 DIAGNOSIS — R41841 Cognitive communication deficit: Secondary | ICD-10-CM | POA: Diagnosis not present

## 2024-07-05 DIAGNOSIS — R4701 Aphasia: Secondary | ICD-10-CM | POA: Diagnosis not present

## 2024-07-07 DIAGNOSIS — R4701 Aphasia: Secondary | ICD-10-CM | POA: Diagnosis not present

## 2024-07-07 DIAGNOSIS — R41841 Cognitive communication deficit: Secondary | ICD-10-CM | POA: Diagnosis not present

## 2024-07-08 DIAGNOSIS — R41841 Cognitive communication deficit: Secondary | ICD-10-CM | POA: Diagnosis not present

## 2024-07-08 DIAGNOSIS — R112 Nausea with vomiting, unspecified: Secondary | ICD-10-CM | POA: Diagnosis not present

## 2024-07-15 DIAGNOSIS — R112 Nausea with vomiting, unspecified: Secondary | ICD-10-CM | POA: Diagnosis not present

## 2024-07-15 DIAGNOSIS — L84 Corns and callosities: Secondary | ICD-10-CM | POA: Diagnosis not present

## 2024-07-15 DIAGNOSIS — L602 Onychogryphosis: Secondary | ICD-10-CM | POA: Diagnosis not present

## 2024-07-15 DIAGNOSIS — I739 Peripheral vascular disease, unspecified: Secondary | ICD-10-CM | POA: Diagnosis not present

## 2024-07-15 DIAGNOSIS — L603 Nail dystrophy: Secondary | ICD-10-CM | POA: Diagnosis not present

## 2024-07-15 DIAGNOSIS — M2041 Other hammer toe(s) (acquired), right foot: Secondary | ICD-10-CM | POA: Diagnosis not present

## 2024-08-05 DIAGNOSIS — F0150 Vascular dementia without behavioral disturbance: Secondary | ICD-10-CM | POA: Diagnosis not present

## 2024-08-05 DIAGNOSIS — Z23 Encounter for immunization: Secondary | ICD-10-CM | POA: Diagnosis not present

## 2024-08-17 DIAGNOSIS — E785 Hyperlipidemia, unspecified: Secondary | ICD-10-CM | POA: Diagnosis not present

## 2024-08-17 DIAGNOSIS — K5900 Constipation, unspecified: Secondary | ICD-10-CM | POA: Diagnosis not present

## 2024-08-17 DIAGNOSIS — F0150 Vascular dementia without behavioral disturbance: Secondary | ICD-10-CM | POA: Diagnosis not present

## 2024-08-17 DIAGNOSIS — I639 Cerebral infarction, unspecified: Secondary | ICD-10-CM | POA: Diagnosis not present

## 2024-08-17 DIAGNOSIS — M1990 Unspecified osteoarthritis, unspecified site: Secondary | ICD-10-CM | POA: Diagnosis not present

## 2024-08-17 DIAGNOSIS — I10 Essential (primary) hypertension: Secondary | ICD-10-CM | POA: Diagnosis not present

## 2024-08-17 DIAGNOSIS — I4891 Unspecified atrial fibrillation: Secondary | ICD-10-CM | POA: Diagnosis not present

## 2024-08-24 DIAGNOSIS — I4891 Unspecified atrial fibrillation: Secondary | ICD-10-CM | POA: Diagnosis not present

## 2024-08-24 DIAGNOSIS — R059 Cough, unspecified: Secondary | ICD-10-CM | POA: Diagnosis not present

## 2024-09-14 DIAGNOSIS — I10 Essential (primary) hypertension: Secondary | ICD-10-CM | POA: Diagnosis not present

## 2024-09-14 DIAGNOSIS — K5900 Constipation, unspecified: Secondary | ICD-10-CM | POA: Diagnosis not present

## 2024-09-14 DIAGNOSIS — I739 Peripheral vascular disease, unspecified: Secondary | ICD-10-CM | POA: Diagnosis not present

## 2024-09-14 DIAGNOSIS — L603 Nail dystrophy: Secondary | ICD-10-CM | POA: Diagnosis not present

## 2024-09-14 DIAGNOSIS — L84 Corns and callosities: Secondary | ICD-10-CM | POA: Diagnosis not present

## 2024-09-14 DIAGNOSIS — L602 Onychogryphosis: Secondary | ICD-10-CM | POA: Diagnosis not present

## 2024-10-09 DIAGNOSIS — I10 Essential (primary) hypertension: Secondary | ICD-10-CM | POA: Diagnosis not present

## 2024-10-09 DIAGNOSIS — I872 Venous insufficiency (chronic) (peripheral): Secondary | ICD-10-CM | POA: Diagnosis not present

## 2024-10-09 DIAGNOSIS — I69398 Other sequelae of cerebral infarction: Secondary | ICD-10-CM | POA: Diagnosis not present

## 2024-10-09 DIAGNOSIS — G210 Malignant neuroleptic syndrome: Secondary | ICD-10-CM | POA: Diagnosis not present

## 2024-10-09 DIAGNOSIS — E785 Hyperlipidemia, unspecified: Secondary | ICD-10-CM | POA: Diagnosis not present

## 2024-10-11 DIAGNOSIS — E785 Hyperlipidemia, unspecified: Secondary | ICD-10-CM | POA: Diagnosis not present

## 2024-10-11 DIAGNOSIS — I639 Cerebral infarction, unspecified: Secondary | ICD-10-CM | POA: Diagnosis not present

## 2024-10-13 DIAGNOSIS — I639 Cerebral infarction, unspecified: Secondary | ICD-10-CM | POA: Diagnosis not present

## 2024-10-13 DIAGNOSIS — M1990 Unspecified osteoarthritis, unspecified site: Secondary | ICD-10-CM | POA: Diagnosis not present

## 2024-10-13 DIAGNOSIS — K5900 Constipation, unspecified: Secondary | ICD-10-CM | POA: Diagnosis not present

## 2024-10-13 DIAGNOSIS — I10 Essential (primary) hypertension: Secondary | ICD-10-CM | POA: Diagnosis not present

## 2024-10-13 DIAGNOSIS — E785 Hyperlipidemia, unspecified: Secondary | ICD-10-CM | POA: Diagnosis not present

## 2024-10-14 ENCOUNTER — Ambulatory Visit

## 2024-10-14 DIAGNOSIS — L219 Seborrheic dermatitis, unspecified: Secondary | ICD-10-CM | POA: Diagnosis not present

## 2024-10-14 DIAGNOSIS — L603 Nail dystrophy: Secondary | ICD-10-CM | POA: Diagnosis not present

## 2024-10-14 DIAGNOSIS — L601 Onycholysis: Secondary | ICD-10-CM | POA: Diagnosis not present

## 2024-10-14 DIAGNOSIS — B351 Tinea unguium: Secondary | ICD-10-CM | POA: Diagnosis not present

## 2024-10-14 MED ORDER — CLOBETASOL PROPIONATE 0.05 % EX SOLN: CUTANEOUS | 1 refills | Status: AC

## 2024-10-14 MED ORDER — CICLOPIROX 8 % EX SOLN: Freq: Every day | CUTANEOUS | 11 refills | Status: AC

## 2024-10-14 NOTE — Progress Notes (Signed)
°  °  Subjective   [name] is a 83 y.o. female who presents for the following: nail issues. Patient is new patient  Today patient reports: Patient accompanied by daughter in law who contributes to history. Yellow discoloration of the nails and fingernails that fall off, daughter in law reports that patient also picks and pulls her fingernails off, and also has a fungal infection of the feet and toenails. Daughter in law has noticed patient scratching her scalp and neck and she is concerned she may have psoriasis.  Review of Systems:    No other skin or systemic complaints except as noted in HPI or Assessment and Plan.  The following portions of the chart were reviewed this encounter and updated as appropriate: medications, allergies, medical history  Relevant Medical History:  n/a   Objective  (SKPE) Well appearing patient in no apparent distress; mood and affect are within normal limits. Examination was performed of the: Focused Exam of: the hands and fingernails   Examination notable for: ---, Seborrheic Dermatitis: Erythema and scaling of the scalp, ear canals, and central face > rest of face. Nail dystrophy with yellowing, onycholysis  Examination limited by: Shoes or socks      Assessment & Plan  (SKAP)   Nail dystrophy with onychomycosis and onycholysis  - start [place] nail lacquer QHS and keep nails trimmed.   Seborrheic dermatitis Chronic and persistent condition with duration or expected duration over one year. Condition is symptomatic and bothersome to patient. Patient is flaring and not currently at treatment goal.  - Discussed diagnosis, typical course, and treatment options for this condition - Explained to the patient the chronic nature of this diagnosis - Start clobetasol 0.05% solution to affect areas a needed  - Consider alternating with use of Head and Shoulders or Selsun Blue anti-dandruff shampoo which contains zinc  pyrithione 1%  Level of service outlined  above   Patient instructions (SKPI)   Procedures, orders, diagnosis for this visit:    There are no diagnoses linked to this encounter.  Return to clinic: Return if symptoms worsen or fail to improve.  LILLETTE Rosina Mayans, CMA, am acting as scribe for Lauraine JAYSON Kanaris, MD .   Documentation: I have reviewed the above documentation for accuracy and completeness, and I agree with the above.  Lauraine JAYSON Kanaris, MD

## 2024-10-14 NOTE — Patient Instructions (Signed)
